# Patient Record
Sex: Female | Born: 1970
Health system: Southern US, Community
[De-identification: ages and names within clinical notes are randomized; demographics above are authoritative.]

## PROBLEM LIST (undated history)

## (undated) ENCOUNTER — Emergency Department (HOSPITAL_COMMUNITY): Admission: EM | Payer: Commercial Managed Care - PPO

## (undated) DIAGNOSIS — Z8639 Personal history of other endocrine, nutritional and metabolic disease: Secondary | ICD-10-CM

## (undated) DIAGNOSIS — F32A Depression, unspecified: Secondary | ICD-10-CM

## (undated) DIAGNOSIS — C786 Secondary malignant neoplasm of retroperitoneum and peritoneum: Secondary | ICD-10-CM

## (undated) DIAGNOSIS — E119 Type 2 diabetes mellitus without complications: Secondary | ICD-10-CM

## (undated) DIAGNOSIS — J189 Pneumonia, unspecified organism: Secondary | ICD-10-CM

## (undated) DIAGNOSIS — F419 Anxiety disorder, unspecified: Secondary | ICD-10-CM

## (undated) DIAGNOSIS — G4730 Sleep apnea, unspecified: Secondary | ICD-10-CM

## (undated) DIAGNOSIS — I10 Essential (primary) hypertension: Secondary | ICD-10-CM

## (undated) DIAGNOSIS — J069 Acute upper respiratory infection, unspecified: Secondary | ICD-10-CM

## (undated) DIAGNOSIS — Z8052 Family history of malignant neoplasm of bladder: Secondary | ICD-10-CM

## (undated) DIAGNOSIS — Z803 Family history of malignant neoplasm of breast: Secondary | ICD-10-CM

## (undated) DIAGNOSIS — I499 Cardiac arrhythmia, unspecified: Secondary | ICD-10-CM

## (undated) DIAGNOSIS — R0600 Dyspnea, unspecified: Secondary | ICD-10-CM

## (undated) DIAGNOSIS — C801 Malignant (primary) neoplasm, unspecified: Secondary | ICD-10-CM

## (undated) DIAGNOSIS — R000 Tachycardia, unspecified: Secondary | ICD-10-CM

## (undated) HISTORY — PX: KNEE SURGERY: SHX244

## (undated) HISTORY — DX: Personal history of other endocrine, nutritional and metabolic disease: Z86.39

## (undated) HISTORY — PX: TONSILLECTOMY AND ADENOIDECTOMY: SHX28

## (undated) HISTORY — DX: Essential (primary) hypertension: I10

## (undated) HISTORY — DX: Family history of malignant neoplasm of bladder: Z80.52

## (undated) HISTORY — DX: Type 2 diabetes mellitus without complications: E11.9

## (undated) HISTORY — DX: Family history of malignant neoplasm of breast: Z80.3

## (undated) HISTORY — DX: Secondary malignant neoplasm of retroperitoneum and peritoneum: C78.6

## (undated) HISTORY — DX: Malignant (primary) neoplasm, unspecified: C80.1

## (undated) MED FILL — Dexamethasone Sodium Phosphate Inj 100 MG/10ML: INTRAMUSCULAR | Qty: 1 | Status: AC

## (undated) MED FILL — Fosaprepitant Dimeglumine For IV Infusion 150 MG (Base Eq): INTRAVENOUS | Qty: 5 | Status: AC

---
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
## 2007-07-18 ENCOUNTER — Ambulatory Visit: Admission: RE | Admit: 2007-07-18 | Discharge: 2007-07-18 | Payer: Self-pay | Admitting: Family Medicine

## 2010-10-17 NOTE — Procedures (Signed)
NAMECARLIYAH, Nicole Santos                   ACCOUNT NO.:  [id number]   MEDICAL RECORD NO.:  [id number]          PATIENT TYPE:  OUT   LOCATION:  SLEE                          FACILITY:  [facility]   PHYSICIAN:  Kofi A. Gerilyn Pilgrim, M.D. DATE OF BIRTH:  01-08-1971   DATE OF PROCEDURE:  07/18/2007  DATE OF DISCHARGE:  07/18/2007                             SLEEP DISORDER REPORT   POLYSOMNOGRAPHY REPORT   REFERRING PHYSICIAN:  Jeoffrey Massed, M.D.   HISTORY:  This is a 40 year old lady who is being evaluated for  obstructive sleep apnea syndrome. Epworth sleepiness scale 9.   MEDICATIONS:  1. Aspirin.  2. Ranitidine.  3. Lisinopril.   SLEEP SUMMARY:  The total recording time is 428 minutes. Total sleep  time 368 minutes with a sleep efficiency of 86%. Stage N1 2.7%, stage N2  46.7%, stage N3 29%, and stage REM 21%.   RESPIRATORY SUMMARY:  The baseline saturation is 97% with the lowest  saturation being 78%. The patient had an overall AHI of 20.9 with more  events seen during REM sleep and later part of the night. The REM AHI is  65 while the non-REM AHI is 8.9.   LIMB MOVEMENT SUMMARY:  The PLM index is 4.6.   ELECTROCARDIOGRAM SUMMARY:  The mean heart rate is 89 with sinus  tachycardia noted.   IMPRESSION:  Moderate obstructive sleep apnea syndrome, worse during REM  sleep.   RECOMMENDATIONS:  Formal titration study to assess optimal CPAP  pressure.   Thanks for this referral.      Kofi A. Gerilyn Pilgrim, M.D.  Electronically Signed    KAD/MEDQ  D:  07/21/2007  T:  07/22/2007  Job:  [id number]

## 2010-10-17 NOTE — Procedures (Signed)
NAMETRACHELLE, LOW                   ACCOUNT NO.:  [id number]   MEDICAL RECORD NO.:  [id number]          PATIENT TYPE:  OUT   LOCATION:  SLEE                          FACILITY:  [facility]   PHYSICIAN:  Kofi A. Gerilyn Pilgrim, M.D. DATE OF BIRTH:  Feb 15, 1971   DATE OF PROCEDURE:  07/18/2007  DATE OF DISCHARGE:  07/18/2007                             SLEEP DISORDER REPORT   POLYSOMNOGRAPHY REPORT   REFERRING PHYSICIAN:  Jeoffrey Massed, M.D.   HISTORY:  This is a 40 year old lady who is being evaluated for  obstructive sleep apnea syndrome. Epworth sleepiness scale 9.   MEDICATIONS:  1. Aspirin.  2. Ranitidine.  3. Lisinopril.   SLEEP SUMMARY:  The total recording time is 428 minutes. Total sleep  time 368 minutes with a sleep efficiency of 86%. Stage N1 2.7%, stage N2  46.7%, stage N3 29%, and stage REM 21%.   RESPIRATORY SUMMARY:  The baseline saturation is 97% with the lowest  saturation being 78%. The patient had an overall AHI of 20.9 with more  events seen during REM sleep and later part of the night. The REM AHI is  65 while the non-REM AHI is 8.9.   LIMB MOVEMENT SUMMARY:  The PLM index is 4.6.   ELECTROCARDIOGRAM SUMMARY:  The mean heart rate is 89 with sinus  tachycardia noted.   IMPRESSION:  Moderate obstructive sleep apnea syndrome, worse during REM  sleep.   RECOMMENDATIONS:  Formal titration study to assess optimal CPAP  pressure.   Thanks for this referral.      Kofi A. Gerilyn Pilgrim, M.D.     KAD/MEDQ  D:  07/21/2007  T:  07/22/2007  Job:  [id number]

## 2012-08-19 ENCOUNTER — Telehealth: Payer: Self-pay | Admitting: Family Medicine

## 2012-08-19 NOTE — Telephone Encounter (Signed)
NEEDS X-RAYS TO PICK UP TO TAKE TO [facility]. BY 1:00 PM TODAY.

## 2013-08-12 ENCOUNTER — Encounter: Payer: Self-pay | Admitting: General Practice

## 2013-08-12 ENCOUNTER — Telehealth: Payer: Self-pay | Admitting: Nurse Practitioner

## 2013-08-12 ENCOUNTER — Encounter (INDEPENDENT_AMBULATORY_CARE_PROVIDER_SITE_OTHER): Payer: Self-pay

## 2013-08-12 ENCOUNTER — Ambulatory Visit (INDEPENDENT_AMBULATORY_CARE_PROVIDER_SITE_OTHER): Payer: 59 | Admitting: General Practice

## 2013-08-12 VITALS — BP 156/91 | HR 106 | Temp 99.7°F | Ht 62.0 in | Wt 265.0 lb

## 2013-08-12 DIAGNOSIS — R059 Cough, unspecified: Secondary | ICD-10-CM

## 2013-08-12 DIAGNOSIS — R05 Cough: Secondary | ICD-10-CM

## 2013-08-12 DIAGNOSIS — J209 Acute bronchitis, unspecified: Secondary | ICD-10-CM

## 2013-08-12 DIAGNOSIS — R062 Wheezing: Secondary | ICD-10-CM

## 2013-08-12 MED ORDER — AMOXICILLIN 500 MG PO CAPS: 500.0000 mg | ORAL_CAPSULE | Freq: Two times a day (BID) | ORAL | Status: DC

## 2013-08-12 MED ORDER — METHYLPREDNISOLONE ACETATE 80 MG/ML IJ SUSP
80.0000 mg | Freq: Once | INTRAMUSCULAR | Status: AC
  Administered 2013-08-12: 80 mg via INTRAMUSCULAR

## 2013-08-12 MED ORDER — PREDNISONE (PAK) 10 MG PO TABS: ORAL_TABLET | ORAL | Status: DC

## 2013-08-12 MED ORDER — BENZONATATE 100 MG PO CAPS: 100.0000 mg | ORAL_CAPSULE | Freq: Three times a day (TID) | ORAL | Status: DC | PRN

## 2013-08-12 MED ORDER — ALBUTEROL SULFATE (2.5 MG/3ML) 0.083% IN NEBU
2.5000 mg | INHALATION_SOLUTION | Freq: Once | RESPIRATORY_TRACT | Status: AC
  Administered 2013-08-12: 2.5 mg via RESPIRATORY_TRACT

## 2013-08-12 NOTE — Patient Instructions (Addendum)
Acute Bronchitis Bronchitis is inflammation of the airways that extend from the windpipe into the lungs (bronchi). The inflammation often causes mucus to develop. This leads to a cough, which is the most common symptom of bronchitis.  In acute bronchitis, the condition usually develops suddenly and goes away over time, usually in a couple weeks. Smoking, allergies, and asthma can make bronchitis worse. Repeated episodes of bronchitis may cause further lung problems.  CAUSES Acute bronchitis is most often caused by the same virus that causes a cold. The virus can spread from person to person (contagious).  SIGNS AND SYMPTOMS   Cough.   Fever.   Coughing up mucus.   Body aches.   Chest congestion.   Chills.   Shortness of breath.   Sore throat.  DIAGNOSIS  Acute bronchitis is usually diagnosed through a physical exam. Tests, such as chest X-rays, are sometimes done to rule out other conditions.  TREATMENT  Acute bronchitis usually goes away in a couple weeks. Often times, no medical treatment is necessary. Medicines are sometimes given for relief of fever or cough. Antibiotics are usually not needed but may be prescribed in certain situations. In some cases, an inhaler may be recommended to help reduce shortness of breath and control the cough. A cool mist vaporizer may also be used to help thin bronchial secretions and make it easier to clear the chest.  HOME CARE INSTRUCTIONS  Get plenty of rest.   Drink enough fluids to keep your urine clear or pale yellow (unless you have a medical condition that requires fluid restriction). Increasing fluids may help thin your secretions and will prevent dehydration.   Only take over-the-counter or prescription medicines as directed by your health care provider.   Avoid smoking and secondhand smoke. Exposure to cigarette smoke or irritating chemicals will make bronchitis worse. If you are a smoker, consider using nicotine gum or skin  patches to help control withdrawal symptoms. Quitting smoking will help your lungs heal faster.   Reduce the chances of another bout of acute bronchitis by washing your hands frequently, avoiding people with cold symptoms, and trying not to touch your hands to your mouth, nose, or eyes.   Follow up with your health care provider as directed.  SEEK MEDICAL CARE IF: Your symptoms do not improve after 1 week of treatment.  SEEK IMMEDIATE MEDICAL CARE IF:  You develop an increased fever or chills.   You have chest pain.   You have severe shortness of breath.  You have bloody sputum.   You develop dehydration.  You develop fainting.  You develop repeated vomiting.  You develop a severe headache. MAKE SURE YOU:   Understand these instructions.  Will watch your condition.  Will get help right away if you are not doing well or get worse. Document Released: 06/28/2004 Document Revised: 01/21/2013 Document Reviewed: 11/11/2012 [employer] Patient Information 2014 [employer].  

## 2013-08-12 NOTE — Telephone Encounter (Signed)
appt made

## 2013-08-14 NOTE — Progress Notes (Signed)
   Subjective:    Patient ID: Nicole Santos, female    DOB: 05-07-1971, 43 y.o.   MRN: [id number]  Cough This is a new problem. The current episode started in the past 7 days. The problem has been gradually worsening. The cough is non-productive. Associated symptoms include a fever and wheezing. Pertinent negatives include no chest pain, chills, nasal congestion, postnasal drip, sore throat or shortness of breath. The symptoms are aggravated by lying down. She has tried nothing for the symptoms. There is no history of asthma, bronchitis or pneumonia.      Review of Systems  Constitutional: Positive for fever. Negative for chills.  HENT: Negative for postnasal drip and sore throat.   Respiratory: Positive for cough and wheezing. Negative for shortness of breath.   Cardiovascular: Negative for chest pain and palpitations.       Objective:   Physical Exam  Constitutional: She appears well-developed and well-nourished.  Cardiovascular: Normal rate, regular rhythm and normal heart sounds.   Pulmonary/Chest: Effort normal. She has wheezes in the right upper field and the left upper field. She exhibits no tenderness.          Assessment & Plan:  1. Wheezing  - albuterol (PROVENTIL) (2.5 MG/3ML) 0.083% nebulizer solution 2.5 mg; Take 3 mLs (2.5 mg total) by nebulization once. -improvement in wheezing after neb treatment 2. Acute bronchitis  - methylPREDNISolone acetate (DEPO-MEDROL) injection 80 mg; Inject 1 mL (80 mg total) into the muscle once. - predniSONE (STERAPRED UNI-PAK) 10 MG tablet; Start on 08/13/13  Dispense: 21 tablet; Refill: 0  3. Cough  - benzonatate (TESSALON) 100 MG capsule; Take 1 capsule (100 mg total) by mouth 3 (three) times daily as needed.  Dispense: 30 capsule; Refill: 0 -avoid irritants -RTO if symptoms worsen or unresolved Patient verbalized understanding Erby Pian, FNP-C

## 2013-10-20 ENCOUNTER — Encounter: Payer: Self-pay | Admitting: Physician Assistant

## 2013-10-20 ENCOUNTER — Ambulatory Visit (INDEPENDENT_AMBULATORY_CARE_PROVIDER_SITE_OTHER): Payer: 59 | Admitting: Physician Assistant

## 2013-10-20 VITALS — BP 114/75 | HR 116 | Temp 99.1°F | Wt 269.8 lb

## 2013-10-20 DIAGNOSIS — I872 Venous insufficiency (chronic) (peripheral): Secondary | ICD-10-CM

## 2013-10-20 DIAGNOSIS — C539 Malignant neoplasm of cervix uteri, unspecified: Secondary | ICD-10-CM

## 2013-10-20 DIAGNOSIS — I8310 Varicose veins of unspecified lower extremity with inflammation: Secondary | ICD-10-CM

## 2013-10-20 MED ORDER — CEPHALEXIN 500 MG PO CAPS: 500.0000 mg | ORAL_CAPSULE | Freq: Two times a day (BID) | ORAL | Status: DC

## 2013-10-20 NOTE — Progress Notes (Signed)
Subjective:     Patient ID: Nicole Santos, female   DOB: 01/24/1971, 43 y.o.   MRN: [id number]  HPI Pt with redness to the L lower leg States this was preceded by vague complaints of general malaise Family concerned about cellulitis Rash is better today She has a job that requires her to stand all day Hx of DVT to the R leg following immobility for cervical Ca tx Denies any drainage from the site No new meds OTC or rx   Review of Systems  Respiratory: Negative for chest tightness, shortness of breath and wheezing.   Cardiovascular: Positive for leg swelling. Negative for chest pain.       Objective:   Physical Exam Heart- RRR w/o M Lungs- CTA Lower ext- trace edema bilat Good pulse noted on the R  Good sensory Sl erythema not to the distal 1/3 medial tibia are No TTP    Assessment:     Lower ext edema with possible stasis derm    Plan:     Discussed this is prob stasis derm Rash prob improved because she was able to get off her feet yest I would like her to try the compression hose that she already has at home Given hx will treat with Keflex 500mg  bid to cover cellulitis F/U prn

## 2014-01-07 ENCOUNTER — Ambulatory Visit (INDEPENDENT_AMBULATORY_CARE_PROVIDER_SITE_OTHER): Payer: 59 | Admitting: Nurse Practitioner

## 2014-01-07 ENCOUNTER — Encounter: Payer: Self-pay | Admitting: Nurse Practitioner

## 2014-01-07 VITALS — BP 110/67 | HR 104 | Temp 98.8°F | Ht 62.0 in | Wt 266.8 lb

## 2014-01-07 DIAGNOSIS — J029 Acute pharyngitis, unspecified: Secondary | ICD-10-CM

## 2014-01-07 LAB — POCT RAPID STREP A (OFFICE): Rapid Strep A Screen: NEGATIVE

## 2014-01-07 MED ORDER — AMOXICILLIN 875 MG PO TABS: 875.0000 mg | ORAL_TABLET | Freq: Two times a day (BID) | ORAL | Status: DC

## 2014-01-07 NOTE — Progress Notes (Signed)
   Subjective:    Patient ID: Nicole Santos, female    DOB: 19-Feb-1971, 43 y.o.   MRN: [id number]  HPI Patient in c/o sore throat- started last weekend- no better- advil OTC- helping some.    Review of Systems  Constitutional: Negative for fever and chills.  HENT: Positive for congestion, sore throat, trouble swallowing and voice change.   Respiratory: Negative for cough.   Cardiovascular: Negative.   Gastrointestinal: Negative.   Musculoskeletal: Negative.   Neurological: Positive for headaches.  Psychiatric/Behavioral: Negative.   All other systems reviewed and are negative.      Objective:   Physical Exam  Constitutional: She is oriented to person, place, and time. She appears well-developed and well-nourished.  HENT:  Right Ear: Hearing, tympanic membrane, external ear and ear canal normal.  Left Ear: Hearing, tympanic membrane, external ear and ear canal normal.  Nose: Mucosal edema and rhinorrhea present. Right sinus exhibits no maxillary sinus tenderness and no frontal sinus tenderness. Left sinus exhibits no maxillary sinus tenderness and no frontal sinus tenderness.  Mouth/Throat: Uvula is midline and mucous membranes are normal. Posterior oropharyngeal edema and posterior oropharyngeal erythema present. No oropharyngeal exudate or tonsillar abscesses.  Neck: Normal range of motion. Neck supple.  Cardiovascular: Normal rate, regular rhythm and normal heart sounds.   Pulmonary/Chest: Effort normal and breath sounds normal.  Abdominal: Soft. Bowel sounds are normal.  Lymphadenopathy:    She has no cervical adenopathy.  Neurological: She is alert and oriented to person, place, and time.  Skin: Skin is warm and dry.  Psychiatric: She has a normal mood and affect. Her behavior is normal. Judgment and thought content normal.   BP 110/67  Pulse 104  Temp(Src) 98.8 F (37.1 C) (Oral)  Ht 5\' 2"  (1.575 m)  Wt 266 lb 12.8 oz (121.02 kg)  BMI 48.79 kg/m2   Results for orders  placed in visit on 01/07/14  POCT RAPID STREP A (OFFICE)      Result Value Ref Range   Rapid Strep A Screen Negative  Negative        Assessment & Plan:   1. Sore throat    Meds ordered this encounter  Medications  . amoxicillin (AMOXIL) 875 MG tablet    Sig: Take 1 tablet (875 mg total) by mouth 2 (two) times daily.    Dispense:  20 tablet    Refill:  0    Order Specific Question:  Supervising Provider    Answer:  Joycelyn Man   Force fluids Motrin or tylenol OTC OTC decongestant Throat lozenges if help New toothbrush in 3 days  Mary-Margaret Hassell Done, FNP

## 2014-01-07 NOTE — Patient Instructions (Signed)
Force fluids °Motrin or tylenol OTC °OTC decongestant °Throat lozenges if help °New toothbrush in 3 days ° °

## 2014-03-15 ENCOUNTER — Encounter: Payer: Self-pay | Admitting: Family

## 2014-03-15 ENCOUNTER — Ambulatory Visit (INDEPENDENT_AMBULATORY_CARE_PROVIDER_SITE_OTHER): Payer: 59 | Admitting: Family

## 2014-03-15 VITALS — BP 151/91 | HR 109 | Temp 98.2°F | Ht 62.0 in | Wt 263.0 lb

## 2014-03-15 DIAGNOSIS — N390 Urinary tract infection, site not specified: Secondary | ICD-10-CM

## 2014-03-15 DIAGNOSIS — R350 Frequency of micturition: Secondary | ICD-10-CM

## 2014-03-15 DIAGNOSIS — IMO0001 Reserved for inherently not codable concepts without codable children: Secondary | ICD-10-CM

## 2014-03-15 DIAGNOSIS — R319 Hematuria, unspecified: Secondary | ICD-10-CM

## 2014-03-15 LAB — POCT UA - MICROSCOPIC ONLY
CASTS, UR, LPF, POC: NEGATIVE
CRYSTALS, UR, HPF, POC: NEGATIVE
Mucus, UA: NEGATIVE
Yeast, UA: NEGATIVE

## 2014-03-15 LAB — POCT URINALYSIS DIPSTICK

## 2014-03-15 MED ORDER — NITROFURANTOIN MONOHYD MACRO 100 MG PO CAPS: 100.0000 mg | ORAL_CAPSULE | Freq: Two times a day (BID) | ORAL | Status: DC

## 2014-03-15 NOTE — Patient Instructions (Signed)

## 2014-03-15 NOTE — Progress Notes (Signed)
Subjective:    Patient ID: Nicole Santos, female    DOB: Aug 09, 1970, 43 y.o.   MRN: [id number]  Urinary Tract Infection  This is a new problem. The current episode started in the past 7 days (Saturday). The problem occurs every urination. The problem has been gradually worsening. The quality of the pain is described as burning. The pain is at a severity of 6/10. The pain is mild. There has been no fever. Associated symptoms include frequency, hematuria and urgency. Pertinent negatives include no discharge, flank pain, hesitancy, nausea or vomiting. She has tried increased fluids (AZO) for the symptoms. The treatment provided mild relief.      Review of Systems  Constitutional: Negative.   HENT: Negative.   Eyes: Negative.   Respiratory: Negative.  Negative for shortness of breath.   Cardiovascular: Negative.  Negative for palpitations.  Gastrointestinal: Negative.  Negative for nausea and vomiting.  Endocrine: Negative.   Genitourinary: Positive for urgency, frequency and hematuria. Negative for hesitancy and flank pain.  Musculoskeletal: Negative.   Neurological: Negative.  Negative for headaches.  Hematological: Negative.   Psychiatric/Behavioral: Negative.   All other systems reviewed and are negative.      Objective:   Physical Exam  Vitals reviewed. Constitutional: She is oriented to person, place, and time. She appears well-developed and well-nourished. No distress.  Eyes: Pupils are equal, round, and reactive to light.  Neck: Normal range of motion. Neck supple. No thyromegaly present.  Cardiovascular: Normal rate, regular rhythm, normal heart sounds and intact distal pulses.   No murmur heard. Pulmonary/Chest: Effort normal and breath sounds normal. No respiratory distress. She has no wheezes.  Abdominal: Soft. Bowel sounds are normal. She exhibits no distension. There is no tenderness.  Musculoskeletal: Normal range of motion. She exhibits no edema and no tenderness.    Negative for CVA tenderness   Neurological: She is alert and oriented to person, place, and time. She has normal reflexes. No cranial nerve deficit.  Skin: Skin is warm and dry.  Psychiatric: She has a normal mood and affect. Her behavior is normal. Judgment and thought content normal.   Results for orders placed in visit on 03/15/14  POCT URINALYSIS DIPSTICK      Result Value Ref Range   Color, UA RED     Clarity, UA CLOUDY     Glucose, UA       Bilirubin, UA       Ketones, UA       Spec Grav, UA       Blood, UA       pH, UA       Protein, UA       Urobilinogen, UA       Nitrite, UA       Leukocytes, UA      POCT UA - MICROSCOPIC ONLY      Result Value Ref Range   WBC, Ur, HPF, POC 15-20     RBC, urine, microscopic 15-20     Bacteria, U Microscopic RARE     Mucus, UA NEG     Epithelial cells, urine per micros OCC     Crystals, Ur, HPF, POC NEG     Casts, Ur, LPF, POC NEG     Yeast, UA NEG       BP 151/91  Pulse 109  Temp(Src) 98.2 F (36.8 C) (Oral)  Ht 5\' 2"  (1.575 m)  Wt 263 lb (119.296 kg)  BMI 48.09 kg/m2  LMP 10/02/2000  Assessment & Plan:  1. Frequency - POCT urinalysis dipstick - POCT UA - Microscopic Only  2. Urinary tract infection with hematuria, site unspecified Force fluids AZO over the counter X2 days RTO prn  Evelina Dun, FNP

## 2014-03-15 NOTE — Addendum Note (Signed)
Addended by: Selmer Dominion on: 03/15/2014 09:59 AM   Modules accepted: Orders

## 2014-03-17 LAB — URINE CULTURE

## 2014-03-22 ENCOUNTER — Telehealth: Payer: Self-pay | Admitting: *Deleted

## 2014-03-22 NOTE — Telephone Encounter (Signed)
Aware. 

## 2016-07-04 ENCOUNTER — Encounter: Payer: Self-pay | Admitting: Physician Assistant

## 2016-07-04 ENCOUNTER — Ambulatory Visit (INDEPENDENT_AMBULATORY_CARE_PROVIDER_SITE_OTHER): Payer: Commercial Managed Care - PPO | Admitting: Physician Assistant

## 2016-07-04 VITALS — BP 173/103 | HR 122 | Temp 96.8°F | Ht 62.0 in | Wt 292.0 lb

## 2016-07-04 DIAGNOSIS — M5432 Sciatica, left side: Secondary | ICD-10-CM | POA: Diagnosis not present

## 2016-07-04 DIAGNOSIS — I10 Essential (primary) hypertension: Secondary | ICD-10-CM

## 2016-07-04 MED ORDER — LISINOPRIL 10 MG PO TABS: 10.0000 mg | ORAL_TABLET | Freq: Every day | ORAL | 3 refills | Status: DC

## 2016-07-04 MED ORDER — CYCLOBENZAPRINE HCL 10 MG PO TABS: 10.0000 mg | ORAL_TABLET | Freq: Three times a day (TID) | ORAL | 0 refills | Status: DC | PRN

## 2016-07-04 MED ORDER — PREDNISONE 10 MG (21) PO TBPK: ORAL_TABLET | ORAL | 0 refills | Status: DC

## 2016-07-04 NOTE — Patient Instructions (Signed)
Sciatica  Sciatica Sciatica is pain, numbness, weakness, or tingling along the path of the sciatic nerve. The sciatic nerve starts in the lower back and runs down the back of each leg. The nerve controls the muscles in the lower leg and in the back of the knee. It also provides feeling (sensation) to the back of the thigh, the lower leg, and the sole of the foot. Sciatica is a symptom of another medical condition that pinches or puts pressure on the sciatic nerve. Generally, sciatica only affects one side of the body. Sciatica usually goes away on its own or with treatment. In some cases, sciatica may keep coming back (recur). What are the causes? This condition is caused by pressure on the sciatic nerve, or pinching of the sciatic nerve. This may be the result of:  A disk in between the bones of the spine (vertebrae) bulging out too far (herniated disk).  Age-related changes in the spinal disks (degenerative disk disease).  A pain disorder that affects a muscle in the buttock (piriformis syndrome).  Extra bone growth (bone spur) near the sciatic nerve.  An injury or break (fracture) of the pelvis.  Pregnancy.  Tumor (rare). What increases the risk? The following factors may make you more likely to develop this condition:  Playing sports that place pressure or stress on the spine, such as football or weight lifting.  Having poor strength and flexibility.  A history of back injury.  A history of back surgery.  Sitting for long periods of time.  Doing activities that involve repetitive bending or lifting.  Obesity. What are the signs or symptoms? Symptoms can vary from mild to very severe, and they may include:  Any of these problems in the lower back, leg, hip, or buttock:  Mild tingling or dull aches.  Burning sensations.  Sharp pains.  Numbness in the back of the calf or the sole of the foot.  Leg weakness.  Severe back pain that makes movement difficult. These  symptoms may get worse when you cough, sneeze, or laugh, or when you sit or stand for long periods of time. Being overweight may also make symptoms worse. In some cases, symptoms may recur over time. How is this diagnosed? This condition may be diagnosed based on:  Your symptoms.  A physical exam. Your health care provider may ask you to do certain movements to check whether those movements trigger your symptoms.  You may have tests, including:  Blood tests.  X-rays.  MRI.  CT scan. How is this treated? In many cases, this condition improves on its own, without any treatment. However, treatment may include:  Reducing or modifying physical activity during periods of pain.  Exercising and stretching to strengthen your abdomen and improve the flexibility of your spine.  Icing and applying heat to the affected area.  Medicines that help:  To relieve pain and swelling.  To relax your muscles.  Injections of medicines that help to relieve pain, irritation, and inflammation around the sciatic nerve (steroids).  Surgery. Follow these instructions at home: Medicines  Take over-the-counter and prescription medicines only as told by your health care provider.  Do not drive or operate heavy machinery while taking prescription pain medicine. Managing pain  If directed, apply ice to the affected area.  Put ice in a plastic bag.  Place a towel between your skin and the bag.  Leave the ice on for 20 minutes, 2-3 times a day.  After icing, apply heat to the  affected area before you exercise or as often as told by your health care provider. Use the heat source that your health care provider recommends, such as a moist heat pack or a heating pad.  Place a towel between your skin and the heat source.  Leave the heat on for 20-30 minutes.  Remove the heat if your skin turns bright red. This is especially important if you are unable to feel pain, heat, or cold. You may have a  greater risk of getting burned. Activity  Return to your normal activities as told by your health care provider. Ask your health care provider what activities are safe for you.  Avoid activities that make your symptoms worse.  Take brief periods of rest throughout the day. Resting in a lying or standing position is usually better than sitting to rest.  When you rest for longer periods, mix in some mild activity or stretching between periods of rest. This will help to prevent stiffness and pain.  Avoid sitting for long periods of time without moving. Get up and move around at least one time each hour.  Exercise and stretch regularly, as told by your health care provider.  Do not lift anything that is heavier than 10 lb (4.5 kg) while you have symptoms of sciatica. When you do not have symptoms, you should still avoid heavy lifting, especially repetitive heavy lifting.  When you lift objects, always use proper lifting technique, which includes:  Bending your knees.  Keeping the load close to your body.  Avoiding twisting. General instructions  Use good posture.  Avoid leaning forward while sitting.  Avoid hunching over while standing.  Maintain a healthy weight. Excess weight puts extra stress on your back and makes it difficult to maintain good posture.  Wear supportive, comfortable shoes. Avoid wearing high heels.  Avoid sleeping on a mattress that is too soft or too hard. A mattress that is firm enough to support your back when you sleep may help to reduce your pain.  Keep all follow-up visits as told by your health care provider. This is important. Contact a health care provider if:  You have pain that wakes you up when you are sleeping.  You have pain that gets worse when you lie down.  Your pain is worse than you have experienced in the past.  Your pain lasts longer than 4 weeks.  You experience unexplained weight loss. Get help right away if:  You lose control  of your bowel or bladder (incontinence).  You have:  Weakness in your lower back, pelvis, buttocks, or legs that gets worse.  Redness or swelling of your back.  A burning sensation when you urinate. This information is not intended to replace advice given to you by your health care provider. Make sure you discuss any questions you have with your health care provider. Document Released: 05/15/2001 Document Revised: 10/25/2015 Document Reviewed: 01/28/2015 Elsevier Interactive Patient Education  2017 [employer].

## 2016-07-08 NOTE — Progress Notes (Signed)
BP (!) 173/103   Pulse (!) 122   Temp (!) 96.8 F (36 C) (Oral)   Ht 5\' 2"  (1.575 m)   Wt 292 lb (132.5 kg)   BMI 53.41 kg/m    Subjective:    Patient ID: Nicole Santos, female    DOB: 03/19/1971, 46 y.o.   MRN: [id number]  HPI: Nicole Santos is a 46 y.o. female presenting on 07/04/2016 for back pain down to ankle  This patient comes in for periodic recheck on medications and conditions. All medications are reviewed today. There are no reports of any problems with the medications. All of the medical conditions are reviewed and updated.  Lab work is reviewed and will be ordered as medically necessary. There are no new problems reported with today's visit.  Leg has been hurting more, from the low back to the foot. Some old history of this.   Relevant past medical, surgical, family and social history reviewed and updated as indicated. Allergies and medications reviewed and updated.  Past Medical History:  Diagnosis Date  . Cancer [facility])     Past Surgical History:  Procedure Laterality Date  . KNEE SURGERY Right    Arthroscopic    Review of Systems  Allergies as of 07/04/2016      Reactions   Doxycycline    Abdominal cramps.      Medication List       Accurate as of 07/04/16 11:59 PM. Always use your most recent med list.          aspirin EC 81 MG tablet Take 81 mg by mouth daily.   cyclobenzaprine 10 MG tablet Commonly known as:  FLEXERIL Take 1 tablet (10 mg total) by mouth 3 (three) times daily as needed for muscle spasms.   lisinopril 10 MG tablet Commonly known as:  PRINIVIL,ZESTRIL Take 1 tablet (10 mg total) by mouth daily.   predniSONE 10 MG (21) Tbpk tablet Commonly known as:  STERAPRED UNI-PAK 21 TAB As directed x 6 days   ranitidine 150 MG tablet Commonly known as:  ZANTAC Take 150 mg by mouth 2 (two) times daily. Takes PRN,  OTC          Objective:    BP (!) 173/103   Pulse (!) 122   Temp (!) 96.8 F (36 C) (Oral)   Ht 5\' 2"  (1.575 m)    Wt 292 lb (132.5 kg)   BMI 53.41 kg/m   Allergies  Allergen Reactions  . Doxycycline     Abdominal cramps.    Physical Exam  Results for orders placed or performed in visit on 03/15/14  Urine culture  Result Value Ref Range   Urine Culture, Routine Final report (A)    Urine Culture result 1 Escherichia coli (A)    ANTIMICROBIAL SUSCEPTIBILITY Comment   POCT urinalysis dipstick  Result Value Ref Range   Color, UA RED    Clarity, UA CLOUDY    Glucose, UA     Bilirubin, UA     Ketones, UA     Spec Grav, UA     Blood, UA     pH, UA     Protein, UA     Urobilinogen, UA     Nitrite, UA     Leukocytes, UA    POCT UA - Microscopic Only  Result Value Ref Range   WBC, Ur, HPF, POC 15-20    RBC, urine, microscopic 15-20    Bacteria, U Microscopic RARE  Mucus, UA NEG    Epithelial cells, urine per micros OCC    Crystals, Ur, HPF, POC NEG    Casts, Ur, LPF, POC NEG    Yeast, UA NEG       Assessment & Plan:   1. Sciatica of left side - cyclobenzaprine (FLEXERIL) 10 MG tablet; Take 1 tablet (10 mg total) by mouth 3 (three) times daily as needed for muscle spasms.  Dispense: 30 tablet; Refill: 0 - predniSONE (STERAPRED UNI-PAK 21 TAB) 10 MG (21) TBPK tablet; As directed x 6 days  Dispense: 21 tablet; Refill: 0  2. Essential hypertension - lisinopril (PRINIVIL,ZESTRIL) 10 MG tablet; Take 1 tablet (10 mg total) by mouth daily.  Dispense: 90 tablet; Refill: 3   Continue all other maintenance medications as listed above.  Follow up plan: Return in about 4 weeks (around 08/01/2016).  No orders of the defined types were placed in this encounter.   Educational handout given for sciatica  Terald Sleeper PA-C [facility] [address]  [place], [place] [address] [phone]   07/08/2016, 9:04 PM

## 2016-08-01 ENCOUNTER — Ambulatory Visit (INDEPENDENT_AMBULATORY_CARE_PROVIDER_SITE_OTHER): Payer: Commercial Managed Care - PPO | Admitting: Physician Assistant

## 2016-08-01 ENCOUNTER — Encounter: Payer: Self-pay | Admitting: Physician Assistant

## 2016-08-01 VITALS — BP 146/90 | HR 102 | Temp 97.6°F | Ht 62.0 in | Wt 294.2 lb

## 2016-08-01 DIAGNOSIS — Z1231 Encounter for screening mammogram for malignant neoplasm of breast: Secondary | ICD-10-CM

## 2016-08-01 DIAGNOSIS — I10 Essential (primary) hypertension: Secondary | ICD-10-CM | POA: Diagnosis not present

## 2016-08-01 DIAGNOSIS — R0609 Other forms of dyspnea: Secondary | ICD-10-CM

## 2016-08-01 DIAGNOSIS — N644 Mastodynia: Secondary | ICD-10-CM | POA: Diagnosis not present

## 2016-08-01 NOTE — Patient Instructions (Signed)
DASH Eating Plan DASH stands for "Dietary Approaches to Stop Hypertension." The DASH eating plan is a healthy eating plan that has been shown to reduce high blood pressure (hypertension). It may also reduce your risk for type 2 diabetes, heart disease, and stroke. The DASH eating plan may also help with weight loss. What are tips for following this plan? General guidelines  Avoid eating more than 2,300 mg (milligrams) of salt (sodium) a day. If you have hypertension, you may need to reduce your sodium intake to 1,500 mg a day.  Limit alcohol intake to no more than 1 drink a day for nonpregnant women and 2 drinks a day for men. One drink equals 12 oz of beer, 5 oz of wine, or 1 oz of hard liquor.  Work with your health care provider to maintain a healthy body weight or to lose weight. Ask what an ideal weight is for you.  Get at least 30 minutes of exercise that causes your heart to beat faster (aerobic exercise) most days of the week. Activities may include walking, swimming, or biking.  Work with your health care provider or diet and nutrition specialist (dietitian) to adjust your eating plan to your individual calorie needs. Reading food labels  Check food labels for the amount of sodium per serving. Choose foods with less than 5 percent of the Daily Value of sodium. Generally, foods with less than 300 mg of sodium per serving fit into this eating plan.  To find whole grains, look for the word "whole" as the first word in the ingredient list. Shopping  Buy products labeled as "low-sodium" or "no salt added."  Buy fresh foods. Avoid canned foods and premade or frozen meals. Cooking  Avoid adding salt when cooking. Use salt-free seasonings or herbs instead of table salt or sea salt. Check with your health care provider or pharmacist before using salt substitutes.  Do not fry foods. Cook foods using healthy methods such as baking, boiling, grilling, and broiling instead.  Cook with  heart-healthy oils, such as olive, canola, soybean, or sunflower oil. Meal planning   Eat a balanced diet that includes: ? 5 or more servings of fruits and vegetables each day. At each meal, try to fill half of your plate with fruits and vegetables. ? Up to 6-8 servings of whole grains each day. ? Less than 6 oz of lean meat, poultry, or fish each day. A 3-oz serving of meat is about the same size as a deck of cards. One egg equals 1 oz. ? 2 servings of low-fat dairy each day. ? A serving of nuts, seeds, or beans 5 times each week. ? Heart-healthy fats. Healthy fats called Omega-3 fatty acids are found in foods such as flaxseeds and coldwater fish, like sardines, salmon, and mackerel.  Limit how much you eat of the following: ? Canned or prepackaged foods. ? Food that is high in trans fat, such as fried foods. ? Food that is high in saturated fat, such as fatty meat. ? Sweets, desserts, sugary drinks, and other foods with added sugar. ? Full-fat dairy products.  Do not salt foods before eating.  Try to eat at least 2 vegetarian meals each week.  Eat more home-cooked food and less restaurant, buffet, and fast food.  When eating at a restaurant, ask that your food be prepared with less salt or no salt, if possible. What foods are recommended? The items listed may not be a complete list. Talk with your dietitian about what   dietary choices are best for you. Grains Whole-grain or whole-wheat bread. Whole-grain or whole-wheat pasta. Brown rice. Oatmeal. Quinoa. Bulgur. Whole-grain and low-sodium cereals. Pita bread. Low-fat, low-sodium crackers. Whole-wheat flour tortillas. Vegetables Fresh or frozen vegetables (raw, steamed, roasted, or grilled). Low-sodium or reduced-sodium tomato and vegetable juice. Low-sodium or reduced-sodium tomato sauce and tomato paste. Low-sodium or reduced-sodium canned vegetables. Fruits All fresh, dried, or frozen fruit. Canned fruit in natural juice (without  added sugar). Meat and other protein foods Skinless chicken or turkey. Ground chicken or turkey. Pork with fat trimmed off. Fish and seafood. Egg whites. Dried beans, peas, or lentils. Unsalted nuts, nut butters, and seeds. Unsalted canned beans. Lean cuts of beef with fat trimmed off. Low-sodium, lean deli meat. Dairy Low-fat (1%) or fat-free (skim) milk. Fat-free, low-fat, or reduced-fat cheeses. Nonfat, low-sodium ricotta or cottage cheese. Low-fat or nonfat yogurt. Low-fat, low-sodium cheese. Fats and oils Soft margarine without trans fats. Vegetable oil. Low-fat, reduced-fat, or light mayonnaise and salad dressings (reduced-sodium). Canola, safflower, olive, soybean, and sunflower oils. Avocado. Seasoning and other foods Herbs. Spices. Seasoning mixes without salt. Unsalted popcorn and pretzels. Fat-free sweets. What foods are not recommended? The items listed may not be a complete list. Talk with your dietitian about what dietary choices are best for you. Grains Baked goods made with fat, such as croissants, muffins, or some breads. Dry pasta or rice meal packs. Vegetables Creamed or fried vegetables. Vegetables in a cheese sauce. Regular canned vegetables (not low-sodium or reduced-sodium). Regular canned tomato sauce and paste (not low-sodium or reduced-sodium). Regular tomato and vegetable juice (not low-sodium or reduced-sodium). Pickles. Olives. Fruits Canned fruit in a light or heavy syrup. Fried fruit. Fruit in cream or butter sauce. Meat and other protein foods Fatty cuts of meat. Ribs. Fried meat. Bacon. Sausage. Bologna and other processed lunch meats. Salami. Fatback. Hotdogs. Bratwurst. Salted nuts and seeds. Canned beans with added salt. Canned or smoked fish. Whole eggs or egg yolks. Chicken or turkey with skin. Dairy Whole or 2% milk, cream, and half-and-half. Whole or full-fat cream cheese. Whole-fat or sweetened yogurt. Full-fat cheese. Nondairy creamers. Whipped toppings.  Processed cheese and cheese spreads. Fats and oils Butter. Stick margarine. Lard. Shortening. Ghee. Bacon fat. Tropical oils, such as coconut, palm kernel, or palm oil. Seasoning and other foods Salted popcorn and pretzels. Onion salt, garlic salt, seasoned salt, table salt, and sea salt. Worcestershire sauce. Tartar sauce. Barbecue sauce. Teriyaki sauce. Soy sauce, including reduced-sodium. Steak sauce. Canned and packaged gravies. Fish sauce. Oyster sauce. Cocktail sauce. Horseradish that you find on the shelf. Ketchup. Mustard. Meat flavorings and tenderizers. Bouillon cubes. Hot sauce and Tabasco sauce. Premade or packaged marinades. Premade or packaged taco seasonings. Relishes. Regular salad dressings. Where to find more information:  National Heart, Lung, and Blood Institute: www.nhlbi.nih.gov  American Heart Association: www.heart.org Summary  The DASH eating plan is a healthy eating plan that has been shown to reduce high blood pressure (hypertension). It may also reduce your risk for type 2 diabetes, heart disease, and stroke.  With the DASH eating plan, you should limit salt (sodium) intake to 2,300 mg a day. If you have hypertension, you may need to reduce your sodium intake to 1,500 mg a day.  When on the DASH eating plan, aim to eat more fresh fruits and vegetables, whole grains, lean proteins, low-fat dairy, and heart-healthy fats.  Work with your health care provider or diet and nutrition specialist (dietitian) to adjust your eating plan to your individual   calorie needs. This information is not intended to replace advice given to you by your health care provider. Make sure you discuss any questions you have with your health care provider. Document Released: 05/10/2011 Document Revised: 05/14/2016 Document Reviewed: 05/14/2016 Elsevier Interactive Patient Education  2017 Elsevier Inc.  

## 2016-08-06 NOTE — Progress Notes (Signed)
BP (!) 146/90   Pulse (!) 102   Temp 97.6 F (36.4 C) (Oral)   Ht 5\' 2"  (1.575 m)   Wt 294 lb 3.2 oz (133.4 kg)   BMI 53.81 kg/m    Subjective:    Patient ID: Nicole Santos, female    DOB: 07-19-1970, 46 y.o.   MRN: [id number]  HPI: Nicole Santos is a 46 y.o. female presenting on 08/01/2016 for Follow-up (4 week rck HTN)  This patient was seen 4 weeks ago and started with lisinopril. She had not started the medication and on recheck the blood pressure is still the same. She is willing to start the medication now. She states she has at home and will start it.  In reviewing her health maintenance list she is due a mammogram and we will get this scheduled. She also reports that at times she has some dyspnea on exertion. It has not stopped her from doing things but it has become more of a concern due to the fact a friend had cardiac issues at a very young age and had similar symptoms. She can move groceries with slight dyspnea. She is slightly winded when she gets the top of a flight of stairs.  Gets dyspneic in the shower.  Possible there is some reactive airway, but will look into cardiac first.  Relevant past medical, surgical, family and social history reviewed and updated as indicated. Allergies and medications reviewed and updated.  Past Medical History:  Diagnosis Date  . Cancer [facility])     Past Surgical History:  Procedure Laterality Date  . KNEE SURGERY Right    Arthroscopic    Review of Systems  Constitutional: Positive for fatigue. Negative for activity change and fever.  HENT: Negative.   Eyes: Negative.   Respiratory: Positive for shortness of breath. Negative for cough and wheezing.   Cardiovascular: Negative.  Negative for chest pain, palpitations and leg swelling.  Gastrointestinal: Negative.  Negative for abdominal pain.  Endocrine: Negative.   Genitourinary: Negative.  Negative for dysuria.  Musculoskeletal: Negative.   Skin: Negative.   Neurological: Negative.      Allergies as of 08/01/2016      Reactions   Doxycycline    Abdominal cramps.      Medication List       Accurate as of 08/01/16 11:59 PM. Always use your most recent med list.          aspirin EC 81 MG tablet Take 81 mg by mouth daily.   lisinopril 10 MG tablet Commonly known as:  PRINIVIL,ZESTRIL Take 1 tablet (10 mg total) by mouth daily.   ranitidine 150 MG tablet Commonly known as:  ZANTAC Take 150 mg by mouth 2 (two) times daily. Takes PRN,  OTC          Objective:    BP (!) 146/90   Pulse (!) 102   Temp 97.6 F (36.4 C) (Oral)   Ht 5\' 2"  (1.575 m)   Wt 294 lb 3.2 oz (133.4 kg)   BMI 53.81 kg/m   Allergies  Allergen Reactions  . Doxycycline     Abdominal cramps.    Physical Exam  Constitutional: She is oriented to person, place, and time. She appears well-developed and well-nourished.  HENT:  Head: Normocephalic and atraumatic.  Right Ear: Tympanic membrane, external ear and ear canal normal.  Left Ear: Tympanic membrane, external ear and ear canal normal.  Nose: Nose normal. No rhinorrhea.  Mouth/Throat: Oropharynx is clear  and moist and mucous membranes are normal. No oropharyngeal exudate or posterior oropharyngeal erythema.  Eyes: Conjunctivae and EOM are normal. Pupils are equal, round, and reactive to light.  Neck: Normal range of motion. Neck supple.  Cardiovascular: Normal rate, regular rhythm, normal heart sounds and intact distal pulses.   Pulmonary/Chest: Effort normal and breath sounds normal.  Abdominal: Soft. Bowel sounds are normal.  Neurological: She is alert and oriented to person, place, and time. She has normal reflexes.  Skin: Skin is warm and dry. No rash noted.  Psychiatric: She has a normal mood and affect. Her behavior is normal. Judgment and thought content normal.  Nursing note and vitals reviewed.       Assessment & Plan:   1. Essential hypertension - Ambulatory referral to Cardiology Start lisinopril 10mg  one  daily, (has med at home)  2. Dyspnea on exertion - Ambulatory referral to Cardiology  3. Pain of right breast - MM DIGITAL SCREENING BILATERAL; Future  4. Screening mammogram, encounter for - MM DIGITAL SCREENING BILATERAL; Future   Continue all other maintenance medications as listed above.  Follow up plan: Return in about 2 months (around 09/29/2016) for recheck.  Educational handout given for DASH diet information  Terald Sleeper PA-C [facility] [address]  [place], [place] [address] [phone]   08/06/2016, 11:27 AM

## 2016-09-10 ENCOUNTER — Encounter: Payer: Self-pay | Admitting: Cardiology

## 2016-09-10 ENCOUNTER — Other Ambulatory Visit (HOSPITAL_COMMUNITY)
Admission: RE | Admit: 2016-09-10 | Discharge: 2016-09-10 | Disposition: A | Discharge status: Home / Self Care | Payer: Commercial Managed Care - PPO | Source: Ambulatory Visit | Attending: Cardiology | Admitting: Cardiology

## 2016-09-10 ENCOUNTER — Ambulatory Visit (INDEPENDENT_AMBULATORY_CARE_PROVIDER_SITE_OTHER): Payer: Commercial Managed Care - PPO | Admitting: Cardiology

## 2016-09-10 VITALS — BP 120/84 | HR 117 | Ht 62.0 in | Wt 289.0 lb

## 2016-09-10 DIAGNOSIS — I10 Essential (primary) hypertension: Secondary | ICD-10-CM | POA: Diagnosis not present

## 2016-09-10 DIAGNOSIS — R7309 Other abnormal glucose: Secondary | ICD-10-CM | POA: Insufficient documentation

## 2016-09-10 DIAGNOSIS — R0609 Other forms of dyspnea: Secondary | ICD-10-CM

## 2016-09-10 DIAGNOSIS — R002 Palpitations: Secondary | ICD-10-CM | POA: Diagnosis not present

## 2016-09-10 LAB — COMPREHENSIVE METABOLIC PANEL
ALT: 26 U/L (ref 14–54)
ANION GAP: 11 (ref 5–15)
AST: 22 U/L (ref 15–41)
Albumin: 4 g/dL (ref 3.5–5.0)
Alkaline Phosphatase: 53 U/L (ref 38–126)
BUN: 13 mg/dL (ref 6–20)
CALCIUM: 9 mg/dL (ref 8.9–10.3)
CHLORIDE: 101 mmol/L (ref 101–111)
CO2: 27 mmol/L (ref 22–32)
Creatinine, Ser: 0.79 mg/dL (ref 0.44–1.00)
Glucose, Bld: 148 mg/dL — ABNORMAL HIGH (ref 65–99)
Potassium: 4 mmol/L (ref 3.5–5.1)
SODIUM: 139 mmol/L (ref 135–145)
Total Bilirubin: 0.9 mg/dL (ref 0.3–1.2)
Total Protein: 7.5 g/dL (ref 6.5–8.1)

## 2016-09-10 LAB — CBC WITH DIFFERENTIAL/PLATELET
Basophils Absolute: 0 10*3/uL (ref 0.0–0.1)
Basophils Relative: 1 %
EOS ABS: 0.1 10*3/uL (ref 0.0–0.7)
EOS PCT: 2 %
HCT: 43.5 % (ref 36.0–46.0)
Hemoglobin: 14.6 g/dL (ref 12.0–15.0)
LYMPHS PCT: 34 %
Lymphs Abs: 1.9 10*3/uL (ref 0.7–4.0)
MCH: 30.7 pg (ref 26.0–34.0)
MCHC: 33.6 g/dL (ref 30.0–36.0)
MCV: 91.4 fL (ref 78.0–100.0)
MONO ABS: 0.4 10*3/uL (ref 0.1–1.0)
MONOS PCT: 8 %
Neutro Abs: 3.1 10*3/uL (ref 1.7–7.7)
Neutrophils Relative %: 55 %
PLATELETS: 268 10*3/uL (ref 150–400)
RBC: 4.76 MIL/uL (ref 3.87–5.11)
RDW: 12.8 % (ref 11.5–15.5)
WBC: 5.6 10*3/uL (ref 4.0–10.5)

## 2016-09-10 LAB — LIPID PANEL
CHOLESTEROL: 213 mg/dL — AB (ref 0–200)
HDL: 51 mg/dL (ref >40)
LDL Cholesterol: 140 mg/dL — ABNORMAL HIGH (ref 0–99)
Total CHOL/HDL Ratio: 4.2 RATIO
Triglycerides: 109 mg/dL (ref <150)
VLDL: 22 mg/dL (ref 0–40)

## 2016-09-10 LAB — TSH: TSH: 0.878 u[IU]/mL (ref 0.350–4.500)

## 2016-09-10 LAB — MAGNESIUM: MAGNESIUM: 2 mg/dL (ref 1.7–2.4)

## 2016-09-10 NOTE — Patient Instructions (Signed)
Your physician recommends that you schedule a follow-up appointment in: [place] physician recommends that you continue on your current medications as directed. Please refer to the Current Medication list given to you today.  Your physician recommends that you return for lab work in: Fasting   Your physician has requested that you have an echocardiogram. Echocardiography is a painless test that uses sound waves to create images of your heart. It provides your doctor with information about the size and shape of your heart and how well your heart's chambers and valves are working. This procedure takes approximately one hour. There are no restrictions for this procedure.  If you need a refill on your cardiac medications before your next appointment, please call your pharmacy.  Thank you for choosing [facility]!

## 2016-09-10 NOTE — Progress Notes (Signed)
Clinical Summary Nicole Santos is a 46 y.o.female seen as new patient, she is referred by PA Ronnald Ramp for DOE.   1. DOE - started over 1 year ago. Variable in severity. Can occur with walking from bed to bathroom. - sedentary lifestyle.  - has had had some chest pain at times. Pressure like pain midchest, 2-3/10 in severity. Can occur at rest or with activity. +SOB. Lasts about 1 minutes. Not positional. Often occurs after eating.  - can have some LE edema. Uses 2 pillows due to reflux  - has not been seeing a pcp regularly.   CAD risk factors: HTN. Maternal uncle CABG in his 30s, aunt CABG late 60s.    2. Palpitations - occurs sporadically. About 4 episodes over the last year - feeling of heart pounding, 1-2 beats at a time.     3. HTN - started lisionpril a few months ago - compliant with meds  4. Hx of DVT - diagnosed in 2001 - was in [facility] due to being bed ridden. History of cervical cancer surgery at the time.   5. GERD - takes zantac regularly  Past Medical History:  Diagnosis Date  . Cancer (HCC)      Allergies  Allergen Reactions  . Doxycycline     Abdominal cramps.     Current Outpatient Prescriptions  Medication Sig Dispense Refill  . aspirin EC 81 MG tablet Take 81 mg by mouth daily.    Marland Kitchen lisinopril (PRINIVIL,ZESTRIL) 10 MG tablet Take 1 tablet (10 mg total) by mouth daily. (Patient not taking: Reported on 08/01/2016) 90 tablet 3  . ranitidine (ZANTAC) 150 MG tablet Take 150 mg by mouth 2 (two) times daily. Takes PRN,  OTC     No current facility-administered medications for this visit.      Past Surgical History:  Procedure Laterality Date  . KNEE SURGERY Right    Arthroscopic     Allergies  Allergen Reactions  . Doxycycline     Abdominal cramps.      Family History  Problem Relation Age of Onset  . Hypertension Mother      Social History Nicole Santos reports that she has never smoked. She has never used smokeless  tobacco. Nicole Santos reports that she does not drink alcohol.   Review of Systems CONSTITUTIONAL: No weight loss, fever, chills, weakness or fatigue.  HEENT: Eyes: No visual loss, blurred vision, double vision or yellow sclerae.No hearing loss, sneezing, congestion, runny nose or sore throat.  SKIN: No rash or itching.  CARDIOVASCULAR: per HPI RESPIRATORY: per HPI GASTROINTESTINAL: No anorexia, nausea, vomiting or diarrhea. No abdominal pain or blood.  GENITOURINARY: No burning on urination, no polyuria NEUROLOGICAL: No headache, dizziness, syncope, paralysis, ataxia, numbness or tingling in the extremities. No change in bowel or bladder control.  MUSCULOSKELETAL: No muscle, back pain, joint pain or stiffness.  LYMPHATICS: No enlarged nodes. No history of splenectomy.  PSYCHIATRIC: No history of depression or anxiety.  ENDOCRINOLOGIC: No reports of sweating, cold or heat intolerance. No polyuria or polydipsia.  Marland Kitchen   Physical Examination Vitals:   09/10/16 0834 09/10/16 0840  BP: (!) 122/92 120/84  Pulse: (!) 117    Vitals:   09/10/16 0834  Weight: 289 lb (131.1 kg)  Height: 5\' 2"  (1.575 m)    Gen: resting comfortably, no acute distress HEENT: no scleral icterus, pupils equal round and reactive, no palptable cervical adenopathy,  CV: RRR, no m/r/g, no jvd Resp: Clear to auscultation  bilaterally GI: abdomen is soft, non-tender, non-distended, normal bowel sounds, no hepatosplenomegaly MSK: extremities are warm, no edema.  Skin: warm, no rash Neuro:  no focal deficits Psych: appropriate affect     Assessment and Plan  1. DOE - we will obtain echocardiogram to evaluate for possible cardiac dysfunction as the etiology  2. Palpitations - fairly mild infrequent symptoms. We will check BMET/Mg/TSH. Limit caffeine intake. Continue to monitor at this time. If progresses consider heart monitor  3. HTN - at goal, contiue current meds/   F/u 4 weeks with PA. Check annual labs.        Arnoldo Lenis, M.D.

## 2016-09-11 LAB — HEMOGLOBIN A1C
Hgb A1c MFr Bld: 7.3 % — ABNORMAL HIGH (ref 4.8–5.6)
Mean Plasma Glucose: 163 mg/dL

## 2016-09-12 ENCOUNTER — Telehealth: Payer: Self-pay

## 2016-09-12 MED ORDER — ATORVASTATIN CALCIUM 40 MG PO TABS: 40.0000 mg | ORAL_TABLET | Freq: Every day | ORAL | 3 refills | Status: DC

## 2016-09-12 NOTE — Telephone Encounter (Signed)
Called pt. No answer, left message for pt to return call. Sent  A copy of test to PCP. Sent prescription to [facility].

## 2016-09-12 NOTE — Telephone Encounter (Signed)
-----   Message from [place] sent at 09/12/2016  2:29 PM EDT -----   ----- Message ----- From: Arnoldo Lenis, MD Sent: 09/12/2016   2:25 PM To: Staci T Ashworth, CMA  Labs show she is diabetic, from what I can tell this looks to be a new diagnosis for her. She needs to f/u with pcp to discuss further and discuss treatment, Her cholesterol is too high, particularly now that she classifies as being diabets. She needs to start atrovastatin 40mg  daily as well  Zandra Abts MD

## 2016-09-17 ENCOUNTER — Ambulatory Visit (HOSPITAL_COMMUNITY)
Admission: RE | Admit: 2016-09-17 | Discharge: 2016-09-17 | Disposition: A | Discharge status: Home / Self Care | Payer: Commercial Managed Care - PPO | Source: Ambulatory Visit | Attending: Cardiology | Admitting: Cardiology

## 2016-09-17 DIAGNOSIS — R0609 Other forms of dyspnea: Secondary | ICD-10-CM | POA: Diagnosis not present

## 2016-09-17 NOTE — Progress Notes (Signed)
*  PRELIMINARY RESULTS* Echocardiogram 2D Echocardiogram has been performed.  Leavy Cella 09/17/2016, 1:10 PM

## 2016-10-02 ENCOUNTER — Ambulatory Visit (INDEPENDENT_AMBULATORY_CARE_PROVIDER_SITE_OTHER): Payer: Commercial Managed Care - PPO | Admitting: Physician Assistant

## 2016-10-02 ENCOUNTER — Encounter: Payer: Self-pay | Admitting: Physician Assistant

## 2016-10-02 VITALS — BP 128/82 | HR 111 | Temp 98.2°F | Ht 62.0 in | Wt 290.2 lb

## 2016-10-02 DIAGNOSIS — E785 Hyperlipidemia, unspecified: Secondary | ICD-10-CM | POA: Diagnosis not present

## 2016-10-02 DIAGNOSIS — E119 Type 2 diabetes mellitus without complications: Secondary | ICD-10-CM | POA: Diagnosis not present

## 2016-10-02 DIAGNOSIS — E1169 Type 2 diabetes mellitus with other specified complication: Secondary | ICD-10-CM

## 2016-10-02 DIAGNOSIS — I10 Essential (primary) hypertension: Secondary | ICD-10-CM | POA: Diagnosis not present

## 2016-10-02 NOTE — Patient Instructions (Signed)
Carbohydrate Counting for Diabetes Mellitus, Adult Carbohydrate counting is a method for keeping track of how many carbohydrates you eat. Eating carbohydrates naturally increases the amount of sugar (glucose) in the blood. Counting how many carbohydrates you eat helps keep your blood glucose within normal limits, which helps you manage your diabetes (diabetes mellitus). It is important to know how many carbohydrates you can safely have in each meal. This is different for every person. A diet and nutrition specialist (registered dietitian) can help you make a meal plan and calculate how many carbohydrates you should have at each meal and snack. Carbohydrates are found in the following foods:  Grains, such as breads and cereals.  Dried beans and soy products.  Starchy vegetables, such as potatoes, peas, and corn.  Fruit and fruit juices.  Milk and yogurt.  Sweets and snack foods, such as cake, cookies, candy, chips, and soft drinks. How do I count carbohydrates? There are two ways to count carbohydrates in food. You can use either of the methods or a combination of both. Reading "Nutrition Facts" on packaged food  The "Nutrition Facts" list is included on the labels of almost all packaged foods and beverages in the [place] It includes:  The serving size.  Information about nutrients in each serving, including the grams (g) of carbohydrate per serving. To use the "Nutrition Facts":  Decide how many servings you will have.  Multiply the number of servings by the number of carbohydrates per serving.  The resulting number is the total amount of carbohydrates that you will be having. Learning standard serving sizes of other foods  When you eat foods containing carbohydrates that are not packaged or do not include "Nutrition Facts" on the label, you need to measure the servings in order to count the amount of carbohydrates:  Measure the foods that you will eat with a food scale or measuring  cup, if needed.  Decide how many standard-size servings you will eat.  Multiply the number of servings by 15. Most carbohydrate-rich foods have about 15 g of carbohydrates per serving.  For example, if you eat 8 oz (170 g) of strawberries, you will have eaten 2 servings and 30 g of carbohydrates (2 servings x 15 g = 30 g).  For foods that have more than one food mixed, such as soups and casseroles, you must count the carbohydrates in each food that is included. The following list contains standard serving sizes of common carbohydrate-rich foods. Each of these servings has about 15 g of carbohydrates:   hamburger bun or  English muffin.   oz (15 mL) syrup.   oz (14 g) jelly.  1 slice of bread.  1 six-inch tortilla.  3 oz (85 g) cooked rice or pasta.  4 oz (113 g) cooked dried beans.  4 oz (113 g) starchy vegetable, such as peas, corn, or potatoes.  4 oz (113 g) hot cereal.  4 oz (113 g) mashed potatoes or  of a large baked potato.  4 oz (113 g) canned or frozen fruit.  4 oz (120 mL) fruit juice.  4-6 crackers.  6 chicken nuggets.  6 oz (170 g) unsweetened dry cereal.  6 oz (170 g) plain fat-free yogurt or yogurt sweetened with artificial sweeteners.  8 oz (240 mL) milk.  8 oz (170 g) fresh fruit or one small piece of fruit.  24 oz (680 g) popped popcorn. Example of carbohydrate counting Sample meal  3 oz (85 g) chicken breast.  6 oz (  170 g) brown rice.  4 oz (113 g) corn.  8 oz (240 mL) milk.  8 oz (170 g) strawberries with sugar-free whipped topping. Carbohydrate calculation 1. Identify the foods that contain carbohydrates:  Rice.  Corn.  Milk.  Strawberries. 2. Calculate how many servings you have of each food:  2 servings rice.  1 serving corn.  1 serving milk.  1 serving strawberries. 3. Multiply each number of servings by 15 g:  2 servings rice x 15 g = 30 g.  1 serving corn x 15 g = 15 g.  1 serving milk x 15 g = 15  g.  1 serving strawberries x 15 g = 15 g. 4. Add together all of the amounts to find the total grams of carbohydrates eaten:  30 g + 15 g + 15 g + 15 g = 75 g of carbohydrates total. This information is not intended to replace advice given to you by your health care provider. Make sure you discuss any questions you have with your health care provider. Document Released: 05/21/2005 Document Revised: 12/09/2015 Document Reviewed: 11/02/2015 Elsevier Interactive Patient Education  2017 Elsevier Inc.  

## 2016-10-03 DIAGNOSIS — E119 Type 2 diabetes mellitus without complications: Secondary | ICD-10-CM | POA: Insufficient documentation

## 2016-10-03 DIAGNOSIS — E785 Hyperlipidemia, unspecified: Secondary | ICD-10-CM | POA: Insufficient documentation

## 2016-10-03 DIAGNOSIS — E782 Mixed hyperlipidemia: Secondary | ICD-10-CM | POA: Insufficient documentation

## 2016-10-03 DIAGNOSIS — E1169 Type 2 diabetes mellitus with other specified complication: Secondary | ICD-10-CM | POA: Insufficient documentation

## 2016-10-03 NOTE — Progress Notes (Signed)
BP 128/82   Pulse (!) 111   Temp 98.2 F (36.8 C) (Oral)   Ht 5\' 2"  (1.575 m)   Wt 290 lb 3.2 oz (131.6 kg)   BMI 53.08 kg/m    Subjective:    Patient ID: Nicole Santos, female    DOB: [birth year], 46 y.o.   MRN: [id number]  HPI: Nicole Santos is a 46 y.o. female presenting on 10/02/2016 for Follow-up (2 month rck BP)  This patient comes in for periodic recheck on medications and conditions including hypertension, high glucose and A1c.  During a hospital visit her labs were elevated and A1c was up. She is ready to start diet and exercise.  Information will be given for carbs and cholesterol.  All medications are reviewed today. There are no reports of any problems with the medications. All of the medical conditions are reviewed and updated.  Lab work is reviewed and will be ordered as medically necessary. There are no new problems reported with today's visit.  Relevant past medical, surgical, family and social history reviewed and updated as indicated. Allergies and medications reviewed and updated.  Past Medical History:  Diagnosis Date  . Cancer ([place])   . Hypertension     Past Surgical History:  Procedure Laterality Date  . KNEE SURGERY Right    Arthroscopic  . TONSILLECTOMY AND ADENOIDECTOMY      Review of Systems  Constitutional: Negative.  Negative for activity change, fatigue and fever.  HENT: Negative.   Eyes: Negative.   Respiratory: Negative.  Negative for cough.   Cardiovascular: Negative.  Negative for chest pain.  Gastrointestinal: Negative.  Negative for abdominal pain.  Endocrine: Negative.   Genitourinary: Negative.  Negative for dysuria.  Musculoskeletal: Negative.   Skin: Negative.   Neurological: Negative.     Allergies as of 10/02/2016      Reactions   Doxycycline    Abdominal cramps.      Medication List       Accurate as of 10/02/16 11:59 PM. Always use your most recent med list.          aspirin EC 81 MG tablet Take 81 mg by mouth daily.     atorvastatin 40 MG tablet Commonly known as:  LIPITOR Take 1 tablet (40 mg total) by mouth daily.   lisinopril 10 MG tablet Commonly known as:  PRINIVIL,ZESTRIL Take 1 tablet (10 mg total) by mouth daily.   ranitidine 150 MG tablet Commonly known as:  ZANTAC Take 150 mg by mouth 2 (two) times daily. Takes PRN,  OTC          Objective:    BP 128/82   Pulse (!) 111   Temp 98.2 F (36.8 C) (Oral)   Ht 5\' 2"  (1.575 m)   Wt 290 lb 3.2 oz (131.6 kg)   BMI 53.08 kg/m   Allergies  Allergen Reactions  . Doxycycline     Abdominal cramps.    Physical Exam  Constitutional: She is oriented to person, place, and time. She appears well-developed and well-nourished.  HENT:  Head: Normocephalic and atraumatic.  Eyes: Conjunctivae and EOM are normal. Pupils are equal, round, and reactive to light.  Cardiovascular: Normal rate, regular rhythm, normal heart sounds and intact distal pulses.   Pulmonary/Chest: Effort normal and breath sounds normal.  Abdominal: Soft. Bowel sounds are normal.  Neurological: She is alert and oriented to person, place, and time. She has normal reflexes.  Skin: Skin is warm and dry. No  rash noted.  Psychiatric: She has a normal mood and affect. Her behavior is normal. Judgment and thought content normal.    Results for orders placed or performed during the hospital encounter of 09/10/16  CBC with Differential/Platelet  Result Value Ref Range   WBC 5.6 4.0 - 10.5 K/uL   RBC 4.76 3.87 - 5.11 MIL/uL   Hemoglobin 14.6 12.0 - 15.0 g/dL   HCT 43.5 36.0 - 46.0 %   MCV 91.4 78.0 - 100.0 fL   MCH 30.7 26.0 - 34.0 pg   MCHC 33.6 30.0 - 36.0 g/dL   RDW 12.8 11.5 - 15.5 %   Platelets 268 150 - 400 K/uL   Neutrophils Relative % 55 %   Neutro Abs 3.1 1.7 - 7.7 K/uL   Lymphocytes Relative 34 %   Lymphs Abs 1.9 0.7 - 4.0 K/uL   Monocytes Relative 8 %   Monocytes Absolute 0.4 0.1 - 1.0 K/uL   Eosinophils Relative 2 %   Eosinophils Absolute 0.1 0.0 - 0.7 K/uL    Basophils Relative 1 %   Basophils Absolute 0.0 0.0 - 0.1 K/uL  Comprehensive metabolic panel  Result Value Ref Range   Sodium 139 135 - 145 mmol/L   Potassium 4.0 3.5 - 5.1 mmol/L   [place] 101 101 - 111 mmol/L   CO2 27 22 - 32 mmol/L   Glucose, Bld 148 (H) 65 - 99 mg/dL   BUN 13 6 - 20 mg/dL   Creatinine, Ser 0.79 0.44 - 1.00 mg/dL   Calcium 9.0 8.9 - 10.3 mg/dL   Total Protein 7.5 6.5 - 8.1 g/dL   Albumin 4.0 3.5 - 5.0 g/dL   AST 22 15 - 41 U/L   ALT 26 14 - 54 U/L   Alkaline Phosphatase 53 38 - 126 U/L   Total Bilirubin 0.9 0.3 - 1.2 mg/dL   GFR calc non Af Amer >60 >60 mL/min   GFR calc Af Amer >60 >60 mL/min   Anion gap 11 5 - 15  TSH  Result Value Ref Range   TSH 0.878 0.350 - 4.500 uIU/mL  Hemoglobin A1c  Result Value Ref Range   Hgb A1c MFr Bld 7.3 (H) 4.8 - 5.6 %   Mean Plasma Glucose 163 mg/dL  Magnesium  Result Value Ref Range   Magnesium 2.0 1.7 - 2.4 mg/dL  Lipid panel  Result Value Ref Range   Cholesterol 213 (H) 0 - 200 mg/dL   Triglycerides 109 <150 mg/dL   HDL 51 >40 mg/dL   Total CHOL/HDL Ratio 4.2 RATIO   VLDL 22 0 - 40 mg/dL   LDL Cholesterol 140 (H) 0 - 99 mg/dL      Assessment & Plan:   1. Diabetes mellitus without complication ([place])  2. Hyperlipidemia associated with type 2 diabetes mellitus ([place])  3. Essential hypertension   Current Outpatient Prescriptions:  .  aspirin EC 81 MG tablet, Take 81 mg by mouth daily., Disp: , Rfl:  .  lisinopril (PRINIVIL,ZESTRIL) 10 MG tablet, Take 1 tablet (10 mg total) by mouth daily., Disp: 90 tablet, Rfl: 3 .  ranitidine (ZANTAC) 150 MG tablet, Take 150 mg by mouth 2 (two) times daily. Takes PRN,  OTC, Disp: , Rfl:  .  atorvastatin (LIPITOR) 40 MG tablet, Take 1 tablet (40 mg total) by mouth daily. (Patient not taking: Reported on 10/02/2016), Disp: 90 tablet, Rfl: 3  Continue all other maintenance medications as listed above.  Follow up plan: Return in about 3  months (around 01/02/2017) for  recheck, A1c and lipid.  Educational handout given for carb counting  Terald Sleeper PA-C [facility] [address]  [place], [place] [address] [phone]   10/03/2016, 10:41 AM

## 2016-10-07 NOTE — Progress Notes (Signed)
Cardiology Office Note   Date:  10/08/2016   ID:  Nicole Santos, DOB [birth year], MRN [id number]  PCP:  Terald Sleeper PA-C  Cardiologist:  [facility]   Chief Complaint  Patient presents with  . Hypertension  . Shortness of Breath      History of Present Illness: Nicole Santos is a 46 y.o. female who presents for ongoing assessment and management of DOE, chronic LEE, palpitations, hypertension, and hx of DVT. Was last seen in the office by Dr. Harl Bowie on 09/10/2016. She was scheduled for echocardiogram, and had annual labs completed.   Echocardiogram 09/17/2016 Left ventricle: The cavity size was normal. Wall thickness was   normal. Systolic function was normal. The estimated ejection   fraction was in the range of 55% to 60%. Left ventricular   diastolic function parameters were normal. - Aortic valve: Valve area (VTI): 1.81 cm^2. Valve area (Vmax):   1.81 cm^2. - Technically difficult study.  Labs: Na 139, K+ 4.0, Cr 0.79,  TC: 213, HDL 51, TG 109, LDL 140.  Hgb 14.6 Hct 43.5, PLTs 268.   She comes today with continued complaints of dyspnea. No chest pain. She has not had PFT's in the past. She did have a sleep study in 2012 which did show moderate OSA with CPAP recommended. This was not followed.   Past Medical History:  Diagnosis Date  . Cancer ([place])   . Hypertension     Past Surgical History:  Procedure Laterality Date  . KNEE SURGERY Right    Arthroscopic  . TONSILLECTOMY AND ADENOIDECTOMY       Current Outpatient Prescriptions  Medication Sig Dispense Refill  . aspirin EC 81 MG tablet Take 81 mg by mouth daily.    Marland Kitchen atorvastatin (LIPITOR) 20 MG tablet Take 20 mg by mouth daily.    Marland Kitchen lisinopril (PRINIVIL,ZESTRIL) 10 MG tablet Take 1 tablet (10 mg total) by mouth daily. 90 tablet 3  . ranitidine (ZANTAC) 150 MG tablet Take 150 mg by mouth 2 (two) times daily. Takes PRN,  OTC     No current facility-administered medications for this visit.     Allergies:   Doxycycline     Social History:  The patient  reports that she has never smoked. She has never used smokeless tobacco. She reports that she does not drink alcohol or use drugs.   Family History:  The patient's family history includes Hypertension in her mother.    ROS: All other systems are reviewed and negative. Unless otherwise mentioned in H&P    PHYSICAL EXAM: VS:  BP 140/78   Pulse 98   Ht 5\' 2"  (1.575 m)   Wt 288 lb (130.6 kg)   SpO2 94%   BMI 52.68 kg/m  , BMI Body mass index is 52.68 kg/m. GEN: Well nourished, well developed, in no acute distress Obese.  HEENT: normal  Neck: no JVD, carotid bruits, or masses Cardiac: RRR; no murmurs, rubs, or gallops,no edema  Respiratory:  clear to auscultation bilaterally, normal work of breathing GI: soft, nontender, nondistended, + BS MS: no deformity or atrophy  Skin: warm and dry, no rash Neuro:  Strength and sensation are intact Psych: euthymic mood, full affect   Recent Labs: 09/10/2016: ALT 26; BUN 13; Creatinine, Ser 0.79; Hemoglobin 14.6; Magnesium 2.0; Platelets 268; Potassium 4.0; Sodium 139; TSH 0.878    Lipid Panel    Component Value Date/Time   CHOL 213 (H) 09/10/2016 0926   TRIG 109 09/10/2016 0926  HDL 51 09/10/2016 0926   CHOLHDL 4.2 09/10/2016 0926   VLDL 22 09/10/2016 0926   LDLCALC 140 (H) 09/10/2016 0926      Wt Readings from Last 3 Encounters:  10/08/16 288 lb (130.6 kg)  10/02/16 290 lb 3.2 oz (131.6 kg)  09/10/16 289 lb (131.1 kg)      Other studies Reviewed: Sleep Study 10/05/2010 IMPRESSION:  Moderate obstructive sleep apnea syndrome, worse during REM  sleep.   RECOMMENDATIONS:  Formal titration study to assess optimal CPAP  pressure.  ASSESSMENT AND PLAN:  1. Dyspnea: Echocardiogram is reassuring. This has been explained to her. No plans for ischemic testing at this time, unless symptoms worsen. Recommend PFT's be ordered by PCP.   2. OSA: Diagnosed in 2012. She has not been placed on CPAP as  directed. I have given her a copy of the study report to take to PCP. They may need to have CPAP placed, vs repeating the study.   3 Hypercholesterolemia: She is on statin therapy. Followed by PCP, she will have follow up labs completed in July.    Current medicines are reviewed at length with the patient today.    Labs/ tests ordered today include:  No orders of the defined types were placed in this encounter.    Disposition:   FU with PRN  Signed, Jory Sims, NP  10/08/2016 1:58 PM    [facility] [address], [place], [place] [address] Phone: [phone]; Fax: [phone]

## 2016-10-08 ENCOUNTER — Encounter: Payer: Self-pay | Admitting: Adult Health

## 2016-10-08 ENCOUNTER — Ambulatory Visit (INDEPENDENT_AMBULATORY_CARE_PROVIDER_SITE_OTHER): Payer: Commercial Managed Care - PPO | Admitting: Adult Health

## 2016-10-08 VITALS — BP 140/78 | HR 98 | Ht 62.0 in | Wt 288.0 lb

## 2016-10-08 DIAGNOSIS — I10 Essential (primary) hypertension: Secondary | ICD-10-CM

## 2016-10-08 DIAGNOSIS — R0609 Other forms of dyspnea: Secondary | ICD-10-CM | POA: Diagnosis not present

## 2016-10-08 DIAGNOSIS — E7800 Pure hypercholesterolemia, unspecified: Secondary | ICD-10-CM

## 2016-10-08 DIAGNOSIS — G4733 Obstructive sleep apnea (adult) (pediatric): Secondary | ICD-10-CM

## 2016-10-08 NOTE — Patient Instructions (Signed)
Your physician recommends that you schedule a follow-up appointment in: as needed   Thank you for choosing [facility] !         

## 2016-11-28 ENCOUNTER — Encounter: Payer: Commercial Managed Care - PPO | Admitting: *Deleted

## 2016-11-28 DIAGNOSIS — Z1231 Encounter for screening mammogram for malignant neoplasm of breast: Secondary | ICD-10-CM | POA: Diagnosis not present

## 2017-01-07 ENCOUNTER — Ambulatory Visit (INDEPENDENT_AMBULATORY_CARE_PROVIDER_SITE_OTHER): Payer: Commercial Managed Care - PPO | Admitting: Physician Assistant

## 2017-01-07 ENCOUNTER — Encounter: Payer: Self-pay | Admitting: Physician Assistant

## 2017-01-07 VITALS — BP 119/77 | HR 102 | Temp 97.4°F | Ht 62.0 in | Wt 282.0 lb

## 2017-01-07 DIAGNOSIS — E1169 Type 2 diabetes mellitus with other specified complication: Secondary | ICD-10-CM

## 2017-01-07 DIAGNOSIS — E785 Hyperlipidemia, unspecified: Secondary | ICD-10-CM

## 2017-01-07 DIAGNOSIS — I10 Essential (primary) hypertension: Secondary | ICD-10-CM

## 2017-01-07 DIAGNOSIS — E119 Type 2 diabetes mellitus without complications: Secondary | ICD-10-CM | POA: Diagnosis not present

## 2017-01-07 LAB — BAYER DCA HB A1C WAIVED: HB A1C: 6.8 % (ref <7.0)

## 2017-01-07 NOTE — Progress Notes (Signed)
BP 119/77   Pulse (!) 102   Temp (!) 97.4 F (36.3 C) (Oral)   Ht _0  (1.575 m)   Wt 282 lb (127.9 kg)   BMI 51.58 kg/m    Subjective:    Patient ID: Nicole Santos, female    DOB: 1970-08-13, 46 y.o.   MRN: [id number]  HPI: Nicole Santos is a 46 y.o. female presenting on 01/07/2017 for Follow-up (3 month); Diabetes; Hyperlipidemia; and Hypertension  This patient comes in for periodic recheck on medications and conditions including diabetes, essential hypertension, hyperlipidemia. She reports that she has not been checking her sugars. She does not have a glucometer. We were working on diet and exercise over the past 3 months. We will check an A1c today. In addition she is taking Lipitor 20 mg. At this time she is using a half tablet of her 40 mg. If the lipid panel is good we will plan to change it to a 20 mg tablets of that she can take the tablet easily. We've reviewed all of her cardiology notes today..   All medications are reviewed today. There are no reports of any problems with the medications. All of the medical conditions are reviewed and updated.  Lab work is reviewed and will be ordered as medically necessary. There are no new problems reported with today's visit.    Relevant past medical, surgical, family and social history reviewed and updated as indicated. Allergies and medications reviewed and updated.  Past Medical History:  Diagnosis Date  . Cancer ([place])   . Hypertension     Past Surgical History:  Procedure Laterality Date  . KNEE SURGERY Right    Arthroscopic  . TONSILLECTOMY AND ADENOIDECTOMY      Review of Systems  Constitutional: Negative.  Negative for activity change, fatigue and fever.  HENT: Negative.   Eyes: Negative.   Respiratory: Negative.  Negative for cough.   Cardiovascular: Negative.  Negative for chest pain.  Gastrointestinal: Negative.  Negative for abdominal pain.  Endocrine: Negative.   Genitourinary: Negative.  Negative for dysuria.    Musculoskeletal: Negative.   Skin: Negative.   Neurological: Negative.     Allergies as of 01/07/2017      Reactions   Doxycycline    Abdominal cramps.      Medication List       Accurate as of 01/07/17 12:45 PM. Always use your most recent med list.          aspirin EC 81 MG tablet Take 81 mg by mouth daily.   atorvastatin 40 MG tablet Commonly known as:  LIPITOR Take 20 mg by mouth daily.   lisinopril 10 MG tablet Commonly known as:  PRINIVIL,ZESTRIL Take 1 tablet (10 mg total) by mouth daily.   ranitidine 150 MG tablet Commonly known as:  ZANTAC Take 150 mg by mouth 2 (two) times daily. Takes PRN,  OTC          Objective:    BP 119/77   Pulse (!) 102   Temp (!) 97.4 F (36.3 C) (Oral)   Ht _1  (1.575 m)   Wt 282 lb (127.9 kg)   BMI 51.58 kg/m   Allergies  Allergen Reactions  . Doxycycline     Abdominal cramps.    Physical Exam  Constitutional: She is oriented to person, place, and time. She appears well-developed and well-nourished.  HENT:  Head: Normocephalic and atraumatic.  Right Ear: Tympanic membrane, external ear and ear canal normal.  Left Ear: Tympanic membrane, external ear and ear canal normal.  Nose: Nose normal. No rhinorrhea.  Mouth/Throat: Oropharynx is clear and moist and mucous membranes are normal. No oropharyngeal exudate or posterior oropharyngeal erythema.  Eyes: Pupils are equal, round, and reactive to light. Conjunctivae and EOM are normal.  Neck: Normal range of motion. Neck supple.  Cardiovascular: Normal rate, regular rhythm, normal heart sounds and intact distal pulses.   Pulmonary/Chest: Effort normal and breath sounds normal.  Abdominal: Soft. Bowel sounds are normal.  Neurological: She is alert and oriented to person, place, and time. She has normal reflexes.  Skin: Skin is warm and dry. No rash noted.  Psychiatric: She has a normal mood and affect. Her behavior is normal. Judgment and thought content normal.     Results for orders placed or performed in visit on 01/07/17  Bayer DCA Hb A1c Waived  Result Value Ref Range   Bayer DCA Hb A1c Waived 6.8 <7.0 %      Assessment & Plan:   1. Diabetes mellitus without complication ([place]) - MSX11+BZMC - Lipid panel - Bayer DCA Hb A1c Waived  2. Essential hypertension - CMP14+EGFR  3. Hyperlipidemia associated with type 2 diabetes mellitus ([facility]) - atorvastatin (LIPITOR) 40 MG tablet; Take 20 mg by mouth daily. ; Refill: 3 If lipid panel is good, new prescription for Lipitor 20 mg 1 daily will be sent. If not adequately reduced will have patient increase to 40 mg. - CMP14+EGFR - Lipid panel   Current Outpatient Prescriptions:  .  aspirin EC 81 MG tablet, Take 81 mg by mouth daily., Disp: , Rfl:  .  atorvastatin (LIPITOR) 40 MG tablet, Take 20 mg by mouth daily. , Disp: , Rfl: 3 .  lisinopril (PRINIVIL,ZESTRIL) 10 MG tablet, Take 1 tablet (10 mg total) by mouth daily., Disp: 90 tablet, Rfl: 3 .  ranitidine (ZANTAC) 150 MG tablet, Take 150 mg by mouth 2 (two) times daily. Takes PRN,  OTC, Disp: , Rfl:   Continue all other maintenance medications as listed above.  Follow up plan: Return in about 6 months (around 07/10/2017).  Educational handout given for [place] PA-C [facility] [address]  [place], [place] [address] [phone]   01/07/2017, 12:45 PM

## 2017-01-07 NOTE — Patient Instructions (Signed)
In a few days you may receive a survey in the mail or online from [employer] regarding your visit with us today. Please take a moment to fill this out. Your feedback is very important to our whole office. It can help us better understand your needs as well as improve your experience and satisfaction. Thank you for taking your time to complete it. We care about you.  Melora Menon, PA-C  

## 2017-01-08 ENCOUNTER — Other Ambulatory Visit: Payer: Self-pay | Admitting: Physician Assistant

## 2017-01-08 DIAGNOSIS — E1169 Type 2 diabetes mellitus with other specified complication: Secondary | ICD-10-CM

## 2017-01-08 DIAGNOSIS — E785 Hyperlipidemia, unspecified: Principal | ICD-10-CM

## 2017-01-08 LAB — CMP14+EGFR
A/G RATIO: 1.7 (ref 1.2–2.2)
ALK PHOS: 64 IU/L (ref 39–117)
ALT: 29 IU/L (ref 0–32)
AST: 19 IU/L (ref 0–40)
Albumin: 4 g/dL (ref 3.5–5.5)
BILIRUBIN TOTAL: 0.9 mg/dL (ref 0.0–1.2)
BUN / CREAT RATIO: 13 (ref 9–23)
BUN: 11 mg/dL (ref 6–24)
CALCIUM: 9.4 mg/dL (ref 8.7–10.2)
CHLORIDE: 103 mmol/L (ref 96–106)
CO2: 23 mmol/L (ref 20–29)
Creatinine, Ser: 0.83 mg/dL (ref 0.57–1.00)
GFR calc non Af Amer: 84 mL/min/{1.73_m2} (ref >59)
GFR, EST AFRICAN AMERICAN: 96 mL/min/{1.73_m2} (ref >59)
GLOBULIN, TOTAL: 2.4 g/dL (ref 1.5–4.5)
Glucose: 149 mg/dL — ABNORMAL HIGH (ref 65–99)
Potassium: 5.3 mmol/L — ABNORMAL HIGH (ref 3.5–5.2)
SODIUM: 142 mmol/L (ref 134–144)
TOTAL PROTEIN: 6.4 g/dL (ref 6.0–8.5)

## 2017-01-08 LAB — LIPID PANEL
CHOLESTEROL TOTAL: 137 mg/dL (ref 100–199)
Chol/HDL Ratio: 2.7 ratio (ref 0.0–4.4)
HDL: 50 mg/dL (ref >39)
LDL CALC: 71 mg/dL (ref 0–99)
Triglycerides: 81 mg/dL (ref 0–149)
VLDL Cholesterol Cal: 16 mg/dL (ref 5–40)

## 2017-01-08 MED ORDER — ATORVASTATIN CALCIUM 20 MG PO TABS: 20.0000 mg | ORAL_TABLET | Freq: Every day | ORAL | 3 refills | Status: DC

## 2017-01-08 MED ORDER — METFORMIN HCL 500 MG PO TABS: 500.0000 mg | ORAL_TABLET | Freq: Every day | ORAL | 1 refills | Status: DC

## 2017-01-08 NOTE — Addendum Note (Signed)
Addended by: Thana Ates on: 01/08/2017 09:57 AM   Modules accepted: Orders

## 2017-04-04 ENCOUNTER — Encounter: Payer: Self-pay | Admitting: Family Medicine

## 2017-04-04 ENCOUNTER — Ambulatory Visit (INDEPENDENT_AMBULATORY_CARE_PROVIDER_SITE_OTHER): Payer: Commercial Managed Care - PPO | Admitting: Family Medicine

## 2017-04-04 VITALS — BP 143/96 | HR 87 | Temp 97.4°F | Ht 62.0 in | Wt 277.8 lb

## 2017-04-04 DIAGNOSIS — J181 Lobar pneumonia, unspecified organism: Secondary | ICD-10-CM | POA: Diagnosis not present

## 2017-04-04 DIAGNOSIS — R6883 Chills (without fever): Secondary | ICD-10-CM | POA: Diagnosis not present

## 2017-04-04 DIAGNOSIS — J189 Pneumonia, unspecified organism: Secondary | ICD-10-CM

## 2017-04-04 LAB — VERITOR FLU A/B WAIVED
INFLUENZA A: NEGATIVE
INFLUENZA B: NEGATIVE

## 2017-04-04 MED ORDER — HYDROCODONE-HOMATROPINE 5-1.5 MG/5ML PO SYRP: 5.0000 mL | ORAL_SOLUTION | Freq: Four times a day (QID) | ORAL | 0 refills | Status: DC | PRN

## 2017-04-04 MED ORDER — LEVOFLOXACIN 750 MG PO TABS: 750.0000 mg | ORAL_TABLET | Freq: Every day | ORAL | 0 refills | Status: DC

## 2017-04-04 NOTE — Progress Notes (Signed)
   HPI  Patient presents today here with acute illness.  Patient explains she has had cough, wheezing, and off-and-on fever for about 6 days.  Her last measured fever was 102 degrees on Wednesday morning, about 36 hours ago. She is also having chills and headaches.  She feels the worse when the fever is on.  She is tolerating food and fluids like usual.  She does have shortness of breath however it is not severe  PMH: Smoking status noted ROS: Per HPI  Objective: BP (!) 143/96   Pulse 87   Temp (!) 97.4 F (36.3 C) (Oral)   Ht 5\' 2"  (1.575 m)   Wt 277 lb 12.8 oz (126 kg)   SpO2 96%   BMI 50.81 kg/m  Gen: NAD, alert, cooperative with exam HEENT: NCAT, oropharynx moist and clear, nares clear, TMs normal bilaterally CV: RRR, good S1/S2, no murmur Resp: Expiratory rhonchi in the left lower base, added sounds with coarse breath sounds in the right base as well Ext: No edema, warm Neuro: Alert and oriented, No gross deficits  Assessment and plan:  # CAP Clinical Dx with fever, course of illness and added sounds in L base Levaquin Hycodan cough syrup for night time cough.  RTC with any concerns    Orders Placed This Encounter  Procedures  . Veritor Flu A/B Waived    Order Specific Question:   Source    Answer:   nasal    Meds ordered this encounter  Medications  . levofloxacin (LEVAQUIN) 750 MG tablet    Sig: Take 1 tablet (750 mg total) by mouth daily.    Dispense:  7 tablet    Refill:  0  . HYDROcodone-homatropine (HYCODAN) 5-1.5 MG/5ML syrup    Sig: Take 5 mLs by mouth every 6 (six) hours as needed for cough.    Dispense:  120 mL    Refill:  0    Laroy Apple, MD [facility] 04/04/2017, 3:51 PM

## 2017-04-04 NOTE — Patient Instructions (Signed)
Great to meet you!   Community-Acquired Pneumonia, Adult Pneumonia is an infection of the lungs. There are different types of pneumonia. One type can develop while a person is in a hospital. A different type, called community-acquired pneumonia, develops in people who are not, or have not recently been, in the hospital or other health care facility. What are the causes? Pneumonia may be caused by bacteria, viruses, or funguses. Community-acquired pneumonia is often caused by Streptococcus pneumonia bacteria. These bacteria are often passed from one person to another by breathing in droplets from the cough or sneeze of an infected person. What increases the risk? The condition is more likely to develop in:  People who havechronic diseases, such as chronic obstructive pulmonary disease (COPD), asthma, congestive heart failure, cystic fibrosis, diabetes, or kidney disease.  People who haveearly-stage or late-stage HIV.  People who havesickle cell disease.  People who havehad their spleen removed (splenectomy).  People who havepoor Human resources officer.  People who havemedical conditions that increase the risk of breathing in (aspirating) secretions their own mouth and nose.  People who havea weakened immune system (immunocompromised).  People who smoke.  People whotravel to areas where pneumonia-causing germs commonly exist.  People whoare around animal habitats or animals that have pneumonia-causing germs, including birds, bats, rabbits, cats, and farm animals.  What are the signs or symptoms? Symptoms of this condition include:  Adry cough.  A wet (productive) cough.  Fever.  Sweating.  Chest pain, especially when breathing deeply or coughing.  Rapid breathing or difficulty breathing.  Shortness of breath.  Shaking chills.  Fatigue.  Muscle aches.  How is this diagnosed? Your health care provider will take a medical history and perform a physical exam. You may  also have other tests, including:  Imaging studies of your chest, including X-rays.  Tests to check your blood oxygen level and other blood gases.  Other tests on blood, mucus (sputum), fluid around your lungs (pleural fluid), and urine.  If your pneumonia is severe, other tests may be done to identify the specific cause of your illness. How is this treated? The type of treatment that you receive depends on many factors, such as the cause of your pneumonia, the medicines you take, and other medical conditions that you have. For most adults, treatment and recovery from pneumonia may occur at home. In some cases, treatment must happen in a hospital. Treatment may include:  Antibiotic medicines, if the pneumonia was caused by bacteria.  Antiviral medicines, if the pneumonia was caused by a virus.  Medicines that are given by mouth or through an IV tube.  Oxygen.  Respiratory therapy.  Although rare, treating severe pneumonia may include:  Mechanical ventilation. This is done if you are not breathing well on your own and you cannot maintain a safe blood oxygen level.  Thoracentesis. This procedureremoves fluid around one lung or both lungs to help you breathe better.  Follow these instructions at home:  Take over-the-counter and prescription medicines only as told by your health care provider. ? Only takecough medicine if you are losing sleep. Understand that cough medicine can prevent your body's natural ability to remove mucus from your lungs. ? If you were prescribed an antibiotic medicine, take it as told by your health care provider. Do not stop taking the antibiotic even if you start to feel better.  Sleep in a semi-upright position at night. Try sleeping in a reclining chair, or place a few pillows under your head.  Do not  use tobacco products, including cigarettes, chewing tobacco, and e-cigarettes. If you need help quitting, ask your health care provider.  Drink enough  water to keep your urine clear or pale yellow. This will help to thin out mucus secretions in your lungs. How is this prevented? There are ways that you can decrease your risk of developing community-acquired pneumonia. Consider getting a pneumococcal vaccine if:  You are older than 48 years of age.  You are older than 48 years of age and are undergoing cancer treatment, have chronic lung disease, or have other medical conditions that affect your immune system. Ask your health care provider if this applies to you.  There are different types and schedules of pneumococcal vaccines. Ask your health care provider which vaccination option is best for you. You may also prevent community-acquired pneumonia if you take these actions:  Get an influenza vaccine every year. Ask your health care provider which type of influenza vaccine is best for you.  Go to the dentist on a regular basis.  Wash your hands often. Use hand sanitizer if soap and water are not available.  Contact a health care provider if:  You have a fever.  You are losing sleep because you cannot control your cough with cough medicine. Get help right away if:  You have worsening shortness of breath.  You have increased chest pain.  Your sickness becomes worse, especially if you are an older adult or have a weakened immune system.  You cough up blood. This information is not intended to replace advice given to you by your health care provider. Make sure you discuss any questions you have with your health care provider. Document Released: 05/21/2005 Document Revised: 09/29/2015 Document Reviewed: 09/15/2014 Elsevier Interactive Patient Education  2017 [employer].

## 2017-05-15 ENCOUNTER — Telehealth: Payer: Self-pay | Admitting: Physician Assistant

## 2017-05-15 DIAGNOSIS — I10 Essential (primary) hypertension: Secondary | ICD-10-CM

## 2017-05-15 NOTE — Telephone Encounter (Signed)
What is the name of the medication? Metformin and lisinopril  Have you contacted your pharmacy to request a refill? yes  Which pharmacy would you like this sent to? [facility], she got last refill today    Patient notified that their request is being sent to the clinical staff for review and that they should receive a call once it is complete. If they do not receive a call within 24 hours they can check with their pharmacy or our office.

## 2017-05-16 MED ORDER — LISINOPRIL 10 MG PO TABS: 10.0000 mg | ORAL_TABLET | Freq: Every day | ORAL | 0 refills | Status: DC

## 2017-05-16 MED ORDER — METFORMIN HCL 500 MG PO TABS: 500.0000 mg | ORAL_TABLET | Freq: Every day | ORAL | 0 refills | Status: DC

## 2017-05-16 NOTE — Telephone Encounter (Signed)
FU OV 07/10/17

## 2017-05-22 NOTE — Telephone Encounter (Signed)
Pt aware appt for Feb

## 2017-07-10 ENCOUNTER — Ambulatory Visit: Payer: Commercial Managed Care - PPO | Admitting: Physician Assistant

## 2017-07-10 ENCOUNTER — Encounter: Payer: Self-pay | Admitting: Physician Assistant

## 2017-07-10 VITALS — BP 127/83 | HR 103 | Temp 98.8°F | Ht 62.0 in | Wt 268.0 lb

## 2017-07-10 DIAGNOSIS — I10 Essential (primary) hypertension: Secondary | ICD-10-CM

## 2017-07-10 DIAGNOSIS — E785 Hyperlipidemia, unspecified: Secondary | ICD-10-CM

## 2017-07-10 DIAGNOSIS — E1169 Type 2 diabetes mellitus with other specified complication: Secondary | ICD-10-CM

## 2017-07-10 DIAGNOSIS — E119 Type 2 diabetes mellitus without complications: Secondary | ICD-10-CM

## 2017-07-10 LAB — BAYER DCA HB A1C WAIVED: HB A1C (BAYER DCA - WAIVED): 6 % (ref <7.0)

## 2017-07-10 MED ORDER — LISINOPRIL 10 MG PO TABS: 10.0000 mg | ORAL_TABLET | Freq: Every day | ORAL | 1 refills | Status: DC

## 2017-07-10 MED ORDER — METFORMIN HCL 500 MG PO TABS: 500.0000 mg | ORAL_TABLET | Freq: Every day | ORAL | 1 refills | Status: DC

## 2017-07-10 NOTE — Progress Notes (Signed)
BP 127/83   Pulse (!) 103   Temp 98.8 F (37.1 C) (Oral)   Ht 5' 2"  (1.575 m)   Wt 268 lb (121.6 kg)   BMI 49.02 kg/m    Subjective:    Patient ID: Nicole Santos, female    DOB: 1971/01/09, 47 y.o.   MRN: [id number]  HPI: Nicole Santos is a 47 y.o. female presenting on 07/10/2017 for Follow-up (6 month )  This patient comes in for periodic recheck on medications and conditions including she states overall she is doing well not having any complaints.  Denies any treatments for obstructive.  Patient and she understands.  She is occasionally having some cough and allergic.  Patient does use Benadryl.  She did have problems with alcohol in the past.   All medications are reviewed today. There are no reports of any problems with the medications. All of the medical conditions are reviewed and updated.  Lab work is reviewed and will be ordered as medically necessary. There are no new problems reported with today's visit.   Past Medical History:  Diagnosis Date  . Cancer ([place])   . Hypertension    Relevant past medical, surgical, family and social history reviewed and updated as indicated. Interim medical history since our last visit reviewed. Allergies and medications reviewed and updated. DATA REVIEWED: CHART IN EPIC  Family History reviewed for pertinent findings.  Review of Systems  Constitutional: Negative.  Negative for activity change, fatigue and fever.  HENT: Negative.   Eyes: Negative.   Respiratory: Negative.  Negative for cough.   Cardiovascular: Negative.  Negative for chest pain.  Gastrointestinal: Negative.  Negative for abdominal pain.  Endocrine: Negative.   Genitourinary: Negative.  Negative for dysuria.  Musculoskeletal: Negative.   Skin: Negative.   Neurological: Negative.     Allergies as of 07/10/2017      Reactions   Doxycycline    Abdominal cramps.      Medication List        Accurate as of 07/10/17  1:47 PM. Always use your most recent med list.          aspirin EC 81 MG tablet Take 81 mg by mouth daily.   atorvastatin 20 MG tablet Commonly known as:  LIPITOR Take 1 tablet (20 mg total) by mouth daily.   lisinopril 10 MG tablet Commonly known as:  PRINIVIL,ZESTRIL Take 1 tablet (10 mg total) by mouth daily.   metFORMIN 500 MG tablet Commonly known as:  GLUCOPHAGE Take 1 tablet (500 mg total) by mouth daily.   ranitidine 150 MG tablet Commonly known as:  ZANTAC Take 150 mg by mouth 2 (two) times daily. Takes PRN,  OTC          Objective:    BP 127/83   Pulse (!) 103   Temp 98.8 F (37.1 C) (Oral)   Ht 5' 2"  (1.575 m)   Wt 268 lb (121.6 kg)   BMI 49.02 kg/m   Allergies  Allergen Reactions  . Doxycycline     Abdominal cramps.    Wt Readings from Last 3 Encounters:  07/10/17 268 lb (121.6 kg)  04/04/17 277 lb 12.8 oz (126 kg)  01/07/17 282 lb (127.9 kg)    Physical Exam  Constitutional: She is oriented to person, place, and time. She appears well-developed and well-nourished.  HENT:  Head: Normocephalic and atraumatic.  Eyes: Conjunctivae and EOM are normal. Pupils are equal, round, and reactive to light.  Cardiovascular: Normal  rate, regular rhythm, normal heart sounds and intact distal pulses.  Pulmonary/Chest: Effort normal and breath sounds normal.  Abdominal: Soft. Bowel sounds are normal.  Neurological: She is alert and oriented to person, place, and time. She has normal reflexes.  Skin: Skin is warm and dry. No rash noted.  Psychiatric: She has a normal mood and affect. Her behavior is normal. Judgment and thought content normal.  Nursing note and vitals reviewed.   Results for orders placed or performed in visit on 07/10/17  Bayer DCA Hb A1c Waived  Result Value Ref Range   Bayer DCA Hb A1c Waived 6.0 <7.0 %      Assessment & Plan:   1. Essential hypertension - CMP14+EGFR - lisinopril (PRINIVIL,ZESTRIL) 10 MG tablet; Take 1 tablet (10 mg total) by mouth daily.  Dispense: 90 tablet; Refill:  1  2. Hyperlipidemia associated with type 2 diabetes mellitus (HCC) - Lipid panel - CMP14+EGFR  3. Diabetes mellitus without complication ([place]) - XBO12+RKYB - Bayer DCA Hb A1c Waived   Continue all other maintenance medications as listed above.  Follow up plan: Return in about 6 months (around 01/07/2018) for recheck.  Educational handout given for [place] PA-C [facility] [address]  [place], [place] [address] [phone]   07/10/2017, 1:47 PM

## 2017-07-10 NOTE — Patient Instructions (Signed)
In a few days you may receive a survey in the mail or online from [employer] regarding your visit with us today. Please take a moment to fill this out. Your feedback is very important to our whole office. It can help us better understand your needs as well as improve your experience and satisfaction. Thank you for taking your time to complete it. We care about you.  Myer Bohlman, PA-C  

## 2017-07-11 LAB — CMP14+EGFR
ALBUMIN: 4 g/dL (ref 3.5–5.5)
ALT: 22 IU/L (ref 0–32)
AST: 15 IU/L (ref 0–40)
Albumin/Globulin Ratio: 1.6 (ref 1.2–2.2)
Alkaline Phosphatase: 73 IU/L (ref 39–117)
BUN / CREAT RATIO: 15 (ref 9–23)
BUN: 11 mg/dL (ref 6–24)
Bilirubin Total: 0.6 mg/dL (ref 0.0–1.2)
CALCIUM: 9 mg/dL (ref 8.7–10.2)
CO2: 22 mmol/L (ref 20–29)
CREATININE: 0.71 mg/dL (ref 0.57–1.00)
Chloride: 104 mmol/L (ref 96–106)
GFR calc Af Amer: 116 mL/min/{1.73_m2} (ref >59)
GFR, EST NON AFRICAN AMERICAN: 101 mL/min/{1.73_m2} (ref >59)
GLOBULIN, TOTAL: 2.5 g/dL (ref 1.5–4.5)
GLUCOSE: 130 mg/dL — AB (ref 65–99)
Potassium: 4.2 mmol/L (ref 3.5–5.2)
SODIUM: 140 mmol/L (ref 134–144)
Total Protein: 6.5 g/dL (ref 6.0–8.5)

## 2017-07-11 LAB — LIPID PANEL
CHOLESTEROL TOTAL: 130 mg/dL (ref 100–199)
Chol/HDL Ratio: 2.5 ratio (ref 0.0–4.4)
HDL: 53 mg/dL (ref >39)
LDL Calculated: 63 mg/dL (ref 0–99)
Triglycerides: 68 mg/dL (ref 0–149)
VLDL Cholesterol Cal: 14 mg/dL (ref 5–40)

## 2017-08-30 ENCOUNTER — Encounter: Payer: Self-pay | Admitting: Family Medicine

## 2017-08-30 ENCOUNTER — Ambulatory Visit: Payer: Commercial Managed Care - PPO | Admitting: Family Medicine

## 2017-08-30 VITALS — BP 130/91 | HR 123 | Temp 98.6°F | Ht 62.0 in | Wt 266.8 lb

## 2017-08-30 DIAGNOSIS — R509 Fever, unspecified: Secondary | ICD-10-CM | POA: Diagnosis not present

## 2017-08-30 DIAGNOSIS — J020 Streptococcal pharyngitis: Secondary | ICD-10-CM | POA: Diagnosis not present

## 2017-08-30 LAB — VERITOR FLU A/B WAIVED
Influenza A: NEGATIVE
Influenza B: NEGATIVE

## 2017-08-30 LAB — RAPID STREP SCREEN (MED CTR MEBANE ONLY): STREP GP A AG, IA W/REFLEX: POSITIVE — AB

## 2017-08-30 MED ORDER — AMOXICILLIN-POT CLAVULANATE 875-125 MG PO TABS: 1.0000 | ORAL_TABLET | Freq: Two times a day (BID) | ORAL | 0 refills | Status: DC

## 2017-08-30 NOTE — Progress Notes (Signed)
   HPI  Patient presents today acute illness.  Patient explains she has been ill for about 2 days.  She began with sore throat and then headache developed.  She describes bitemporal and frontal headache that is throbbing and persistent.  She is also had nasal congestion and fever measured at 102 yesterday.  She is tolerating food and fluids like usual. Denies any shortness of breath.  Patient was seen for pneumonia in November and states that she has had off-and-on head cold since that time.  PMH: Smoking status noted ROS: Per HPI  Objective: BP (!) 130/91   Pulse (!) 123   Temp 98.6 F (37 C) (Oral)   Ht 5\' 2"  (1.575 m)   Wt 266 lb 12.8 oz (121 kg)   SpO2 99%   BMI 48.80 kg/m  Gen: NAD, alert, cooperative with exam HEENT: NCAT, pharynx with erythema and scattered vesicles over the soft palate, no enlarged tonsils,  tenderness to palpation of bilateral frontal sinuses CV: RRR, good S1/S2, no murmur Resp: CTABL, no wheezes, non-labored Ext: No edema, warm Neuro: Alert and oriented, No gross deficits  Assessment and plan:  #Strep pharyngitis With likely developing sinus infection Treat with Augmentin Discussed usual course of illness, return to clinic with any concerns    Orders Placed This Encounter  Procedures  . Culture, Group A Strep    Order Specific Question:   Source    Answer:   throat  . Rapid Strep Screen (Not at [facility])  . Veritor Flu A/B Waived    Order Specific Question:   Source    Answer:   nasal    Meds ordered this encounter  Medications  . amoxicillin-clavulanate (AUGMENTIN) 875-125 MG tablet    Sig: Take 1 tablet by mouth 2 (two) times daily.    Dispense:  20 tablet    Refill:  0    Laroy Apple, MD [facility] 08/30/2017, 9:23 AM

## 2017-08-30 NOTE — Patient Instructions (Signed)
Great to see you!   Strep Throat Strep throat is an infection of the throat. It is caused by germs. Strep throat spreads from person to person because of coughing, sneezing, or close contact. Follow these instructions at home: Medicines  Take over-the-counter and prescription medicines only as told by your doctor.  Take your antibiotic medicine as told by your doctor. Do not stop taking the medicine even if you feel better.  Have family members who also have a sore throat or fever go to a doctor. Eating and drinking  Do not share food, drinking cups, or personal items.  Try eating soft foods until your sore throat feels better.  Drink enough fluid to keep your pee (urine) clear or pale yellow. General instructions  Rinse your mouth (gargle) with a salt-water mixture 3-4 times per day or as needed. To make a salt-water mixture, stir -1 tsp of salt into 1 cup of warm water.  Make sure that all people in your house wash their hands well.  Rest.  Stay home from school or work until you have been taking antibiotics for 24 hours.  Keep all follow-up visits as told by your doctor. This is important. Contact a doctor if:  Your neck keeps getting bigger.  You get a rash, cough, or earache.  You cough up thick liquid that is green, yellow-brown, or bloody.  You have pain that does not get better with medicine.  Your problems get worse instead of getting better.  You have a fever. Get help right away if:  You throw up (vomit).  You get a very bad headache.  You neck hurts or it feels stiff.  You have chest pain or you are short of breath.  You have drooling, very bad throat pain, or changes in your voice.  Your neck is swollen or the skin gets red and tender.  Your mouth is dry or you are peeing less than normal.  You keep feeling more tired or it is hard to wake up.  Your joints are red or they hurt. This information is not intended to replace advice given to you  by your health care provider. Make sure you discuss any questions you have with your health care provider. Document Released: 11/07/2007 Document Revised: 01/18/2016 Document Reviewed: 09/13/2014 Elsevier Interactive Patient Education  2018 [employer] Inc[employer]  

## 2017-09-12 ENCOUNTER — Encounter: Payer: Self-pay | Admitting: Pediatrics

## 2017-09-12 ENCOUNTER — Ambulatory Visit: Payer: Commercial Managed Care - PPO | Admitting: Pediatrics

## 2017-09-12 VITALS — BP 126/71 | HR 88 | Temp 98.2°F | Ht 62.0 in | Wt 266.4 lb

## 2017-09-12 DIAGNOSIS — J069 Acute upper respiratory infection, unspecified: Secondary | ICD-10-CM

## 2017-09-12 NOTE — Patient Instructions (Signed)
Fever reducer and headache: tylenol and ibuprofen, can take together or alternating   Sinus pressure or ear pain:  Nasal steroid such as flonase/fluticaone or nasocort daily Can also take daily antihistamine such as loratadine/claritin or cetirizine/zyrtec  Sinus rinses/irritation: Netipot or similar with distilled water 2-3 times a day to clear out sinuses or Normal saline nasal spray  Sore throat:  Throat lozenges chloroseptic spray  Stick with bland foods Drink lots of fluids

## 2017-09-12 NOTE — Progress Notes (Signed)
  Subjective:   Patient ID: Nicole Santos, female    DOB: 1970-06-30, 47 y.o.   MRN: [id number] CC: Ear Pain; Sore Throat; and Cough  HPI: Nicole Santos is a 47 y.o. female presenting for Ear Pain; Sore Throat; and Cough  Treated with for strep pharyngitis and sinusitis 2 weeks ago.  Finished 8 days of antibiotics, had a lot of stomach burning with the antibiotics per patient so she can finish the last 2 days.  Her sore throat stopped for a week.  Started having some return of the sore throat with some coughing starting 3 days ago.  in clinic 2 weeks ago, she was found to have vesicles in the back of her throat.  Primarily on the right side.  Her throat hurts on both sides now, but more on the right side.  Her ears feel slightly sore.  No sinus congestion now.  She has had a dry cough off and on for the last few days.  Some low-grade temperatures starting a few days ago.  No fevers today.  Appetite is been okay.  Relevant past medical, surgical, family and social history reviewed. Allergies and medications reviewed and updated. Social History   Tobacco Use  Smoking Status Never Smoker  Smokeless Tobacco Never Used   ROS: Per HPI   Objective:    BP 126/71   Pulse 88   Temp 98.2 F (36.8 C) (Oral)   Ht 5\' 2"  (1.575 m)   Wt 266 lb 6.4 oz (120.8 kg)   BMI 48.73 kg/m   Wt Readings from Last 3 Encounters:  09/12/17 266 lb 6.4 oz (120.8 kg)  08/30/17 266 lb 12.8 oz (121 kg)  07/10/17 268 lb (121.6 kg)    Gen: NAD, alert, cooperative with exam, NCAT EYES: EOMI, no conjunctival injection, or no icterus ENT:  TMs pearly gray b/l, OP with 2 shallow ulcerations right tonsillar pillar LYMPH: no cervical LAD CV: NRRR, normal S1/S2, no murmur, distal pulses 2+ b/l Resp: CTABL, no wheezes, normal WOB Abd: +BS, soft, NTND. no guarding or organomegaly Ext: No edema, warm Neuro: Alert and oriented  Assessment & Plan:  Nicole Santos was seen today for ear pain, sore throat and cough.  Diagnoses and all  orders for this visit:  Acute URI Normal lung exam, normal ear exam.  Offered to retest for strep.  Patient declined.  Will treat as URI, discussed symptomatic care and return precautions.  Follow up plan: Return if symptoms worsen or fail to improve. Assunta Found, MD [place]

## 2017-12-21 ENCOUNTER — Other Ambulatory Visit: Payer: Self-pay | Admitting: Physician Assistant

## 2017-12-21 DIAGNOSIS — E1169 Type 2 diabetes mellitus with other specified complication: Secondary | ICD-10-CM

## 2017-12-21 DIAGNOSIS — E785 Hyperlipidemia, unspecified: Principal | ICD-10-CM

## 2018-01-02 ENCOUNTER — Other Ambulatory Visit: Payer: Self-pay | Admitting: Physician Assistant

## 2018-01-02 DIAGNOSIS — E785 Hyperlipidemia, unspecified: Principal | ICD-10-CM

## 2018-01-02 DIAGNOSIS — E1169 Type 2 diabetes mellitus with other specified complication: Secondary | ICD-10-CM

## 2018-02-21 ENCOUNTER — Other Ambulatory Visit: Payer: Self-pay | Admitting: Physician Assistant

## 2018-02-21 DIAGNOSIS — I10 Essential (primary) hypertension: Secondary | ICD-10-CM

## 2018-03-12 ENCOUNTER — Ambulatory Visit: Payer: Commercial Managed Care - PPO | Admitting: Physician Assistant

## 2018-03-12 ENCOUNTER — Encounter: Payer: Self-pay | Admitting: Physician Assistant

## 2018-03-12 VITALS — BP 134/89 | HR 72 | Temp 99.6°F | Ht 62.0 in | Wt 275.2 lb

## 2018-03-12 DIAGNOSIS — R300 Dysuria: Secondary | ICD-10-CM | POA: Diagnosis not present

## 2018-03-12 DIAGNOSIS — N3001 Acute cystitis with hematuria: Secondary | ICD-10-CM | POA: Diagnosis not present

## 2018-03-12 LAB — MICROSCOPIC EXAMINATION

## 2018-03-12 LAB — URINALYSIS, ROUTINE W REFLEX MICROSCOPIC
Bilirubin, UA: NEGATIVE
Glucose, UA: NEGATIVE
Ketones, UA: NEGATIVE
NITRITE UA: POSITIVE — AB
PH UA: 6.5 (ref 5.0–7.5)
Specific Gravity, UA: 1.02 (ref 1.005–1.030)
UUROB: 0.2 mg/dL (ref 0.2–1.0)

## 2018-03-12 MED ORDER — SULFAMETHOXAZOLE-TRIMETHOPRIM 800-160 MG PO TABS: 1.0000 | ORAL_TABLET | Freq: Two times a day (BID) | ORAL | 0 refills | Status: DC

## 2018-03-12 NOTE — Patient Instructions (Signed)
Tagamet or pepcid

## 2018-03-12 NOTE — Progress Notes (Signed)
BP 134/89   Pulse 72   Temp 99.6 F (37.6 C) (Oral)   Ht 5\' 2"  (1.575 m)   Wt 275 lb 3.2 oz (124.8 kg)   BMI 50.33 kg/m    Subjective:    Patient ID: Nicole Santos, female    DOB: 14-Jul-1970, 47 y.o.   MRN: [id number]  HPI: Nicole Santos is a 47 y.o. female presenting on 03/12/2018 for Urinary Tract Infection (RLQ pain started last Tuesday, urinary urgency and frequency)  This patient has had several days of dysuria, frequency and nocturia. There is also pain over the bladder in the suprapubic region, no back pain. Denies leakage or hematuria.  Denies fever or chills. No pain in flank area.   Past Medical History:  Diagnosis Date  . Cancer ([place])   . Hypertension    Relevant past medical, surgical, family and social history reviewed and updated as indicated. Interim medical history since our last visit reviewed. Allergies and medications reviewed and updated. DATA REVIEWED: CHART IN EPIC  Family History reviewed for pertinent findings.  Review of Systems  Constitutional: Negative.   HENT: Negative.   Eyes: Negative.   Respiratory: Negative.   Gastrointestinal: Negative.   Genitourinary: Positive for difficulty urinating, dysuria and urgency. Negative for flank pain.    Allergies as of 03/12/2018      Reactions   Augmentin [amoxicillin-pot Clavulanate] Diarrhea, Nausea And Vomiting   Doxycycline    Abdominal cramps.      Medication List        Accurate as of 03/12/18 10:10 PM. Always use your most recent med list.          aspirin EC 81 MG tablet Take 81 mg by mouth daily.   atorvastatin 20 MG tablet Commonly known as:  LIPITOR TAKE ONE TABLET BY MOUTH EVERY DAY   lisinopril 10 MG tablet Commonly known as:  PRINIVIL,ZESTRIL Take 1 tablet (10 mg total) by mouth daily. (Needs to be seen)   metFORMIN 500 MG tablet Commonly known as:  GLUCOPHAGE Take 1 tablet (500 mg total) by mouth daily.   ranitidine 150 MG tablet Commonly known as:  ZANTAC Take 150 mg by  mouth 2 (two) times daily. Takes PRN,  OTC   sulfamethoxazole-trimethoprim 800-160 MG tablet Commonly known as:  BACTRIM DS,SEPTRA DS Take 1 tablet by mouth 2 (two) times daily.          Objective:    BP 134/89   Pulse 72   Temp 99.6 F (37.6 C) (Oral)   Ht 5\' 2"  (1.575 m)   Wt 275 lb 3.2 oz (124.8 kg)   BMI 50.33 kg/m   Allergies  Allergen Reactions  . Augmentin [Amoxicillin-Pot Clavulanate] Diarrhea and Nausea And Vomiting  . Doxycycline     Abdominal cramps.    Wt Readings from Last 3 Encounters:  03/12/18 275 lb 3.2 oz (124.8 kg)  09/12/17 266 lb 6.4 oz (120.8 kg)  08/30/17 266 lb 12.8 oz (121 kg)    Physical Exam  Constitutional: She is oriented to person, place, and time. She appears well-developed and well-nourished.  HENT:  Head: Normocephalic and atraumatic.  Eyes: Pupils are equal, round, and reactive to light. Conjunctivae are normal.  Cardiovascular: Normal rate, regular rhythm, normal heart sounds and intact distal pulses.  Pulmonary/Chest: Effort normal and breath sounds normal.  Abdominal: Soft. Bowel sounds are normal. She exhibits no distension and no mass. There is tenderness in the suprapubic area. There is no rebound, no  guarding and no CVA tenderness.  Neurological: She is alert and oriented to person, place, and time. She has normal reflexes.  Skin: Skin is warm and dry. No rash noted.  Psychiatric: She has a normal mood and affect. Her behavior is normal. Judgment and thought content normal.    Results for orders placed or performed in visit on 03/12/18  Microscopic Examination  Result Value Ref Range   WBC, UA 11-30 (A) 0 - 5 /hpf   RBC, UA 3-10 (A) 0 - 2 /hpf   Epithelial Cells (non renal) 0-10 0 - 10 /hpf   Renal Epithel, UA 0-10 (A) None seen /hpf   Bacteria, UA Many (A) None seen/Few  Urinalysis, Routine w reflex microscopic  Result Value Ref Range   Specific Gravity, UA 1.020 1.005 - 1.030   pH, UA 6.5 5.0 - 7.5   Color, UA Yellow  Yellow   Appearance Ur Clear Clear   Leukocytes, UA 1+ (A) Negative   Protein, UA Trace (A) Negative/Trace   Glucose, UA Negative Negative   Ketones, UA Negative Negative   RBC, UA Trace (A) Negative   Bilirubin, UA Negative Negative   Urobilinogen, Ur 0.2 0.2 - 1.0 mg/dL   Nitrite, UA Positive (A) Negative   Microscopic Examination See below:       Assessment & Plan:   1. Dysuria - Urinalysis, Routine w reflex microscopic - Microscopic Examination - Urine Culture  2. Acute cystitis with hematuria - sulfamethoxazole-trimethoprim (BACTRIM DS) 800-160 MG tablet; Take 1 tablet by mouth 2 (two) times daily.  Dispense: 20 tablet; Refill: 0 - Microscopic Examination   Continue all other maintenance medications as listed above.  Follow up plan: No follow-ups on file.  Educational handout given for [place] PA-C [facility] [address]  [place], [place] [address] [phone]   03/12/2018, 10:10 PM

## 2018-03-15 LAB — URINE CULTURE

## 2018-03-23 ENCOUNTER — Other Ambulatory Visit: Payer: Self-pay | Admitting: Physician Assistant

## 2018-03-23 DIAGNOSIS — I10 Essential (primary) hypertension: Secondary | ICD-10-CM

## 2018-03-23 DIAGNOSIS — E1169 Type 2 diabetes mellitus with other specified complication: Secondary | ICD-10-CM

## 2018-03-23 DIAGNOSIS — E785 Hyperlipidemia, unspecified: Principal | ICD-10-CM

## 2018-04-22 ENCOUNTER — Other Ambulatory Visit: Payer: Self-pay | Admitting: Physician Assistant

## 2018-04-22 DIAGNOSIS — E1169 Type 2 diabetes mellitus with other specified complication: Secondary | ICD-10-CM

## 2018-04-22 DIAGNOSIS — E785 Hyperlipidemia, unspecified: Secondary | ICD-10-CM

## 2018-04-22 DIAGNOSIS — I10 Essential (primary) hypertension: Secondary | ICD-10-CM

## 2018-07-11 ENCOUNTER — Other Ambulatory Visit: Payer: Self-pay | Admitting: Physician Assistant

## 2018-07-11 DIAGNOSIS — E1169 Type 2 diabetes mellitus with other specified complication: Secondary | ICD-10-CM

## 2018-07-11 DIAGNOSIS — E785 Hyperlipidemia, unspecified: Principal | ICD-10-CM

## 2018-08-10 ENCOUNTER — Other Ambulatory Visit: Payer: Self-pay | Admitting: Physician Assistant

## 2018-08-10 DIAGNOSIS — E1169 Type 2 diabetes mellitus with other specified complication: Secondary | ICD-10-CM

## 2018-08-10 DIAGNOSIS — E785 Hyperlipidemia, unspecified: Principal | ICD-10-CM

## 2018-08-11 NOTE — Telephone Encounter (Signed)
Jones. NTBS 30 days given 07/11/18

## 2018-08-11 NOTE — Telephone Encounter (Signed)
Patient is schedule with provider later this month.

## 2018-08-12 ENCOUNTER — Other Ambulatory Visit: Payer: Self-pay | Admitting: Physician Assistant

## 2018-08-12 DIAGNOSIS — E1169 Type 2 diabetes mellitus with other specified complication: Secondary | ICD-10-CM

## 2018-08-12 DIAGNOSIS — E785 Hyperlipidemia, unspecified: Principal | ICD-10-CM

## 2018-08-22 ENCOUNTER — Ambulatory Visit: Payer: Commercial Managed Care - PPO | Admitting: Physician Assistant

## 2018-08-22 ENCOUNTER — Telehealth: Payer: Self-pay | Admitting: Physician Assistant

## 2018-08-22 ENCOUNTER — Encounter: Payer: Self-pay | Admitting: Physician Assistant

## 2018-08-22 ENCOUNTER — Other Ambulatory Visit: Payer: Self-pay

## 2018-08-22 VITALS — BP 114/79 | HR 97 | Temp 98.1°F | Ht 62.0 in | Wt 275.6 lb

## 2018-08-22 DIAGNOSIS — R635 Abnormal weight gain: Secondary | ICD-10-CM

## 2018-08-22 DIAGNOSIS — I10 Essential (primary) hypertension: Secondary | ICD-10-CM | POA: Diagnosis not present

## 2018-08-22 DIAGNOSIS — E785 Hyperlipidemia, unspecified: Secondary | ICD-10-CM | POA: Diagnosis not present

## 2018-08-22 DIAGNOSIS — E1169 Type 2 diabetes mellitus with other specified complication: Secondary | ICD-10-CM | POA: Diagnosis not present

## 2018-08-22 MED ORDER — ATORVASTATIN CALCIUM 20 MG PO TABS: 20.0000 mg | ORAL_TABLET | Freq: Every day | ORAL | 3 refills | Status: DC

## 2018-08-22 MED ORDER — METFORMIN HCL 500 MG PO TABS: 500.0000 mg | ORAL_TABLET | Freq: Every day | ORAL | 3 refills | Status: DC

## 2018-08-22 MED ORDER — PHENTERMINE HCL 37.5 MG PO CAPS: 37.5000 mg | ORAL_CAPSULE | ORAL | 1 refills | Status: DC

## 2018-08-22 MED ORDER — LISINOPRIL 10 MG PO TABS: ORAL_TABLET | ORAL | 3 refills | Status: DC

## 2018-08-22 NOTE — Telephone Encounter (Signed)
Rxs changed to 90

## 2018-08-23 LAB — LIPID PANEL
Chol/HDL Ratio: 2.9 ratio (ref 0.0–4.4)
Cholesterol, Total: 135 mg/dL (ref 100–199)
HDL: 47 mg/dL (ref >39)
LDL Calculated: 74 mg/dL (ref 0–99)
TRIGLYCERIDES: 70 mg/dL (ref 0–149)
VLDL Cholesterol Cal: 14 mg/dL (ref 5–40)

## 2018-08-23 LAB — CMP14+EGFR
ALBUMIN: 4.1 g/dL (ref 3.8–4.8)
ALT: 20 IU/L (ref 0–32)
AST: 12 IU/L (ref 0–40)
Albumin/Globulin Ratio: 1.7 (ref 1.2–2.2)
Alkaline Phosphatase: 64 IU/L (ref 39–117)
BUN/Creatinine Ratio: 16 (ref 9–23)
BUN: 15 mg/dL (ref 6–24)
Bilirubin Total: 0.8 mg/dL (ref 0.0–1.2)
CO2: 22 mmol/L (ref 20–29)
Calcium: 8.7 mg/dL (ref 8.7–10.2)
Chloride: 102 mmol/L (ref 96–106)
Creatinine, Ser: 0.94 mg/dL (ref 0.57–1.00)
GFR calc Af Amer: 82 mL/min/{1.73_m2} (ref >59)
GFR calc non Af Amer: 71 mL/min/{1.73_m2} (ref >59)
GLUCOSE: 117 mg/dL — AB (ref 65–99)
Globulin, Total: 2.4 g/dL (ref 1.5–4.5)
Potassium: 4.8 mmol/L (ref 3.5–5.2)
Sodium: 141 mmol/L (ref 134–144)
Total Protein: 6.5 g/dL (ref 6.0–8.5)

## 2018-08-23 LAB — CBC WITH DIFFERENTIAL/PLATELET
Basophils Absolute: 0 10*3/uL (ref 0.0–0.2)
Basos: 0 %
EOS (ABSOLUTE): 0.5 10*3/uL — ABNORMAL HIGH (ref 0.0–0.4)
Eos: 10 %
HEMOGLOBIN: 14.4 g/dL (ref 11.1–15.9)
Hematocrit: 44.8 % (ref 34.0–46.6)
Immature Grans (Abs): 0 10*3/uL (ref 0.0–0.1)
Immature Granulocytes: 1 %
Lymphocytes Absolute: 1.7 10*3/uL (ref 0.7–3.1)
Lymphs: 32 %
MCH: 29.9 pg (ref 26.6–33.0)
MCHC: 32.1 g/dL (ref 31.5–35.7)
MCV: 93 fL (ref 79–97)
MONOCYTES: 8 %
Monocytes Absolute: 0.4 10*3/uL (ref 0.1–0.9)
NEUTROS ABS: 2.6 10*3/uL (ref 1.4–7.0)
Neutrophils: 49 %
Platelets: 323 10*3/uL (ref 150–450)
RBC: 4.81 x10E6/uL (ref 3.77–5.28)
RDW: 12.7 % (ref 11.7–15.4)
WBC: 5.3 10*3/uL (ref 3.4–10.8)

## 2018-08-23 LAB — TSH: TSH: 0.857 u[IU]/mL (ref 0.450–4.500)

## 2018-08-24 DIAGNOSIS — R635 Abnormal weight gain: Secondary | ICD-10-CM | POA: Insufficient documentation

## 2018-08-24 NOTE — Progress Notes (Signed)
BP 114/79   Pulse 97   Temp 98.1 F (36.7 C) (Oral)   Ht 5' 2"  (1.575 m)   Wt 275 lb 9.6 oz (125 kg)   BMI 50.41 kg/m    Subjective:    Patient ID: Nicole Santos, female    DOB: 1970-08-09, 48 y.o.   MRN: [id number]  HPI: Nicole Santos is a 48 y.o. female presenting on 08/22/2018 for Diabetes and Hypertension  This patient comes in for periodic recheck on medications and conditions including diabetes, hyperlipidemia, hypertension.  She is concerned also about her weight gain.  We have discussion about calorie and exercise programs. She is motivated to get her weight improved..   All medications are reviewed today. There are no reports of any problems with the medications. All of the medical conditions are reviewed and updated.  Lab work is reviewed and will be ordered as medically necessary. There are no new problems reported with today's visit.   Past Medical History:  Diagnosis Date  . Cancer ([place])   . Hypertension    Relevant past medical, surgical, family and social history reviewed and updated as indicated. Interim medical history since our last visit reviewed. Allergies and medications reviewed and updated. DATA REVIEWED: CHART IN EPIC  Family History reviewed for pertinent findings.  Review of Systems  Constitutional: Negative.   HENT: Negative.   Eyes: Negative.   Respiratory: Negative.   Gastrointestinal: Negative.   Genitourinary: Negative.     Allergies as of 08/22/2018      Reactions   Augmentin [amoxicillin-pot Clavulanate] Diarrhea, Nausea And Vomiting   Doxycycline    Abdominal cramps.      Medication List       Accurate as of August 22, 2018 11:59 PM. Always use your most recent med list.        aspirin EC 81 MG tablet Take 81 mg by mouth daily.   atorvastatin 20 MG tablet Commonly known as:  LIPITOR Take 1 tablet (20 mg total) by mouth daily.   cimetidine 200 MG tablet Commonly known as:  TAGAMET Take 200 mg by mouth 2 (two) times daily.    lisinopril 10 MG tablet Commonly known as:  PRINIVIL,ZESTRIL TAKE 1 TABLET BY MOUTH DAILY (NEEDS TO BE SEEN)   metFORMIN 500 MG tablet Commonly known as:  GLUCOPHAGE Take 1 tablet (500 mg total) by mouth daily.   phentermine 37.5 MG capsule Take 1 capsule (37.5 mg total) by mouth every morning.          Objective:    BP 114/79   Pulse 97   Temp 98.1 F (36.7 C) (Oral)   Ht 5' 2"  (1.575 m)   Wt 275 lb 9.6 oz (125 kg)   BMI 50.41 kg/m   Allergies  Allergen Reactions  . Augmentin [Amoxicillin-Pot Clavulanate] Diarrhea and Nausea And Vomiting  . Doxycycline     Abdominal cramps.    Wt Readings from Last 3 Encounters:  08/22/18 275 lb 9.6 oz (125 kg)  03/12/18 275 lb 3.2 oz (124.8 kg)  09/12/17 266 lb 6.4 oz (120.8 kg)    Physical Exam Constitutional:      Appearance: She is well-developed.  HENT:     Head: Normocephalic and atraumatic.  Eyes:     Conjunctiva/sclera: Conjunctivae normal.     Pupils: Pupils are equal, round, and reactive to light.  Cardiovascular:     Rate and Rhythm: Normal rate and regular rhythm.     Heart sounds: Normal  heart sounds.  Pulmonary:     Effort: Pulmonary effort is normal.     Breath sounds: Normal breath sounds.  Abdominal:     General: Bowel sounds are normal.     Palpations: Abdomen is soft.  Skin:    General: Skin is warm and dry.     Findings: No rash.  Neurological:     Mental Status: She is alert and oriented to person, place, and time.     Deep Tendon Reflexes: Reflexes are normal and symmetric.  Psychiatric:        Behavior: Behavior normal.        Thought Content: Thought content normal.        Judgment: Judgment normal.     Results for orders placed or performed in visit on 08/22/18  CMP14+EGFR  Result Value Ref Range   Glucose 117 (H) 65 - 99 mg/dL   BUN 15 6 - 24 mg/dL   Creatinine, Ser 0.94 0.57 - 1.00 mg/dL   GFR calc non Af Amer 71 >59 mL/min/1.73   GFR calc Af Amer 82 >59 mL/min/1.73    BUN/Creatinine Ratio 16 9 - 23   Sodium 141 134 - 144 mmol/L   Potassium 4.8 3.5 - 5.2 mmol/L   Chloride 102 96 - 106 mmol/L   CO2 22 20 - 29 mmol/L   Calcium 8.7 8.7 - 10.2 mg/dL   Total Protein 6.5 6.0 - 8.5 g/dL   Albumin 4.1 3.8 - 4.8 g/dL   Globulin, Total 2.4 1.5 - 4.5 g/dL   Albumin/Globulin Ratio 1.7 1.2 - 2.2   Bilirubin Total 0.8 0.0 - 1.2 mg/dL   Alkaline Phosphatase 64 39 - 117 IU/L   AST 12 0 - 40 IU/L   ALT 20 0 - 32 IU/L  CBC with Differential/Platelet  Result Value Ref Range   WBC 5.3 3.4 - 10.8 x10E3/uL   RBC 4.81 3.77 - 5.28 x10E6/uL   Hemoglobin 14.4 11.1 - 15.9 g/dL   Hematocrit 44.8 34.0 - 46.6 %   MCV 93 79 - 97 fL   MCH 29.9 26.6 - 33.0 pg   MCHC 32.1 31.5 - 35.7 g/dL   RDW 12.7 11.7 - 15.4 %   Platelets 323 150 - 450 x10E3/uL   Neutrophils 49 Not Estab. %   Lymphs 32 Not Estab. %   Monocytes 8 Not Estab. %   Eos 10 Not Estab. %   Basos 0 Not Estab. %   Neutrophils Absolute 2.6 1.4 - 7.0 x10E3/uL   Lymphocytes Absolute 1.7 0.7 - 3.1 x10E3/uL   Monocytes Absolute 0.4 0.1 - 0.9 x10E3/uL   EOS (ABSOLUTE) 0.5 (H) 0.0 - 0.4 x10E3/uL   Basophils Absolute 0.0 0.0 - 0.2 x10E3/uL   Immature Granulocytes 1 Not Estab. %   Immature Grans (Abs) 0.0 0.0 - 0.1 x10E3/uL  Lipid panel  Result Value Ref Range   Cholesterol, Total 135 100 - 199 mg/dL   Triglycerides 70 0 - 149 mg/dL   HDL 47 >39 mg/dL   VLDL Cholesterol Cal 14 5 - 40 mg/dL   LDL Calculated 74 0 - 99 mg/dL   Chol/HDL Ratio 2.9 0.0 - 4.4 ratio  TSH  Result Value Ref Range   TSH 0.857 0.450 - 4.500 uIU/mL      Assessment & Plan:   1. Essential hypertension - lisinopril (PRINIVIL,ZESTRIL) 10 MG tablet; TAKE 1 TABLET BY MOUTH DAILY (NEEDS TO BE SEEN)  Dispense: 90 tablet; Refill: 3 - CMP14+EGFR - CBC with  Differential/Platelet - Lipid panel - TSH  2. Hyperlipidemia associated with type 2 diabetes mellitus (HCC) - atorvastatin (LIPITOR) 20 MG tablet; Take 1 tablet (20 mg total) by mouth daily.   Dispense: 90 tablet; Refill: 3 - CMP14+EGFR - CBC with Differential/Platelet - Lipid panel - TSH  3. Weight gain - phentermine 37.5 MG capsule; Take 1 capsule (37.5 mg total) by mouth every morning.  Dispense: 30 capsule; Refill: 1   Continue all other maintenance medications as listed above.  Follow up plan: Return in about 2 months (around 10/22/2018).  Educational handout given for diet  Terald Sleeper PA-C [facility] [address]  [place], [place] [address] [phone]   08/24/2018, 7:32 PM

## 2018-08-24 NOTE — Patient Instructions (Signed)
Breakfast: eggs 2-3 Or greek yogurt low fat DANNON 1 slice delightful [employer] bread  Lunch: 2 slice [employer] delightfully bread       Or Nature's Own Light 4 ounces chicken, turkey, roast beef 1 slice Thin sliced cheese S[employer]ento Mustard ok 1 piece of fruit  Supper: 6 ounces lean meat 2 cups raw/cooked veg or 1 cup pintos, corn lima  3 snacks 100 calories or less  

## 2018-09-19 ENCOUNTER — Telehealth: Payer: Self-pay | Admitting: Physician Assistant

## 2018-09-19 NOTE — Telephone Encounter (Signed)
Pt aware insurance will do 30 days at [facility], the 90 days is probably if she goes through mail order with [facility]. Pt was not told this from insurance company that she had to go through mail order, just that 90 days was cheaper. She will call [facility] back and just keep refills there

## 2019-02-02 ENCOUNTER — Inpatient Hospital Stay (HOSPITAL_COMMUNITY)
Admission: EM | Admit: 2019-02-02 | Discharge: 2019-02-09 | DRG: 177 | Disposition: A | Discharge status: Home / Self Care | Payer: Commercial Managed Care - PPO | Attending: Internal Medicine | Admitting: Internal Medicine

## 2019-02-02 ENCOUNTER — Ambulatory Visit (INDEPENDENT_AMBULATORY_CARE_PROVIDER_SITE_OTHER): Payer: Commercial Managed Care - PPO | Admitting: Family Medicine

## 2019-02-02 ENCOUNTER — Emergency Department (HOSPITAL_COMMUNITY)
Admission: EM | Admit: 2019-02-02 | Discharge: 2019-02-02 | Disposition: A | Discharge status: Home / Self Care | Payer: Self-pay

## 2019-02-02 ENCOUNTER — Other Ambulatory Visit: Payer: Self-pay

## 2019-02-02 ENCOUNTER — Emergency Department (HOSPITAL_COMMUNITY): Payer: Commercial Managed Care - PPO

## 2019-02-02 ENCOUNTER — Encounter (HOSPITAL_COMMUNITY): Payer: Self-pay | Admitting: Emergency Medicine

## 2019-02-02 DIAGNOSIS — R6889 Other general symptoms and signs: Secondary | ICD-10-CM | POA: Diagnosis not present

## 2019-02-02 DIAGNOSIS — J9601 Acute respiratory failure with hypoxia: Secondary | ICD-10-CM

## 2019-02-02 DIAGNOSIS — Z8541 Personal history of malignant neoplasm of cervix uteri: Secondary | ICD-10-CM | POA: Diagnosis not present

## 2019-02-02 DIAGNOSIS — Z7984 Long term (current) use of oral hypoglycemic drugs: Secondary | ICD-10-CM

## 2019-02-02 DIAGNOSIS — Y92230 Patient room in hospital as the place of occurrence of the external cause: Secondary | ICD-10-CM | POA: Diagnosis not present

## 2019-02-02 DIAGNOSIS — Z880 Allergy status to penicillin: Secondary | ICD-10-CM | POA: Diagnosis not present

## 2019-02-02 DIAGNOSIS — T380X5A Adverse effect of glucocorticoids and synthetic analogues, initial encounter: Secondary | ICD-10-CM | POA: Diagnosis not present

## 2019-02-02 DIAGNOSIS — E1165 Type 2 diabetes mellitus with hyperglycemia: Secondary | ICD-10-CM | POA: Diagnosis not present

## 2019-02-02 DIAGNOSIS — U071 COVID-19: Principal | ICD-10-CM | POA: Diagnosis present

## 2019-02-02 DIAGNOSIS — J069 Acute upper respiratory infection, unspecified: Secondary | ICD-10-CM | POA: Diagnosis present

## 2019-02-02 DIAGNOSIS — E1169 Type 2 diabetes mellitus with other specified complication: Secondary | ICD-10-CM | POA: Diagnosis present

## 2019-02-02 DIAGNOSIS — J1289 Other viral pneumonia: Secondary | ICD-10-CM | POA: Diagnosis present

## 2019-02-02 DIAGNOSIS — R000 Tachycardia, unspecified: Secondary | ICD-10-CM | POA: Diagnosis present

## 2019-02-02 DIAGNOSIS — R8271 Bacteriuria: Secondary | ICD-10-CM | POA: Diagnosis not present

## 2019-02-02 DIAGNOSIS — Z7982 Long term (current) use of aspirin: Secondary | ICD-10-CM

## 2019-02-02 DIAGNOSIS — E119 Type 2 diabetes mellitus without complications: Secondary | ICD-10-CM | POA: Diagnosis not present

## 2019-02-02 DIAGNOSIS — Z8249 Family history of ischemic heart disease and other diseases of the circulatory system: Secondary | ICD-10-CM | POA: Diagnosis not present

## 2019-02-02 DIAGNOSIS — Z9089 Acquired absence of other organs: Secondary | ICD-10-CM | POA: Diagnosis not present

## 2019-02-02 DIAGNOSIS — I10 Essential (primary) hypertension: Secondary | ICD-10-CM | POA: Diagnosis present

## 2019-02-02 DIAGNOSIS — R0902 Hypoxemia: Secondary | ICD-10-CM | POA: Diagnosis present

## 2019-02-02 DIAGNOSIS — Z881 Allergy status to other antibiotic agents status: Secondary | ICD-10-CM

## 2019-02-02 DIAGNOSIS — E11649 Type 2 diabetes mellitus with hypoglycemia without coma: Secondary | ICD-10-CM | POA: Diagnosis not present

## 2019-02-02 DIAGNOSIS — E785 Hyperlipidemia, unspecified: Secondary | ICD-10-CM | POA: Diagnosis present

## 2019-02-02 DIAGNOSIS — Z6841 Body Mass Index (BMI) 40.0 and over, adult: Secondary | ICD-10-CM | POA: Diagnosis not present

## 2019-02-02 DIAGNOSIS — Z9981 Dependence on supplemental oxygen: Secondary | ICD-10-CM

## 2019-02-02 DIAGNOSIS — Z20828 Contact with and (suspected) exposure to other viral communicable diseases: Secondary | ICD-10-CM

## 2019-02-02 DIAGNOSIS — Z79899 Other long term (current) drug therapy: Secondary | ICD-10-CM | POA: Diagnosis not present

## 2019-02-02 DIAGNOSIS — Z20822 Contact with and (suspected) exposure to covid-19: Secondary | ICD-10-CM

## 2019-02-02 HISTORY — DX: Acute upper respiratory infection, unspecified: J06.9

## 2019-02-02 HISTORY — DX: COVID-19: U07.1

## 2019-02-02 LAB — COMPREHENSIVE METABOLIC PANEL
ALT: 58 U/L — ABNORMAL HIGH (ref 0–44)
AST: 49 U/L — ABNORMAL HIGH (ref 15–41)
Albumin: 3.4 g/dL — ABNORMAL LOW (ref 3.5–5.0)
Alkaline Phosphatase: 42 U/L (ref 38–126)
Anion gap: 11 (ref 5–15)
BUN: 8 mg/dL (ref 6–20)
CO2: 23 mmol/L (ref 22–32)
Calcium: 8.2 mg/dL — ABNORMAL LOW (ref 8.9–10.3)
Chloride: 103 mmol/L (ref 98–111)
Creatinine, Ser: 0.67 mg/dL (ref 0.44–1.00)
GFR calc Af Amer: >60 mL/min (ref >60)
GFR calc non Af Amer: >60 mL/min (ref >60)
Glucose, Bld: 125 mg/dL — ABNORMAL HIGH (ref 70–99)
Potassium: 3.5 mmol/L (ref 3.5–5.1)
Sodium: 137 mmol/L (ref 135–145)
Total Bilirubin: 0.6 mg/dL (ref 0.3–1.2)
Total Protein: 7.1 g/dL (ref 6.5–8.1)

## 2019-02-02 LAB — CBC WITH DIFFERENTIAL/PLATELET
Abs Immature Granulocytes: 0.05 10*3/uL (ref 0.00–0.07)
Basophils Absolute: 0 10*3/uL (ref 0.0–0.1)
Basophils Relative: 0 %
Eosinophils Absolute: 0 10*3/uL (ref 0.0–0.5)
Eosinophils Relative: 0 %
HCT: 42.9 % (ref 36.0–46.0)
Hemoglobin: 14 g/dL (ref 12.0–15.0)
Immature Granulocytes: 1 %
Lymphocytes Relative: 15 %
Lymphs Abs: 0.8 10*3/uL (ref 0.7–4.0)
MCH: 30 pg (ref 26.0–34.0)
MCHC: 32.6 g/dL (ref 30.0–36.0)
MCV: 91.9 fL (ref 80.0–100.0)
Monocytes Absolute: 0.3 10*3/uL (ref 0.1–1.0)
Monocytes Relative: 6 %
Neutro Abs: 4.1 10*3/uL (ref 1.7–7.7)
Neutrophils Relative %: 78 %
Platelets: 206 10*3/uL (ref 150–400)
RBC: 4.67 MIL/uL (ref 3.87–5.11)
RDW: 12.4 % (ref 11.5–15.5)
WBC: 5.3 10*3/uL (ref 4.0–10.5)
nRBC: 0 % (ref 0.0–0.2)

## 2019-02-02 LAB — HEMOGLOBIN A1C
Hgb A1c MFr Bld: 6.4 % — ABNORMAL HIGH (ref 4.8–5.6)
Mean Plasma Glucose: 136.98 mg/dL

## 2019-02-02 LAB — URINALYSIS, ROUTINE W REFLEX MICROSCOPIC
Bilirubin Urine: NEGATIVE
Glucose, UA: NEGATIVE mg/dL
Hgb urine dipstick: NEGATIVE
Ketones, ur: 20 mg/dL — AB
Leukocytes,Ua: NEGATIVE
Nitrite: POSITIVE — AB
Protein, ur: 30 mg/dL — AB
Specific Gravity, Urine: 1.023 (ref 1.005–1.030)
pH: 5 (ref 5.0–8.0)

## 2019-02-02 LAB — FERRITIN: Ferritin: 392 ng/mL — ABNORMAL HIGH (ref 11–307)

## 2019-02-02 LAB — PROTIME-INR
INR: 1 (ref 0.8–1.2)
Prothrombin Time: 13.1 seconds (ref 11.4–15.2)

## 2019-02-02 LAB — SARS CORONAVIRUS 2 BY RT PCR (HOSPITAL ORDER, PERFORMED IN ~~LOC~~ HOSPITAL LAB): SARS Coronavirus 2: POSITIVE — AB

## 2019-02-02 LAB — LACTIC ACID, PLASMA
Lactic Acid, Venous: 1 mmol/L (ref 0.5–1.9)
Lactic Acid, Venous: 1.5 mmol/L (ref 0.5–1.9)

## 2019-02-02 LAB — GLUCOSE, CAPILLARY: Glucose-Capillary: 254 mg/dL — ABNORMAL HIGH (ref 70–99)

## 2019-02-02 LAB — PROCALCITONIN: Procalcitonin: <0.1 ng/mL

## 2019-02-02 LAB — C-REACTIVE PROTEIN: CRP: 10.9 mg/dL — ABNORMAL HIGH (ref <1.0)

## 2019-02-02 LAB — D-DIMER, QUANTITATIVE: D-Dimer, Quant: 1.06 ug/mL-FEU — ABNORMAL HIGH (ref 0.00–0.50)

## 2019-02-02 LAB — CBG MONITORING, ED: Glucose-Capillary: 115 mg/dL — ABNORMAL HIGH (ref 70–99)

## 2019-02-02 LAB — FIBRINOGEN: Fibrinogen: 639 mg/dL — ABNORMAL HIGH (ref 210–475)

## 2019-02-02 LAB — APTT: aPTT: 30 seconds (ref 24–36)

## 2019-02-02 LAB — SEDIMENTATION RATE: Sed Rate: 34 mm/hr — ABNORMAL HIGH (ref 0–22)

## 2019-02-02 LAB — ABO/RH: ABO/RH(D): B NEG

## 2019-02-02 LAB — LACTATE DEHYDROGENASE: LDH: 259 U/L — ABNORMAL HIGH (ref 98–192)

## 2019-02-02 MED ORDER — ATORVASTATIN CALCIUM 10 MG PO TABS
20.0000 mg | ORAL_TABLET | Freq: Every day | ORAL | Status: DC
  Administered 2019-02-02 – 2019-02-09 (×8): 20 mg via ORAL
  Filled 2019-02-02 (×8): qty 2

## 2019-02-02 MED ORDER — ASPIRIN EC 81 MG PO TBEC
81.0000 mg | DELAYED_RELEASE_TABLET | Freq: Every day | ORAL | Status: DC
  Administered 2019-02-02 – 2019-02-09 (×8): 81 mg via ORAL
  Filled 2019-02-02 (×8): qty 1

## 2019-02-02 MED ORDER — SODIUM CHLORIDE 0.9 % IV SOLN
200.0000 mg | Freq: Once | INTRAVENOUS | Status: AC
  Administered 2019-02-02: 200 mg via INTRAVENOUS
  Filled 2019-02-02: qty 40

## 2019-02-02 MED ORDER — ONDANSETRON HCL 4 MG PO TABS: 4.0000 mg | ORAL_TABLET | Freq: Four times a day (QID) | ORAL | Status: DC | PRN

## 2019-02-02 MED ORDER — GUAIFENESIN-DM 100-10 MG/5ML PO SYRP
5.0000 mL | ORAL_SOLUTION | ORAL | Status: DC | PRN
  Administered 2019-02-02 – 2019-02-08 (×3): 5 mL via ORAL
  Filled 2019-02-02 (×3): qty 10

## 2019-02-02 MED ORDER — ACETAMINOPHEN 325 MG PO TABS
650.0000 mg | ORAL_TABLET | Freq: Four times a day (QID) | ORAL | Status: DC | PRN
  Filled 2019-02-02: qty 2

## 2019-02-02 MED ORDER — DEXAMETHASONE SODIUM PHOSPHATE 10 MG/ML IJ SOLN
6.0000 mg | Freq: Once | INTRAMUSCULAR | Status: AC
  Administered 2019-02-02: 14:00:00 6 mg via INTRAVENOUS
  Filled 2019-02-02: qty 1

## 2019-02-02 MED ORDER — FAMOTIDINE 20 MG PO TABS
20.0000 mg | ORAL_TABLET | Freq: Every day | ORAL | Status: DC
  Administered 2019-02-02 – 2019-02-09 (×8): 20 mg via ORAL
  Filled 2019-02-02 (×8): qty 1

## 2019-02-02 MED ORDER — DEXAMETHASONE SODIUM PHOSPHATE 10 MG/ML IJ SOLN: 6.0000 mg | INTRAMUSCULAR | Status: DC

## 2019-02-02 MED ORDER — SODIUM CHLORIDE 0.9 % IV SOLN
1000.0000 mL | INTRAVENOUS | Status: DC
  Administered 2019-02-02: 12:00:00 1000 mL via INTRAVENOUS

## 2019-02-02 MED ORDER — SODIUM CHLORIDE 0.9 % IV BOLUS
500.0000 mL | Freq: Once | INTRAVENOUS | Status: AC
  Administered 2019-02-02: 14:00:00 500 mL via INTRAVENOUS

## 2019-02-02 MED ORDER — INSULIN ASPART 100 UNIT/ML ~~LOC~~ SOLN
0.0000 [IU] | Freq: Three times a day (TID) | SUBCUTANEOUS | Status: DC
  Administered 2019-02-03: 2 [IU] via SUBCUTANEOUS
  Administered 2019-02-03: 3 [IU] via SUBCUTANEOUS
  Administered 2019-02-03 – 2019-02-05 (×5): 2 [IU] via SUBCUTANEOUS
  Administered 2019-02-05 (×2): 3 [IU] via SUBCUTANEOUS
  Administered 2019-02-06 (×3): 2 [IU] via SUBCUTANEOUS
  Administered 2019-02-07 – 2019-02-09 (×7): 3 [IU] via SUBCUTANEOUS

## 2019-02-02 MED ORDER — ONDANSETRON HCL 4 MG/2ML IJ SOLN: 4.0000 mg | Freq: Four times a day (QID) | INTRAMUSCULAR | Status: DC | PRN

## 2019-02-02 MED ORDER — SALINE SPRAY 0.65 % NA SOLN
1.0000 | NASAL | Status: DC | PRN
  Administered 2019-02-06: 1 via NASAL
  Filled 2019-02-02: qty 44

## 2019-02-02 MED ORDER — INSULIN ASPART 100 UNIT/ML ~~LOC~~ SOLN
0.0000 [IU] | Freq: Every day | SUBCUTANEOUS | Status: DC
  Administered 2019-02-02: 22:00:00 3 [IU] via SUBCUTANEOUS
  Administered 2019-02-08: 21:00:00 2 [IU] via SUBCUTANEOUS

## 2019-02-02 MED ORDER — ENOXAPARIN SODIUM 40 MG/0.4ML ~~LOC~~ SOLN
40.0000 mg | SUBCUTANEOUS | Status: DC
  Administered 2019-02-02 – 2019-02-03 (×2): 40 mg via SUBCUTANEOUS
  Filled 2019-02-02 (×2): qty 0.4

## 2019-02-02 MED ORDER — SODIUM CHLORIDE 0.9 % IV SOLN
100.0000 mg | INTRAVENOUS | Status: AC
  Administered 2019-02-03 – 2019-02-06 (×4): 100 mg via INTRAVENOUS
  Filled 2019-02-02 (×4): qty 20

## 2019-02-02 NOTE — ED Provider Notes (Signed)
[facility] EMERGENCY DEPARTMENT Provider Note   CSN: [id number] Arrival date & time: 02/02/19  1114     History   Chief Complaint Chief Complaint  Patient presents with   Fever    HPI Nicole Santos is a 48 y.o. female.      Fever   Pt was seen at 1245. Per pt, c/o gradual onset and worsening of persistent multiple symptoms for the past 11 days. Symptoms include: global headache, cough, generalized body aches/fatigue, fevers to "102," and decreased PO intake. Pt states she also feels she "can't get to the bathroom fast enough" to urinate and has urinated on herself.  Pt states her daughter came to her home the week before her symptoms began. Pt states her daughter did not have symptoms at that time. Pt states she and her daughter developed symptoms the week after visiting each other, pt's daughter went to work ([employer]) and tested +COVID. Pt states she "didn't bother" to get tested, assuming she too had COVID since she and her daughter had the same symptoms. Pt states her symptoms have been gradually worsening. She continues to have daily fevers to "102," LD NSAID this morning. Denies CP/palpitations, no abd pain, no N/V/D, no neck or back pain, no rash.    Past Medical History:  Diagnosis Date   Cancer [facility])    Hypertension     Patient Active Problem List   Diagnosis Date Noted   Weight gain 08/24/2018   Diabetes mellitus without complication ([place]) [id number]   Hyperlipidemia associated with type 2 diabetes mellitus ([place]) 10/03/2016   Dyspnea on exertion 08/01/2016   Pain of right breast 08/01/2016   Sciatica of left side 07/04/2016   Essential hypertension 07/04/2016   Cervical cancer ([place]) 10/20/2013    Past Surgical History:  Procedure Laterality Date   KNEE SURGERY Right    Arthroscopic   TONSILLECTOMY AND ADENOIDECTOMY       OB History   No obstetric history on file.      Home Medications    Prior to Admission medications   Medication Sig Start  Date End Date Taking? Authorizing Provider  aspirin EC 81 MG tablet Take 81 mg by mouth daily.    [provider]  atorvastatin (LIPITOR) 20 MG tablet Take 1 tablet (20 mg total) by mouth daily. 08/22/18   Terald Sleeper, PA-C  cimetidine (TAGAMET) 200 MG tablet Take 200 mg by mouth 2 (two) times daily.    [provider]  lisinopril (PRINIVIL,ZESTRIL) 10 MG tablet TAKE 1 TABLET BY MOUTH DAILY (NEEDS TO BE SEEN) 08/22/18   Terald Sleeper, PA-C  metFORMIN (GLUCOPHAGE) 500 MG tablet Take 1 tablet (500 mg total) by mouth daily. 08/22/18   Terald Sleeper, PA-C    Family History Family History  Problem Relation Age of Onset   Hypertension Mother     Social History Social History   Tobacco Use   Smoking status: Never Smoker   Smokeless tobacco: Never Used  Substance Use Topics   Alcohol use: No   Drug use: No     Allergies   Augmentin [amoxicillin-pot clavulanate] and Doxycycline   Review of Systems Review of Systems  Constitutional: Positive for fever.   ROS: Statement: All systems negative except as marked or noted in the HPI; Constitutional: +fever and chills, generalized fatigue/body aches. ; ; Eyes: Negative for eye pain, redness and discharge. ; ; ENMT: Negative for ear pain, hoarseness, nasal congestion, sinus pressure and sore throat. ; ;  Cardiovascular: Negative for chest pain, palpitations, diaphoresis, dyspnea and peripheral edema. ; ; Respiratory: +cough. Negative for wheezing and stridor. ; ; Gastrointestinal: Negative for nausea, vomiting, diarrhea, abdominal pain, blood in stool, hematemesis, jaundice and rectal bleeding. . ; ; Genitourinary: Negative for dysuria, flank pain and hematuria. ; ; Musculoskeletal: Negative for back pain and neck pain. Negative for swelling and trauma.; ; Skin: Negative for pruritus, rash, abrasions, blisters, bruising and skin lesion.; ; Neuro: Negative for headache, lightheadedness and neck stiffness. Negative for altered  level of consciousness, altered mental status, extremity weakness, paresthesias, involuntary movement, seizure and syncope.       Physical Exam Updated Vital Signs BP 126/76    Pulse (!) 110    Temp 99.9 F (37.7 C) (Oral)    Resp (!) 21    Ht 5\' 2"  (1.575 m)    Wt 120.2 kg    SpO2 94%    BMI 48.47 kg/m    Patient Vitals for the past 24 hrs:  BP Temp Temp src Pulse Resp SpO2 Height Weight  02/02/19 1300 126/76 -- -- (!) 110 (!) 21 94 % -- --  02/02/19 1230 113/73 -- -- (!) 110 (!) 23 94 % -- --  02/02/19 1200 (!) 132/96 -- -- (!) 115 (!) 25 92 % -- --  02/02/19 1145 (!) 134/92 99.9 F (37.7 C) Oral (!) 121 (!) 28 (!) 75 % -- --  02/02/19 1142 -- -- -- -- -- -- 5\' 2"  (1.575 m) 120.2 kg     Physical Exam 1250: Physical examination:  Nursing notes reviewed; Vital signs and O2 SAT reviewed;  Constitutional: Well developed, Well nourished, In no acute distress. Appears fatigued.; Head:  Normocephalic, atraumatic; Eyes: EOMI, PERRL, No scleral icterus; ENMT: Mouth and pharynx normal, Mucous membranes dry; Neck: Supple, no meningeal signs. Full range of motion, No lymphadenopathy; Cardiovascular: Tachycardic rate and rhythm, No gallop; Respiratory: Breath sounds coarse & equal bilaterally, No wheezes.  Speaking full sentences with ease, Normal respiratory effort/excursion; Chest: Nontender, Movement normal; Abdomen: Soft, Nontender, Nondistended, Normal bowel sounds; Genitourinary: No CVA tenderness; Extremities: Peripheral pulses normal, No tenderness, No edema, No calf edema or asymmetry.; Neuro: AA&Ox3, Major CN grossly intact. No facial droop. Speech clear. No gross focal motor or sensory deficits in extremities.; Skin: Color normal, Warm, Dry.     ED Treatments / Results  Labs (all labs ordered are listed, but only abnormal results are displayed)   EKG EKG Interpretation  Date/Time:  Monday February 02 2019 11:49:30 EDT Ventricular Rate:  116 PR Interval:    QRS Duration: 94 QT  Interval:  318 QTC Calculation: 442 R Axis:   106 Text Interpretation:  Sinus tachycardia Right axis deviation Low voltage, precordial leads Borderline T abnormalities, anterior leads Baseline wander No old tracing to compare Confirmed by Francine Graven [phone]) on 02/02/2019 12:34:45 PM   Radiology   Procedures Procedures (including critical care time)  Medications Ordered in ED Medications  0.9 %  sodium chloride infusion (1,000 mLs Intravenous New Bag/Given 02/02/19 1208)  sodium chloride 0.9 % bolus 500 mL (has no administration in time range)     Initial Impression / Assessment and Plan / ED Course  I have reviewed the triage vital signs and the nursing notes.  Pertinent labs & imaging results that were available during my care of the patient were reviewed by me and considered in my medical decision making (see chart for details).     MDM Reviewed: previous chart, nursing note  and vitals Reviewed previous: labs and ECG Interpretation: labs, ECG and x-ray     Results for orders placed or performed during the hospital encounter of 02/02/19  Blood Culture (routine x 2)   Specimen: BLOOD  Result Value Ref Range   Specimen Description BLOOD LEFT ANTECUBITAL    Special Requests      BOTTLES DRAWN AEROBIC AND ANAEROBIC Blood Culture adequate volume Performed at [facility], [address], [place], [place] [address]    Culture PENDING    Report Status PENDING   Blood Culture (routine x 2)   Specimen: BLOOD  Result Value Ref Range   Specimen Description BLOOD RIGHT ANTECUBITAL    Special Requests      BOTTLES DRAWN AEROBIC AND ANAEROBIC Blood Culture adequate volume Performed at [facility], [address], [place], [place] [address]    Culture PENDING    Report Status PENDING   SARS Coronavirus 2 [facility] order, Performed in [place] hospital lab) Nasopharyngeal Nasopharyngeal Swab   Specimen: Nasopharyngeal Swab  Result Value Ref Range   SARS Coronavirus 2  POSITIVE (A) NEGATIVE  Lactic acid, plasma  Result Value Ref Range   Lactic Acid, Venous 1.0 0.5 - 1.9 mmol/L  Comprehensive metabolic panel  Result Value Ref Range   Sodium 137 135 - 145 mmol/L   Potassium 3.5 3.5 - 5.1 mmol/L   Chloride 103 98 - 111 mmol/L   CO2 23 22 - 32 mmol/L   Glucose, Bld 125 (H) 70 - 99 mg/dL   BUN 8 6 - 20 mg/dL   Creatinine, Ser 0.67 0.44 - 1.00 mg/dL   Calcium 8.2 (L) 8.9 - 10.3 mg/dL   Total Protein 7.1 6.5 - 8.1 g/dL   Albumin 3.4 (L) 3.5 - 5.0 g/dL   AST 49 (H) 15 - 41 U/L   ALT 58 (H) 0 - 44 U/L   Alkaline Phosphatase 42 38 - 126 U/L   Total Bilirubin 0.6 0.3 - 1.2 mg/dL   GFR calc non Af Amer >60 >60 mL/min   GFR calc Af Amer >60 >60 mL/min   Anion gap 11 5 - 15  CBC WITH DIFFERENTIAL  Result Value Ref Range   WBC 5.3 4.0 - 10.5 K/uL   RBC 4.67 3.87 - 5.11 MIL/uL   Hemoglobin 14.0 12.0 - 15.0 g/dL   HCT 42.9 36.0 - 46.0 %   MCV 91.9 80.0 - 100.0 fL   MCH 30.0 26.0 - 34.0 pg   MCHC 32.6 30.0 - 36.0 g/dL   RDW 12.4 11.5 - 15.5 %   Platelets 206 150 - 400 K/uL   nRBC 0.0 0.0 - 0.2 %   Neutrophils Relative % 78 %   Neutro Abs 4.1 1.7 - 7.7 K/uL   Lymphocytes Relative 15 %   Lymphs Abs 0.8 0.7 - 4.0 K/uL   Monocytes Relative 6 %   Monocytes Absolute 0.3 0.1 - 1.0 K/uL   Eosinophils Relative 0 %   Eosinophils Absolute 0.0 0.0 - 0.5 K/uL   Basophils Relative 0 %   Basophils Absolute 0.0 0.0 - 0.1 K/uL   Immature Granulocytes 1 %   Abs Immature Granulocytes 0.05 0.00 - 0.07 K/uL  APTT  Result Value Ref Range   aPTT 30 24 - 36 seconds  Protime-INR  Result Value Ref Range   Prothrombin Time 13.1 11.4 - 15.2 seconds   INR 1.0 0.8 - 1.2   Dg Chest Port 1 View Result Date: 02/02/2019 CLINICAL DATA:  Shortness  of breath, fever EXAM: PORTABLE CHEST 1 VIEW COMPARISON:  None. FINDINGS: Cardiomegaly. Lung volumes are low. Multifocal bilateral airspace consolidations including dense airspace opacity within the right upper lobe. No large pleural  fluid collection. No pneumothorax. IMPRESSION: Multifocal bilateral airspace consolidations suspicious for atypical pneumonia. Electronically Signed   By: Davina Poke M.D.   On: 02/02/2019 12:27   Results for Nicole Santos, Nicole Santos (MRN [id number]) as of 02/02/2019 13:37  Ref. Range 09/10/2016 09:25 01/07/2017 09:19 07/10/2017 08:40 08/22/2018 12:01 02/02/2019 12:00  AST Latest Ref Range: 15 - 41 U/L 22 19 15 12  49 (H)  ALT Latest Ref Range: 0 - 44 U/L 26 29 22 20  58 (H)     Nicole Santos was evaluated in Emergency Department on 02/02/2019 for the symptoms described in the history of present illness. She was evaluated in the context of the global COVID-19 pandemic, which necessitated consideration that the patient might be at risk for infection with the SARS-CoV-2 virus that causes COVID-19. Institutional protocols and algorithms that pertain to the evaluation of patients at risk for COVID-19 are in a state of rapid change based on information released by regulatory bodies including the CDC and federal and state organizations. These policies and algorithms were followed during the patient's care in the ED.    1335:   No fever while in the ED; LD NSAID this morning. Pt's O2 Sats 75% R/A on arrival, increased to 94% on O2 4L N/C. Dx and testing d/w pt.  Questions answered.  Verb understanding, agreeable to admit. T/C returned from [facility] Carles Collet, case discussed, including:  HPI, pertinent PM/SHx, VS/PE, dx testing, ED course and treatment:  Agreeable to admit/transfer to [facility].      Final Clinical Impressions(s) / ED Diagnoses   Final diagnoses:  None    ED Discharge Orders    None       Francine Graven, DO 02/05/19 1538

## 2019-02-02 NOTE — ED Triage Notes (Addendum)
Fever, body aches and weakness x 11 days.  Daughter works at nursing home and is positive for covid

## 2019-02-02 NOTE — H&P (Addendum)
History and Physical  Nicole Santos [id number] DOB: 12/31/1970 DOA: 02/02/2019   PCP: Terald Sleeper, PA-C   Patient coming from: Home  Chief Complaint: sob, myalgia, arthralgia  HPI:  Nicole Santos is a 48 y.o. female with medical history of diabetes mellitus type 2, hyperlipidemia, hypertension presenting with 11-day history of fevers, chills, myalgias, headache, and nonproductive cough.  The patient's daughter currently works at a nursing facility in [facility], and she was recently diagnosed with [place] on 01/19/2019.  Patient states that she went out to have lunch with her son on 01/17/2019, and she also visited her daughter around that time frame. She stated that her daughter was feeling fine at that time.  However, a couple days later, her daughter exhibited symptoms consistent with COVID.  Pt believes her daughter was tested for COVID on 01/19/19.  Initially, the patient felt fine and isolated herself.  However for the last 11 days the patient has been complaining of continued myalgias, arthralgias, and headache.  In the past 1 to 2 days she has had worsening cough, shortness of breath, and headache.  As result, she presented for further evaluation.  She denies any nausea, vomiting, diarrhea, abdominal pain, dysuria, hematuria, hematochezia, melena. In the emergency department, the patient had a temperature of 99.9 F with tachycardia up to 121.  Oxygen saturation initially was 75% on room air.  She was placed on 5 L nasal cannula with oxygen saturation up to 92-93%.  BMP was unremarkable.  AST 49, ALT 58, alkaline phosphatase 42, total bilirubin 0.6.  WBC was 5.3 with hemoglobin 14.0 and platelets 206,000.  Chest x-ray showed bilateral multifocal infiltrates.  Lactic acid was 1.0.  SARS-CoV2 was positive.  The patient was started on dexamethasone and remdesivir.  The patient will be transferred to [facility] for further care.  Assessment/Plan: Acute respiratory failure with hypoxia  secondary to COVID-19 disease -Start dexamethasone -Start remdesivir -Currently stable on 5 L nasal cannula -CRP -Ferritin -Fibrinogen -LDH -D-dimer -Procalcitonin -Blood cultures x2 sets  Diabetes mellitus type 2 -Holding metformin -07/10/2017 hemoglobin A1c 6.0 -Repeat hemoglobin A1c -NovoLog sliding scale -Anticipate elevated CBGs secondary to steroids  Essential hypertension -Holding lisinopril in the setting of soft blood pressure  Hyperlipidemia -Continue statin  Sinus tachycardia -Personally reviewed EKG--sinus tachycardia, nonspecific T wave changes       Past Medical History:  Diagnosis Date  . Cancer ([place])   . Hypertension    Past Surgical History:  Procedure Laterality Date  . KNEE SURGERY Right    Arthroscopic  . TONSILLECTOMY AND ADENOIDECTOMY     Social History:  reports that she has never smoked. She has never used smokeless tobacco. She reports that she does not drink alcohol or use drugs.   Family History  Problem Relation Age of Onset  . Hypertension Mother      Allergies  Allergen Reactions  . Augmentin [Amoxicillin-Pot Clavulanate] Diarrhea and Nausea And Vomiting  . Doxycycline     Abdominal cramps.     Prior to Admission medications   Medication Sig Start Date End Date Taking? Authorizing Provider  aspirin EC 81 MG tablet Take 81 mg by mouth daily.    [provider]  atorvastatin (LIPITOR) 20 MG tablet Take 1 tablet (20 mg total) by mouth daily. 08/22/18   Terald Sleeper, PA-C  cimetidine (TAGAMET) 200 MG tablet Take 200 mg by mouth 2 (two) times daily.    [provider]  lisinopril (  PRINIVIL,ZESTRIL) 10 MG tablet TAKE 1 TABLET BY MOUTH DAILY (NEEDS TO BE SEEN) 08/22/18   Terald Sleeper, PA-C  metFORMIN (GLUCOPHAGE) 500 MG tablet Take 1 tablet (500 mg total) by mouth daily. 08/22/18   Terald Sleeper, PA-C    Review of Systems:  Constitutional:  No weight loss, night sweats, Head&Eyes: No headache.  No  vision loss.  No eye pain or scotoma ENT:  No Difficulty swallowing,Tooth/dental problems,Sore throat,  No ear ache, post nasal drip,  Cardio-vascular:  No chest pain, Orthopnea, PND, swelling in lower extremities,  dizziness, palpitations  GI:  No  abdominal pain, nausea, vomiting, diarrhea,  hematochezia, melena, heartburn, indigestion, Resp:  No coughing up of blood .No wheezing.No chest wall deformity  Skin:  no rash or lesions.  GU:  no dysuria, change in color of urine, no urgency or frequency. No flank pain.  Musculoskeletal:   No decreased range of motion. No back pain.  Psych:  No change in mood or affect. No depression or anxiety. Neurologic: No headache, no dysesthesia, no focal weakness, no vision loss. No syncope  Physical Exam: Vitals:   02/02/19 1300 02/02/19 1330 02/02/19 1413 02/02/19 1415  BP: 126/76 119/71 (!) 151/89   Pulse: (!) 110 (!) 111 (!) 110   Resp: (!) 21 (!) 27 (!) 30   Temp:    100 F (37.8 C)  TempSrc:    Oral  SpO2: 94% 94% 94%   Weight:      Height:       General:  A&O x 3, NAD, nontoxic, pleasant/cooperative Head/Eye: No conjunctival hemorrhage, no icterus, [place]/AT, No nystagmus ENT:  No icterus,  No thrush, good dentition, no pharyngeal exudate Neck:  No masses, no lymphadenpathy, no bruits CV:  RRR, no rub, no gallop, no S3 Lung:  Bibasilar rales, no wheeze Abdomen: soft/NT, +BS, nondistended, no peritoneal signs Ext: No cyanosis, No rashes, No petechiae, No lymphangitis, No edema Neuro: CNII-XII intact, strength 4/5 in bilateral upper and lower extremities, no dysmetria  Labs on Admission:  Basic Metabolic Panel: Recent Labs  Lab 02/02/19 1200  NA 137  K 3.5  CL 103  CO2 23  GLUCOSE 125*  BUN 8  CREATININE 0.67  CALCIUM 8.2*   Liver Function Tests: Recent Labs  Lab 02/02/19 1200  AST 49*  ALT 58*  ALKPHOS 42  BILITOT 0.6  PROT 7.1  ALBUMIN 3.4*   No results for input(s): LIPASE, AMYLASE in the last 168 hours. No  results for input(s): AMMONIA in the last 168 hours. CBC: Recent Labs  Lab 02/02/19 1200  WBC 5.3  NEUTROABS 4.1  HGB 14.0  HCT 42.9  MCV 91.9  PLT 206   Coagulation Profile: Recent Labs  Lab 02/02/19 1200  INR 1.0   Cardiac Enzymes: No results for input(s): CKTOTAL, CKMB, CKMBINDEX, TROPONINI in the last 168 hours. BNP: Invalid input(s): POCBNP CBG: No results for input(s): GLUCAP in the last 168 hours. Urine analysis:    Component Value Date/Time   APPEARANCEUR Clear 03/12/2018 1802   GLUCOSEU Negative 03/12/2018 1802   BILIRUBINUR Negative 03/12/2018 1802   PROTEINUR Trace (A) 03/12/2018 1802   NITRITE Positive (A) 03/12/2018 1802   LEUKOCYTESUR 1+ (A) 03/12/2018 1802   Sepsis Labs: @LABRCNTIP (procalcitonin:4,lacticidven:4) ) Recent Results (from the past 240 hour(s))  SARS Coronavirus 2 [facility] order, Performed in [facility] hospital lab) Nasopharyngeal Nasopharyngeal Swab     Status: Abnormal   Collection Time: 02/02/19 12:01 PM   Specimen: Nasopharyngeal Swab  Result Value Ref Range Status   SARS Coronavirus 2 POSITIVE (A) NEGATIVE Final    Comment: CALLED TO HEATHER CRAWFORD AT 1322 BY HFLYNT 02/02/19 (NOTE) If result is NEGATIVE SARS-CoV-2 target nucleic acids are NOT DETECTED. The SARS-CoV-2 RNA is generally detectable in upper and lower  respiratory specimens during the acute phase of infection. The lowest  concentration of SARS-CoV-2 viral copies this assay can detect is 250  copies / mL. A negative result does not preclude SARS-CoV-2 infection  and should not be used as the sole basis for treatment or other  patient management decisions.  A negative result may occur with  improper specimen collection / handling, submission of specimen other  than nasopharyngeal swab, presence of viral mutation(s) within the  areas targeted by this assay, and inadequate number of viral copies  (<250 copies / mL). A negative result must be combined with clinical   observations, patient history, and epidemiological information. If result is POSITIVE SARS-CoV-2 target nucleic acids are DETECTED. The SARS-CoV-2 RNA is generally de tectable in upper and lower  respiratory specimens during the acute phase of infection.  Positive  results are indicative of active infection with SARS-CoV-2.  Clinical  correlation with patient history and other diagnostic information is  necessary to determine patient infection status.  Positive results do  not rule out bacterial infection or co-infection with other viruses. If result is PRESUMPTIVE POSTIVE SARS-CoV-2 nucleic acids MAY BE PRESENT.   A presumptive positive result was obtained on the submitted specimen  and confirmed on repeat testing.  While 2019 novel coronavirus  (SARS-CoV-2) nucleic acids may be present in the submitted sample  additional confirmatory testing may be necessary for epidemiological  and / or clinical management purposes  to differentiate between  SARS-CoV-2 and other Sarbecovirus currently known to infect humans.  If clinically indicated additional testing with an alternate test  methodology [phone]) is advised. The SARS-CoV-2 RNA is g enerally  detectable in upper and lower respiratory specimens during the acute  phase of infection. The expected result is Negative. Fact Sheet for Patients:  StrictlyIdeas.no Fact Sheet for Healthcare Providers: BankingDealers.co.za This test is not yet approved or cleared by the [place] FDA and has been authorized for detection and/or diagnosis of SARS-CoV-2 by FDA under an Emergency Use Authorization (EUA).  This EUA will remain in effect (meaning this test can be used) for the duration of the COVID-19 declaration under Section 564(b)(1) of the Act, 21 U.S.C. section 360bbb-3(b)(1), unless the authorization is terminated or revoked sooner. Performed at [facility], [address]  [place], [place] [address]   Blood Culture (routine x 2)     Status: None (Preliminary result)   Collection Time: 02/02/19 12:05 PM   Specimen: BLOOD  Result Value Ref Range Status   Specimen Description BLOOD RIGHT ANTECUBITAL  Final   Special Requests   Final    BOTTLES DRAWN AEROBIC AND ANAEROBIC Blood Culture adequate volume Performed at [facility], [address] [place], [place] [address]    Culture PENDING  Incomplete   Report Status PENDING  Incomplete  Blood Culture (routine x 2)     Status: None (Preliminary result)   Collection Time: 02/02/19 12:55 PM   Specimen: BLOOD  Result Value Ref Range Status   Specimen Description BLOOD LEFT ANTECUBITAL  Final   Special Requests   Final    BOTTLES DRAWN AEROBIC AND ANAEROBIC Blood Culture adequate volume Performed at [facility], [address] [place], [place]  [address]    Culture PENDING  Incomplete   Report Status PENDING  Incomplete     Radiological Exams on Admission: Dg Chest Port 1 View  Result Date: 02/02/2019 CLINICAL DATA:  Shortness of breath, fever EXAM: PORTABLE CHEST 1 VIEW COMPARISON:  None. FINDINGS: Cardiomegaly. Lung volumes are low. Multifocal bilateral airspace consolidations including dense airspace opacity within the right upper lobe. No large pleural fluid collection. No pneumothorax. IMPRESSION: Multifocal bilateral airspace consolidations suspicious for atypical pneumonia. Electronically Signed   By: Davina Poke M.D.   On: 02/02/2019 12:27    EKG: Independently reviewed. Sinus, nonspecific T wave changes    Time spent:60 minutes Code Status:   FULL Family Communication:  Son updated on phone 8/31 Disposition Plan: Transfer to [place] called: none  DVT Prophylaxis: [place] Lovenox  Orson Eva, DO  [facility] Pager [phone]  If 7PM-7AM, please contact night-coverage [email or web address] Password [email or web address] 02/02/2019, 2:15 PM

## 2019-02-02 NOTE — Progress Notes (Addendum)
Patient on 12L NRB when this RN took over at 1900. Patient satting mid 90s and resting comfortably in bed. Does become tachypneic to low 30s and SOB with minimal activity to [facility].   Chart reviewed and pharmacy called regarding remdesivir not being ordered. Pharmacist placed ordered and first dose administered.   Transitioned patient to 10L HFNC. Satting 92%. Gave flutter valve and IS, achieved 500 cc X5. Educated on importance of prone positioning, pt agreeable to sleep on stomach.

## 2019-02-02 NOTE — ED Notes (Signed)
Date and time results received: 02/02/19 1321 (use smartphrase ".now" to insert current time)  Test: Covid Critical Value: Positive Name of Provider Notified:Dr Thurnell Garbe  Orders Received? Or Actions Taken?: NA

## 2019-02-02 NOTE — ED Notes (Signed)
Report given to [facility]. 

## 2019-02-02 NOTE — ED Notes (Signed)
Pt's O2 sats decreased to 79% when using the bedside commode, while on 4L O2. Have put pt on Non rebreather. O2 sats 93%

## 2019-02-02 NOTE — Progress Notes (Signed)
Telephone visit  Subjective: CC: URI PCP: Terald Sleeper, PA-C HPI:Shyteria CHANTERIA LOERA is a 48 y.o. female calls for telephone consult today. Patient provides verbal consent for consult held via phone.  Location of patient: home Location of provider: Working remotely from home Others present for call: none  1. ?COVID Patient reports that her daughter works at the nursing home in [place], she was positive for [place].  She reports onset of symptoms 11 days.  She reports fever (Tmax 102F last night), myalgia, headache, cough (worsening), difficulty taking a deep breath in.  She is losing control of her bladder.  She report fatigue.     ROS: Per HPI  Allergies  Allergen Reactions  . Augmentin [Amoxicillin-Pot Clavulanate] Diarrhea and Nausea And Vomiting  . Doxycycline     Abdominal cramps.   Past Medical History:  Diagnosis Date  . Cancer ([place])   . Hypertension     Current Outpatient Medications:  .  aspirin EC 81 MG tablet, Take 81 mg by mouth daily., Disp: , Rfl:  .  atorvastatin (LIPITOR) 20 MG tablet, Take 1 tablet (20 mg total) by mouth daily., Disp: 90 tablet, Rfl: 3 .  cimetidine (TAGAMET) 200 MG tablet, Take 200 mg by mouth 2 (two) times daily., Disp: , Rfl:  .  lisinopril (PRINIVIL,ZESTRIL) 10 MG tablet, TAKE 1 TABLET BY MOUTH DAILY (NEEDS TO BE SEEN), Disp: 90 tablet, Rfl: 3 .  metFORMIN (GLUCOPHAGE) 500 MG tablet, Take 1 tablet (500 mg total) by mouth daily., Disp: 90 tablet, Rfl: 3  Assessment/ Plan: 48 y.o. female   1. Suspected Covid-19 Virus Infection I strongly suspect COVID-19 infection in this patient.  I worry about pneumonia given ongoing cough and difficulty with deep breathing.  I also worry about acute kidney injury given difficulty with fluids and use of ACE inhibitor.  I have sent her to the emergency department any pain hospital and we will contact the charge nurse to let them know of her arrival.  She is being transported by her son.  2. Close Exposure to  Covid-19 Virus Known close exposure to COVID-19.  Her daughter works at a [employer] home and was recently diagnosed with COVID-19.   Start time: 10:00am End time: 10:06am  Total time spent on patient care (including telephone call/ virtual visit): 10 minutes  [place], [place] [phone]

## 2019-02-03 DIAGNOSIS — J069 Acute upper respiratory infection, unspecified: Secondary | ICD-10-CM

## 2019-02-03 DIAGNOSIS — J9601 Acute respiratory failure with hypoxia: Secondary | ICD-10-CM

## 2019-02-03 DIAGNOSIS — U071 COVID-19: Principal | ICD-10-CM

## 2019-02-03 DIAGNOSIS — E119 Type 2 diabetes mellitus without complications: Secondary | ICD-10-CM

## 2019-02-03 DIAGNOSIS — I10 Essential (primary) hypertension: Secondary | ICD-10-CM

## 2019-02-03 LAB — MAGNESIUM: Magnesium: 2.2 mg/dL (ref 1.7–2.4)

## 2019-02-03 LAB — CBC WITH DIFFERENTIAL/PLATELET
Abs Immature Granulocytes: 0.04 10*3/uL (ref 0.00–0.07)
Basophils Absolute: 0 10*3/uL (ref 0.0–0.1)
Basophils Relative: 0 %
Eosinophils Absolute: 0 10*3/uL (ref 0.0–0.5)
Eosinophils Relative: 0 %
HCT: 39.5 % (ref 36.0–46.0)
Hemoglobin: 12.9 g/dL (ref 12.0–15.0)
Immature Granulocytes: 1 %
Lymphocytes Relative: 13 %
Lymphs Abs: 0.6 10*3/uL — ABNORMAL LOW (ref 0.7–4.0)
MCH: 29.7 pg (ref 26.0–34.0)
MCHC: 32.7 g/dL (ref 30.0–36.0)
MCV: 91 fL (ref 80.0–100.0)
Monocytes Absolute: 0.2 10*3/uL (ref 0.1–1.0)
Monocytes Relative: 5 %
Neutro Abs: 3.9 10*3/uL (ref 1.7–7.7)
Neutrophils Relative %: 81 %
Platelets: 217 10*3/uL (ref 150–400)
RBC: 4.34 MIL/uL (ref 3.87–5.11)
RDW: 12.3 % (ref 11.5–15.5)
WBC: 4.7 10*3/uL (ref 4.0–10.5)
nRBC: 0 % (ref 0.0–0.2)

## 2019-02-03 LAB — GLUCOSE, CAPILLARY
Glucose-Capillary: 156 mg/dL — ABNORMAL HIGH (ref 70–99)
Glucose-Capillary: 133 mg/dL — ABNORMAL HIGH (ref 70–99)
Glucose-Capillary: 123 mg/dL — ABNORMAL HIGH (ref 70–99)

## 2019-02-03 LAB — HIV ANTIBODY (ROUTINE TESTING W REFLEX): HIV Screen 4th Generation wRfx: NONREACTIVE

## 2019-02-03 LAB — FERRITIN: Ferritin: 410 ng/mL — ABNORMAL HIGH (ref 11–307)

## 2019-02-03 LAB — C-REACTIVE PROTEIN: CRP: 13.4 mg/dL — ABNORMAL HIGH (ref <1.0)

## 2019-02-03 LAB — D-DIMER, QUANTITATIVE: D-Dimer, Quant: 0.81 ug/mL-FEU — ABNORMAL HIGH (ref 0.00–0.50)

## 2019-02-03 MED ORDER — INSULIN DETEMIR 100 UNIT/ML ~~LOC~~ SOLN
25.0000 [IU] | Freq: Two times a day (BID) | SUBCUTANEOUS | Status: DC
  Administered 2019-02-03 – 2019-02-08 (×11): 25 [IU] via SUBCUTANEOUS
  Filled 2019-02-03 (×13): qty 0.25

## 2019-02-03 MED ORDER — METHYLPREDNISOLONE SODIUM SUCC 125 MG IJ SOLR
60.0000 mg | Freq: Two times a day (BID) | INTRAMUSCULAR | Status: AC
  Administered 2019-02-03 – 2019-02-07 (×10): 60 mg via INTRAVENOUS
  Filled 2019-02-03 (×10): qty 2

## 2019-02-03 NOTE — Progress Notes (Signed)
Patient remained in prone position from [place], O2 sats >90% on 10L HFNC. Pt does desat to low 80s  while up to [facility].

## 2019-02-03 NOTE — Progress Notes (Signed)
[facility] PROGRESS NOTE    Progress Note  Nicole Santos  [id number] DOB: [birth year] DOA: 02/02/2019 PCP: Terald Sleeper, PA-C     Brief Narrative:   Nicole Santos is an 48 y.o. female past medical history of diabetes mellitus type 2, hyperlipidemia hypertension comes in with with fever chills and myalgias with a nonproductive cough that started 11 days prior to admission.  Her daughter works at a skilled nursing facility in [place] was recently diagnosed with COVID-19 on 01/19/2019.  In the ED she was found tachycardic satting 75% on room air placed on 5 L and improved to 93 mild elevation in ALT AST hemoglobin stable, chest x-ray showed multifocal infiltrates, her SARS-CoV-2 test was positive on 02/02/2019.  Assessment/Plan:   Acute respiratory failure with hypoxia secondary to acute respiratory disease due to COVID-19 virus: Currently requiring 2 L of high flow nasal cannula to keep saturation above 92%. She was started empirically on IV Remdesivir on admission along with IV steroids. Procalcitonin was less than 0.1, we will not start empiric antibiotics at this time. Her inflammatory markers are elevated. She relates she would like to think about receiving Actemra.  Uncontrolled diabetes mellitus type 2 without complications: Hold metformin. Last A1c on 07/10/2017 was 6.  Also 6.4. We will start on long-acting insulin plus sliding scale as she will be on IV steroids.  Essential hypertension  hypertension seems to be well controlled, her lisinopril was held on admission due to soft blood pressure. We will continue to hold essential hypertension  Hyperlipidemia, Continue statins.    DVT prophylaxis: lovenxo Family Communication:none Disposition Plan/Barrier to D/C: once off oxygen Code Status:     Code Status Orders  (From admission, onward)         Start     Ordered   02/02/19 1507  Full code  Continuous     02/02/19 1506        Code Status  History    This patient has a current code status but no historical code status.   Advance Care Planning Activity        IV Access:    Peripheral IV   Procedures and diagnostic studies:   Dg Chest Port 1 View  Result Date: 02/02/2019 CLINICAL DATA:  Shortness of breath, fever EXAM: PORTABLE CHEST 1 VIEW COMPARISON:  None. FINDINGS: Cardiomegaly. Lung volumes are low. Multifocal bilateral airspace consolidations including dense airspace opacity within the right upper lobe. No large pleural fluid collection. No pneumothorax. IMPRESSION: Multifocal bilateral airspace consolidations suspicious for atypical pneumonia. Electronically Signed   By: Davina Poke M.D.   On: 02/02/2019 12:27     Medical Consultants:    None.  Anti-Infectives:   Remdesivir  Subjective:    Gredmarie F Salow relates she still feels short of breath somewhat nauseated but has not vomited.  Objective:    Vitals:   02/02/19 2100 02/02/19 2324 02/03/19 0415 02/03/19 0802  BP:  127/73 126/80 111/74  Pulse: (!) 102 83 75 92  Resp: 17 (!) 23 19 (!) 21  Temp:  98.1 F (36.7 C) 97.9 F (36.6 C) 98.3 F (36.8 C)  TempSrc:  Oral Oral Oral  SpO2: 92% 92% 90% 91%  Weight:      Height:       SpO2: 91 % O2 Flow Rate (L/min): 10 L/min   Intake/Output Summary (Last 24 hours) at 02/03/2019 0806 Last data filed at 02/03/2019 0500 Gross per 24 hour  Intake 490  ml  Output 575 ml  Net -85 ml   Filed Weights   02/02/19 1142  Weight: 120.2 kg    Exam: General exam: In no acute distress. Respiratory system: Good air movement and diffuse crackles bilaterally. Cardiovascular system: S1 & S2 heard, RRR. No JVD. Gastrointestinal system: Abdomen is nondistended, soft and nontender.  Central nervous system: Alert and oriented. No focal neurological deficits. Extremities: No pedal edema. Skin: No rashes, lesions or ulcers Psychiatry: Judgement and insight appear normal. Mood & affect appropriate.   Data  Reviewed:    Labs: Basic Metabolic Panel: Recent Labs  Lab 02/02/19 1200  NA 137  K 3.5  CL 103  CO2 23  GLUCOSE 125*  BUN 8  CREATININE 0.67  CALCIUM 8.2*   GFR Estimated Creatinine Clearance: 103.7 mL/min (by C-G formula based on SCr of 0.67 mg/dL). Liver Function Tests: Recent Labs  Lab 02/02/19 1200  AST 49*  ALT 58*  ALKPHOS 42  BILITOT 0.6  PROT 7.1  ALBUMIN 3.4*   No results for input(s): LIPASE, AMYLASE in the last 168 hours. No results for input(s): AMMONIA in the last 168 hours. Coagulation profile Recent Labs  Lab 02/02/19 1200  INR 1.0   COVID-19 Labs  Recent Labs    02/02/19 1417 02/02/19 1421 02/03/19 0500  DDIMER 1.06*  --  0.81*  FERRITIN  --  392*  --   LDH 259*  --   --   CRP  --  10.9*  --     Lab Results  Component Value Date   SARSCOV2NAA POSITIVE (A) 02/02/2019    CBC: Recent Labs  Lab 02/02/19 1200 02/03/19 0500  WBC 5.3 4.7  NEUTROABS 4.1 3.9  HGB 14.0 12.9  HCT 42.9 39.5  MCV 91.9 91.0  PLT 206 217   Cardiac Enzymes: No results for input(s): CKTOTAL, CKMB, CKMBINDEX, TROPONINI in the last 168 hours. BNP (last 3 results) No results for input(s): PROBNP in the last 8760 hours. CBG: Recent Labs  Lab 02/02/19 1624 02/02/19 2150  GLUCAP 115* 254*   D-Dimer: Recent Labs    02/02/19 1417 02/03/19 0500  DDIMER 1.06* 0.81*   Hgb A1c: Recent Labs    02/02/19 1417  HGBA1C 6.4*   Lipid Profile: No results for input(s): CHOL, HDL, LDLCALC, TRIG, CHOLHDL, LDLDIRECT in the last 72 hours. Thyroid function studies: No results for input(s): TSH, T4TOTAL, T3FREE, THYROIDAB in the last 72 hours.  Invalid input(s): FREET3 Anemia work up: Recent Labs    02/02/19 1421  FERRITIN 392*   Sepsis Labs: Recent Labs  Lab 02/02/19 1200 02/02/19 1417 02/03/19 0500  PROCALCITON  --  <0.10  --   WBC 5.3  --  4.7  LATICACIDVEN 1.0 1.5  --    Microbiology Recent Results (from the past 240 hour(s))  SARS  Coronavirus 2 [facility] order, Performed in [facility] hospital lab) Nasopharyngeal Nasopharyngeal Swab     Status: Abnormal   Collection Time: 02/02/19 12:01 PM   Specimen: Nasopharyngeal Swab  Result Value Ref Range Status   SARS Coronavirus 2 POSITIVE (A) NEGATIVE Final    Comment: CALLED TO HEATHER CRAWFORD AT 1322 BY HFLYNT 02/02/19 (NOTE) If result is NEGATIVE SARS-CoV-2 target nucleic acids are NOT DETECTED. The SARS-CoV-2 RNA is generally detectable in upper and lower  respiratory specimens during the acute phase of infection. The lowest  concentration of SARS-CoV-2 viral copies this assay can detect is 250  copies / mL. A negative result does not preclude SARS-CoV-2  infection  and should not be used as the sole basis for treatment or other  patient management decisions.  A negative result may occur with  improper specimen collection / handling, submission of specimen other  than nasopharyngeal swab, presence of viral mutation(s) within the  areas targeted by this assay, and inadequate number of viral copies  (<250 copies / mL). A negative result must be combined with clinical  observations, patient history, and epidemiological information. If result is POSITIVE SARS-CoV-2 target nucleic acids are DETECTED. The SARS-CoV-2 RNA is generally de tectable in upper and lower  respiratory specimens during the acute phase of infection.  Positive  results are indicative of active infection with SARS-CoV-2.  Clinical  correlation with patient history and other diagnostic information is  necessary to determine patient infection status.  Positive results do  not rule out bacterial infection or co-infection with other viruses. If result is PRESUMPTIVE POSTIVE SARS-CoV-2 nucleic acids MAY BE PRESENT.   A presumptive positive result was obtained on the submitted specimen  and confirmed on repeat testing.  While 2019 novel coronavirus  (SARS-CoV-2) nucleic acids may be present in the  submitted sample  additional confirmatory testing may be necessary for epidemiological  and / or clinical management purposes  to differentiate between  SARS-CoV-2 and other Sarbecovirus currently known to infect humans.  If clinically indicated additional testing with an alternate test  methodology [phone]) is advised. The SARS-CoV-2 RNA is g enerally  detectable in upper and lower respiratory specimens during the acute  phase of infection. The expected result is Negative. Fact Sheet for Patients:  StrictlyIdeas.no Fact Sheet for Healthcare Providers: BankingDealers.co.za This test is not yet approved or cleared by the [place] FDA and has been authorized for detection and/or diagnosis of SARS-CoV-2 by FDA under an Emergency Use Authorization (EUA).  This EUA will remain in effect (meaning this test can be used) for the duration of the COVID-19 declaration under Section 564(b)(1) of the Act, 21 U.S.C. section 360bbb-3(b)(1), unless the authorization is terminated or revoked sooner. Performed at [facility], [address], [place], [place] [address]   Blood Culture (routine x 2)     Status: None (Preliminary result)   Collection Time: 02/02/19 12:05 PM   Specimen: BLOOD  Result Value Ref Range Status   Specimen Description BLOOD RIGHT ANTECUBITAL  Final   Special Requests   Final    BOTTLES DRAWN AEROBIC AND ANAEROBIC Blood Culture adequate volume   Culture   Final    NO GROWTH < 24 HOURS Performed at [facility], [address], [place], [place] [address]    Report Status PENDING  Incomplete  Blood Culture (routine x 2)     Status: None (Preliminary result)   Collection Time: 02/02/19 12:55 PM   Specimen: BLOOD  Result Value Ref Range Status   Specimen Description BLOOD LEFT ANTECUBITAL  Final   Special Requests   Final    BOTTLES DRAWN AEROBIC AND ANAEROBIC Blood Culture adequate volume   Culture   Final    NO GROWTH <  24 HOURS Performed at [facility], [address], [place], [place] [address]    Report Status PENDING  Incomplete     Medications:   . aspirin EC  81 mg Oral Daily  . atorvastatin  20 mg Oral Daily  . dexamethasone (DECADRON) injection  6 mg Intravenous Q24H  . enoxaparin (LOVENOX) injection  40 mg Subcutaneous Q24H  . famotidine  20 mg Oral Daily  .  insulin aspart  0-15 Units Subcutaneous TID WC  . insulin aspart  0-5 Units Subcutaneous QHS   Continuous Infusions: . sodium chloride Stopped (02/02/19 1417)  . remdesivir 100 mg in NS 250 mL       LOS: 1 day   Charlynne Cousins  [facility]  02/03/2019, 8:06 AM

## 2019-02-03 NOTE — Progress Notes (Signed)
Daughter updated over the phone

## 2019-02-04 LAB — BLOOD CULTURE ID PANEL (REFLEXED)

## 2019-02-04 LAB — COMPREHENSIVE METABOLIC PANEL
ALT: 37 U/L (ref 0–44)
AST: 24 U/L (ref 15–41)
Albumin: 3 g/dL — ABNORMAL LOW (ref 3.5–5.0)
Alkaline Phosphatase: 40 U/L (ref 38–126)
Anion gap: 13 (ref 5–15)
BUN: 16 mg/dL (ref 6–20)
CO2: 25 mmol/L (ref 22–32)
Calcium: 8.3 mg/dL — ABNORMAL LOW (ref 8.9–10.3)
Chloride: 105 mmol/L (ref 98–111)
Creatinine, Ser: 0.64 mg/dL (ref 0.44–1.00)
GFR calc Af Amer: >60 mL/min (ref >60)
GFR calc non Af Amer: >60 mL/min (ref >60)
Glucose, Bld: 140 mg/dL — ABNORMAL HIGH (ref 70–99)
Potassium: 3.7 mmol/L (ref 3.5–5.1)
Sodium: 143 mmol/L (ref 135–145)
Total Bilirubin: 0.4 mg/dL (ref 0.3–1.2)
Total Protein: 6.6 g/dL (ref 6.5–8.1)

## 2019-02-04 LAB — CBC WITH DIFFERENTIAL/PLATELET
Abs Immature Granulocytes: 0.05 10*3/uL (ref 0.00–0.07)
Basophils Absolute: 0 10*3/uL (ref 0.0–0.1)
Basophils Relative: 0 %
Eosinophils Absolute: 0 10*3/uL (ref 0.0–0.5)
Eosinophils Relative: 0 %
HCT: 40.6 % (ref 36.0–46.0)
Hemoglobin: 13.3 g/dL (ref 12.0–15.0)
Immature Granulocytes: 1 %
Lymphocytes Relative: 14 %
Lymphs Abs: 0.8 10*3/uL (ref 0.7–4.0)
MCH: 29.8 pg (ref 26.0–34.0)
MCHC: 32.8 g/dL (ref 30.0–36.0)
MCV: 90.8 fL (ref 80.0–100.0)
Monocytes Absolute: 0.4 10*3/uL (ref 0.1–1.0)
Monocytes Relative: 6 %
Neutro Abs: 4.7 10*3/uL (ref 1.7–7.7)
Neutrophils Relative %: 79 %
Platelets: 295 10*3/uL (ref 150–400)
RBC: 4.47 MIL/uL (ref 3.87–5.11)
RDW: 12.6 % (ref 11.5–15.5)
WBC: 5.9 10*3/uL (ref 4.0–10.5)
nRBC: 0 % (ref 0.0–0.2)

## 2019-02-04 LAB — URINE CULTURE: Culture: >=100000 — AB

## 2019-02-04 LAB — C-REACTIVE PROTEIN: CRP: 6.5 mg/dL — ABNORMAL HIGH (ref <1.0)

## 2019-02-04 LAB — GLUCOSE, CAPILLARY
Glucose-Capillary: 191 mg/dL — ABNORMAL HIGH (ref 70–99)
Glucose-Capillary: 136 mg/dL — ABNORMAL HIGH (ref 70–99)
Glucose-Capillary: 129 mg/dL — ABNORMAL HIGH (ref 70–99)
Glucose-Capillary: 140 mg/dL — ABNORMAL HIGH (ref 70–99)
Glucose-Capillary: 126 mg/dL — ABNORMAL HIGH (ref 70–99)

## 2019-02-04 LAB — MAGNESIUM: Magnesium: 2.5 mg/dL — ABNORMAL HIGH (ref 1.7–2.4)

## 2019-02-04 LAB — FERRITIN: Ferritin: 459 ng/mL — ABNORMAL HIGH (ref 11–307)

## 2019-02-04 LAB — D-DIMER, QUANTITATIVE: D-Dimer, Quant: 1.04 ug/mL-FEU — ABNORMAL HIGH (ref 0.00–0.50)

## 2019-02-04 MED ORDER — VITAMIN C 500 MG PO TABS
500.0000 mg | ORAL_TABLET | Freq: Every day | ORAL | Status: DC
  Administered 2019-02-04 – 2019-02-09 (×6): 500 mg via ORAL
  Filled 2019-02-04 (×6): qty 1

## 2019-02-04 MED ORDER — ZINC SULFATE 220 (50 ZN) MG PO CAPS
220.0000 mg | ORAL_CAPSULE | Freq: Every day | ORAL | Status: DC
  Administered 2019-02-04 – 2019-02-09 (×6): 220 mg via ORAL
  Filled 2019-02-04 (×6): qty 1

## 2019-02-04 MED ORDER — ENOXAPARIN SODIUM 40 MG/0.4ML ~~LOC~~ SOLN
40.0000 mg | Freq: Two times a day (BID) | SUBCUTANEOUS | Status: DC
  Administered 2019-02-04 – 2019-02-07 (×7): 40 mg via SUBCUTANEOUS
  Filled 2019-02-04 (×8): qty 0.4

## 2019-02-04 NOTE — Care Management (Signed)
Case manager will continue to follow patient for any discharge needs as she medically improves. May God BLess her to do so. Patient is from home.    Ricki Miller, RN BSN Case Manager [phone]

## 2019-02-04 NOTE — Progress Notes (Signed)
Patient Nicole Santos 89. Called on call provider Dr. Bridgett Larsson regarding nightly dose of 25U of Levemir. Verbal order given to hold dose.

## 2019-02-04 NOTE — Progress Notes (Signed)
PHARMACY - PHYSICIAN COMMUNICATION CRITICAL VALUE ALERT - BLOOD CULTURE IDENTIFICATION (BCID)  Nicole Santos is an 48 y.o. female who presented to [facility] on 02/02/2019 with a chief complaint of acute respiratory failure due to COVID-19  Assessment:  1/4 bottles, aerobic, GPC, nothing detected per BCID (include suspected source if known)  Name of physician (or Provider) Contacted: Bonnielee Haff  Current antibiotics: none  Changes to prescribed antibiotics recommended:  none  Results for orders placed or performed during the hospital encounter of 02/02/19  Blood Culture ID Panel (Reflexed) (Collected: 02/02/2019 12:05 PM)  Result Value Ref Range   Enterococcus species NOT DETECTED NOT DETECTED   Listeria monocytogenes NOT DETECTED NOT DETECTED   Staphylococcus species NOT DETECTED NOT DETECTED   Staphylococcus aureus (BCID) NOT DETECTED NOT DETECTED   Streptococcus species NOT DETECTED NOT DETECTED   Streptococcus agalactiae NOT DETECTED NOT DETECTED   Streptococcus pneumoniae NOT DETECTED NOT DETECTED   Streptococcus pyogenes NOT DETECTED NOT DETECTED   Acinetobacter baumannii NOT DETECTED NOT DETECTED   Enterobacteriaceae species NOT DETECTED NOT DETECTED   Enterobacter cloacae complex NOT DETECTED NOT DETECTED   Escherichia coli NOT DETECTED NOT DETECTED   Klebsiella oxytoca NOT DETECTED NOT DETECTED   Klebsiella pneumoniae NOT DETECTED NOT DETECTED   Proteus species NOT DETECTED NOT DETECTED   Serratia marcescens NOT DETECTED NOT DETECTED   Haemophilus influenzae NOT DETECTED NOT DETECTED   Neisseria meningitidis NOT DETECTED NOT DETECTED   Pseudomonas aeruginosa NOT DETECTED NOT DETECTED   Candida albicans NOT DETECTED NOT DETECTED   Candida glabrata NOT DETECTED NOT DETECTED   Candida krusei NOT DETECTED NOT DETECTED   Candida parapsilosis NOT DETECTED NOT DETECTED   Candida tropicalis NOT DETECTED NOT DETECTED    Napoleon Form 02/04/2019  2:14 PM

## 2019-02-04 NOTE — Progress Notes (Signed)
PROGRESS NOTE  Nicole Santos [id number] DOB: [birth year] DOA: 02/02/2019  PCP: Terald Sleeper, PA-C  Brief History/Interval Summary: 48 y.o. female past medical history of diabetes mellitus type 2, hyperlipidemia hypertension presented with fever chills and myalgias with a nonproductive cough that started 11 days prior to admission.  Her daughter works at a skilled nursing facility in [place] was recently diagnosed with COVID-19 on 01/19/2019.  In the ED she was found to be tachycardic satting 75% on room air placed on 5 L and improved to 93 mild elevation in ALT AST hemoglobin stable, chest x-ray showed multifocal infiltrates, her SARS-CoV-2 test was positive on 02/02/2019.  She was hospitalized for further management.  Reason for Visit: Acute respiratory disease with hypoxia due to COVID-19  Consultants: None  Procedures: None yet  Antibiotics: Anti-infectives (From admission, onward)   Start     Dose/Rate Route Frequency Ordered Stop   02/03/19 2130  remdesivir 100 mg in sodium chloride 0.9 % 250 mL IVPB     100 mg 500 mL/hr over 30 Minutes Intravenous Every 24 hours 02/02/19 2029 02/07/19 2129   02/02/19 2130  remdesivir 200 mg in sodium chloride 0.9 % 250 mL IVPB     200 mg 500 mL/hr over 30 Minutes Intravenous Once 02/02/19 2029 02/02/19 2150       Subjective/Interval History: Patient states that she is feeling about the same this morning as yesterday.  Still continues to have shortness of breath more so with exertion.  While at rest she is comfortable.  Occasional cough without expectoration.  No nausea or vomiting.  Denies any abdominal pain.    Assessment/Plan:  Acute Hypoxic Resp. Failure due to Acute Covid 19 Viral Illness  COVID-19 Labs  Recent Labs    02/02/19 1417 02/02/19 1421 02/03/19 0500 02/04/19 0410  DDIMER 1.06*  --  0.81* 1.04*  FERRITIN  --  392* 410* 459*  LDH 259*  --   --   --   CRP  --  10.9* 13.4* 6.5*    Lab Results   Component Value Date   SARSCOV2NAA POSITIVE (A) 02/02/2019     Fever: No fever since admission Oxygen requirements: Currently on high flow nasal cannula at 15 L/min saturating in the late 80s Antibacterials: None Remdesivir: Day 3 today Steroids: On Solu-Medrol 60 mg every 12 hours Diuretics: None Actemra: Patient has declined Vitamin C and Zinc: Will initiate DVT Prophylaxis: On Lovenox  From a respiratory standpoint patient remains tenuous.  She is on high flow nasal cannula at 15 L/min.  She has been trying to sleep in the prone position as much as possible.  Incentive spirometry.  Mobilization.  Continue steroids and Remdesivir.  Patient does not want to do Actemra at this time.  Convalescent plasma was discussed with her and she will review the information provided.  Inflammatory markers have improved some.  CRP down to 6.5.  Ferritin 459.  D-dimer 1.04.  Procalcitonin was less than 0.1.  Diabetes mellitus type 2, uncontrolled with hyperglycemia Metformin is on hold.  HbA1c 6.4.  Monitor CBGs.  Patient is on Levemir which will be continued.  Sliding scale coverage.  Essential hypertension Blood pressure seems to be reasonably well controlled.  Continue to monitor closely.  Lisinopril is on hold.  Have not been checked in a couple of days.  We will order.  Hyperlipidemia Statin being continued.  Positive urine cultures Patient without any urinary symptoms.  Normal WBC.  Normal procalcitonin.  Hold off on treatment.  Bacteremia 1 out of 4 blood culture sets positive for gram-positive bacteria.  Most likely a contaminant.  Await final identification.  No indication to add antibiotics currently.   Morbid obesity Estimated body mass index is 48.47 kg/m as calculated from the following:   Height as of this encounter: 5\' 2"  (1.575 m).   Weight as of this encounter: 120.2 kg.  DVT Prophylaxis: Lovenox PUD Prophylaxis: Pepcid Code Status: Full code Family Communication:  Discussed with the patient Disposition Plan: Management as outlined above.   Medications:  Scheduled: . aspirin EC  81 mg Oral Daily  . atorvastatin  20 mg Oral Daily  . enoxaparin (LOVENOX) injection  40 mg Subcutaneous Q24H  . famotidine  20 mg Oral Daily  . insulin aspart  0-15 Units Subcutaneous TID WC  . insulin aspart  0-5 Units Subcutaneous QHS  . insulin detemir  25 Units Subcutaneous BID  . methylPREDNISolone (SOLU-MEDROL) injection  60 mg Intravenous Q12H   Continuous: . sodium chloride Stopped (02/02/19 1417)  . remdesivir 100 mg in NS 250 mL Stopped (02/03/19 2115)   [id number], guaiFENesin-dextromethorphan, ondansetron **OR** ondansetron (ZOFRAN) IV, sodium chloride   Objective:  Vital Signs  Vitals:   02/04/19 0600 02/04/19 0657 02/04/19 0703 02/04/19 0811  BP:    124/76  Pulse: 78 (!) 101 (!) 108 94  Resp: (!) 26  (!) 22 17  Temp:    98 F (36.7 C)  TempSrc:    Oral  SpO2: 91% (!) 75% (!) 84% (!) 88%  Weight:      Height:        Intake/Output Summary (Last 24 hours) at 02/04/2019 1052 Last data filed at 02/04/2019 0928 Gross per 24 hour  Intake 1110 ml  Output 300 ml  Net 810 ml   Filed Weights   02/02/19 1142  Weight: 120.2 kg    General appearance: Awake alert.  In no distress.  Morbidly obese Resp: Mildly tachypneic at rest.  Coarse breath sounds bilaterally with crackles bilateral bases.  No wheezing or rhonchi.   Cardio: S1-S2 is normal regular.  No S3-S4.  No rubs murmurs or bruit GI: Abdomen is soft.  Nontender nondistended.  Bowel sounds are present normal.  No masses organomegaly Extremities: No edema.  Full range of motion of lower extremities. Neurologic: Alert and oriented x3.  No focal neurological deficits.    Lab Results:  Data Reviewed: I have personally reviewed following labs and imaging studies  CBC: Recent Labs  Lab 02/02/19 1200 02/03/19 0500 02/04/19 0410  WBC 5.3 4.7 5.9  NEUTROABS 4.1 3.9 4.7  HGB 14.0  12.9 13.3  HCT 42.9 39.5 40.6  MCV 91.9 91.0 90.8  PLT 206 217 [id number]    Basic Metabolic Panel: Recent Labs  Lab 02/02/19 1200 02/03/19 0500 02/04/19 0410  NA 137  --   --   K 3.5  --   --   CL 103  --   --   CO2 23  --   --   GLUCOSE 125*  --   --   BUN 8  --   --   CREATININE 0.67  --   --   CALCIUM 8.2*  --   --   MG  --  2.2 2.5*    GFR: Estimated Creatinine Clearance: 103.7 mL/min (by C-G formula based on SCr of 0.67 mg/dL).  Liver Function Tests: Recent Labs  Lab 02/02/19 1200  AST 49*  ALT 58*  ALKPHOS 42  BILITOT 0.6  PROT 7.1  ALBUMIN 3.4*     Coagulation Profile: Recent Labs  Lab 02/02/19 1200  INR 1.0    HbA1C: Recent Labs    02/02/19 1417  HGBA1C 6.4*    CBG: Recent Labs  Lab 02/03/19 0801 02/03/19 1206 02/03/19 1645 02/03/19 2054 02/04/19 0809  GLUCAP 156* 133* 123* 191* 136*    Anemia Panel: Recent Labs    02/03/19 0500 02/04/19 0410  FERRITIN 410* 459*    Recent Results (from the past 240 hour(s))  Urine culture     Status: Abnormal   Collection Time: 02/02/19 12:00 PM   Specimen: In/Out Cath Urine  Result Value Ref Range Status   Specimen Description   Final    IN/OUT CATH URINE Performed at [facility], [address], [place], [place] [address]    Special Requests   Final    NONE Performed at [facility], [address] [place], [place] [address]    Culture >=100,000 COLONIES/mL KLEBSIELLA PNEUMONIAE (A)  Final   Report Status 02/04/2019 FINAL  Final   Organism ID, Bacteria KLEBSIELLA PNEUMONIAE (A)  Final      Susceptibility   Klebsiella pneumoniae - MIC*    AMPICILLIN RESISTANT Resistant     CEFAZOLIN <=4 SENSITIVE Sensitive     CEFTRIAXONE <=1 SENSITIVE Sensitive     CIPROFLOXACIN <=0.25 SENSITIVE Sensitive     GENTAMICIN <=1 SENSITIVE Sensitive     IMIPENEM <=0.25 SENSITIVE Sensitive     NITROFURANTOIN 64 INTERMEDIATE Intermediate     TRIMETH/SULFA <=20 SENSITIVE Sensitive     AMPICILLIN/SULBACTAM  <=2 SENSITIVE Sensitive     PIP/TAZO <=4 SENSITIVE Sensitive     Extended ESBL NEGATIVE Sensitive     * >=100,000 COLONIES/mL KLEBSIELLA PNEUMONIAE  SARS Coronavirus 2 [facility] order, Performed in [facility] hospital lab) Nasopharyngeal Nasopharyngeal Swab     Status: Abnormal   Collection Time: 02/02/19 12:01 PM   Specimen: Nasopharyngeal Swab  Result Value Ref Range Status   SARS Coronavirus 2 POSITIVE (A) NEGATIVE Final    Comment: CALLED TO HEATHER CRAWFORD AT 1322 BY HFLYNT 02/02/19 (NOTE) If result is NEGATIVE SARS-CoV-2 target nucleic acids are NOT DETECTED. The SARS-CoV-2 RNA is generally detectable in upper and lower  respiratory specimens during the acute phase of infection. The lowest  concentration of SARS-CoV-2 viral copies this assay can detect is 250  copies / mL. A negative result does not preclude SARS-CoV-2 infection  and should not be used as the sole basis for treatment or other  patient management decisions.  A negative result may occur with  improper specimen collection / handling, submission of specimen other  than nasopharyngeal swab, presence of viral mutation(s) within the  areas targeted by this assay, and inadequate number of viral copies  (<250 copies / mL). A negative result must be combined with clinical  observations, patient history, and epidemiological information. If result is POSITIVE SARS-CoV-2 target nucleic acids are DETECTED. The SARS-CoV-2 RNA is generally de tectable in upper and lower  respiratory specimens during the acute phase of infection.  Positive  results are indicative of active infection with SARS-CoV-2.  Clinical  correlation with patient history and other diagnostic information is  necessary to determine patient infection status.  Positive results do  not rule out bacterial infection or co-infection with other viruses. If result is PRESUMPTIVE POSTIVE SARS-CoV-2 nucleic acids MAY BE PRESENT.   A presumptive positive result was  obtained on the submitted specimen  and confirmed on repeat testing.  While 2019 novel coronavirus  (SARS-CoV-2) nucleic acids may be present in the submitted sample  additional confirmatory testing may be necessary for epidemiological  and / or clinical management purposes  to differentiate between  SARS-CoV-2 and other Sarbecovirus currently known to infect humans.  If clinically indicated additional testing with an alternate test  methodology [phone]) is advised. The SARS-CoV-2 RNA is g enerally  detectable in upper and lower respiratory specimens during the acute  phase of infection. The expected result is Negative. Fact Sheet for Patients:  StrictlyIdeas.no Fact Sheet for Healthcare Providers: BankingDealers.co.za This test is not yet approved or cleared by the [place] FDA and has been authorized for detection and/or diagnosis of SARS-CoV-2 by FDA under an Emergency Use Authorization (EUA).  This EUA will remain in effect (meaning this test can be used) for the duration of the COVID-19 declaration under Section 564(b)(1) of the Act, 21 U.S.C. section 360bbb-3(b)(1), unless the authorization is terminated or revoked sooner. Performed at [facility], [address], [place], [place] [address]   Blood Culture (routine x 2)     Status: None (Preliminary result)   Collection Time: 02/02/19 12:05 PM   Specimen: BLOOD  Result Value Ref Range Status   Specimen Description BLOOD RIGHT ANTECUBITAL  Final   Special Requests   Final    BOTTLES DRAWN AEROBIC AND ANAEROBIC Blood Culture adequate volume   Culture  Setup Time   Final    AEROBIC BOTTLE ONLY GRAM POSITIVE COCCI Gram Stain Report Called to,Read Back By and Verified With: CLAIRE,RN @0535  02/04/19 MKELLY    Culture   Final    NO GROWTH < 24 HOURS Performed at [facility], [address], [place], [place] [address]    Report Status PENDING  Incomplete  Blood Culture  (routine x 2)     Status: None (Preliminary result)   Collection Time: 02/02/19 12:55 PM   Specimen: BLOOD  Result Value Ref Range Status   Specimen Description BLOOD LEFT ANTECUBITAL  Final   Special Requests   Final    BOTTLES DRAWN AEROBIC AND ANAEROBIC Blood Culture adequate volume   Culture   Final    NO GROWTH < 24 HOURS Performed at [facility], [address], [place], [place] [address]    Report Status PENDING  Incomplete      Radiology Studies: Dg Chest Port 1 View  Result Date: 02/02/2019 CLINICAL DATA:  Shortness of breath, fever EXAM: PORTABLE CHEST 1 VIEW COMPARISON:  None. FINDINGS: Cardiomegaly. Lung volumes are low. Multifocal bilateral airspace consolidations including dense airspace opacity within the right upper lobe. No large pleural fluid collection. No pneumothorax. IMPRESSION: Multifocal bilateral airspace consolidations suspicious for atypical pneumonia. Electronically Signed   By: Davina Poke M.D.   On: 02/02/2019 12:27       LOS: 2 days   [facility] Pager on [email or web address]  02/04/2019, 10:52 AM

## 2019-02-04 NOTE — Progress Notes (Signed)
0655: Assisted patient to [facility], desat to 75% on 12L. Increased O2 to 15L and assisted back to bed, sats increased slowly to 83-86%. RR mid 20s and mildly dyspneic.   Handoff given at bedside with day shift RN Cristie Hem.

## 2019-02-04 NOTE — Progress Notes (Addendum)
Patient in prone position for majority of overnight shift. SpO2 low 90s on 12L HFNC. Patient with poor PO intake, encouraged fluid and nutrition. Independently used IS and flutter valve.   Notified by [facility] lab pt blood cultures positive for gram positive cocci in aerobic bottle only.

## 2019-02-05 LAB — CBC WITH DIFFERENTIAL/PLATELET
Abs Immature Granulocytes: 0.1 10*3/uL — ABNORMAL HIGH (ref 0.00–0.07)
Basophils Absolute: 0 10*3/uL (ref 0.0–0.1)
Basophils Relative: 0 %
Eosinophils Absolute: 0 10*3/uL (ref 0.0–0.5)
Eosinophils Relative: 0 %
HCT: 39.7 % (ref 36.0–46.0)
Hemoglobin: 12.7 g/dL (ref 12.0–15.0)
Immature Granulocytes: 2 %
Lymphocytes Relative: 13 %
Lymphs Abs: 0.9 10*3/uL (ref 0.7–4.0)
MCH: 29.1 pg (ref 26.0–34.0)
MCHC: 32 g/dL (ref 30.0–36.0)
MCV: 91.1 fL (ref 80.0–100.0)
Monocytes Absolute: 0.5 10*3/uL (ref 0.1–1.0)
Monocytes Relative: 7 %
Neutro Abs: 5.3 10*3/uL (ref 1.7–7.7)
Neutrophils Relative %: 78 %
Platelets: 324 10*3/uL (ref 150–400)
RBC: 4.36 MIL/uL (ref 3.87–5.11)
RDW: 12.7 % (ref 11.5–15.5)
WBC: 6.7 10*3/uL (ref 4.0–10.5)
nRBC: 0 % (ref 0.0–0.2)

## 2019-02-05 LAB — COMPREHENSIVE METABOLIC PANEL
ALT: 28 U/L (ref 0–44)
AST: 17 U/L (ref 15–41)
Albumin: 3 g/dL — ABNORMAL LOW (ref 3.5–5.0)
Alkaline Phosphatase: 37 U/L — ABNORMAL LOW (ref 38–126)
Anion gap: 10 (ref 5–15)
BUN: 19 mg/dL (ref 6–20)
CO2: 27 mmol/L (ref 22–32)
Calcium: 7.9 mg/dL — ABNORMAL LOW (ref 8.9–10.3)
Chloride: 107 mmol/L (ref 98–111)
Creatinine, Ser: 0.62 mg/dL (ref 0.44–1.00)
GFR calc Af Amer: >60 mL/min (ref >60)
GFR calc non Af Amer: >60 mL/min (ref >60)
Glucose, Bld: 144 mg/dL — ABNORMAL HIGH (ref 70–99)
Potassium: 3.5 mmol/L (ref 3.5–5.1)
Sodium: 144 mmol/L (ref 135–145)
Total Bilirubin: 0.7 mg/dL (ref 0.3–1.2)
Total Protein: 6.1 g/dL — ABNORMAL LOW (ref 6.5–8.1)

## 2019-02-05 LAB — MAGNESIUM: Magnesium: 2.6 mg/dL — ABNORMAL HIGH (ref 1.7–2.4)

## 2019-02-05 LAB — FERRITIN: Ferritin: 343 ng/mL — ABNORMAL HIGH (ref 11–307)

## 2019-02-05 LAB — D-DIMER, QUANTITATIVE: D-Dimer, Quant: 0.76 ug/mL-FEU — ABNORMAL HIGH (ref 0.00–0.50)

## 2019-02-05 LAB — C-REACTIVE PROTEIN: CRP: 2.2 mg/dL — ABNORMAL HIGH (ref <1.0)

## 2019-02-05 LAB — GLUCOSE, CAPILLARY
Glucose-Capillary: 166 mg/dL — ABNORMAL HIGH (ref 70–99)
Glucose-Capillary: 133 mg/dL — ABNORMAL HIGH (ref 70–99)
Glucose-Capillary: 200 mg/dL — ABNORMAL HIGH (ref 70–99)
Glucose-Capillary: 142 mg/dL — ABNORMAL HIGH (ref 70–99)

## 2019-02-05 MED ORDER — FUROSEMIDE 10 MG/ML IJ SOLN
40.0000 mg | Freq: Once | INTRAMUSCULAR | Status: AC
  Administered 2019-02-05: 40 mg via INTRAVENOUS
  Filled 2019-02-05: qty 4

## 2019-02-05 MED ORDER — POTASSIUM CHLORIDE CRYS ER 20 MEQ PO TBCR
40.0000 meq | EXTENDED_RELEASE_TABLET | Freq: Two times a day (BID) | ORAL | Status: AC
  Administered 2019-02-05 (×2): 40 meq via ORAL
  Filled 2019-02-05 (×2): qty 2

## 2019-02-05 NOTE — Progress Notes (Signed)
PROGRESS NOTE  Nicole Santos [id number] DOB: [birth year] DOA: 02/02/2019  PCP: Terald Sleeper, PA-C  Brief History/Interval Summary: 48 y.o. female past medical history of diabetes mellitus type 2, hyperlipidemia hypertension presented with fever chills and myalgias with a nonproductive cough that started 11 days prior to admission.  Her daughter works at a skilled nursing facility in [place] was recently diagnosed with COVID-19 on 01/19/2019.  In the ED she was found to be tachycardic satting 75% on room air placed on 5 L and improved to 93 mild elevation in ALT AST hemoglobin stable, chest x-ray showed multifocal infiltrates, her SARS-CoV-2 test was positive on 02/02/2019.  She was hospitalized for further management.  Reason for Visit: Acute respiratory disease with hypoxia due to COVID-19  Consultants: None  Procedures: None yet  Antibiotics: Anti-infectives (From admission, onward)   Start     Dose/Rate Route Frequency Ordered Stop   02/03/19 2130  remdesivir 100 mg in sodium chloride 0.9 % 250 mL IVPB     100 mg 500 mL/hr over 30 Minutes Intravenous Every 24 hours 02/02/19 2029 02/07/19 2129   02/02/19 2130  remdesivir 200 mg in sodium chloride 0.9 % 250 mL IVPB     200 mg 500 mL/hr over 30 Minutes Intravenous Once 02/02/19 2029 02/02/19 2150       Subjective/Interval History: Patient states that she is feeling about the same as yesterday.  No worsening in her symptoms.  No improvement either.  Continues to have a dry cough at times.  Trying to do her incentive spirometry.  Denies nausea vomiting or diarrhea.      Assessment/Plan:  Acute Hypoxic Resp. Failure due to Acute Covid 19 Viral Illness  COVID-19 Labs  Recent Labs    02/02/19 1417  02/03/19 0500 02/04/19 0410 02/05/19 0407  DDIMER 1.06*  --  0.81* 1.04* 0.76*  FERRITIN  --    < > 410* 459* 343*  LDH 259*  --   --   --   --   CRP  --    < > 13.4* 6.5* 2.2*   < > = values in this interval not  displayed.    Lab Results  Component Value Date   SARSCOV2NAA POSITIVE (A) 02/02/2019     Fever: Has been afebrile. Oxygen requirements: Remains on HF Iva.  Dropped down to 14 L/min this morning.  Saturating in the early 90s.   Antibacterials: None Remdesivir: Day 4 today Steroids: On Solu-Medrol 60 mg every 12 hours Diuretics: Will be ordered 1 dose of Lasix today Actemra: Patient has declined Vitamin C and Zinc: Continue DVT Prophylaxis: Lovenox 40 mg twice a day  From a respiratory standpoint patient remains tenuous although I feel she has improved slightly this morning.  Her inflammatory markers have improved.  D-dimer remains low at 0.76.  CRP is down to 2.2.  Ferritin 343.  Continue steroids and Remdesivir.  Continue prone positioning, incentive spirometry and mobilization as much as possible.  Both Actemra and convalescent plasma was discussed with patient and she has declined these treatments for now.  Give her a dose of Lasix that it might help her oxygenation.  Diabetes mellitus type 2, uncontrolled with hyperglycemia Metformin is on hold.  HbA1c 6.4.  Monitor CBGs.  Continue Levemir and SSI.  CBGs are reasonably well controlled.  Essential hypertension Blood pressure seems to be reasonably well controlled.  Continue to monitor closely.  Lisinopril is on hold.  Replace potassium.  Hyperlipidemia Statin  being continued.  Positive urine cultures Patient denies any urinary symptoms.  WBC is normal.  Procalcitonin was normal.  Hold off on antibiotics.    Bacteremia 1 out of 4 blood culture sets positive for gram-positive bacteria.  Most likely a contaminant.  Await final identification.  No indication to add antibiotics currently.   Morbid obesity Estimated body mass index is 48.47 kg/m as calculated from the following:   Height as of this encounter: 5\' 2"  (1.575 m).   Weight as of this encounter: 120.2 kg.  DVT Prophylaxis: Lovenox PUD Prophylaxis: Pepcid Code  Status: Full code Family Communication: Discussed with the patient.  She declines updates to family. Disposition Plan: Management as outlined above.   Medications:  Scheduled: . aspirin EC  81 mg Oral Daily  . atorvastatin  20 mg Oral Daily  . enoxaparin (LOVENOX) injection  40 mg Subcutaneous Q12H  . famotidine  20 mg Oral Daily  . insulin aspart  0-15 Units Subcutaneous TID WC  . insulin aspart  0-5 Units Subcutaneous QHS  . insulin detemir  25 Units Subcutaneous BID  . methylPREDNISolone (SOLU-MEDROL) injection  60 mg Intravenous Q12H  . vitamin C  500 mg Oral Daily  . zinc sulfate  220 mg Oral Daily   Continuous: . sodium chloride Stopped (02/02/19 1417)  . remdesivir 100 mg in NS 250 mL Stopped (02/04/19 2200)   [id number], guaiFENesin-dextromethorphan, ondansetron **OR** ondansetron (ZOFRAN) IV, sodium chloride   Objective:  Vital Signs  Vitals:   02/05/19 0200 02/05/19 0318 02/05/19 0500 02/05/19 0721  BP:  132/76  115/85  Pulse: 67 90 78 (!) 104  Resp: (!) 25 (!) 23 (!) 21 18  Temp:  97.7 F (36.5 C)  98.5 F (36.9 C)  TempSrc:  Oral  Oral  SpO2: 90% (!) 87% 92% (!) 87%  Weight:      Height:        Intake/Output Summary (Last 24 hours) at 02/05/2019 1034 Last data filed at 02/05/2019 0800 Gross per 24 hour  Intake 1210 ml  Output 300 ml  Net 910 ml   Filed Weights   02/02/19 1142  Weight: 120.2 kg    General appearance: Awake alert.  In no distress.  Morbidly obese Resp: Mildly tachypneic at rest.  Coarse breath sounds bilaterally with few crackles at the bases.  Improved aeration compared to yesterday.  No wheezing or rhonchi.   Cardio: S1-S2 is normal regular.  No S3-S4.  No rubs murmurs or bruit GI: Abdomen is soft.  Nontender nondistended.  Bowel sounds are present normal.  No masses organomegaly Extremities: No edema.  Full range of motion of lower extremities. Neurologic: Alert and oriented x3.  No focal neurological deficits.    Lab  Results:  Data Reviewed: I have personally reviewed following labs and imaging studies  CBC: Recent Labs  Lab 02/02/19 1200 02/03/19 0500 02/04/19 0410 02/05/19 0407  WBC 5.3 4.7 5.9 6.7  NEUTROABS 4.1 3.9 4.7 5.3  HGB 14.0 12.9 13.3 12.7  HCT 42.9 39.5 40.6 39.7  MCV 91.9 91.0 90.8 91.1  PLT 206 217 295 0000000    Basic Metabolic Panel: Recent Labs  Lab 02/02/19 1200 02/03/19 0500 02/04/19 0410 02/05/19 0407  NA 137  --  143 144  K 3.5  --  3.7 3.5  CL 103  --  105 107  CO2 23  --  25 27  GLUCOSE 125*  --  140* 144*  BUN 8  --  16 19  CREATININE 0.67  --  0.64 0.62  CALCIUM 8.2*  --  8.3* 7.9*  MG  --  2.2 2.5* 2.6*    GFR: Estimated Creatinine Clearance: 103.7 mL/min (by C-G formula based on SCr of 0.62 mg/dL).  Liver Function Tests: Recent Labs  Lab 02/02/19 1200 02/04/19 0410 02/05/19 0407  AST 49* 24 17  ALT 58* 37 28  ALKPHOS 42 40 37*  BILITOT 0.6 0.4 0.7  PROT 7.1 6.6 6.1*  ALBUMIN 3.4* 3.0* 3.0*     Coagulation Profile: Recent Labs  Lab 02/02/19 1200  INR 1.0    HbA1C: Recent Labs    02/02/19 1417  HGBA1C 6.4*    CBG: Recent Labs  Lab 02/04/19 0809 02/04/19 1140 02/04/19 1717 02/04/19 2154 02/05/19 0753  GLUCAP 136* 129* 140* 126* 166*    Anemia Panel: Recent Labs    02/04/19 0410 02/05/19 0407  FERRITIN 459* 343*    Recent Results (from the past 240 hour(s))  Urine culture     Status: Abnormal   Collection Time: 02/02/19 12:00 PM   Specimen: In/Out Cath Urine  Result Value Ref Range Status   Specimen Description   Final    IN/OUT CATH URINE Performed at [facility], [address], [place], [place] [address]    Special Requests   Final    NONE Performed at [facility], [address], [place], [place] [address]    Culture >=100,000 COLONIES/mL KLEBSIELLA PNEUMONIAE (A)  Final   Report Status 02/04/2019 FINAL  Final   Organism ID, Bacteria KLEBSIELLA PNEUMONIAE (A)  Final      Susceptibility   Klebsiella  pneumoniae - MIC*    AMPICILLIN RESISTANT Resistant     CEFAZOLIN <=4 SENSITIVE Sensitive     CEFTRIAXONE <=1 SENSITIVE Sensitive     CIPROFLOXACIN <=0.25 SENSITIVE Sensitive     GENTAMICIN <=1 SENSITIVE Sensitive     IMIPENEM <=0.25 SENSITIVE Sensitive     NITROFURANTOIN 64 INTERMEDIATE Intermediate     TRIMETH/SULFA <=20 SENSITIVE Sensitive     AMPICILLIN/SULBACTAM <=2 SENSITIVE Sensitive     PIP/TAZO <=4 SENSITIVE Sensitive     Extended ESBL NEGATIVE Sensitive     * >=100,000 COLONIES/mL KLEBSIELLA PNEUMONIAE  SARS Coronavirus 2 [facility] order, Performed in [facility] hospital lab) Nasopharyngeal Nasopharyngeal Swab     Status: Abnormal   Collection Time: 02/02/19 12:01 PM   Specimen: Nasopharyngeal Swab  Result Value Ref Range Status   SARS Coronavirus 2 POSITIVE (A) NEGATIVE Final    Comment: CALLED TO HEATHER CRAWFORD AT 1322 BY HFLYNT 02/02/19 (NOTE) If result is NEGATIVE SARS-CoV-2 target nucleic acids are NOT DETECTED. The SARS-CoV-2 RNA is generally detectable in upper and lower  respiratory specimens during the acute phase of infection. The lowest  concentration of SARS-CoV-2 viral copies this assay can detect is 250  copies / mL. A negative result does not preclude SARS-CoV-2 infection  and should not be used as the sole basis for treatment or other  patient management decisions.  A negative result may occur with  improper specimen collection / handling, submission of specimen other  than nasopharyngeal swab, presence of viral mutation(s) within the  areas targeted by this assay, and inadequate number of viral copies  (<250 copies / mL). A negative result must be combined with clinical  observations, patient history, and epidemiological information. If result is POSITIVE SARS-CoV-2 target nucleic acids are DETECTED. The SARS-CoV-2 RNA is generally de tectable in upper and lower  respiratory specimens during the  acute phase of infection.  Positive  results are  indicative of active infection with SARS-CoV-2.  Clinical  correlation with patient history and other diagnostic information is  necessary to determine patient infection status.  Positive results do  not rule out bacterial infection or co-infection with other viruses. If result is PRESUMPTIVE POSTIVE SARS-CoV-2 nucleic acids MAY BE PRESENT.   A presumptive positive result was obtained on the submitted specimen  and confirmed on repeat testing.  While 2019 novel coronavirus  (SARS-CoV-2) nucleic acids may be present in the submitted sample  additional confirmatory testing may be necessary for epidemiological  and / or clinical management purposes  to differentiate between  SARS-CoV-2 and other Sarbecovirus currently known to infect humans.  If clinically indicated additional testing with an alternate test  methodology [phone]) is advised. The SARS-CoV-2 RNA is g enerally  detectable in upper and lower respiratory specimens during the acute  phase of infection. The expected result is Negative. Fact Sheet for Patients:  StrictlyIdeas.no Fact Sheet for Healthcare Providers: BankingDealers.co.za This test is not yet approved or cleared by the [place] FDA and has been authorized for detection and/or diagnosis of SARS-CoV-2 by FDA under an Emergency Use Authorization (EUA).  This EUA will remain in effect (meaning this test can be used) for the duration of the COVID-19 declaration under Section 564(b)(1) of the Act, 21 U.S.C. section 360bbb-3(b)(1), unless the authorization is terminated or revoked sooner. Performed at [facility], [address] [place], [place] [address]   Blood Culture (routine x 2)     Status: None (Preliminary result)   Collection Time: 02/02/19 12:05 PM   Specimen: BLOOD  Result Value Ref Range Status   Specimen Description   Final    BLOOD RIGHT ANTECUBITAL Performed at [facility], [address]  [place], [place] [address]    Special Requests   Final    BOTTLES DRAWN AEROBIC AND ANAEROBIC Blood Culture adequate volume Performed at [facility], [address] [place], [place] [address]    Culture  Setup Time   Final    AEROBIC BOTTLE ONLY GRAM POSITIVE COCCI Gram Stain Report Called to,Read Back By and Verified With: CLAIRE,RN @0535  02/04/19 MKELLY    Culture GRAM POSITIVE COCCI  Final   Report Status PENDING  Incomplete  Blood Culture ID Panel (Reflexed)     Status: None   Collection Time: 02/02/19 12:05 PM  Result Value Ref Range Status   Enterococcus species NOT DETECTED NOT DETECTED Final   Listeria monocytogenes NOT DETECTED NOT DETECTED Final   Staphylococcus species NOT DETECTED NOT DETECTED Final   Staphylococcus aureus (BCID) NOT DETECTED NOT DETECTED Final   Streptococcus species NOT DETECTED NOT DETECTED Final   Streptococcus agalactiae NOT DETECTED NOT DETECTED Final   Streptococcus pneumoniae NOT DETECTED NOT DETECTED Final   Streptococcus pyogenes NOT DETECTED NOT DETECTED Final   Acinetobacter baumannii NOT DETECTED NOT DETECTED Final   Enterobacteriaceae species NOT DETECTED NOT DETECTED Final   Enterobacter cloacae complex NOT DETECTED NOT DETECTED Final   Escherichia coli NOT DETECTED NOT DETECTED Final   Klebsiella oxytoca NOT DETECTED NOT DETECTED Final   Klebsiella pneumoniae NOT DETECTED NOT DETECTED Final   Proteus species NOT DETECTED NOT DETECTED Final   Serratia marcescens NOT DETECTED NOT DETECTED Final   Haemophilus influenzae NOT DETECTED NOT DETECTED Final   Neisseria meningitidis NOT DETECTED NOT DETECTED Final   Pseudomonas aeruginosa NOT DETECTED NOT DETECTED Final   Candida albicans NOT DETECTED NOT  DETECTED Final   Candida glabrata NOT DETECTED NOT DETECTED Final   Candida krusei NOT DETECTED NOT DETECTED Final   Candida parapsilosis NOT DETECTED NOT DETECTED Final   Candida tropicalis NOT DETECTED NOT DETECTED Final    Comment: Performed  at [facility], [place] [address], [place], [place] [address]  Blood Culture (routine x 2)     Status: None (Preliminary result)   Collection Time: 02/02/19 12:55 PM   Specimen: BLOOD  Result Value Ref Range Status   Specimen Description BLOOD LEFT ANTECUBITAL  Final   Special Requests   Final    BOTTLES DRAWN AEROBIC AND ANAEROBIC Blood Culture adequate volume   Culture   Final    NO GROWTH 3 DAYS Performed at [facility] [address], [place], [place] [address]    Report Status PENDING  Incomplete      Radiology Studies: No results found.     LOS: 3 days   Marguetta Windish [employer] on [email or web address]  02/05/2019, 10:34 AM

## 2019-02-05 NOTE — Progress Notes (Signed)
Telephone call to patient's daughter, Demecia Strosnider, updated on plan of care and answered all questions.

## 2019-02-06 LAB — CBC WITH DIFFERENTIAL/PLATELET
Abs Immature Granulocytes: 0.18 10*3/uL — ABNORMAL HIGH (ref 0.00–0.07)
Basophils Absolute: 0 10*3/uL (ref 0.0–0.1)
Basophils Relative: 0 %
Eosinophils Absolute: 0 10*3/uL (ref 0.0–0.5)
Eosinophils Relative: 0 %
HCT: 40.5 % (ref 36.0–46.0)
Hemoglobin: 13.2 g/dL (ref 12.0–15.0)
Immature Granulocytes: 3 %
Lymphocytes Relative: 15 %
Lymphs Abs: 0.9 10*3/uL (ref 0.7–4.0)
MCH: 29.6 pg (ref 26.0–34.0)
MCHC: 32.6 g/dL (ref 30.0–36.0)
MCV: 90.8 fL (ref 80.0–100.0)
Monocytes Absolute: 0.5 10*3/uL (ref 0.1–1.0)
Monocytes Relative: 8 %
Neutro Abs: 4.4 10*3/uL (ref 1.7–7.7)
Neutrophils Relative %: 74 %
Platelets: 319 10*3/uL (ref 150–400)
RBC: 4.46 MIL/uL (ref 3.87–5.11)
RDW: 12.6 % (ref 11.5–15.5)
WBC: 6 10*3/uL (ref 4.0–10.5)
nRBC: 0 % (ref 0.0–0.2)

## 2019-02-06 LAB — COMPREHENSIVE METABOLIC PANEL
ALT: 26 U/L (ref 0–44)
AST: 18 U/L (ref 15–41)
Albumin: 3 g/dL — ABNORMAL LOW (ref 3.5–5.0)
Alkaline Phosphatase: 35 U/L — ABNORMAL LOW (ref 38–126)
Anion gap: 9 (ref 5–15)
BUN: 21 mg/dL — ABNORMAL HIGH (ref 6–20)
CO2: 27 mmol/L (ref 22–32)
Calcium: 7.9 mg/dL — ABNORMAL LOW (ref 8.9–10.3)
Chloride: 109 mmol/L (ref 98–111)
Creatinine, Ser: 0.65 mg/dL (ref 0.44–1.00)
GFR calc Af Amer: >60 mL/min (ref >60)
GFR calc non Af Amer: >60 mL/min (ref >60)
Glucose, Bld: 153 mg/dL — ABNORMAL HIGH (ref 70–99)
Potassium: 3.9 mmol/L (ref 3.5–5.1)
Sodium: 145 mmol/L (ref 135–145)
Total Bilirubin: 0.8 mg/dL (ref 0.3–1.2)
Total Protein: 6.3 g/dL — ABNORMAL LOW (ref 6.5–8.1)

## 2019-02-06 LAB — CULTURE, BLOOD (ROUTINE X 2): Special Requests: ADEQUATE

## 2019-02-06 LAB — MAGNESIUM: Magnesium: 2.6 mg/dL — ABNORMAL HIGH (ref 1.7–2.4)

## 2019-02-06 LAB — GLUCOSE, CAPILLARY
Glucose-Capillary: 144 mg/dL — ABNORMAL HIGH (ref 70–99)
Glucose-Capillary: 136 mg/dL — ABNORMAL HIGH (ref 70–99)
Glucose-Capillary: 140 mg/dL — ABNORMAL HIGH (ref 70–99)

## 2019-02-06 LAB — C-REACTIVE PROTEIN: CRP: 1.1 mg/dL — ABNORMAL HIGH (ref <1.0)

## 2019-02-06 LAB — D-DIMER, QUANTITATIVE: D-Dimer, Quant: 0.78 ug/mL-FEU — ABNORMAL HIGH (ref 0.00–0.50)

## 2019-02-06 LAB — FERRITIN: Ferritin: 311 ng/mL — ABNORMAL HIGH (ref 11–307)

## 2019-02-06 MED ORDER — SALINE SPRAY 0.65 % NA SOLN
2.0000 | Freq: Three times a day (TID) | NASAL | Status: DC | PRN
  Administered 2019-02-07: 09:00:00 2 via NASAL
  Filled 2019-02-06: qty 44

## 2019-02-06 MED ORDER — LOPERAMIDE HCL 2 MG PO CAPS
4.0000 mg | ORAL_CAPSULE | Freq: Once | ORAL | Status: AC
  Administered 2019-02-06: 4 mg via ORAL
  Filled 2019-02-06: qty 2

## 2019-02-06 MED ORDER — FUROSEMIDE 10 MG/ML IJ SOLN
40.0000 mg | Freq: Once | INTRAMUSCULAR | Status: AC
  Administered 2019-02-06: 22:00:00 40 mg via INTRAVENOUS
  Filled 2019-02-06 (×2): qty 4

## 2019-02-06 MED ORDER — LOPERAMIDE HCL 2 MG PO CAPS
2.0000 mg | ORAL_CAPSULE | Freq: Three times a day (TID) | ORAL | Status: DC | PRN
  Administered 2019-02-06 – 2019-02-08 (×2): 2 mg via ORAL
  Filled 2019-02-06 (×2): qty 1

## 2019-02-06 NOTE — TOC Initial Note (Signed)
Transition of Care [facility]) - Initial/Assessment Note    Patient Details  Name: ALAYJAH YONG MRN: [id number] Date of Birth: 1970-10-31  [facility] CM/SW Contact:    Ninfa Meeker, RN Phone Number: [phone] (working remotely) 02/06/2019, 12:18 PM  Clinical Narrative:   48 yr old female admitted with COVID 19. Lives home, was exposed to daughter who has [place] and works at a SNF. Case manager will continue to follow for any discharge needs as patient medically improves, may she be blessed to do so.                      Patient Goals and CMS Choice        Expected Discharge Plan and Services: to be determined                                                Prior Living Arrangements/Services                       Activities of Daily Living Home Assistive Devices/Equipment: Eyeglasses ADL Screening (condition at time of admission) Patient's cognitive ability adequate to safely complete daily activities?: Yes Is the patient deaf or have difficulty hearing?: Yes Does the patient have difficulty seeing, even when wearing glasses/contacts?: Yes Does the patient have difficulty concentrating, remembering, or making decisions?: No Patient able to express need for assistance with ADLs?: Yes Does the patient have difficulty dressing or bathing?: Yes Independently performs ADLs?: Yes (appropriate for developmental age) Does the patient have difficulty walking or climbing stairs?: Yes Weakness of Legs: Both Weakness of Arms/Hands: Both  Permission Sought/Granted                  Emotional Assessment              Admission diagnosis:  Hypoxia [R09.02] COVID-19 virus infection [U07.1] Patient Active Problem List   Diagnosis Date Noted  . Acute respiratory disease due to COVID-19 virus 02/02/2019  . Acute respiratory failure with hypoxia ([place]) 02/02/2019  . Weight gain 08/24/2018  . Diabetes mellitus without complication ([place])  [id number]  . Hyperlipidemia associated with type 2 diabetes mellitus ([place]) 10/03/2016  . Dyspnea on exertion 08/01/2016  . Pain of right breast 08/01/2016  . Sciatica of left side 07/04/2016  . Essential hypertension 07/04/2016  . Cervical cancer ([place]) 10/20/2013   PCP:  Terald Sleeper, PA-C Pharmacy:   [id number] [place], [place] [address] [place] [address] Phone: [phone] Fax: [phone]     Social Determinants of Health (SDOH) Interventions    Readmission Risk Interventions No flowsheet data found.

## 2019-02-06 NOTE — Progress Notes (Signed)
PROGRESS NOTE  Nicole Santos [id number] DOB: 12/30/1970 DOA: 02/02/2019  PCP: Terald Sleeper, PA-C  Brief History/Interval Summary: 48 y.o. female past medical history of diabetes mellitus type 2, hyperlipidemia hypertension presented with fever chills and myalgias with a nonproductive cough that started 11 days prior to admission.  Her daughter works at a skilled nursing facility in [place] was recently diagnosed with COVID-19 on 01/19/2019.  In the ED she was found to be tachycardic satting 75% on room air placed on 5 L and improved to 93 mild elevation in ALT AST hemoglobin stable, chest x-ray showed multifocal infiltrates, her SARS-CoV-2 test was positive on 02/02/2019.  She was hospitalized for further management.  Reason for Visit: Acute respiratory disease with hypoxia due to COVID-19  Consultants: None  Procedures: None yet  Antibiotics: Anti-infectives (From admission, onward)   Start     Dose/Rate Route Frequency Ordered Stop   02/03/19 2130  remdesivir 100 mg in sodium chloride 0.9 % 250 mL IVPB     100 mg 500 mL/hr over 30 Minutes Intravenous Every 24 hours 02/02/19 2029 02/07/19 2129   02/02/19 2130  remdesivir 200 mg in sodium chloride 0.9 % 250 mL IVPB     200 mg 500 mL/hr over 30 Minutes Intravenous Once 02/02/19 2029 02/02/19 2150       Subjective/Interval History: Patient seems to be upset this morning as she had a large episode of diarrhea with fecal incontinence.  From a respiratory standpoint she states that she is about the same.  Denies any chest pain.  Occasional cough which is dry.       Assessment/Plan:  Acute Hypoxic Resp. Failure due to Acute Covid 19 Viral Illness  COVID-19 Labs  Recent Labs    02/04/19 0410 02/05/19 0407 02/06/19 0312  DDIMER 1.04* 0.76* 0.78*  FERRITIN 459* 343* 311*  CRP 6.5* 2.2* 1.1*    Lab Results  Component Value Date   SARSCOV2NAA POSITIVE (A) 02/02/2019     Fever: Remains afebrile Oxygen  requirements: On HF [place].  Down to 10L this morning.  Saturating in the early 90s.   Antibacterials: None Remdesivir: Day 5 today Steroids: On Solu-Medrol 60 mg every 12 hours Diuretics: Lasix x1 given on 9/3.  To be repeated this afternoon.   Actemra: Patient has declined Vitamin C and Zinc: Continue DVT Prophylaxis: Lovenox 40 mg twice a day  From a respiratory standpoint patient seems to be improving.  Her oxygen requirements have reduced.  She was on 18 L yesterday she is down to 10 L today.  Lasix appears to have helped.  Inflammatory markers have improved with CRP down to 1.1.  Continue steroids.  She will complete course of Remdesivir today.  We will give her additional dose of Lasix today.  Incentive spirometry, prone positioning and mobilization as much as possible.  Both Actemra and convalescent plasma was discussed with the patient and he she has declined these treatments for now.  Diabetes mellitus type 2, uncontrolled with hyperglycemia Metformin is on hold.  HbA1c 6.4.  Continue to monitor CBGs.  She is on Levemir and SSI.    Essential hypertension Blood pressure is adequately controlled.  Lisinopril is on hold.  Potassium is better this morning.    Hyperlipidemia Statin being continued.  Positive urine cultures Patient denies any urinary symptoms.  WBC is normal.  Procalcitonin was normal.  Hold off on antibiotics.    Bacteremia 1 out of 4 blood culture sets positive for  gram-positive bacteria.  Micrococcus gluteus identified.  This is found on skin.  This is most likely a contaminant.     Morbid obesity Estimated body mass index is 48.47 kg/m as calculated from the following:   Height as of this encounter: 5\' 2"  (1.575 m).   Weight as of this encounter: 120.2 kg.  DVT Prophylaxis: Lovenox PUD Prophylaxis: Pepcid Code Status: Full code Family Communication: Discussed with patient.  She declines updates to family. Disposition Plan: Not ready for discharge as yet due  to too high O2 requirements.  Mobilize as much as possible.   Medications:  Scheduled: . aspirin EC  81 mg Oral Daily  . atorvastatin  20 mg Oral Daily  . enoxaparin (LOVENOX) injection  40 mg Subcutaneous Q12H  . famotidine  20 mg Oral Daily  . insulin aspart  0-15 Units Subcutaneous TID WC  . insulin aspart  0-5 Units Subcutaneous QHS  . insulin detemir  25 Units Subcutaneous BID  . methylPREDNISolone (SOLU-MEDROL) injection  60 mg Intravenous Q12H  . vitamin C  500 mg Oral Daily  . zinc sulfate  220 mg Oral Daily   Continuous: . remdesivir 100 mg in NS 250 mL Stopped (02/05/19 2115)   [id number], guaiFENesin-dextromethorphan, loperamide, ondansetron **OR** ondansetron (ZOFRAN) IV, sodium chloride   Objective:  Vital Signs  Vitals:   02/05/19 1828 02/06/19 0013 02/06/19 0343 02/06/19 0756  BP:  133/71 129/75 (!) 147/79  Pulse: 89 80 72 66  Resp: (!) 22 (!) 23 20 (!) 25  Temp:  98.2 F (36.8 C) 98.3 F (36.8 C) 97.7 F (36.5 C)  TempSrc:    Oral  SpO2: 94% 93% 94% 93%  Weight:      Height:        Intake/Output Summary (Last 24 hours) at 02/06/2019 1022 Last data filed at 02/05/2019 2200 Gross per 24 hour  Intake 490 ml  Output 1100 ml  Net -610 ml   Filed Weights   02/02/19 1142  Weight: 120.2 kg    General appearance: Awake alert.  In no distress.  Morbidly obese Resp: Mildly tachypneic at rest.  Coarse breath sounds bilaterally with few crackles at the bases.  No wheezing or rhonchi.  Perhaps slight improvement in air entry.   Cardio: S1-S2 is normal regular.  No S3-S4.  No rubs murmurs or bruit GI: Abdomen is soft.  Nontender nondistended.  Bowel sounds are present normal.  No masses organomegaly Extremities: No edema.  Full range of motion of lower extremities. Neurologic: Alert and oriented x3.  No focal neurological deficits.     Lab Results:  Data Reviewed: I have personally reviewed following labs and imaging studies  CBC: Recent Labs   Lab 02/02/19 1200 02/03/19 0500 02/04/19 0410 02/05/19 0407 02/06/19 0312  WBC 5.3 4.7 5.9 6.7 6.0  NEUTROABS 4.1 3.9 4.7 5.3 4.4  HGB 14.0 12.9 13.3 12.7 13.2  HCT 42.9 39.5 40.6 39.7 40.5  MCV 91.9 91.0 90.8 91.1 90.8  PLT 206 217 295 324 [id number]    Basic Metabolic Panel: Recent Labs  Lab 02/02/19 1200 02/03/19 0500 02/04/19 0410 02/05/19 0407 02/06/19 0312  NA 137  --  143 144 145  K 3.5  --  3.7 3.5 3.9  CL 103  --  105 107 109  CO2 23  --  25 27 27   GLUCOSE 125*  --  140* 144* 153*  BUN 8  --  16 19 21*  CREATININE 0.67  --  0.64 0.62  0.65  CALCIUM 8.2*  --  8.3* 7.9* 7.9*  MG  --  2.2 2.5* 2.6* 2.6*    GFR: Estimated Creatinine Clearance: 103.7 mL/min (by C-G formula based on SCr of 0.65 mg/dL).  Liver Function Tests: Recent Labs  Lab 02/02/19 1200 02/04/19 0410 02/05/19 0407 02/06/19 0312  AST 49* 24 17 18   ALT 58* 37 28 26  ALKPHOS 42 40 37* 35*  BILITOT 0.6 0.4 0.7 0.8  PROT 7.1 6.6 6.1* 6.3*  ALBUMIN 3.4* 3.0* 3.0* 3.0*     Coagulation Profile: Recent Labs  Lab 02/02/19 1200  INR 1.0    HbA1C: No results for input(s): HGBA1C in the last 72 hours.  CBG: Recent Labs  Lab 02/05/19 0753 02/05/19 1143 02/05/19 1611 02/05/19 2035 02/06/19 0754  GLUCAP 166* 133* 200* 142* 144*    Anemia Panel: Recent Labs    02/05/19 0407 02/06/19 0312  FERRITIN 343* 311*    Recent Results (from the past 240 hour(s))  Urine culture     Status: Abnormal   Collection Time: 02/02/19 12:00 PM   Specimen: In/Out Cath Urine  Result Value Ref Range Status   Specimen Description   Final    IN/OUT CATH URINE Performed at [facility], [address], [place], [place] [address]    Special Requests   Final    NONE Performed at [facility], [address] [place], [place] [address]    Culture >=100,000 COLONIES/mL KLEBSIELLA PNEUMONIAE (A)  Final   Report Status 02/04/2019 FINAL  Final   Organism ID, Bacteria KLEBSIELLA PNEUMONIAE (A)  Final       Susceptibility   Klebsiella pneumoniae - MIC*    AMPICILLIN RESISTANT Resistant     CEFAZOLIN <=4 SENSITIVE Sensitive     CEFTRIAXONE <=1 SENSITIVE Sensitive     CIPROFLOXACIN <=0.25 SENSITIVE Sensitive     GENTAMICIN <=1 SENSITIVE Sensitive     IMIPENEM <=0.25 SENSITIVE Sensitive     NITROFURANTOIN 64 INTERMEDIATE Intermediate     TRIMETH/SULFA <=20 SENSITIVE Sensitive     AMPICILLIN/SULBACTAM <=2 SENSITIVE Sensitive     PIP/TAZO <=4 SENSITIVE Sensitive     Extended ESBL NEGATIVE Sensitive     * >=100,000 COLONIES/mL KLEBSIELLA PNEUMONIAE  SARS Coronavirus 2 [facility] order, Performed in [facility] hospital lab) Nasopharyngeal Nasopharyngeal Swab     Status: Abnormal   Collection Time: 02/02/19 12:01 PM   Specimen: Nasopharyngeal Swab  Result Value Ref Range Status   SARS Coronavirus 2 POSITIVE (A) NEGATIVE Final    Comment: CALLED TO HEATHER CRAWFORD AT 1322 BY HFLYNT 02/02/19 (NOTE) If result is NEGATIVE SARS-CoV-2 target nucleic acids are NOT DETECTED. The SARS-CoV-2 RNA is generally detectable in upper and lower  respiratory specimens during the acute phase of infection. The lowest  concentration of SARS-CoV-2 viral copies this assay can detect is 250  copies / mL. A negative result does not preclude SARS-CoV-2 infection  and should not be used as the sole basis for treatment or other  patient management decisions.  A negative result may occur with  improper specimen collection / handling, submission of specimen other  than nasopharyngeal swab, presence of viral mutation(s) within the  areas targeted by this assay, and inadequate number of viral copies  (<250 copies / mL). A negative result must be combined with clinical  observations, patient history, and epidemiological information. If result is POSITIVE SARS-CoV-2 target nucleic acids are DETECTED. The SARS-CoV-2 RNA is generally de tectable in upper and lower  respiratory specimens  during the acute phase of infection.   Positive  results are indicative of active infection with SARS-CoV-2.  Clinical  correlation with patient history and other diagnostic information is  necessary to determine patient infection status.  Positive results do  not rule out bacterial infection or co-infection with other viruses. If result is PRESUMPTIVE POSTIVE SARS-CoV-2 nucleic acids MAY BE PRESENT.   A presumptive positive result was obtained on the submitted specimen  and confirmed on repeat testing.  While 2019 novel coronavirus  (SARS-CoV-2) nucleic acids may be present in the submitted sample  additional confirmatory testing may be necessary for epidemiological  and / or clinical management purposes  to differentiate between  SARS-CoV-2 and other Sarbecovirus currently known to infect humans.  If clinically indicated additional testing with an alternate test  methodology [phone]) is advised. The SARS-CoV-2 RNA is g enerally  detectable in upper and lower respiratory specimens during the acute  phase of infection. The expected result is Negative. Fact Sheet for Patients:  StrictlyIdeas.no Fact Sheet for Healthcare Providers: BankingDealers.co.za This test is not yet approved or cleared by the [place] FDA and has been authorized for detection and/or diagnosis of SARS-CoV-2 by FDA under an Emergency Use Authorization (EUA).  This EUA will remain in effect (meaning this test can be used) for the duration of the COVID-19 declaration under Section 564(b)(1) of the Act, 21 U.S.C. section 360bbb-3(b)(1), unless the authorization is terminated or revoked sooner. Performed at [facility], [address], [place], [place] [address]   Blood Culture (routine x 2)     Status: Abnormal   Collection Time: 02/02/19 12:05 PM   Specimen: BLOOD  Result Value Ref Range Status   Specimen Description   Final    BLOOD RIGHT ANTECUBITAL Performed at [facility], [address]  [place], [place] [address]    Special Requests   Final    BOTTLES DRAWN AEROBIC AND ANAEROBIC Blood Culture adequate volume Performed at [facility], [address], [place], [place] [address]    Culture  Setup Time   Final    AEROBIC BOTTLE ONLY GRAM POSITIVE COCCI Gram Stain Report Called to,Read Back By and Verified With: CLAIRE,RN @0535  02/04/19 MKELLY    Culture (A)  Final    MICROCOCCUS LUTEUS/LYLAE Standardized susceptibility testing for this organism is not available. Performed at [facility], [place] [address] [place],  [address]    Report Status 02/06/2019 FINAL  Final  Blood Culture ID Panel (Reflexed)     Status: None   Collection Time: 02/02/19 12:05 PM  Result Value Ref Range Status   Enterococcus species NOT DETECTED NOT DETECTED Final   Listeria monocytogenes NOT DETECTED NOT DETECTED Final   Staphylococcus species NOT DETECTED NOT DETECTED Final   Staphylococcus aureus (BCID) NOT DETECTED NOT DETECTED Final   Streptococcus species NOT DETECTED NOT DETECTED Final   Streptococcus agalactiae NOT DETECTED NOT DETECTED Final   Streptococcus pneumoniae NOT DETECTED NOT DETECTED Final   Streptococcus pyogenes NOT DETECTED NOT DETECTED Final   Acinetobacter baumannii NOT DETECTED NOT DETECTED Final   Enterobacteriaceae species NOT DETECTED NOT DETECTED Final   Enterobacter cloacae complex NOT DETECTED NOT DETECTED Final   Escherichia coli NOT DETECTED NOT DETECTED Final   Klebsiella oxytoca NOT DETECTED NOT DETECTED Final   Klebsiella pneumoniae NOT DETECTED NOT DETECTED Final   Proteus species NOT DETECTED NOT DETECTED Final   Serratia marcescens NOT DETECTED NOT DETECTED Final   Haemophilus influenzae NOT DETECTED NOT  DETECTED Final   Neisseria meningitidis NOT DETECTED NOT DETECTED Final   Pseudomonas aeruginosa NOT DETECTED NOT DETECTED Final   Candida albicans NOT DETECTED NOT DETECTED Final   Candida glabrata NOT DETECTED NOT DETECTED Final   Candida  krusei NOT DETECTED NOT DETECTED Final   Candida parapsilosis NOT DETECTED NOT DETECTED Final   Candida tropicalis NOT DETECTED NOT DETECTED Final    Comment: Performed at [facility], [place] [address] [place], [place] [address]  Blood Culture (routine x 2)     Status: None (Preliminary result)   Collection Time: 02/02/19 12:55 PM   Specimen: BLOOD  Result Value Ref Range Status   Specimen Description BLOOD LEFT ANTECUBITAL  Final   Special Requests   Final    BOTTLES DRAWN AEROBIC AND ANAEROBIC Blood Culture adequate volume   Culture   Final    NO GROWTH 4 DAYS Performed at [facility], [address], [place], [place] [address]    Report Status PENDING  Incomplete      Radiology Studies: No results found.     LOS: 4 days   Kaylin Marcon [employer] on [email or web address]  02/06/2019, 10:22 AM

## 2019-02-07 LAB — D-DIMER, QUANTITATIVE: D-Dimer, Quant: 0.97 ug/mL-FEU — ABNORMAL HIGH (ref 0.00–0.50)

## 2019-02-07 LAB — CBC WITH DIFFERENTIAL/PLATELET
Abs Immature Granulocytes: 0.2 10*3/uL — ABNORMAL HIGH (ref 0.00–0.07)
Basophils Absolute: 0 10*3/uL (ref 0.0–0.1)
Basophils Relative: 0 %
Eosinophils Absolute: 0 10*3/uL (ref 0.0–0.5)
Eosinophils Relative: 0 %
HCT: 44 % (ref 36.0–46.0)
Hemoglobin: 14.3 g/dL (ref 12.0–15.0)
Immature Granulocytes: 3 %
Lymphocytes Relative: 10 %
Lymphs Abs: 0.8 10*3/uL (ref 0.7–4.0)
MCH: 29.2 pg (ref 26.0–34.0)
MCHC: 32.5 g/dL (ref 30.0–36.0)
MCV: 90 fL (ref 80.0–100.0)
Monocytes Absolute: 0.5 10*3/uL (ref 0.1–1.0)
Monocytes Relative: 6 %
Neutro Abs: 6.3 10*3/uL (ref 1.7–7.7)
Neutrophils Relative %: 81 %
Platelets: 361 10*3/uL (ref 150–400)
RBC: 4.89 MIL/uL (ref 3.87–5.11)
RDW: 12.5 % (ref 11.5–15.5)
WBC: 7.8 10*3/uL (ref 4.0–10.5)
nRBC: 0 % (ref 0.0–0.2)

## 2019-02-07 LAB — COMPREHENSIVE METABOLIC PANEL
ALT: 25 U/L (ref 0–44)
AST: 15 U/L (ref 15–41)
Albumin: 3.3 g/dL — ABNORMAL LOW (ref 3.5–5.0)
Alkaline Phosphatase: 39 U/L (ref 38–126)
Anion gap: 10 (ref 5–15)
BUN: 23 mg/dL — ABNORMAL HIGH (ref 6–20)
CO2: 28 mmol/L (ref 22–32)
Calcium: 8.2 mg/dL — ABNORMAL LOW (ref 8.9–10.3)
Chloride: 103 mmol/L (ref 98–111)
Creatinine, Ser: 0.78 mg/dL (ref 0.44–1.00)
GFR calc Af Amer: >60 mL/min (ref >60)
GFR calc non Af Amer: >60 mL/min (ref >60)
Glucose, Bld: 206 mg/dL — ABNORMAL HIGH (ref 70–99)
Potassium: 4 mmol/L (ref 3.5–5.1)
Sodium: 141 mmol/L (ref 135–145)
Total Bilirubin: 0.9 mg/dL (ref 0.3–1.2)
Total Protein: 6.9 g/dL (ref 6.5–8.1)

## 2019-02-07 LAB — GLUCOSE, CAPILLARY
Glucose-Capillary: 174 mg/dL — ABNORMAL HIGH (ref 70–99)
Glucose-Capillary: 149 mg/dL — ABNORMAL HIGH (ref 70–99)
Glucose-Capillary: 190 mg/dL — ABNORMAL HIGH (ref 70–99)
Glucose-Capillary: 130 mg/dL — ABNORMAL HIGH (ref 70–99)

## 2019-02-07 LAB — CULTURE, BLOOD (ROUTINE X 2)
Culture: NO GROWTH
Special Requests: ADEQUATE

## 2019-02-07 LAB — FERRITIN: Ferritin: 356 ng/mL — ABNORMAL HIGH (ref 11–307)

## 2019-02-07 LAB — MAGNESIUM: Magnesium: 2.6 mg/dL — ABNORMAL HIGH (ref 1.7–2.4)

## 2019-02-07 MED ORDER — PREDNISONE 20 MG PO TABS
60.0000 mg | ORAL_TABLET | Freq: Every day | ORAL | Status: DC
  Administered 2019-02-08 – 2019-02-09 (×2): 60 mg via ORAL
  Filled 2019-02-07 (×2): qty 3

## 2019-02-07 NOTE — Progress Notes (Signed)
Encouraged proning, patient refusing at this time.

## 2019-02-07 NOTE — Progress Notes (Signed)
PROGRESS NOTE  Nicole Santos [id number] DOB: [birth year] DOA: 02/02/2019  PCP: Terald Sleeper, PA-C  Brief History/Interval Summary: 48 y.o. female past medical history of diabetes mellitus type 2, hyperlipidemia hypertension presented with fever chills and myalgias with a nonproductive cough that started 11 days prior to admission.  Her daughter works at a skilled nursing facility in [place] was recently diagnosed with COVID-19 on 01/19/2019.  In the ED she was found to be tachycardic satting 75% on room air placed on 5 L and improved to 93 mild elevation in ALT AST hemoglobin stable, chest x-ray showed multifocal infiltrates, her SARS-CoV-2 test was positive on 02/02/2019.  She was hospitalized for further management.  Reason for Visit: Acute respiratory disease with hypoxia due to COVID-19  Consultants: None  Procedures: None yet  Antibiotics: Anti-infectives (From admission, onward)   Start     Dose/Rate Route Frequency Ordered Stop   02/03/19 2130  remdesivir 100 mg in sodium chloride 0.9 % 250 mL IVPB     100 mg 500 mL/hr over 30 Minutes Intravenous Every 24 hours 02/02/19 2029 02/06/19 2253   02/02/19 2130  remdesivir 200 mg in sodium chloride 0.9 % 250 mL IVPB     200 mg 500 mL/hr over 30 Minutes Intravenous Once 02/02/19 2029 02/02/19 2150       Subjective/Interval History: Patient states that she cannot get much sleep last night.  Continues to have cough episodes.  Denies any further episodes of diarrhea since yesterday afternoon.  No chest pain or shortness of breath.  No nausea vomiting.       Assessment/Plan:  Acute Hypoxic Resp. Failure due to Acute Covid 19 Viral Illness  COVID-19 Labs  Recent Labs    02/05/19 0407 02/06/19 0312 02/07/19 0354  DDIMER 0.76* 0.78* 0.97*  FERRITIN 343* 311* 356*  CRP 2.2* 1.1*  --     Lab Results  Component Value Date   SARSCOV2NAA POSITIVE (A) 02/02/2019     Fever: Afebrile Oxygen requirements: Remains  on HF  but oxygen down to 4 L.  Saturating in the early 90s.   Antibacterials: None Remdesivir: Has completed course Steroids: On Solu-Medrol 60 mg every 12 hours.  Will change to oral steroids from tomorrow Diuretics: Lasix given on 9/3 and 9/4.  Hold today.   Actemra: Patient has declined Vitamin C and Zinc: Continue DVT Prophylaxis: Lovenox 40 mg twice a day  From a respiratory standpoint patient seems to be improving.  She was reassured.  Her oxygen requirements have gone down.  She was on 15 L of oxygen 2 days ago and now she is down to 4 L.  Lasix appears to have helped.  We will hold further doses for now.  Inflammatory markers have improved.  She has completed course of Remdesivir.  Change to oral steroids from tomorrow.  Mobilize.  She needs to ambulate in the hallway.  Continue with incentive spirometry.  Patient declined both Actemra and convalescent plasma.  Diabetes mellitus type 2, uncontrolled with hyperglycemia Metformin is on hold.  HbA1c 6.4.  CBGs are reasonably well controlled.  Continue Levemir and SSI.    Essential hypertension Monitor blood pressures closely.  Lisinopril is on hold.  Potassium is normal today.    Hyperlipidemia Statin being continued.  Positive urine cultures Patient denies any urinary symptoms.  WBC is normal.  Procalcitonin was normal.  Hold off on antibiotics.    Bacteremia 1 out of 4 blood culture sets positive for gram-positive  bacteria.  Micrococcus gluteus identified.  This is usually found on skin.  This is most likely a contaminant.     Morbid obesity Estimated body mass index is 48.47 kg/m as calculated from the following:   Height as of this encounter: 5\' 2"  (1.575 m).   Weight as of this encounter: 120.2 kg.  DVT Prophylaxis: Lovenox PUD Prophylaxis: Pepcid Code Status: Full code Family Communication: Discussed with patient.  She declines updates to family. Disposition Plan: Mobilize.  Ambulate in the hallway.  Continue to  wean down oxygen.  Anticipate discharge in 2 to 3 days.     Medications:  Scheduled: . aspirin EC  81 mg Oral Daily  . atorvastatin  20 mg Oral Daily  . enoxaparin (LOVENOX) injection  40 mg Subcutaneous Q12H  . famotidine  20 mg Oral Daily  . insulin aspart  0-15 Units Subcutaneous TID WC  . insulin aspart  0-5 Units Subcutaneous QHS  . insulin detemir  25 Units Subcutaneous BID  . methylPREDNISolone (SOLU-MEDROL) injection  60 mg Intravenous Q12H  . vitamin C  500 mg Oral Daily  . zinc sulfate  220 mg Oral Daily   Continuous:  [id number], guaiFENesin-dextromethorphan, loperamide, ondansetron **OR** ondansetron (ZOFRAN) IV, sodium chloride   Objective:  Vital Signs  Vitals:   02/06/19 1930 02/07/19 0000 02/07/19 0355 02/07/19 0755  BP: (!) 143/75 116/70 (!) 98/59 132/76  Pulse:    (!) 107  Resp:  (!) 21  (!) 29  Temp: 98.3 F (36.8 C) 98.6 F (37 C) (!) 97.4 F (36.3 C) 98 F (36.7 C)  TempSrc: Oral  Oral Oral  SpO2: 95%   (!) 88%  Weight:      Height:       No intake or output data in the 24 hours ending 02/07/19 0945 Filed Weights   02/02/19 1142  Weight: 120.2 kg    General appearance: Awake alert.  In no distress.  Morbidly obese Resp: Improved effort.  Normal effort at rest.  Coarse breath sounds bilaterally with crackles at the bases.  Improved air entry.  No wheezing or rhonchi.   Cardio: S1-S2 is normal regular.  No S3-S4.  No rubs murmurs or bruit GI: Abdomen is soft.  Nontender nondistended.  Bowel sounds are present normal.  No masses organomegaly Extremities: No edema.  Full range of motion of lower extremities. Neurologic: Alert and oriented x3.  No focal neurological deficits.     Lab Results:  Data Reviewed: I have personally reviewed following labs and imaging studies  CBC: Recent Labs  Lab 02/03/19 0500 02/04/19 0410 02/05/19 0407 02/06/19 0312 02/07/19 0354  WBC 4.7 5.9 6.7 6.0 7.8  NEUTROABS 3.9 4.7 5.3 4.4 6.3  HGB 12.9  13.3 12.7 13.2 14.3  HCT 39.5 40.6 39.7 40.5 44.0  MCV 91.0 90.8 91.1 90.8 90.0  PLT 217 295 324 319 [id number]    Basic Metabolic Panel: Recent Labs  Lab 02/02/19 1200 02/03/19 0500 02/04/19 0410 02/05/19 0407 02/06/19 0312 02/07/19 0354  NA 137  --  143 144 145 141  K 3.5  --  3.7 3.5 3.9 4.0  CL 103  --  105 107 109 103  CO2 23  --  25 27 27 28   GLUCOSE 125*  --  140* 144* 153* 206*  BUN 8  --  16 19 21* 23*  CREATININE 0.67  --  0.64 0.62 0.65 0.78  CALCIUM 8.2*  --  8.3* 7.9* 7.9* 8.2*  MG  --  2.2 2.5* 2.6* 2.6* 2.6*    GFR: Estimated Creatinine Clearance: 103.7 mL/min (by C-G formula based on SCr of 0.78 mg/dL).  Liver Function Tests: Recent Labs  Lab 02/02/19 1200 02/04/19 0410 02/05/19 0407 02/06/19 0312 02/07/19 0354  AST 49* 24 17 18 15   ALT 58* 37 28 26 25   ALKPHOS 42 40 37* 35* 39  BILITOT 0.6 0.4 0.7 0.8 0.9  PROT 7.1 6.6 6.1* 6.3* 6.9  ALBUMIN 3.4* 3.0* 3.0* 3.0* 3.3*     Coagulation Profile: Recent Labs  Lab 02/02/19 1200  INR 1.0    HbA1C: No results for input(s): HGBA1C in the last 72 hours.  CBG: Recent Labs  Lab 02/05/19 2035 02/06/19 0754 02/06/19 1158 02/06/19 2113 02/07/19 0752  GLUCAP 142* 144* 136* 140* 174*    Anemia Panel: Recent Labs    02/06/19 0312 02/07/19 0354  FERRITIN 311* 356*    Recent Results (from the past 240 hour(s))  Urine culture     Status: Abnormal   Collection Time: 02/02/19 12:00 PM   Specimen: In/Out Cath Urine  Result Value Ref Range Status   Specimen Description   Final    IN/OUT CATH URINE Performed at [facility], [address] [place], [place] [address]    Special Requests   Final    NONE Performed at [facility], [address] [place], [place] [address]    Culture >=100,000 COLONIES/mL KLEBSIELLA PNEUMONIAE (A)  Final   Report Status 02/04/2019 FINAL  Final   Organism ID, Bacteria KLEBSIELLA PNEUMONIAE (A)  Final      Susceptibility   Klebsiella pneumoniae - MIC*     AMPICILLIN RESISTANT Resistant     CEFAZOLIN <=4 SENSITIVE Sensitive     CEFTRIAXONE <=1 SENSITIVE Sensitive     CIPROFLOXACIN <=0.25 SENSITIVE Sensitive     GENTAMICIN <=1 SENSITIVE Sensitive     IMIPENEM <=0.25 SENSITIVE Sensitive     NITROFURANTOIN 64 INTERMEDIATE Intermediate     TRIMETH/SULFA <=20 SENSITIVE Sensitive     AMPICILLIN/SULBACTAM <=2 SENSITIVE Sensitive     PIP/TAZO <=4 SENSITIVE Sensitive     Extended ESBL NEGATIVE Sensitive     * >=100,000 COLONIES/mL KLEBSIELLA PNEUMONIAE  SARS Coronavirus 2 [facility] order, Performed in [facility] hospital lab) Nasopharyngeal Nasopharyngeal Swab     Status: Abnormal   Collection Time: 02/02/19 12:01 PM   Specimen: Nasopharyngeal Swab  Result Value Ref Range Status   SARS Coronavirus 2 POSITIVE (A) NEGATIVE Final    Comment: CALLED TO HEATHER CRAWFORD AT 1322 BY HFLYNT 02/02/19 (NOTE) If result is NEGATIVE SARS-CoV-2 target nucleic acids are NOT DETECTED. The SARS-CoV-2 RNA is generally detectable in upper and lower  respiratory specimens during the acute phase of infection. The lowest  concentration of SARS-CoV-2 viral copies this assay can detect is 250  copies / mL. A negative result does not preclude SARS-CoV-2 infection  and should not be used as the sole basis for treatment or other  patient management decisions.  A negative result may occur with  improper specimen collection / handling, submission of specimen other  than nasopharyngeal swab, presence of viral mutation(s) within the  areas targeted by this assay, and inadequate number of viral copies  (<250 copies / mL). A negative result must be combined with clinical  observations, patient history, and epidemiological information. If result is POSITIVE SARS-CoV-2 target nucleic acids are DETECTED. The SARS-CoV-2 RNA is generally de tectable in upper and lower  respiratory specimens during the acute phase of infection.  Positive  results are indicative of active  infection with SARS-CoV-2.  Clinical  correlation with patient history and other diagnostic information is  necessary to determine patient infection status.  Positive results do  not rule out bacterial infection or co-infection with other viruses. If result is PRESUMPTIVE POSTIVE SARS-CoV-2 nucleic acids MAY BE PRESENT.   A presumptive positive result was obtained on the submitted specimen  and confirmed on repeat testing.  While 2019 novel coronavirus  (SARS-CoV-2) nucleic acids may be present in the submitted sample  additional confirmatory testing may be necessary for epidemiological  and / or clinical management purposes  to differentiate between  SARS-CoV-2 and other Sarbecovirus currently known to infect humans.  If clinically indicated additional testing with an alternate test  methodology [phone]) is advised. The SARS-CoV-2 RNA is g enerally  detectable in upper and lower respiratory specimens during the acute  phase of infection. The expected result is Negative. Fact Sheet for Patients:  StrictlyIdeas.no Fact Sheet for Healthcare Providers: BankingDealers.co.za This test is not yet approved or cleared by the [place] FDA and has been authorized for detection and/or diagnosis of SARS-CoV-2 by FDA under an Emergency Use Authorization (EUA).  This EUA will remain in effect (meaning this test can be used) for the duration of the COVID-19 declaration under Section 564(b)(1) of the Act, 21 U.S.C. section 360bbb-3(b)(1), unless the authorization is terminated or revoked sooner. Performed at [facility], [address], [place], [place] [address]   Blood Culture (routine x 2)     Status: Abnormal   Collection Time: 02/02/19 12:05 PM   Specimen: BLOOD  Result Value Ref Range Status   Specimen Description   Final    BLOOD RIGHT ANTECUBITAL Performed at [facility], [address], [place], [place] [address]    Special Requests    Final    BOTTLES DRAWN AEROBIC AND ANAEROBIC Blood Culture adequate volume Performed at [facility], [address] [place], [place] [address]    Culture  Setup Time   Final    AEROBIC BOTTLE ONLY GRAM POSITIVE COCCI Gram Stain Report Called to,Read Back By and Verified With: CLAIRE,RN @0535  02/04/19 MKELLY    Culture (A)  Final    MICROCOCCUS LUTEUS/LYLAE Standardized susceptibility testing for this organism is not available. Performed at [facility], [place] [address] [place], [place] [address]    Report Status 02/06/2019 FINAL  Final  Blood Culture ID Panel (Reflexed)     Status: None   Collection Time: 02/02/19 12:05 PM  Result Value Ref Range Status   Enterococcus species NOT DETECTED NOT DETECTED Final   Listeria monocytogenes NOT DETECTED NOT DETECTED Final   Staphylococcus species NOT DETECTED NOT DETECTED Final   Staphylococcus aureus (BCID) NOT DETECTED NOT DETECTED Final   Streptococcus species NOT DETECTED NOT DETECTED Final   Streptococcus agalactiae NOT DETECTED NOT DETECTED Final   Streptococcus pneumoniae NOT DETECTED NOT DETECTED Final   Streptococcus pyogenes NOT DETECTED NOT DETECTED Final   Acinetobacter baumannii NOT DETECTED NOT DETECTED Final   Enterobacteriaceae species NOT DETECTED NOT DETECTED Final   Enterobacter cloacae complex NOT DETECTED NOT DETECTED Final   Escherichia coli NOT DETECTED NOT DETECTED Final   Klebsiella oxytoca NOT DETECTED NOT DETECTED Final   Klebsiella pneumoniae NOT DETECTED NOT DETECTED Final   Proteus species NOT DETECTED NOT DETECTED Final   Serratia marcescens NOT DETECTED NOT DETECTED Final   Haemophilus influenzae NOT DETECTED NOT DETECTED Final   Neisseria meningitidis NOT  DETECTED NOT DETECTED Final   Pseudomonas aeruginosa NOT DETECTED NOT DETECTED Final   Candida albicans NOT DETECTED NOT DETECTED Final   Candida glabrata NOT DETECTED NOT DETECTED Final   Candida krusei NOT DETECTED NOT DETECTED Final   Candida  parapsilosis NOT DETECTED NOT DETECTED Final   Candida tropicalis NOT DETECTED NOT DETECTED Final    Comment: Performed at [facility], [place] [address], [place], [place] [address]  Blood Culture (routine x 2)     Status: None   Collection Time: 02/02/19 12:55 PM   Specimen: BLOOD  Result Value Ref Range Status   Specimen Description BLOOD LEFT ANTECUBITAL  Final   Special Requests   Final    BOTTLES DRAWN AEROBIC AND ANAEROBIC Blood Culture adequate volume   Culture   Final    NO GROWTH 5 DAYS Performed at [facility], [address], [place], [place] [address]    Report Status 02/07/2019 FINAL  Final      Radiology Studies: No results found.     LOS: 5 days   Rikki Smestad [employer] on [email or web address]  02/07/2019, 9:45 AM

## 2019-02-07 NOTE — Progress Notes (Signed)
Notified daughter of progress.  All questions were answered and this nurse's contact number shared for further communication.   

## 2019-02-08 LAB — BASIC METABOLIC PANEL
Anion gap: 12 (ref 5–15)
BUN: 23 mg/dL — ABNORMAL HIGH (ref 6–20)
CO2: 28 mmol/L (ref 22–32)
Calcium: 8.3 mg/dL — ABNORMAL LOW (ref 8.9–10.3)
Chloride: 102 mmol/L (ref 98–111)
Creatinine, Ser: 0.62 mg/dL (ref 0.44–1.00)
GFR calc Af Amer: >60 mL/min (ref >60)
GFR calc non Af Amer: >60 mL/min (ref >60)
Glucose, Bld: 188 mg/dL — ABNORMAL HIGH (ref 70–99)
Potassium: 4.1 mmol/L (ref 3.5–5.1)
Sodium: 142 mmol/L (ref 135–145)

## 2019-02-08 LAB — GLUCOSE, CAPILLARY
Glucose-Capillary: 144 mg/dL — ABNORMAL HIGH (ref 70–99)
Glucose-Capillary: 162 mg/dL — ABNORMAL HIGH (ref 70–99)

## 2019-02-08 MED ORDER — ENOXAPARIN SODIUM 40 MG/0.4ML ~~LOC~~ SOLN
40.0000 mg | Freq: Two times a day (BID) | SUBCUTANEOUS | Status: DC
  Administered 2019-02-08 – 2019-02-09 (×3): 40 mg via SUBCUTANEOUS
  Filled 2019-02-08 (×2): qty 0.4

## 2019-02-08 NOTE — Progress Notes (Signed)
PROGRESS NOTE  Nicole Santos [id number] DOB: Dec 31, 1970 DOA: 02/02/2019  PCP: Terald Sleeper, PA-C  Brief History/Interval Summary: 48 y.o. female past medical history of diabetes mellitus type 2, hyperlipidemia hypertension presented with fever chills and myalgias with a nonproductive cough that started 11 days prior to admission.  Her daughter works at a skilled nursing facility in [place] was recently diagnosed with COVID-19 on 01/19/2019.  In the ED she was found to be tachycardic satting 75% on room air placed on 5 L and improved to 93 mild elevation in ALT AST hemoglobin stable, chest x-ray showed multifocal infiltrates, her SARS-CoV-2 test was positive on 02/02/2019.  She was hospitalized for further management.  Reason for Visit: Acute respiratory disease with hypoxia due to COVID-19  Consultants: None  Procedures: None yet  Antibiotics: Anti-infectives (From admission, onward)   Start     Dose/Rate Route Frequency Ordered Stop   02/03/19 2130  remdesivir 100 mg in sodium chloride 0.9 % 250 mL IVPB     100 mg 500 mL/hr over 30 Minutes Intravenous Every 24 hours 02/02/19 2029 02/06/19 2253   02/02/19 2130  remdesivir 200 mg in sodium chloride 0.9 % 250 mL IVPB     200 mg 500 mL/hr over 30 Minutes Intravenous Once 02/02/19 2029 02/02/19 2150       Subjective/Interval History: Patient seems to be in better spirits this morning.  She states that she continues to feel better.  Not as short of breath as before.  She still has not ambulated in the hallway.  No nausea vomiting.  Occasional dry cough.      Assessment/Plan:  Acute Hypoxic Resp. Failure due to Acute Covid 19 Viral Illness  COVID-19 Labs  Recent Labs    02/06/19 0312 02/07/19 0354  DDIMER 0.78* 0.97*  FERRITIN 311* 356*  CRP 1.1*  --     Lab Results  Component Value Date   SARSCOV2NAA POSITIVE (A) 02/02/2019     Fever: Afebrile Oxygen requirements: Nasal cannula.  2-1/2 L/min.   Saturating in the early 90s.   Antibacterials: None Remdesivir: Has completed course Steroids: Changed from Solu-Medrol to prednisone this morning. Diuretics: Lasix given on 9/3 and 9/4.    Actemra: Patient declined Vitamin C and Zinc: Continue DVT Prophylaxis: Lovenox 40 mg twice a day  From a respiratory standpoint patient has improved.  Her oxygen requirements have decreased significantly.  She was initially on 15 L of oxygen 3 days ago.  Now she is down to 2-1/2 L.  She has completed course of Remdesivir.  She has been changed over to oral steroids.  She needs to be ambulated.  Continue with incentive spirometry.  May need home oxygen temporarily.  Anticipate discharge tomorrow.  Diabetes mellitus type 2, uncontrolled with hyperglycemia Metformin is on hold.  HbA1c 6.4.  CBGs are reasonably well controlled.  Continue Levemir and SSI.    Essential hypertension Monitor blood pressures closely.  Lisinopril is on hold.  Potassium is normal today.    Hyperlipidemia Statin being continued.  Positive urine cultures Patient denies any urinary symptoms.  WBC is normal.  Procalcitonin was normal.  Hold off on antibiotics.    Bacteremia 1 out of 4 blood culture sets positive for gram-positive bacteria.  Micrococcus gluteus identified.  This is usually found on skin.  This is most likely a contaminant.     Morbid obesity Estimated body mass index is 48.47 kg/m as calculated from the following:   Height as  of this encounter: 5\' 2"  (1.575 m).   Weight as of this encounter: 120.2 kg.  DVT Prophylaxis: Lovenox PUD Prophylaxis: Pepcid Code Status: Full code Family Communication: Discussed with the patient.  She declines updates to family. Disposition Plan: She needs to be ambulated in the hallway.  Check saturations with ambulation.  May need home oxygen.  Anticipate discharge tomorrow.      Medications:  Scheduled: . aspirin EC  81 mg Oral Daily  . atorvastatin  20 mg Oral Daily  .  enoxaparin (LOVENOX) injection  40 mg Subcutaneous Q12H  . famotidine  20 mg Oral Daily  . insulin aspart  0-15 Units Subcutaneous TID WC  . insulin aspart  0-5 Units Subcutaneous QHS  . insulin detemir  25 Units Subcutaneous BID  . predniSONE  60 mg Oral Q breakfast  . vitamin C  500 mg Oral Daily  . zinc sulfate  220 mg Oral Daily   Continuous:  [id number], guaiFENesin-dextromethorphan, loperamide, ondansetron **OR** ondansetron (ZOFRAN) IV, sodium chloride   Objective:  Vital Signs  Vitals:   02/08/19 0018 02/08/19 0437 02/08/19 0600 02/08/19 0808  BP:    119/67  Pulse:    68  Resp:    17  Temp:  98 F (36.7 C)  97.7 F (36.5 C)  TempSrc:  Oral  Oral  SpO2: 90%  93% 92%  Weight:      Height:       No intake or output data in the 24 hours ending 02/08/19 0946 Filed Weights   02/02/19 1142  Weight: 120.2 kg    General appearance: Awake alert.  In no distress.  Morbidly obese Resp: Normal effort at rest.  Improved aeration bilaterally.  Few crackles at the bases.  No wheezing or rhonchi.   Cardio: S1-S2 is normal regular.  No S3-S4.  No rubs murmurs or bruit GI: Abdomen is soft.  Nontender nondistended.  Bowel sounds are present normal.  No masses organomegaly Extremities: No edema.  Full range of motion of lower extremities. Neurologic: Alert and oriented x3.  No focal neurological deficits.    Lab Results:  Data Reviewed: I have personally reviewed following labs and imaging studies  CBC: Recent Labs  Lab 02/03/19 0500 02/04/19 0410 02/05/19 0407 02/06/19 0312 02/07/19 0354  WBC 4.7 5.9 6.7 6.0 7.8  NEUTROABS 3.9 4.7 5.3 4.4 6.3  HGB 12.9 13.3 12.7 13.2 14.3  HCT 39.5 40.6 39.7 40.5 44.0  MCV 91.0 90.8 91.1 90.8 90.0  PLT 217 295 324 319 [id number]    Basic Metabolic Panel: Recent Labs  Lab 02/03/19 0500 02/04/19 0410 02/05/19 0407 02/06/19 0312 02/07/19 0354 02/08/19 0251  NA  --  143 144 145 141 142  K  --  3.7 3.5 3.9 4.0 4.1  CL  --   105 107 109 103 102  CO2  --  25 27 27 28 28   GLUCOSE  --  140* 144* 153* 206* 188*  BUN  --  16 19 21* 23* 23*  CREATININE  --  0.64 0.62 0.65 0.78 0.62  CALCIUM  --  8.3* 7.9* 7.9* 8.2* 8.3*  MG 2.2 2.5* 2.6* 2.6* 2.6*  --     GFR: Estimated Creatinine Clearance: 103.7 mL/min (by C-G formula based on SCr of 0.62 mg/dL).  Liver Function Tests: Recent Labs  Lab 02/02/19 1200 02/04/19 0410 02/05/19 0407 02/06/19 0312 02/07/19 0354  AST 49* 24 17 18 15   ALT 58* 37 28 26 25   ALKPHOS 42  40 37* 35* 39  BILITOT 0.6 0.4 0.7 0.8 0.9  PROT 7.1 6.6 6.1* 6.3* 6.9  ALBUMIN 3.4* 3.0* 3.0* 3.0* 3.3*     Coagulation Profile: Recent Labs  Lab 02/02/19 1200  INR 1.0    HbA1C: No results for input(s): HGBA1C in the last 72 hours.  CBG: Recent Labs  Lab 02/06/19 2113 02/07/19 0752 02/07/19 1243 02/07/19 1602 02/07/19 2108  GLUCAP 140* 174* 149* 190* 130*    Anemia Panel: Recent Labs    02/06/19 0312 02/07/19 0354  FERRITIN 311* 356*    Recent Results (from the past 240 hour(s))  Urine culture     Status: Abnormal   Collection Time: 02/02/19 12:00 PM   Specimen: In/Out Cath Urine  Result Value Ref Range Status   Specimen Description   Final    IN/OUT CATH URINE Performed at [facility], [address], [place], [place] [address]    Special Requests   Final    NONE Performed at [facility], [address] [place],  [address]    Culture >=100,000 COLONIES/mL KLEBSIELLA PNEUMONIAE (A)  Final   Report Status 02/04/2019 FINAL  Final   Organism ID, Bacteria KLEBSIELLA PNEUMONIAE (A)  Final      Susceptibility   Klebsiella pneumoniae - MIC*    AMPICILLIN RESISTANT Resistant     CEFAZOLIN <=4 SENSITIVE Sensitive     CEFTRIAXONE <=1 SENSITIVE Sensitive     CIPROFLOXACIN <=0.25 SENSITIVE Sensitive     GENTAMICIN <=1 SENSITIVE Sensitive     IMIPENEM <=0.25 SENSITIVE Sensitive     NITROFURANTOIN 64 INTERMEDIATE Intermediate     TRIMETH/SULFA <=20 SENSITIVE  Sensitive     AMPICILLIN/SULBACTAM <=2 SENSITIVE Sensitive     PIP/TAZO <=4 SENSITIVE Sensitive     Extended ESBL NEGATIVE Sensitive     * >=100,000 COLONIES/mL KLEBSIELLA PNEUMONIAE  SARS Coronavirus 2 [facility] order, Performed in [facility] hospital lab) Nasopharyngeal Nasopharyngeal Swab     Status: Abnormal   Collection Time: 02/02/19 12:01 PM   Specimen: Nasopharyngeal Swab  Result Value Ref Range Status   SARS Coronavirus 2 POSITIVE (A) NEGATIVE Final    Comment: CALLED TO HEATHER CRAWFORD AT 1322 BY HFLYNT 02/02/19 (NOTE) If result is NEGATIVE SARS-CoV-2 target nucleic acids are NOT DETECTED. The SARS-CoV-2 RNA is generally detectable in upper and lower  respiratory specimens during the acute phase of infection. The lowest  concentration of SARS-CoV-2 viral copies this assay can detect is 250  copies / mL. A negative result does not preclude SARS-CoV-2 infection  and should not be used as the sole basis for treatment or other  patient management decisions.  A negative result may occur with  improper specimen collection / handling, submission of specimen other  than nasopharyngeal swab, presence of viral mutation(s) within the  areas targeted by this assay, and inadequate number of viral copies  (<250 copies / mL). A negative result must be combined with clinical  observations, patient history, and epidemiological information. If result is POSITIVE SARS-CoV-2 target nucleic acids are DETECTED. The SARS-CoV-2 RNA is generally de tectable in upper and lower  respiratory specimens during the acute phase of infection.  Positive  results are indicative of active infection with SARS-CoV-2.  Clinical  correlation with patient history and other diagnostic information is  necessary to determine patient infection status.  Positive results do  not rule out bacterial infection or co-infection with other viruses. If result is PRESUMPTIVE POSTIVE SARS-CoV-2 nucleic acids MAY BE PRESENT.  A presumptive positive result was obtained on the submitted specimen  and confirmed on repeat testing.  While 2019 novel coronavirus  (SARS-CoV-2) nucleic acids may be present in the submitted sample  additional confirmatory testing may be necessary for epidemiological  and / or clinical management purposes  to differentiate between  SARS-CoV-2 and other Sarbecovirus currently known to infect humans.  If clinically indicated additional testing with an alternate test  methodology [id number]) is advised. The SARS-CoV-2 RNA is g enerally  detectable in upper and lower respiratory specimens during the acute  phase of infection. The expected result is Negative. Fact Sheet for Patients:  StrictlyIdeas.no Fact Sheet for Healthcare Providers: BankingDealers.co.za This test is not yet approved or cleared by the [place] FDA and has been authorized for detection and/or diagnosis of SARS-CoV-2 by FDA under an Emergency Use Authorization (EUA).  This EUA will remain in effect (meaning this test can be used) for the duration of the COVID-19 declaration under Section 564(b)(1) of the Act, 21 U.S.C. section 360bbb-3(b)(1), unless the authorization is terminated or revoked sooner. Performed at [facility], [address], [place], [place] [address]   Blood Culture (routine x 2)     Status: Abnormal   Collection Time: 02/02/19 12:05 PM   Specimen: BLOOD  Result Value Ref Range Status   Specimen Description   Final    BLOOD RIGHT ANTECUBITAL Performed at [facility], [address], [place], [place] [address]    Special Requests   Final    BOTTLES DRAWN AEROBIC AND ANAEROBIC Blood Culture adequate volume Performed at [facility], [address] [place], [place] [address]    Culture  Setup Time   Final    AEROBIC BOTTLE ONLY GRAM POSITIVE COCCI Gram Stain Report Called to,Read Back By and Verified With: CLAIRE,RN @0535  02/04/19 MKELLY     Culture (A)  Final    MICROCOCCUS LUTEUS/LYLAE Standardized susceptibility testing for this organism is not available. Performed at [facility], [place] [address], [place], [place] [address]    Report Status 02/06/2019 FINAL  Final  Blood Culture ID Panel (Reflexed)     Status: None   Collection Time: 02/02/19 12:05 PM  Result Value Ref Range Status   Enterococcus species NOT DETECTED NOT DETECTED Final   Listeria monocytogenes NOT DETECTED NOT DETECTED Final   Staphylococcus species NOT DETECTED NOT DETECTED Final   Staphylococcus aureus (BCID) NOT DETECTED NOT DETECTED Final   Streptococcus species NOT DETECTED NOT DETECTED Final   Streptococcus agalactiae NOT DETECTED NOT DETECTED Final   Streptococcus pneumoniae NOT DETECTED NOT DETECTED Final   Streptococcus pyogenes NOT DETECTED NOT DETECTED Final   Acinetobacter baumannii NOT DETECTED NOT DETECTED Final   Enterobacteriaceae species NOT DETECTED NOT DETECTED Final   Enterobacter cloacae complex NOT DETECTED NOT DETECTED Final   Escherichia coli NOT DETECTED NOT DETECTED Final   Klebsiella oxytoca NOT DETECTED NOT DETECTED Final   Klebsiella pneumoniae NOT DETECTED NOT DETECTED Final   Proteus species NOT DETECTED NOT DETECTED Final   Serratia marcescens NOT DETECTED NOT DETECTED Final   Haemophilus influenzae NOT DETECTED NOT DETECTED Final   Neisseria meningitidis NOT DETECTED NOT DETECTED Final   Pseudomonas aeruginosa NOT DETECTED NOT DETECTED Final   Candida albicans NOT DETECTED NOT DETECTED Final   Candida glabrata NOT DETECTED NOT DETECTED Final   Candida krusei NOT DETECTED NOT DETECTED Final   Candida parapsilosis NOT DETECTED NOT DETECTED Final   Candida tropicalis NOT DETECTED NOT DETECTED Final  Comment: Performed at [facility], [place] [address] [place], [place] [address]  Blood Culture (routine x 2)     Status: None   Collection Time: 02/02/19 12:55 PM   Specimen: BLOOD  Result Value Ref Range  Status   Specimen Description BLOOD LEFT ANTECUBITAL  Final   Special Requests   Final    BOTTLES DRAWN AEROBIC AND ANAEROBIC Blood Culture adequate volume   Culture   Final    NO GROWTH 5 DAYS Performed at [facility], [address], [place],  [address]    Report Status 02/07/2019 FINAL  Final      Radiology Studies: No results found.     LOS: 6 days   Kapil Petropoulos [employer] on [email or web address]  02/08/2019, 9:46 AM

## 2019-02-08 NOTE — Progress Notes (Signed)
Ambulated in hallway around corridor twice (approximately 362ft) on 4L/[place] with SPO2 sustained @90 % and HR 130. Back to bed on 1L/[place]. Tolerating without complication.

## 2019-02-08 NOTE — Progress Notes (Signed)
Patient ambulated aprox 20 ft in hallway requiring O2@8L /[place] to maintain SPO2 85%. Recovered sitting on bedside after 2 minutes with 1L/[place].

## 2019-02-09 LAB — GLUCOSE, CAPILLARY
Glucose-Capillary: 59 mg/dL — ABNORMAL LOW (ref 70–99)
Glucose-Capillary: 162 mg/dL — ABNORMAL HIGH (ref 70–99)

## 2019-02-09 MED ORDER — PREDNISONE 20 MG PO TABS: ORAL_TABLET | ORAL | 0 refills | Status: DC

## 2019-02-09 MED ORDER — BLOOD GLUCOSE MONITOR KIT: PACK | 0 refills | Status: DC

## 2019-02-09 NOTE — Plan of Care (Signed)
Problem: Education: Goal: Knowledge of General Education information will improve Description: Including pain rating scale, medication(s)/side effects and non-pharmacologic comfort measures 02/09/2019 1215 by Rosemary Holms, Veatrice Bourbon, RN Outcome: Completed/Met 02/09/2019 1215 by Rosemary Holms, Veatrice Bourbon, RN Outcome: Adequate for Discharge   Problem: Health Behavior/Discharge Planning: Goal: Ability to manage health-related needs will improve 02/09/2019 1215 by Odessa Fleming, RN Outcome: Completed/Met 02/09/2019 1215 by Odessa Fleming, RN Outcome: Adequate for Discharge   Problem: Clinical Measurements: Goal: Ability to maintain clinical measurements within normal limits will improve 02/09/2019 1215 by Odessa Fleming, RN Outcome: Completed/Met 02/09/2019 1215 by Odessa Fleming, RN Outcome: Adequate for Discharge Goal: Will remain free from infection 02/09/2019 1215 by Odessa Fleming, RN Outcome: Completed/Met 02/09/2019 1215 by Odessa Fleming, RN Outcome: Adequate for Discharge Goal: Diagnostic test results will improve 02/09/2019 1215 by Odessa Fleming, RN Outcome: Completed/Met 02/09/2019 1215 by Odessa Fleming, RN Outcome: Adequate for Discharge Goal: Respiratory complications will improve 02/09/2019 1215 by Odessa Fleming, RN Outcome: Completed/Met 02/09/2019 1215 by Odessa Fleming, RN Outcome: Adequate for Discharge Goal: Cardiovascular complication will be avoided 02/09/2019 1215 by Odessa Fleming, RN Outcome: Completed/Met 02/09/2019 1215 by Odessa Fleming, RN Outcome: Adequate for Discharge   Problem: Activity: Goal: Risk for activity intolerance will decrease 02/09/2019 1215 by Odessa Fleming, RN Outcome: Completed/Met 02/09/2019 1215 by Rosemary Holms, Veatrice Bourbon, RN Outcome: Adequate for Discharge   Problem: Nutrition: Goal: Adequate nutrition will be maintained 02/09/2019  1215 by Odessa Fleming, RN Outcome: Completed/Met 02/09/2019 1215 by Odessa Fleming, RN Outcome: Adequate for Discharge   Problem: Coping: Goal: Level of anxiety will decrease 02/09/2019 1215 by Odessa Fleming, RN Outcome: Completed/Met 02/09/2019 1215 by Odessa Fleming, RN Outcome: Adequate for Discharge   Problem: Elimination: Goal: Will not experience complications related to bowel motility 02/09/2019 1215 by Odessa Fleming, RN Outcome: Completed/Met 02/09/2019 1215 by Odessa Fleming, RN Outcome: Adequate for Discharge Goal: Will not experience complications related to urinary retention 02/09/2019 1215 by Odessa Fleming, RN Outcome: Completed/Met 02/09/2019 1215 by Odessa Fleming, RN Outcome: Adequate for Discharge   Problem: Pain Managment: Goal: General experience of comfort will improve 02/09/2019 1215 by Odessa Fleming, RN Outcome: Completed/Met 02/09/2019 1215 by Odessa Fleming, RN Outcome: Adequate for Discharge   Problem: Safety: Goal: Ability to remain free from injury will improve 02/09/2019 1215 by Odessa Fleming, RN Outcome: Completed/Met 02/09/2019 1215 by Odessa Fleming, RN Outcome: Adequate for Discharge   Problem: Skin Integrity: Goal: Risk for impaired skin integrity will decrease 02/09/2019 1215 by Odessa Fleming, RN Outcome: Completed/Met 02/09/2019 1215 by Odessa Fleming, RN Outcome: Adequate for Discharge   Problem: Education: Goal: Knowledge of risk factors and measures for prevention of condition will improve 02/09/2019 1215 by Odessa Fleming, RN Outcome: Completed/Met 02/09/2019 1215 by Odessa Fleming, RN Outcome: Adequate for Discharge   Problem: Coping: Goal: Psychosocial and spiritual needs will be supported 02/09/2019 1215 by Odessa Fleming, RN Outcome: Completed/Met 02/09/2019 1215 by Odessa Fleming,  RN Outcome: Adequate for Discharge   Problem: Respiratory: Goal: Will maintain a patent airway 02/09/2019 1215 by Odessa Fleming, RN Outcome: Completed/Met 02/09/2019 1215 by Odessa Fleming, RN Outcome: Adequate  for Discharge Goal: Complications related to the disease process, condition or treatment will be avoided or minimized 02/09/2019 1215 by Odessa Fleming, RN Outcome: Completed/Met 02/09/2019 1215 by Rosemary Holms, Veatrice Bourbon, RN Outcome: Adequate for Discharge

## 2019-02-09 NOTE — Plan of Care (Signed)

## 2019-02-09 NOTE — TOC Transition Note (Signed)
Transition of Care [facility]) - CM/SW Discharge Note   Patient Details  Name: GIAVONA BATHGATE MRN: [id number] Date of Birth: 02/18/1971  Transition of Care [facility]) CM/SW Contact:  Ninfa Meeker, RN Phone Number: [phone] (working remotely) 02/09/2019, 12:39 PM   Clinical Narrative:   49 yr old female admitted and treated for COVID 19. Thankfully patient is improving and will discharge home today. Case manager spoke with patient via telephone to discuss discharge needs. She will go home with oxygen. Choice offered, referral called to Learta Codding, [employer] Liaison. Patient's husband, Merry Proud, will be home to receive the tanks. Case manager confirmed his number: [phone]. Case manager called [facility] and requested a tank be delivered to patient's room. Patient will discharge home once tanks have been delivered to her home.     Final next level of care: Home/Self Care Barriers to Discharge: No Barriers Identified   Patient Goals and CMS Choice     Choice offered to / list presented to : Patient  Discharge Placement                       Discharge Plan and Services In-house Referral: NA Discharge Planning Services: CM Consult            DME Arranged: Oxygen   Date DME Agency Contacted: 02/09/19 Time DME Agency Contacted: 1238 Representative spoke with at DME Agency: Learta Codding [facility] Arranged: NA          Social Determinants of Health ([place]) Interventions     Readmission Risk Interventions No flowsheet data found.

## 2019-02-09 NOTE — Discharge Instructions (Signed)

## 2019-02-09 NOTE — Progress Notes (Signed)
Patient discharged home via wheelchair to POV A/Ox4, states understanding of discharge instructions that were given along with prescriptions and personal belongings.

## 2019-02-09 NOTE — Progress Notes (Signed)
Patient ambulated 20 feet in hallway on RA until SPO2 dropped to 85% with dyspnea. Continued return walk to room on 2L/[place], SPO2 recovered to 95%.

## 2019-02-09 NOTE — Discharge Summary (Signed)
[facility]  Physician Discharge Summary   Patient ID: Nicole Santos MRN: [id number] DOB/AGE: [birth year] 48 y.o.  Admit date: 02/02/2019 Discharge date: 02/09/2019  PCP: Terald Sleeper, PA-C  DISCHARGE DIAGNOSES:  Pneumonia secondary to COVID-19 Acute respiratory failure with hypoxia, resolved Diabetes mellitus type 2, uncontrolled with hyperglycemia most likely due to steroids Essential hypertension    RECOMMENDATIONS FOR OUTPATIENT FOLLOW UP: 1. Outpatient follow-up with PCP via E visit in 1 to 2 weeks.    Home Health: None Equipment/Devices: Home oxygen  CODE STATUS: Full code  DISCHARGE CONDITION: fair  Diet recommendation: Modified carbohydrate  INITIAL HISTORY: 47 y.o.femalepast medical history of diabetes mellitus type 2, hyperlipidemia hypertension presented with fever chills and myalgias with a nonproductive cough that started 11 days prior to admission. Her daughter works at a skilled nursing facility in [place] was recently diagnosed with COVID-19 on 01/19/2019. In the ED she was found to be tachycardic satting 75% on room air placed on 5 L and improved to 93 mild elevation in ALT AST hemoglobin stable, chest x-ray showed multifocal infiltrates, her SARS-CoV-2 test was positive on 02/02/2019.  She was hospitalized for further management.   HOSPITAL COURSE:   Acute Hypoxic Resp. Failure due to Acute Covid 19 Viral Illness Patient was hospitalized with hypoxia.  She was on high flow nasal cannula.  Patient was started on steroids and Remdesivir.  She was offered Actemra which she declined.  Patient was given Lasix.  She started improving slowly.  She is now on 1 to 2 L of oxygen.  She will most likely need home oxygen.  This will be ordered.  Assessment to be done this morning with the ambulation.  She will be discharged with steroid taper.  Inflammatory markers have improved.  Diabetes mellitus type 2, uncontrolled with hyperglycemia Elevated  CBGs most likely due to steroids.  This morning she was noted to be hypoglycemic.  Not symptomatic.  CBG to be rechecked before discharge.  Due to hyperglycemia from steroid she was placed on insulin here in the hospital.  She takes only metformin at home.  As steroid is tapered down her blood glucose levels should improve.  She will be given a prescription for glucometer to check her glucose levels at home.  HbA1c was 6.4.    Essential hypertension Trolled.  Continue with home medications.    Hyperlipidemia Continue statin  Positive urine cultures Patient denies any urinary symptoms.  WBC is normal.  Procalcitonin was normal.    She was not treated with antibiotics.  Bacteremia 1 out of 4 blood culture sets positive for gram-positive bacteria.  Micrococcus luteus identified.  This is usually found on skin.  This is likely a contaminant.    Other sets of cultures were negative.  Morbid obesity Estimated body mass index is 48.47 kg/m as calculated from the following:   Height as of this encounter: 5' 2"  (1.575 m).   Weight as of this encounter: 120.2 kg.   Overall stable.  Okay for discharge home today.  PERTINENT LABS:  The results of significant diagnostics from this hospitalization (including imaging, microbiology, ancillary and laboratory) are listed below for reference.    Microbiology: Recent Results (from the past 240 hour(s))  Urine culture     Status: Abnormal   Collection Time: 02/02/19 12:00 PM   Specimen: In/Out Cath Urine  Result Value Ref Range Status   Specimen Description   Final    IN/OUT CATH URINE Performed at [facility], [address] [place], [place] [address]    Special Requests   Final    NONE Performed at [facility], [address], [place],  [address]    Culture >=100,000 COLONIES/mL KLEBSIELLA PNEUMONIAE (A)  Final   Report Status 02/04/2019 FINAL  Final   Organism ID, Bacteria KLEBSIELLA PNEUMONIAE (A)  Final      Susceptibility    Klebsiella pneumoniae - MIC*    AMPICILLIN RESISTANT Resistant     CEFAZOLIN <=4 SENSITIVE Sensitive     CEFTRIAXONE <=1 SENSITIVE Sensitive     CIPROFLOXACIN <=0.25 SENSITIVE Sensitive     GENTAMICIN <=1 SENSITIVE Sensitive     IMIPENEM <=0.25 SENSITIVE Sensitive     NITROFURANTOIN 64 INTERMEDIATE Intermediate     TRIMETH/SULFA <=20 SENSITIVE Sensitive     AMPICILLIN/SULBACTAM <=2 SENSITIVE Sensitive     PIP/TAZO <=4 SENSITIVE Sensitive     Extended ESBL NEGATIVE Sensitive     * >=100,000 COLONIES/mL KLEBSIELLA PNEUMONIAE  SARS Coronavirus 2 [facility] order, Performed in [facility] hospital lab) Nasopharyngeal Nasopharyngeal Swab     Status: Abnormal   Collection Time: 02/02/19 12:01 PM   Specimen: Nasopharyngeal Swab  Result Value Ref Range Status   SARS Coronavirus 2 POSITIVE (A) NEGATIVE Final    Comment: CALLED TO HEATHER CRAWFORD AT 1322 BY HFLYNT 02/02/19 (NOTE) If result is NEGATIVE SARS-CoV-2 target nucleic acids are NOT DETECTED. The SARS-CoV-2 RNA is generally detectable in upper and lower  respiratory specimens during the acute phase of infection. The lowest  concentration of SARS-CoV-2 viral copies this assay can detect is 250  copies / mL. A negative result does not preclude SARS-CoV-2 infection  and should not be used as the sole basis for treatment or other  patient management decisions.  A negative result may occur with  improper specimen collection / handling, submission of specimen other  than nasopharyngeal swab, presence of viral mutation(s) within the  areas targeted by this assay, and inadequate number of viral copies  (<250 copies / mL). A negative result must be combined with clinical  observations, patient history, and epidemiological information. If result is POSITIVE SARS-CoV-2 target nucleic acids are DETECTED. The SARS-CoV-2 RNA is generally de tectable in upper and lower  respiratory specimens during the acute phase of infection.  Positive   results are indicative of active infection with SARS-CoV-2.  Clinical  correlation with patient history and other diagnostic information is  necessary to determine patient infection status.  Positive results do  not rule out bacterial infection or co-infection with other viruses. If result is PRESUMPTIVE POSTIVE SARS-CoV-2 nucleic acids MAY BE PRESENT.   A presumptive positive result was obtained on the submitted specimen  and confirmed on repeat testing.  While 2019 novel coronavirus  (SARS-CoV-2) nucleic acids may be present in the submitted sample  additional confirmatory testing may be necessary for epidemiological  and / or clinical management purposes  to differentiate between  SARS-CoV-2 and other Sarbecovirus currently known to infect humans.  If clinically indicated additional testing with an alternate test  methodology [phone]) is advised. The SARS-CoV-2 RNA is g enerally  detectable in upper and lower respiratory specimens during the acute  phase of infection. The expected result is Negative. Fact Sheet for Patients:  StrictlyIdeas.no Fact Sheet for Healthcare Providers: BankingDealers.co.za This test is not yet approved or cleared by the [place] FDA and has been authorized for detection and/or diagnosis of SARS-CoV-2 by FDA under an Emergency Use Authorization (EUA).  This EUA  will remain in effect (meaning this test can be used) for the duration of the COVID-19 declaration under Section 564(b)(1) of the Act, 21 U.S.C. section 360bbb-3(b)(1), unless the authorization is terminated or revoked sooner. Performed at [facility], [address], [place], [place] [address]   Blood Culture (routine x 2)     Status: Abnormal   Collection Time: 02/02/19 12:05 PM   Specimen: BLOOD  Result Value Ref Range Status   Specimen Description   Final    BLOOD RIGHT ANTECUBITAL Performed at [facility], [address]  [place], [place] [address]    Special Requests   Final    BOTTLES DRAWN AEROBIC AND ANAEROBIC Blood Culture adequate volume Performed at [facility], [address] [place], [place] [address]    Culture  Setup Time   Final    AEROBIC BOTTLE ONLY GRAM POSITIVE COCCI Gram Stain Report Called to,Read Back By and Verified With: CLAIRE,RN @0535  02/04/19 MKELLY    Culture (A)  Final    MICROCOCCUS LUTEUS/LYLAE Standardized susceptibility testing for this organism is not available. Performed at [facility], [place] [address], [place], [place] [address]    Report Status 02/06/2019 FINAL  Final  Blood Culture ID Panel (Reflexed)     Status: None   Collection Time: 02/02/19 12:05 PM  Result Value Ref Range Status   Enterococcus species NOT DETECTED NOT DETECTED Final   Listeria monocytogenes NOT DETECTED NOT DETECTED Final   Staphylococcus species NOT DETECTED NOT DETECTED Final   Staphylococcus aureus (BCID) NOT DETECTED NOT DETECTED Final   Streptococcus species NOT DETECTED NOT DETECTED Final   Streptococcus agalactiae NOT DETECTED NOT DETECTED Final   Streptococcus pneumoniae NOT DETECTED NOT DETECTED Final   Streptococcus pyogenes NOT DETECTED NOT DETECTED Final   Acinetobacter baumannii NOT DETECTED NOT DETECTED Final   Enterobacteriaceae species NOT DETECTED NOT DETECTED Final   Enterobacter cloacae complex NOT DETECTED NOT DETECTED Final   Escherichia coli NOT DETECTED NOT DETECTED Final   Klebsiella oxytoca NOT DETECTED NOT DETECTED Final   Klebsiella pneumoniae NOT DETECTED NOT DETECTED Final   Proteus species NOT DETECTED NOT DETECTED Final   Serratia marcescens NOT DETECTED NOT DETECTED Final   Haemophilus influenzae NOT DETECTED NOT DETECTED Final   Neisseria meningitidis NOT DETECTED NOT DETECTED Final   Pseudomonas aeruginosa NOT DETECTED NOT DETECTED Final   Candida albicans NOT DETECTED NOT DETECTED Final   Candida glabrata NOT DETECTED NOT DETECTED Final   Candida  krusei NOT DETECTED NOT DETECTED Final   Candida parapsilosis NOT DETECTED NOT DETECTED Final   Candida tropicalis NOT DETECTED NOT DETECTED Final    Comment: Performed at [facility], [place] [address], [place], [place] [address]  Blood Culture (routine x 2)     Status: None   Collection Time: 02/02/19 12:55 PM   Specimen: BLOOD  Result Value Ref Range Status   Specimen Description BLOOD LEFT ANTECUBITAL  Final   Special Requests   Final    BOTTLES DRAWN AEROBIC AND ANAEROBIC Blood Culture adequate volume   Culture   Final    NO GROWTH 5 DAYS Performed at [facility], [address], [place], [place] [address]    Report Status 02/07/2019 FINAL  Final     Labs: Basic Metabolic Panel: Recent Labs  Lab 02/03/19 0500 02/04/19 0410 02/05/19 0407 02/06/19 0312 02/07/19 0354 02/08/19 0251  NA  --  143 144 145 141 142  K  --  3.7 3.5  3.9 4.0 4.1  CL  --  105 107 109 103 102  CO2  --  25 27 27 28 28   GLUCOSE  --  140* 144* 153* 206* 188*  BUN  --  16 19 21* 23* 23*  CREATININE  --  0.64 0.62 0.65 0.78 0.62  CALCIUM  --  8.3* 7.9* 7.9* 8.2* 8.3*  MG 2.2 2.5* 2.6* 2.6* 2.6*  --    Liver Function Tests: Recent Labs  Lab 02/02/19 1200 02/04/19 0410 02/05/19 0407 02/06/19 0312 02/07/19 0354  AST 49* 24 17 18 15   ALT 58* 37 28 26 25   ALKPHOS 42 40 37* 35* 39  BILITOT 0.6 0.4 0.7 0.8 0.9  PROT 7.1 6.6 6.1* 6.3* 6.9  ALBUMIN 3.4* 3.0* 3.0* 3.0* 3.3*   CBC: Recent Labs  Lab 02/03/19 0500 02/04/19 0410 02/05/19 0407 02/06/19 0312 02/07/19 0354  WBC 4.7 5.9 6.7 6.0 7.8  NEUTROABS 3.9 4.7 5.3 4.4 6.3  HGB 12.9 13.3 12.7 13.2 14.3  HCT 39.5 40.6 39.7 40.5 44.0  MCV 91.0 90.8 91.1 90.8 90.0  PLT 217 295 324 319 361    CBG: Recent Labs  Lab 02/07/19 1602 02/07/19 2108 02/08/19 1127 02/08/19 1632 02/09/19 0801  GLUCAP 190* 130* 144* 162* 59*     IMAGING STUDIES Dg Chest Port 1 View  Result Date: 02/02/2019 CLINICAL DATA:  Shortness of breath, fever  EXAM: PORTABLE CHEST 1 VIEW COMPARISON:  None. FINDINGS: Cardiomegaly. Lung volumes are low. Multifocal bilateral airspace consolidations including dense airspace opacity within the right upper lobe. No large pleural fluid collection. No pneumothorax. IMPRESSION: Multifocal bilateral airspace consolidations suspicious for atypical pneumonia. Electronically Signed   By: Davina Poke M.D.   On: 02/02/2019 12:27    DISCHARGE EXAMINATION: Vitals:   02/09/19 0100 02/09/19 0200 02/09/19 0400 02/09/19 0803  BP:   115/64 (!) 112/58  Pulse: 65 60 66 75  Resp:   20 17  Temp:   98 F (36.7 C) 97.8 F (36.6 C)  TempSrc:   Oral Oral  SpO2: 91% 92% 96% 93%  Weight:      Height:       General appearance: Awake alert.  In no distress.  Morbidly obese Resp: Normal effort at rest.  Improved aeration.  No crackles heard today.  No wheezing or rhonchi.   Cardio: S1-S2 is normal regular.  No S3-S4.  No rubs murmurs or bruit GI: Abdomen is soft.  Nontender nondistended.  Bowel sounds are present normal.  No masses organomegaly Extremities: No edema.  Full range of motion of lower extremities. Neurologic: Alert and oriented x3.  No focal neurological deficits.    DISPOSITION: Home  Discharge Instructions    Call MD for:  difficulty breathing, headache or visual disturbances   Complete by: As directed    Call MD for:  extreme fatigue   Complete by: As directed    Call MD for:  persistant dizziness or light-headedness   Complete by: As directed    Call MD for:  persistant nausea and vomiting   Complete by: As directed    Call MD for:  severe uncontrolled pain   Complete by: As directed    Call MD for:  temperature >100.4   Complete by: As directed    Diet Carb Modified   Complete by: As directed    Discharge instructions   Complete by: As directed    COVID 19 INSTRUCTIONS  - You are felt to be stable enough to  no longer require inpatient monitoring, testing, and treatment, though you will  need to follow the recommendations below: - Based on the CDC's non-test criteria for ending self-isolation: You may not return to work/leave the home until at least 21 days since symptom onset AND 3 days without a fever (without taking tylenol, ibuprofen, etc.) AND have improvement in respiratory symptoms. - Do not take NSAID medications (including, but not limited to, ibuprofen, advil, motrin, naproxen, aleve, goody's powder, etc.) - Follow up with your doctor in the next week via telehealth or seek medical attention right away if your symptoms get WORSE.  - Consider donating plasma after you have recovered (either 14 days after a negative test or 28 days after symptoms have completely resolved) because your antibodies to this virus may be helpful to give to others with life-threatening infections. Please go to the website www.[email or web address] if you would like to consider volunteering for plasma donation.    Directions for you at home:  Wear a facemask You should wear a facemask that covers your nose and mouth when you are in the same room with other people and when you visit a healthcare provider. People who live with or visit you should also wear a facemask while they are in the same room with you.  Separate yourself from other people in your home As much as possible, you should stay in a different room from other people in your home. Also, you should use a separate bathroom, if available.  Avoid sharing household items You should not share dishes, drinking glasses, cups, eating utensils, towels, bedding, or other items with other people in your home. After using these items, you should wash them thoroughly with soap and water.  Cover your coughs and sneezes Cover your mouth and nose with a tissue when you cough or sneeze, or you can cough or sneeze into your sleeve. Throw used tissues in a lined trash can, and immediately wash your hands with soap and water for at least 20 seconds or use an  alcohol-based hand rub.  Wash your [facility] your hands often and thoroughly with soap and water for at least 20 seconds. You can use an alcohol-based hand sanitizer if soap and water are not available and if your hands are not visibly dirty. Avoid touching your eyes, nose, and mouth with unwashed hands.  Directions for those who live with, or provide care at home for you:  Limit the number of people who have contact with the patient If possible, have only one caregiver for the patient. Other household members should stay in another home or place of residence. If this is not possible, they should stay in another room, or be separated from the patient as much as possible. Use a separate bathroom, if available. Restrict visitors who do not have an essential need to be in the home.  Ensure good ventilation Make sure that shared spaces in the home have good air flow, such as from an air conditioner or an opened window, weather permitting.  Wash your hands often Wash your hands often and thoroughly with soap and water for at least 20 seconds. You can use an alcohol based hand sanitizer if soap and water are not available and if your hands are not visibly dirty. Avoid touching your eyes, nose, and mouth with unwashed hands. Use disposable paper towels to dry your hands. If not available, use dedicated cloth towels and replace them when they become wet.  Wear a facemask and gloves Wear  a disposable facemask at all times in the room and gloves when you touch or have contact with the patient's blood, body fluids, and/or secretions or excretions, such as sweat, saliva, sputum, nasal mucus, vomit, urine, or feces.  Ensure the mask fits over your nose and mouth tightly, and do not touch it during use. Throw out disposable facemasks and gloves after using them. Do not reuse. Wash your hands immediately after removing your facemask and gloves. If your personal clothing becomes contaminated, carefully  remove clothing and launder. Wash your hands after handling contaminated clothing. Place all used disposable facemasks, gloves, and other waste in a lined container before disposing them with other household waste. Remove gloves and wash your hands immediately after handling these items.  Do not share dishes, glasses, or other household items with the patient Avoid sharing household items. You should not share dishes, drinking glasses, cups, eating utensils, towels, bedding, or other items with a patient who is confirmed to have, or being evaluated for, COVID-19 infection. After the person uses these items, you should wash them thoroughly with soap and water.  Wash laundry thoroughly Immediately remove and wash clothes or bedding that have blood, body fluids, and/or secretions or excretions, such as sweat, saliva, sputum, nasal mucus, vomit, urine, or feces, on them. Wear gloves when handling laundry from the patient. Read and follow directions on labels of laundry or clothing items and detergent. In general, wash and dry with the warmest temperatures recommended on the label.  Clean all areas the individual has used often Clean all touchable surfaces, such as counters, tabletops, doorknobs, bathroom fixtures, toilets, phones, keyboards, tablets, and bedside tables, every day. Also, clean any surfaces that may have blood, body fluids, and/or secretions or excretions on them. Wear gloves when cleaning surfaces the patient has come in contact with. Use a diluted bleach solution (e.g., dilute bleach with 1 part bleach and 10 parts water) or a household disinfectant with a label that says EPA-registered for coronaviruses. To make a bleach solution at home, add 1 tablespoon of bleach to 1 quart (4 cups) of water. For a larger supply, add  cup of bleach to 1 gallon (16 cups) of water. Read labels of cleaning products and follow recommendations provided on product labels. Labels contain instructions for  safe and effective use of the cleaning product including precautions you should take when applying the product, such as wearing gloves or eye protection and making sure you have good ventilation during use of the product. Remove gloves and wash hands immediately after cleaning.  Monitor yourself for signs and symptoms of illness Caregivers and household members are considered close contacts, should monitor their health, and will be asked to limit movement outside of the home to the extent possible. Follow the monitoring steps for close contacts listed on the symptom monitoring form.   If you have additional questions, contact your local health department or call the epidemiologist on call at [phone] (available 24/7). This guidance is subject to change. For the most up-to-date guidance from [facility], please refer to their website: YouBlogs.pl   You were cared for by a hospitalist during your hospital stay. If you have any questions about your discharge medications or the care you received while you were in the hospital after you are discharged, you can call the unit and asked to speak with the hospitalist on call if the hospitalist that took care of you is not available. Once you are discharged, your primary care physician will handle any  further medical issues. Please note that NO REFILLS for any discharge medications will be authorized once you are discharged, as it is imperative that you return to your primary care physician (or establish a relationship with a primary care physician if you do not have one) for your aftercare needs so that they can reassess your need for medications and monitor your lab values. If you do not have a primary care physician, you can call [phone] for a physician referral.   Increase activity slowly   Complete by: As directed    MyChart COVID-19 home monitoring program   Complete by: Feb 09, 2019    Is  the patient willing to use the [employer] for home monitoring?: Yes   Temperature monitoring   Complete by: Feb 09, 2019    After how many days would you like to receive a notification of this patient's flowsheet entries?: 1        Allergies as of 02/09/2019      Reactions   Augmentin [amoxicillin-pot Clavulanate] Diarrhea, Nausea And Vomiting   Doxycycline    Abdominal cramps.      Medication List    TAKE these medications   aspirin EC 81 MG tablet Take 81 mg by mouth daily.   atorvastatin 20 MG tablet Commonly known as: LIPITOR Take 1 tablet (20 mg total) by mouth daily.   blood glucose meter kit and supplies Kit Dispense based on patient and insurance preference. Use up to four times daily as directed. (FOR ICD-9 250.00, 250.01).   cimetidine 200 MG tablet Commonly known as: TAGAMET Take 200 mg by mouth 2 (two) times daily.   ibuprofen 200 MG tablet Commonly known as: ADVIL Take 200 mg by mouth every 6 (six) hours as needed for mild pain or moderate pain.   lisinopril 10 MG tablet Commonly known as: ZESTRIL TAKE 1 TABLET BY MOUTH DAILY (NEEDS TO BE SEEN) What changed:   how much to take  how to take this  when to take this  additional instructions   metFORMIN 500 MG tablet Commonly known as: GLUCOPHAGE Take 1 tablet (500 mg total) by mouth daily.   predniSONE 20 MG tablet Commonly known as: DELTASONE Please take 2 tablets once daily for 3 days followed by 1 tablet once daily for 3 days and then stop            Durable Medical Equipment  (From admission, onward)         Start     Ordered   02/09/19 1007  For home use only DME oxygen  Once    Question Answer Comment  Length of Need 6 Months   Mode or (Route) Nasal cannula   Liters per Minute 2   Frequency Continuous (stationary and portable oxygen unit needed)   Oxygen conserving device Yes   Oxygen delivery system Gas      02/09/19 1007           Follow-up Information     Terald Sleeper, PA-C. Schedule an appointment as soon as possible for a visit in 1 week(s).   Specialties: Physician Assistant, Family Medicine Contact information: [place] [address] [phone]           TOTAL DISCHARGE TIME: 43 minutes  [place]  [facility] Pager on [email or web address]  02/09/2019, 10:38 AM

## 2019-02-10 LAB — GLUCOSE, CAPILLARY
Glucose-Capillary: 149 mg/dL — ABNORMAL HIGH (ref 70–99)
Glucose-Capillary: 157 mg/dL — ABNORMAL HIGH (ref 70–99)
Glucose-Capillary: 202 mg/dL — ABNORMAL HIGH (ref 70–99)

## 2019-02-18 ENCOUNTER — Encounter: Payer: Self-pay | Admitting: Physician Assistant

## 2019-02-18 ENCOUNTER — Ambulatory Visit (INDEPENDENT_AMBULATORY_CARE_PROVIDER_SITE_OTHER): Payer: Commercial Managed Care - PPO | Admitting: Physician Assistant

## 2019-02-18 DIAGNOSIS — J189 Pneumonia, unspecified organism: Secondary | ICD-10-CM

## 2019-02-18 DIAGNOSIS — J9601 Acute respiratory failure with hypoxia: Secondary | ICD-10-CM

## 2019-02-18 DIAGNOSIS — U071 COVID-19: Secondary | ICD-10-CM | POA: Diagnosis not present

## 2019-02-18 DIAGNOSIS — J181 Lobar pneumonia, unspecified organism: Secondary | ICD-10-CM | POA: Diagnosis not present

## 2019-02-18 DIAGNOSIS — I10 Essential (primary) hypertension: Secondary | ICD-10-CM

## 2019-02-18 DIAGNOSIS — J069 Acute upper respiratory infection, unspecified: Secondary | ICD-10-CM

## 2019-02-18 MED ORDER — LISINOPRIL 2.5 MG PO TABS: 2.5000 mg | ORAL_TABLET | Freq: Every day | ORAL | 1 refills | Status: DC

## 2019-02-18 NOTE — Progress Notes (Signed)
Telephone visit  Subjective: CC: Recheck hospitalization after COVID-19 and pneumonia admission PCP: Terald Sleeper, PA-C HPI:Nicole Santos is a 48 y.o. female calls for telephone consult today. Patient provides verbal consent for consult held via phone.  Patient is identified with 2 separate identifiers.  At this time the entire area is on COVID-19 social distancing and stay home orders are in place.  Patient is of higher risk and therefore we are performing this by a virtual method.  Location of patient: Home Location of provider: HOME Others present for call: No  Patient reports that around August 20 she started having symptoms and she had this for about 11 days before she went to the hospital and was admitted at [facility] for COVID-19 positive test and pneumonia.  She was there for about 1 week.  She was discharged on 02/09/2019.  She states that she was only sent home with some prednisone.  I reviewed her admission notes and discharge summary.  She states the thing that is most bothersome now is that she still has just generalized feeling numbness, weakness, she will have more numbness and and tingling in the finger and hand on the side where she had an IV blow out.  She states it is starting to heal better.  She states that she is just extremely tired and has a hard time getting up and doing anything.  She is using her spirometer.  She is having some hypotension she is currently on lisinopril 10 mg.  She had held it over the weekend when her blood pressure got very low.  She has lost about 30 pounds this past month due to her illness.  At this time I would like for her to lower her lisinopril to 2.5 mg/day and she can go up to 5 mg if the blood pressure goes over 120/80 and states that high.  A new prescription has been sent to the pharmacy.  I encouraged her to continue to eat well and drink and do her spirometer and her endurance hopefully in another month or so should be improved.    ROS: Per HPI  Allergies  Allergen Reactions  . Augmentin [Amoxicillin-Pot Clavulanate] Diarrhea and Nausea And Vomiting  . Doxycycline     Abdominal cramps.   Past Medical History:  Diagnosis Date  . Cancer ([place])   . Hypertension     Current Outpatient Medications:  .  aspirin EC 81 MG tablet, Take 81 mg by mouth daily., Disp: , Rfl:  .  atorvastatin (LIPITOR) 20 MG tablet, Take 1 tablet (20 mg total) by mouth daily., Disp: 90 tablet, Rfl: 3 .  blood glucose meter kit and supplies KIT, Dispense based on patient and insurance preference. Use up to four times daily as directed. (FOR ICD-9 250.00, 250.01)., Disp: 1 each, Rfl: 0 .  cimetidine (TAGAMET) 200 MG tablet, Take 200 mg by mouth 2 (two) times daily., Disp: , Rfl:  .  ibuprofen (ADVIL) 200 MG tablet, Take 200 mg by mouth every 6 (six) hours as needed for mild pain or moderate pain., Disp: , Rfl:  .  lisinopril (ZESTRIL) 2.5 MG tablet, Take 1-2 tablets (2.5-5 mg total) by mouth daily., Disp: 180 tablet, Rfl: 1 .  metFORMIN (GLUCOPHAGE) 500 MG tablet, Take 1 tablet (500 mg total) by mouth daily., Disp: 90 tablet, Rfl: 3 .  predniSONE (DELTASONE) 20 MG tablet, Please take 2 tablets once daily for 3 days followed by 1 tablet once daily for 3  days and then stop, Disp: 9 tablet, Rfl: 0  Assessment/ Plan: 48 y.o. female   1. COVID-19 virus infection Continue supportive care Use spirometer  2. Acute respiratory disease due to COVID-19 virus RESOLVED Continue continue supportive care The spirometer  3. Acute respiratory failure with hypoxia (HCC) Continue supportive care Use spirometer  4. Community acquired pneumonia of left lower lobe of lung ([place]) Continue supportive care Use spirometer  5. Essential hypertension - lisinopril (ZESTRIL) 2.5 MG tablet; Take 1-2 tablets (2.5-5 mg total) by mouth daily.  Dispense: 180 tablet; Refill: 1   Return in about 6 weeks (around 04/01/2019).  Continue all other maintenance  medications as listed above.  Start time: 10:55 AM End time: 11:10 AM  Meds ordered this encounter  Medications  . lisinopril (ZESTRIL) 2.5 MG tablet    Sig: Take 1-2 tablets (2.5-5 mg total) by mouth daily.    Dispense:  180 tablet    Refill:  1    Order Specific Question:   Supervising Provider    Answer:   Janora Norlander [[id number]]    Particia Nearing PA-C [place] [phone]

## 2019-02-24 ENCOUNTER — Ambulatory Visit (INDEPENDENT_AMBULATORY_CARE_PROVIDER_SITE_OTHER): Payer: Commercial Managed Care - PPO | Admitting: Physician Assistant

## 2019-02-24 ENCOUNTER — Encounter: Payer: Self-pay | Admitting: Physician Assistant

## 2019-02-24 DIAGNOSIS — U071 COVID-19: Secondary | ICD-10-CM

## 2019-02-24 DIAGNOSIS — N3001 Acute cystitis with hematuria: Secondary | ICD-10-CM

## 2019-02-24 MED ORDER — SULFAMETHOXAZOLE-TRIMETHOPRIM 800-160 MG PO TABS: 1.0000 | ORAL_TABLET | Freq: Two times a day (BID) | ORAL | 0 refills | Status: DC

## 2019-02-24 NOTE — Progress Notes (Signed)
     Telephone visit  Subjective: CC:uti PCP: Terald Sleeper, PA-C HPI:Nicole Santos is a 48 y.o. female calls for telephone consult today. Patient provides verbal consent for consult held via phone.  Patient is identified with 2 separate identifiers.  At this time the entire area is on COVID-19 social distancing and stay home orders are in place.  Patient is of higher risk and therefore we are performing this by a virtual method.  Location of patient: home Location of provider: [facility] Others present for call: no  This patient has had several days of dysuria, frequency and nocturia. There is also pain over the bladder in the suprapubic region, no back pain. Denies leakage or hematuria.  Denies fever or chills. No pain in flank area.   ROS: Per HPI  Allergies  Allergen Reactions  . Augmentin [Amoxicillin-Pot Clavulanate] Diarrhea and Nausea And Vomiting  . Doxycycline     Abdominal cramps.   Past Medical History:  Diagnosis Date  . Acute respiratory disease due to COVID-19 virus 02/02/2019  . Cancer ([place])   . Hypertension     Current Outpatient Medications:  .  aspirin EC 81 MG tablet, Take 81 mg by mouth daily., Disp: , Rfl:  .  atorvastatin (LIPITOR) 20 MG tablet, Take 1 tablet (20 mg total) by mouth daily., Disp: 90 tablet, Rfl: 3 .  blood glucose meter kit and supplies KIT, Dispense based on patient and insurance preference. Use up to four times daily as directed. (FOR ICD-9 250.00, 250.01)., Disp: 1 each, Rfl: 0 .  cimetidine (TAGAMET) 200 MG tablet, Take 200 mg by mouth 2 (two) times daily., Disp: , Rfl:  .  ibuprofen (ADVIL) 200 MG tablet, Take 200 mg by mouth every 6 (six) hours as needed for mild pain or moderate pain., Disp: , Rfl:  .  lisinopril (ZESTRIL) 2.5 MG tablet, Take 1-2 tablets (2.5-5 mg total) by mouth daily., Disp: 180 tablet, Rfl: 1 .  metFORMIN (GLUCOPHAGE) 500 MG tablet, Take 1 tablet (500 mg total) by mouth daily., Disp: 90 tablet, Rfl: 3 .  predniSONE  (DELTASONE) 20 MG tablet, Please take 2 tablets once daily for 3 days followed by 1 tablet once daily for 3 days and then stop, Disp: 9 tablet, Rfl: 0 .  sulfamethoxazole-trimethoprim (BACTRIM DS) 800-160 MG tablet, Take 1 tablet by mouth 2 (two) times daily., Disp: 20 tablet, Rfl: 0  Assessment/ Plan: 48 y.o. female   1. Acute cystitis with hematuria - sulfamethoxazole-trimethoprim (BACTRIM DS) 800-160 MG tablet; Take 1 tablet by mouth 2 (two) times daily.  Dispense: 20 tablet; Refill: 0  2. COVID-19 virus infection Recovering Spirometer O2 at night   No follow-ups on file.  Continue all other maintenance medications as listed above.  Start time: 2:01 PM End time: 2:12 PM  Meds ordered this encounter  Medications  . sulfamethoxazole-trimethoprim (BACTRIM DS) 800-160 MG tablet    Sig: Take 1 tablet by mouth 2 (two) times daily.    Dispense:  20 tablet    Refill:  0    Order Specific Question:   Supervising Provider    Answer:   Janora Norlander [[id number]]    Particia Nearing PA-C [place] [phone]

## 2019-03-17 ENCOUNTER — Other Ambulatory Visit: Payer: Self-pay

## 2019-04-02 ENCOUNTER — Other Ambulatory Visit: Payer: Self-pay

## 2019-04-03 ENCOUNTER — Encounter: Payer: Self-pay | Admitting: Physician Assistant

## 2019-04-03 ENCOUNTER — Ambulatory Visit: Payer: Commercial Managed Care - PPO | Admitting: Physician Assistant

## 2019-04-03 VITALS — BP 107/73 | HR 97 | Temp 96.8°F | Ht 62.0 in | Wt 261.6 lb

## 2019-04-03 DIAGNOSIS — I10 Essential (primary) hypertension: Secondary | ICD-10-CM | POA: Diagnosis not present

## 2019-04-03 DIAGNOSIS — E785 Hyperlipidemia, unspecified: Secondary | ICD-10-CM

## 2019-04-03 DIAGNOSIS — E119 Type 2 diabetes mellitus without complications: Secondary | ICD-10-CM | POA: Diagnosis not present

## 2019-04-03 DIAGNOSIS — E1169 Type 2 diabetes mellitus with other specified complication: Secondary | ICD-10-CM | POA: Diagnosis not present

## 2019-04-03 MED ORDER — ATORVASTATIN CALCIUM 20 MG PO TABS: 20.0000 mg | ORAL_TABLET | Freq: Every day | ORAL | 3 refills | Status: DC

## 2019-04-03 MED ORDER — METFORMIN HCL 500 MG PO TABS: 500.0000 mg | ORAL_TABLET | Freq: Every day | ORAL | 3 refills | Status: DC

## 2019-04-03 MED ORDER — LISINOPRIL 2.5 MG PO TABS: 2.5000 mg | ORAL_TABLET | Freq: Every day | ORAL | 1 refills | Status: DC

## 2019-04-06 NOTE — Progress Notes (Signed)
BP 107/73   Pulse 97   Temp (!) 96.8 F (36 C) (Temporal)   Ht _0  (1.575 m)   Wt 261 lb 9.6 oz (118.7 kg)   SpO2 97%   BMI 47.85 kg/m    Subjective:    Patient ID: Nicole Santos, female    DOB: [birth year], 48 y.o.   MRN: [id number]  HPI: Nicole Santos is a 48 y.o. female presenting on 04/03/2019 for No chief complaint on file.  This patient is here for recheck on her chronic medical conditions that do include hypertension, diabetes, hyperlipidemia.  The patient also had Covid infection and was hospitalized.  She reports that she is starting to feel a lot better and having much more energy.  She is able to get most of her things and now without having to sit and rest.  Her hemoglobin A1c in August was 6.4 that is very good for her.  This was a hospital result.  She will refill her medications.  She states that she does need to have some labs performed and is a refill sent in.  Past Medical History:  Diagnosis Date  . Acute respiratory disease due to COVID-19 virus 02/02/2019  . Cancer ([place])   . Diabetes mellitus without complication ([place])   . Hypertension    Relevant past medical, surgical, family and social history reviewed and updated as indicated. Interim medical history since our last visit reviewed. Allergies and medications reviewed and updated. DATA REVIEWED: CHART IN EPIC  Family History reviewed for pertinent findings.  Review of Systems  Constitutional: Negative.  Negative for activity change, fatigue and fever.  HENT: Negative.   Eyes: Negative.   Respiratory: Negative.  Negative for cough.   Cardiovascular: Negative.  Negative for chest pain.  Gastrointestinal: Negative.  Negative for abdominal pain.  Endocrine: Negative.   Genitourinary: Negative.  Negative for dysuria.  Musculoskeletal: Negative.   Skin: Negative.   Neurological: Negative.     Allergies as of 04/03/2019      Reactions   Augmentin [amoxicillin-pot Clavulanate] Diarrhea, Nausea And  Vomiting   Doxycycline    Abdominal cramps.      Medication List       Accurate as of April 03, 2019 11:59 PM. If you have any questions, ask your nurse or doctor.        STOP taking these medications   predniSONE 20 MG tablet Commonly known as: DELTASONE Stopped by: Terald Sleeper, PA-C   sulfamethoxazole-trimethoprim 800-160 MG tablet Commonly known as: Bactrim DS Stopped by: Terald Sleeper, PA-C     TAKE these medications   aspirin EC 81 MG tablet Take 81 mg by mouth daily.   atorvastatin 20 MG tablet Commonly known as: LIPITOR Take 1 tablet (20 mg total) by mouth daily.   blood glucose meter kit and supplies Kit Dispense based on patient and insurance preference. Use up to four times daily as directed. (FOR ICD-9 250.00, 250.01).   cimetidine 200 MG tablet Commonly known as: TAGAMET Take 200 mg by mouth 2 (two) times daily.   ibuprofen 200 MG tablet Commonly known as: ADVIL Take 200 mg by mouth every 6 (six) hours as needed for mild pain or moderate pain.   lisinopril 2.5 MG tablet Commonly known as: ZESTRIL Take 1-2 tablets (2.5-5 mg total) by mouth daily.   metFORMIN 500 MG tablet Commonly known as: GLUCOPHAGE Take 1 tablet (500 mg total) by mouth daily.  Objective:    BP 107/73   Pulse 97   Temp (!) 96.8 F (36 C) (Temporal)   Ht _0  (1.575 m)   Wt 261 lb 9.6 oz (118.7 kg)   SpO2 97%   BMI 47.85 kg/m   Allergies  Allergen Reactions  . Augmentin [Amoxicillin-Pot Clavulanate] Diarrhea and Nausea And Vomiting  . Doxycycline     Abdominal cramps.    Wt Readings from Last 3 Encounters:  04/03/19 261 lb 9.6 oz (118.7 kg)  02/02/19 265 lb (120.2 kg)  08/22/18 275 lb 9.6 oz (125 kg)    Physical Exam Constitutional:      General: She is not in acute distress.    Appearance: Normal appearance. She is well-developed.  HENT:     Head: Normocephalic and atraumatic.  Cardiovascular:     Rate and Rhythm: Normal rate.  Pulmonary:      Effort: Pulmonary effort is normal.  Skin:    General: Skin is warm and dry.     Findings: No rash.  Neurological:     Mental Status: She is alert and oriented to person, place, and time.     Deep Tendon Reflexes: Reflexes are normal and symmetric.    Diabetic Foot Exam - Simple   Simple Foot Form Diabetic Foot exam was performed with the following findings: Yes 04/03/2019  1:25 PM  Visual Inspection No deformities, no ulcerations, no other skin breakdown bilaterally: Yes Sensation Testing Intact to touch and monofilament testing bilaterally: Yes Pulse Check Posterior Tibialis and Dorsalis pulse intact bilaterally: Yes Comments          Assessment & Plan:   1. Hyperlipidemia associated with type 2 diabetes mellitus (HCC) - atorvastatin (LIPITOR) 20 MG tablet; Take 1 tablet (20 mg total) by mouth daily.  Dispense: 90 tablet; Refill: 3  2. Essential hypertension - lisinopril (ZESTRIL) 2.5 MG tablet; Take 1-2 tablets (2.5-5 mg total) by mouth daily.  Dispense: 180 tablet; Refill: 1  3. Diabetes mellitus without complication (HCC) - metFORMIN (GLUCOPHAGE) 500 MG tablet; Take 1 tablet (500 mg total) by mouth daily.  Dispense: 90 tablet; Refill: 3   Continue all other maintenance medications as listed above.  Follow up plan: Return in about 6 months (around 10/02/2019) for recheck medications and labs.  Educational handout given for carb counting  Terald Sleeper PA-C [place] [address]  [place], [place] [address] [phone]   04/06/2019, 1:23 PM

## 2019-07-06 ENCOUNTER — Other Ambulatory Visit: Payer: Self-pay

## 2019-07-06 ENCOUNTER — Ambulatory Visit: Payer: Commercial Managed Care - PPO | Admitting: Physician Assistant

## 2019-07-06 ENCOUNTER — Encounter: Payer: Self-pay | Admitting: Physician Assistant

## 2019-07-06 VITALS — BP 139/89 | HR 123 | Temp 98.4°F | Ht 62.0 in | Wt 267.1 lb

## 2019-07-06 DIAGNOSIS — R635 Abnormal weight gain: Secondary | ICD-10-CM | POA: Diagnosis not present

## 2019-07-06 DIAGNOSIS — N3000 Acute cystitis without hematuria: Secondary | ICD-10-CM

## 2019-07-06 DIAGNOSIS — R300 Dysuria: Secondary | ICD-10-CM | POA: Diagnosis not present

## 2019-07-06 LAB — MICROSCOPIC EXAMINATION: Renal Epithel, UA: NONE SEEN /hpf

## 2019-07-06 LAB — URINALYSIS, COMPLETE
Bilirubin, UA: NEGATIVE
Glucose, UA: NEGATIVE
Ketones, UA: NEGATIVE
Leukocytes,UA: NEGATIVE
Nitrite, UA: POSITIVE — AB
Protein,UA: NEGATIVE
Specific Gravity, UA: >1.03 — ABNORMAL HIGH (ref 1.005–1.030)
Urobilinogen, Ur: 0.2 mg/dL (ref 0.2–1.0)
pH, UA: 5.5 (ref 5.0–7.5)

## 2019-07-06 MED ORDER — SULFAMETHOXAZOLE-TRIMETHOPRIM 800-160 MG PO TABS: 1.0000 | ORAL_TABLET | Freq: Two times a day (BID) | ORAL | 0 refills | Status: DC

## 2019-07-06 MED ORDER — TOPIRAMATE 25 MG PO TABS: 25.0000 mg | ORAL_TABLET | Freq: Two times a day (BID) | ORAL | 1 refills | Status: DC

## 2019-07-06 NOTE — Progress Notes (Signed)
.   Acute Office Visit  Subjective:    Patient ID: Nicole Santos, female    DOB: 1970-06-15, 49 y.o.   MRN: [id number]  Chief Complaint  Patient presents with  . Dysuria    Dysuria  This is a new problem. The current episode started in the past 7 days. The problem occurs every urination. The problem has been gradually worsening. Associated symptoms include frequency and urgency. Pertinent negatives include no chills, discharge, flank pain or nausea. She has tried increased fluids for the symptoms. The treatment provided no relief.    Patient also complains of weight gain.  She states that last year we tried phentermine for her appetite and it did not change much.  Would like to go on to a different medication.  We have discussed Topamax as an appetite suppressant.  I explained to her that it is first a seizure medication but that it decreases appetite and can make some foods and drinks taste bad.  She is willing to start medication.  We will plan to recheck her in 6 weeks for her weight and labs.  Past Medical History:  Diagnosis Date  . Acute respiratory disease due to COVID-19 virus 02/02/2019  . Cancer ([place])   . Diabetes mellitus without complication ([place])   . Hypertension     Past Surgical History:  Procedure Laterality Date  . KNEE SURGERY Right    Arthroscopic  . TONSILLECTOMY AND ADENOIDECTOMY      Family History  Problem Relation Age of Onset  . Hypertension Mother     Social History   Socioeconomic History  . Marital status: Single    Spouse name: Not on file  . Number of children: Not on file  . Years of education: Not on file  . Highest education level: Not on file  Occupational History  . Not on file  Tobacco Use  . Smoking status: Never Smoker  . Smokeless tobacco: Never Used  Substance and Sexual Activity  . Alcohol use: No  . Drug use: No  . Sexual activity: Not on file  Other Topics Concern  . Not on file  Social History Narrative  . Not on file    Social Determinants of Health   Financial Resource Strain:   . Difficulty of Paying Living Expenses: Not on file  Food Insecurity:   . Worried About Charity fundraiser in the Last Year: Not on file  . Ran Out of Food in the Last Year: Not on file  Transportation Needs:   . Lack of Transportation (Medical): Not on file  . Lack of Transportation (Non-Medical): Not on file  Physical Activity:   . Days of Exercise per Week: Not on file  . Minutes of Exercise per Session: Not on file  Stress:   . Feeling of Stress : Not on file  Social Connections:   . Frequency of Communication with Friends and Family: Not on file  . Frequency of Social Gatherings with Friends and Family: Not on file  . Attends Religious Services: Not on file  . Active Member of Clubs or Organizations: Not on file  . Attends Archivist Meetings: Not on file  . Marital Status: Not on file  Intimate Partner Violence:   . Fear of Current or Ex-Partner: Not on file  . Emotionally Abused: Not on file  . Physically Abused: Not on file  . Sexually Abused: Not on file    Outpatient Medications Prior to Visit  Medication  Sig Dispense Refill  . aspirin EC 81 MG tablet Take 81 mg by mouth daily.    Marland Kitchen atorvastatin (LIPITOR) 20 MG tablet Take 1 tablet (20 mg total) by mouth daily. 90 tablet 3  . blood glucose meter kit and supplies KIT Dispense based on patient and insurance preference. Use up to four times daily as directed. (FOR ICD-9 250.00, 250.01). 1 each 0  . cimetidine (TAGAMET) 200 MG tablet Take 200 mg by mouth as needed.     Marland Kitchen ibuprofen (ADVIL) 200 MG tablet Take 200 mg by mouth every 6 (six) hours as needed for mild pain or moderate pain.    Marland Kitchen lisinopril (ZESTRIL) 2.5 MG tablet Take 1-2 tablets (2.5-5 mg total) by mouth daily. 180 tablet 1  . metFORMIN (GLUCOPHAGE) 500 MG tablet Take 1 tablet (500 mg total) by mouth daily. 90 tablet 3   No facility-administered medications prior to visit.     Allergies  Allergen Reactions  . Augmentin [Amoxicillin-Pot Clavulanate] Diarrhea and Nausea And Vomiting  . Doxycycline     Abdominal cramps.    Review of Systems  Constitutional: Negative.  Negative for chills.  HENT: Negative.   Eyes: Negative.   Respiratory: Negative.   Gastrointestinal: Negative.  Negative for nausea.  Genitourinary: Positive for difficulty urinating, dysuria, frequency and urgency. Negative for flank pain.       Objective:    Physical Exam Constitutional:      Appearance: She is well-developed.  HENT:     Head: Normocephalic and atraumatic.  Eyes:     Conjunctiva/sclera: Conjunctivae normal.     Pupils: Pupils are equal, round, and reactive to light.  Cardiovascular:     Rate and Rhythm: Normal rate and regular rhythm.     Pulses: Normal pulses.  Pulmonary:     Effort: Pulmonary effort is normal.  Abdominal:     General: Bowel sounds are normal. There is no distension.     Palpations: Abdomen is soft. There is no mass.     Tenderness: There is abdominal tenderness in the suprapubic area. There is no guarding or rebound.  Skin:    General: Skin is warm and dry.     Findings: No rash.  Neurological:     Mental Status: She is alert and oriented to person, place, and time.     Deep Tendon Reflexes: Reflexes are normal and symmetric.  Psychiatric:        Behavior: Behavior normal.        Thought Content: Thought content normal.        Judgment: Judgment normal.     BP 139/89   Pulse (!) 123   Temp 98.4 F (36.9 C)   Ht 5' 2"  (1.575 m)   Wt 267 lb 2 oz (121.2 kg)   SpO2 98%   BMI 48.86 kg/m  Wt Readings from Last 3 Encounters:  07/06/19 267 lb 2 oz (121.2 kg)  04/03/19 261 lb 9.6 oz (118.7 kg)  02/02/19 265 lb (120.2 kg)    Health Maintenance Due  Topic Date Due  . OPHTHALMOLOGY EXAM  09/10/1978  . PAP SMEAR-Modifier  09/09/1989  . MAMMOGRAM  11/29/2018    There are no preventive care reminders to display for this  patient.   Lab Results  Component Value Date   TSH 0.857 08/22/2018   Lab Results  Component Value Date   WBC 7.8 02/07/2019   HGB 14.3 02/07/2019   HCT 44.0 02/07/2019   MCV 90.0 02/07/2019  PLT 361 02/07/2019   Lab Results  Component Value Date   NA 142 02/08/2019   K 4.1 02/08/2019   CO2 28 02/08/2019   GLUCOSE 188 (H) 02/08/2019   BUN 23 (H) 02/08/2019   CREATININE 0.62 02/08/2019   BILITOT 0.9 02/07/2019   ALKPHOS 39 02/07/2019   AST 15 02/07/2019   ALT 25 02/07/2019   PROT 6.9 02/07/2019   ALBUMIN 3.3 (L) 02/07/2019   CALCIUM 8.3 (L) 02/08/2019   ANIONGAP 12 02/08/2019   Lab Results  Component Value Date   CHOL 135 08/22/2018   Lab Results  Component Value Date   HDL 47 08/22/2018   Lab Results  Component Value Date   LDLCALC 74 08/22/2018   Lab Results  Component Value Date   TRIG 70 08/22/2018   Lab Results  Component Value Date   CHOLHDL 2.9 08/22/2018   Lab Results  Component Value Date   HGBA1C 6.4 (H) 02/02/2019       Assessment & Plan:   Problem List Items Addressed This Visit      Other   Weight gain   Relevant Medications   topiramate (TOPAMAX) 25 MG tablet    Other Visit Diagnoses    Dysuria    -  Primary   Relevant Orders   Urine Culture   Urinalysis, Complete   Acute cystitis without hematuria       Relevant Medications   sulfamethoxazole-trimethoprim (BACTRIM DS) 800-160 MG tablet       Meds ordered this encounter  Medications  . sulfamethoxazole-trimethoprim (BACTRIM DS) 800-160 MG tablet    Sig: Take 1 tablet by mouth 2 (two) times daily.    Dispense:  14 tablet    Refill:  0    Order Specific Question:   Supervising Provider    Answer:   Janora Norlander [[id number]]  . topiramate (TOPAMAX) 25 MG tablet    Sig: Take 1 tablet (25 mg total) by mouth 2 (two) times daily.    Dispense:  60 tablet    Refill:  1    Order Specific Question:   Supervising Provider    Answer:   Janora Norlander [[id number]]      Terald Sleeper, PA-C  Terald Sleeper PA-C [place] [address]  [place], [place] [address] [phone]

## 2019-07-08 LAB — URINE CULTURE

## 2019-08-19 ENCOUNTER — Ambulatory Visit: Payer: Commercial Managed Care - PPO | Admitting: Physician Assistant

## 2019-08-19 ENCOUNTER — Encounter: Payer: Self-pay | Admitting: Physician Assistant

## 2019-08-19 ENCOUNTER — Other Ambulatory Visit: Payer: Self-pay

## 2019-08-19 VITALS — BP 121/73 | HR 90 | Temp 96.8°F | Ht 62.0 in | Wt 267.4 lb

## 2019-08-19 DIAGNOSIS — I10 Essential (primary) hypertension: Secondary | ICD-10-CM

## 2019-08-19 DIAGNOSIS — R635 Abnormal weight gain: Secondary | ICD-10-CM | POA: Diagnosis not present

## 2019-08-19 DIAGNOSIS — E119 Type 2 diabetes mellitus without complications: Secondary | ICD-10-CM

## 2019-08-19 DIAGNOSIS — Z0000 Encounter for general adult medical examination without abnormal findings: Secondary | ICD-10-CM

## 2019-08-19 LAB — BAYER DCA HB A1C WAIVED: HB A1C (BAYER DCA - WAIVED): 6.7 % (ref <7.0)

## 2019-08-19 MED ORDER — LISINOPRIL 2.5 MG PO TABS: 2.5000 mg | ORAL_TABLET | Freq: Every day | ORAL | 1 refills | Status: DC

## 2019-08-19 MED ORDER — TOPIRAMATE 50 MG PO TABS: 50.0000 mg | ORAL_TABLET | Freq: Two times a day (BID) | ORAL | 5 refills | Status: DC

## 2019-08-20 LAB — CBC WITH DIFFERENTIAL/PLATELET
Basophils Absolute: 0 10*3/uL (ref 0.0–0.2)
Basos: 1 %
EOS (ABSOLUTE): 0.2 10*3/uL (ref 0.0–0.4)
Eos: 4 %
Hematocrit: 41.6 % (ref 34.0–46.6)
Hemoglobin: 14.1 g/dL (ref 11.1–15.9)
Immature Grans (Abs): 0 10*3/uL (ref 0.0–0.1)
Immature Granulocytes: 0 %
Lymphocytes Absolute: 1.7 10*3/uL (ref 0.7–3.1)
Lymphs: 33 %
MCH: 29.8 pg (ref 26.6–33.0)
MCHC: 33.9 g/dL (ref 31.5–35.7)
MCV: 88 fL (ref 79–97)
Monocytes Absolute: 0.5 10*3/uL (ref 0.1–0.9)
Monocytes: 9 %
Neutrophils Absolute: 2.7 10*3/uL (ref 1.4–7.0)
Neutrophils: 53 %
Platelets: 319 10*3/uL (ref 150–450)
RBC: 4.73 x10E6/uL (ref 3.77–5.28)
RDW: 12.5 % (ref 11.7–15.4)
WBC: 5.1 10*3/uL (ref 3.4–10.8)

## 2019-08-20 LAB — CMP14+EGFR
ALT: 17 IU/L (ref 0–32)
AST: 12 IU/L (ref 0–40)
Albumin/Globulin Ratio: 2 (ref 1.2–2.2)
Albumin: 4 g/dL (ref 3.8–4.8)
Alkaline Phosphatase: 65 IU/L (ref 39–117)
BUN/Creatinine Ratio: 20 (ref 9–23)
BUN: 14 mg/dL (ref 6–24)
Bilirubin Total: 0.6 mg/dL (ref 0.0–1.2)
CO2: 24 mmol/L (ref 20–29)
Calcium: 8.9 mg/dL (ref 8.7–10.2)
Chloride: 101 mmol/L (ref 96–106)
Creatinine, Ser: 0.71 mg/dL (ref 0.57–1.00)
GFR calc Af Amer: 115 mL/min/{1.73_m2} (ref >59)
GFR calc non Af Amer: 100 mL/min/{1.73_m2} (ref >59)
Globulin, Total: 2 g/dL (ref 1.5–4.5)
Glucose: 123 mg/dL — ABNORMAL HIGH (ref 65–99)
Potassium: 4.4 mmol/L (ref 3.5–5.2)
Sodium: 138 mmol/L (ref 134–144)
Total Protein: 6 g/dL (ref 6.0–8.5)

## 2019-08-20 LAB — THYROID PANEL WITH TSH
Free Thyroxine Index: 2 (ref 1.2–4.9)
T3 Uptake Ratio: 23 % — ABNORMAL LOW (ref 24–39)
T4, Total: 8.8 ug/dL (ref 4.5–12.0)
TSH: 1.29 u[IU]/mL (ref 0.450–4.500)

## 2019-08-20 LAB — LIPID PANEL
Chol/HDL Ratio: 2.8 ratio (ref 0.0–4.4)
Cholesterol, Total: 153 mg/dL (ref 100–199)
HDL: 54 mg/dL (ref >39)
LDL Chol Calc (NIH): 84 mg/dL (ref 0–99)
Triglycerides: 75 mg/dL (ref 0–149)
VLDL Cholesterol Cal: 15 mg/dL (ref 5–40)

## 2019-08-27 NOTE — Progress Notes (Signed)
BP 121/73   Pulse 90   Temp (!) 96.8 F (36 C)   Ht _0  (1.575 m)   Wt 267 lb 6.4 oz (121.3 kg)   SpO2 97%   BMI 48.91 kg/m    Subjective:    Patient ID: Nicole Santos, female    DOB: May 10, 1971, 49 y.o.   MRN: [id number]  HPI: Nicole Santos is a 49 y.o. female presenting on 08/19/2019 for Medical Management of Chronic Issues, Diabetes, Hypertension, and Hyperlipidemia  Diabetes She presents for her follow-up diabetic visit. She has type 2 diabetes mellitus. Her disease course has been stable. Pertinent negatives for diabetes include no chest pain and no fatigue. Current diabetic treatment includes oral agent (monotherapy). She is following a diabetic diet. An ACE inhibitor/angiotensin II receptor blocker is being taken.  Hypertension This is a chronic problem. The current episode started more than 1 year ago. The problem is controlled. Pertinent negatives include no chest pain. Past treatments include ACE inhibitors.  Hyperlipidemia This is a chronic problem. The problem is controlled. Exacerbating diseases include diabetes. Pertinent negatives include no chest pain. Current antihyperlipidemic treatment includes statins.     Past Medical History:  Diagnosis Date  . Acute respiratory disease due to COVID-19 virus 02/02/2019  . Cancer ([place])   . Diabetes mellitus without complication ([place])   . Hypertension    Relevant past medical, surgical, family and social history reviewed and updated as indicated. Interim medical history since our last visit reviewed. Allergies and medications reviewed and updated. DATA REVIEWED: CHART IN EPIC  Family History reviewed for pertinent findings.  Review of Systems  Constitutional: Negative.  Negative for activity change, fatigue and fever.  HENT: Negative.   Eyes: Negative.   Respiratory: Negative.  Negative for cough.   Cardiovascular: Negative.  Negative for chest pain.  Gastrointestinal: Negative.  Negative for abdominal pain.  Endocrine:  Negative.   Genitourinary: Negative.  Negative for dysuria.  Musculoskeletal: Negative.   Skin: Negative.   Neurological: Negative.     Allergies as of 08/19/2019      Reactions   Augmentin [amoxicillin-pot Clavulanate] Diarrhea, Nausea And Vomiting   Doxycycline    Abdominal cramps.      Medication List       Accurate as of August 19, 2019 11:59 PM. If you have any questions, ask your nurse or doctor.        STOP taking these medications   sulfamethoxazole-trimethoprim 800-160 MG tablet Commonly known as: Bactrim DS Stopped by: Terald Sleeper, PA-C     TAKE these medications   aspirin EC 81 MG tablet Take 81 mg by mouth daily.   atorvastatin 20 MG tablet Commonly known as: LIPITOR Take 1 tablet (20 mg total) by mouth daily.   blood glucose meter kit and supplies Kit Dispense based on patient and insurance preference. Use up to four times daily as directed. (FOR ICD-9 250.00, 250.01).   cimetidine 200 MG tablet Commonly known as: TAGAMET Take 200 mg by mouth as needed.   ibuprofen 200 MG tablet Commonly known as: ADVIL Take 200 mg by mouth every 6 (six) hours as needed for mild pain or moderate pain.   lisinopril 2.5 MG tablet Commonly known as: ZESTRIL Take 1-2 tablets (2.5-5 mg total) by mouth daily.   metFORMIN 500 MG tablet Commonly known as: GLUCOPHAGE Take 1 tablet (500 mg total) by mouth daily.   topiramate 50 MG tablet Commonly known as: Topamax Take 1 tablet (50 mg  total) by mouth 2 (two) times daily. What changed:   medication strength  how much to take Changed by: Terald Sleeper, PA-C          Objective:    BP 121/73   Pulse 90   Temp (!) 96.8 F (36 C)   Ht '5\' 2"'$  (1.575 m)   Wt 267 lb 6.4 oz (121.3 kg)   SpO2 97%   BMI 48.91 kg/m   Allergies  Allergen Reactions  . Augmentin [Amoxicillin-Pot Clavulanate] Diarrhea and Nausea And Vomiting  . Doxycycline     Abdominal cramps.    Wt Readings from Last 3 Encounters:  08/19/19  267 lb 6.4 oz (121.3 kg)  07/06/19 267 lb 2 oz (121.2 kg)  04/03/19 261 lb 9.6 oz (118.7 kg)    Physical Exam Constitutional:      General: She is not in acute distress.    Appearance: Normal appearance. She is well-developed.  HENT:     Head: Normocephalic and atraumatic.  Cardiovascular:     Rate and Rhythm: Normal rate.  Pulmonary:     Effort: Pulmonary effort is normal.  Skin:    General: Skin is warm and dry.     Findings: No rash.  Neurological:     Mental Status: She is alert and oriented to person, place, and time.     Deep Tendon Reflexes: Reflexes are normal and symmetric.     Results for orders placed or performed in visit on 08/19/19  Bayer DCA Hb A1c Waived  Result Value Ref Range   HB A1C (BAYER DCA - WAIVED) 6.7 <7.0 %  Lipid panel  Result Value Ref Range   Cholesterol, Total 153 100 - 199 mg/dL   Triglycerides 75 0 - 149 mg/dL   HDL 54 >39 mg/dL   VLDL Cholesterol Cal 15 5 - 40 mg/dL   LDL Chol Calc (NIH) 84 0 - 99 mg/dL   Chol/HDL Ratio 2.8 0.0 - 4.4 ratio  CMP14+EGFR  Result Value Ref Range   Glucose 123 (H) 65 - 99 mg/dL   BUN 14 6 - 24 mg/dL   Creatinine, Ser 0.71 0.57 - 1.00 mg/dL   GFR calc non Af Amer 100 >59 mL/min/1.73   GFR calc Af Amer 115 >59 mL/min/1.73   BUN/Creatinine Ratio 20 9 - 23   Sodium 138 134 - 144 mmol/L   Potassium 4.4 3.5 - 5.2 mmol/L   Chloride 101 96 - 106 mmol/L   CO2 24 20 - 29 mmol/L   Calcium 8.9 8.7 - 10.2 mg/dL   Total Protein 6.0 6.0 - 8.5 g/dL   Albumin 4.0 3.8 - 4.8 g/dL   Globulin, Total 2.0 1.5 - 4.5 g/dL   Albumin/Globulin Ratio 2.0 1.2 - 2.2   Bilirubin Total 0.6 0.0 - 1.2 mg/dL   Alkaline Phosphatase 65 39 - 117 IU/L   AST 12 0 - 40 IU/L   ALT 17 0 - 32 IU/L  CBC with Differential/Platelet  Result Value Ref Range   WBC 5.1 3.4 - 10.8 x10E3/uL   RBC 4.73 3.77 - 5.28 x10E6/uL   Hemoglobin 14.1 11.1 - 15.9 g/dL   Hematocrit 41.6 34.0 - 46.6 %   MCV 88 79 - 97 fL   MCH 29.8 26.6 - 33.0 pg   MCHC 33.9  31.5 - 35.7 g/dL   RDW 12.5 11.7 - 15.4 %   Platelets 319 150 - 450 x10E3/uL   Neutrophils 53 Not Estab. %   Lymphs 33  Not Estab. %   Monocytes 9 Not Estab. %   Eos 4 Not Estab. %   Basos 1 Not Estab. %   Neutrophils Absolute 2.7 1.4 - 7.0 x10E3/uL   Lymphocytes Absolute 1.7 0.7 - 3.1 x10E3/uL   Monocytes Absolute 0.5 0.1 - 0.9 x10E3/uL   EOS (ABSOLUTE) 0.2 0.0 - 0.4 x10E3/uL   Basophils Absolute 0.0 0.0 - 0.2 x10E3/uL   Immature Granulocytes 0 Not Estab. %   Immature Grans (Abs) 0.0 0.0 - 0.1 x10E3/uL  Thyroid Panel With TSH  Result Value Ref Range   TSH 1.290 0.450 - 4.500 uIU/mL   T4, Total 8.8 4.5 - 12.0 ug/dL   T3 Uptake Ratio 23 (L) 24 - 39 %   Free Thyroxine Index 2.0 1.2 - 4.9      Assessment & Plan:   1. Diabetes mellitus without complication ([place]) - Bayer DCA Hb A1c Waived - CMP14+EGFR  2. Essential hypertension - lisinopril (ZESTRIL) 2.5 MG tablet; Take 1-2 tablets (2.5-5 mg total) by mouth daily.  Dispense: 180 tablet; Refill: 1 - CMP14+EGFR  3. Weight gain - topiramate (TOPAMAX) 50 MG tablet; Take 1 tablet (50 mg total) by mouth 2 (two) times daily.  Dispense: 60 tablet; Refill: 5  4. Well adult exam - Lipid panel - CMP14+EGFR - CBC with Differential/Platelet - Thyroid Panel With TSH   Continue all other maintenance medications as listed above.  Follow up plan: Return in about 6 months (around 02/19/2020).  Educational handout given for carb counting  Terald Sleeper PA-C [facility] [address]  [place], [place] [address] [phone]   08/27/2019, 1:56 PM

## 2019-10-02 ENCOUNTER — Ambulatory Visit: Payer: Commercial Managed Care - PPO | Admitting: Physician Assistant

## 2019-10-05 ENCOUNTER — Encounter: Payer: Self-pay | Admitting: *Deleted

## 2020-02-19 ENCOUNTER — Ambulatory Visit: Payer: Commercial Managed Care - PPO | Admitting: Physician Assistant

## 2020-02-19 ENCOUNTER — Ambulatory Visit: Payer: Commercial Managed Care - PPO | Admitting: Family Medicine

## 2020-02-19 ENCOUNTER — Encounter: Payer: Self-pay | Admitting: Family Medicine

## 2020-02-19 ENCOUNTER — Other Ambulatory Visit: Payer: Self-pay

## 2020-02-19 VITALS — BP 131/85 | HR 109 | Temp 97.5°F | Ht 62.0 in | Wt 279.0 lb

## 2020-02-19 DIAGNOSIS — E1169 Type 2 diabetes mellitus with other specified complication: Secondary | ICD-10-CM

## 2020-02-19 DIAGNOSIS — I10 Essential (primary) hypertension: Secondary | ICD-10-CM

## 2020-02-19 DIAGNOSIS — E119 Type 2 diabetes mellitus without complications: Secondary | ICD-10-CM | POA: Diagnosis not present

## 2020-02-19 DIAGNOSIS — Z1159 Encounter for screening for other viral diseases: Secondary | ICD-10-CM

## 2020-02-19 DIAGNOSIS — R413 Other amnesia: Secondary | ICD-10-CM

## 2020-02-19 DIAGNOSIS — E785 Hyperlipidemia, unspecified: Secondary | ICD-10-CM

## 2020-02-19 DIAGNOSIS — R635 Abnormal weight gain: Secondary | ICD-10-CM

## 2020-02-19 LAB — BAYER DCA HB A1C WAIVED: HB A1C (BAYER DCA - WAIVED): 6.9 % (ref <7.0)

## 2020-02-19 MED ORDER — METFORMIN HCL 500 MG PO TABS: 500.0000 mg | ORAL_TABLET | Freq: Every day | ORAL | 1 refills | Status: DC

## 2020-02-19 MED ORDER — ATORVASTATIN CALCIUM 20 MG PO TABS: 20.0000 mg | ORAL_TABLET | Freq: Every day | ORAL | 1 refills | Status: DC

## 2020-02-19 MED ORDER — LISINOPRIL 2.5 MG PO TABS: 2.5000 mg | ORAL_TABLET | Freq: Every day | ORAL | 1 refills | Status: DC

## 2020-02-19 NOTE — Progress Notes (Signed)
Assessment & Plan:  1. Diabetes mellitus without complication ([place]) Lab Results  Component Value Date   HGBA1C 6.9 02/19/2020   HGBA1C 6.7 08/19/2019   HGBA1C 6.4 (H) 02/02/2019  - Diabetes is at goal of A1c < 7. - Medications: continue current medications, discussed switching to Ozempic to also help with weight loss, but patient is not sure she wants to do this - Patient is currently taking a statin. Patient is taking an ACE-inhibitor/ARB.  - Last foot exam: 04/03/2019 - Urine Microalbumin/Creat Ratio: 02/19/2020 - Instruction/counseling given: discussed diet and provided printed educational material - Bayer DCA Hb A1c Waived - metFORMIN (GLUCOPHAGE) 500 MG tablet; Take 1 tablet (500 mg total) by mouth daily.  Dispense: 90 tablet; Refill: 1 - atorvastatin (LIPITOR) 20 MG tablet; Take 1 tablet (20 mg total) by mouth daily.  Dispense: 90 tablet; Refill: 1 - lisinopril (ZESTRIL) 2.5 MG tablet; Take 1-2 tablets (2.5-5 mg total) by mouth daily.  Dispense: 180 tablet; Refill: 1 - Microalbumin / creatinine urine ratio - CMP14+EGFR  2. Hyperlipidemia associated with type 2 diabetes mellitus ([place]) - Well controlled on current regimen.  - atorvastatin (LIPITOR) 20 MG tablet; Take 1 tablet (20 mg total) by mouth daily.  Dispense: 90 tablet; Refill: 1 - CMP14+EGFR  3. Essential hypertension - Well controlled on current regimen.  - lisinopril (ZESTRIL) 2.5 MG tablet; Take 1-2 tablets (2.5-5 mg total) by mouth daily.  Dispense: 180 tablet; Refill: 1 - CMP14+EGFR  4-5. Morbid obesity (HCC)/Weight gain - Discussed starting Ozempic for diabetes to help with weight loss but patient is not sure she wants to do this. Encouraged diet and exercise. Offered referral to Dr. Leafy Santos in [place] for help with weight loss, but patient declined as well.   6. Memory changes - Recommended Prevagen for memory.  - CMP14+EGFR - RPR - Thyroid Panel With TSH - Vitamin B12  7. Encounter for hepatitis C  screening test for low risk patient - Hepatitis C antibody   Return in about 3 months (around 05/20/2020) for DM.  Nicole Limes, MSN, APRN, FNP-C [facility]  Subjective:    Patient ID: Nicole Santos, female    DOB: Dec 07, 1970, 49 y.o.   MRN: [id number]  Patient Care Team: Nicole Brooklyn, FNP as PCP - General (Family Medicine)   Chief Complaint:  Chief Complaint  Patient presents with  . Establish Care    Nicole Santos pt   . Diabetes    check up of chronic medical conditions  . Weight Loss    Talk about options  . Memory Loss    Patient states that her short term memory is not like it used to be.    HPI: Nicole Santos is a 49 y.o. female presenting on 02/19/2020 for Establish Care Nicole Santos pt ), Diabetes (check up of chronic medical conditions), Weight Loss (Talk about options), and Memory Loss (Patient states that her short term memory is not like it used to be.)  Diabetes: Patient presents for follow up of diabetes. Current symptoms include: none. Known diabetic complications: none. Medication compliance: yes. Current diet: in general, an "unhealthy" diet. Current exercise: none. Is she  on ACE inhibitor or angiotensin II receptor blocker? Yes. Is she on a statin? Yes.   Lab Results  Component Value Date   HGBA1C 6.9 02/19/2020   HGBA1C 6.7 08/19/2019   HGBA1C 6.4 (H) 02/02/2019   Lab Results  Component Value Date   LDLCALC 84 08/19/2019   CREATININE 0.79  02/19/2020    Patient is concerned about her weight. She was previously prescribed Topamax to help with weight loss, which was not effective.   New complaints: Patient is worried about her short term memory. She gives an example of going into a room and forgetting why. She reports a family history of dementia and is afraid it is happening to her.   Social history:  Relevant past medical, surgical, family and social history reviewed and updated as indicated. Interim medical history since our last visit  reviewed.  Allergies and medications reviewed and updated.  DATA REVIEWED: CHART IN EPIC  ROS: Negative unless specifically indicated above in HPI.    Current Outpatient Medications:  .  aspirin EC 81 MG tablet, Take 81 mg by mouth daily., Disp: , Rfl:  .  atorvastatin (LIPITOR) 20 MG tablet, Take 1 tablet (20 mg total) by mouth daily., Disp: 90 tablet, Rfl: 3 .  blood glucose meter kit and supplies KIT, Dispense based on patient and insurance preference. Use up to four times daily as directed. (FOR ICD-9 250.00, 250.01)., Disp: 1 each, Rfl: 0 .  cimetidine (TAGAMET) 200 MG tablet, Take 200 mg by mouth as needed. , Disp: , Rfl:  .  ibuprofen (ADVIL) 200 MG tablet, Take 200 mg by mouth every 6 (six) hours as needed for mild pain or moderate pain., Disp: , Rfl:  .  lisinopril (ZESTRIL) 2.5 MG tablet, Take 1-2 tablets (2.5-5 mg total) by mouth daily., Disp: 180 tablet, Rfl: 1 .  metFORMIN (GLUCOPHAGE) 500 MG tablet, Take 1 tablet (500 mg total) by mouth daily., Disp: 90 tablet, Rfl: 3   Allergies  Allergen Reactions  . Augmentin [Amoxicillin-Pot Clavulanate] Diarrhea and Nausea And Vomiting  . Doxycycline     Abdominal cramps.  . Topamax [Topiramate] Nausea And Vomiting   Past Medical History:  Diagnosis Date  . Acute respiratory disease due to COVID-19 virus 02/02/2019  . Cancer ([place])   . Diabetes mellitus without complication ([place])   . Hypertension     Past Surgical History:  Procedure Laterality Date  . KNEE SURGERY Right    Arthroscopic  . TONSILLECTOMY AND ADENOIDECTOMY      Social History   Socioeconomic History  . Marital status: Single    Spouse name: Not on file  . Number of children: Not on file  . Years of education: Not on file  . Highest education level: Not on file  Occupational History  . Not on file  Tobacco Use  . Smoking status: Never Smoker  . Smokeless tobacco: Never Used  Vaping Use  . Vaping Use: Never used  Substance and Sexual Activity  .  Alcohol use: No  . Drug use: No  . Sexual activity: Not on file  Other Topics Concern  . Not on file  Social History Narrative  . Not on file   Social Determinants of Health   Financial Resource Strain:   . Difficulty of Paying Living Expenses: Not on file  Food Insecurity:   . Worried About Charity fundraiser in the Last Year: Not on file  . Ran Out of Food in the Last Year: Not on file  Transportation Needs:   . Lack of Transportation (Medical): Not on file  . Lack of Transportation (Non-Medical): Not on file  Physical Activity:   . Days of Exercise per Week: Not on file  . Minutes of Exercise per Session: Not on file  Stress:   . Feeling of Stress :  Not on file  Social Connections:   . Frequency of Communication with Friends and Family: Not on file  . Frequency of Social Gatherings with Friends and Family: Not on file  . Attends Religious Services: Not on file  . Active Member of Clubs or Organizations: Not on file  . Attends Archivist Meetings: Not on file  . Marital Status: Not on file  Intimate Partner Violence:   . Fear of Current or Ex-Partner: Not on file  . Emotionally Abused: Not on file  . Physically Abused: Not on file  . Sexually Abused: Not on file        Objective:    BP 131/85   Pulse (!) 109   Temp (!) 97.5 F (36.4 C) (Temporal)   Ht 5' 2"  (1.575 m)   Wt 279 lb (126.6 kg)   SpO2 96%   BMI 51.03 kg/m   Wt Readings from Last 3 Encounters:  02/19/20 279 lb (126.6 kg)  08/19/19 267 lb 6.4 oz (121.3 kg)  07/06/19 267 lb 2 oz (121.2 kg)    Physical Exam Vitals reviewed.  Constitutional:      General: She is not in acute distress.    Appearance: Normal appearance. She is morbidly obese. She is not ill-appearing, toxic-appearing or diaphoretic.  HENT:     Head: Normocephalic and atraumatic.  Eyes:     General: No scleral icterus.       Right eye: No discharge.        Left eye: No discharge.     Conjunctiva/sclera: Conjunctivae  normal.  Cardiovascular:     Rate and Rhythm: Normal rate and regular rhythm.     Heart sounds: Normal heart sounds. No murmur heard.  No friction rub. No gallop.   Pulmonary:     Effort: Pulmonary effort is normal. No respiratory distress.     Breath sounds: Normal breath sounds. No stridor. No wheezing, rhonchi or rales.  Musculoskeletal:        General: Normal range of motion.     Cervical back: Normal range of motion.  Skin:    General: Skin is warm and dry.     Capillary Refill: Capillary refill takes less than 2 seconds.  Neurological:     General: No focal deficit present.     Mental Status: She is alert and oriented to person, place, and time. Mental status is at baseline.  Psychiatric:        Mood and Affect: Mood normal.        Behavior: Behavior normal.        Thought Content: Thought content normal.        Judgment: Judgment normal.     Lab Results  Component Value Date   TSH 1.290 08/19/2019   Lab Results  Component Value Date   WBC 5.1 08/19/2019   HGB 14.1 08/19/2019   HCT 41.6 08/19/2019   MCV 88 08/19/2019   PLT 319 08/19/2019   Lab Results  Component Value Date   NA 138 08/19/2019   K 4.4 08/19/2019   CO2 24 08/19/2019   GLUCOSE 123 (H) 08/19/2019   BUN 14 08/19/2019   CREATININE 0.71 08/19/2019   BILITOT 0.6 08/19/2019   ALKPHOS 65 08/19/2019   AST 12 08/19/2019   ALT 17 08/19/2019   PROT 6.0 08/19/2019   ALBUMIN 4.0 08/19/2019   CALCIUM 8.9 08/19/2019   ANIONGAP 12 02/08/2019   Lab Results  Component Value Date   CHOL 153 08/19/2019  Lab Results  Component Value Date   HDL 54 08/19/2019   Lab Results  Component Value Date   LDLCALC 84 08/19/2019   Lab Results  Component Value Date   TRIG 75 08/19/2019   Lab Results  Component Value Date   CHOLHDL 2.8 08/19/2019   Lab Results  Component Value Date   HGBA1C 6.7 08/19/2019

## 2020-02-19 NOTE — Patient Instructions (Addendum)
Consider switching metformin to Ozempic for diabetes and weight loss - if you decide to do this, you can see a provider or Almyra Free (our clinical pharmacist).   Prevagen for memory.    Diabetes Mellitus and Nutrition, Adult When you have diabetes (diabetes mellitus), it is very important to have healthy eating habits because your blood sugar (glucose) levels are greatly affected by what you eat and drink. Eating healthy foods in the appropriate amounts, at about the same times every day, can help you:  Control your blood glucose.  Lower your risk of heart disease.  Improve your blood pressure.  Reach or maintain a healthy weight. Every person with diabetes is different, and each person has different needs for a meal plan. Your health care provider may recommend that you work with a diet and nutrition specialist (dietitian) to make a meal plan that is best for you. Your meal plan may vary depending on factors such as:  The calories you need.  The medicines you take.  Your weight.  Your blood glucose, blood pressure, and cholesterol levels.  Your activity level.  Other health conditions you have, such as heart or kidney disease. How do carbohydrates affect me? Carbohydrates, also called carbs, affect your blood glucose level more than any other type of food. Eating carbs naturally raises the amount of glucose in your blood. Carb counting is a method for keeping track of how many carbs you eat. Counting carbs is important to keep your blood glucose at a healthy level, especially if you use insulin or take certain oral diabetes medicines. It is important to know how many carbs you can safely have in each meal. This is different for every person. Your dietitian can help you calculate how many carbs you should have at each meal and for each snack. Foods that contain carbs include:  Bread, cereal, rice, pasta, and crackers.  Potatoes and corn.  Peas, beans, and lentils.  Milk and  yogurt.  Fruit and juice.  Desserts, such as cakes, cookies, ice cream, and candy. How does alcohol affect me? Alcohol can cause a sudden decrease in blood glucose (hypoglycemia), especially if you use insulin or take certain oral diabetes medicines. Hypoglycemia can be a life-threatening condition. Symptoms of hypoglycemia (sleepiness, dizziness, and confusion) are similar to symptoms of having too much alcohol. If your health care provider says that alcohol is safe for you, follow these guidelines:  Limit alcohol intake to no more than 1 drink per day for nonpregnant women and 2 drinks per day for men. One drink equals 12 oz of beer, 5 oz of wine, or 1 oz of hard liquor.  Do not drink on an empty stomach.  Keep yourself hydrated with water, diet soda, or unsweetened iced tea.  Keep in mind that regular soda, juice, and other mixers may contain a lot of sugar and must be counted as carbs. What are tips for following this plan?  Reading food labels  Start by checking the serving size on the "Nutrition Facts" label of packaged foods and drinks. The amount of calories, carbs, fats, and other nutrients listed on the label is based on one serving of the item. Many items contain more than one serving per package.  Check the total grams (g) of carbs in one serving. You can calculate the number of servings of carbs in one serving by dividing the total carbs by 15. For example, if a food has 30 g of total carbs, it would be equal to  2 servings of carbs.  Check the number of grams (g) of saturated and trans fats in one serving. Choose foods that have low or no amount of these fats.  Check the number of milligrams (mg) of salt (sodium) in one serving. Most people should limit total sodium intake to less than 2,300 mg per day.  Always check the nutrition information of foods labeled as "low-fat" or "nonfat". These foods may be higher in added sugar or refined carbs and should be avoided.  Talk to  your dietitian to identify your daily goals for nutrients listed on the label. Shopping  Avoid buying canned, premade, or processed foods. These foods tend to be high in fat, sodium, and added sugar.  Shop around the outside edge of the grocery store. This includes fresh fruits and vegetables, bulk grains, fresh meats, and fresh dairy. Cooking  Use low-heat cooking methods, such as baking, instead of high-heat cooking methods like deep frying.  Cook using healthy oils, such as olive, canola, or sunflower oil.  Avoid cooking with butter, cream, or high-fat meats. Meal planning  Eat meals and snacks regularly, preferably at the same times every day. Avoid going long periods of time without eating.  Eat foods high in fiber, such as fresh fruits, vegetables, beans, and whole grains. Talk to your dietitian about how many servings of carbs you can eat at each meal.  Eat 4-6 ounces (oz) of lean protein each day, such as lean meat, chicken, fish, eggs, or tofu. One oz of lean protein is equal to: ? 1 oz of meat, chicken, or fish. ? 1 egg. ?  cup of tofu.  Eat some foods each day that contain healthy fats, such as avocado, nuts, seeds, and fish. Lifestyle  Check your blood glucose regularly.  Exercise regularly as told by your health care provider. This may include: ? 150 minutes of moderate-intensity or vigorous-intensity exercise each week. This could be brisk walking, biking, or water aerobics. ? Stretching and doing strength exercises, such as yoga or weightlifting, at least 2 times a week.  Take medicines as told by your health care provider.  Do not use any products that contain nicotine or tobacco, such as cigarettes and e-cigarettes. If you need help quitting, ask your health care provider.  Work with a Social worker or diabetes educator to identify strategies to manage stress and any emotional and social challenges. Questions to ask a health care provider  Do I need to meet with  a diabetes educator?  Do I need to meet with a dietitian?  What number can I call if I have questions?  When are the best times to check my blood glucose? Where to find more information:  American Diabetes Association: diabetes.org  Academy of Nutrition and Dietetics: www.eatright.[employer] of Diabetes and Digestive and Kidney Diseases (NIH): [employer] Summary  A healthy meal plan will help you control your blood glucose and maintain a healthy lifestyle.  Working with a diet and nutrition specialist (dietitian) can help you make a meal plan that is best for you.  Keep in mind that carbohydrates (carbs) and alcohol have immediate effects on your blood glucose levels. It is important to count carbs and to use alcohol carefully. This information is not intended to replace advice given to you by your health care provider. Make sure you discuss any questions you have with your health care provider. Document Revised: 05/03/2017 Document Reviewed: 06/25/2016 Elsevier Patient Education  2020 [employer].

## 2020-02-20 LAB — CMP14+EGFR
ALT: 23 IU/L (ref 0–32)
AST: 16 IU/L (ref 0–40)
Albumin/Globulin Ratio: 1.9 (ref 1.2–2.2)
Albumin: 4.4 g/dL (ref 3.8–4.9)
Alkaline Phosphatase: 66 IU/L (ref 44–121)
BUN/Creatinine Ratio: 18 (ref 9–23)
BUN: 14 mg/dL (ref 6–24)
Bilirubin Total: 0.7 mg/dL (ref 0.0–1.2)
CO2: 21 mmol/L (ref 20–29)
Calcium: 9.3 mg/dL (ref 8.7–10.2)
Chloride: 101 mmol/L (ref 96–106)
Creatinine, Ser: 0.79 mg/dL (ref 0.57–1.00)
GFR calc Af Amer: 100 mL/min/{1.73_m2} (ref >59)
GFR calc non Af Amer: 87 mL/min/{1.73_m2} (ref >59)
Globulin, Total: 2.3 g/dL (ref 1.5–4.5)
Glucose: 167 mg/dL — ABNORMAL HIGH (ref 65–99)
Potassium: 4.4 mmol/L (ref 3.5–5.2)
Sodium: 138 mmol/L (ref 134–144)
Total Protein: 6.7 g/dL (ref 6.0–8.5)

## 2020-02-20 LAB — THYROID PANEL WITH TSH
Free Thyroxine Index: 2.1 (ref 1.2–4.9)
T3 Uptake Ratio: 23 % — ABNORMAL LOW (ref 24–39)
T4, Total: 9 ug/dL (ref 4.5–12.0)
TSH: 0.949 u[IU]/mL (ref 0.450–4.500)

## 2020-02-20 LAB — MICROALBUMIN / CREATININE URINE RATIO
Creatinine, Urine: 155.9 mg/dL
Microalb/Creat Ratio: 2 mg/g creat (ref 0–29)
Microalbumin, Urine: 3.6 ug/mL

## 2020-02-20 LAB — RPR: RPR Ser Ql: NONREACTIVE

## 2020-02-20 LAB — VITAMIN B12: Vitamin B-12: 253 pg/mL (ref 232–1245)

## 2020-02-20 LAB — HEPATITIS C ANTIBODY: Hep C Virus Ab: <0.1 s/co ratio (ref 0.0–0.9)

## 2020-02-21 ENCOUNTER — Encounter: Payer: Self-pay | Admitting: Family Medicine

## 2020-05-17 ENCOUNTER — Ambulatory Visit: Payer: Commercial Managed Care - PPO | Admitting: Nurse Practitioner

## 2020-05-31 ENCOUNTER — Ambulatory Visit: Payer: Commercial Managed Care - PPO | Admitting: Nurse Practitioner

## 2020-05-31 ENCOUNTER — Other Ambulatory Visit: Payer: Self-pay

## 2020-05-31 ENCOUNTER — Encounter: Payer: Self-pay | Admitting: Nurse Practitioner

## 2020-05-31 ENCOUNTER — Other Ambulatory Visit: Payer: Self-pay | Admitting: Nurse Practitioner

## 2020-05-31 VITALS — BP 130/85 | HR 110 | Temp 96.6°F | Ht 62.0 in | Wt 277.6 lb

## 2020-05-31 DIAGNOSIS — M549 Dorsalgia, unspecified: Secondary | ICD-10-CM | POA: Diagnosis not present

## 2020-05-31 DIAGNOSIS — E1169 Type 2 diabetes mellitus with other specified complication: Secondary | ICD-10-CM | POA: Diagnosis not present

## 2020-05-31 DIAGNOSIS — E119 Type 2 diabetes mellitus without complications: Secondary | ICD-10-CM | POA: Diagnosis not present

## 2020-05-31 DIAGNOSIS — I10 Essential (primary) hypertension: Secondary | ICD-10-CM

## 2020-05-31 DIAGNOSIS — E785 Hyperlipidemia, unspecified: Secondary | ICD-10-CM

## 2020-05-31 DIAGNOSIS — M5430 Sciatica, unspecified side: Secondary | ICD-10-CM

## 2020-05-31 LAB — LIPID PANEL
Chol/HDL Ratio: 2.9 ratio (ref 0.0–4.4)
Cholesterol, Total: 144 mg/dL (ref 100–199)
HDL: 50 mg/dL (ref >39)
LDL Chol Calc (NIH): 75 mg/dL (ref 0–99)
Triglycerides: 102 mg/dL (ref 0–149)
VLDL Cholesterol Cal: 19 mg/dL (ref 5–40)

## 2020-05-31 LAB — CBC WITH DIFFERENTIAL/PLATELET
Basophils Absolute: 0 10*3/uL (ref 0.0–0.2)
Basos: 1 %
EOS (ABSOLUTE): 0.2 10*3/uL (ref 0.0–0.4)
Eos: 3 %
Hematocrit: 44.4 % (ref 34.0–46.6)
Hemoglobin: 15 g/dL (ref 11.1–15.9)
Immature Grans (Abs): 0 10*3/uL (ref 0.0–0.1)
Immature Granulocytes: 1 %
Lymphocytes Absolute: 1.7 10*3/uL (ref 0.7–3.1)
Lymphs: 26 %
MCH: 30.2 pg (ref 26.6–33.0)
MCHC: 33.8 g/dL (ref 31.5–35.7)
MCV: 90 fL (ref 79–97)
Monocytes Absolute: 0.5 10*3/uL (ref 0.1–0.9)
Monocytes: 8 %
Neutrophils Absolute: 3.9 10*3/uL (ref 1.4–7.0)
Neutrophils: 61 %
Platelets: 325 10*3/uL (ref 150–450)
RBC: 4.96 x10E6/uL (ref 3.77–5.28)
RDW: 12.1 % (ref 11.7–15.4)
WBC: 6.3 10*3/uL (ref 3.4–10.8)

## 2020-05-31 LAB — COMPREHENSIVE METABOLIC PANEL
ALT: 27 IU/L (ref 0–32)
AST: 20 IU/L (ref 0–40)
Albumin/Globulin Ratio: 1.7 (ref 1.2–2.2)
Albumin: 4.3 g/dL (ref 3.8–4.9)
Alkaline Phosphatase: 67 IU/L (ref 44–121)
BUN/Creatinine Ratio: 13 (ref 9–23)
BUN: 10 mg/dL (ref 6–24)
Bilirubin Total: 0.8 mg/dL (ref 0.0–1.2)
CO2: 22 mmol/L (ref 20–29)
Calcium: 9.5 mg/dL (ref 8.7–10.2)
Chloride: 101 mmol/L (ref 96–106)
Creatinine, Ser: 0.77 mg/dL (ref 0.57–1.00)
GFR calc Af Amer: 103 mL/min/{1.73_m2} (ref >59)
GFR calc non Af Amer: 90 mL/min/{1.73_m2} (ref >59)
Globulin, Total: 2.5 g/dL (ref 1.5–4.5)
Glucose: 155 mg/dL — ABNORMAL HIGH (ref 65–99)
Potassium: 4.8 mmol/L (ref 3.5–5.2)
Sodium: 139 mmol/L (ref 134–144)
Total Protein: 6.8 g/dL (ref 6.0–8.5)

## 2020-05-31 LAB — BAYER DCA HB A1C WAIVED: HB A1C (BAYER DCA - WAIVED): 7.2 % — ABNORMAL HIGH (ref <7.0)

## 2020-05-31 MED ORDER — CYCLOBENZAPRINE HCL 5 MG PO TABS: 5.0000 mg | ORAL_TABLET | Freq: Three times a day (TID) | ORAL | 0 refills | Status: DC | PRN

## 2020-05-31 MED ORDER — PREDNISONE 10 MG (21) PO TBPK: ORAL_TABLET | ORAL | 0 refills | Status: DC

## 2020-05-31 MED ORDER — METFORMIN HCL 500 MG PO TABS: 500.0000 mg | ORAL_TABLET | Freq: Two times a day (BID) | ORAL | 0 refills | Status: DC

## 2020-05-31 NOTE — Assessment & Plan Note (Signed)
Essential hypertension well-controlled on current medication. Lisinopril 2.5 mg tablet 1 to 2 tablets by mouth daily. No changes to current medication dose. Continue on low-sodium healthy diet and exercise as tolerated. Continue to provide education with printed handouts given. Labs completed. Follow-up in 3 months.

## 2020-05-31 NOTE — Progress Notes (Signed)
Established Patient Office Visit  Subjective:  Patient ID: Nicole Santos, female    DOB: 1970/06/20  Age: 49 y.o. MRN: [id number]  CC:  Chief Complaint  Patient presents with   Medical Management of Chronic Issues    HPI Nicole Santos is a 49 year old female who presents  for follow up of hypertension. Patient was diagnosed in 07/04/2016. The patient is tolerating the medication well without side effects. Compliance with treatment has been good; including taking medication as directed , maintains a healthy diet and regular exercise regimen , and following up as directed. Current medication lisinopril 2.5 mg tablet 1 to 2 tablets by mouth daily.  Mixed hyperlipidemia  Pt presents with hyperlipidemia. Patient was diagnosed in 10/03/2016. Compliance with treatment has been good; The patient is compliant with medications, maintains a low cholesterol diet , follows up as directed , and maintains an exercise regimen . The patient denies experiencing any hypercholesterolemia related symptoms. Current medication atorvastatin 20 mg tablet by mouth daily.  The patient presents with history of type II diabetes mellitus without complications. Patient was diagnosed in 10/03/2016. Compliance with treatment has been good; the patient takes medication as directed , maintains a diabetic diet and an exercise regimen , follows up as directed , and is keeping a glucose diary. Patient did not bring a blood pressure log today but reports blood sugar has been stable. Patient specifically denies associated symptoms, including blurred vision, fatigue, polydipsia, polyphagia and polyuria . Patient denies hypoglycemia. In regard to preventative care, the patient performs foot self-exams daily and last ophthalmology exam was in; we'll schedule an appointment.   Pain  She reports recurrent bilateral lower back pain. was not an injury that may have caused the pain. The pain started a few weeks ago and is worsening. The pain does  radiate bilateral lower leg. The pain is described as aching and pinching, is mild in intensity, occurring intermittently. Symptoms are worse in the: morning, mid-day, afternoon  Aggravating factors: bending backwards and bending forwards Relieving factors: Resting .  She has tried NSAIDs with little relief.   ---------------------------------------------------------------------------------------------------   Past Medical History:  Diagnosis Date   Acute respiratory disease due to COVID-19 virus 02/02/2019   Cancer ([place])    Diabetes mellitus without complication ([place])    Hypertension     Past Surgical History:  Procedure Laterality Date   KNEE SURGERY Right    Arthroscopic   TONSILLECTOMY AND ADENOIDECTOMY      Family History  Problem Relation Age of Onset   Hypertension Mother     Social History   Socioeconomic History   Marital status: Single    Spouse name: Not on file   Number of children: Not on file   Years of education: Not on file   Highest education level: Not on file  Occupational History   Not on file  Tobacco Use   Smoking status: Never Smoker   Smokeless tobacco: Never Used  Vaping Use   Vaping Use: Never used  Substance and Sexual Activity   Alcohol use: No   Drug use: No   Sexual activity: Not on file  Other Topics Concern   Not on file  Social History Narrative   Not on file   Social Determinants of Health   Financial Resource Strain: Not on file  Food Insecurity: Not on file  Transportation Needs: Not on file  Physical Activity: Not on file  Stress: Not on file  Social Connections: Not on file  Intimate Partner Violence: Not on file    Outpatient Medications Prior to Visit  Medication Sig Dispense Refill   aspirin EC 81 MG tablet Take 81 mg by mouth daily.     atorvastatin (LIPITOR) 20 MG tablet Take 1 tablet (20 mg total) by mouth daily. 90 tablet 1   cimetidine (TAGAMET) 200 MG tablet Take 200 mg by mouth as  needed.      ibuprofen (ADVIL) 200 MG tablet Take 200 mg by mouth every 6 (six) hours as needed for mild pain or moderate pain.     lisinopril (ZESTRIL) 2.5 MG tablet Take 1-2 tablets (2.5-5 mg total) by mouth daily. 180 tablet 1   metFORMIN (GLUCOPHAGE) 500 MG tablet Take 1 tablet (500 mg total) by mouth daily. 90 tablet 1   No facility-administered medications prior to visit.    Allergies  Allergen Reactions   Augmentin [Amoxicillin-Pot Clavulanate] Diarrhea and Nausea And Vomiting   Doxycycline     Abdominal cramps.   Topamax [Topiramate] Nausea And Vomiting    ROS Review of Systems  Musculoskeletal: Positive for back pain.  All other systems reviewed and are negative.     Objective:    Physical Exam Vitals reviewed.  Constitutional:      General: She is awake.     Appearance: Normal appearance. She is well-developed. She is obese.  HENT:     Nose: Nose normal.  Eyes:     Conjunctiva/sclera: Conjunctivae normal.  Cardiovascular:     Rate and Rhythm: Normal rate and regular rhythm.     Pulses: Normal pulses.     Heart sounds: Normal heart sounds.  Abdominal:     General: Bowel sounds are normal.  Musculoskeletal:        General: Tenderness present.     Cervical back: Normal range of motion.  Skin:    General: Skin is warm.  Neurological:     General: No focal deficit present.     Mental Status: She is alert and oriented to person, place, and time.  Psychiatric:        Mood and Affect: Mood normal.        Behavior: Behavior normal. Behavior is cooperative.     BP 130/85    Pulse (!) 110    Temp (!) 96.6 F (35.9 C)    Ht 5\' 2"  (1.575 m)    Wt 277 lb 9.6 oz (125.9 kg)    SpO2 97%    BMI 50.77 kg/m  Wt Readings from Last 3 Encounters:  05/31/20 277 lb 9.6 oz (125.9 kg)  02/19/20 279 lb (126.6 kg)  08/19/19 267 lb 6.4 oz (121.3 kg)     Health Maintenance Due  Topic Date Due   PNEUMOCOCCAL POLYSACCHARIDE VACCINE AGE 25-64 HIGH RISK  Never done    OPHTHALMOLOGY EXAM  Never done   COVID-19 Vaccine (1) Never done   TETANUS/TDAP  Never done   PAP SMEAR-Modifier  Never done   COLONOSCOPY (Pts 45-41yrs Insurance coverage will need to be confirmed)  Never done   MAMMOGRAM  11/29/2018    There are no preventive care reminders to display for this patient.  Lab Results  Component Value Date   TSH 0.949 02/19/2020   Lab Results  Component Value Date   WBC 5.1 08/19/2019   HGB 14.1 08/19/2019   HCT 41.6 08/19/2019   MCV 88 08/19/2019   PLT 319 08/19/2019   Lab Results  Component Value Date   NA 138 02/19/2020  K 4.4 02/19/2020   CO2 21 02/19/2020   GLUCOSE 167 (H) 02/19/2020   BUN 14 02/19/2020   CREATININE 0.79 02/19/2020   BILITOT 0.7 02/19/2020   ALKPHOS 66 02/19/2020   AST 16 02/19/2020   ALT 23 02/19/2020   PROT 6.7 02/19/2020   ALBUMIN 4.4 02/19/2020   CALCIUM 9.3 02/19/2020   ANIONGAP 12 02/08/2019   Lab Results  Component Value Date   CHOL 153 08/19/2019   Lab Results  Component Value Date   HDL 54 08/19/2019   Lab Results  Component Value Date   LDLCALC 84 08/19/2019   Lab Results  Component Value Date   TRIG 75 08/19/2019   Lab Results  Component Value Date   CHOLHDL 2.8 08/19/2019   Lab Results  Component Value Date   HGBA1C 6.9 02/19/2020      Assessment & Plan:   Problem List Items Addressed This Visit      Cardiovascular and Mediastinum   Essential hypertension    Essential hypertension well-controlled on current medication. Lisinopril 2.5 mg tablet 1 to 2 tablets by mouth daily. No changes to current medication dose. Continue on low-sodium healthy diet and exercise as tolerated. Continue to provide education with printed handouts given. Labs completed. Follow-up in 3 months.         Endocrine   Diabetes mellitus without complication ([place]) - Primary    Type 2 diabetes mellitus without complication is well managed on Metformin 500 mg tablet by mouth daily. Completed  patient's foot exams, A1c completed, provided education to patient with printed handouts given. Continue on diabetic diet and exercise regimen as tolerated. Follow-up in 3 months.      Relevant Orders   CBC with Differential   Bayer DCA Hb A1c Waived   Comprehensive metabolic panel   Hyperlipidemia associated with type 2 diabetes mellitus ([place])    Hyperlipidemia associated with type 2 diabetes mellitus is well controlled on current medication. Atorvastatin 20 mg tablet by mouth daily. No changes to current dose. Provided education to patient with printed handouts given. Continue on low-cholesterol diet and exercise as tolerated. Follow-up in 3 months.      Relevant Orders   Lipid Panel     Nervous and Auditory   Back pain with sciatica    Back pain with sciatica not well controlled. Patient is reporting new pain bilateral lower back radiating to bilateral back thighs. Patient describes intermittent pain as 2 out of 3, a pain scale of 0-10. This is not new for patient as patient had similar occurrence in the past few months. Provided education to patient on back strengthening exercises, started patient on Flexeril 5 mg tablet as needed for back spasms, prednisone taper, ibuprofen.  Medication sent to pharmacy  Follow-up with worsening or unresolved symptoms.      Relevant Medications   predniSONE (STERAPRED UNI-PAK 21 TAB) 10 MG (21) TBPK tablet   cyclobenzaprine (FLEXERIL) 5 MG tablet      Meds ordered this encounter  Medications   predniSONE (STERAPRED UNI-PAK 21 TAB) 10 MG (21) TBPK tablet    Sig: 6 tablet by mouth day 1, 5 tablet day 2, 4 tablet day 3, 3 tablet day 4, 2 tablet day 5, 1 tablet day 6.    Dispense:  1 each    Refill:  0    Order Specific Question:   Supervising Provider    Answer:   Janora Norlander [id number]   cyclobenzaprine (FLEXERIL) 5 MG tablet  Sig: Take 1 tablet (5 mg total) by mouth 3 (three) times daily as needed for muscle spasms.    Dispense:   30 tablet    Refill:  0    Order Specific Question:   Supervising Provider    Answer:   Janora Norlander [id number]    Follow-up: Return in about 3 months (around 08/29/2020).    Ivy Lynn, NP

## 2020-05-31 NOTE — Assessment & Plan Note (Signed)
Hyperlipidemia associated with type 2 diabetes mellitus is well controlled on current medication. Atorvastatin 20 mg tablet by mouth daily. No changes to current dose. Provided education to patient with printed handouts given. Continue on low-cholesterol diet and exercise as tolerated. Follow-up in 3 months.

## 2020-05-31 NOTE — Assessment & Plan Note (Signed)
Type 2 diabetes mellitus without complication is well managed on Metformin 500 mg tablet by mouth daily. Completed patient's foot exams, A1c completed, provided education to patient with printed handouts given. Continue on diabetic diet and exercise regimen as tolerated. Follow-up in 3 months.

## 2020-05-31 NOTE — Patient Instructions (Addendum)
Cholesterol Content in Foods Cholesterol is a waxy, fat-like substance that helps to carry fat in the blood. The body needs cholesterol in small amounts, but too much cholesterol can cause damage to the arteries and heart. Most people should eat less than 200 milligrams (mg) of cholesterol a day. Foods with cholesterol  Cholesterol is found in animal-based foods, such as meat, seafood, and dairy. Generally, low-fat dairy and lean meats have less cholesterol than full-fat dairy and fatty meats. The milligrams of cholesterol per serving (mg per serving) of common cholesterol-containing foods are listed below. Meat and other proteins  Egg -- one large whole egg has 186 mg.  Veal shank -- 4 oz has 141 mg.  Lean ground turkey (93% lean) -- 4 oz has 118 mg.  Fat-trimmed lamb loin -- 4 oz has 106 mg.  Lean ground beef (90% lean) -- 4 oz has 100 mg.  Lobster -- 3.5 oz has 90 mg.  Pork loin chops -- 4 oz has 86 mg.  Canned salmon -- 3.5 oz has 83 mg.  Fat-trimmed beef top loin -- 4 oz has 78 mg.  Frankfurter -- 1 frank (3.5 oz) has 77 mg.  Crab -- 3.5 oz has 71 mg.  Roasted chicken without skin, white meat -- 4 oz has 66 mg.  Light bologna -- 2 oz has 45 mg.  Deli-cut turkey -- 2 oz has 31 mg.  Canned tuna -- 3.5 oz has 31 mg.  Bacon -- 1 oz has 29 mg.  Oysters and mussels (raw) -- 3.5 oz has 25 mg.  Mackerel -- 1 oz has 22 mg.  Trout -- 1 oz has 20 mg.  Pork sausage -- 1 link (1 oz) has 17 mg.  Salmon -- 1 oz has 16 mg.  Tilapia -- 1 oz has 14 mg. Dairy  Soft-serve ice cream --  cup (4 oz) has 103 mg.  Whole-milk yogurt -- 1 cup (8 oz) has 29 mg.  Cheddar cheese -- 1 oz has 28 mg.  American cheese -- 1 oz has 28 mg.  Whole milk -- 1 cup (8 oz) has 23 mg.  2% milk -- 1 cup (8 oz) has 18 mg.  Cream cheese -- 1 tablespoon (Tbsp) has 15 mg.  Cottage cheese --  cup (4 oz) has 14 mg.  Low-fat (1%) milk -- 1 cup (8 oz) has 10 mg.  Sour cream -- 1 Tbsp has 8.5  mg.  Low-fat yogurt -- 1 cup (8 oz) has 8 mg.  Nonfat Greek yogurt -- 1 cup (8 oz) has 7 mg.  Half-and-half cream -- 1 Tbsp has 5 mg. Fats and oils  Cod liver oil -- 1 tablespoon (Tbsp) has 82 mg.  Butter -- 1 Tbsp has 15 mg.  Lard -- 1 Tbsp has 14 mg.  Bacon grease -- 1 Tbsp has 14 mg.  Mayonnaise -- 1 Tbsp has 5-10 mg.  Margarine -- 1 Tbsp has 3-10 mg. Exact amounts of cholesterol in these foods may vary depending on specific ingredients and brands. Foods without cholesterol Most plant-based foods do not have cholesterol unless you combine them with a food that has cholesterol. Foods without cholesterol include:  Grains and cereals.  Vegetables.  Fruits.  Vegetable oils, such as olive, canola, and sunflower oil.  Legumes, such as peas, beans, and lentils.  Nuts and seeds.  Egg whites. Summary  The body needs cholesterol in small amounts, but too much cholesterol can cause damage to the arteries and heart.    Most people should eat less than 200 milligrams (mg) of cholesterol a day. This information is not intended to replace advice given to you by your health care provider. Make sure you discuss any questions you have with your health care provider. Document Revised: 05/03/2017 Document Reviewed: 01/15/2017 Elsevier Patient Education  2020 [employer] Diabetes Basics  Diabetes (diabetes mellitus) is a long-term (chronic) disease. It occurs when the body does not properly use sugar (glucose) that is released from food after you eat. Diabetes may be caused by one or both of these problems:  Your pancreas does not make enough of a hormone called insulin.  Your body does not react in a normal way to insulin that it makes. Insulin lets sugars (glucose) go into cells in your body. This gives you energy. If you have diabetes, sugars cannot get into cells. This causes high blood sugar (hyperglycemia). Follow these instructions at home: How is diabetes treated? You  may need to take insulin or other diabetes medicines daily to keep your blood sugar in balance. Take your diabetes medicines every day as told by your doctor. List your diabetes medicines here: Diabetes medicines  Name of medicine: ______________________________ ? Amount (dose): _______________ Time (a.m./p.m.): _______________ Notes: ___________________________________  Name of medicine: ______________________________ ? Amount (dose): _______________ Time (a.m./p.m.): _______________ Notes: ___________________________________  Name of medicine: ______________________________ ? Amount (dose): _______________ Time (a.m./p.m.): _______________ Notes: ___________________________________ If you use insulin, you will learn how to give yourself insulin by injection. You may need to adjust the amount based on the food that you eat. List the types of insulin you use here: Insulin  Insulin type: ______________________________ ? Amount (dose): _______________ Time (a.m./p.m.): _______________ Notes: ___________________________________  Insulin type: ______________________________ ? Amount (dose): _______________ Time (a.m./p.m.): _______________ Notes: ___________________________________  Insulin type: ______________________________ ? Amount (dose): _______________ Time (a.m./p.m.): _______________ Notes: ___________________________________  Insulin type: ______________________________ ? Amount (dose): _______________ Time (a.m./p.m.): _______________ Notes: ___________________________________  Insulin type: ______________________________ ? Amount (dose): _______________ Time (a.m./p.m.): _______________ Notes: ___________________________________ How do I manage my blood sugar?  Check your blood sugar levels using a blood glucose monitor as directed by your doctor. Your doctor will set treatment goals for you. Generally, you should have these blood sugar levels:  Before meals (preprandial):  80-130 mg/dL (3.7-6.2 mmol/L).  After meals (postprandial): below 180 mg/dL (10 mmol/L).  A1c level: less than 7%. Write down the times that you will check your blood sugar levels: Blood sugar checks  Time: _______________ Notes: ___________________________________  Time: _______________ Notes: ___________________________________  Time: _______________ Notes: ___________________________________  Time: _______________ Notes: ___________________________________  Time: _______________ Notes: ___________________________________  Time: _______________ Notes: ___________________________________  What do I need to know about low blood sugar? Low blood sugar is called hypoglycemia. This is when blood sugar is at or below 70 mg/dL (3.9 mmol/L). Symptoms may include:  Feeling: ? Hungry. ? Worried or nervous (anxious). ? Sweaty and clammy. ? Confused. ? Dizzy. ? Sleepy. ? Sick to your stomach (nauseous).  Having: ? A fast heartbeat. ? A headache. ? A change in your vision. ? Tingling or no feeling (numbness) around the mouth, lips, or tongue. ? Jerky movements that you cannot control (seizure).  Having trouble with: ? Moving (coordination). ? Sleeping. ? Passing out (fainting). ? Getting upset easily (irritability). Treating low blood sugar To treat low blood sugar, eat or drink something sugary right away. If you can think clearly and swallow safely, follow the 15:15 rule:  Take 15 grams of a fast-acting carb (carbohydrate). Talk with  your doctor about how much you should take.  Some fast-acting carbs are: ? Sugar tablets (glucose pills). Take 3-4 glucose pills. ? 6-8 pieces of hard candy. ? 4-6 oz (120-150 mL) of fruit juice. ? 4-6 oz (120-150 mL) of regular (not diet) soda. ? 1 Tbsp (15 mL) honey or sugar.  Check your blood sugar 15 minutes after you take the carb.  If your blood sugar is still at or below 70 mg/dL (3.9 mmol/L), take 15 grams of a carb again.  If  your blood sugar does not go above 70 mg/dL (3.9 mmol/L) after 3 tries, get help right away.  After your blood sugar goes back to normal, eat a meal or a snack within 1 hour. Treating very low blood sugar If your blood sugar is at or below 54 mg/dL (3 mmol/L), you have very low blood sugar (severe hypoglycemia). This is an emergency. Do not wait to see if the symptoms will go away. Get medical help right away. Call your local emergency services (911 in the [place] Do not drive yourself to the hospital. Questions to ask your health care provider  Do I need to meet with a diabetes educator?  What equipment will I need to care for myself at home?  What diabetes medicines do I need? When should I take them?  How often do I need to check my blood sugar?  What number can I call if I have questions?  When is my next doctor's visit?  Where can I find a support group for people with diabetes? Where to find more information  American Diabetes Association: www.diabetes.org  American Association of Diabetes Educators: www.diabeteseducator.org/patient-resources Contact a doctor if:  Your blood sugar is at or above 240 mg/dL (13.3 mmol/L) for 2 days in a row.  You have been sick or have had a fever for 2 days or more, and you are not getting better.  You have any of these problems for more than 6 hours: ? You cannot eat or drink. ? You feel sick to your stomach (nauseous). ? You throw up (vomit). ? You have watery poop (diarrhea). Get help right away if:  Your blood sugar is lower than 54 mg/dL (3 mmol/L).  You get confused.  You have trouble: ? Thinking clearly. ? Breathing. Summary  Diabetes (diabetes mellitus) is a long-term (chronic) disease. It occurs when the body does not properly use sugar (glucose) that is released from food after digestion.  Take insulin and diabetes medicines as told.  Check your blood sugar every day, as often as told.  Keep all follow-up visits as  told by your doctor. This is important. This information is not intended to replace advice given to you by your health care provider. Make sure you discuss any questions you have with your health care provider. Document Revised: 02/11/2019 Document Reviewed: 08/23/2017 Elsevier Patient Education  2020 [employer]. Hypertension, Adult Hypertension is another name for high blood pressure. High blood pressure forces your heart to work harder to pump blood. This can cause problems over time. There are two numbers in a blood pressure reading. There is a top number (systolic) over a bottom number (diastolic). It is best to have a blood pressure that is below 120/80. Healthy choices can help lower your blood pressure, or you may need medicine to help lower it. What are the causes? The cause of this condition is not known. Some conditions may be related to high blood pressure. What increases the risk?  Smoking.  Having type 2 diabetes mellitus, high cholesterol, or both.  Not getting enough exercise or physical activity.  Being overweight.  Having too much fat, sugar, calories, or salt (sodium) in your diet.  Drinking too much alcohol.  Having long-term (chronic) kidney disease.  Having a family history of high blood pressure.  Age. Risk increases with age.  Race. You may be at higher risk if you are African American.  Gender. Men are at higher risk than women before age 40. After age 65, women are at higher risk than men.  Having obstructive sleep apnea.  Stress. What are the signs or symptoms?  High blood pressure may not cause symptoms. Very high blood pressure (hypertensive crisis) may cause: ? Headache. ? Feelings of worry or nervousness (anxiety). ? Shortness of breath. ? Nosebleed. ? A feeling of being sick to your stomach (nausea). ? Throwing up (vomiting). ? Changes in how you see. ? Very bad chest pain. ? Seizures. How is this treated?  This condition is treated  by making healthy lifestyle changes, such as: ? Eating healthy foods. ? Exercising more. ? Drinking less alcohol.  Your health care provider may prescribe medicine if lifestyle changes are not enough to get your blood pressure under control, and if: ? Your top number is above 130. ? Your bottom number is above 80.  Your personal target blood pressure may vary. Follow these instructions at home: Eating and drinking   If told, follow the DASH eating plan. To follow this plan: ? Fill one half of your plate at each meal with fruits and vegetables. ? Fill one fourth of your plate at each meal with whole grains. Whole grains include whole-wheat pasta, brown rice, and whole-grain bread. ? Eat or drink low-fat dairy products, such as skim milk or low-fat yogurt. ? Fill one fourth of your plate at each meal with low-fat (lean) proteins. Low-fat proteins include fish, chicken without skin, eggs, beans, and tofu. ? Avoid fatty meat, cured and processed meat, or chicken with skin. ? Avoid pre-made or processed food.  Eat less than 1,500 mg of salt each day.  Do not drink alcohol if: ? Your doctor tells you not to drink. ? You are pregnant, may be pregnant, or are planning to become pregnant.  If you drink alcohol: ? Limit how much you use to:  0-1 drink a day for women.  0-2 drinks a day for men. ? Be aware of how much alcohol is in your drink. In the [place] one drink equals one 12 oz bottle of beer (355 mL), one 5 oz glass of wine (148 mL), or one 1 oz glass of hard liquor (44 mL). Lifestyle   Work with your doctor to stay at a healthy weight or to lose weight. Ask your doctor what the best weight is for you.  Get at least 30 minutes of exercise most days of the week. This may include walking, swimming, or biking.  Get at least 30 minutes of exercise that strengthens your muscles (resistance exercise) at least 3 days a week. This may include lifting weights or doing Pilates.  Do not  use any products that contain nicotine or tobacco, such as cigarettes, e-cigarettes, and chewing tobacco. If you need help quitting, ask your doctor.  Check your blood pressure at home as told by your doctor.  Keep all follow-up visits as told by your doctor. This is important. Medicines  Take over-the-counter and prescription medicines only as told by your  doctor. Follow directions carefully.  Do not skip doses of blood pressure medicine. The medicine does not work as well if you skip doses. Skipping doses also puts you at risk for problems.  Ask your doctor about side effects or reactions to medicines that you should watch for. Contact a doctor if you:  Think you are having a reaction to the medicine you are taking.  Have headaches that keep coming back (recurring).  Feel dizzy.  Have swelling in your ankles.  Have trouble with your vision. Get help right away if you:  Get a very bad headache.  Start to feel mixed up (confused).  Feel weak or numb.  Feel faint.  Have very bad pain in your: ? Chest. ? Belly (abdomen).  Throw up more than once.  Have trouble breathing. Summary  Hypertension is another name for high blood pressure.  High blood pressure forces your heart to work harder to pump blood.  For most people, a normal blood pressure is less than 120/80.  Making healthy choices can help lower blood pressure. If your blood pressure does not get lower with healthy choices, you may need to take medicine. This information is not intended to replace advice given to you by your health care provider. Make sure you discuss any questions you have with your health care provider. Document Revised: 01/29/2018 Document Reviewed: 01/29/2018 Elsevier Patient Education  2020 Else[employer] Inc.  Acute Back Pain, Adult Acute back pain is sudden and usually short-lived. It is often caused by an injury to the muscles and tissues in the back. The injury may result from: A muscle  or ligament getting overstretched or torn (strained). Ligaments are tissues that connect bones to each other. Lifting something improperly can cause a back strain. Wear and tear (degeneration) of the spinal disks. Spinal disks are circular tissue that provides cushioning between the bones of the spine (vertebrae). Twisting motions, such as while playing sports or doing yard work. A hit to the back. Arthritis. You may have a physical exam, lab tests, and imaging tests to find the cause of your pain. Acute back pain usually goes away with rest and home care. Follow these instructions at home: Managing pain, stiffness, and swelling Take over-the-counter and prescription medicines only as told by your health care provider. Your health care provider may recommend applying ice during the first 24-48 hours after your pain starts. To do this: Put ice in a plastic bag. Place a towel between your skin and the bag. Leave the ice on for 20 minutes, 2-3 times a day. If directed, apply heat to the affected area as often as told by your health care provider. Use the heat source that your health care provider recommends, such as a moist heat pack or a heating pad. Place a towel between your skin and the heat source. Leave the heat on for 20-30 minutes. Remove the heat if your skin turns bright red. This is especially important if you are unable to feel pain, heat, or cold. You have a greater risk of getting burned. Activity  Do not stay in bed. Staying in bed for more than 1-2 days can delay your recovery. Sit up and stand up straight. Avoid leaning forward when you sit, or hunching over when you stand. If you work at a desk, sit close to it so you do not need to lean over. Keep your chin tucked in. Keep your neck drawn back, and keep your elbows bent at a right angle. Your  arms should look like the letter "L." Sit high and close to the steering wheel when you drive. Add lower back (lumbar) support to your car  seat, if needed. Take short walks on even surfaces as soon as you are able. Try to increase the length of time you walk each day. Do not sit, drive, or stand in one place for more than 30 minutes at a time. Sitting or standing for long periods of time can put stress on your back. Do not drive or use heavy machinery while taking prescription pain medicine. Use proper lifting techniques. When you bend and lift, use positions that put less stress on your back: [place] your knees. Keep the load close to your body. Avoid twisting. Exercise regularly as told by your health care provider. Exercising helps your back heal faster and helps prevent back injuries by keeping muscles strong and flexible. Work with a physical therapist to make a safe exercise program, as recommended by your health care provider. Do any exercises as told by your physical therapist. Lifestyle Maintain a healthy weight. Extra weight puts stress on your back and makes it difficult to have good posture. Avoid activities or situations that make you feel anxious or stressed. Stress and anxiety increase muscle tension and can make back pain worse. Learn ways to manage anxiety and stress, such as through exercise. General instructions Sleep on a firm mattress in a comfortable position. Try lying on your side with your knees slightly bent. If you lie on your back, put a pillow under your knees. Follow your treatment plan as told by your health care provider. This may include: Cognitive or behavioral therapy. Acupuncture or massage therapy. Meditation or yoga. Contact a health care provider if: You have pain that is not relieved with rest or medicine. You have increasing pain going down into your legs or buttocks. Your pain does not improve after 2 weeks. You have pain at night. You lose weight without trying. You have a fever or chills. Get help right away if: You develop new bowel or bladder control problems. You have unusual  weakness or numbness in your arms or legs. You develop nausea or vomiting. You develop abdominal pain. You feel faint. Summary Acute back pain is sudden and usually short-lived. Use proper lifting techniques. When you bend and lift, use positions that put less stress on your back. Take over-the-counter and prescription medicines and apply heat or ice as directed by your health care provider. This information is not intended to replace advice given to you by your health care provider. Make sure you discuss any questions you have with your health care provider. Document Revised: 09/09/2018 Document Reviewed: 01/02/2017 Elsevier Patient Education  2020 [employer].

## 2020-05-31 NOTE — Assessment & Plan Note (Signed)
Back pain with sciatica not well controlled. Patient is reporting new pain bilateral lower back radiating to bilateral back thighs. Patient describes intermittent pain as 2 out of 3, a pain scale of 0-10. This is not new for patient as patient had similar occurrence in the past few months. Provided education to patient on back strengthening exercises, started patient on Flexeril 5 mg tablet as needed for back spasms, prednisone taper, ibuprofen.  Medication sent to pharmacy  Follow-up with worsening or unresolved symptoms.

## 2020-08-30 ENCOUNTER — Other Ambulatory Visit: Payer: Self-pay

## 2020-08-30 ENCOUNTER — Encounter: Payer: Self-pay | Admitting: Family Medicine

## 2020-08-30 ENCOUNTER — Ambulatory Visit: Payer: Commercial Managed Care - PPO | Admitting: Family Medicine

## 2020-08-30 VITALS — BP 132/83 | HR 93 | Temp 97.5°F | Ht 62.0 in | Wt 272.2 lb

## 2020-08-30 DIAGNOSIS — E785 Hyperlipidemia, unspecified: Secondary | ICD-10-CM

## 2020-08-30 DIAGNOSIS — E119 Type 2 diabetes mellitus without complications: Secondary | ICD-10-CM

## 2020-08-30 DIAGNOSIS — I10 Essential (primary) hypertension: Secondary | ICD-10-CM

## 2020-08-30 DIAGNOSIS — Z0000 Encounter for general adult medical examination without abnormal findings: Secondary | ICD-10-CM

## 2020-08-30 DIAGNOSIS — E1169 Type 2 diabetes mellitus with other specified complication: Secondary | ICD-10-CM

## 2020-08-30 LAB — BAYER DCA HB A1C WAIVED: HB A1C (BAYER DCA - WAIVED): 6.7 % (ref <7.0)

## 2020-08-30 MED ORDER — LISINOPRIL 2.5 MG PO TABS: 2.5000 mg | ORAL_TABLET | Freq: Every day | ORAL | 1 refills | Status: DC

## 2020-08-30 MED ORDER — METFORMIN HCL 500 MG PO TABS: 1000.0000 mg | ORAL_TABLET | Freq: Every day | ORAL | 1 refills | Status: DC

## 2020-08-30 MED ORDER — ATORVASTATIN CALCIUM 20 MG PO TABS: 20.0000 mg | ORAL_TABLET | Freq: Every day | ORAL | 1 refills | Status: DC

## 2020-08-30 NOTE — Patient Instructions (Signed)
Please let me know via MyChart if you are agreeable to cologuard.

## 2020-08-30 NOTE — Progress Notes (Signed)
Assessment & Plan:  1. Diabetes mellitus without complication ([place]) Lab Results  Component Value Date   HGBA1C 7.2 (H) 05/31/2020   HGBA1C 6.9 02/19/2020   HGBA1C 6.7 08/19/2019  A1c 7.2 previously, down to 6.7 today.  - Diabetes is at goal of A1c < 7. - Medications: continue current medications, advised patient she can take two tablets of metformin together since she cannot remember the 2nd one all the time - Home glucose monitoring: continue monitoring - Patient is currently taking a statin. Patient is taking an ACE-inhibitor/ARB.  - Instruction/counseling given: reminded to get eye exam  Diabetes Health Maintenance Due  Topic Date Due  . OPHTHALMOLOGY EXAM  Never done  . HEMOGLOBIN A1C  11/29/2020  . FOOT EXAM  05/31/2021    Lab Results  Component Value Date   LABMICR 3.6 02/19/2020   LABMICR See below: 07/06/2019   - Bayer DCA Hb A1c Waived - lisinopril (ZESTRIL) 2.5 MG tablet; Take 1 tablet (2.5 mg total) by mouth daily.  Dispense: 90 tablet; Refill: 1 - atorvastatin (LIPITOR) 20 MG tablet; Take 1 tablet (20 mg total) by mouth daily.  Dispense: 90 tablet; Refill: 1  2. Hyperlipidemia associated with type 2 diabetes mellitus ([place]) Labs to assess. - Lipid panel - atorvastatin (LIPITOR) 20 MG tablet; Take 1 tablet (20 mg total) by mouth daily.  Dispense: 90 tablet; Refill: 1  3. Essential hypertension Well controlled on current regimen.  - CBC with Differential/Platelet - CMP14+EGFR - lisinopril (ZESTRIL) 2.5 MG tablet; Take 1 tablet (2.5 mg total) by mouth daily.  Dispense: 90 tablet; Refill: 1  4. Healthcare maintenance Patient declined colonoscopy, pap smear, PNA, COVID vaccines, and TDAP. She is thinking about cologuard. She will schedule her mammogram on the bus. She is going to schedule her eye exam at [facility].    Return in about 6 months (around 03/02/2021) for annual physical.  Hendricks Limes, MSN, APRN, FNP-C [facility]  Subjective:    Patient ID: Nicole Santos, female    DOB: 1970/08/30, 50 y.o.   MRN: [id number]  Patient Care Team: Loman Brooklyn, FNP as PCP - General (Family Medicine)   Chief Complaint:  Chief Complaint  Patient presents with  . Diabetes    3 month follow up of chronic medical conditions     HPI: Nicole Santos is a 50 y.o. female presenting on 08/30/2020 for Diabetes (3 month follow up of chronic medical conditions )  Diabetes: Patient presents for follow up of diabetes. Current symptoms include: hyperglycemia. Known diabetic complications: none. Medication compliance: sometimes forgets 2nd metformin. Current diet: trying to eat healthy but struggling with pasta. Current exercise: none. Home blood sugar records: BGs are running  consistent with Hgb A1C. Is she  on ACE inhibitor or angiotensin II receptor blocker? Yes. Is she on a statin? Yes.   New complaints: None  Social history:  Relevant past medical, surgical, family and social history reviewed and updated as indicated. Interim medical history since our last visit reviewed.  Allergies and medications reviewed and updated.  DATA REVIEWED: CHART IN EPIC  ROS: Negative unless specifically indicated above in HPI.    Current Outpatient Medications:  .  aspirin EC 81 MG tablet, Take 81 mg by mouth daily., Disp: , Rfl:  .  atorvastatin (LIPITOR) 20 MG tablet, Take 1 tablet (20 mg total) by mouth daily., Disp: 90 tablet, Rfl: 1 .  cimetidine (TAGAMET) 200 MG tablet, Take 200 mg by mouth  as needed. , Disp: , Rfl:  .  ibuprofen (ADVIL) 200 MG tablet, Take 200 mg by mouth every 6 (six) hours as needed for mild pain or moderate pain., Disp: , Rfl:  .  lisinopril (ZESTRIL) 2.5 MG tablet, Take 1-2 tablets (2.5-5 mg total) by mouth daily. (Patient taking differently: Take 2.5 mg by mouth daily.), Disp: 180 tablet, Rfl: 1 .  metFORMIN (GLUCOPHAGE) 500 MG tablet, Take 1 tablet (500 mg total) by mouth 2 (two) times daily with a  meal., Disp: 300 tablet, Rfl: 0   Allergies  Allergen Reactions  . Augmentin [Amoxicillin-Pot Clavulanate] Diarrhea and Nausea And Vomiting  . Doxycycline     Abdominal cramps.  . Topamax [Topiramate] Nausea And Vomiting   Past Medical History:  Diagnosis Date  . Acute respiratory disease due to COVID-19 virus 02/02/2019  . Cancer ([place])   . Diabetes mellitus without complication ([place])   . Hypertension     Past Surgical History:  Procedure Laterality Date  . KNEE SURGERY Right    Arthroscopic  . TONSILLECTOMY AND ADENOIDECTOMY      Social History   Socioeconomic History  . Marital status: Single    Spouse name: Not on file  . Number of children: Not on file  . Years of education: Not on file  . Highest education level: Not on file  Occupational History  . Not on file  Tobacco Use  . Smoking status: Never Smoker  . Smokeless tobacco: Never Used  Vaping Use  . Vaping Use: Never used  Substance and Sexual Activity  . Alcohol use: No  . Drug use: No  . Sexual activity: Not on file  Other Topics Concern  . Not on file  Social History Narrative  . Not on file   Social Determinants of Health   Financial Resource Strain: Not on file  Food Insecurity: Not on file  Transportation Needs: Not on file  Physical Activity: Not on file  Stress: Not on file  Social Connections: Not on file  Intimate Partner Violence: Not on file        Objective:    BP 132/83   Pulse 93   Temp (!) 97.5 F (36.4 C) (Temporal)   Ht _0  (1.575 m)   Wt 272 lb 3.2 oz (123.5 kg)   SpO2 99%   BMI 49.79 kg/m   Wt Readings from Last 3 Encounters:  08/30/20 272 lb 3.2 oz (123.5 kg)  05/31/20 277 lb 9.6 oz (125.9 kg)  02/19/20 279 lb (126.6 kg)    Physical Exam Vitals reviewed.  Constitutional:      General: She is not in acute distress.    Appearance: Normal appearance. She is morbidly obese. She is not ill-appearing, toxic-appearing or diaphoretic.  HENT:     Head:  Normocephalic and atraumatic.  Eyes:     General: No scleral icterus.       Right eye: No discharge.        Left eye: No discharge.     Conjunctiva/sclera: Conjunctivae normal.  Cardiovascular:     Rate and Rhythm: Normal rate and regular rhythm.     Heart sounds: Normal heart sounds. No murmur heard. No friction rub. No gallop.   Pulmonary:     Effort: Pulmonary effort is normal. No respiratory distress.     Breath sounds: Normal breath sounds. No stridor. No wheezing, rhonchi or rales.  Musculoskeletal:        General: Normal range of motion.  Cervical back: Normal range of motion.  Skin:    General: Skin is warm and dry.     Capillary Refill: Capillary refill takes less than 2 seconds.  Neurological:     General: No focal deficit present.     Mental Status: She is alert and oriented to person, place, and time. Mental status is at baseline.  Psychiatric:        Mood and Affect: Mood normal.        Behavior: Behavior normal.        Thought Content: Thought content normal.        Judgment: Judgment normal.     Lab Results  Component Value Date   TSH 0.949 02/19/2020   Lab Results  Component Value Date   WBC 6.3 05/31/2020   HGB 15.0 05/31/2020   HCT 44.4 05/31/2020   MCV 90 05/31/2020   PLT 325 05/31/2020   Lab Results  Component Value Date   NA 139 05/31/2020   K 4.8 05/31/2020   CO2 22 05/31/2020   GLUCOSE 155 (H) 05/31/2020   BUN 10 05/31/2020   CREATININE 0.77 05/31/2020   BILITOT 0.8 05/31/2020   ALKPHOS 67 05/31/2020   AST 20 05/31/2020   ALT 27 05/31/2020   PROT 6.8 05/31/2020   ALBUMIN 4.3 05/31/2020   CALCIUM 9.5 05/31/2020   ANIONGAP 12 02/08/2019   Lab Results  Component Value Date   CHOL 144 05/31/2020   Lab Results  Component Value Date   HDL 50 05/31/2020   Lab Results  Component Value Date   LDLCALC 75 05/31/2020   Lab Results  Component Value Date   TRIG 102 05/31/2020   Lab Results  Component Value Date   CHOLHDL 2.9  05/31/2020   Lab Results  Component Value Date   HGBA1C 7.2 (H) 05/31/2020

## 2020-08-31 LAB — LIPID PANEL
Chol/HDL Ratio: 2.7 ratio (ref 0.0–4.4)
Cholesterol, Total: 144 mg/dL (ref 100–199)
HDL: 53 mg/dL (ref >39)
LDL Chol Calc (NIH): 75 mg/dL (ref 0–99)
Triglycerides: 83 mg/dL (ref 0–149)
VLDL Cholesterol Cal: 16 mg/dL (ref 5–40)

## 2020-08-31 LAB — CMP14+EGFR
ALT: 21 IU/L (ref 0–32)
AST: 17 IU/L (ref 0–40)
Albumin/Globulin Ratio: 1.8 (ref 1.2–2.2)
Albumin: 4.2 g/dL (ref 3.8–4.9)
Alkaline Phosphatase: 65 IU/L (ref 44–121)
BUN/Creatinine Ratio: 17 (ref 9–23)
BUN: 14 mg/dL (ref 6–24)
Bilirubin Total: 0.6 mg/dL (ref 0.0–1.2)
CO2: 21 mmol/L (ref 20–29)
Calcium: 9.5 mg/dL (ref 8.7–10.2)
Chloride: 102 mmol/L (ref 96–106)
Creatinine, Ser: 0.81 mg/dL (ref 0.57–1.00)
Globulin, Total: 2.3 g/dL (ref 1.5–4.5)
Glucose: 148 mg/dL — ABNORMAL HIGH (ref 65–99)
Potassium: 5 mmol/L (ref 3.5–5.2)
Sodium: 140 mmol/L (ref 134–144)
Total Protein: 6.5 g/dL (ref 6.0–8.5)
eGFR: 88 mL/min/{1.73_m2} (ref >59)

## 2020-08-31 LAB — CBC WITH DIFFERENTIAL/PLATELET
Basophils Absolute: 0 10*3/uL (ref 0.0–0.2)
Basos: 1 %
EOS (ABSOLUTE): 0.2 10*3/uL (ref 0.0–0.4)
Eos: 3 %
Hematocrit: 43.3 % (ref 34.0–46.6)
Hemoglobin: 14.4 g/dL (ref 11.1–15.9)
Immature Grans (Abs): 0 10*3/uL (ref 0.0–0.1)
Immature Granulocytes: 0 %
Lymphocytes Absolute: 2.1 10*3/uL (ref 0.7–3.1)
Lymphs: 37 %
MCH: 29.6 pg (ref 26.6–33.0)
MCHC: 33.3 g/dL (ref 31.5–35.7)
MCV: 89 fL (ref 79–97)
Monocytes Absolute: 0.5 10*3/uL (ref 0.1–0.9)
Monocytes: 9 %
Neutrophils Absolute: 2.9 10*3/uL (ref 1.4–7.0)
Neutrophils: 50 %
Platelets: 313 10*3/uL (ref 150–450)
RBC: 4.86 x10E6/uL (ref 3.77–5.28)
RDW: 12.2 % (ref 11.7–15.4)
WBC: 5.8 10*3/uL (ref 3.4–10.8)

## 2020-09-05 ENCOUNTER — Ambulatory Visit: Payer: Commercial Managed Care - PPO | Admitting: Family Medicine

## 2020-09-05 DIAGNOSIS — J069 Acute upper respiratory infection, unspecified: Secondary | ICD-10-CM | POA: Diagnosis not present

## 2020-09-05 NOTE — Progress Notes (Signed)
   Virtual Visit  Note Due to COVID-19 pandemic this visit was conducted virtually. This visit type was conducted due to national recommendations for restrictions regarding the COVID-19 Pandemic (e.g. social distancing, sheltering in place) in an effort to limit this patient's exposure and mitigate transmission in our community. All issues noted in this document were discussed and addressed.  A physical exam was not performed with this format.  I connected with Nicole Santos on 09/05/20 at 1239 by phone and verified that I am speaking with the correct person using two identifiers. Nicole Santos is currently located at home and no one is currently with her during the visit. The provider, Gwenlyn Perking, FNP is located in their office at time of visit.  I discussed the limitations, risks, security and privacy concerns of performing an evaluation and management service by phone and the availability of in person appointments. I also discussed with the patient that there may be a patient responsible charge related to this service. The patient expressed understanding and agreed to proceed.  CC: sinus problem  History and Present Illness:  HPI  Nicole Santos repots headache, head congestion, ear pain, postnasal drip, and sore throat x4 days. She had a fever 2 nights ago but not since. She also reports a dry cough this morning. She has been taking tylenol and advil for her symptoms. She has had 2 negative home Covid tests. She denies shortness of breath or chest pain.     ROS As per HPI.  Observations/Objective: Alert and oriented x 3. Able to speak in full sentences without difficulty.    Assessment and Plan: Nicole Santos was seen today for sinus problem.  Diagnoses and all orders for this visit:  Viral URI with cough Negative Covid test x2 day. Discussed that antibiotics are not warranted at this point as symptoms have only been present for 4 days. Expect viral symptoms to last 7-10 days. Discussed tylenol and  advil for pain, mucinex for congestion, nasal saline for congestion, flonase for postnasal drip. Rest, stay well hydrated. Return to office for new or worsening symptoms, or if symptoms persist.    Follow Up Instructions: As needed.     I discussed the assessment and treatment plan with the patient. The patient was provided an opportunity to ask questions and all were answered. The patient agreed with the plan and demonstrated an understanding of the instructions.   The patient was advised to call back or seek an in-person evaluation if the symptoms worsen or if the condition fails to improve as anticipated.  The above assessment and management plan was discussed with the patient. The patient verbalized understanding of and has agreed to the management plan. Patient is aware to call the clinic if symptoms persist or worsen. Patient is aware when to return to the clinic for a follow-up visit. Patient educated on when it is appropriate to go to the emergency department.   Time call ended:  1249  I provided 10 minutes of non face-to-face time during this encounter.   Gwenlyn Perking, FNP

## 2020-09-08 ENCOUNTER — Telehealth: Payer: Self-pay

## 2020-09-08 ENCOUNTER — Other Ambulatory Visit: Payer: Self-pay | Admitting: Family Medicine

## 2020-09-08 DIAGNOSIS — J019 Acute sinusitis, unspecified: Secondary | ICD-10-CM

## 2020-09-08 MED ORDER — CEFDINIR 300 MG PO CAPS
300.0000 mg | ORAL_CAPSULE | Freq: Two times a day (BID) | ORAL | 0 refills | Status: AC
Start: 2020-09-08 — End: 2020-09-15

## 2020-09-08 NOTE — Telephone Encounter (Signed)
Patient aware and verbalized understanding. °

## 2020-09-08 NOTE — Telephone Encounter (Signed)
Omnicef sent in to [facility] in [place]

## 2020-09-08 NOTE — Telephone Encounter (Signed)
  Incoming Patient Call  09/08/2020  What symptoms do you have? Headache, sore throat and ears are sore.Wants a ABX called in because she is not getting any better.  How long have you been sick? Since last Friday  Have you been seen for this problem? Had televisit 4-4 with Tiffany  If your provider decides to give you a prescription, which pharmacy would you like for it to be sent to? [facility]   Patient informed that this information will be sent to the clinical staff for review and that they should receive a follow up call.

## 2020-10-04 ENCOUNTER — Other Ambulatory Visit: Payer: Self-pay | Admitting: Family Medicine

## 2020-10-04 DIAGNOSIS — Z1231 Encounter for screening mammogram for malignant neoplasm of breast: Secondary | ICD-10-CM

## 2020-10-20 ENCOUNTER — Other Ambulatory Visit: Payer: Self-pay | Admitting: Family Medicine

## 2020-10-20 DIAGNOSIS — E119 Type 2 diabetes mellitus without complications: Secondary | ICD-10-CM

## 2021-03-01 ENCOUNTER — Other Ambulatory Visit: Payer: Self-pay

## 2021-03-01 ENCOUNTER — Encounter: Payer: Commercial Managed Care - PPO | Admitting: Family Medicine

## 2021-03-01 ENCOUNTER — Ambulatory Visit
Admission: RE | Admit: 2021-03-01 | Discharge: 2021-03-01 | Disposition: A | Discharge status: Home / Self Care | Payer: Commercial Managed Care - PPO | Source: Ambulatory Visit | Attending: Family Medicine | Admitting: Family Medicine

## 2021-03-01 DIAGNOSIS — Z1231 Encounter for screening mammogram for malignant neoplasm of breast: Secondary | ICD-10-CM

## 2021-03-28 ENCOUNTER — Ambulatory Visit (INDEPENDENT_AMBULATORY_CARE_PROVIDER_SITE_OTHER): Payer: Commercial Managed Care - PPO | Admitting: Family Medicine

## 2021-03-28 ENCOUNTER — Other Ambulatory Visit: Payer: Self-pay

## 2021-03-28 ENCOUNTER — Encounter: Payer: Self-pay | Admitting: Family Medicine

## 2021-03-28 VITALS — BP 138/85 | HR 78 | Temp 97.2°F | Resp 20 | Ht 62.0 in | Wt 273.0 lb

## 2021-03-28 DIAGNOSIS — E1165 Type 2 diabetes mellitus with hyperglycemia: Secondary | ICD-10-CM

## 2021-03-28 DIAGNOSIS — Z0001 Encounter for general adult medical examination with abnormal findings: Secondary | ICD-10-CM

## 2021-03-28 DIAGNOSIS — E785 Hyperlipidemia, unspecified: Secondary | ICD-10-CM

## 2021-03-28 DIAGNOSIS — I10 Essential (primary) hypertension: Secondary | ICD-10-CM | POA: Diagnosis not present

## 2021-03-28 DIAGNOSIS — E1169 Type 2 diabetes mellitus with other specified complication: Secondary | ICD-10-CM

## 2021-03-28 DIAGNOSIS — Z6841 Body Mass Index (BMI) 40.0 and over, adult: Secondary | ICD-10-CM

## 2021-03-28 DIAGNOSIS — Z1211 Encounter for screening for malignant neoplasm of colon: Secondary | ICD-10-CM

## 2021-03-28 DIAGNOSIS — Z0000 Encounter for general adult medical examination without abnormal findings: Secondary | ICD-10-CM

## 2021-03-28 DIAGNOSIS — E119 Type 2 diabetes mellitus without complications: Secondary | ICD-10-CM

## 2021-03-28 LAB — BAYER DCA HB A1C WAIVED: HB A1C (BAYER DCA - WAIVED): 7.1 % — ABNORMAL HIGH (ref 4.8–5.6)

## 2021-03-28 MED ORDER — LISINOPRIL 2.5 MG PO TABS: 2.5000 mg | ORAL_TABLET | Freq: Every day | ORAL | 1 refills | Status: DC

## 2021-03-28 MED ORDER — OZEMPIC (0.25 OR 0.5 MG/DOSE) 2 MG/1.5ML ~~LOC~~ SOPN: 0.2500 mg | PEN_INJECTOR | SUBCUTANEOUS | 1 refills | Status: DC

## 2021-03-28 MED ORDER — ATORVASTATIN CALCIUM 20 MG PO TABS: 20.0000 mg | ORAL_TABLET | Freq: Every day | ORAL | 1 refills | Status: DC

## 2021-03-28 MED ORDER — METFORMIN HCL 500 MG PO TABS: 1000.0000 mg | ORAL_TABLET | Freq: Every day | ORAL | 1 refills | Status: DC

## 2021-03-28 NOTE — Progress Notes (Signed)
Assessment & Plan:  1. Well adult exam Preventive health education provided. - CBC with Differential/Platelet - CMP14+EGFR - Lipid panel  2. Type 2 diabetes mellitus with hyperglycemia, without long-term current use of insulin ([facility]) Lab Results  Component Value Date   HGBA1C 7.1 (H) 03/28/2021   HGBA1C 6.7 08/30/2020   HGBA1C 7.2 (H) 05/31/2020    - Diabetes is not at goal of A1c < 7. - Medications: continue current medications, start Ozempic 0.25 mg once weekly. - Home glucose monitoring: continue monitoring - Patient is currently taking a statin. Patient is taking an ACE-inhibitor/ARB.  - Instruction/counseling given: discussed the need for weight loss and discussed diet  Diabetes Health Maintenance Due  Topic Date Due   OPHTHALMOLOGY EXAM  Never done   FOOT EXAM  05/31/2021   HEMOGLOBIN A1C  09/26/2021    Lab Results  Component Value Date   LABMICR <3.0 03/28/2021   LABMICR 3.6 02/19/2020   - Microalbumin / creatinine urine ratio - Bayer DCA Hb A1c Waived - CBC with Differential/Platelet - CMP14+EGFR - Lipid panel - metFORMIN (GLUCOPHAGE) 500 MG tablet; Take 2 tablets (1,000 mg total) by mouth daily.  Dispense: 180 tablet; Refill: 1 - lisinopril (ZESTRIL) 2.5 MG tablet; Take 1 tablet (2.5 mg total) by mouth daily.  Dispense: 90 tablet; Refill: 1 - atorvastatin (LIPITOR) 20 MG tablet; Take 1 tablet (20 mg total) by mouth daily.  Dispense: 90 tablet; Refill: 1 - Semaglutide,0.25 or 0.5MG/DOS, (OZEMPIC, 0.25 OR 0.5 MG/DOSE,) 2 MG/1.5ML SOPN; Inject 0.25 mg into the skin once a week.  Dispense: 1.5 mL; Refill: 1  3. Hyperlipidemia associated with type 2 diabetes mellitus ([place]) Labs to assess. - CBC with Differential/Platelet - CMP14+EGFR - Lipid panel - atorvastatin (LIPITOR) 20 MG tablet; Take 1 tablet (20 mg total) by mouth daily.  Dispense: 90 tablet; Refill: 1  4. Essential hypertension Well controlled on current regimen.  - CBC with Differential/Platelet -  CMP14+EGFR - Lipid panel - lisinopril (ZESTRIL) 2.5 MG tablet; Take 1 tablet (2.5 mg total) by mouth daily.  Dispense: 90 tablet; Refill: 1  5. Morbid obesity with BMI of 45.0-49.9, adult ([place]) Healthy eating and exercise encouraged. Started Ozempic 0.25 mg once weekly.  - Semaglutide,0.25 or 0.5MG/DOS, (OZEMPIC, 0.25 OR 0.5 MG/DOSE,) 2 MG/1.5ML SOPN; Inject 0.25 mg into the skin once a week.  Dispense: 1.5 mL; Refill: 1  6. Colon cancer screening - Cologuard   Follow-up: Return in about 6 weeks (around 05/09/2021) for weight.   Hendricks Limes, MSN, APRN, FNP-C [facility]  Subjective:  Patient ID: Nicole Santos, female    DOB: 1971/01/26  Age: 50 y.o. MRN: [id number]  Patient Care Team: Loman Brooklyn, FNP as PCP - General (Family Medicine)   CC:  Chief Complaint  Patient presents with   Annual Exam    HPI Nicole Santos presents for her annual physical.   Occupation: Unemployed, Marital status: Married, Substance use: None Diet: Diabetic, Exercise: No structured exercise, but stays active chasing around after her grandsons Last eye exam: February 2022 Last dental exam: Dr. Abigail Miyamoto a few months ago during the summer Last colonoscopy: never; agreeable to Cologuard Last mammogram: 03/01/2021 Last pap smear: a long time ago; declined today Hepatitis C Screening: WNL on 02/19/2020 Immunizations: Flu Vaccine: declined Tdap Vaccine: declined  Shingrix Vaccine: declined  COVID-19 Vaccine: declined Pneumonia Vaccine: declined  DEPRESSION SCREENING PHQ 2/9 Scores 03/28/2021 08/30/2020 05/31/2020 02/19/2020 08/19/2019 07/06/2019 04/03/2019  PHQ - 2 Score 0  0 0 0 0 0 0  PHQ- 9 Score 3 - - - - - -     Diabetes with hypertension and hyperlipidemia: Patient presents for follow up of diabetes. Current symptoms include: hyperglycemia. Known diabetic complications: none. Medication compliance: yes. Current diet: in general, a "healthy" diet  . Current exercise: none. Home  blood sugar records:  does not consistently check. States she checked one day after a birthday party when she cheated and her sugar was 216 . Is she  on ACE inhibitor or angiotensin II receptor blocker? Yes (Lisinopril). Is she on a statin? Yes (Atorvastatin).    Review of Systems  Constitutional:  Negative for chills, fever, malaise/fatigue and weight loss.  HENT:  Negative for congestion, ear discharge, ear pain, nosebleeds, sinus pain, sore throat and tinnitus.   Eyes:  Negative for blurred vision, double vision, pain, discharge and redness.  Respiratory:  Negative for cough and wheezing.   Cardiovascular:  Negative for chest pain, palpitations and leg swelling.  Gastrointestinal:  Negative for abdominal pain, constipation, diarrhea, heartburn, nausea and vomiting.  Genitourinary:  Negative for dysuria, frequency and urgency.  Musculoskeletal:  Negative for myalgias.  Skin:  Negative for rash.  Neurological:  Negative for dizziness, seizures, weakness and headaches.  Psychiatric/Behavioral:  Negative for depression, substance abuse and suicidal ideas. The patient is not nervous/anxious.     Current Outpatient Medications:    aspirin EC 81 MG tablet, Take 81 mg by mouth daily., Disp: , Rfl:    atorvastatin (LIPITOR) 20 MG tablet, Take 1 tablet (20 mg total) by mouth daily., Disp: 90 tablet, Rfl: 1   cimetidine (TAGAMET) 200 MG tablet, Take 200 mg by mouth as needed. , Disp: , Rfl:    ibuprofen (ADVIL) 200 MG tablet, Take 200 mg by mouth every 6 (six) hours as needed for mild pain or moderate pain., Disp: , Rfl:    lisinopril (ZESTRIL) 2.5 MG tablet, Take 1 tablet (2.5 mg total) by mouth daily., Disp: 90 tablet, Rfl: 1   metFORMIN (GLUCOPHAGE) 500 MG tablet, Take 2 tablets (1,000 mg total) by mouth daily., Disp: 180 tablet, Rfl: 1  Allergies  Allergen Reactions   Augmentin [Amoxicillin-Pot Clavulanate] Diarrhea and Nausea And Vomiting   Doxycycline     Abdominal cramps.   Topamax  [Topiramate] Nausea And Vomiting    Past Medical History:  Diagnosis Date   Acute respiratory disease due to COVID-19 virus 02/02/2019   Cancer ([place])    Diabetes mellitus without complication ([place])    Hypertension     Past Surgical History:  Procedure Laterality Date   KNEE SURGERY Right    Arthroscopic   TONSILLECTOMY AND ADENOIDECTOMY      Family History  Problem Relation Age of Onset   Hypertension Mother    Breast cancer Maternal Great-grandmother     Social History   Socioeconomic History   Marital status: Single    Spouse name: Not on file   Number of children: Not on file   Years of education: Not on file   Highest education level: Not on file  Occupational History   Not on file  Tobacco Use   Smoking status: Never   Smokeless tobacco: Never  Vaping Use   Vaping Use: Never used  Substance and Sexual Activity   Alcohol use: No   Drug use: No   Sexual activity: Not on file  Other Topics Concern   Not on file  Social History Narrative   Not on file  Social Determinants of Health   Financial Resource Strain: Not on file  Food Insecurity: Not on file  Transportation Needs: Not on file  Physical Activity: Not on file  Stress: Not on file  Social Connections: Not on file  Intimate Partner Violence: Not on file      Objective:    BP 138/85   Pulse 78   Temp (!) 97.2 F (36.2 C) (Temporal)   Resp 20   Ht 5' 2"  (1.575 m)   Wt 273 lb (123.8 kg)   SpO2 98%   BMI 49.93 kg/m   Wt Readings from Last 3 Encounters:  03/28/21 273 lb (123.8 kg)  08/30/20 272 lb 3.2 oz (123.5 kg)  05/31/20 277 lb 9.6 oz (125.9 kg)    Physical Exam Vitals reviewed.  Constitutional:      General: She is not in acute distress.    Appearance: Normal appearance. She is morbidly obese. She is not ill-appearing, toxic-appearing or diaphoretic.  HENT:     Head: Normocephalic and atraumatic.     Right Ear: Tympanic membrane, ear canal and external ear normal. There is  no impacted cerumen.     Left Ear: Tympanic membrane, ear canal and external ear normal. There is no impacted cerumen.     Nose: Nose normal. No congestion or rhinorrhea.     Mouth/Throat:     Mouth: Mucous membranes are moist.     Pharynx: Oropharynx is clear. No oropharyngeal exudate or posterior oropharyngeal erythema.  Eyes:     General: No scleral icterus.       Right eye: No discharge.        Left eye: No discharge.     Conjunctiva/sclera: Conjunctivae normal.     Pupils: Pupils are equal, round, and reactive to light.  Cardiovascular:     Rate and Rhythm: Normal rate and regular rhythm.     Heart sounds: Normal heart sounds. No murmur heard.   No friction rub. No gallop.  Pulmonary:     Effort: Pulmonary effort is normal. No respiratory distress.     Breath sounds: Normal breath sounds. No stridor. No wheezing, rhonchi or rales.  Abdominal:     General: Abdomen is flat. Bowel sounds are normal. There is no distension.     Palpations: Abdomen is soft. There is no hepatomegaly, splenomegaly or mass.     Tenderness: There is no abdominal tenderness. There is no guarding or rebound.     Hernia: No hernia is present.  Musculoskeletal:        General: Normal range of motion.     Cervical back: Normal range of motion and neck supple. No rigidity. No muscular tenderness.  Lymphadenopathy:     Cervical: No cervical adenopathy.  Skin:    General: Skin is warm and dry.     Capillary Refill: Capillary refill takes less than 2 seconds.  Neurological:     General: No focal deficit present.     Mental Status: She is alert and oriented to person, place, and time. Mental status is at baseline.  Psychiatric:        Mood and Affect: Mood normal.        Behavior: Behavior normal.        Thought Content: Thought content normal.        Judgment: Judgment normal.    Lab Results  Component Value Date   TSH 0.949 02/19/2020   Lab Results  Component Value Date   WBC 5.8 08/30/2020  HGB  14.4 08/30/2020   HCT 43.3 08/30/2020   MCV 89 08/30/2020   PLT 313 08/30/2020   Lab Results  Component Value Date   NA 140 08/30/2020   K 5.0 08/30/2020   CO2 21 08/30/2020   GLUCOSE 148 (H) 08/30/2020   BUN 14 08/30/2020   CREATININE 0.81 08/30/2020   BILITOT 0.6 08/30/2020   ALKPHOS 65 08/30/2020   AST 17 08/30/2020   ALT 21 08/30/2020   PROT 6.5 08/30/2020   ALBUMIN 4.2 08/30/2020   CALCIUM 9.5 08/30/2020   ANIONGAP 12 02/08/2019   EGFR 88 08/30/2020   Lab Results  Component Value Date   CHOL 144 08/30/2020   Lab Results  Component Value Date   HDL 53 08/30/2020   Lab Results  Component Value Date   LDLCALC 75 08/30/2020   Lab Results  Component Value Date   TRIG 83 08/30/2020   Lab Results  Component Value Date   CHOLHDL 2.7 08/30/2020   Lab Results  Component Value Date   HGBA1C 6.7 08/30/2020

## 2021-03-29 LAB — CMP14+EGFR
ALT: 27 IU/L (ref 0–32)
AST: 19 IU/L (ref 0–40)
Albumin/Globulin Ratio: 1.6 (ref 1.2–2.2)
Albumin: 4.6 g/dL (ref 3.8–4.9)
Alkaline Phosphatase: 75 IU/L (ref 44–121)
BUN/Creatinine Ratio: 17 (ref 9–23)
BUN: 13 mg/dL (ref 6–24)
Bilirubin Total: 0.9 mg/dL (ref 0.0–1.2)
CO2: 21 mmol/L (ref 20–29)
Calcium: 9.5 mg/dL (ref 8.7–10.2)
Chloride: 101 mmol/L (ref 96–106)
Creatinine, Ser: 0.75 mg/dL (ref 0.57–1.00)
Globulin, Total: 2.8 g/dL (ref 1.5–4.5)
Glucose: 132 mg/dL — ABNORMAL HIGH (ref 70–99)
Potassium: 4 mmol/L (ref 3.5–5.2)
Sodium: 141 mmol/L (ref 134–144)
Total Protein: 7.4 g/dL (ref 6.0–8.5)
eGFR: 96 mL/min/{1.73_m2} (ref >59)

## 2021-03-29 LAB — CBC WITH DIFFERENTIAL/PLATELET
Basophils Absolute: 0 10*3/uL (ref 0.0–0.2)
Basos: 1 %
EOS (ABSOLUTE): 0.2 10*3/uL (ref 0.0–0.4)
Eos: 3 %
Hematocrit: 44.3 % (ref 34.0–46.6)
Hemoglobin: 14.9 g/dL (ref 11.1–15.9)
Immature Grans (Abs): 0 10*3/uL (ref 0.0–0.1)
Immature Granulocytes: 0 %
Lymphocytes Absolute: 2.3 10*3/uL (ref 0.7–3.1)
Lymphs: 33 %
MCH: 30.3 pg (ref 26.6–33.0)
MCHC: 33.6 g/dL (ref 31.5–35.7)
MCV: 90 fL (ref 79–97)
Monocytes Absolute: 0.5 10*3/uL (ref 0.1–0.9)
Monocytes: 8 %
Neutrophils Absolute: 4 10*3/uL (ref 1.4–7.0)
Neutrophils: 55 %
Platelets: 369 10*3/uL (ref 150–450)
RBC: 4.91 x10E6/uL (ref 3.77–5.28)
RDW: 12.4 % (ref 11.7–15.4)
WBC: 7.1 10*3/uL (ref 3.4–10.8)

## 2021-03-29 LAB — MICROALBUMIN / CREATININE URINE RATIO
Creatinine, Urine: 130.8 mg/dL
Microalb/Creat Ratio: <2 mg/g creat (ref 0–29)
Microalbumin, Urine: <3 ug/mL

## 2021-03-29 LAB — LIPID PANEL
Chol/HDL Ratio: 3 ratio (ref 0.0–4.4)
Cholesterol, Total: 159 mg/dL (ref 100–199)
HDL: 53 mg/dL (ref >39)
LDL Chol Calc (NIH): 84 mg/dL (ref 0–99)
Triglycerides: 122 mg/dL (ref 0–149)
VLDL Cholesterol Cal: 22 mg/dL (ref 5–40)

## 2021-04-02 ENCOUNTER — Encounter: Payer: Self-pay | Admitting: Family Medicine

## 2021-04-02 DIAGNOSIS — E669 Obesity, unspecified: Secondary | ICD-10-CM | POA: Insufficient documentation

## 2021-04-02 DIAGNOSIS — Z6841 Body Mass Index (BMI) 40.0 and over, adult: Secondary | ICD-10-CM | POA: Insufficient documentation

## 2021-04-15 ENCOUNTER — Other Ambulatory Visit: Payer: Self-pay | Admitting: Family Medicine

## 2021-04-15 DIAGNOSIS — E1165 Type 2 diabetes mellitus with hyperglycemia: Secondary | ICD-10-CM

## 2021-05-10 ENCOUNTER — Ambulatory Visit: Payer: Commercial Managed Care - PPO | Admitting: Family Medicine

## 2021-05-10 ENCOUNTER — Encounter: Payer: Self-pay | Admitting: Family Medicine

## 2021-05-10 VITALS — BP 130/71 | HR 81 | Temp 97.2°F | Ht 62.0 in | Wt 270.4 lb

## 2021-05-10 DIAGNOSIS — Z6841 Body Mass Index (BMI) 40.0 and over, adult: Secondary | ICD-10-CM

## 2021-05-10 DIAGNOSIS — E1165 Type 2 diabetes mellitus with hyperglycemia: Secondary | ICD-10-CM

## 2021-05-10 MED ORDER — OZEMPIC (0.25 OR 0.5 MG/DOSE) 2 MG/1.5ML ~~LOC~~ SOPN: 0.5000 mg | PEN_INJECTOR | SUBCUTANEOUS | 2 refills | Status: DC

## 2021-05-10 NOTE — Progress Notes (Signed)
Assessment & Plan:  1-2. Morbid obesity with BMI of 45.0-49.9, adult (HCC)/Type 2 diabetes mellitus with hyperglycemia, without long-term current use of insulin ([name]) - tolerating Ozempic well, increase from 0.25 mg to 0.5 mg  - Semaglutide,0.25 or 0.5MG/DOS, (OZEMPIC, 0.25 OR 0.5 MG/DOSE,) 2 MG/1.5ML SOPN; Inject 0.5 mg into the skin once a week.  Dispense: 1.5 mL; Refill: 2   Return in about 6 weeks (around 06/21/2021) for DM.  [name], NP Student  I personally was present during the history, physical exam, and medical decision-making activities of this service and have verified that the service and findings are accurately documented in the nurse practitioner student's note.  Hendricks Limes, MSN, APRN, FNP-C [facility]   Subjective:    Patient ID: Nicole Santos, female    DOB: 1971/02/20, 50 y.o.   MRN: [id number]  Patient Care Team: Loman Brooklyn, FNP as PCP - General (Family Medicine) Harlen Labs, MD as Referring Physician (Optometry)   Chief Complaint:  Chief Complaint  Patient presents with   wt check    6 week follow up     HPI: Nicole Santos is a 50 y.o. female presenting on 05/10/2021 for wt check (6 week follow up )  Obesity: She was started on Ozempic 0.25 mg at her last visit to improve glycemic control and weight loss. She states she is tolerating it well. She has lost 3 lbs since her last visit.   New complaints: None  Social history:  Relevant past medical, surgical, family and social history reviewed and updated as indicated. Interim medical history since our last visit reviewed.  Allergies and medications reviewed and updated.  DATA REVIEWED: CHART IN EPIC  ROS: Negative unless specifically indicated above in HPI.    Current Outpatient Medications:    aspirin EC 81 MG tablet, Take 81 mg by mouth daily., Disp: , Rfl:    atorvastatin (LIPITOR) 20 MG tablet, Take 1 tablet (20 mg total) by mouth daily., Disp: 90 tablet,  Rfl: 1   cimetidine (TAGAMET) 200 MG tablet, Take 200 mg by mouth as needed. , Disp: , Rfl:    ibuprofen (ADVIL) 200 MG tablet, Take 200 mg by mouth every 6 (six) hours as needed for mild pain or moderate pain., Disp: , Rfl:    lisinopril (ZESTRIL) 2.5 MG tablet, Take 1 tablet (2.5 mg total) by mouth daily., Disp: 90 tablet, Rfl: 1   metFORMIN (GLUCOPHAGE) 500 MG tablet, TAKE 2 TABLETS BY MOUTH EVERY DAY, Disp: 180 tablet, Rfl: 0   Semaglutide,0.25 or 0.5MG/DOS, (OZEMPIC, 0.25 OR 0.5 MG/DOSE,) 2 MG/1.5ML SOPN, Inject 0.5 mg into the skin once a week., Disp: 1.5 mL, Rfl: 2   Allergies  Allergen Reactions   Augmentin [Amoxicillin-Pot Clavulanate] Diarrhea and Nausea And Vomiting   Doxycycline     Abdominal cramps.   Topamax [Topiramate] Nausea And Vomiting   Past Medical History:  Diagnosis Date   Acute respiratory disease due to COVID-19 virus 02/02/2019   Cancer ([place])    Diabetes mellitus without complication ([place])    Hypertension     Past Surgical History:  Procedure Laterality Date   KNEE SURGERY Right    Arthroscopic   TONSILLECTOMY AND ADENOIDECTOMY      Social History   Socioeconomic History   Marital status: Married    Spouse name: Not on file   Number of children: Not on file   Years of education: Not on file   Highest education  level: Not on file  Occupational History   Occupation: Unemployed  Tobacco Use   Smoking status: Never   Smokeless tobacco: Never  Vaping Use   Vaping Use: Never used  Substance and Sexual Activity   Alcohol use: No   Drug use: No   Sexual activity: Not on file  Other Topics Concern   Not on file  Social History Narrative   Not on file   Social Determinants of Health   Financial Resource Strain: Not on file  Food Insecurity: Not on file  Transportation Needs: Not on file  Physical Activity: Not on file  Stress: Not on file  Social Connections: Not on file  Intimate Partner Violence: Not on file        Objective:    BP  130/71   Pulse 81   Temp (!) 97.2 F (36.2 C) (Temporal)   Ht 5' 2"  (1.575 m)   Wt 122.7 kg   BMI 49.46 kg/m   Wt Readings from Last 3 Encounters:  05/10/21 270 lb 6.4 oz (122.7 kg)  03/28/21 273 lb (123.8 kg)  08/30/20 272 lb 3.2 oz (123.5 kg)    Physical Exam Vitals reviewed.  Constitutional:      General: She is not in acute distress.    Appearance: Normal appearance. She is morbidly obese. She is not ill-appearing, toxic-appearing or diaphoretic.  HENT:     Head: Normocephalic and atraumatic.  Eyes:     General: No scleral icterus.       Right eye: No discharge.        Left eye: No discharge.     Conjunctiva/sclera: Conjunctivae normal.  Cardiovascular:     Rate and Rhythm: Normal rate and regular rhythm.     Heart sounds: Normal heart sounds. No murmur heard.   No friction rub. No gallop.  Pulmonary:     Effort: Pulmonary effort is normal. No respiratory distress.     Breath sounds: Normal breath sounds. No stridor. No wheezing, rhonchi or rales.  Musculoskeletal:        General: Normal range of motion.     Cervical back: Normal range of motion.  Skin:    General: Skin is warm and dry.     Capillary Refill: Capillary refill takes less than 2 seconds.  Neurological:     General: No focal deficit present.     Mental Status: She is alert and oriented to person, place, and time. Mental status is at baseline.  Psychiatric:        Mood and Affect: Mood normal.        Behavior: Behavior normal.        Thought Content: Thought content normal.        Judgment: Judgment normal.    Lab Results  Component Value Date   TSH 0.949 02/19/2020   Lab Results  Component Value Date   WBC 7.1 03/28/2021   HGB 14.9 03/28/2021   HCT 44.3 03/28/2021   MCV 90 03/28/2021   PLT 369 03/28/2021   Lab Results  Component Value Date   NA 141 03/28/2021   K 4.0 03/28/2021   CO2 21 03/28/2021   GLUCOSE 132 (H) 03/28/2021   BUN 13 03/28/2021   CREATININE 0.75 03/28/2021    BILITOT 0.9 03/28/2021   ALKPHOS 75 03/28/2021   AST 19 03/28/2021   ALT 27 03/28/2021   PROT 7.4 03/28/2021   ALBUMIN 4.6 03/28/2021   CALCIUM 9.5 03/28/2021   ANIONGAP 12 02/08/2019  EGFR 96 03/28/2021   Lab Results  Component Value Date   CHOL 159 03/28/2021   Lab Results  Component Value Date   HDL 53 03/28/2021   Lab Results  Component Value Date   LDLCALC 84 03/28/2021   Lab Results  Component Value Date   TRIG 122 03/28/2021   Lab Results  Component Value Date   CHOLHDL 3.0 03/28/2021   Lab Results  Component Value Date   HGBA1C 7.1 (H) 03/28/2021

## 2021-06-21 ENCOUNTER — Encounter: Payer: Self-pay | Admitting: Family Medicine

## 2021-06-21 ENCOUNTER — Ambulatory Visit: Payer: Commercial Managed Care - PPO | Admitting: Family Medicine

## 2021-06-21 VITALS — BP 134/74 | HR 106 | Temp 97.3°F | Ht 62.0 in | Wt 263.6 lb

## 2021-06-21 DIAGNOSIS — F419 Anxiety disorder, unspecified: Secondary | ICD-10-CM

## 2021-06-21 DIAGNOSIS — I10 Essential (primary) hypertension: Secondary | ICD-10-CM

## 2021-06-21 DIAGNOSIS — E1169 Type 2 diabetes mellitus with other specified complication: Secondary | ICD-10-CM | POA: Diagnosis not present

## 2021-06-21 DIAGNOSIS — E785 Hyperlipidemia, unspecified: Secondary | ICD-10-CM

## 2021-06-21 DIAGNOSIS — Z6841 Body Mass Index (BMI) 40.0 and over, adult: Secondary | ICD-10-CM

## 2021-06-21 DIAGNOSIS — E1165 Type 2 diabetes mellitus with hyperglycemia: Secondary | ICD-10-CM | POA: Diagnosis not present

## 2021-06-21 LAB — BAYER DCA HB A1C WAIVED: HB A1C (BAYER DCA - WAIVED): 6 % — ABNORMAL HIGH (ref 4.8–5.6)

## 2021-06-21 MED ORDER — METFORMIN HCL 500 MG PO TABS: 500.0000 mg | ORAL_TABLET | Freq: Every day | ORAL | 1 refills | Status: DC

## 2021-06-21 MED ORDER — LISINOPRIL 5 MG PO TABS: 5.0000 mg | ORAL_TABLET | Freq: Every day | ORAL | 2 refills | Status: DC

## 2021-06-21 MED ORDER — SEMAGLUTIDE (1 MG/DOSE) 4 MG/3ML ~~LOC~~ SOPN: 1.0000 mg | PEN_INJECTOR | SUBCUTANEOUS | 2 refills | Status: DC

## 2021-06-21 NOTE — Progress Notes (Signed)
Assessment & Plan:  1. Type 2 diabetes mellitus with hyperglycemia, without long-term current use of insulin (HCC) A1c 6.0 today which is improved from 7.1 three months ago. - Diabetes is at goal of A1c < 7. - Medications:  decrease metformin to 500 mg daily, increase Ozempic to 1 mg weekly - Home glucose monitoring: continue to monitor periodically  - Patient is currently taking a statin. Patient is taking an ACE-inhibitor/ARB.  - Instruction/counseling given: reminded to get eye exam, discussed foot care, discussed the need for weight loss, and discussed diet  Diabetes Health Maintenance Due  Topic Date Due   OPHTHALMOLOGY EXAM  Never done   HEMOGLOBIN A1C  09/26/2021   FOOT EXAM  06/21/2022    Lab Results  Component Value Date   LABMICR <3.0 03/28/2021   LABMICR 3.6 02/19/2020    - Bayer DCA Hb A1c Waived, 6.0 today - Vitamin B12 - Semaglutide, 1 MG/DOSE, 4 MG/3ML SOPN; Inject 1 mg as directed once a week.  Dispense: 3 mL; Refill: 2 - metFORMIN (GLUCOPHAGE) 500 MG tablet; Take 1 tablet (500 mg total) by mouth daily.  Dispense: 90 tablet; Refill: 1  2. Hyperlipidemia associated with type 2 diabetes mellitus ([place]) - continue statin - Lipid panel  3. Essential hypertension - uncontrolled, increase lisinopril from 2.5 mg to 5 mg daily - CBC with Differential/Platelet - CMP14+EGFR  4. Morbid obesity with BMI of 45.0-49.9, adult (HCC) - improving, congratulated on additional 7 lb weight loss  5. Anxiety - printed information about mindfulness provided - patient declined medication to treat   Return in about 3 months (around 09/19/2021) for follow-up of chronic medication conditions.  [name], NP Student  I personally was present during the history, physical exam, and medical decision-making activities of this service and have verified that the service and findings are accurately documented in the nurse practitioner student's note.  Hendricks Limes, MSN, APRN,  FNP-C [facility]  Subjective:    Patient ID: Nicole Santos, female    DOB: 1970/11/30, 51 y.o.   MRN: [id number]  Patient Care Team: Loman Brooklyn, FNP as PCP - General (Family Medicine) Harlen Labs, MD as Referring Physician (Optometry)   Chief Complaint:  Chief Complaint  Patient presents with   Diabetes    6 week follow up     HPI: Nicole Santos is a 50 y.o. female presenting on 06/21/2021 for Diabetes (6 week follow up )  Obesity: She was increased to 0.5 mg Ozempic at her last visit to improve glycemic control and weight loss. She states she is tolerating it well. She has lost 7 lbs since her last visit.   Diabetes: Patient presents for follow up of diabetes. Current symptoms include: hyperglycemia. Known diabetic complications: cardiovascular disease. Medication compliance: Yes, metformin 500 mg BID, Ozempic 0.5 mg weekly. Current diet: in general, a "healthy" diet  . Current exercise: none. Home blood sugar records:  does not consisently check . Is she  on ACE inhibitor or angiotensin II receptor blocker? Yes (Lisinopril). Is she on a statin? Yes (Atorvastatin).   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 She states that she worries about her kids mostly, but that she does not feel out of control. She states she has been on Zoloft in the past and it made her feel completely numb, so she stopped taking it. She states she does not need a medication for this at  this time, but will reach out of it gets unmanageable.   Depression screen [facility] 2/9 06/21/2021 03/28/2021 08/30/2020  Decreased Interest 1 0 0  Down, Depressed, Hopeless 1 0 0  PHQ - 2 Score 2 0 0  Altered sleeping 1 1 -  Tired, decreased energy 1 1 -  Change in appetite 0 1 -  Feeling bad or failure about yourself  1 0 -  Trouble concentrating 0 0 -  Moving slowly or fidgety/restless 0 0 -  Suicidal thoughts 0 0 -  PHQ-9 Score 5 3 -  Difficult doing  work/chores Not difficult at all Not difficult at all -   GAD 7 : Generalized Anxiety Score 06/21/2021 03/28/2021  Nervous, Anxious, on Edge 1 0  Control/stop worrying 3 3  Worry too much - different things 3 3  Trouble relaxing 0 0  Restless 0 0  Easily annoyed or irritable 2 3  Afraid - awful might happen 2 3  Total GAD 7 Score 11 12  Anxiety Difficulty Not difficult at all Not difficult at all    New complaints: None  Social history:  Relevant past medical, surgical, family and social history reviewed and updated as indicated. Interim medical history since our last visit reviewed.  Allergies and medications reviewed and updated.  DATA REVIEWED: CHART IN EPIC  ROS: Negative unless specifically indicated above in HPI.    Current Outpatient Medications:    aspirin EC 81 MG tablet, Take 81 mg by mouth daily., Disp: , Rfl:    atorvastatin (LIPITOR) 20 MG tablet, Take 1 tablet (20 mg total) by mouth daily., Disp: 90 tablet, Rfl: 1   cimetidine (TAGAMET) 200 MG tablet, Take 200 mg by mouth as needed. , Disp: , Rfl:    ibuprofen (ADVIL) 200 MG tablet, Take 200 mg by mouth every 6 (six) hours as needed for mild pain or moderate pain., Disp: , Rfl:    Semaglutide, 1 MG/DOSE, 4 MG/3ML SOPN, Inject 1 mg as directed once a week., Disp: 3 mL, Rfl: 2   lisinopril (ZESTRIL) 5 MG tablet, Take 1 tablet (5 mg total) by mouth daily., Disp: 30 tablet, Rfl: 2   metFORMIN (GLUCOPHAGE) 500 MG tablet, Take 1 tablet (500 mg total) by mouth daily., Disp: 90 tablet, Rfl: 1   Allergies  Allergen Reactions   Augmentin [Amoxicillin-Pot Clavulanate] Diarrhea and Nausea And Vomiting   Doxycycline     Abdominal cramps.   Topamax [Topiramate] Nausea And Vomiting   Past Medical History:  Diagnosis Date   Acute respiratory disease due to COVID-19 virus 02/02/2019   Cancer ([place])    Diabetes mellitus without complication ([place])    Hypertension     Past Surgical History:  Procedure Laterality Date    KNEE SURGERY Right    Arthroscopic   TONSILLECTOMY AND ADENOIDECTOMY      Social History   Socioeconomic History   Marital status: Married    Spouse name: Not on file   Number of children: Not on file   Years of education: Not on file   Highest education level: Not on file  Occupational History   Occupation: Unemployed  Tobacco Use   Smoking status: Never   Smokeless tobacco: Never  Vaping Use   Vaping Use: Never used  Substance and Sexual Activity   Alcohol use: No   Drug use: No   Sexual activity: Not on file  Other Topics Concern   Not on file  Social History Narrative   Not on  file   Social Determinants of Health   Financial Resource Strain: Not on file  Food Insecurity: Not on file  Transportation Needs: Not on file  Physical Activity: Not on file  Stress: Not on file  Social Connections: Not on file  Intimate Partner Violence: Not on file        Objective:    BP 134/74    Pulse (!) 106    Temp (!) 97.3 F (36.3 C) (Temporal)    Ht 5' 2"  (1.575 m)    Wt 119.6 kg    SpO2 97%    BMI 48.21 kg/m   Wt Readings from Last 3 Encounters:  06/21/21 263 lb 9.6 oz (119.6 kg)  05/10/21 270 lb 6.4 oz (122.7 kg)  03/28/21 273 lb (123.8 kg)    Physical Exam Vitals reviewed.  Constitutional:      General: She is not in acute distress.    Appearance: Normal appearance. She is morbidly obese. She is not ill-appearing, toxic-appearing or diaphoretic.  HENT:     Head: Normocephalic and atraumatic.  Eyes:     General: No scleral icterus.       Right eye: No discharge.        Left eye: No discharge.     Conjunctiva/sclera: Conjunctivae normal.  Cardiovascular:     Rate and Rhythm: Normal rate and regular rhythm.     Heart sounds: Normal heart sounds. No murmur heard.   No friction rub. No gallop.  Pulmonary:     Effort: Pulmonary effort is normal. No respiratory distress.     Breath sounds: Normal breath sounds. No stridor. No wheezing, rhonchi or rales.   Musculoskeletal:        General: Normal range of motion.     Cervical back: Normal range of motion.  Skin:    General: Skin is warm and dry.     Capillary Refill: Capillary refill takes less than 2 seconds.  Neurological:     General: No focal deficit present.     Mental Status: She is alert and oriented to person, place, and time. Mental status is at baseline.  Psychiatric:        Mood and Affect: Mood normal.        Behavior: Behavior normal.        Thought Content: Thought content normal.        Judgment: Judgment normal.   Diabetic Foot Exam - Simple   Simple Foot Form Diabetic Foot exam was performed with the following findings: Yes 06/21/2021 10:32 AM  Visual Inspection No deformities, no ulcerations, no other skin breakdown bilaterally: Yes Sensation Testing Intact to touch and monofilament testing bilaterally: Yes Pulse Check Posterior Tibialis and Dorsalis pulse intact bilaterally: Yes Comments     Lab Results  Component Value Date   TSH 0.949 02/19/2020   Lab Results  Component Value Date   WBC 7.1 03/28/2021   HGB 14.9 03/28/2021   HCT 44.3 03/28/2021   MCV 90 03/28/2021   PLT 369 03/28/2021   Lab Results  Component Value Date   NA 141 03/28/2021   K 4.0 03/28/2021   CO2 21 03/28/2021   GLUCOSE 132 (H) 03/28/2021   BUN 13 03/28/2021   CREATININE 0.75 03/28/2021   BILITOT 0.9 03/28/2021   ALKPHOS 75 03/28/2021   AST 19 03/28/2021   ALT 27 03/28/2021   PROT 7.4 03/28/2021   ALBUMIN 4.6 03/28/2021   CALCIUM 9.5 03/28/2021   ANIONGAP 12 02/08/2019   EGFR  96 03/28/2021   Lab Results  Component Value Date   CHOL 159 03/28/2021   Lab Results  Component Value Date   HDL 53 03/28/2021   Lab Results  Component Value Date   LDLCALC 84 03/28/2021   Lab Results  Component Value Date   TRIG 122 03/28/2021   Lab Results  Component Value Date   CHOLHDL 3.0 03/28/2021   Lab Results  Component Value Date   HGBA1C 7.1 (H) 03/28/2021

## 2021-06-21 NOTE — Patient Instructions (Addendum)
Please schedule your diabetic eye exam and pap smear.   We are increasing your Ozempic from 0.5 mg to 1 mg weekly and decreasing your Metformin from 1,000 (2 tablets) to 500 mg (1 tablet) once daily.

## 2021-06-22 LAB — CBC WITH DIFFERENTIAL/PLATELET
Basophils Absolute: 0.1 10*3/uL (ref 0.0–0.2)
Basos: 1 %
EOS (ABSOLUTE): 0.2 10*3/uL (ref 0.0–0.4)
Eos: 4 %
Hematocrit: 42.5 % (ref 34.0–46.6)
Hemoglobin: 14.4 g/dL (ref 11.1–15.9)
Immature Grans (Abs): 0 10*3/uL (ref 0.0–0.1)
Immature Granulocytes: 0 %
Lymphocytes Absolute: 1.7 10*3/uL (ref 0.7–3.1)
Lymphs: 31 %
MCH: 29.7 pg (ref 26.6–33.0)
MCHC: 33.9 g/dL (ref 31.5–35.7)
MCV: 88 fL (ref 79–97)
Monocytes Absolute: 0.4 10*3/uL (ref 0.1–0.9)
Monocytes: 7 %
Neutrophils Absolute: 3.1 10*3/uL (ref 1.4–7.0)
Neutrophils: 57 %
Platelets: 352 10*3/uL (ref 150–450)
RBC: 4.85 x10E6/uL (ref 3.77–5.28)
RDW: 11.8 % (ref 11.7–15.4)
WBC: 5.4 10*3/uL (ref 3.4–10.8)

## 2021-06-22 LAB — LIPID PANEL
Chol/HDL Ratio: 2.7 ratio (ref 0.0–4.4)
Cholesterol, Total: 126 mg/dL (ref 100–199)
HDL: 46 mg/dL (ref >39)
LDL Chol Calc (NIH): 63 mg/dL (ref 0–99)
Triglycerides: 91 mg/dL (ref 0–149)
VLDL Cholesterol Cal: 17 mg/dL (ref 5–40)

## 2021-06-22 LAB — CMP14+EGFR
ALT: 36 IU/L — ABNORMAL HIGH (ref 0–32)
AST: 25 IU/L (ref 0–40)
Albumin/Globulin Ratio: 2 (ref 1.2–2.2)
Albumin: 4.4 g/dL (ref 3.8–4.9)
Alkaline Phosphatase: 63 IU/L (ref 44–121)
BUN/Creatinine Ratio: 11 (ref 9–23)
BUN: 10 mg/dL (ref 6–24)
Bilirubin Total: 0.9 mg/dL (ref 0.0–1.2)
CO2: 21 mmol/L (ref 20–29)
Calcium: 9.7 mg/dL (ref 8.7–10.2)
Chloride: 106 mmol/L (ref 96–106)
Creatinine, Ser: 0.91 mg/dL (ref 0.57–1.00)
Globulin, Total: 2.2 g/dL (ref 1.5–4.5)
Glucose: 118 mg/dL — ABNORMAL HIGH (ref 70–99)
Potassium: 5.2 mmol/L (ref 3.5–5.2)
Sodium: 144 mmol/L (ref 134–144)
Total Protein: 6.6 g/dL (ref 6.0–8.5)
eGFR: 76 mL/min/{1.73_m2} (ref >59)

## 2021-06-22 LAB — VITAMIN B12: Vitamin B-12: 241 pg/mL (ref 232–1245)

## 2021-07-13 ENCOUNTER — Encounter: Payer: Self-pay | Admitting: Family Medicine

## 2021-07-13 ENCOUNTER — Ambulatory Visit: Payer: Commercial Managed Care - PPO | Admitting: Family Medicine

## 2021-07-13 VITALS — BP 135/73 | HR 103 | Temp 97.0°F | Ht 62.0 in | Wt 258.8 lb

## 2021-07-13 DIAGNOSIS — J988 Other specified respiratory disorders: Secondary | ICD-10-CM

## 2021-07-13 DIAGNOSIS — H1031 Unspecified acute conjunctivitis, right eye: Secondary | ICD-10-CM

## 2021-07-13 DIAGNOSIS — B9689 Other specified bacterial agents as the cause of diseases classified elsewhere: Secondary | ICD-10-CM

## 2021-07-13 MED ORDER — POLYMYXIN B-TRIMETHOPRIM 10000-0.1 UNIT/ML-% OP SOLN: 1.0000 [drp] | Freq: Four times a day (QID) | OPHTHALMIC | 0 refills | Status: AC

## 2021-07-13 MED ORDER — AZITHROMYCIN 250 MG PO TABS: ORAL_TABLET | ORAL | 0 refills | Status: DC

## 2021-07-13 NOTE — Progress Notes (Signed)
Assessment & Plan:  1. Bacterial respiratory infection Education provided on upper respiratory infections.  Discussed symptom management. - azithromycin (ZITHROMAX Z-PAK) 250 MG tablet; Take 2 tablets (500 mg) PO today, then 1 tablet (250 mg) PO daily x4 days.  Dispense: 6 tablet; Refill: 0  2. Acute bacterial conjunctivitis of right eye Education provided on bacterial conjunctivitis. - trimethoprim-polymyxin b (POLYTRIM) ophthalmic solution; Place 1 drop into the right eye every 6 (six) hours for 10 days.  Dispense: 10 mL; Refill: 0   Follow up plan: Return if symptoms worsen or fail to improve.  Hendricks Limes, MSN, APRN, FNP-C [facility]  Subjective:   Patient ID: Nicole Santos, female    DOB: 17-Sep-1970, 51 y.o.   MRN: [id number]  HPI: Nicole Santos is a 51 y.o. female presenting on 07/13/2021 for Facial Swelling (Right eye swelling that has been going on since 11pm last night), Nasal Congestion, Sore Throat, and ear burning (Bilateral x 1 week )  Patient complains of head congestion, headache, runny nose, sore throat, and ear pain/pressure. Onset of symptoms was 1 week ago, somewhat improving since that time, but she just can't seem to get past this point. She is drinking moderate amounts of fluids. Evaluation to date: none. Treatment to date:  Advil . She does not smoke. Patient has not been fully vaccinated against COVID-19.  She also started having right eye swelling and erythema last night.   ROS: Negative unless specifically indicated above in HPI.   Relevant past medical history reviewed and updated as indicated.   Allergies and medications reviewed and updated.   Current Outpatient Medications:    aspirin EC 81 MG tablet, Take 81 mg by mouth daily., Disp: , Rfl:    atorvastatin (LIPITOR) 20 MG tablet, Take 1 tablet (20 mg total) by mouth daily., Disp: 90 tablet, Rfl: 1   cimetidine (TAGAMET) 200 MG tablet, Take 200 mg by mouth as needed. , Disp: ,  Rfl:    ibuprofen (ADVIL) 200 MG tablet, Take 200 mg by mouth every 6 (six) hours as needed for mild pain or moderate pain., Disp: , Rfl:    lisinopril (ZESTRIL) 5 MG tablet, Take 1 tablet (5 mg total) by mouth daily., Disp: 30 tablet, Rfl: 2   metFORMIN (GLUCOPHAGE) 500 MG tablet, Take 1 tablet (500 mg total) by mouth daily., Disp: 90 tablet, Rfl: 1   Semaglutide, 1 MG/DOSE, 4 MG/3ML SOPN, Inject 1 mg as directed once a week., Disp: 3 mL, Rfl: 2  Allergies  Allergen Reactions   Augmentin [Amoxicillin-Pot Clavulanate] Diarrhea and Nausea And Vomiting   Doxycycline     Abdominal cramps.   Topamax [Topiramate] Nausea And Vomiting    Objective:   BP 135/73    Pulse (!) 103    Temp (!) 97 F (36.1 C) (Temporal)    Ht 5\' 2"  (1.575 m)    Wt 258 lb 12.8 oz (117.4 kg)    SpO2 96%    BMI 47.34 kg/m    Physical Exam Vitals reviewed.  Constitutional:      General: She is not in acute distress.    Appearance: Normal appearance. She is not ill-appearing, toxic-appearing or diaphoretic.  HENT:     Head: Normocephalic and atraumatic.     Right Ear: Tympanic membrane, ear canal and external ear normal. There is no impacted cerumen.     Left Ear: Tympanic membrane, ear canal and external ear normal. There is no impacted cerumen.  Nose: Congestion present. No rhinorrhea.     Mouth/Throat:     Mouth: Mucous membranes are moist.     Pharynx: Oropharynx is clear. Posterior oropharyngeal erythema present. No oropharyngeal exudate.  Eyes:     General: No scleral icterus.       Right eye: No discharge.        Left eye: No discharge.     Conjunctiva/sclera:     Right eye: Right conjunctiva is injected.     Comments: Swelling to top and bottom eyelids of right eye.  Cardiovascular:     Rate and Rhythm: Normal rate and regular rhythm.     Heart sounds: Normal heart sounds. No murmur heard.   No friction rub. No gallop.  Pulmonary:     Effort: Pulmonary effort is normal. No respiratory distress.      Breath sounds: Normal breath sounds. No stridor. No wheezing, rhonchi or rales.  Musculoskeletal:        General: Normal range of motion.     Cervical back: Normal range of motion.  Lymphadenopathy:     Cervical: No cervical adenopathy.  Skin:    General: Skin is warm and dry.     Capillary Refill: Capillary refill takes less than 2 seconds.  Neurological:     General: No focal deficit present.     Mental Status: She is alert and oriented to person, place, and time. Mental status is at baseline.  Psychiatric:        Mood and Affect: Mood normal.        Behavior: Behavior normal.        Thought Content: Thought content normal.        Judgment: Judgment normal.

## 2021-07-16 ENCOUNTER — Encounter: Payer: Self-pay | Admitting: Family Medicine

## 2021-09-13 ENCOUNTER — Ambulatory Visit (INDEPENDENT_AMBULATORY_CARE_PROVIDER_SITE_OTHER): Payer: Commercial Managed Care - PPO | Admitting: Family Medicine

## 2021-09-13 ENCOUNTER — Encounter: Payer: Self-pay | Admitting: Family Medicine

## 2021-09-13 DIAGNOSIS — R509 Fever, unspecified: Secondary | ICD-10-CM | POA: Diagnosis not present

## 2021-09-13 DIAGNOSIS — R21 Rash and other nonspecific skin eruption: Secondary | ICD-10-CM | POA: Diagnosis not present

## 2021-09-13 DIAGNOSIS — M791 Myalgia, unspecified site: Secondary | ICD-10-CM

## 2021-09-13 LAB — VERITOR FLU A/B WAIVED
Influenza A: NEGATIVE
Influenza B: NEGATIVE

## 2021-09-13 LAB — CULTURE, GROUP A STREP

## 2021-09-13 LAB — RAPID STREP SCREEN (MED CTR MEBANE ONLY): Strep Gp A Ag, IA W/Reflex: NEGATIVE

## 2021-09-13 NOTE — Progress Notes (Signed)
? ?Virtual Visit via telephone Note ?Due to COVID-19 pandemic this visit was conducted virtually. This visit type was conducted due to national recommendations for restrictions regarding the COVID-19 Pandemic (e.g. social distancing, sheltering in place) in an effort to limit this patient's exposure and mitigate transmission in our community. All issues noted in this document were discussed and addressed.  A physical exam was not performed with this format.  ? ?I connected with Nicole Santos on 09/13/2021 at 1135 by telephone and verified that I am speaking with the correct person using two identifiers. Nicole Santos is currently located at home and family is currently with them during visit. The provider, Monia Pouch, FNP is located in their office at time of visit. ? ?I discussed the limitations, risks, security and privacy concerns of performing an evaluation and management service by virtual visit and the availability of in person appointments. I also discussed with the patient that there may be a patient responsible charge related to this service. The patient expressed understanding and agreed to proceed. ? ?Subjective:  ?Patient ID: Nicole Santos, female    DOB: 01-04-1971, 51 y.o.   MRN: [id number] ? ?Chief Complaint:  Fever, Rash, and Headache ? ? ?HPI: ?Nicole Santos is a 51 y.o. female presenting on 09/13/2021 for Fever, Rash, and Headache ? ? ?Fever  ?This is a new problem. The current episode started in the past 7 days. The problem has been waxing and waning. The maximum temperature noted was 101 to 101.9 F. The temperature was taken using an oral thermometer. Associated symptoms include congestion, coughing, headaches, muscle aches and a rash (describes as patchy, red rash). Pertinent negatives include no abdominal pain, chest pain, diarrhea, ear pain, nausea, sleepiness, sore throat, urinary pain, vomiting or wheezing. She has tried acetaminophen and NSAIDs for the symptoms. The treatment provided mild relief.   ?Risk factors: sick contacts   ?Rash ?This is a new problem. The current episode started yesterday. The problem is unchanged. The affected locations include the left arm, right arm, right lower leg and left lower leg. The rash is characterized by redness. She was exposed to an ill contact. Associated symptoms include congestion, coughing, a fever and rhinorrhea. Pertinent negatives include no anorexia, diarrhea, eye pain, facial edema, fatigue, joint pain, nail changes, shortness of breath, sore throat or vomiting. Past treatments include nothing.  ?Headache  ?This is a new problem. The current episode started in the past 7 days. The problem occurs intermittently. The problem has been waxing and waning. The pain is located in the Bilateral region. The pain does not radiate. The quality of the pain is described as dull. The pain is mild. Associated symptoms include coughing, a fever, muscle aches and rhinorrhea. Pertinent negatives include no abdominal pain, abnormal behavior, anorexia, back pain, blurred vision, dizziness, drainage, ear pain, eye pain, eye redness, eye watering, facial sweating, hearing loss, insomnia, loss of balance, nausea, neck pain, numbness, phonophobia, photophobia, scalp tenderness, seizures, sinus pressure, sore throat, swollen glands, tingling, tinnitus, visual change, vomiting, weakness or weight loss. Nothing aggravates the symptoms. She has tried acetaminophen and NSAIDs for the symptoms. The treatment provided mild relief.  ? ? ?Relevant past medical, surgical, family, and social history reviewed and updated as indicated.  ?Allergies and medications reviewed and updated. ? ? ?Past Medical History:  ?Diagnosis Date  ? Acute respiratory disease due to COVID-19 virus 02/02/2019  ? Cancer [facility])   ? Diabetes mellitus without complication ([place])   ? Hypertension   ? ? ?  Past Surgical History:  ?Procedure Laterality Date  ? KNEE SURGERY Right   ? Arthroscopic  ? TONSILLECTOMY AND ADENOIDECTOMY     ? ? ?Social History  ? ?Socioeconomic History  ? Marital status: Married  ?  Spouse name: Not on file  ? Number of children: Not on file  ? Years of education: Not on file  ? Highest education level: Not on file  ?Occupational History  ? Occupation: Unemployed  ?Tobacco Use  ? Smoking status: Never  ? Smokeless tobacco: Never  ?Vaping Use  ? Vaping Use: Never used  ?Substance and Sexual Activity  ? Alcohol use: No  ? Drug use: No  ? Sexual activity: Not on file  ?Other Topics Concern  ? Not on file  ?Social History Narrative  ? Not on file  ? ?Social Determinants of Health  ? ?Financial Resource Strain: Not on file  ?Food Insecurity: Not on file  ?Transportation Needs: Not on file  ?Physical Activity: Not on file  ?Stress: Not on file  ?Social Connections: Not on file  ?Intimate Partner Violence: Not on file  ? ? ?Outpatient Encounter Medications as of 09/13/2021  ?Medication Sig  ? aspirin EC 81 MG tablet Take 81 mg by mouth daily.  ? atorvastatin (LIPITOR) 20 MG tablet Take 1 tablet (20 mg total) by mouth daily.  ? azithromycin (ZITHROMAX Z-PAK) 250 MG tablet Take 2 tablets (500 mg) PO today, then 1 tablet (250 mg) PO daily x4 days.  ? cimetidine (TAGAMET) 200 MG tablet Take 200 mg by mouth as needed.   ? ibuprofen (ADVIL) 200 MG tablet Take 200 mg by mouth every 6 (six) hours as needed for mild pain or moderate pain.  ? lisinopril (ZESTRIL) 5 MG tablet Take 1 tablet (5 mg total) by mouth daily.  ? metFORMIN (GLUCOPHAGE) 500 MG tablet Take 1 tablet (500 mg total) by mouth daily.  ? Semaglutide, 1 MG/DOSE, 4 MG/3ML SOPN Inject 1 mg as directed once a week.  ? ?No facility-administered encounter medications on file as of 09/13/2021.  ? ? ?Allergies  ?Allergen Reactions  ? Augmentin [Amoxicillin-Pot Clavulanate] Diarrhea and Nausea And Vomiting  ? Doxycycline   ?  Abdominal cramps.  ? Topamax [Topiramate] Nausea And Vomiting  ? ? ?Review of Systems  ?Constitutional:  Positive for chills and fever. Negative for  activity change, appetite change, diaphoresis, fatigue, unexpected weight change and weight loss.  ?HENT:  Positive for congestion, postnasal drip and rhinorrhea. Negative for dental problem, drooling, ear discharge, ear pain, facial swelling, hearing loss, mouth sores, nosebleeds, sinus pressure, sinus pain, sneezing, sore throat, tinnitus, trouble swallowing and voice change.   ?Eyes:  Negative for blurred vision, photophobia, pain and redness.  ?Respiratory:  Positive for cough. Negative for shortness of breath and wheezing.   ?Cardiovascular:  Negative for chest pain.  ?Gastrointestinal:  Negative for abdominal distention, abdominal pain, anal bleeding, anorexia, blood in stool, constipation, diarrhea, nausea, rectal pain and vomiting.  ?Genitourinary:  Negative for decreased urine volume, difficulty urinating and dysuria.  ?Musculoskeletal:  Negative for back pain, gait problem, joint pain, joint swelling, myalgias, neck pain and neck stiffness.  ?Skin:  Positive for rash (describes as patchy, red rash). Negative for nail changes.  ?Neurological:  Positive for headaches. Negative for dizziness, tingling, tremors, seizures, syncope, facial asymmetry, speech difficulty, weakness, light-headedness, numbness and loss of balance.  ?Psychiatric/Behavioral:  Negative for confusion. The patient does not have insomnia.   ?All other systems reviewed and are negative. ? ?   ? ? ?  Observations/Objective: ?No vital signs or physical exam, this was a virtual health encounter.  ?Pt alert and oriented, answers all questions appropriately, and able to speak in full sentences.  ? ? ?Assessment and Plan: ?Brion was seen today for fever, rash and headache. ? ?Diagnoses and all orders for this visit: ? ?Fever and chills ?Myalgia ?Rash in adult ?Recently exposed to sick grandchildren who had tonsillitis. Will test for below and treat per results. Rash is not purple or pruritic. No nuchal rigidity. Adequate hydration and fever control  discussed in detail. Symptomatic care discussed. Report any new, worsening, or persistent symptoms  ?-     Veritor Flu A/B Waived ?-     Rapid Strep Screen ([facility] ONLY) ?-     Novel Coronavirus, NA

## 2021-09-14 LAB — NOVEL CORONAVIRUS, NAA: SARS-CoV-2, NAA: NOT DETECTED

## 2021-09-19 ENCOUNTER — Ambulatory Visit: Payer: Commercial Managed Care - PPO | Admitting: Family Medicine

## 2021-09-19 ENCOUNTER — Encounter: Payer: Self-pay | Admitting: Family Medicine

## 2021-09-19 VITALS — BP 109/74 | HR 106 | Temp 97.3°F | Ht 62.0 in | Wt 244.4 lb

## 2021-09-19 DIAGNOSIS — E1165 Type 2 diabetes mellitus with hyperglycemia: Secondary | ICD-10-CM

## 2021-09-19 DIAGNOSIS — Z6841 Body Mass Index (BMI) 40.0 and over, adult: Secondary | ICD-10-CM

## 2021-09-19 DIAGNOSIS — B09 Unspecified viral infection characterized by skin and mucous membrane lesions: Secondary | ICD-10-CM

## 2021-09-19 DIAGNOSIS — E1169 Type 2 diabetes mellitus with other specified complication: Secondary | ICD-10-CM

## 2021-09-19 DIAGNOSIS — I10 Essential (primary) hypertension: Secondary | ICD-10-CM | POA: Diagnosis not present

## 2021-09-19 DIAGNOSIS — E785 Hyperlipidemia, unspecified: Secondary | ICD-10-CM

## 2021-09-19 LAB — BAYER DCA HB A1C WAIVED: HB A1C (BAYER DCA - WAIVED): 5.7 % — ABNORMAL HIGH (ref 4.8–5.6)

## 2021-09-19 MED ORDER — PREDNISONE 10 MG (21) PO TBPK: ORAL_TABLET | ORAL | 0 refills | Status: DC

## 2021-09-19 MED ORDER — ATORVASTATIN CALCIUM 20 MG PO TABS: 20.0000 mg | ORAL_TABLET | Freq: Every day | ORAL | 1 refills | Status: DC

## 2021-09-19 MED ORDER — SEMAGLUTIDE (2 MG/DOSE) 8 MG/3ML ~~LOC~~ SOPN: 2.0000 mg | PEN_INJECTOR | SUBCUTANEOUS | 1 refills | Status: DC

## 2021-09-19 NOTE — Patient Instructions (Signed)
Stop metformin. ? ?Schedule your eye exam and pap smear.  ? ?Complete your Cologuard. ?

## 2021-09-19 NOTE — Progress Notes (Signed)
? ?Assessment & Plan:  ?1. Type 2 Nicole Santos mellitus with hyperglycemia, without long-term current use of insulin ([place]) ?Lab Results  ?Component Value Date  ? HGBA1C 5.7 (H) 09/19/2021  ? HGBA1C 6.0 (H) 06/21/2021  ? HGBA1C 7.1 (H) 03/28/2021  ?  ?- Nicole Santos is at goal of A1c < 7. ?- Medications:  Increase Ozempic from 29m to 2 mg weekly; stop metformin. ?- Home glucose monitoring: Encouraged to monitor ?- Patient is currently taking a statin. Patient is taking an ACE-inhibitor/ARB.  ?- Instruction/counseling given: reminded to get eye exam ? ?Nicole Santos Health Maintenance Due  ?Topic Date Due  ? OPHTHALMOLOGY EXAM  Never done  ? HEMOGLOBIN A1C  03/21/2022  ? FOOT EXAM  06/21/2022  ?  ?Lab Results  ?Component Value Date  ? LABMICR <3.0 03/28/2021  ? LABMICR 3.6 02/19/2020  ? ?- Lipid panel ?- CBC with Differential/Platelet ?- CMP14+EGFR ?- Bayer DCA Hb A1c Waived ?- Nicole Santos (LIPITOR) 20 MG tablet; Take 1 tablet (20 mg total) by mouth daily.  Dispense: 90 tablet; Refill: 1 ?- Semaglutide, 2 MG/DOSE, 8 MG/3ML SOPN; Inject 2 mg as directed once a week.  Dispense: 9 mL; Refill: 1 ? ?2. Hyperlipidemia associated with type 2 [place] mellitus [place] ?Well controlled on current regimen.  ?- Lipid panel ?- CBC with Differential/Platelet ?- CMP14+EGFR ?- Nicole Santos (LIPITOR) 20 MG tablet; Take 1 tablet (20 mg total) by mouth daily.  Dispense: 90 tablet; Refill: 1 ? ?3. Essential hypertension ?Well controlled on current regimen.  ?- Lipid panel ?- CBC with Differential/Platelet ?- CMP14+EGFR ? ?4. Morbid obesity with BMI of 40.0-44.9, adult [place] ?Congratulated on 19 lb weight loss over the past 3 months.  Ozempic increased from 1 mg to 2 mg weekly. ?- Lipid panel ?- CBC with Differential/Platelet ?- CMP14+EGFR ?- Semaglutide, 2 MG/DOSE, 8 MG/3ML SOPN; Inject 2 mg as directed once a week.  Dispense: 9 mL; Refill: 1 ? ?5. Viral exanthem ?- predniSONE (STERAPRED UNI-PAK 21 TAB) 10 MG (21) TBPK tablet; As directed x 6 days   Dispense: 21 tablet; Refill: 0 ? ? ?Return in about 3 months (around 12/19/2021) for follow-up of chronic medication conditions. ? ?Nicole Santos ?[name]? ?Subjective:  ? ? Patient ID: Nicole Santos female    DOB: [birth year] 51y.o.   MRN: [id number]? ?Patient Care Team: ?Nicole Santos as PCP - General (Family Medicine) ?L[facility] Santos as Referring Physician (Optometry)  ? ?Chief Complaint:  ?Chief Complaint  ?Patient presents with  ? Medical Management of Chronic Issues  ? Rash  ?  Patient states that on the 7th she got fever and head ache and then the rash popped up. Patient has been fever free x 24 hours.  The rash is still present on bilateral legs and arms.  Also having joint pain.   ? ? ?HPI: ?Nicole Santos a 51y.o. female presenting on 09/19/2021 for Medical Management of Chronic Issues and Rash (Patient states that on the 7th she got fever and head ache and then the rash popped up. Patient has been fever free x 24 hours.  The rash is still present on bilateral legs and arms.  Also having joint pain. ) ? ?Obesity: She was increased to 1 mg Ozempic at her last visit to improve glycemic control and weight loss. She states she is tolerating it well. She has lost 19 lbs in the past three months.  ? ?Nicole Santos: Patient presents  for follow up of Nicole Santos. Current symptoms include: hyperglycemia. Known diabetic complications: cardiovascular disease. Medication compliance: Yes, metformin 500 mg daily, Ozempic 1 mg weekly. Current diet: in general, a "healthy" diet  . Current exercise: none. Home blood sugar records:  does not consisently check . Is she  on ACE inhibitor or angiotensin II receptor blocker? Yes (Nicole Santos). Is she on a statin? Yes (Nicole Santos).  ? 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 She states that she worries about her kids mostly, but that she does not feel out of control.  Previously failed  treatment with Zoloft. She states she does not need a medication for this at this time, but will reach out of it gets unmanageable.  ? ? ?  09/19/2021  ? 11:24 AM 07/13/2021  ?  4:20 PM 06/21/2021  ? 10:05 AM  ?Depression screen PHQ 2/9  ?Decreased Interest 3 0 1  ?Down, Depressed, Hopeless 1 1 1   ?PHQ - 2 Score 4 1 2   ?Altered sleeping 2 2 1   ?Tired, decreased energy 3 1 1   ?Change in appetite 3 0 0  ?Feeling bad or failure about yourself  1 1 1   ?Trouble concentrating 0 0 0  ?Moving slowly or fidgety/restless 2 0 0  ?Suicidal thoughts 0 0 0  ?PHQ-9 Score 15 5 5   ?Difficult doing work/chores Somewhat difficult Not difficult at all Not difficult at all  ? ? ?  09/19/2021  ? 11:25 AM 07/13/2021  ?  4:20 PM 06/21/2021  ? 10:05 AM 03/28/2021  ?  1:23 PM  ?GAD 7 : Generalized Anxiety Score  ?Nervous, Anxious, on Edge 0 0 1 0  ?Control/stop worrying 2 3 3 3   ?Worry too much - different things 2 3 3 3   ?Trouble relaxing 2 0 0 0  ?Restless 0 0 0 0  ?Easily annoyed or irritable 3 0 2 3  ?Afraid - awful might happen 1 2 2 3   ?Total GAD 7 Score 10 8 11 12   ?Anxiety Difficulty Somewhat difficult Not difficult at all Not difficult at all Not difficult at all  ? ? ?New complaints: ?Patient reports a rash that has been present for over a week.  States her family has been passing around a respiratory infection.  On 09/08/2021 she developed fever, headache, and joint pain; the rash appeared after that.  She reports she has been fever free x24 hours.  The rash initially went from her forearms up to her chest and her lower legs up to her thighs, but has not spread any more since then.  Denies any itching or irritation.  She was seen on 09/13/2021 at which time she tested negative for influenza, COVID-19, and strep. ? ? ?Social history: ? ?Relevant past medical, surgical, family and social history reviewed and updated as indicated. Interim medical history since our last visit reviewed. ? ?Allergies and medications reviewed and updated. ? ?DATA  REVIEWED: CHART IN EPIC ? ?ROS: Negative unless specifically indicated above in HPI.  ? ? ?Current Outpatient Medications:  ?  aspirin EC 81 MG tablet, Take 81 mg by mouth daily., Disp: , Rfl:  ?  Nicole Santos (LIPITOR) 20 MG tablet, Take 1 tablet (20 mg total) by mouth daily., Disp: 90 tablet, Rfl: 1 ?  cimetidine (TAGAMET) 200 MG tablet, Take 200 mg by mouth as needed. , Disp: , Rfl:  ?  ibuprofen (ADVIL) 200 MG tablet, Take 200 mg by mouth every 6 (six) hours  as needed for mild pain or moderate pain., Disp: , Rfl:  ?  Nicole Santos (ZESTRIL) 5 MG tablet, Take 1 tablet (5 mg total) by mouth daily., Disp: 30 tablet, Rfl: 2 ?  metFORMIN (GLUCOPHAGE) 500 MG tablet, Take 1 tablet (500 mg total) by mouth daily., Disp: 90 tablet, Rfl: 1 ?  Semaglutide, 1 MG/DOSE, 4 MG/3ML SOPN, Inject 1 mg as directed once a week., Disp: 3 mL, Rfl: 2  ? ?Allergies  ?Allergen Reactions  ? Augmentin [Amoxicillin-Pot Clavulanate] Diarrhea and Nausea And Vomiting  ? Doxycycline   ?  Abdominal cramps.  ? Topamax [Topiramate] Nausea And Vomiting  ? ?Past Medical History:  ?Diagnosis Date  ? Acute respiratory disease due to COVID-19 virus 02/02/2019  ? Cancer [facility])   ? Nicole Santos mellitus without complication ([place])   ? Hypertension   ?  ?Past Surgical History:  ?Procedure Laterality Date  ? KNEE SURGERY Right   ? Arthroscopic  ? TONSILLECTOMY AND ADENOIDECTOMY    ?  ?Social History  ? ?Socioeconomic History  ? Marital status: Married  ?  Spouse name: Not on file  ? Number of children: Not on file  ? Years of education: Not on file  ? Highest education level: Not on file  ?Occupational History  ? Occupation: Unemployed  ?Tobacco Use  ? Smoking status: Never  ? Smokeless tobacco: Never  ?Vaping Use  ? Vaping Use: Never used  ?Substance and Sexual Activity  ? Alcohol use: No  ? Drug use: No  ? Sexual activity: Not on file  ?Other Topics Concern  ? Not on file  ?Social History Narrative  ? Not on file  ? ?Social Determinants of Health  ? ?Financial  Resource Strain: Not on file  ?Food Insecurity: Not on file  ?Transportation Needs: Not on file  ?Physical Activity: Not on file  ?Stress: Not on file  ?Social Connections: Not on file  ?Intimate Partner Viole

## 2021-09-20 ENCOUNTER — Encounter: Payer: Self-pay | Admitting: Family Medicine

## 2021-09-20 LAB — LIPID PANEL
Chol/HDL Ratio: 4 ratio (ref 0.0–4.4)
Cholesterol, Total: 141 mg/dL (ref 100–199)
HDL: 35 mg/dL — ABNORMAL LOW (ref >39)
LDL Chol Calc (NIH): 83 mg/dL (ref 0–99)
Triglycerides: 130 mg/dL (ref 0–149)
VLDL Cholesterol Cal: 23 mg/dL (ref 5–40)

## 2021-09-20 LAB — CMP14+EGFR
ALT: 41 IU/L — ABNORMAL HIGH (ref 0–32)
AST: 42 IU/L — ABNORMAL HIGH (ref 0–40)
Albumin/Globulin Ratio: 1.5 (ref 1.2–2.2)
Albumin: 3.8 g/dL (ref 3.8–4.9)
Alkaline Phosphatase: 61 IU/L (ref 44–121)
BUN/Creatinine Ratio: 10 (ref 9–23)
BUN: 8 mg/dL (ref 6–24)
Bilirubin Total: 0.8 mg/dL (ref 0.0–1.2)
CO2: 22 mmol/L (ref 20–29)
Calcium: 8.9 mg/dL (ref 8.7–10.2)
Chloride: 99 mmol/L (ref 96–106)
Creatinine, Ser: 0.82 mg/dL (ref 0.57–1.00)
Globulin, Total: 2.6 g/dL (ref 1.5–4.5)
Glucose: 108 mg/dL — ABNORMAL HIGH (ref 70–99)
Potassium: 4.7 mmol/L (ref 3.5–5.2)
Sodium: 138 mmol/L (ref 134–144)
Total Protein: 6.4 g/dL (ref 6.0–8.5)
eGFR: 85 mL/min/{1.73_m2} (ref >59)

## 2021-09-20 LAB — CBC WITH DIFFERENTIAL/PLATELET
Basophils Absolute: 0 10*3/uL (ref 0.0–0.2)
Basos: 1 %
EOS (ABSOLUTE): 0.1 10*3/uL (ref 0.0–0.4)
Eos: 2 %
Hematocrit: 40.7 % (ref 34.0–46.6)
Hemoglobin: 13.8 g/dL (ref 11.1–15.9)
Immature Grans (Abs): 0 10*3/uL (ref 0.0–0.1)
Immature Granulocytes: 1 %
Lymphocytes Absolute: 1.2 10*3/uL (ref 0.7–3.1)
Lymphs: 25 %
MCH: 29.1 pg (ref 26.6–33.0)
MCHC: 33.9 g/dL (ref 31.5–35.7)
MCV: 86 fL (ref 79–97)
Monocytes Absolute: 0.6 10*3/uL (ref 0.1–0.9)
Monocytes: 12 %
Neutrophils Absolute: 2.9 10*3/uL (ref 1.4–7.0)
Neutrophils: 59 %
Platelets: 415 10*3/uL (ref 150–450)
RBC: 4.74 x10E6/uL (ref 3.77–5.28)
RDW: 12.4 % (ref 11.7–15.4)
WBC: 4.7 10*3/uL (ref 3.4–10.8)

## 2021-09-25 ENCOUNTER — Other Ambulatory Visit: Payer: Self-pay | Admitting: Family Medicine

## 2021-09-25 DIAGNOSIS — E1165 Type 2 diabetes mellitus with hyperglycemia: Secondary | ICD-10-CM

## 2021-10-30 ENCOUNTER — Encounter: Payer: Self-pay | Admitting: Family Medicine

## 2021-11-03 ENCOUNTER — Encounter: Payer: Self-pay | Admitting: Nurse Practitioner

## 2021-11-03 ENCOUNTER — Ambulatory Visit (HOSPITAL_COMMUNITY)
Admission: RE | Admit: 2021-11-03 | Discharge: 2021-11-03 | Disposition: A | Discharge status: Home / Self Care | Payer: Commercial Managed Care - PPO | Source: Ambulatory Visit | Attending: Nurse Practitioner | Admitting: Nurse Practitioner

## 2021-11-03 ENCOUNTER — Ambulatory Visit: Payer: Commercial Managed Care - PPO | Admitting: Nurse Practitioner

## 2021-11-03 VITALS — BP 140/97 | HR 139 | Ht 62.0 in | Wt 234.0 lb

## 2021-11-03 DIAGNOSIS — R1011 Right upper quadrant pain: Secondary | ICD-10-CM | POA: Insufficient documentation

## 2021-11-03 DIAGNOSIS — R300 Dysuria: Secondary | ICD-10-CM | POA: Diagnosis not present

## 2021-11-03 LAB — URINALYSIS, ROUTINE W REFLEX MICROSCOPIC
Bilirubin, UA: NEGATIVE
Glucose, UA: NEGATIVE
Leukocytes,UA: NEGATIVE
Nitrite, UA: POSITIVE — AB
RBC, UA: NEGATIVE
Specific Gravity, UA: 1.025 (ref 1.005–1.030)
Urobilinogen, Ur: 1 mg/dL (ref 0.2–1.0)
pH, UA: 6 (ref 5.0–7.5)

## 2021-11-03 LAB — MICROSCOPIC EXAMINATION
RBC, Urine: NONE SEEN /hpf (ref 0–2)
Renal Epithel, UA: NONE SEEN /hpf

## 2021-11-03 MED ORDER — NITROFURANTOIN MONOHYD MACRO 100 MG PO CAPS: 100.0000 mg | ORAL_CAPSULE | Freq: Two times a day (BID) | ORAL | 0 refills | Status: DC

## 2021-11-03 NOTE — Progress Notes (Addendum)
Acute Office Visit  Subjective:     Patient ID: Nicole Santos, female    DOB: [birth year], 51 y.o.   MRN: [id number]  Chief Complaint  Patient presents with   Nausea    Pain in stomach as well    Abdominal Pain This is a new problem. The pain is at a severity of 8/10. The pain is severe. The quality of the pain is aching. The abdominal pain radiates to the RUQ. Associated symptoms include dysuria. Pertinent negatives include no nausea. The pain is aggravated by movement and palpation. The pain is relieved by Nothing. There is no history of GERD.  Dysuria  This is a new problem. The current episode started in the past 7 days. The problem occurs intermittently. The problem has been unchanged. The patient is experiencing no pain. There has been no fever. Pertinent negatives include no chills, discharge, flank pain or nausea. Associated symptoms comments: Cloudy/dark urine. She has tried nothing for the symptoms.    Review of Systems  Constitutional: Negative.  Negative for chills.  HENT: Negative.    Respiratory: Negative.    Cardiovascular: Negative.   Gastrointestinal:  Positive for abdominal pain. Negative for nausea.  Genitourinary:  Positive for dysuria. Negative for flank pain.  Skin: Negative.  Negative for rash.  All other systems reviewed and are negative.      Objective:    BP (!) 140/97   Pulse (!) 139   Ht '5\' 2"'$  (1.575 m)   Wt 234 lb (106.1 kg)   SpO2 94%   BMI 42.80 kg/m  BP Readings from Last 3 Encounters:  11/03/21 (!) 140/97  09/19/21 109/74  07/13/21 135/73   Wt Readings from Last 3 Encounters:  11/03/21 234 lb (106.1 kg)  09/19/21 244 lb 6.4 oz (110.9 kg)  07/13/21 258 lb 12.8 oz (117.4 kg)      Physical Exam Vitals and nursing note reviewed.  Constitutional:      Appearance: Normal appearance. She is obese.  HENT:     Head: Normocephalic.     Right Ear: External ear normal.     Left Ear: External ear normal.     Nose: Nose normal.      Mouth/Throat:     Mouth: Mucous membranes are moist.     Pharynx: Oropharynx is clear.  Eyes:     Conjunctiva/sclera: Conjunctivae normal.  Cardiovascular:     Rate and Rhythm: Normal rate and regular rhythm.     Pulses: Normal pulses.     Heart sounds: Normal heart sounds.  Pulmonary:     Effort: Pulmonary effort is normal.     Breath sounds: Normal breath sounds.  Abdominal:     General: Bowel sounds are normal. There is no distension.     Tenderness: There is abdominal tenderness.  Skin:    General: Skin is warm.     Findings: No rash.  Neurological:     General: No focal deficit present.     Mental Status: She is alert and oriented to person, place, and time.    Results for orders placed or performed in visit on 11/03/21  Microscopic Examination   Urine  Result Value Ref Range   WBC, UA 0-5 0 - 5 /hpf   RBC None seen 0 - 2 /hpf   Epithelial Cells (non renal) 0-10 0 - 10 /hpf   Renal Epithel, UA None seen None seen /hpf   Mucus, UA Present (A) Not Estab.   Bacteria, UA  Few (A) None seen/Few  Urinalysis, Routine w reflex microscopic  Result Value Ref Range   Specific Gravity, UA 1.025 1.005 - 1.030   pH, UA 6.0 5.0 - 7.5   Color, UA Yellow Yellow   Appearance Ur Clear Clear   Leukocytes,UA Negative Negative   Protein,UA 2+ (A) Negative/Trace   Glucose, UA Negative Negative   Ketones, UA 2+ (A) Negative   RBC, UA Negative Negative   Bilirubin, UA Negative Negative   Urobilinogen, Ur 1.0 0.2 - 1.0 mg/dL   Nitrite, UA Positive (A) Negative   Microscopic Examination See below:         Assessment & Plan:   UTI  vs  interstitial cystitis -urinalysis completed results pending -pyridium for pain -Macrobid 100 mg tablet by mouth twice daily Precaution and education provided -All questions answered -follow up with unresolved symptoms   Abdominal pain, uncontrolled symptoms worse right upper quadrant.  Palpable mass right upper quadrant tender to touch.  Patient  reports vomiting currently watery liquid in the past 4 days, diarrhea and lack of appetite.  No fever associated with current symptoms. Patient reports not drinking enough water and maybe drinks a total of 16 hours in 24 hours.  Currently on Ozempic for the past 1 year. Completed abdominal x-ray results pending. Ultrasound required in the future to rule out pancreatitis or gallbladder.   Problem List Items Addressed This Visit   None Visit Diagnoses     Right upper quadrant abdominal pain    -  Primary   Relevant Orders   US Abdomen Complete   DG Abd 1 View   Dysuria       Relevant Medications   nitrofurantoin, macrocrystal-monohydrate, (MACROBID) 100 MG capsule   Other Relevant Orders   Urinalysis, Routine w reflex microscopic (Completed)   Microscopic Examination (Completed)   CULTURE, URINE COMPREHENSIVE       Meds ordered this encounter  Medications   nitrofurantoin, macrocrystal-monohydrate, (MACROBID) 100 MG capsule    Sig: Take 1 capsule (100 mg total) by mouth 2 (two) times daily. 1 po BId    Dispense:  14 capsule    Refill:  0    Order Specific Question:   Supervising Provider    Answer:   Claretta Fraise [[id number]]    Return if symptoms worsen or fail to improve.  Ivy Lynn, NP

## 2021-11-03 NOTE — Addendum Note (Signed)
Addended by: Ivy Lynn on: 11/03/2021 05:05 PM   Modules accepted: Orders

## 2021-11-03 NOTE — Patient Instructions (Addendum)
Abdominal Pain, Adult Many things can cause belly (abdominal) pain. Most times, belly pain is not dangerous. Many cases of belly pain can be watched and treated at home. Sometimes, though, belly pain is serious. Your doctor will try to find the cause of your belly pain. Follow these instructions at home:  Medicines Take over-the-counter and prescription medicines only as told by your doctor. Do not take medicines that help you poop (laxatives) unless told by your doctor. General instructions Watch your belly pain for any changes. Drink enough fluid to keep your pee (urine) pale yellow. Keep all follow-up visits as told by your doctor. This is important. Contact a doctor if: Your belly pain changes or gets worse. You are not hungry, or you lose weight without trying. You are having trouble pooping (constipated) or have watery poop (diarrhea) for more than 2-3 days. You have pain when you pee or poop. Your belly pain wakes you up at night. Your pain gets worse with meals, after eating, or with certain foods. You are vomiting and cannot keep anything down. You have a fever. You have blood in your pee. Get help right away if: Your pain does not go away as soon as your doctor says it should. You cannot stop vomiting. Your pain is only in areas of your belly, such as the right side or the left lower part of the belly. You have bloody or black poop, or poop that looks like tar. You have very bad pain, cramping, or bloating in your belly. You have signs of not having enough fluid or water in your body (dehydration), such as: Dark pee, very little pee, or no pee. Cracked lips. Dry mouth. Sunken eyes. Sleepiness. Weakness. You have trouble breathing or chest pain. Summary Many cases of belly pain can be watched and treated at home. Watch your belly pain for any changes. Take over-the-counter and prescription medicines only as told by your doctor. Contact a doctor if your belly pain  changes or gets worse. Get help right away if you have very bad pain, cramping, or bloating in your belly. This information is not intended to replace advice given to you by your health care provider. Make sure you discuss any questions you have with your health care provider. Document Revised: 09/29/2018 Document Reviewed: 09/29/2018 Elsevier Patient Education  2023 [employer]  

## 2021-11-06 ENCOUNTER — Telehealth: Payer: Self-pay | Admitting: Family Medicine

## 2021-11-06 ENCOUNTER — Other Ambulatory Visit: Payer: Self-pay | Admitting: Nurse Practitioner

## 2021-11-06 DIAGNOSIS — R9389 Abnormal findings on diagnostic imaging of other specified body structures: Secondary | ICD-10-CM

## 2021-11-06 DIAGNOSIS — R1011 Right upper quadrant pain: Secondary | ICD-10-CM

## 2021-11-06 NOTE — Telephone Encounter (Signed)
   Gallstones found on abdominal x-ray.  I have completed a STAT referral to GI   Thank you

## 2021-11-06 NOTE — Telephone Encounter (Signed)
PATIENT AWARE

## 2021-11-06 NOTE — Telephone Encounter (Signed)
Please call pt with xray result from 11/03/21 ASAP

## 2021-11-07 ENCOUNTER — Telehealth: Payer: Self-pay | Admitting: Family Medicine

## 2021-11-07 NOTE — Telephone Encounter (Signed)
Gallstones were found on patient's x-ray and a STAT surgical referral was completed yesterday.  Nicole Santos was in touch with patient yesterday. Please let's make sure patient has the right information. Ultrasound is not scheduled until later and patient may not needed after general surgery referral is completed.  Tylenol/ibuprofen as needed for pain

## 2021-11-07 NOTE — Telephone Encounter (Signed)
Patient seen Nicole Santos on 6/2 please advise

## 2021-11-08 ENCOUNTER — Ambulatory Visit (HOSPITAL_COMMUNITY)
Admission: RE | Admit: 2021-11-08 | Discharge: 2021-11-08 | Disposition: A | Discharge status: Home / Self Care | Payer: Commercial Managed Care - PPO | Source: Ambulatory Visit | Attending: Nurse Practitioner | Admitting: Nurse Practitioner

## 2021-11-08 DIAGNOSIS — R1011 Right upper quadrant pain: Secondary | ICD-10-CM | POA: Diagnosis not present

## 2021-11-09 ENCOUNTER — Encounter: Payer: Self-pay | Admitting: General Surgery

## 2021-11-09 ENCOUNTER — Ambulatory Visit: Payer: Commercial Managed Care - PPO | Admitting: General Surgery

## 2021-11-09 VITALS — BP 138/99 | HR 126 | Temp 98.1°F | Resp 12 | Ht 62.0 in | Wt 238.0 lb

## 2021-11-09 DIAGNOSIS — K8020 Calculus of gallbladder without cholecystitis without obstruction: Secondary | ICD-10-CM

## 2021-11-09 NOTE — Progress Notes (Signed)
Nicole Santos; [id number]; 09-04-1970   HPI Patient is a 51 year old white female who was referred to my care by Hendricks Limes for evaluation treatment of biliary colic secondary to cholelithiasis.  Patient has had epigastric and right upper quadrant abdominal pain with radiation to her back, nausea, and an episode of vomiting since last month.  She denies any fever, chills, jaundice.  All foods seem to bother her at the present time.  She is on a clear liquid diet.  The episodes have increased in frequency and intensity.  She had an ultrasound of the gallbladder which revealed cholelithiasis with mildly thickened gallbladder wall and a normal common bile duct.  She did have a positive Murphy sign. Patient states she is chronically tachycardic. Past Medical History:  Diagnosis Date   Acute respiratory disease due to COVID-19 virus 02/02/2019   Cancer ([place])    Diabetes mellitus without complication ([place])    Hypertension     Past Surgical History:  Procedure Laterality Date   KNEE SURGERY Right    Arthroscopic   TONSILLECTOMY AND ADENOIDECTOMY      Family History  Problem Relation Age of Onset   Hypertension Mother    Breast cancer Maternal Great-grandmother     Current Outpatient Medications on File Prior to Visit  Medication Sig Dispense Refill   aspirin EC 81 MG tablet Take 81 mg by mouth daily.     atorvastatin (LIPITOR) 20 MG tablet Take 1 tablet (20 mg total) by mouth daily. 90 tablet 1   cimetidine (TAGAMET) 200 MG tablet Take 200 mg by mouth as needed.      ibuprofen (ADVIL) 200 MG tablet Take 200 mg by mouth every 6 (six) hours as needed for mild pain or moderate pain.     nitrofurantoin, macrocrystal-monohydrate, (MACROBID) 100 MG capsule Take 1 capsule (100 mg total) by mouth 2 (two) times daily. 1 po BId 14 capsule 0   Semaglutide, 2 MG/DOSE, 8 MG/3ML SOPN Inject 2 mg as directed once a week. 9 mL 1   lisinopril (ZESTRIL) 5 MG tablet Take 1 tablet (5 mg total) by mouth daily.  (Patient not taking: Reported on 11/09/2021) 30 tablet 2   No current facility-administered medications on file prior to visit.    Allergies  Allergen Reactions   Augmentin [Amoxicillin-Pot Clavulanate] Diarrhea and Nausea And Vomiting   Doxycycline     Abdominal cramps.   Topamax [Topiramate] Nausea And Vomiting    Social History   Substance and Sexual Activity  Alcohol Use No    Social History   Tobacco Use  Smoking Status Never  Smokeless Tobacco Never    Review of Systems  Constitutional:  Positive for malaise/fatigue.  HENT: Negative.    Eyes: Negative.   Respiratory: Negative.    Cardiovascular: Negative.   Gastrointestinal:  Positive for abdominal pain and heartburn.  Genitourinary:  Positive for frequency.  Musculoskeletal: Negative.   Skin: Negative.   Neurological: Negative.   Endo/Heme/Allergies: Negative.   Psychiatric/Behavioral: Negative.      Objective   Vitals:   11/09/21 1027  BP: (!) 138/99  Pulse: (!) 126  Resp: 12  Temp: 98.1 F (36.7 C)  SpO2: 93%    Physical Exam Vitals reviewed.  Constitutional:      Appearance: Normal appearance. She is obese. She is not ill-appearing.  HENT:     Head: Normocephalic and atraumatic.  Eyes:     General: No scleral icterus. Cardiovascular:     Rate and Rhythm: Regular  rhythm. Tachycardia present.     Heart sounds: Normal heart sounds. No murmur heard.    No friction rub. No gallop.  Pulmonary:     Effort: Pulmonary effort is normal. No respiratory distress.     Breath sounds: Normal breath sounds. No stridor. No wheezing, rhonchi or rales.  Abdominal:     General: Bowel sounds are normal.     Palpations: Abdomen is soft. There is no mass.     Tenderness: There is abdominal tenderness. There is no guarding or rebound.     Hernia: No hernia is present.  Skin:    General: Skin is warm and dry.  Neurological:     Mental Status: She is alert and oriented to person, place, and time.     Primary care notes reviewed.  Ultrasound report reviewed. Assessment  Biliary colic, cholelithiasis Plan  Patient is scheduled for laparoscopic cholecystectomy on 11/13/2021.  The risks and benefits of the procedure including bleeding, infection, hepatobiliary injury, the possibility of an open procedure were fully explained to the patient, who gave informed consent.

## 2021-11-09 NOTE — Patient Instructions (Signed)
Nicole Santos  11/09/2021     '@PREFPERIOPPHARMACY'$ @   Your procedure is scheduled on  11/13/2021.   Report to [facility] at  [facility]   Call this number if you have problems the morning of surgery:  [phone]   Remember:  Do not eat or drink after midnight.    Your last dose of semaglutide should have been 11/05/2021 or before.     DO NOT take any medications for diabetes the morning of your procedure.     Take these medicines the morning of surgery with A SIP OF WATER                                         tagamet     Do not wear jewelry, make-up or nail polish.  Do not wear lotions, powders, or perfumes, or deodorant.  Do not shave 48 hours prior to surgery.  Men may shave face and neck.  Do not bring valuables to the hospital.  [facility] is not responsible for any belongings or valuables.  Contacts, dentures or bridgework may not be worn into surgery.  Leave your suitcase in the car.  After surgery it may be brought to your room.  For patients admitted to the hospital, discharge time will be determined by your treatment team.  Patients discharged the day of surgery will not be allowed to drive home and must have someone with them for 24 hours.    Special instructions:   DO NOT smoke tobacco or vape for 24 hours before your procedure.  Please read over the following fact sheets that you were given. Coughing and Deep Breathing, Surgical Site Infection Prevention, Anesthesia Post-op Instructions, and Care and Recovery After Surgery      Minimally Invasive Cholecystectomy, Care After The following information offers guidance on how to care for yourself after your procedure. Your health care provider may also give you more specific instructions. If you have problems or questions, contact your health care provider. What can I expect after the procedure? After the procedure, it is common to have: Pain at your incision sites. You will be given medicines  to control this pain. Mild nausea or vomiting. Bloating and possible shoulder pain from the gas that was used during the procedure. Follow these instructions at home: Medicines Take over-the-counter and prescription medicines only as told by your health care provider. If you were prescribed an antibiotic medicine, take it as told by your health care provider. Do not stop using the antibiotic even if you start to feel better. Ask your health care provider if the medicine prescribed to you: Requires you to avoid driving or using machinery. Can cause constipation. You may need to take these actions to prevent or treat constipation: Drink enough fluid to keep your urine pale yellow. Take over-the-counter or prescription medicines. Eat foods that are high in fiber, such as beans, whole grains, and fresh fruits and vegetables. Limit foods that are high in fat and processed sugars, such as fried or sweet foods. Incision care  Follow instructions from your health care provider about how to take care of your incisions. Make sure you: Wash your hands with soap and water for at least 20 seconds before and after you change your bandage (dressing). If soap and water are not available, use hand sanitizer. Change your dressing  as told by your health care provider. Leave stitches (sutures), skin glue, or adhesive strips in place. These skin closures may need to be in place for 2 weeks or longer. If adhesive strip edges start to loosen and curl up, you may trim the loose edges. Do not remove adhesive strips completely unless your health care provider tells you to do that. Do not take baths, swim, or use a hot tub until your health care provider approves. Ask your health care provider if you may take showers. You may only be allowed to take sponge baths. Check your incision area every day for signs of infection. Check for: More redness, swelling, or pain. Fluid or blood. Warmth. Pus or a bad  smell. Activity Rest as told by your health care provider. Do not do activities that require a lot of effort. Avoid sitting for a long time without moving. Get up to take short walks every 1-2 hours. This is important to improve blood flow and breathing. Ask for help if you feel weak or unsteady. Do not lift anything that is heavier than 10 lb (4.5 kg), or the limit that you are told, until your health care provider says that it is safe. Do not play contact sports until your health care provider approves. Do not return to work or school until your health care provider approves. Return to your normal activities as told by your health care provider. Ask your health care provider what activities are safe for you. General instructions If you were given a sedative during the procedure, it can affect you for several hours. Do not drive or operate machinery until your health care provider says that it is safe. Keep all follow-up visits. This is important. Contact a health care provider if: You develop a rash. You have more redness, swelling, or pain around your incisions. You have fluid or blood coming from your incisions. Your incisions feel warm to the touch. You have pus or a bad smell coming from your incisions. You have a fever. One or more of your incisions breaks open. Get help right away if: You have trouble breathing. You have chest pain. You have more pain in your shoulders. You faint or feel dizzy when you stand. You have severe pain in your abdomen. You have nausea or vomiting that lasts for more than one day. You have leg pain that is new or unusual, or if it is localized to one specific spot. These symptoms may represent a serious problem that is an emergency. Do not wait to see if the symptoms will go away. Get medical help right away. Call your local emergency services (911 in the [place] Do not drive yourself to the hospital. Summary After your procedure, it is common to have  pain at the incision sites. You may also have nausea or bloating. Follow your health care provider's instructions about medicine, activity restrictions, and caring for your incision areas. Do not do activities that require a lot of effort. Contact a health care provider if you have a fever or other signs of infection, such as more redness, swelling, or pain around the incisions. Get help right away if you have chest pain, increasing pain in the shoulders, or trouble breathing. This information is not intended to replace advice given to you by your health care provider. Make sure you discuss any questions you have with your health care provider. Document Revised: 11/22/2020 Document Reviewed: 11/22/2020 Elsevier Patient Education  [place] Anesthesia, Adult, Care After  This sheet gives you information about how to care for yourself after your procedure. Your health care provider may also give you more specific instructions. If you have problems or questions, contact your health care provider. What can I expect after the procedure? After the procedure, the following side effects are common: Pain or discomfort at the IV site. Nausea. Vomiting. Sore throat. Trouble concentrating. Feeling cold or chills. Feeling weak or tired. Sleepiness and fatigue. Soreness and body aches. These side effects can affect parts of the body that were not involved in surgery. Follow these instructions at home: For the time period you were told by your health care provider:  Rest. Do not participate in activities where you could fall or become injured. Do not drive or use machinery. Do not drink alcohol. Do not take sleeping pills or medicines that cause drowsiness. Do not make important decisions or sign legal documents. Do not take care of children on your own. Eating and drinking Follow any instructions from your health care provider about eating or drinking restrictions. When you feel  hungry, start by eating small amounts of foods that are soft and easy to digest (bland), such as toast. Gradually return to your regular diet. Drink enough fluid to keep your urine pale yellow. If you vomit, rehydrate by drinking water, juice, or clear broth. General instructions If you have sleep apnea, surgery and certain medicines can increase your risk for breathing problems. Follow instructions from your health care provider about wearing your sleep device: Anytime you are sleeping, including during daytime naps. While taking prescription pain medicines, sleeping medicines, or medicines that make you drowsy. Have a responsible adult stay with you for the time you are told. It is important to have someone help care for you until you are awake and alert. Return to your normal activities as told by your health care provider. Ask your health care provider what activities are safe for you. Take over-the-counter and prescription medicines only as told by your health care provider. If you smoke, do not smoke without supervision. Keep all follow-up visits as told by your health care provider. This is important. Contact a health care provider if: You have nausea or vomiting that does not get better with medicine. You cannot eat or drink without vomiting. You have pain that does not get better with medicine. You are unable to pass urine. You develop a skin rash. You have a fever. You have redness around your IV site that gets worse. Get help right away if: You have difficulty breathing. You have chest pain. You have blood in your urine or stool, or you vomit blood. Summary After the procedure, it is common to have a sore throat or nausea. It is also common to feel tired. Have a responsible adult stay with you for the time you are told. It is important to have someone help care for you until you are awake and alert. When you feel hungry, start by eating small amounts of foods that are soft and  easy to digest (bland), such as toast. Gradually return to your regular diet. Drink enough fluid to keep your urine pale yellow. Return to your normal activities as told by your health care provider. Ask your health care provider what activities are safe for you. This information is not intended to replace advice given to you by your health care provider. Make sure you discuss any questions you have with your health care provider. Document Revised: 02/04/2020 Document Reviewed: 09/03/2019 Elsevier Patient  Education  [place]. How to Use Chlorhexidine for Bathing Chlorhexidine gluconate (CHG) is a germ-killing (antiseptic) solution that is used to clean the skin. It can get rid of the bacteria that normally live on the skin and can keep them away for about 24 hours. To clean your skin with CHG, you may be given: A CHG solution to use in the shower or as part of a sponge bath. A prepackaged cloth that contains CHG. Cleaning your skin with CHG may help lower the risk for infection: While you are staying in the intensive care unit of the hospital. If you have a vascular access, such as a central line, to provide short-term or long-term access to your veins. If you have a catheter to drain urine from your bladder. If you are on a ventilator. A ventilator is a machine that helps you breathe by moving air in and out of your lungs. After surgery. What are the risks? Risks of using CHG include: A skin reaction. Hearing loss, if CHG gets in your ears and you have a perforated eardrum. Eye injury, if CHG gets in your eyes and is not rinsed out. The CHG product catching fire. Make sure that you avoid smoking and flames after applying CHG to your skin. Do not use CHG: If you have a chlorhexidine allergy or have previously reacted to chlorhexidine. On babies younger than 65 months of age. How to use CHG solution Use CHG only as told by your health care provider, and follow the instructions on  the label. Use the full amount of CHG as directed. Usually, this is one bottle. During a shower Follow these steps when using CHG solution during a shower (unless your health care provider gives you different instructions): Start the shower. Use your normal soap and shampoo to wash your face and hair. Turn off the shower or move out of the shower stream. Pour the CHG onto a clean washcloth. Do not use any type of brush or rough-edged sponge. Starting at your neck, lather your body down to your toes. Make sure you follow these instructions: If you will be having surgery, pay special attention to the part of your body where you will be having surgery. Scrub this area for at least 1 minute. Do not use CHG on your head or face. If the solution gets into your ears or eyes, rinse them well with water. Avoid your genital area. Avoid any areas of skin that have broken skin, cuts, or scrapes. Scrub your back and under your arms. Make sure to wash skin folds. Let the lather sit on your skin for 1-2 minutes or as long as told by your health care provider. Thoroughly rinse your entire body in the shower. Make sure that all body creases and crevices are rinsed well. Dry off with a clean towel. Do not put any substances on your body afterward--such as powder, lotion, or perfume--unless you are told to do so by your health care provider. Only use lotions that are recommended by the manufacturer. Put on clean clothes or pajamas. If it is the night before your surgery, sleep in clean sheets.  During a sponge bath Follow these steps when using CHG solution during a sponge bath (unless your health care provider gives you different instructions): Use your normal soap and shampoo to wash your face and hair. Pour the CHG onto a clean washcloth. Starting at your neck, lather your body down to your toes. Make sure you follow these instructions: If  you will be having surgery, pay special attention to the part of  your body where you will be having surgery. Scrub this area for at least 1 minute. Do not use CHG on your head or face. If the solution gets into your ears or eyes, rinse them well with water. Avoid your genital area. Avoid any areas of skin that have broken skin, cuts, or scrapes. Scrub your back and under your arms. Make sure to wash skin folds. Let the lather sit on your skin for 1-2 minutes or as long as told by your health care provider. Using a different clean, wet washcloth, thoroughly rinse your entire body. Make sure that all body creases and crevices are rinsed well. Dry off with a clean towel. Do not put any substances on your body afterward--such as powder, lotion, or perfume--unless you are told to do so by your health care provider. Only use lotions that are recommended by the manufacturer. Put on clean clothes or pajamas. If it is the night before your surgery, sleep in clean sheets. How to use CHG prepackaged cloths Only use CHG cloths as told by your health care provider, and follow the instructions on the label. Use the CHG cloth on clean, dry skin. Do not use the CHG cloth on your head or face unless your health care provider tells you to. When washing with the CHG cloth: Avoid your genital area. Avoid any areas of skin that have broken skin, cuts, or scrapes. Before surgery Follow these steps when using a CHG cloth to clean before surgery (unless your health care provider gives you different instructions): Using the CHG cloth, vigorously scrub the part of your body where you will be having surgery. Scrub using a back-and-forth motion for 3 minutes. The area on your body should be completely wet with CHG when you are done scrubbing. Do not rinse. Discard the cloth and let the area air-dry. Do not put any substances on the area afterward, such as powder, lotion, or perfume. Put on clean clothes or pajamas. If it is the night before your surgery, sleep in clean sheets.  For  general bathing Follow these steps when using CHG cloths for general bathing (unless your health care provider gives you different instructions). Use a separate CHG cloth for each area of your body. Make sure you wash between any folds of skin and between your fingers and toes. Wash your body in the following order, switching to a new cloth after each step: The front of your neck, shoulders, and chest. Both of your arms, under your arms, and your hands. Your stomach and groin area, avoiding the genitals. Your right leg and foot. Your left leg and foot. The back of your neck, your back, and your buttocks. Do not rinse. Discard the cloth and let the area air-dry. Do not put any substances on your body afterward--such as powder, lotion, or perfume--unless you are told to do so by your health care provider. Only use lotions that are recommended by the manufacturer. Put on clean clothes or pajamas. Contact a health care provider if: Your skin gets irritated after scrubbing. You have questions about using your solution or cloth. You swallow any chlorhexidine. Call your local poison control center (1-(218) 400-6017 in the [place] Get help right away if: Your eyes itch badly, or they become very red or swollen. Your skin itches badly and is red or swollen. Your hearing changes. You have trouble seeing. You have swelling or tingling in your mouth or  throat. You have trouble breathing. These symptoms may represent a serious problem that is an emergency. Do not wait to see if the symptoms will go away. Get medical help right away. Call your local emergency services (911 in the [place] Do not drive yourself to the hospital. Summary Chlorhexidine gluconate (CHG) is a germ-killing (antiseptic) solution that is used to clean the skin. Cleaning your skin with CHG may help to lower your risk for infection. You may be given CHG to use for bathing. It may be in a bottle or in a prepackaged cloth to use on your  skin. Carefully follow your health care provider's instructions and the instructions on the product label. Do not use CHG if you have a chlorhexidine allergy. Contact your health care provider if your skin gets irritated after scrubbing. This information is not intended to replace advice given to you by your health care provider. Make sure you discuss any questions you have with your health care provider. Document Revised: 08/01/2020 Document Reviewed: 08/01/2020 Elsevier Patient Education  [place].

## 2021-11-09 NOTE — H&P (Signed)
Nicole Santos; [id number]; 06-24-1970   HPI Patient is a 51 year old white female who was referred to my care by Hendricks Limes for evaluation treatment of biliary colic secondary to cholelithiasis.  Patient has had epigastric and right upper quadrant abdominal pain with radiation to her back, nausea, and an episode of vomiting since last month.  She denies any fever, chills, jaundice.  All foods seem to bother her at the present time.  She is on a clear liquid diet.  The episodes have increased in frequency and intensity.  She had an ultrasound of the gallbladder which revealed cholelithiasis with mildly thickened gallbladder wall and a normal common bile duct.  She did have a positive Murphy sign. Patient states she is chronically tachycardic. Past Medical History:  Diagnosis Date   Acute respiratory disease due to COVID-19 virus 02/02/2019   Cancer ([place])    Diabetes mellitus without complication ([place])    Hypertension     Past Surgical History:  Procedure Laterality Date   KNEE SURGERY Right    Arthroscopic   TONSILLECTOMY AND ADENOIDECTOMY      Family History  Problem Relation Age of Onset   Hypertension Mother    Breast cancer Maternal Great-grandmother     Current Outpatient Medications on File Prior to Visit  Medication Sig Dispense Refill   aspirin EC 81 MG tablet Take 81 mg by mouth daily.     atorvastatin (LIPITOR) 20 MG tablet Take 1 tablet (20 mg total) by mouth daily. 90 tablet 1   cimetidine (TAGAMET) 200 MG tablet Take 200 mg by mouth as needed.      ibuprofen (ADVIL) 200 MG tablet Take 200 mg by mouth every 6 (six) hours as needed for mild pain or moderate pain.     nitrofurantoin, macrocrystal-monohydrate, (MACROBID) 100 MG capsule Take 1 capsule (100 mg total) by mouth 2 (two) times daily. 1 po BId 14 capsule 0   Semaglutide, 2 MG/DOSE, 8 MG/3ML SOPN Inject 2 mg as directed once a week. 9 mL 1   lisinopril (ZESTRIL) 5 MG tablet Take 1 tablet (5 mg total) by mouth daily.  (Patient not taking: Reported on 11/09/2021) 30 tablet 2   No current facility-administered medications on file prior to visit.    Allergies  Allergen Reactions   Augmentin [Amoxicillin-Pot Clavulanate] Diarrhea and Nausea And Vomiting   Doxycycline     Abdominal cramps.   Topamax [Topiramate] Nausea And Vomiting    Social History   Substance and Sexual Activity  Alcohol Use No    Social History   Tobacco Use  Smoking Status Never  Smokeless Tobacco Never    Review of Systems  Constitutional:  Positive for malaise/fatigue.  HENT: Negative.    Eyes: Negative.   Respiratory: Negative.    Cardiovascular: Negative.   Gastrointestinal:  Positive for abdominal pain and heartburn.  Genitourinary:  Positive for frequency.  Musculoskeletal: Negative.   Skin: Negative.   Neurological: Negative.   Endo/Heme/Allergies: Negative.   Psychiatric/Behavioral: Negative.      Objective   Vitals:   11/09/21 1027  BP: (!) 138/99  Pulse: (!) 126  Resp: 12  Temp: 98.1 F (36.7 C)  SpO2: 93%    Physical Exam Vitals reviewed.  Constitutional:      Appearance: Normal appearance. She is obese. She is not ill-appearing.  HENT:     Head: Normocephalic and atraumatic.  Eyes:     General: No scleral icterus. Cardiovascular:     Rate and Rhythm: Regular  rhythm. Tachycardia present.     Heart sounds: Normal heart sounds. No murmur heard.    No friction rub. No gallop.  Pulmonary:     Effort: Pulmonary effort is normal. No respiratory distress.     Breath sounds: Normal breath sounds. No stridor. No wheezing, rhonchi or rales.  Abdominal:     General: Bowel sounds are normal.     Palpations: Abdomen is soft. There is no mass.     Tenderness: There is abdominal tenderness. There is no guarding or rebound.     Hernia: No hernia is present.  Skin:    General: Skin is warm and dry.  Neurological:     Mental Status: She is alert and oriented to person, place, and time.     Primary care notes reviewed.  Ultrasound report reviewed. Assessment  Biliary colic, cholelithiasis Plan  Patient is scheduled for laparoscopic cholecystectomy on 11/13/2021.  The risks and benefits of the procedure including bleeding, infection, hepatobiliary injury, the possibility of an open procedure were fully explained to the patient, who gave informed consent.

## 2021-11-10 ENCOUNTER — Encounter (HOSPITAL_COMMUNITY)
Admission: RE | Admit: 2021-11-10 | Discharge: 2021-11-10 | Disposition: A | Discharge status: Home / Self Care | Payer: Commercial Managed Care - PPO | Source: Ambulatory Visit | Attending: General Surgery | Admitting: General Surgery

## 2021-11-10 ENCOUNTER — Other Ambulatory Visit: Payer: Self-pay

## 2021-11-10 ENCOUNTER — Other Ambulatory Visit: Payer: Self-pay | Admitting: Family Medicine

## 2021-11-10 ENCOUNTER — Encounter (HOSPITAL_COMMUNITY): Payer: Self-pay

## 2021-11-10 VITALS — BP 132/46 | HR 137 | Temp 98.2°F | Resp 18 | Ht 62.0 in | Wt 238.0 lb

## 2021-11-10 DIAGNOSIS — E119 Type 2 diabetes mellitus without complications: Secondary | ICD-10-CM | POA: Insufficient documentation

## 2021-11-10 DIAGNOSIS — N289 Disorder of kidney and ureter, unspecified: Secondary | ICD-10-CM

## 2021-11-10 DIAGNOSIS — Z01818 Encounter for other preprocedural examination: Secondary | ICD-10-CM | POA: Insufficient documentation

## 2021-11-10 DIAGNOSIS — E1165 Type 2 diabetes mellitus with hyperglycemia: Secondary | ICD-10-CM

## 2021-11-10 DIAGNOSIS — I10 Essential (primary) hypertension: Secondary | ICD-10-CM | POA: Diagnosis not present

## 2021-11-10 HISTORY — DX: Tachycardia, unspecified: R00.0

## 2021-11-10 HISTORY — DX: Dyspnea, unspecified: R06.00

## 2021-11-10 HISTORY — DX: Depression, unspecified: F32.A

## 2021-11-10 HISTORY — DX: Cardiac arrhythmia, unspecified: I49.9

## 2021-11-10 HISTORY — DX: Sleep apnea, unspecified: G47.30

## 2021-11-10 HISTORY — DX: Anxiety disorder, unspecified: F41.9

## 2021-11-10 LAB — POCT PREGNANCY, URINE: Preg Test, Ur: NEGATIVE

## 2021-11-13 ENCOUNTER — Other Ambulatory Visit: Payer: Self-pay

## 2021-11-13 ENCOUNTER — Ambulatory Visit (HOSPITAL_BASED_OUTPATIENT_CLINIC_OR_DEPARTMENT_OTHER): Payer: Commercial Managed Care - PPO | Admitting: Anesthesiology

## 2021-11-13 ENCOUNTER — Observation Stay (HOSPITAL_COMMUNITY)
Admission: RE | Admit: 2021-11-13 | Discharge: 2021-11-15 | Disposition: A | Discharge status: Home / Self Care | Payer: Commercial Managed Care - PPO | Attending: General Surgery | Admitting: General Surgery

## 2021-11-13 ENCOUNTER — Observation Stay (HOSPITAL_COMMUNITY): Payer: Commercial Managed Care - PPO

## 2021-11-13 ENCOUNTER — Encounter (HOSPITAL_COMMUNITY)
Admission: RE | Disposition: A | Discharge status: Home / Self Care | Payer: Self-pay | Source: Home / Self Care | Attending: General Surgery

## 2021-11-13 ENCOUNTER — Ambulatory Visit (HOSPITAL_COMMUNITY): Payer: Commercial Managed Care - PPO | Admitting: Anesthesiology

## 2021-11-13 ENCOUNTER — Encounter (HOSPITAL_COMMUNITY): Payer: Self-pay | Admitting: General Surgery

## 2021-11-13 DIAGNOSIS — Z8616 Personal history of COVID-19: Secondary | ICD-10-CM | POA: Diagnosis not present

## 2021-11-13 DIAGNOSIS — K8020 Calculus of gallbladder without cholecystitis without obstruction: Principal | ICD-10-CM | POA: Insufficient documentation

## 2021-11-13 DIAGNOSIS — R188 Other ascites: Secondary | ICD-10-CM | POA: Diagnosis not present

## 2021-11-13 DIAGNOSIS — E119 Type 2 diabetes mellitus without complications: Secondary | ICD-10-CM

## 2021-11-13 DIAGNOSIS — Z01818 Encounter for other preprocedural examination: Secondary | ICD-10-CM

## 2021-11-13 DIAGNOSIS — C7960 Secondary malignant neoplasm of unspecified ovary: Secondary | ICD-10-CM | POA: Insufficient documentation

## 2021-11-13 DIAGNOSIS — R180 Malignant ascites: Secondary | ICD-10-CM | POA: Diagnosis not present

## 2021-11-13 DIAGNOSIS — Z7982 Long term (current) use of aspirin: Secondary | ICD-10-CM | POA: Insufficient documentation

## 2021-11-13 DIAGNOSIS — I10 Essential (primary) hypertension: Secondary | ICD-10-CM

## 2021-11-13 DIAGNOSIS — C7982 Secondary malignant neoplasm of genital organs: Secondary | ICD-10-CM | POA: Diagnosis not present

## 2021-11-13 DIAGNOSIS — Z7985 Long-term (current) use of injectable non-insulin antidiabetic drugs: Secondary | ICD-10-CM

## 2021-11-13 DIAGNOSIS — Z79899 Other long term (current) drug therapy: Secondary | ICD-10-CM | POA: Diagnosis not present

## 2021-11-13 DIAGNOSIS — Z7984 Long term (current) use of oral hypoglycemic drugs: Secondary | ICD-10-CM | POA: Insufficient documentation

## 2021-11-13 DIAGNOSIS — C801 Malignant (primary) neoplasm, unspecified: Secondary | ICD-10-CM | POA: Insufficient documentation

## 2021-11-13 DIAGNOSIS — K8070 Calculus of gallbladder and bile duct without cholecystitis without obstruction: Secondary | ICD-10-CM

## 2021-11-13 DIAGNOSIS — C569 Malignant neoplasm of unspecified ovary: Secondary | ICD-10-CM

## 2021-11-13 DIAGNOSIS — E1165 Type 2 diabetes mellitus with hyperglycemia: Secondary | ICD-10-CM

## 2021-11-13 HISTORY — PX: LAPAROSCOPIC ABDOMINAL EXPLORATION: SHX6249

## 2021-11-13 LAB — HEMOGLOBIN A1C
Hgb A1c MFr Bld: 5.4 % (ref 4.8–5.6)
Mean Plasma Glucose: 108.28 mg/dL

## 2021-11-13 LAB — GLUCOSE, CAPILLARY
Glucose-Capillary: 104 mg/dL — ABNORMAL HIGH (ref 70–99)
Glucose-Capillary: 116 mg/dL — ABNORMAL HIGH (ref 70–99)
Glucose-Capillary: 95 mg/dL (ref 70–99)
Glucose-Capillary: 135 mg/dL — ABNORMAL HIGH (ref 70–99)

## 2021-11-13 SURGERY — EXPLORATION, ABDOMEN, LAPAROSCOPIC
Anesthesia: General | Site: Abdomen

## 2021-11-13 MED ORDER — ONDANSETRON HCL 4 MG/2ML IJ SOLN
4.0000 mg | Freq: Four times a day (QID) | INTRAMUSCULAR | Status: DC | PRN
  Administered 2021-11-15: 4 mg via INTRAVENOUS
  Filled 2021-11-13: qty 2

## 2021-11-13 MED ORDER — IOHEXOL 300 MG/ML  SOLN
100.0000 mL | Freq: Once | INTRAMUSCULAR | Status: AC | PRN
  Administered 2021-11-13: 100 mL via INTRAVENOUS

## 2021-11-13 MED ORDER — MIDAZOLAM HCL 2 MG/2ML IJ SOLN
INTRAMUSCULAR | Status: AC
  Filled 2021-11-13: qty 2

## 2021-11-13 MED ORDER — BUPIVACAINE LIPOSOME 1.3 % IJ SUSP
INTRAMUSCULAR | Status: DC | PRN
  Administered 2021-11-13: 20 mL

## 2021-11-13 MED ORDER — PHENYLEPHRINE 80 MCG/ML (10ML) SYRINGE FOR IV PUSH (FOR BLOOD PRESSURE SUPPORT)
PREFILLED_SYRINGE | INTRAVENOUS | Status: DC | PRN
  Administered 2021-11-13: 160 ug via INTRAVENOUS
  Administered 2021-11-13: 80 ug via INTRAVENOUS

## 2021-11-13 MED ORDER — SCOPOLAMINE 1 MG/3DAYS TD PT72
1.0000 | MEDICATED_PATCH | Freq: Once | TRANSDERMAL | Status: DC
  Administered 2021-11-13: 1.5 mg via TRANSDERMAL

## 2021-11-13 MED ORDER — CHLORHEXIDINE GLUCONATE 0.12 % MT SOLN
15.0000 mL | Freq: Once | OROMUCOSAL | Status: AC
  Administered 2021-11-13: 15 mL via OROMUCOSAL

## 2021-11-13 MED ORDER — FENTANYL CITRATE PF 50 MCG/ML IJ SOSY
25.0000 ug | PREFILLED_SYRINGE | INTRAMUSCULAR | Status: DC | PRN
  Administered 2021-11-13 (×2): 50 ug via INTRAVENOUS
  Filled 2021-11-13 (×2): qty 1

## 2021-11-13 MED ORDER — CHLORHEXIDINE GLUCONATE CLOTH 2 % EX PADS: 6.0000 | MEDICATED_PAD | Freq: Once | CUTANEOUS | Status: DC

## 2021-11-13 MED ORDER — OXYCODONE HCL 5 MG PO TABS
5.0000 mg | ORAL_TABLET | ORAL | Status: DC | PRN
  Administered 2021-11-15: 10 mg via ORAL
  Filled 2021-11-13: qty 2

## 2021-11-13 MED ORDER — FENTANYL CITRATE (PF) 250 MCG/5ML IJ SOLN
INTRAMUSCULAR | Status: DC | PRN
  Administered 2021-11-13: 50 ug via INTRAVENOUS

## 2021-11-13 MED ORDER — HYDROMORPHONE HCL 1 MG/ML IJ SOLN: 1.0000 mg | INTRAMUSCULAR | Status: DC | PRN

## 2021-11-13 MED ORDER — PANTOPRAZOLE SODIUM 40 MG PO TBEC
40.0000 mg | DELAYED_RELEASE_TABLET | Freq: Every day | ORAL | Status: DC
  Administered 2021-11-14: 40 mg via ORAL
  Filled 2021-11-13: qty 1

## 2021-11-13 MED ORDER — LACTATED RINGERS IV SOLN: INTRAVENOUS | Status: DC

## 2021-11-13 MED ORDER — ENOXAPARIN SODIUM 40 MG/0.4ML IJ SOSY
40.0000 mg | PREFILLED_SYRINGE | INTRAMUSCULAR | Status: DC
  Administered 2021-11-14: 40 mg via SUBCUTANEOUS
  Filled 2021-11-13 (×2): qty 0.4

## 2021-11-13 MED ORDER — ACETAMINOPHEN 650 MG RE SUPP: 650.0000 mg | Freq: Four times a day (QID) | RECTAL | Status: DC | PRN

## 2021-11-13 MED ORDER — ONDANSETRON HCL 4 MG/2ML IJ SOLN
INTRAMUSCULAR | Status: AC
  Filled 2021-11-13: qty 2

## 2021-11-13 MED ORDER — ENSURE ENLIVE PO LIQD: 237.0000 mL | Freq: Two times a day (BID) | ORAL | Status: DC

## 2021-11-13 MED ORDER — DEXAMETHASONE SODIUM PHOSPHATE 10 MG/ML IJ SOLN
INTRAMUSCULAR | Status: DC | PRN
  Administered 2021-11-13: 4 mg via INTRAVENOUS

## 2021-11-13 MED ORDER — ONDANSETRON 4 MG PO TBDP: 4.0000 mg | ORAL_TABLET | Freq: Four times a day (QID) | ORAL | Status: DC | PRN

## 2021-11-13 MED ORDER — LISINOPRIL 5 MG PO TABS
5.0000 mg | ORAL_TABLET | Freq: Every day | ORAL | Status: DC
  Filled 2021-11-13 (×2): qty 1

## 2021-11-13 MED ORDER — ORAL CARE MOUTH RINSE: 15.0000 mL | Freq: Once | OROMUCOSAL | Status: AC

## 2021-11-13 MED ORDER — ONDANSETRON HCL 4 MG/2ML IJ SOLN
INTRAMUSCULAR | Status: DC | PRN
  Administered 2021-11-13: 4 mg via INTRAVENOUS

## 2021-11-13 MED ORDER — ACETAMINOPHEN 325 MG PO TABS: 650.0000 mg | ORAL_TABLET | Freq: Four times a day (QID) | ORAL | Status: DC | PRN

## 2021-11-13 MED ORDER — SODIUM CHLORIDE 0.9 % IR SOLN
Status: DC | PRN
  Administered 2021-11-13: 1000 mL

## 2021-11-13 MED ORDER — SODIUM CHLORIDE 0.9 % IV SOLN: Freq: Once | INTRAVENOUS | Status: AC

## 2021-11-13 MED ORDER — DEXAMETHASONE SODIUM PHOSPHATE 10 MG/ML IJ SOLN
INTRAMUSCULAR | Status: AC
  Filled 2021-11-13: qty 1

## 2021-11-13 MED ORDER — DIPHENHYDRAMINE HCL 50 MG/ML IJ SOLN: 25.0000 mg | Freq: Four times a day (QID) | INTRAMUSCULAR | Status: DC | PRN

## 2021-11-13 MED ORDER — DIPHENHYDRAMINE HCL 25 MG PO CAPS: 25.0000 mg | ORAL_CAPSULE | Freq: Four times a day (QID) | ORAL | Status: DC | PRN

## 2021-11-13 MED ORDER — SODIUM CHLORIDE 0.9 % IV SOLN: INTRAVENOUS | Status: DC

## 2021-11-13 MED ORDER — MIDAZOLAM HCL 5 MG/5ML IJ SOLN
INTRAMUSCULAR | Status: DC | PRN
  Administered 2021-11-13: 1 mg via INTRAVENOUS

## 2021-11-13 MED ORDER — SCOPOLAMINE 1 MG/3DAYS TD PT72
MEDICATED_PATCH | TRANSDERMAL | Status: AC
  Filled 2021-11-13: qty 1

## 2021-11-13 MED ORDER — CIPROFLOXACIN IN D5W 400 MG/200ML IV SOLN
INTRAVENOUS | Status: AC
  Filled 2021-11-13: qty 200

## 2021-11-13 MED ORDER — OXYCODONE HCL 5 MG PO TABS: 5.0000 mg | ORAL_TABLET | Freq: Once | ORAL | Status: DC | PRN

## 2021-11-13 MED ORDER — FENTANYL CITRATE (PF) 100 MCG/2ML IJ SOLN
INTRAMUSCULAR | Status: AC
  Filled 2021-11-13: qty 2

## 2021-11-13 MED ORDER — CIPROFLOXACIN IN D5W 400 MG/200ML IV SOLN
400.0000 mg | INTRAVENOUS | Status: AC
  Administered 2021-11-13: 400 mg via INTRAVENOUS

## 2021-11-13 MED ORDER — LIDOCAINE 2% (20 MG/ML) 5 ML SYRINGE
INTRAMUSCULAR | Status: DC | PRN
  Administered 2021-11-13: 100 mg via INTRAVENOUS

## 2021-11-13 MED ORDER — PROPOFOL 10 MG/ML IV BOLUS
INTRAVENOUS | Status: DC | PRN
  Administered 2021-11-13: 150 mg via INTRAVENOUS

## 2021-11-13 MED ORDER — ROCURONIUM BROMIDE 10 MG/ML (PF) SYRINGE
PREFILLED_SYRINGE | INTRAVENOUS | Status: DC | PRN
  Administered 2021-11-13: 100 mg via INTRAVENOUS

## 2021-11-13 MED ORDER — ESMOLOL HCL 100 MG/10ML IV SOLN
INTRAVENOUS | Status: DC | PRN
  Administered 2021-11-13: 20 mg via INTRAVENOUS

## 2021-11-13 MED ORDER — ONDANSETRON HCL 4 MG/2ML IJ SOLN: 4.0000 mg | Freq: Once | INTRAMUSCULAR | Status: DC | PRN

## 2021-11-13 MED ORDER — KETOROLAC TROMETHAMINE 30 MG/ML IJ SOLN
INTRAMUSCULAR | Status: AC
  Filled 2021-11-13: qty 1

## 2021-11-13 MED ORDER — KETOROLAC TROMETHAMINE 30 MG/ML IJ SOLN
30.0000 mg | Freq: Once | INTRAMUSCULAR | Status: AC
  Administered 2021-11-13: 30 mg via INTRAVENOUS

## 2021-11-13 MED ORDER — INSULIN ASPART 100 UNIT/ML IJ SOLN: 0.0000 [IU] | Freq: Three times a day (TID) | INTRAMUSCULAR | Status: DC

## 2021-11-13 MED ORDER — ATORVASTATIN CALCIUM 20 MG PO TABS
20.0000 mg | ORAL_TABLET | Freq: Every day | ORAL | Status: DC
  Administered 2021-11-13 – 2021-11-14 (×2): 20 mg via ORAL
  Filled 2021-11-13 (×2): qty 1

## 2021-11-13 MED ORDER — LORAZEPAM 2 MG/ML IJ SOLN: 1.0000 mg | INTRAMUSCULAR | Status: DC | PRN

## 2021-11-13 MED ORDER — ROCURONIUM BROMIDE 10 MG/ML (PF) SYRINGE
PREFILLED_SYRINGE | INTRAVENOUS | Status: AC
  Filled 2021-11-13: qty 10

## 2021-11-13 MED ORDER — OXYCODONE HCL 5 MG/5ML PO SOLN: 5.0000 mg | Freq: Once | ORAL | Status: DC | PRN

## 2021-11-13 MED ORDER — SUGAMMADEX SODIUM 200 MG/2ML IV SOLN
INTRAVENOUS | Status: DC | PRN
  Administered 2021-11-13: 500 mg via INTRAVENOUS

## 2021-11-13 SURGICAL SUPPLY — 45 items
ADH SKN CLS APL DERMABOND .7 (GAUZE/BANDAGES/DRESSINGS) ×2
APL PRP STRL LF DISP 70% ISPRP (MISCELLANEOUS) ×2
APPLIER CLIP ROT 10 11.4 M/L (STAPLE) ×3
APR CLP MED LRG 11.4X10 (STAPLE) ×2
BAG RETRIEVAL 10 (BASKET) ×1
CHLORAPREP W/TINT 26 (MISCELLANEOUS) ×3 IMPLANT
CLIP APPLIE ROT 10 11.4 M/L (STAPLE) ×2 IMPLANT
CLOTH BEACON ORANGE TIMEOUT ST (SAFETY) ×3 IMPLANT
COVER LIGHT HANDLE STERIS (MISCELLANEOUS) ×6 IMPLANT
DERMABOND ADVANCED (GAUZE/BANDAGES/DRESSINGS) ×1
DERMABOND ADVANCED .7 DNX12 (GAUZE/BANDAGES/DRESSINGS) ×2 IMPLANT
ELECT REM PT RETURN 9FT ADLT (ELECTROSURGICAL) ×3
ELECTRODE REM PT RTRN 9FT ADLT (ELECTROSURGICAL) ×2 IMPLANT
GAUZE 4X4 16PLY ~~LOC~~+RFID DBL (SPONGE) IMPLANT
GLOVE BIOGEL PI IND STRL 7.0 (GLOVE) ×4 IMPLANT
GLOVE BIOGEL PI INDICATOR 7.0 (GLOVE) ×2
GLOVE SURG SS PI 7.5 STRL IVOR (GLOVE) ×3 IMPLANT
GOWN STRL REUS W/TWL LRG LVL3 (GOWN DISPOSABLE) ×9 IMPLANT
HEMOSTAT SNOW SURGICEL 2X4 (HEMOSTASIS) ×3 IMPLANT
INST SET LAPROSCOPIC AP (KITS) ×3 IMPLANT
KIT TURNOVER KIT A (KITS) ×3 IMPLANT
LIGASURE LAP ATLAS 10MM 37CM (INSTRUMENTS) ×2 IMPLANT
MANIFOLD NEPTUNE II (INSTRUMENTS) ×3 IMPLANT
NDL HYPO 18GX1.5 BLUNT FILL (NEEDLE) ×1 IMPLANT
NDL HYPO 21X1.5 SAFETY (NEEDLE) ×1 IMPLANT
NDL INSUFFLATION 14GA 120MM (NEEDLE) ×1 IMPLANT
NEEDLE HYPO 18GX1.5 BLUNT FILL (NEEDLE) ×3 IMPLANT
NEEDLE HYPO 21X1.5 SAFETY (NEEDLE) ×3 IMPLANT
NEEDLE INSUFFLATION 14GA 120MM (NEEDLE) ×3 IMPLANT
NS IRRIG 1000ML POUR BTL (IV SOLUTION) ×3 IMPLANT
PACK LAP CHOLE LZT030E (CUSTOM PROCEDURE TRAY) ×3 IMPLANT
PAD ARMBOARD 7.5X6 YLW CONV (MISCELLANEOUS) ×3 IMPLANT
SET BASIN LINEN APH (SET/KITS/TRAYS/PACK) ×3 IMPLANT
SET TUBE SMOKE EVAC HIGH FLOW (TUBING) ×3 IMPLANT
SLEEVE Z-THREAD 5X100MM (TROCAR) ×3 IMPLANT
SUT MNCRL AB 4-0 PS2 18 (SUTURE) ×6 IMPLANT
SUT VICRYL 0 UR6 27IN ABS (SUTURE) ×3 IMPLANT
SYR 20ML LL LF (SYRINGE) ×6 IMPLANT
SYS BAG RETRIEVAL 10MM (BASKET) ×2
SYSTEM BAG RETRIEVAL 10MM (BASKET) ×2 IMPLANT
TROCAR Z-THRD FIOS HNDL 11X100 (TROCAR) ×3 IMPLANT
TROCAR Z-THREAD FIOS 5X100MM (TROCAR) ×3 IMPLANT
TROCAR Z-THREAD SLEEVE 11X100 (TROCAR) ×3 IMPLANT
TUBE CONNECTING 12X1/4 (SUCTIONS) ×3 IMPLANT
WARMER LAPAROSCOPE (MISCELLANEOUS) ×3 IMPLANT

## 2021-11-13 NOTE — Transfer of Care (Signed)
Immediate Anesthesia Transfer of Care Note  Patient: Nicole Santos  Procedure(s) Performed: Exploratory laporoscopy (Abdomen)  Patient Location: PACU  Anesthesia Type:General  Level of Consciousness: awake, alert  and oriented  Airway & Oxygen Therapy: Patient Spontanous Breathing  Post-op Assessment: Report given to RN, Post -op Vital signs reviewed and stable and Patient moving all extremities X 4  Post vital signs: Reviewed and stable  Last Vitals:  Vitals Value Taken Time  BP 105/69 11/13/21 1220  Temp    Pulse 108 11/13/21 1223  Resp 21 11/13/21 1223  SpO2 100 % 11/13/21 1223  Vitals shown include unvalidated device data.  Last Pain:  Vitals:   11/13/21 1005  TempSrc: Oral  PainSc: 0-No pain         Complications: No notable events documented.

## 2021-11-13 NOTE — Anesthesia Procedure Notes (Signed)
Procedure Name: Intubation Date/Time: 11/13/2021 11:10 AM  Performed by: Myna Bright, CRNAPre-anesthesia Checklist: Patient identified, Emergency Drugs available, Suction available and Patient being monitored Patient Re-evaluated:Patient Re-evaluated prior to induction Oxygen Delivery Method: Circle System Utilized Preoxygenation: Pre-oxygenation with 100% oxygen Induction Type: IV induction Ventilation: Mask ventilation without difficulty and Oral airway inserted - appropriate to patient size Grade View: Grade I Tube type: Oral Tube size: 7.5 mm Number of attempts: 2 Airway Equipment and Method: Stylet and Oral airway Placement Confirmation: ETT inserted through vocal cords under direct vision, positive ETCO2 and breath sounds checked- equal and bilateral Secured at: 23 cm Tube secured with: Tape Dental Injury: Teeth and Oropharynx as per pre-operative assessment

## 2021-11-13 NOTE — Interval H&P Note (Signed)
History and Physical Interval Note:  11/13/2021 10:43 AM  Nicole Santos  has presented today for surgery, with the diagnosis of CHOLELITHIASIS.  The various methods of treatment have been discussed with the patient and family. After consideration of risks, benefits and other options for treatment, the patient has consented to  Procedure(s): LAPAROSCOPIC CHOLECYSTECTOMY (N/A) as a surgical intervention.  The patient's history has been reviewed, patient examined, no change in status, stable for surgery.  I have reviewed the patient's chart and labs.  Questions were answered to the patient's satisfaction.     Aviva Signs

## 2021-11-13 NOTE — Progress Notes (Signed)
Pt down to radiology department for CT abd via [place] by staff.

## 2021-11-13 NOTE — Op Note (Addendum)
Patient:  Nicole Santos  DOB:  1970/08/09  MRN:  [id number]   Preop Diagnosis:  Cholelithiasis, biliary   Postop Diagnosis:  Bloody ascites    Procedure:  Exploratory laparoscopy  Surgeon: Aviva Signs, MD  Anes: General endotracheal  Indications: Patient is a 51 year old white female who presents for laparoscopic cholecystectomy due to biliary colic secondary to cholelithiasis.  The risks and benefits of the procedure including bleeding, infection, hepatobiliary injury, and the possibility of an open procedure were fully explained to the patient, who gave informed consent.  Procedure note: The patient was placed in the supine position.  After induction of general endotracheal anesthesia, the abdomen was prepped and draped using the usual sterile technique with ChloraPrep.  Surgical site confirmation was performed.  A supraumbilical incision was made down to the fascia.  A Veress needle was introduced into the abdominal cavity and confirmation of placement was done using the saline drop test.  The abdomen was then insufflated to 15 mmHg pressure.  An 11 mm trocar was introduced into the abdominal cavity under direct visualization without difficulty.  The patient was placed in reverse Trendelenburg position and an additional 11 mm trocar was placed in the epigastric region and 5 mm trocars were placed in the right upper quadrant and right flank regions.  The patient was noted to have a significant amount of bloody ascites.  There was also studding of the omentum as well as the upper abdominal wall and diaphragm.  The liver was within normal limits.  The gallbladder was within normal limits.  Given these findings, I am very concerned that she has malignant ascites due to an unknown primary.  6 L of ascites was evacuated.  Peritoneal fluid was sent off to pathology for cell cytology.  A piece of omentum was also sent off for pathology.  One of the peritoneal implants was sent off to pathology.  There  were peritoneal implants throughout a significant portion of the omentum and on the small intestine.  I was not able to fully evaluate the pelvis given the omental caking.  Given these findings, cholecystectomy is contraindicated.  All trocars were removed once air was evacuated out of the abdominal cavity.  The supraumbilical fascia was reapproximated using an 0 Vicryl interrupted suture.  Exparel was instilled into all wounds.  The skin was closed using a 4-0 Monocryl subcuticular suture.  Dermabond was applied.  All tape and needle counts were correct at the end of the procedure.  The patient was extubated in the operating room and transferred to PACU in stable condition.  The patient will be admitted for further work-up including CT scan of the chest, abdomen, pelvis, CEA, and AFP, CA125, CA 19-9.  Complications: None  EBL: Minimal  Specimen: Peritoneal fluid for cell cytology, omentum, peritoneal implant    Media Information   Document Information  Photos    11/13/2021 12:45  Attached To:  Hospital Encounter on 11/13/21   Source Information  Aviva Signs, MD  [facility]

## 2021-11-13 NOTE — Anesthesia Postprocedure Evaluation (Signed)
Anesthesia Post Note  Patient: Nicole Santos  Procedure(s) Performed: Exploratory laparoscopy (Abdomen)  Patient location during evaluation: Phase II Anesthesia Type: General Level of consciousness: awake Pain management: pain level controlled Vital Signs Assessment: post-procedure vital signs reviewed and stable Respiratory status: spontaneous breathing and respiratory function stable Cardiovascular status: blood pressure returned to baseline and stable Postop Assessment: no headache and no apparent nausea or vomiting Anesthetic complications: no Comments: Late entry   No notable events documented.   Last Vitals:  Vitals:   11/13/21 1353 11/13/21 1414  BP: 118/74 118/74  Pulse: 95 95  Resp: 16 16  Temp: 36.6 C 36.6 C  SpO2: 97%     Last Pain:  Vitals:   11/13/21 1414  TempSrc: Oral  PainSc:                  Louann Sjogren

## 2021-11-13 NOTE — Anesthesia Preprocedure Evaluation (Signed)
Anesthesia Evaluation  Patient identified by MRN, date of birth, ID band Patient awake    Reviewed: Allergy & Precautions, H&P , NPO status , Patient's Chart, lab work & pertinent test results, reviewed documented beta blocker date and time   Airway Mallampati: II  TM Distance: >3 FB Neck ROM: full    Dental no notable dental hx.    Pulmonary neg pulmonary ROS,    Pulmonary exam normal breath sounds clear to auscultation       Cardiovascular Exercise Tolerance: Good hypertension, + dysrhythmias  Rhythm:regular Rate:Tachycardia     Neuro/Psych PSYCHIATRIC DISORDERS Anxiety Depression  Neuromuscular disease    GI/Hepatic negative GI ROS, Neg liver ROS,   Endo/Other  diabetesMorbid obesity  Renal/GU negative Renal ROS  negative genitourinary   Musculoskeletal   Abdominal   Peds  Hematology negative hematology ROS (+)   Anesthesia Other Findings Chronic tachycardia  Reproductive/Obstetrics negative OB ROS                             Anesthesia Physical Anesthesia Plan  ASA: 3  Anesthesia Plan: General and General LMA   Post-op Pain Management:    Induction:   PONV Risk Score and Plan: Ondansetron and Scopolamine patch - Pre-op  Airway Management Planned:   Additional Equipment:   Intra-op Plan:   Post-operative Plan:   Informed Consent: I have reviewed the patients History and Physical, chart, labs and discussed the procedure including the risks, benefits and alternatives for the proposed anesthesia with the patient or authorized representative who has indicated his/her understanding and acceptance.     Dental Advisory Given  Plan Discussed with: CRNA  Anesthesia Plan Comments:         Anesthesia Quick Evaluation

## 2021-11-13 NOTE — Progress Notes (Signed)
Pt assisted up to bathroom, voided clear yellow urine without difficulty. Pt ambulated with only standby assistance. Pt now sitting up in recliner, good cough effort, properly splinting abd for pain relief with cough, good effort with IS. Sipping on ice water, denies any c/o n/v, denies need for pain medication at this time. Family at bedside.

## 2021-11-14 ENCOUNTER — Observation Stay (HOSPITAL_COMMUNITY): Payer: Commercial Managed Care - PPO

## 2021-11-14 ENCOUNTER — Encounter (HOSPITAL_COMMUNITY): Payer: Self-pay | Admitting: General Surgery

## 2021-11-14 DIAGNOSIS — C569 Malignant neoplasm of unspecified ovary: Secondary | ICD-10-CM

## 2021-11-14 DIAGNOSIS — K8020 Calculus of gallbladder without cholecystitis without obstruction: Secondary | ICD-10-CM | POA: Diagnosis not present

## 2021-11-14 HISTORY — DX: Malignant neoplasm of unspecified ovary: C56.9

## 2021-11-14 LAB — GLUCOSE, CAPILLARY
Glucose-Capillary: 94 mg/dL (ref 70–99)
Glucose-Capillary: 110 mg/dL — ABNORMAL HIGH (ref 70–99)

## 2021-11-14 LAB — CBC
HCT: 36.1 % (ref 36.0–46.0)
Hemoglobin: 11.3 g/dL — ABNORMAL LOW (ref 12.0–15.0)
MCH: 27.8 pg (ref 26.0–34.0)
MCHC: 31.3 g/dL (ref 30.0–36.0)
MCV: 88.9 fL (ref 80.0–100.0)
Platelets: 535 10*3/uL — ABNORMAL HIGH (ref 150–400)
RBC: 4.06 MIL/uL (ref 3.87–5.11)
RDW: 13.4 % (ref 11.5–15.5)
WBC: 10.2 10*3/uL (ref 4.0–10.5)
nRBC: 0 % (ref 0.0–0.2)

## 2021-11-14 LAB — COMPREHENSIVE METABOLIC PANEL
ALT: 15 U/L (ref 0–44)
AST: 19 U/L (ref 15–41)
Albumin: 2 g/dL — ABNORMAL LOW (ref 3.5–5.0)
Alkaline Phosphatase: 30 U/L — ABNORMAL LOW (ref 38–126)
Anion gap: 5 (ref 5–15)
BUN: 12 mg/dL (ref 6–20)
CO2: 24 mmol/L (ref 22–32)
Calcium: 7.5 mg/dL — ABNORMAL LOW (ref 8.9–10.3)
Chloride: 106 mmol/L (ref 98–111)
Creatinine, Ser: 0.63 mg/dL (ref 0.44–1.00)
GFR, Estimated: >60 mL/min (ref >60)
Glucose, Bld: 99 mg/dL (ref 70–99)
Potassium: 4.5 mmol/L (ref 3.5–5.1)
Sodium: 135 mmol/L (ref 135–145)
Total Bilirubin: 0.6 mg/dL (ref 0.3–1.2)
Total Protein: 4.9 g/dL — ABNORMAL LOW (ref 6.5–8.1)

## 2021-11-14 LAB — CYTOLOGY - NON PAP

## 2021-11-14 LAB — LIPASE, BLOOD: Lipase: 28 U/L (ref 11–51)

## 2021-11-14 LAB — AFP TUMOR MARKER: AFP, Serum, Tumor Marker: <1.8 ng/mL (ref 0.0–9.2)

## 2021-11-14 LAB — CANCER ANTIGEN 19-9: CA 19-9: 6 U/mL (ref 0–35)

## 2021-11-14 LAB — CEA: CEA: 1.1 ng/mL (ref 0.0–4.7)

## 2021-11-14 LAB — CA 125: Cancer Antigen (CA) 125: 1269 U/mL — ABNORMAL HIGH (ref 0.0–38.1)

## 2021-11-14 MED ORDER — CHLORHEXIDINE GLUCONATE CLOTH 2 % EX PADS
6.0000 | MEDICATED_PAD | Freq: Once | CUTANEOUS | Status: AC
  Administered 2021-11-14: 6 via TOPICAL

## 2021-11-14 MED ORDER — VANCOMYCIN HCL 1500 MG/300ML IV SOLN
1500.0000 mg | INTRAVENOUS | Status: AC
  Administered 2021-11-15: 1500 mg via INTRAVENOUS
  Filled 2021-11-14: qty 300

## 2021-11-14 NOTE — H&P (View-Only) (Signed)
1 Day Post-Op  Subjective: Patient denies any significant incisional pain.  Her breathing is much better.  Objective: Vital signs in last 24 hours: Temp:  [96.2 F (35.7 C)-98.1 F (36.7 C)] 97.7 F (36.5 C) (06/13 0428) Pulse Rate:  [95-114] 105 (06/13 0946) Resp:  [16-20] 16 (06/13 0946) BP: (93-118)/(49-76) 97/54 (06/13 0946) SpO2:  [91 %-98 %] 98 % (06/13 0946) Weight:  [105 kg] 105 kg (06/12 1556) Last BM Date : 11/14/21  Intake/Output from previous day: 06/12 0701 - 06/13 0700 In: 1477.7 [P.O.:120; I.V.:1057.7; IV Piggyback:300] Out: -  Intake/Output this shift: No intake/output data recorded.  General appearance: alert, cooperative, and no distress Resp: clear to auscultation bilaterally Cardio: regular rate and rhythm, S1, S2 normal, no murmur, click, rub or gallop GI: Soft, incisions healing well.  Lab Results:  Recent Labs    11/14/21 0446  WBC 10.2  HGB 11.3*  HCT 36.1  PLT 535*   BMET Recent Labs    11/14/21 0446  NA 135  K 4.5  CL 106  CO2 24  GLUCOSE 99  BUN 12  CREATININE 0.63  CALCIUM 7.5*   PT/INR No results for input(s): "LABPROT", "INR" in the last 72 hours.  Studies/Results: US PELVIS (TRANSABDOMINAL ONLY)  Result Date: 11/14/2021 CLINICAL DATA:  Malignant ascites. EXAM: TRANSABDOMINAL ULTRASOUND OF PELVIS TECHNIQUE: Transabdominal ultrasound examination of the pelvis was performed including evaluation of the uterus, ovaries, adnexal regions, and pelvic cul-de-sac. Patient declined transvaginal imaging. COMPARISON:  CT scan of November 13, 2021. FINDINGS: Uterus Measurements: 4.2 x 3.5 x 3.0 cm = volume: 24 mL. No fibroids or other mass visualized. Endometrium Thickness: 8 mm which is within normal limits. No focal abnormality visualized. Right ovary Not visualized due to body habitus and overlying bowel gas. Left ovary Not visualized due to body habitus and overlying bowel gas. Other findings:  Small amount of ascites is noted in the pelvis.  IMPRESSION: Uterus is grossly unremarkable. Ovaries are not visualized due to body habitus and overlying bowel gas. Small amount of ascites is noted. Electronically Signed   By: Marijo Conception M.D.   On: 11/14/2021 10:34   CT ABDOMEN PELVIS W WO CONTRAST  Result Date: 11/13/2021 CLINICAL DATA:  Peritoneal and omental metastatic disease and malignant ascites identified at laparotomy for cholecystectomy * Tracking Code: BO * EXAM: CT CHEST WITH CONTRAST CT ABDOMEN AND PELVIS WITH AND WITHOUT CONTRAST TECHNIQUE: Multidetector CT imaging of the chest was performed during intravenous contrast administration. Multidetector CT imaging of the abdomen and pelvis was performed following the standard protocol before and during bolus administration of intravenous contrast. RADIATION DOSE REDUCTION: This exam was performed according to the departmental dose-optimization program which includes automated exposure control, adjustment of the mA and/or kV according to patient size and/or use of iterative reconstruction technique. CONTRAST:  196m OMNIPAQUE IOHEXOL 300 MG/ML  SOLN COMPARISON:  None Available. FINDINGS: CT CHEST FINDINGS Cardiovascular: Scattered aortic atherosclerosis. Normal heart size. No pericardial effusion. Mediastinum/Nodes: No enlarged mediastinal, hilar, or axillary lymph nodes. Thyroid gland, trachea, and esophagus demonstrate no significant findings. Lungs/Pleura: Small left pleural effusion associated atelectasis or consolidation. Scattered areas of alveolar ground-glass airspace opacity throughout the lungs (series 14, image 50). No pleural effusion or pneumothorax. Musculoskeletal: No chest wall mass or suspicious osseous lesions identified. Chronic, callused fractures of the posterior left ribs. CT ABDOMEN PELVIS FINDINGS Hepatobiliary: No solid liver abnormality is seen. Multiple gallstones. No gallbladder wall thickening, or biliary dilatation. Pancreas: Unremarkable. No pancreatic ductal  dilatation or surrounding inflammatory changes. Spleen: Normal in size without significant abnormality. Adrenals/Urinary Tract: Adrenal glands are unremarkable. Small nonobstructive calculus of the inferior pole of the right kidney (series 5, image 62). Left kidney is normal. No mass or suspicious contrast enhancement. Bladder is unremarkable. Stomach/Bowel: Stomach is within normal limits. Appendix is not clearly visualized. No evidence of bowel wall thickening, distention, or inflammatory changes. Colonic diverticulosis. Vascular/Lymphatic: Aortic atherosclerosis. Left common iliac vein stent. Enlarged left and right cardiophrenic angle lymph nodes, measuring up to 1.5 x 1.1 cm (series 12, image 67). Reproductive: No mass or other abnormality. Other: Right upper quadrant and umbilical laparoscopy ports with subcutaneous fat stranding and small volume of subcutaneous air and fluid. Trace ascites throughout the abdomen and pelvis, with extensive peritoneal thickening, nodularity, and omental caking (series 12, image 41). Minimal postoperative pneumoperitoneum. Musculoskeletal: No acute osseous findings. IMPRESSION: 1. Trace ascites throughout the abdomen and pelvis, with extensive peritoneal thickening, nodularity, and omental caking. Findings are consistent with widespread peritoneal and omental metastatic disease. 2. No evidence of primary malignancy in the chest, abdomen, or pelvis, with specific attention to the most likely etiologies of peritoneal metastatic disease including pancreatic, ovarian, and endometrial malignancy. Although there is no obvious mass, pelvic ultrasound may be helpful to more closely assess the female reproductive organs. 3. Small left pleural effusion and associated atelectasis or consolidation, nonspecific although worrisome for malignant effusion. No overt pleural thickening or nodularity. 4. Scattered areas of nonspecific infectious or inflammatory alveolar ground-glass airspace  opacity throughout the lungs. 5. Cholelithiasis without evidence of acute cholecystitis. 6. Nonobstructive right nephrolithiasis. 7. Postoperative findings of same day laparoscopy including minimal pneumoperitoneum. Aortic Atherosclerosis (ICD10-I70.0). Electronically Signed   By: Delanna Ahmadi M.D.   On: 11/13/2021 17:19   CT CHEST W CONTRAST  Result Date: 11/13/2021 CLINICAL DATA:  Peritoneal and omental metastatic disease and malignant ascites identified at laparotomy for cholecystectomy * Tracking Code: BO * EXAM: CT CHEST WITH CONTRAST CT ABDOMEN AND PELVIS WITH AND WITHOUT CONTRAST TECHNIQUE: Multidetector CT imaging of the chest was performed during intravenous contrast administration. Multidetector CT imaging of the abdomen and pelvis was performed following the standard protocol before and during bolus administration of intravenous contrast. RADIATION DOSE REDUCTION: This exam was performed according to the departmental dose-optimization program which includes automated exposure control, adjustment of the mA and/or kV according to patient size and/or use of iterative reconstruction technique. CONTRAST:  165m OMNIPAQUE IOHEXOL 300 MG/ML  SOLN COMPARISON:  None Available. FINDINGS: CT CHEST FINDINGS Cardiovascular: Scattered aortic atherosclerosis. Normal heart size. No pericardial effusion. Mediastinum/Nodes: No enlarged mediastinal, hilar, or axillary lymph nodes. Thyroid gland, trachea, and esophagus demonstrate no significant findings. Lungs/Pleura: Small left pleural effusion associated atelectasis or consolidation. Scattered areas of alveolar ground-glass airspace opacity throughout the lungs (series 14, image 50). No pleural effusion or pneumothorax. Musculoskeletal: No chest wall mass or suspicious osseous lesions identified. Chronic, callused fractures of the posterior left ribs. CT ABDOMEN PELVIS FINDINGS Hepatobiliary: No solid liver abnormality is seen. Multiple gallstones. No gallbladder  wall thickening, or biliary dilatation. Pancreas: Unremarkable. No pancreatic ductal dilatation or surrounding inflammatory changes. Spleen: Normal in size without significant abnormality. Adrenals/Urinary Tract: Adrenal glands are unremarkable. Small nonobstructive calculus of the inferior pole of the right kidney (series 5, image 62). Left kidney is normal. No mass or suspicious contrast enhancement. Bladder is unremarkable. Stomach/Bowel: Stomach is within normal limits. Appendix is not clearly visualized. No evidence of bowel wall thickening, distention, or inflammatory changes. Colonic diverticulosis.  Vascular/Lymphatic: Aortic atherosclerosis. Left common iliac vein stent. Enlarged left and right cardiophrenic angle lymph nodes, measuring up to 1.5 x 1.1 cm (series 12, image 67). Reproductive: No mass or other abnormality. Other: Right upper quadrant and umbilical laparoscopy ports with subcutaneous fat stranding and small volume of subcutaneous air and fluid. Trace ascites throughout the abdomen and pelvis, with extensive peritoneal thickening, nodularity, and omental caking (series 12, image 41). Minimal postoperative pneumoperitoneum. Musculoskeletal: No acute osseous findings. IMPRESSION: 1. Trace ascites throughout the abdomen and pelvis, with extensive peritoneal thickening, nodularity, and omental caking. Findings are consistent with widespread peritoneal and omental metastatic disease. 2. No evidence of primary malignancy in the chest, abdomen, or pelvis, with specific attention to the most likely etiologies of peritoneal metastatic disease including pancreatic, ovarian, and endometrial malignancy. Although there is no obvious mass, pelvic ultrasound may be helpful to more closely assess the female reproductive organs. 3. Small left pleural effusion and associated atelectasis or consolidation, nonspecific although worrisome for malignant effusion. No overt pleural thickening or nodularity. 4. Scattered  areas of nonspecific infectious or inflammatory alveolar ground-glass airspace opacity throughout the lungs. 5. Cholelithiasis without evidence of acute cholecystitis. 6. Nonobstructive right nephrolithiasis. 7. Postoperative findings of same day laparoscopy including minimal pneumoperitoneum. Aortic Atherosclerosis (ICD10-I70.0). Electronically Signed   By: Delanna Ahmadi M.D.   On: 11/13/2021 17:19    Anti-infectives: Anti-infectives (From admission, onward)    Start     Dose/Rate Route Frequency Ordered Stop   11/13/21 1045  ciprofloxacin (CIPRO) IVPB 400 mg        400 mg 200 mL/hr over 60 Minutes Intravenous On call to O.R. 11/13/21 1008 11/13/21 1121   11/13/21 1009  ciprofloxacin (CIPRO) 400 MG/200ML IVPB       Note to Pharmacy: Richarda Osmond L: cabinet override      11/13/21 1009 11/13/21 1118       Assessment/Plan: s/p Procedure(s): Exploratory laparoscopy Imp:   Patient stable on postoperative day 1.  She does have some soft blood pressures which are probably related to the removal of 6 L of ascites.  Her blood pressure medication has been held.  Tissue pathology is still pending, but her CA125 is significantly elevated, consistent with ovarian malignancy.  The patient is fully aware of her diagnosis.  I discussed this with Dr. Delton Coombes who will see the patient next week as an outpatient and probably refer the patient to Dr. Berline Lopes of GYN oncology.  He has requested that a Port-A-Cath be placed.  I will place a Port-A-Cath tomorrow prior to the patient's discharge.  The risks and benefits of the procedure including bleeding, infection, and pneumothorax were fully explained to the patient, who gave informed consent.  LOS: 0 days    Aviva Signs 11/14/2021

## 2021-11-14 NOTE — Progress Notes (Signed)
Pt has had uneventful day. OOB independantly to ambulate in room and to bathroom. No c/o n/v, tolerating po well. Pt states increased soreness in abd/surgical sites but refuses to take any pain medication.  Spoke with pt regarding her diagnosis and coping skills. Pt states she has good family support from her children and grandchildren. Pt tearful but optimistic about treatment plan and prognosis. Offered encouragement. Pt understands plan for port placement tomorrow and follow up with oncologist.

## 2021-11-14 NOTE — Progress Notes (Signed)
  Transition of Care [facility]) Screening Note   Patient Details  Name: Nicole Santos Date of Birth: May 22, 1971   Transition of Care [facility]) CM/SW Contact:    Iona Beard, [place] Phone Number: 11/14/2021, 10:37 AM    Transition of Care Department [facility]) has reviewed patient and no TOC needs have been identified at this time. We will continue to monitor patient advancement through interdisciplinary progression rounds. If new patient transition needs arise, please place a TOC consult.

## 2021-11-14 NOTE — Progress Notes (Signed)
1 Day Post-Op  Subjective: Patient denies any significant incisional pain.  Her breathing is much better.  Objective: Vital signs in last 24 hours: Temp:  [96.2 F (35.7 C)-98.1 F (36.7 C)] 97.7 F (36.5 C) (06/13 0428) Pulse Rate:  [95-114] 105 (06/13 0946) Resp:  [16-20] 16 (06/13 0946) BP: (93-118)/(49-76) 97/54 (06/13 0946) SpO2:  [91 %-98 %] 98 % (06/13 0946) Weight:  [105 kg] 105 kg (06/12 1556) Last BM Date : 11/14/21  Intake/Output from previous day: 06/12 0701 - 06/13 0700 In: 1477.7 [P.O.:120; I.V.:1057.7; IV Piggyback:300] Out: -  Intake/Output this shift: No intake/output data recorded.  General appearance: alert, cooperative, and no distress Resp: clear to auscultation bilaterally Cardio: regular rate and rhythm, S1, S2 normal, no murmur, click, rub or gallop GI: Soft, incisions healing well.  Lab Results:  Recent Labs    11/14/21 0446  WBC 10.2  HGB 11.3*  HCT 36.1  PLT 535*   BMET Recent Labs    11/14/21 0446  NA 135  K 4.5  CL 106  CO2 24  GLUCOSE 99  BUN 12  CREATININE 0.63  CALCIUM 7.5*   PT/INR No results for input(s): "LABPROT", "INR" in the last 72 hours.  Studies/Results: US PELVIS (TRANSABDOMINAL ONLY)  Result Date: 11/14/2021 CLINICAL DATA:  Malignant ascites. EXAM: TRANSABDOMINAL ULTRASOUND OF PELVIS TECHNIQUE: Transabdominal ultrasound examination of the pelvis was performed including evaluation of the uterus, ovaries, adnexal regions, and pelvic cul-de-sac. Patient declined transvaginal imaging. COMPARISON:  CT scan of November 13, 2021. FINDINGS: Uterus Measurements: 4.2 x 3.5 x 3.0 cm = volume: 24 mL. No fibroids or other mass visualized. Endometrium Thickness: 8 mm which is within normal limits. No focal abnormality visualized. Right ovary Not visualized due to body habitus and overlying bowel gas. Left ovary Not visualized due to body habitus and overlying bowel gas. Other findings:  Small amount of ascites is noted in the pelvis.  IMPRESSION: Uterus is grossly unremarkable. Ovaries are not visualized due to body habitus and overlying bowel gas. Small amount of ascites is noted. Electronically Signed   By: Marijo Conception M.D.   On: 11/14/2021 10:34   CT ABDOMEN PELVIS W WO CONTRAST  Result Date: 11/13/2021 CLINICAL DATA:  Peritoneal and omental metastatic disease and malignant ascites identified at laparotomy for cholecystectomy * Tracking Code: BO * EXAM: CT CHEST WITH CONTRAST CT ABDOMEN AND PELVIS WITH AND WITHOUT CONTRAST TECHNIQUE: Multidetector CT imaging of the chest was performed during intravenous contrast administration. Multidetector CT imaging of the abdomen and pelvis was performed following the standard protocol before and during bolus administration of intravenous contrast. RADIATION DOSE REDUCTION: This exam was performed according to the departmental dose-optimization program which includes automated exposure control, adjustment of the mA and/or kV according to patient size and/or use of iterative reconstruction technique. CONTRAST:  172m OMNIPAQUE IOHEXOL 300 MG/ML  SOLN COMPARISON:  None Available. FINDINGS: CT CHEST FINDINGS Cardiovascular: Scattered aortic atherosclerosis. Normal heart size. No pericardial effusion. Mediastinum/Nodes: No enlarged mediastinal, hilar, or axillary lymph nodes. Thyroid gland, trachea, and esophagus demonstrate no significant findings. Lungs/Pleura: Small left pleural effusion associated atelectasis or consolidation. Scattered areas of alveolar ground-glass airspace opacity throughout the lungs (series 14, image 50). No pleural effusion or pneumothorax. Musculoskeletal: No chest wall mass or suspicious osseous lesions identified. Chronic, callused fractures of the posterior left ribs. CT ABDOMEN PELVIS FINDINGS Hepatobiliary: No solid liver abnormality is seen. Multiple gallstones. No gallbladder wall thickening, or biliary dilatation. Pancreas: Unremarkable. No pancreatic ductal  dilatation or surrounding inflammatory changes. Spleen: Normal in size without significant abnormality. Adrenals/Urinary Tract: Adrenal glands are unremarkable. Small nonobstructive calculus of the inferior pole of the right kidney (series 5, image 62). Left kidney is normal. No mass or suspicious contrast enhancement. Bladder is unremarkable. Stomach/Bowel: Stomach is within normal limits. Appendix is not clearly visualized. No evidence of bowel wall thickening, distention, or inflammatory changes. Colonic diverticulosis. Vascular/Lymphatic: Aortic atherosclerosis. Left common iliac vein stent. Enlarged left and right cardiophrenic angle lymph nodes, measuring up to 1.5 x 1.1 cm (series 12, image 67). Reproductive: No mass or other abnormality. Other: Right upper quadrant and umbilical laparoscopy ports with subcutaneous fat stranding and small volume of subcutaneous air and fluid. Trace ascites throughout the abdomen and pelvis, with extensive peritoneal thickening, nodularity, and omental caking (series 12, image 41). Minimal postoperative pneumoperitoneum. Musculoskeletal: No acute osseous findings. IMPRESSION: 1. Trace ascites throughout the abdomen and pelvis, with extensive peritoneal thickening, nodularity, and omental caking. Findings are consistent with widespread peritoneal and omental metastatic disease. 2. No evidence of primary malignancy in the chest, abdomen, or pelvis, with specific attention to the most likely etiologies of peritoneal metastatic disease including pancreatic, ovarian, and endometrial malignancy. Although there is no obvious mass, pelvic ultrasound may be helpful to more closely assess the female reproductive organs. 3. Small left pleural effusion and associated atelectasis or consolidation, nonspecific although worrisome for malignant effusion. No overt pleural thickening or nodularity. 4. Scattered areas of nonspecific infectious or inflammatory alveolar ground-glass airspace  opacity throughout the lungs. 5. Cholelithiasis without evidence of acute cholecystitis. 6. Nonobstructive right nephrolithiasis. 7. Postoperative findings of same day laparoscopy including minimal pneumoperitoneum. Aortic Atherosclerosis (ICD10-I70.0). Electronically Signed   By: Delanna Ahmadi M.D.   On: 11/13/2021 17:19   CT CHEST W CONTRAST  Result Date: 11/13/2021 CLINICAL DATA:  Peritoneal and omental metastatic disease and malignant ascites identified at laparotomy for cholecystectomy * Tracking Code: BO * EXAM: CT CHEST WITH CONTRAST CT ABDOMEN AND PELVIS WITH AND WITHOUT CONTRAST TECHNIQUE: Multidetector CT imaging of the chest was performed during intravenous contrast administration. Multidetector CT imaging of the abdomen and pelvis was performed following the standard protocol before and during bolus administration of intravenous contrast. RADIATION DOSE REDUCTION: This exam was performed according to the departmental dose-optimization program which includes automated exposure control, adjustment of the mA and/or kV according to patient size and/or use of iterative reconstruction technique. CONTRAST:  169m OMNIPAQUE IOHEXOL 300 MG/ML  SOLN COMPARISON:  None Available. FINDINGS: CT CHEST FINDINGS Cardiovascular: Scattered aortic atherosclerosis. Normal heart size. No pericardial effusion. Mediastinum/Nodes: No enlarged mediastinal, hilar, or axillary lymph nodes. Thyroid gland, trachea, and esophagus demonstrate no significant findings. Lungs/Pleura: Small left pleural effusion associated atelectasis or consolidation. Scattered areas of alveolar ground-glass airspace opacity throughout the lungs (series 14, image 50). No pleural effusion or pneumothorax. Musculoskeletal: No chest wall mass or suspicious osseous lesions identified. Chronic, callused fractures of the posterior left ribs. CT ABDOMEN PELVIS FINDINGS Hepatobiliary: No solid liver abnormality is seen. Multiple gallstones. No gallbladder  wall thickening, or biliary dilatation. Pancreas: Unremarkable. No pancreatic ductal dilatation or surrounding inflammatory changes. Spleen: Normal in size without significant abnormality. Adrenals/Urinary Tract: Adrenal glands are unremarkable. Small nonobstructive calculus of the inferior pole of the right kidney (series 5, image 62). Left kidney is normal. No mass or suspicious contrast enhancement. Bladder is unremarkable. Stomach/Bowel: Stomach is within normal limits. Appendix is not clearly visualized. No evidence of bowel wall thickening, distention, or inflammatory changes. Colonic diverticulosis.  Vascular/Lymphatic: Aortic atherosclerosis. Left common iliac vein stent. Enlarged left and right cardiophrenic angle lymph nodes, measuring up to 1.5 x 1.1 cm (series 12, image 67). Reproductive: No mass or other abnormality. Other: Right upper quadrant and umbilical laparoscopy ports with subcutaneous fat stranding and small volume of subcutaneous air and fluid. Trace ascites throughout the abdomen and pelvis, with extensive peritoneal thickening, nodularity, and omental caking (series 12, image 41). Minimal postoperative pneumoperitoneum. Musculoskeletal: No acute osseous findings. IMPRESSION: 1. Trace ascites throughout the abdomen and pelvis, with extensive peritoneal thickening, nodularity, and omental caking. Findings are consistent with widespread peritoneal and omental metastatic disease. 2. No evidence of primary malignancy in the chest, abdomen, or pelvis, with specific attention to the most likely etiologies of peritoneal metastatic disease including pancreatic, ovarian, and endometrial malignancy. Although there is no obvious mass, pelvic ultrasound may be helpful to more closely assess the female reproductive organs. 3. Small left pleural effusion and associated atelectasis or consolidation, nonspecific although worrisome for malignant effusion. No overt pleural thickening or nodularity. 4. Scattered  areas of nonspecific infectious or inflammatory alveolar ground-glass airspace opacity throughout the lungs. 5. Cholelithiasis without evidence of acute cholecystitis. 6. Nonobstructive right nephrolithiasis. 7. Postoperative findings of same day laparoscopy including minimal pneumoperitoneum. Aortic Atherosclerosis (ICD10-I70.0). Electronically Signed   By: Delanna Ahmadi M.D.   On: 11/13/2021 17:19    Anti-infectives: Anti-infectives (From admission, onward)    Start     Dose/Rate Route Frequency Ordered Stop   11/13/21 1045  ciprofloxacin (CIPRO) IVPB 400 mg        400 mg 200 mL/hr over 60 Minutes Intravenous On call to O.R. 11/13/21 1008 11/13/21 1121   11/13/21 1009  ciprofloxacin (CIPRO) 400 MG/200ML IVPB       Note to Pharmacy: Richarda Osmond L: cabinet override      11/13/21 1009 11/13/21 1118       Assessment/Plan: s/p Procedure(s): Exploratory laparoscopy Imp:   Patient stable on postoperative day 1.  She does have some soft blood pressures which are probably related to the removal of 6 L of ascites.  Her blood pressure medication has been held.  Tissue pathology is still pending, but her CA125 is significantly elevated, consistent with ovarian malignancy.  The patient is fully aware of her diagnosis.  I discussed this with Dr. Delton Coombes who will see the patient next week as an outpatient and probably refer the patient to Dr. Berline Lopes of GYN oncology.  He has requested that a Port-A-Cath be placed.  I will place a Port-A-Cath tomorrow prior to the patient's discharge.  The risks and benefits of the procedure including bleeding, infection, and pneumothorax were fully explained to the patient, who gave informed consent.  LOS: 0 days    Aviva Signs 11/14/2021

## 2021-11-15 ENCOUNTER — Encounter (HOSPITAL_COMMUNITY)
Admission: RE | Disposition: A | Discharge status: Home / Self Care | Payer: Self-pay | Source: Home / Self Care | Attending: General Surgery

## 2021-11-15 ENCOUNTER — Encounter (HOSPITAL_COMMUNITY): Payer: Self-pay | Admitting: General Surgery

## 2021-11-15 ENCOUNTER — Observation Stay (HOSPITAL_COMMUNITY): Payer: Commercial Managed Care - PPO

## 2021-11-15 ENCOUNTER — Other Ambulatory Visit: Payer: Self-pay

## 2021-11-15 ENCOUNTER — Observation Stay (HOSPITAL_BASED_OUTPATIENT_CLINIC_OR_DEPARTMENT_OTHER): Payer: Commercial Managed Care - PPO | Admitting: Certified Registered"

## 2021-11-15 ENCOUNTER — Observation Stay (HOSPITAL_COMMUNITY): Payer: Commercial Managed Care - PPO | Admitting: Certified Registered"

## 2021-11-15 DIAGNOSIS — Z6841 Body Mass Index (BMI) 40.0 and over, adult: Secondary | ICD-10-CM

## 2021-11-15 DIAGNOSIS — I10 Essential (primary) hypertension: Secondary | ICD-10-CM | POA: Diagnosis not present

## 2021-11-15 DIAGNOSIS — Z7985 Long-term (current) use of injectable non-insulin antidiabetic drugs: Secondary | ICD-10-CM

## 2021-11-15 DIAGNOSIS — C7982 Secondary malignant neoplasm of genital organs: Secondary | ICD-10-CM | POA: Diagnosis not present

## 2021-11-15 DIAGNOSIS — E119 Type 2 diabetes mellitus without complications: Secondary | ICD-10-CM | POA: Diagnosis not present

## 2021-11-15 DIAGNOSIS — C569 Malignant neoplasm of unspecified ovary: Secondary | ICD-10-CM | POA: Diagnosis not present

## 2021-11-15 DIAGNOSIS — K8020 Calculus of gallbladder without cholecystitis without obstruction: Secondary | ICD-10-CM | POA: Diagnosis not present

## 2021-11-15 HISTORY — PX: PORTACATH PLACEMENT: SHX2246

## 2021-11-15 LAB — GLUCOSE, CAPILLARY
Glucose-Capillary: 95 mg/dL (ref 70–99)
Glucose-Capillary: 85 mg/dL (ref 70–99)

## 2021-11-15 LAB — POCT I-STAT CREATININE: Creatinine, Ser: 0.8 mg/dL (ref 0.44–1.00)

## 2021-11-15 SURGERY — INSERTION, TUNNELED CENTRAL VENOUS DEVICE, WITH PORT
Anesthesia: General | Site: Chest | Laterality: Left

## 2021-11-15 MED ORDER — ONDANSETRON HCL 4 MG/2ML IJ SOLN
INTRAMUSCULAR | Status: AC
  Filled 2021-11-15: qty 4

## 2021-11-15 MED ORDER — LACTATED RINGERS IV SOLN
INTRAVENOUS | Status: DC
  Administered 2021-11-15: 1000 mL via INTRAVENOUS

## 2021-11-15 MED ORDER — CHLORHEXIDINE GLUCONATE 0.12 % MT SOLN
15.0000 mL | Freq: Once | OROMUCOSAL | Status: AC
  Administered 2021-11-15: 15 mL via OROMUCOSAL

## 2021-11-15 MED ORDER — LIDOCAINE HCL (CARDIAC) PF 100 MG/5ML IV SOSY
PREFILLED_SYRINGE | INTRAVENOUS | Status: DC | PRN
  Administered 2021-11-15: 40 mg via INTRAVENOUS

## 2021-11-15 MED ORDER — FENTANYL CITRATE (PF) 100 MCG/2ML IJ SOLN
INTRAMUSCULAR | Status: DC | PRN
  Administered 2021-11-15 (×2): 25 ug via INTRAVENOUS

## 2021-11-15 MED ORDER — MIDAZOLAM HCL 5 MG/5ML IJ SOLN
INTRAMUSCULAR | Status: DC | PRN
  Administered 2021-11-15: 2 mg via INTRAVENOUS

## 2021-11-15 MED ORDER — PROPOFOL 10 MG/ML IV BOLUS
INTRAVENOUS | Status: AC
  Filled 2021-11-15: qty 20

## 2021-11-15 MED ORDER — HEPARIN SOD (PORK) LOCK FLUSH 100 UNIT/ML IV SOLN
INTRAVENOUS | Status: AC
  Filled 2021-11-15: qty 5

## 2021-11-15 MED ORDER — ONDANSETRON HCL 4 MG/2ML IJ SOLN
INTRAMUSCULAR | Status: AC
  Filled 2021-11-15: qty 2

## 2021-11-15 MED ORDER — LIDOCAINE HCL (PF) 2 % IJ SOLN
INTRAMUSCULAR | Status: AC
  Filled 2021-11-15: qty 5

## 2021-11-15 MED ORDER — MIDAZOLAM HCL 2 MG/2ML IJ SOLN
INTRAMUSCULAR | Status: AC
  Filled 2021-11-15: qty 2

## 2021-11-15 MED ORDER — LIDOCAINE HCL (PF) 1 % IJ SOLN
INTRAMUSCULAR | Status: DC | PRN
  Administered 2021-11-15: 9 mL

## 2021-11-15 MED ORDER — HYDROCODONE-ACETAMINOPHEN 10-325 MG PO TABS: 1.0000 | ORAL_TABLET | Freq: Four times a day (QID) | ORAL | 0 refills | Status: DC | PRN

## 2021-11-15 MED ORDER — DEXMEDETOMIDINE (PRECEDEX) IN NS 20 MCG/5ML (4 MCG/ML) IV SYRINGE
PREFILLED_SYRINGE | INTRAVENOUS | Status: DC | PRN
  Administered 2021-11-15 (×3): 4 ug via INTRAVENOUS

## 2021-11-15 MED ORDER — HEPARIN SOD (PORK) LOCK FLUSH 100 UNIT/ML IV SOLN
INTRAVENOUS | Status: DC | PRN
  Administered 2021-11-15: 500 [IU] via INTRAVENOUS

## 2021-11-15 MED ORDER — ONDANSETRON HCL 4 MG/2ML IJ SOLN
INTRAMUSCULAR | Status: DC | PRN
  Administered 2021-11-15: 4 mg via INTRAVENOUS

## 2021-11-15 MED ORDER — LIDOCAINE HCL (PF) 1 % IJ SOLN
INTRAMUSCULAR | Status: AC
  Filled 2021-11-15: qty 30

## 2021-11-15 MED ORDER — DEXAMETHASONE SODIUM PHOSPHATE 10 MG/ML IJ SOLN
INTRAMUSCULAR | Status: AC
  Filled 2021-11-15: qty 1

## 2021-11-15 MED ORDER — FENTANYL CITRATE (PF) 100 MCG/2ML IJ SOLN
INTRAMUSCULAR | Status: AC
  Filled 2021-11-15: qty 2

## 2021-11-15 MED ORDER — FENTANYL CITRATE PF 50 MCG/ML IJ SOSY: 25.0000 ug | PREFILLED_SYRINGE | INTRAMUSCULAR | Status: DC | PRN

## 2021-11-15 MED ORDER — ORAL CARE MOUTH RINSE: 15.0000 mL | Freq: Once | OROMUCOSAL | Status: AC

## 2021-11-15 MED ORDER — ONDANSETRON HCL 4 MG PO TABS: 4.0000 mg | ORAL_TABLET | Freq: Three times a day (TID) | ORAL | 1 refills | Status: DC | PRN

## 2021-11-15 MED ORDER — ONDANSETRON HCL 4 MG/2ML IJ SOLN
4.0000 mg | Freq: Once | INTRAMUSCULAR | Status: DC | PRN
Start: 2021-11-15 — End: 2021-11-15

## 2021-11-15 MED ORDER — SODIUM CHLORIDE (PF) 0.9 % IJ SOLN
INTRAMUSCULAR | Status: DC | PRN
  Administered 2021-11-15: 500 mL

## 2021-11-15 MED ORDER — KETOROLAC TROMETHAMINE 30 MG/ML IJ SOLN
INTRAMUSCULAR | Status: AC
  Filled 2021-11-15: qty 3

## 2021-11-15 MED ORDER — PROPOFOL 10 MG/ML IV BOLUS
INTRAVENOUS | Status: DC | PRN
  Administered 2021-11-15: 20 mg via INTRAVENOUS
  Administered 2021-11-15: 10 mg via INTRAVENOUS
  Administered 2021-11-15 (×2): 20 mg via INTRAVENOUS
  Administered 2021-11-15 (×2): 10 mg via INTRAVENOUS

## 2021-11-15 MED ORDER — SODIUM CHLORIDE 0.9 % IV SOLN: INTRAVENOUS | Status: DC

## 2021-11-15 SURGICAL SUPPLY — 30 items
ADH SKN CLS APL DERMABOND .7 (GAUZE/BANDAGES/DRESSINGS) ×1
APL PRP STRL LF ISPRP CHG 10.5 (MISCELLANEOUS) ×1
APPLICATOR CHLORAPREP 10.5 ORG (MISCELLANEOUS) ×2 IMPLANT
BAG DECANTER FOR FLEXI CONT (MISCELLANEOUS) ×2 IMPLANT
CLOTH BEACON ORANGE TIMEOUT ST (SAFETY) ×2 IMPLANT
COVER LIGHT HANDLE STERIS (MISCELLANEOUS) ×4 IMPLANT
DECANTER SPIKE VIAL GLASS SM (MISCELLANEOUS) ×2 IMPLANT
DERMABOND ADVANCED (GAUZE/BANDAGES/DRESSINGS) ×1
DERMABOND ADVANCED .7 DNX12 (GAUZE/BANDAGES/DRESSINGS) ×1 IMPLANT
DRAPE C-ARM FOLDED MOBILE STRL (DRAPES) ×2 IMPLANT
ELECT REM PT RETURN 9FT ADLT (ELECTROSURGICAL) ×2
ELECTRODE REM PT RTRN 9FT ADLT (ELECTROSURGICAL) ×1 IMPLANT
GLOVE BIOGEL PI IND STRL 7.0 (GLOVE) ×2 IMPLANT
GLOVE BIOGEL PI INDICATOR 7.0 (GLOVE) ×2
GLOVE SURG SS PI 7.5 STRL IVOR (GLOVE) ×2 IMPLANT
GOWN STRL REUS W/TWL LRG LVL3 (GOWN DISPOSABLE) ×4 IMPLANT
IV NS 500ML (IV SOLUTION) ×2
IV NS 500ML BAXH (IV SOLUTION) ×1 IMPLANT
KIT PORT POWER 8FR ISP MRI (Port) ×2 IMPLANT
KIT TURNOVER KIT A (KITS) ×2 IMPLANT
NDL HYPO 25X1 1.5 SAFETY (NEEDLE) ×1 IMPLANT
NEEDLE HYPO 25X1 1.5 SAFETY (NEEDLE) ×2 IMPLANT
PACK MINOR (CUSTOM PROCEDURE TRAY) ×2 IMPLANT
PAD ARMBOARD 7.5X6 YLW CONV (MISCELLANEOUS) ×2 IMPLANT
SET BASIN LINEN APH (SET/KITS/TRAYS/PACK) ×2 IMPLANT
SUT MNCRL AB 4-0 PS2 18 (SUTURE) ×2 IMPLANT
SUT VIC AB 3-0 SH 27 (SUTURE) ×2
SUT VIC AB 3-0 SH 27X BRD (SUTURE) ×1 IMPLANT
SYR 5ML LL (SYRINGE) ×2 IMPLANT
SYR CONTROL 10ML LL (SYRINGE) ×2 IMPLANT

## 2021-11-15 NOTE — Anesthesia Preprocedure Evaluation (Signed)
Anesthesia Evaluation  Patient identified by MRN, date of birth, ID band Patient awake    Reviewed: Allergy & Precautions, H&P , NPO status , Patient's Chart, lab work & pertinent test results, reviewed documented beta blocker date and time   Airway Mallampati: II  TM Distance: >3 FB Neck ROM: full    Dental no notable dental hx.    Pulmonary neg pulmonary ROS,    Pulmonary exam normal breath sounds clear to auscultation       Cardiovascular Exercise Tolerance: Good hypertension, + dysrhythmias  Rhythm:regular Rate:Tachycardia     Neuro/Psych PSYCHIATRIC DISORDERS Anxiety Depression  Neuromuscular disease    GI/Hepatic negative GI ROS, Neg liver ROS,   Endo/Other  diabetesMorbid obesity  Renal/GU negative Renal ROS  negative genitourinary   Musculoskeletal   Abdominal   Peds  Hematology negative hematology ROS (+)   Anesthesia Other Findings Chronic tachycardia  Reproductive/Obstetrics negative OB ROS                             Anesthesia Physical  Anesthesia Plan  ASA: 3  Anesthesia Plan: General   Post-op Pain Management:    Induction:   PONV Risk Score and Plan: Propofol infusion  Airway Management Planned:   Additional Equipment:   Intra-op Plan:   Post-operative Plan:   Informed Consent: I have reviewed the patients History and Physical, chart, labs and discussed the procedure including the risks, benefits and alternatives for the proposed anesthesia with the patient or authorized representative who has indicated his/her understanding and acceptance.     Dental Advisory Given  Plan Discussed with: CRNA  Anesthesia Plan Comments:         Anesthesia Quick Evaluation

## 2021-11-15 NOTE — Op Note (Signed)
Patient:  Nicole Santos  DOB:  [birth year]  MRN:  [id number]   Preop Diagnosis: Metastatic ovarian carcinoma, need for central venous access  Postop Diagnosis: Same  Procedure: Port-A-Cath insertion  Surgeon: Aviva Signs, MD  Anes: MAC  Indications: Patient is a 50 year old white female recently diagnosed with metastatic ovarian carcinoma who now presents for Port-A-Cath insertion.  The risks and benefits of the procedure including bleeding, infection, and pneumothorax were fully explained to the patient, who gave informed consent.  Procedure note: The patient was placed in the Trendelenburg position after the left upper chest was prepped and draped using the usual sterile technique with ChloraPrep.  Surgical site confirmation was performed.  1% Xylocaine was used for local anesthesia.  An incision was made below the left clavicle.  A subcutaneous pocket was formed.  A needle was advanced into the left subclavian vein using the Seldinger technique without difficulty.  A guidewire was then advanced into the right atrium under fluoroscopic guidance.  An introducer and peel-away sheath were placed over the guidewire.  The catheter was inserted through the peel-away sheath and the peel-away sheath was removed.  The cath was then attached to the port and the port placed in subcutaneous pocket.  Adequate positioning was confirmed by fluoroscopy.  Good backflow of venous blood was noted on aspiration of the port.  The port was flushed with heparin flush.  The subcutaneous layer was reapproximated using a 3-0 Vicryl interrupted suture.  The skin was closed using a 4-0 Monocryl subcuticular suture.  Dermabond was applied.  All tape and needle counts were correct at the end of the procedure.  The patient was awakened and transferred to PACU in stable condition.  A chest x-ray will be performed at that time.  Complications: None  EBL: Minimal  Specimen: None

## 2021-11-15 NOTE — Discharge Summary (Signed)
Physician Discharge Summary  Patient ID: Nicole Santos MRN: [id number] DOB/AGE: 11-27-1970 51 y.o.  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  I intended to do a laparoscopic cholecystectomy on 11/13/2021.  Upon entering the abdomen, 6 L of bloody ascites was seen.  There was also evidence of peritoneal implants on the diaphragm and anterior abdominal wall as well as on the omentum.  Biopsies of the omentum, ascites, and peritoneal implants were taken.  Because of these findings, a cholecystectomy was not performed.  A CT scan of the chest, abdomen, pelvis were done postoperatively.  This significant findings were consistent with carcinomatosis secondary to either pancreas or ovarian origins.  Multiple tumor markers were sent off and the only significantly elevated test was CEA-125, consistent with ovarian pathology.  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  A pelvic ultrasound was performed, but was limited due to body habitus.  Patient refused transvaginal probe.  Dr. Delton Coombes of oncology was consulted.  The patient is fully aware of the diagnosis of ovarian carcinoma.  The Port-A-Cath was placed on 11/15/2021.  The patient is being discharged home in good and stable condition.  She will be following up with Dr. Delton Coombes in 1 week.  Dr. Delton Coombes intends to refer the patient to Dr. Berline Lopes of GYN oncology.  Consults:  Hematology/oncology   Treatments: surgery: Diagnostic laparoscopy with biopsies on 11/13/2021  Discharge Exam: Blood pressure 109/71,  pulse (!) 102, temperature 97.9 F (36.6 C), resp. rate (!) 22, height '5\' 2"'$  (1.575 m), weight 105 kg, SpO2 93 %. General appearance: alert, cooperative, and no distress Resp: clear to auscultation bilaterally Cardio: regular rate and rhythm, S1, S2 normal, no murmur, click, rub or gallop GI: Soft, incisions healing well.  Disposition: Discharge disposition: 01-Home or Self Care       Discharge Instructions     Diet - low sodium heart healthy   Complete by: As directed    Increase activity slowly   Complete by: As directed       Allergies as of 11/15/2021       Reactions   Augmentin [amoxicillin-pot Clavulanate] Diarrhea, Nausea And Vomiting   Doxycycline    Abdominal cramps.   Topamax [topiramate] Nausea And Vomiting        Medication List     STOP taking these medications    lisinopril 5 MG tablet Commonly known as: ZESTRIL       TAKE these medications    aspirin EC 81 MG tablet Take 81 mg by mouth daily.   atorvastatin 20 MG tablet Commonly known as: LIPITOR Take 1 tablet (20 mg total) by mouth daily.   cimetidine 200 MG tablet Commonly known as: TAGAMET Take 200 mg by mouth daily.   HYDROcodone-acetaminophen 10-325 MG tablet Commonly known as: Norco Take 1 tablet by mouth every 6 (six) hours as needed.   ibuprofen 200 MG tablet Commonly known as: ADVIL Take 200 mg by mouth every 6 (six) hours as needed for mild pain or moderate pain.   ondansetron 4 MG tablet Commonly known as: Zofran Take 1 tablet (4 mg total) by mouth every 8 (eight) hours as needed for  nausea or vomiting.   Semaglutide (2 MG/DOSE) 8 MG/3ML Sopn Inject 2 mg as directed once a week.        Follow-up Information     Derek Jack, MD. Call.   Specialty: Hematology Why: Oncology should call you with an appointment Contact information: [place] [address] [phone]         Aviva Signs, MD Follow up.   Specialty: General Surgery Why:  As needed.  Will call you next week for follow up. Contact information: [id number] [place] [address] [phone]                 Signed: Aviva Signs 11/15/2021, 12:38 PM

## 2021-11-15 NOTE — Interval H&P Note (Signed)
History and Physical Interval Note:  11/15/2021 9:15 AM  Nicole Santos  has presented today for surgery, with the diagnosis of ovarian cancer.  The various methods of treatment have been discussed with the patient and family. After consideration of risks, benefits and other options for treatment, the patient has consented to  Procedure(s): INSERTION PORT-A-CATH (Left) as a surgical intervention.  The patient's history has been reviewed, patient examined, no change in status, stable for surgery.  I have reviewed the patient's chart and labs.  Questions were answered to the patient's satisfaction.     Aviva Signs

## 2021-11-15 NOTE — Transfer of Care (Signed)
Immediate Anesthesia Transfer of Care Note  Patient: Nicole Santos  Procedure(s) Performed: INSERTION PORT-A-CATH (Left: Chest)  Patient Location: PACU  Anesthesia Type:MAC  Level of Consciousness: awake, alert  and oriented  Airway & Oxygen Therapy: Patient Spontanous Breathing  Post-op Assessment: Report given to RN and Post -op Vital signs reviewed and stable  Post vital signs: Reviewed and stable  Last Vitals:  Vitals Value Taken Time  BP 110/74 11/15/21 1153  Temp    Pulse 94 11/15/21 1154  Resp 23 11/15/21 1154  SpO2 95 % 11/15/21 1154  Vitals shown include unvalidated device data.  Last Pain:  Vitals:   11/15/21 0934  TempSrc: Oral  PainSc: 8       Patients Stated Pain Goal: 6 (96/43/83 8184)  Complications: No notable events documented.

## 2021-11-17 ENCOUNTER — Encounter (HOSPITAL_COMMUNITY): Payer: Self-pay | Admitting: Hematology

## 2021-11-17 ENCOUNTER — Telehealth: Payer: Self-pay | Admitting: *Deleted

## 2021-11-17 ENCOUNTER — Inpatient Hospital Stay (HOSPITAL_COMMUNITY): Payer: Commercial Managed Care - PPO | Attending: Hematology | Admitting: Hematology

## 2021-11-17 DIAGNOSIS — Z8541 Personal history of malignant neoplasm of cervix uteri: Secondary | ICD-10-CM | POA: Insufficient documentation

## 2021-11-17 DIAGNOSIS — Z8052 Family history of malignant neoplasm of bladder: Secondary | ICD-10-CM | POA: Insufficient documentation

## 2021-11-17 DIAGNOSIS — C482 Malignant neoplasm of peritoneum, unspecified: Secondary | ICD-10-CM | POA: Insufficient documentation

## 2021-11-17 DIAGNOSIS — Z5189 Encounter for other specified aftercare: Secondary | ICD-10-CM | POA: Insufficient documentation

## 2021-11-17 DIAGNOSIS — Z803 Family history of malignant neoplasm of breast: Secondary | ICD-10-CM | POA: Insufficient documentation

## 2021-11-17 DIAGNOSIS — Z5111 Encounter for antineoplastic chemotherapy: Secondary | ICD-10-CM | POA: Diagnosis present

## 2021-11-17 NOTE — Progress Notes (Signed)
START ON PATHWAY REGIMEN - Ovarian     A cycle is every 21 days:     Paclitaxel      Carboplatin   **Always confirm dose/schedule in your pharmacy ordering system**  Patient Characteristics: Preoperative or Nonsurgical Candidate (Clinical Staging), Newly Diagnosed, Neoadjuvant Therapy followed by Surgery BRCA Mutation Status: Awaiting Test Results Therapeutic Status: Preoperative or Nonsurgical Candidate (Clinical Staging) AJCC T Category: cT3b AJCC 8 Stage Grouping: IIIB AJCC N Category: cN0 AJCC M Category: cM0 Therapy Plan: Neoadjuvant Therapy followed by Surgery Intent of Therapy: Curative Intent, Discussed with Patient 

## 2021-11-17 NOTE — Progress Notes (Signed)
Nicole Santos [address] [place] [address]   CLINIC:  Medical Oncology/Hematology  CONSULT NOTE  Patient Care Team: Loman Brooklyn, FNP as PCP - General (Family Medicine) Harlen Labs, MD as Referring Physician (Optometry) Derek Jack, MD as Medical Oncologist (Medical Oncology) Brien Mates, RN as Oncology Nurse Navigator (Medical Oncology)  CHIEF COMPLAINTS/PURPOSE OF CONSULTATION:  Evaluation for high-grade serous carcinoma of peritoneum  HISTORY OF PRESENTING ILLNESS:  Nicole Santos 51 y.o. female is here because of high-grade serous carcinoma of peritoneum, at the request of Inpatient.  Today she reports feeling good, and she is accompanied by her husband. She reports respiratory infection starting 3 months ago. She reports left sided abdominal pain starting on 5/14; the night of 5/14 she reports multiple episodes of vomiting, but that has not recurred although she continues to have abdominal pain. For this pain she was given 10/325 MG hydrocodone although she has not taken any. She has not been eating well as she reports eating is painful. She reports has lost 30 lbs over the past 6 months. She has a history of cervical cancer treated with chemotherapy and radiation. She denies vaginal bleeding. She denies numbness/tingling. She reports history of DM. She denies history of CVA and MI.   Her maternal great grandmother had breast cancer, and her maternal grandmother had bladder cancer. Her daily home activities have been limited for the past month. She previously worked in Scientist, research (medical) at [employer]. She denies smoking history and history of alcohol consumption.   MEDICAL HISTORY:  Past Medical History:  Diagnosis Date   Acute respiratory disease due to COVID-19 virus 02/02/2019   Anxiety    Cancer ([facility])    Depression    Diabetes mellitus without complication (HCC)    Dyspnea    Dysrhythmia    Hypertension    Sleep apnea    Tachycardia      SURGICAL HISTORY: Past Surgical History:  Procedure Laterality Date   KNEE SURGERY Right    Arthroscopic   LAPAROSCOPIC ABDOMINAL EXPLORATION N/A 11/13/2021   Procedure: Exploratory laparoscopy;  Surgeon: Aviva Signs, MD;  Location: [facility];  Service: General;  Laterality: N/A;   TONSILLECTOMY AND ADENOIDECTOMY      SOCIAL HISTORY: Social History   Socioeconomic History   Marital status: Married    Spouse name: Not on file   Number of children: Not on file   Years of education: Not on file   Highest education level: Not on file  Occupational History   Occupation: Unemployed  Tobacco Use   Smoking status: Never   Smokeless tobacco: Never  Vaping Use   Vaping Use: Never used  Substance and Sexual Activity   Alcohol use: No   Drug use: No   Sexual activity: Not on file  Other Topics Concern   Not on file  Social History Narrative   Not on file   Social Determinants of Health   Financial Resource Strain: Not on file  Food Insecurity: Not on file  Transportation Needs: Not on file  Physical Activity: Not on file  Stress: Not on file  Social Connections: Not on file  Intimate Partner Violence: Not on file    FAMILY HISTORY: Family History  Problem Relation Age of Onset   Hypertension Mother    Breast cancer Maternal Great-grandmother     ALLERGIES:  is allergic to augmentin [amoxicillin-pot clavulanate], doxycycline, and topamax [topiramate].  MEDICATIONS:  Current Outpatient Medications  Medication  Sig Dispense Refill   aspirin EC 81 MG tablet Take 81 mg by mouth daily.     atorvastatin (LIPITOR) 20 MG tablet Take 1 tablet (20 mg total) by mouth daily. 90 tablet 1   cimetidine (TAGAMET) 200 MG tablet Take 200 mg by mouth daily.     HYDROcodone-acetaminophen (NORCO) 10-325 MG tablet Take 1 tablet by mouth every 6 (six) hours as needed. 25 tablet 0   ibuprofen (ADVIL) 200 MG tablet Take 200 mg by mouth every 6 (six) hours as needed for mild pain or  moderate pain.     Semaglutide, 2 MG/DOSE, 8 MG/3ML SOPN Inject 2 mg as directed once a week. 9 mL 1   ondansetron (ZOFRAN) 4 MG tablet Take 1 tablet (4 mg total) by mouth every 8 (eight) hours as needed for nausea or vomiting. (Patient not taking: Reported on 11/17/2021) 20 tablet 1   No current facility-administered medications for this visit.    REVIEW OF SYSTEMS:   Review of Systems  Constitutional:  Positive for fatigue and unexpected weight change. Negative for appetite change.  Respiratory:  Positive for cough and shortness of breath.   Gastrointestinal:  Positive for abdominal pain (9/10 LUQ), constipation and diarrhea.  Genitourinary:  Negative for vaginal bleeding.   Neurological:  Positive for dizziness and numbness (R thigh).  Psychiatric/Behavioral:  Positive for sleep disturbance.   All other systems reviewed and are negative.    PHYSICAL EXAMINATION: ECOG PERFORMANCE STATUS: 1 - Symptomatic but completely ambulatory  Vitals:   11/17/21 1222  BP: 127/81  Pulse: 98  Resp: 19  Temp: (!) 97.4 F (36.3 C)  SpO2: 97%   Filed Weights   11/17/21 1222  Weight: 236 lb 3.2 oz (107.1 kg)   Physical Exam Vitals reviewed.  Constitutional:      Appearance: Normal appearance. She is obese.  Cardiovascular:     Rate and Rhythm: Normal rate and regular rhythm.     Pulses: Normal pulses.     Heart sounds: Normal heart sounds.  Pulmonary:     Effort: Pulmonary effort is normal.     Breath sounds: Normal breath sounds.  Abdominal:     Palpations: Abdomen is soft. There is no hepatomegaly, splenomegaly or mass.     Tenderness: There is no abdominal tenderness.  Lymphadenopathy:     Upper Body:     Right upper body: No supraclavicular or axillary adenopathy.     Left upper body: No supraclavicular or axillary adenopathy.     Lower Body: No right inguinal adenopathy. No left inguinal adenopathy.  Neurological:     General: No focal deficit present.     Mental Status: She  is alert and oriented to person, place, and time.  Psychiatric:        Mood and Affect: Mood normal.        Behavior: Behavior normal.      LABORATORY DATA:  I have reviewed the data as listed    Latest Ref Rng & Units 11/14/2021    4:46 AM 09/19/2021   10:28 AM 06/21/2021   10:06 AM  CBC  WBC 4.0 - 10.5 K/uL 10.2  4.7  5.4   Hemoglobin 12.0 - 15.0 g/dL 11.3  13.8  14.4   Hematocrit 36.0 - 46.0 % 36.1  40.7  42.5   Platelets 150 - 400 K/uL 535  415  352       Latest Ref Rng & Units 11/14/2021    4:46 AM 11/13/2021  4:16 PM 09/19/2021   10:28 AM  CMP  Glucose 70 - 99 mg/dL 99   108   BUN 6 - 20 mg/dL 12   8   Creatinine 0.44 - 1.00 mg/dL 0.63  0.80  0.82   Sodium 135 - 145 mmol/L 135   138   Potassium 3.5 - 5.1 mmol/L 4.5   4.7   Chloride 98 - 111 mmol/L 106   99   CO2 22 - 32 mmol/L 24   22   Calcium 8.9 - 10.3 mg/dL 7.5   8.9   Total Protein 6.5 - 8.1 g/dL 4.9   6.4   Total Bilirubin 0.3 - 1.2 mg/dL 0.6   0.8   Alkaline Phos 38 - 126 U/L 30   61   AST 15 - 41 U/L 19   42   ALT 0 - 44 U/L 15   41     RADIOGRAPHIC STUDIES: I have personally reviewed the radiological images as listed and agreed with the findings in the report. DG Chest Port 1 View  Result Date: 11/15/2021 CLINICAL DATA:  A 51 year old female presents for evaluation of Port-A-Cath placement. EXAM: PORTABLE CHEST 1 VIEW COMPARISON:  February 02, 2019 and December 13, 2021. FINDINGS: Interval placement of LEFT subclavian Port-A-Cath, tip in the mid to distal superior vena cava. Cardiomediastinal contours and hilar structures are normal. No lobar consolidation. No sign of pleural effusion. No visible pneumothorax. On limited assessment there is no acute skeletal finding. Signs of prior trauma to the LEFT hemithorax involving lateral ribs. IMPRESSION: Interval placement of LEFT subclavian Port-A-Cath, tip in the mid to distal superior vena cava. No acute cardiopulmonary disease. Electronically Signed   By: Zetta Bills  M.D.   On: 11/15/2021 12:09   DG C-Arm 1-60 Min-No Report  Result Date: 11/15/2021 Fluoroscopy was utilized by the requesting physician.  No radiographic interpretation.   US PELVIS (TRANSABDOMINAL ONLY)  Result Date: 11/14/2021 CLINICAL DATA:  Malignant ascites. EXAM: TRANSABDOMINAL ULTRASOUND OF PELVIS TECHNIQUE: Transabdominal ultrasound examination of the pelvis was performed including evaluation of the uterus, ovaries, adnexal regions, and pelvic cul-de-sac. Patient declined transvaginal imaging. COMPARISON:  CT scan of November 13, 2021. FINDINGS: Uterus Measurements: 4.2 x 3.5 x 3.0 cm = volume: 24 mL. No fibroids or other mass visualized. Endometrium Thickness: 8 mm which is within normal limits. No focal abnormality visualized. Right ovary Not visualized due to body habitus and overlying bowel gas. Left ovary Not visualized due to body habitus and overlying bowel gas. Other findings:  Small amount of ascites is noted in the pelvis. IMPRESSION: Uterus is grossly unremarkable. Ovaries are not visualized due to body habitus and overlying bowel gas. Small amount of ascites is noted. Electronically Signed   By: Marijo Conception M.D.   On: 11/14/2021 10:34   CT ABDOMEN PELVIS W WO CONTRAST  Result Date: 11/13/2021 CLINICAL DATA:  Peritoneal and omental metastatic disease and malignant ascites identified at laparotomy for cholecystectomy * Tracking Code: BO * EXAM: CT CHEST WITH CONTRAST CT ABDOMEN AND PELVIS WITH AND WITHOUT CONTRAST TECHNIQUE: Multidetector CT imaging of the chest was performed during intravenous contrast administration. Multidetector CT imaging of the abdomen and pelvis was performed following the standard protocol before and during bolus administration of intravenous contrast. RADIATION DOSE REDUCTION: This exam was performed according to the departmental dose-optimization program which includes automated exposure control, adjustment of the mA and/or kV according to patient size and/or  use of iterative reconstruction technique. CONTRAST:  126m OMNIPAQUE IOHEXOL 300 MG/ML  SOLN COMPARISON:  None Available. FINDINGS: CT CHEST FINDINGS Cardiovascular: Scattered aortic atherosclerosis. Normal heart size. No pericardial effusion. Mediastinum/Nodes: No enlarged mediastinal, hilar, or axillary lymph nodes. Thyroid gland, trachea, and esophagus demonstrate no significant findings. Lungs/Pleura: Small left pleural effusion associated atelectasis or consolidation. Scattered areas of alveolar ground-glass airspace opacity throughout the lungs (series 14, image 50). No pleural effusion or pneumothorax. Musculoskeletal: No chest wall mass or suspicious osseous lesions identified. Chronic, callused fractures of the posterior left ribs. CT ABDOMEN PELVIS FINDINGS Hepatobiliary: No solid liver abnormality is seen. Multiple gallstones. No gallbladder wall thickening, or biliary dilatation. Pancreas: Unremarkable. No pancreatic ductal dilatation or surrounding inflammatory changes. Spleen: Normal in size without significant abnormality. Adrenals/Urinary Tract: Adrenal glands are unremarkable. Small nonobstructive calculus of the inferior pole of the right kidney (series 5, image 62). Left kidney is normal. No mass or suspicious contrast enhancement. Bladder is unremarkable. Stomach/Bowel: Stomach is within normal limits. Appendix is not clearly visualized. No evidence of bowel wall thickening, distention, or inflammatory changes. Colonic diverticulosis. Vascular/Lymphatic: Aortic atherosclerosis. Left common iliac vein stent. Enlarged left and right cardiophrenic angle lymph nodes, measuring up to 1.5 x 1.1 cm (series 12, image 67). Reproductive: No mass or other abnormality. Other: Right upper quadrant and umbilical laparoscopy ports with subcutaneous fat stranding and small volume of subcutaneous air and fluid. Trace ascites throughout the abdomen and pelvis, with extensive peritoneal thickening, nodularity, and  omental caking (series 12, image 41). Minimal postoperative pneumoperitoneum. Musculoskeletal: No acute osseous findings. IMPRESSION: 1. Trace ascites throughout the abdomen and pelvis, with extensive peritoneal thickening, nodularity, and omental caking. Findings are consistent with widespread peritoneal and omental metastatic disease. 2. No evidence of primary malignancy in the chest, abdomen, or pelvis, with specific attention to the most likely etiologies of peritoneal metastatic disease including pancreatic, ovarian, and endometrial malignancy. Although there is no obvious mass, pelvic ultrasound may be helpful to more closely assess the female reproductive organs. 3. Small left pleural effusion and associated atelectasis or consolidation, nonspecific although worrisome for malignant effusion. No overt pleural thickening or nodularity. 4. Scattered areas of nonspecific infectious or inflammatory alveolar ground-glass airspace opacity throughout the lungs. 5. Cholelithiasis without evidence of acute cholecystitis. 6. Nonobstructive right nephrolithiasis. 7. Postoperative findings of same day laparoscopy including minimal pneumoperitoneum. Aortic Atherosclerosis (ICD10-I70.0). Electronically Signed   By: ADelanna AhmadiM.D.   On: 11/13/2021 17:19   CT CHEST W CONTRAST  Result Date: 11/13/2021 CLINICAL DATA:  Peritoneal and omental metastatic disease and malignant ascites identified at laparotomy for cholecystectomy * Tracking Code: BO * EXAM: CT CHEST WITH CONTRAST CT ABDOMEN AND PELVIS WITH AND WITHOUT CONTRAST TECHNIQUE: Multidetector CT imaging of the chest was performed during intravenous contrast administration. Multidetector CT imaging of the abdomen and pelvis was performed following the standard protocol before and during bolus administration of intravenous contrast. RADIATION DOSE REDUCTION: This exam was performed according to the departmental dose-optimization program which includes automated  exposure control, adjustment of the mA and/or kV according to patient size and/or use of iterative reconstruction technique. CONTRAST:  1061mOMNIPAQUE IOHEXOL 300 MG/ML  SOLN COMPARISON:  None Available. FINDINGS: CT CHEST FINDINGS Cardiovascular: Scattered aortic atherosclerosis. Normal heart size. No pericardial effusion. Mediastinum/Nodes: No enlarged mediastinal, hilar, or axillary lymph nodes. Thyroid gland, trachea, and esophagus demonstrate no significant findings. Lungs/Pleura: Small left pleural effusion associated atelectasis or consolidation. Scattered areas of alveolar ground-glass airspace opacity throughout the lungs (series 14, image 50). No pleural effusion  or pneumothorax. Musculoskeletal: No chest wall mass or suspicious osseous lesions identified. Chronic, callused fractures of the posterior left ribs. CT ABDOMEN PELVIS FINDINGS Hepatobiliary: No solid liver abnormality is seen. Multiple gallstones. No gallbladder wall thickening, or biliary dilatation. Pancreas: Unremarkable. No pancreatic ductal dilatation or surrounding inflammatory changes. Spleen: Normal in size without significant abnormality. Adrenals/Urinary Tract: Adrenal glands are unremarkable. Small nonobstructive calculus of the inferior pole of the right kidney (series 5, image 62). Left kidney is normal. No mass or suspicious contrast enhancement. Bladder is unremarkable. Stomach/Bowel: Stomach is within normal limits. Appendix is not clearly visualized. No evidence of bowel wall thickening, distention, or inflammatory changes. Colonic diverticulosis. Vascular/Lymphatic: Aortic atherosclerosis. Left common iliac vein stent. Enlarged left and right cardiophrenic angle lymph nodes, measuring up to 1.5 x 1.1 cm (series 12, image 67). Reproductive: No mass or other abnormality. Other: Right upper quadrant and umbilical laparoscopy ports with subcutaneous fat stranding and small volume of subcutaneous air and fluid. Trace ascites  throughout the abdomen and pelvis, with extensive peritoneal thickening, nodularity, and omental caking (series 12, image 41). Minimal postoperative pneumoperitoneum. Musculoskeletal: No acute osseous findings. IMPRESSION: 1. Trace ascites throughout the abdomen and pelvis, with extensive peritoneal thickening, nodularity, and omental caking. Findings are consistent with widespread peritoneal and omental metastatic disease. 2. No evidence of primary malignancy in the chest, abdomen, or pelvis, with specific attention to the most likely etiologies of peritoneal metastatic disease including pancreatic, ovarian, and endometrial malignancy. Although there is no obvious mass, pelvic ultrasound may be helpful to more closely assess the female reproductive organs. 3. Small left pleural effusion and associated atelectasis or consolidation, nonspecific although worrisome for malignant effusion. No overt pleural thickening or nodularity. 4. Scattered areas of nonspecific infectious or inflammatory alveolar ground-glass airspace opacity throughout the lungs. 5. Cholelithiasis without evidence of acute cholecystitis. 6. Nonobstructive right nephrolithiasis. 7. Postoperative findings of same day laparoscopy including minimal pneumoperitoneum. Aortic Atherosclerosis (ICD10-I70.0). Electronically Signed   By: Delanna Ahmadi M.D.   On: 11/13/2021 17:19   US Abdomen Complete  Result Date: 11/08/2021 CLINICAL DATA:  Right upper quadrant abdominal pain EXAM: ABDOMEN ULTRASOUND COMPLETE COMPARISON:  None Available. FINDINGS: Gallbladder: Gallstones filling the gallbladder cause a wall echo shadow appearance. Gallbladder wall thickened at 0.4 cm. Sonographic Murphy's sign documented as present by sonographer. Common bile duct: Diameter: 0.3 cm Liver: No focal lesion identified. Within normal limits in parenchymal echogenicity. Portal vein is patent on color Doppler imaging with normal direction of blood flow towards the liver. IVC: No  abnormality visualized. Pancreas: Poorly seen due to bowel gas. Spleen: Size and appearance within normal limits. Right Kidney: Length: 9.2 cm. Echogenicity within normal limits. No mass or hydronephrosis visualized. Left Kidney: Length: 10.0 cm. Echogenicity within normal limits. No solid mass or hydronephrosis visualized. 2.0 by 1.8 by 1.8 cm hypoechoic lesion with mildly accentuated through transmission performed. Image 169 of series 1 -1 leads in question whether there is any internal Doppler signal in this lesion. Abdominal aorta: No aneurysm visualized. Other findings: Limitations include body habitus and bowel gas. Perihepatic and perisplenic ascites. IMPRESSION: 1. Cholelithiasis with gallbladder wall thickening and sonographic Murphy sign. Appearance raises suspicion for potential cholelithiasis, correlate clinically. 2. Upper abdominal ascites. 3. 2.0 cm in diameter hypoechoic lesion in the left kidney is technically nonspecific because 1 image shows a question of internal Doppler signal. Otherwise this appears typical for a cyst. Dedicated renal protocol CT or MRI with and without contrast is recommended for further characterization in  the nonacute setting. Electronically Signed   By: Van Clines M.D.   On: 11/08/2021 10:35   DG Abd 1 View  Result Date: 11/06/2021 CLINICAL DATA:  Right lower quadrant abdominal pain EXAM: ABDOMEN - 1 VIEW COMPARISON:  None Available. FINDINGS: Normal bowel-gas pattern. Mild degenerative changes of the lower lumbar spine. Clustered calcifications with lucent center, likely due to gallstones. Vascular stent overlying the lower spine and right sacrum. Acute osseous abnormality. IMPRESSION: 1. Normal bowel-gas pattern. 2. Gallstones. Electronically Signed   By: Yetta Glassman M.D.   On: 11/06/2021 13:51    ASSESSMENT:  High-grade serous carcinoma of peritoneum: - She had developed sharp left-sided abdominal pain since Mother's Day, occasional radiating to the  back.  40 pound weight loss in the last 6 months. - Personal history of cervical cancer in 2000, status post chemo plus XRT at [facility].  She has slightly decreased hearing in the left ear since then. - US abdomen (11/08/2021): Cholelithiasis with gallbladder wall thickening and sonographic Murphy sign.  Upper abdominal ascites. - Laparoscopy (11/13/2021): Starting of the omentum as well as the upper abdominal wall and diaphragm.  Liver was within normal limits.  6 L of ascites was evacuated.  Omental and peritoneal biopsies were sent.  Peritoneal implants throughout and a significant portion of omentum and on the small intestine. - CT CAP (11/13/2021): Ascites, extensive peritoneal thickening, nodularity and omental caking.  No evidence of primary malignancy in the chest, abdomen or pelvis.  Small left pleural effusion, nonspecific.  Cholelithiasis. - US pelvis (11/14/2021): Uterus is grossly unremarkable.  Ovaries not visualized. - CA125 (11/13/2021): 1269 - Pathology: Peritoneum and omental biopsy consistent with high-grade serous carcinoma   Social/family history: - Lives at home with her husband.  Worked in Scientist, research (medical) at [employer].  Non-smoker. - Maternal grandmother with bladder cancer.  Maternal great grandmother with breast cancer.   PLAN:  High-grade serous carcinoma of peritoneum: - We discussed the and reviewed images of her scans. - We discussed pathology report in detail. - Recommend evaluation by Dr. Berline Lopes (GYN oncology). - Discussed the role of neoadjuvant platinum based chemotherapy, with interim staging after 3 cycles with possible debulking surgery at that time. - Talked about chemotherapy with carboplatin and paclitaxel delivered every 21 days.  She already has port placed. - Recommend somatic and germline mutation testing.   All questions were answered. The patient knows to call the clinic with any problems, questions or concerns.   Derek Jack, MD, 11/17/21 1:00 PM   [place] [phone]   I, Thana Ates, am acting as a scribe for Dr. Derek Jack.  I, Derek Jack MD, have reviewed the above documentation for accuracy and completeness, and I agree with the above.

## 2021-11-17 NOTE — Telephone Encounter (Signed)
Transition Care Management Unsuccessful Follow-up Telephone Call  Date of discharge and from where:  11/15/21 [facility]  Attempts:  1st Attempt  Reason for unsuccessful TCM follow-up call:  Unable to reach patient; 2 attempts were made, call dropped both times after female answered phone asking RNCM to hold for patient  Kelli Churn RN, CCM, [place] Management Coordinator  [phone]

## 2021-11-17 NOTE — Anesthesia Postprocedure Evaluation (Signed)
Anesthesia Post Note  Patient: Nicole Santos  Procedure(s) Performed: INSERTION PORT-A-CATH (Left: Chest)  Patient location during evaluation: Phase II Anesthesia Type: General Level of consciousness: awake Pain management: pain level controlled Vital Signs Assessment: post-procedure vital signs reviewed and stable Respiratory status: spontaneous breathing and respiratory function stable Cardiovascular status: blood pressure returned to baseline and stable Postop Assessment: no headache and no apparent nausea or vomiting Anesthetic complications: no Comments: Late entry   No notable events documented.   Last Vitals:  Vitals:   11/15/21 1230 11/15/21 1257  BP: 107/70 120/69  Pulse: 90 85  Resp: (!) 22 (!) 22  Temp:  36.5 C  SpO2: 97% 99%    Last Pain:  Vitals:   11/15/21 1535  TempSrc:   PainSc: [place]

## 2021-11-17 NOTE — Patient Instructions (Addendum)
[facility] Discharge Instructions  You were seen and examined today by Dr. Delton Coombes. Dr. Delton Coombes is a medical oncologist, meaning that he specializes in the treatment of cancer diagnoses. Dr. Delton Coombes discussed your past medical history, family history of cancers, and the events that led to you being here today.  You were referred to Dr. Delton Coombes following your most recent hospitalization. Dr. Delton Coombes discussed your most recent CT scans and biopsy results.  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 This is a common type of cancer. Your CT scans revealed thickening of the lining of your abdomen as well as fluid accumulation. There was also fluid accumulation around the bottom of your left lung. This is most likely related to the cancer.   There was no clear mass identified, so it could be related to your history of cervical cancer, ovarian cancer or is arising in the lining of the abdomen itself. You had a lab test known as CA125. This is typically elevated in the presence of ovarian cancer. This was elevated. It can be indicative but it is not diagnostic. This will be checked on a regular basis to see how your cancer is responding.  You should also meet with one of the GYN Oncologist at [facility].  Dr. Delton Coombes also discussed chemotherapy, known as Carboplatin and Paclitaxel. This is the standard treatment for your type of cancer. This is given once every 3 weeks in the [facility].  The most common side effects associated with chemotherapy is fatigue, nausea, and hair loss. With repeated exposure to these medications as well as uncontrolled diabetes, neuropathy can occur.  Dr. Delton Coombes also discussed genetic counseling. Due to your history and your family history, you will be referred to Genetic Counseling to see if there is any genetic predisposition to you developing  cancer.  Follow-up as scheduled.   Thank you for choosing [place] at [facility] to provide your oncology and hematology care.  To afford each patient quality time with our provider, please arrive at least 15 minutes before your scheduled appointment time.   If you have a lab appointment with the [facility] please come in thru the Main Entrance and check in at the main information desk.  You need to re-schedule your appointment should you arrive 10 or more minutes late.  We strive to give you quality time with our providers, and arriving late affects you and other patients whose appointments are after yours.  Also, if you no show three or more times for appointments you may be dismissed from the clinic at the providers discretion.     Again, thank you for choosing [facility].  Our hope is that these requests will decrease the amount of time that you wait before being seen by our physicians.       _____________________________________________________________  Should you have questions after your visit to [facility], please contact our office at [phone] and follow the prompts.  Our office hours are 8:00 a.m. and 4:30 p.m. Monday - Friday.  Please note that voicemails left after 4:00 p.m. may not be returned until the following business day.  We are closed weekends and major holidays.  You do have access to a nurse 24-7, just call the main number to the clinic [phone] and do not press any options, hold on the line and a nurse will answer the phone.  For prescription refill requests, have your pharmacy contact our office and allow 72 hours.    Due to Covid, you will need to wear a mask upon entering the hospital. If you do not have a mask, a mask will be given to you at the Main Entrance upon arrival. For doctor visits, patients may have 1 support person age 18 or older with them. For treatment visits, patients can not have anyone  with them due to social distancing guidelines and our immunocompromised population.

## 2021-11-18 DIAGNOSIS — Z8052 Family history of malignant neoplasm of bladder: Secondary | ICD-10-CM | POA: Diagnosis not present

## 2021-11-18 DIAGNOSIS — Z5189 Encounter for other specified aftercare: Secondary | ICD-10-CM | POA: Diagnosis not present

## 2021-11-18 DIAGNOSIS — C482 Malignant neoplasm of peritoneum, unspecified: Secondary | ICD-10-CM | POA: Diagnosis present

## 2021-11-18 DIAGNOSIS — Z8541 Personal history of malignant neoplasm of cervix uteri: Secondary | ICD-10-CM | POA: Diagnosis not present

## 2021-11-18 DIAGNOSIS — Z803 Family history of malignant neoplasm of breast: Secondary | ICD-10-CM | POA: Diagnosis not present

## 2021-11-18 DIAGNOSIS — Z5111 Encounter for antineoplastic chemotherapy: Secondary | ICD-10-CM | POA: Diagnosis present

## 2021-11-19 ENCOUNTER — Encounter: Payer: Self-pay | Admitting: Family Medicine

## 2021-11-20 ENCOUNTER — Inpatient Hospital Stay (HOSPITAL_COMMUNITY): Payer: Commercial Managed Care - PPO | Admitting: Hematology

## 2021-11-20 ENCOUNTER — Telehealth: Payer: Self-pay | Admitting: *Deleted

## 2021-11-20 ENCOUNTER — Other Ambulatory Visit (HOSPITAL_COMMUNITY): Payer: Self-pay

## 2021-11-20 DIAGNOSIS — C482 Malignant neoplasm of peritoneum, unspecified: Secondary | ICD-10-CM

## 2021-11-20 MED ORDER — PROCHLORPERAZINE MALEATE 10 MG PO TABS: 10.0000 mg | ORAL_TABLET | Freq: Four times a day (QID) | ORAL | 1 refills | Status: DC | PRN

## 2021-11-20 MED ORDER — LIDOCAINE-PRILOCAINE 2.5-2.5 % EX CREA: TOPICAL_CREAM | CUTANEOUS | 3 refills | Status: DC

## 2021-11-20 NOTE — Progress Notes (Signed)
Pharmacist Chemotherapy Monitoring - Initial Assessment    Anticipated start date: 11/21/21   The following has been reviewed per standard work regarding the patient's treatment regimen: The patient's diagnosis, treatment plan and drug doses, and organ/hematologic function Lab orders and baseline tests specific to treatment regimen  The treatment plan start date, drug sequencing, and pre-medications Prior authorization status  Patient's documented medication list, including drug-drug interaction screen and prescriptions for anti-emetics and supportive care specific to the treatment regimen The drug concentrations, fluid compatibility, administration routes, and timing of the medications to be used The patient's access for treatment and lifetime cumulative dose history, if applicable  The patient's medication allergies and previous infusion related reactions, if applicable   Changes made to treatment plan:  switch to insurance preferred biosimilar  Follow up needed:  N/A   [name], [facility], 11/20/2021  2:57 PM

## 2021-11-20 NOTE — Telephone Encounter (Signed)
Attempted to reach the patient to schedule a new patient appt with Dr Berline Lopes. No answer and no voicemail. Patient needs an appt the week of July 10th

## 2021-11-20 NOTE — Progress Notes (Signed)
Orders received to add pegfilgrastim 6 mg SQ to day 3 of treatment plan.  Plan updated.  T.O. Dr Rhys Martini, PharmD

## 2021-11-20 NOTE — Patient Instructions (Signed)
[place] will see the doctor regularly throughout treatment.  We will obtain blood work from you prior to every treatment and monitor your results to make sure it is safe to give your treatment. The doctor monitors your response to treatment by the way you are feeling, your blood work, and by obtaining scans periodically.  There will be wait times while you are here for treatment.  It will take about 30 minutes to 1 hour for your lab work to result.  Then there will be wait times while pharmacy mixes your medications.   Medications you will receive in the clinic prior to your chemotherapy medications:   Aloxi:  ALOXI is used in adults to help prevent the nausea and vomiting that happens with certain chemotherapy drugs.  Aloxi is a long acting medication, and will remain in your system for about 2 days.    Dexamethasone:  This is a steroid given prior to chemotherapy to help prevent allergic reactions; it may also help prevent and control nausea and diarrhea.    Pepcid:  This medication is a histamine blocker that helps prevent and allergic reaction to your chemotherapy.    Benadryl:  This is a histamine blocker (different from the Pepcid) that helps prevent allergic/infusion reactions to your chemotherapy. This medication may cause dizziness/drowsiness.     Paclitaxel (Taxol)  About This Drug Paclitaxel is a drug used to treat cancer. It is given in the vein (IV).  This will take 3 hours to infuse.  This first infusion will take longer to infuse because it is increased slowly to monitor for reactions.  The nurse will be in the room with you for the first 15 minutes of the first infusion.  Possible Side Effects   Hair loss. Hair loss is often temporary, although with certain medicine, hair loss can sometimes be permanent. Hair loss may happen suddenly or gradually. If you lose hair, you may lose it from your head, face, armpits, pubic area, chest,  and/or legs. You may also notice your hair getting thin.   Swelling of your legs, ankles and/or feet (edema)   Flushing   Nausea and throwing up (vomiting)   Loose bowel movements (diarrhea)   Bone marrow depression. This is a decrease in the number of white blood cells, red blood cells, and platelets. This may raise your risk of infection, make you tired and weak (fatigue), and raise your risk of bleeding.   Effects on the nerves are called peripheral neuropathy. You may feel numbness, tingling, or pain in your hands and feet. It may be hard for you to button your clothes, open jars, or walk as usual. The effect on the nerves may get worse with more doses of the drug. These effects get better in some people after the drug is stopped but it does not get better in all people.   Changes in your liver function   Bone, joint and muscle pain   Abnormal EKG   Allergic reaction: Allergic reactions, including anaphylaxis are rare but may happen in some patients. Signs of allergic reaction to this drug may be swelling of the face, feeling like your tongue or throat are swelling, trouble breathing, rash, itching, fever, chills, feeling dizzy, and/or feeling that your heart is beating in a fast or not normal way. If this happens, do not take another dose of this drug. You should get urgent medical treatment.   Infection   Changes in your kidney  function.  Note: Each of the side effects above was reported in 20% or greater of patients treated with paclitaxel. Not all possible side effects are included above.  Warnings and Precautions   Severe allergic reactions   Severe bone marrow depression  Treating Side Effects   To help with hair loss, wash with a mild shampoo and avoid washing your hair every day.   Avoid rubbing your scalp, instead, pat your hair or scalp dry   Avoid coloring your hair   Limit your use of hair spray, electric curlers, blow dryers, and curling irons.   If you  are interested in getting a wig, talk to your nurse. You can also call the [facility] at 800-ACS-2345 to find out information about the "Look Good, Feel Better" program close to where you live. It is a free program where women getting chemotherapy can learn about wigs, turbans and scarves as well as makeup techniques and skin and nail care.   Ask your doctor or nurse about medicines that are available to help stop or lessen diarrhea and/or nausea.   To help with nausea and vomiting, eat small, frequent meals instead of three large meals a day. Choose foods and drinks that are at room temperature. Ask your nurse or doctor about other helpful tips and medicine that is available to help or stop lessen these symptoms.   If you get diarrhea, eat low-fiber foods that are high in protein and calories and avoid foods that can irritate your digestive tracts or lead to cramping. Ask your nurse or doctor about medicine that can lessen or stop your diarrhea.   Mouth care is very important. Your mouth care should consist of routine, gentle cleaning of your teeth or dentures and rinsing your mouth with a mixture of 1/2 teaspoon of salt in 8 ounces of water or  teaspoon of baking soda in 8 ounces of water. This should be done at least after each meal and at bedtime.   If you have mouth sores, avoid mouthwash that has alcohol. Also avoid alcohol and smoking because they can bother your mouth and throat.   Drink plenty of fluids (a minimum of eight glasses per day is recommended).   Take your temperature as your doctor or nurse tells you, and whenever you feel like you may have a fever.   Talk to your doctor or nurse about precautions you can take to avoid infections and bleeding.   Be careful when cooking, walking, and handling sharp objects and hot liquids.  Food and Drug Interactions   There are no known interactions of paclitaxel with food.   This drug may interact with other medicines.  Tell your doctor and pharmacist about all the medicines and dietary supplements (vitamins, minerals, herbs and others) that you are taking at this time.   The safety and use of dietary supplements and alternative diets are often not known. Using these might affect your cancer or interfere with your treatment. Until more is known, you should not use dietary supplements or alternative diets without your cancer doctor's help.  When to Call the Doctor  Call your doctor or nurse if you have any of the following symptoms and/or any new or unusual symptoms:   Fever of 100.4 F (38 C) or above   Chills   Redness, pain, warmth, or swelling at the IV site during the infusion   Signs of allergic reaction: swelling of the face, feeling like your tongue or throat are swelling,  trouble breathing, rash, itching, fever, chills, feeling dizzy, and/or feeling that your heart is beating in a fast or not normal way   Feeling that your heart is beating in a fast or not normal way (palpitations)   Weight gain of 5 pounds in one week (fluid retention)   Decreased urine or very dark urine   Signs of liver problems: dark urine, pale bowel movements, bad stomach pain, feeling very tired and weak, unusual  itching, or yellowing of the eyes or skin   Heavy menstrual period that lasts longer than normal   Easy bruising or bleeding   Nausea that stops you from eating or drinking, and/or that is not relieved by prescribed medicines.   Loose bowel movements (diarrhea) more than 4 times a day or diarrhea with weakness or lightheadedness   Pain in your mouth or throat that makes it hard to eat or drink   Lasting loss of appetite or rapid weight loss of five pounds in a week   Signs of peripheral neuropathy: numbness, tingling, or decreased feeling in fingers or toes; trouble walking or changes in the way you walk; or feeling clumsy when buttoning clothes, opening jars, or other routine activities   Joint and  muscle pain that is not relieved by prescribed medicines   Extreme fatigue that interferes with normal activities   While you are getting this drug, please tell your nurse right away if you have any pain, redness, or swelling at the site of the IV infusion.   If you think you are pregnant.  Reproduction Warnings   Pregnancy warning: This drug may have harmful effects on the unborn child, it is recommended that effective methods of birth control should be used during your cancer treatment. Let your doctor know right away if you think you may be pregnant.   Breast feeding warning: Women should not breast feed during treatment because this drug could enter the breastmilk and cause harm to a breast feeding baby.   Carboplatin (Paraplatin, CBDCA)  About This Drug  Carboplatin is used to treat cancer. It is given in the vein (IV).  It will take 30 minutes to infuse.   Possible Side Effects   Bone marrow suppression. This is a decrease in the number of white blood cells, red blood cells, and platelets. This may raise your risk of infection, make you tired and weak (fatigue), and raise your risk of bleeding.   Nausea and vomiting (throwing up)   Weakness   Changes in your liver function   Changes in your kidney function   Electrolyte changes   Pain  Note: Each of the side effects above was reported in 20% or greater of patients treated with carboplatin. Not all possible side effects are included above.   Warnings and Precautions   Severe bone marrow suppression   Allergic reactions, including anaphylaxis are rare but may happen in some patients. Signs of allergic reaction to this drug may be swelling of the face, feeling like your tongue or throat are swelling, trouble breathing, rash, itching, fever, chills, feeling dizzy, and/or feeling that your heart is beating in a fast or not normal way. If this happens, do not take another dose of this drug. You should get urgent medical  treatment.   Severe nausea and vomiting   Effects on the nerves are called peripheral neuropathy. This risk is increased if you are over the age of 57 or if you have received other medicine with risk of peripheral neuropathy.  You may feel numbness, tingling, or pain in your hands and feet. It may be hard for you to button your clothes, open jars, or walk as usual. The effect on the nerves may get worse with more doses of the drug. These effects get better in some people after the drug is stopped but it does not get better in all people.   Blurred vision, loss of vision or other changes in eyesight   Decreased hearing   - Skin and tissue irritation including redness, pain, warmth, or swelling at the IV site if the drug leaks out of the vein and into nearby tissue.   Severe changes in your kidney function, which can cause kidney failure   Severe changes in your liver function, which can cause liver failure  Note: Some of the side effects above are very rare. If you have concerns and/or questions, please discuss them with your medical team.   Important Information   This drug may be present in the saliva, tears, sweat, urine, stool, vomit, semen, and vaginal secretions. Talk to your doctor and/or your nurse about the necessary precautions to take during this time.   Treating Side Effects   Manage tiredness by pacing your activities for the day.   Be sure to include periods of rest between energy-draining activities.   To decrease the risk of infection, wash your hands regularly.   Avoid close contact with people who have a cold, the flu, or other infections.   Take your temperature as your doctor or nurse tells you, and whenever you feel like you may have a fever.   To help decrease the risk of bleeding, use a soft toothbrush. Check with your nurse before using dental floss.   Be very careful when using knives or tools.   Use an electric shaver instead of a razor.   Drink  plenty of fluids (a minimum of eight glasses per day is recommended).   If you throw up or have loose bowel movements, you should drink more fluids so that you do not become dehydrated (lack of water in the body from losing too much fluid).   To help with nausea and vomiting, eat small, frequent meals instead of three large meals a day. Choose foods and drinks that are at room temperature. Ask your nurse or doctor about other helpful tips and medicine that is available to help stop or lessen these symptoms.   If you have numbness and tingling in your hands and feet, be careful when cooking, walking, and handling sharp objects and hot liquids.   Keeping your pain under control is important to your well-being. Please tell your doctor or nurse if you are experiencing pain.   Food and Drug Interactions   There are no known interactions of carboplatin with food.   This drug may interact with other medicines. Tell your doctor and pharmacist about all the prescription and over-the-counter medicines and dietary supplements (vitamins, minerals, herbs and others) that you are taking at this time. Also, check with your doctor or pharmacist before starting any new prescription or over-the-counter medicines, or dietary supplements to make sure that there are no interactions.   When to Call the Doctor  Call your doctor or nurse if you have any of these symptoms and/or any new or unusual symptoms:   Fever of 100.4 F (38 C) or higher   Chills   Tiredness that interferes with your daily activities   Feeling dizzy or lightheaded   Easy bleeding  or bruising   Nausea that stops you from eating or drinking and/or is not relieved by prescribed medicines   Throwing up   Blurred vision or other changes in eyesight   Decrease in hearing or ringing in the ear   Signs of allergic reaction: swelling of the face, feeling like your tongue or throat are swelling, trouble breathing, rash, itching, fever,  chills, feeling dizzy, and/or feeling that your heart is beating in a fast or not normal way. If this happens, call 911 for emergency care.   Signs of possible liver problems: dark urine, pale bowel movements, bad stomach pain, feeling very tired and weak, unusual itching, or yellowing of the eyes or skin   Decreased urine, or very dark urine   Numbness, tingling, or pain in your hands and feet   Pain that does not go away or is not relieved by prescribed medicine   While you are getting this drug, please tell your nurse right away if you have any pain, redness, or swelling at the site of the IV infusion, or if you have any new onset of symptoms, or if you just feel "different" from before when the infusion was started.   Reproduction Warnings   Pregnancy warning: This drug may have harmful effects on the unborn baby. Women of child bearing potential should use effective methods of birth control during your cancer treatment. Let your doctor know right away if you think you may be pregnant.   Breastfeeding warning: It is not known if this drug passes into breast milk. For this reason, women should not breastfeed during treatment because this drug could enter the breast milk and cause harm to a breastfeeding baby.   Fertility warning: Human fertility studies have not been done with this drug. Talk with your doctor or nurse if you plan to have children. Ask for information on sperm or egg banking.   SELF CARE ACTIVITIES WHILE RECEIVING CHEMOTHERAPY:  Hydration Increase your fluid intake 48 hours prior to treatment and drink at least 8 to 12 cups (64 ounces) of water/decaffeinated beverages per day after treatment. You can still have your cup of coffee or soda but these beverages do not count as part of your 8 to 12 cups that you need to drink daily. No alcohol intake.  Medications Continue taking your normal prescription medication as prescribed.  If you start any new herbal or new  supplements please let us know first to make sure it is safe.  Mouth Care Have teeth cleaned professionally before starting treatment. Keep dentures and partial plates clean. Use soft toothbrush and do not use mouthwashes that contain alcohol. Biotene is a good mouthwash that is available at most pharmacies or may be ordered by calling [phone]. Use warm salt water gargles (1 teaspoon salt per 1 quart warm water) before and after meals and at bedtime. If you need dental work, please let the doctor know before you go for your appointment so that we can coordinate the best possible time for you in regards to your chemo regimen. You need to also let your dentist know that you are actively taking chemo. We may need to do labs prior to your dental appointment.  Skin Care Always use sunscreen that has not expired and with SPF (Sun Protection Factor) of 50 or higher. Wear hats to protect your head from the sun. Remember to use sunscreen on your hands, ears, face, & feet.  Use good moisturizing lotions such as udder cream, eucerin,  or even Vaseline. Some chemotherapies can cause dry skin, color changes in your skin and nails.    Avoid long, hot showers or baths. Use gentle, fragrance-free soaps and laundry detergent. Use moisturizers, preferably creams or ointments rather than lotions because the thicker consistency is better at preventing skin dehydration. Apply the cream or ointment within 15 minutes of showering. Reapply moisturizer at night, and moisturize your hands every time after you wash them.  Hair Loss (if your doctor says your hair will fall out)  If your doctor says that your hair is likely to fall out, decide before you begin chemo whether you want to wear a wig. You may want to shop before treatment to match your hair color. Hats, turbans, and scarves can also camouflage hair loss, although some people prefer to leave their heads uncovered. If you go bare-headed outdoors, be sure to use  sunscreen on your scalp. Cut your hair short. It eases the inconvenience of shedding lots of hair, but it also can reduce the emotional impact of watching your hair fall out. Don't perm or color your hair during chemotherapy. Those chemical treatments are already damaging to hair and can enhance hair loss. Once your chemo treatments are done and your hair has grown back, it's OK to resume dyeing or perming hair.  With chemotherapy, hair loss is almost always temporary. But when it grows back, it may be a different color or texture. In older adults who still had hair color before chemotherapy, the new growth may be completely gray.  Often, new hair is very fine and soft.  Infection Prevention Please wash your hands for at least 30 seconds using warm soapy water. Handwashing is the #1 way to prevent the spread of germs. Stay away from sick people or people who are getting over a cold. If you develop respiratory systems such as green/yellow mucus production or productive cough or persistent cough let us know and we will see if you need an antibiotic. It is a good idea to keep a pair of gloves on when going into grocery stores/[employer] to decrease your risk of coming into contact with germs on the carts, etc. Carry alcohol hand gel with you at all times and use it frequently if out in public. If your temperature reaches 100.4 or higher please call the clinic and let us know.  If it is after hours or on the weekend please go to the ER if your temperature is over 100.4.  Please have your own personal thermometer at home to use.    Sex and bodily fluids If you are going to have sex, a condom must be used to protect the person that isn't taking chemotherapy. Chemo can decrease your libido (sex drive). For a few days after chemotherapy, chemotherapy can be excreted through your bodily fluids.  When using the toilet please close the lid and flush the toilet twice.  Do this for a few day after you have had  chemotherapy.   Effects of chemotherapy on your sex life Some changes are simple and won't last long. They won't affect your sex life permanently.  Sometimes you may feel: too tired not strong enough to be very active sick or sore  not in the mood anxious or low  Your anxiety might not seem related to sex. For example, you may be worried about the cancer and how your treatment is going. Or you may be worried about money, or about how you family are coping with your illness.  These things can cause stress, which can affect your interest in sex. It's important to talk to your partner about how you feel.  Remember - the changes to your sex life don't usually last long. There's usually no medical reason to stop having sex during chemo. The drugs won't have any long term physical effects on your performance or enjoyment of sex. Cancer can't be passed on to your partner during sex  Contraception It's important to use reliable contraception during treatment. Avoid getting pregnant while you or your partner are having chemotherapy. This is because the drugs may harm the baby. Sometimes chemotherapy drugs can leave a man or woman infertile.  This means you would not be able to have children in the future. You might want to talk to someone about permanent infertility. It can be very difficult to learn that you may no longer be able to have children. Some people find counselling helpful. There might be ways to preserve your fertility, although this is easier for men than for women. You may want to speak to a fertility expert. You can talk about sperm banking or harvesting your eggs. You can also ask about other fertility options, such as donor eggs. If you have or have had breast cancer, your doctor might advise you not to take the contraceptive pill. This is because the hormones in it might affect the cancer. It is not known for sure whether or not chemotherapy drugs can be passed on through semen or secretions  from the vagina. Because of this some doctors advise people to use a barrier method if you have sex during treatment. This applies to vaginal, anal or oral sex. Generally, doctors advise a barrier method only for the time you are actually having the treatment and for about a week after your treatment. Advice like this can be worrying, but this does not mean that you have to avoid being intimate with your partner. You can still have close contact with your partner and continue to enjoy sex.  Animals If you have cats or birds we just ask that you not change the litter or change the cage.  Please have someone else do this for you while you are on chemotherapy.   Food Safety During and After Cancer Treatment Food safety is important for people both during and after cancer treatment. Cancer and cancer treatments, such as chemotherapy, radiation therapy, and stem cell/bone marrow transplantation, often weaken the immune system. This makes it harder for your body to protect itself from foodborne illness, also called food poisoning. Foodborne illness is caused by eating food that contains harmful bacteria, parasites, or viruses.  Foods to avoid Some foods have a higher risk of becoming tainted with bacteria. These include: Unwashed fresh fruit and vegetables, especially leafy vegetables that can hide dirt and other contaminants Raw sprouts, such as alfalfa sprouts Raw or undercooked beef, especially ground beef, or other raw or undercooked meat and poultry Fatty, fried, or spicy foods immediately before or after treatment.  These can sit heavy on your stomach and make you feel nauseous. Raw or undercooked shellfish, such as oysters. Sushi and sashimi, which often contain raw fish.  Unpasteurized beverages, such as unpasteurized fruit juices, raw milk, raw yogurt, or cider Undercooked eggs, such as soft boiled, over easy, and poached; raw, unpasteurized eggs; or foods made with raw egg, such as homemade raw  cookie dough and homemade mayonnaise  Simple steps for food safety  Shop smart. Do not buy food stored or  displayed in an unclean area. Do not buy bruised or damaged fruits or vegetables. Do not buy cans that have cracks, dents, or bulges. Pick up foods that can spoil at the end of your shopping trip and store them in a cooler on the way home.  Prepare and clean up foods carefully. Rinse all fresh fruits and vegetables under running water, and dry them with a clean towel or paper towel. Clean the top of cans before opening them. After preparing food, wash your hands for 20 seconds with hot water and soap. Pay special attention to areas between fingers and under nails. Clean your utensils and dishes with hot water and soap. Disinfect your kitchen and cutting boards using 1 teaspoon of liquid, unscented bleach mixed into 1 quart of water.    Dispose of old food. Eat canned and packaged food before its expiration date (the "use by" or "best before" date). Consume refrigerated leftovers within 3 to 4 days. After that time, throw out the food. Even if the food does not smell or look spoiled, it still may be unsafe. Some bacteria, such as Listeria, can grow even on foods stored in the refrigerator if they are kept for too long.  Take precautions when eating out. At restaurants, avoid buffets and salad bars where food sits out for a long time and comes in contact with many people. Food can become contaminated when someone with a virus, often a norovirus, or another "bug" handles it. Put any leftover food in a "to-go" container yourself, rather than having the server do it. And, refrigerate leftovers as soon as you get home. Choose restaurants that are clean and that are willing to prepare your food as you order it cooked.   AT HOME MEDICATIONS:                                                                                                                                                                 Compazine/Prochlorperazine '10mg'$  tablet. Take 1 tablet every 6 hours as needed for nausea/vomiting. (This can make you sleepy)   EMLA cream. Apply a quarter size amount to port site 1 hour prior to chemo. Do not rub in. Cover with plastic wrap.    Diarrhea Sheet   If you are having loose stools/diarrhea, please purchase Imodium and begin taking as outlined:  At the first sign of poorly formed or loose stools you should begin taking Imodium (loperamide) 2 mg capsules.  Take two tablets ('4mg'$ ) followed by one tablet ('2mg'$ ) every 2 hours - DO NOT EXCEED 8 tablets in 24 hours.  If it is bedtime and you are having loose stools, take 2 tablets at bedtime, then 2 tablets every 4 hours until morning.   Always call the [facility] if you  are having loose stools/diarrhea that you can't get under control.  Loose stools/diarrhea leads to dehydration (loss of water) in your body.  We have other options of trying to get the loose stools/diarrhea to stop but you must let us know!   Constipation Sheet  Colace - 100 mg capsules - take 2 capsules daily.  If this doesn't help then you can increase to 2 capsules twice daily.  Please call if the above does not work for you. Do not go more than 2 days without a bowel movement.  It is very important that you do not become constipated.  It will make you feel sick to your stomach (nausea) and can cause abdominal pain and vomiting.  Nausea Sheet   Compazine/Prochlorperazine '10mg'$  tablet. Take 1 tablet every 6 hours as needed for nausea/vomiting (This can make you drowsy).  If you are having persistent nausea (nausea that does not stop) please call the [facility] and let us know the amount of nausea that you are experiencing.  If you begin to vomit, you need to call the [facility] and if it is the weekend and you have vomited more than one time and can't get it to stop-go to the Emergency Room.  Persistent nausea/vomiting can lead to dehydration (loss of fluid  in your body) and will make you feel very weak and unwell. Ice chips, sips of clear liquids, foods that are at room temperature, crackers, and toast tend to be better tolerated.   SYMPTOMS TO REPORT AS SOON AS POSSIBLE AFTER TREATMENT:  FEVER GREATER THAN 100.4 F  CHILLS WITH OR WITHOUT FEVER  NAUSEA AND VOMITING THAT IS NOT CONTROLLED WITH YOUR NAUSEA MEDICATION  UNUSUAL SHORTNESS OF BREATH  UNUSUAL BRUISING OR BLEEDING  TENDERNESS IN MOUTH AND THROAT WITH OR WITHOUT PRESENCE OF ULCERS  URINARY PROBLEMS  BOWEL PROBLEMS  UNUSUAL RASH      Wear comfortable clothing and clothing appropriate for easy access to any Portacath or PICC line. Let us know if there is anything that we can do to make your therapy better!    What to do if you need assistance after hours or on the weekends: CALL [phone].  HOLD on the line, do not hang up.  You will hear multiple messages but at the end you will be connected with a nurse triage line.  They will contact the doctor if necessary.  Most of the time they will be able to assist you.  Do not call the hospital operator.      I have been informed and understand all of the instructions given to me and have received a copy. I have been instructed to call the clinic [phone] or my family physician as soon as possible for continued medical care, if indicated. I do not have any more questions at this time but understand that I may call the [facility] or the Patient Navigator at [phone] during office hours should I have questions or need assistance in obtaining follow-up care.

## 2021-11-21 ENCOUNTER — Inpatient Hospital Stay (HOSPITAL_COMMUNITY): Payer: Commercial Managed Care - PPO

## 2021-11-21 VITALS — BP 114/77 | HR 99 | Temp 97.7°F | Resp 20

## 2021-11-21 DIAGNOSIS — C482 Malignant neoplasm of peritoneum, unspecified: Secondary | ICD-10-CM

## 2021-11-21 DIAGNOSIS — Z5111 Encounter for antineoplastic chemotherapy: Secondary | ICD-10-CM | POA: Diagnosis not present

## 2021-11-21 LAB — CBC WITH DIFFERENTIAL/PLATELET
Abs Immature Granulocytes: 0.07 10*3/uL (ref 0.00–0.07)
Basophils Absolute: 0.1 10*3/uL (ref 0.0–0.1)
Basophils Relative: 1 %
Eosinophils Absolute: 0.1 10*3/uL (ref 0.0–0.5)
Eosinophils Relative: 1 %
HCT: 38.5 % (ref 36.0–46.0)
Hemoglobin: 12.6 g/dL (ref 12.0–15.0)
Immature Granulocytes: 1 %
Lymphocytes Relative: 10 %
Lymphs Abs: 0.7 10*3/uL (ref 0.7–4.0)
MCH: 28.1 pg (ref 26.0–34.0)
MCHC: 32.7 g/dL (ref 30.0–36.0)
MCV: 85.9 fL (ref 80.0–100.0)
Monocytes Absolute: 0.8 10*3/uL (ref 0.1–1.0)
Monocytes Relative: 11 %
Neutro Abs: 5.7 10*3/uL (ref 1.7–7.7)
Neutrophils Relative %: 76 %
Platelets: 481 10*3/uL — ABNORMAL HIGH (ref 150–400)
RBC: 4.48 MIL/uL (ref 3.87–5.11)
RDW: 13.7 % (ref 11.5–15.5)
WBC: 7.4 10*3/uL (ref 4.0–10.5)
nRBC: 0 % (ref 0.0–0.2)

## 2021-11-21 LAB — MAGNESIUM: Magnesium: 1.8 mg/dL (ref 1.7–2.4)

## 2021-11-21 LAB — COMPREHENSIVE METABOLIC PANEL
ALT: 11 U/L (ref 0–44)
AST: 19 U/L (ref 15–41)
Albumin: 2 g/dL — ABNORMAL LOW (ref 3.5–5.0)
Alkaline Phosphatase: 45 U/L (ref 38–126)
Anion gap: 6 (ref 5–15)
BUN: 9 mg/dL (ref 6–20)
CO2: 27 mmol/L (ref 22–32)
Calcium: 7.9 mg/dL — ABNORMAL LOW (ref 8.9–10.3)
Chloride: 103 mmol/L (ref 98–111)
Creatinine, Ser: 0.68 mg/dL (ref 0.44–1.00)
GFR, Estimated: >60 mL/min (ref >60)
Glucose, Bld: 111 mg/dL — ABNORMAL HIGH (ref 70–99)
Potassium: 3.9 mmol/L (ref 3.5–5.1)
Sodium: 136 mmol/L (ref 135–145)
Total Bilirubin: 0.7 mg/dL (ref 0.3–1.2)
Total Protein: 5.4 g/dL — ABNORMAL LOW (ref 6.5–8.1)

## 2021-11-21 LAB — GLUCOSE, CAPILLARY
Glucose-Capillary: 114 mg/dL — ABNORMAL HIGH (ref 70–99)
Glucose-Capillary: 109 mg/dL — ABNORMAL HIGH (ref 70–99)

## 2021-11-21 MED ORDER — SODIUM CHLORIDE 0.9 % IV SOLN
175.0000 mg/m2 | Freq: Once | INTRAVENOUS | Status: AC
  Administered 2021-11-21: 378 mg via INTRAVENOUS
  Filled 2021-11-21: qty 63

## 2021-11-21 MED ORDER — PALONOSETRON HCL INJECTION 0.25 MG/5ML
0.2500 mg | Freq: Once | INTRAVENOUS | Status: AC
  Administered 2021-11-21: 0.25 mg via INTRAVENOUS
  Filled 2021-11-21: qty 5

## 2021-11-21 MED ORDER — SODIUM CHLORIDE 0.9 % IV SOLN
10.0000 mg | Freq: Once | INTRAVENOUS | Status: AC
  Administered 2021-11-21: 10 mg via INTRAVENOUS
  Filled 2021-11-21: qty 10

## 2021-11-21 MED ORDER — SODIUM CHLORIDE 0.9% FLUSH
10.0000 mL | INTRAVENOUS | Status: DC | PRN
  Administered 2021-11-21: 10 mL

## 2021-11-21 MED ORDER — SODIUM CHLORIDE 0.9 % IV SOLN
150.0000 mg | Freq: Once | INTRAVENOUS | Status: AC
  Administered 2021-11-21: 150 mg via INTRAVENOUS
  Filled 2021-11-21: qty 150

## 2021-11-21 MED ORDER — DIPHENHYDRAMINE HCL 50 MG/ML IJ SOLN
50.0000 mg | Freq: Once | INTRAMUSCULAR | Status: AC
  Administered 2021-11-21: 50 mg via INTRAVENOUS
  Filled 2021-11-21: qty 1

## 2021-11-21 MED ORDER — SODIUM CHLORIDE 0.9 % IV SOLN
750.0000 mg | Freq: Once | INTRAVENOUS | Status: AC
  Administered 2021-11-21: 750 mg via INTRAVENOUS
  Filled 2021-11-21: qty 75

## 2021-11-21 MED ORDER — HEPARIN SOD (PORK) LOCK FLUSH 100 UNIT/ML IV SOLN
500.0000 [IU] | Freq: Once | INTRAVENOUS | Status: AC | PRN
  Administered 2021-11-21: 500 [IU]

## 2021-11-21 MED ORDER — SODIUM CHLORIDE 0.9 % IV SOLN: Freq: Once | INTRAVENOUS | Status: AC

## 2021-11-21 MED ORDER — FAMOTIDINE IN NACL 20-0.9 MG/50ML-% IV SOLN
20.0000 mg | Freq: Once | INTRAVENOUS | Status: AC
  Administered 2021-11-21: 20 mg via INTRAVENOUS
  Filled 2021-11-21: qty 50

## 2021-11-21 NOTE — Patient Instructions (Signed)
[facility]  Discharge Instructions: Thank you for choosing [facility] to provide your oncology and hematology care.  If you have a lab appointment with the [facility], please come in thru the Main Entrance and check in at the main information desk.  Wear comfortable clothing and clothing appropriate for easy access to any Portacath or PICC line.   We strive to give you quality time with your provider. You may need to reschedule your appointment if you arrive late (15 or more minutes).  Arriving late affects you and other patients whose appointments are after yours.  Also, if you miss three or more appointments without notifying the office, you may be dismissed from the clinic at the provider's discretion.      For prescription refill requests, have your pharmacy contact our office and allow 72 hours for refills to be completed.    Today you received the following chemotherapy and/or immunotherapy agents taxol and carboplatin.  Carboplatin injection What is this medication? CARBOPLATIN (KAR boe pla tin) is a chemotherapy drug. It targets fast dividing cells, like cancer cells, and causes these cells to die. This medicine is used to treat ovarian cancer and many other cancers. This medicine may be used for other purposes; ask your health care provider or pharmacist if you have questions. COMMON BRAND NAME(S): Paraplatin What should I tell my care team before I take this medication? They need to know if you have any of these conditions: blood disorders hearing problems kidney disease recent or ongoing radiation therapy an unusual or allergic reaction to carboplatin, cisplatin, other chemotherapy, other medicines, foods, dyes, or preservatives pregnant or trying to get pregnant breast-feeding How should I use this medication? This drug is usually given as an infusion into a vein. It is administered in a hospital or clinic by a specially trained health care  professional. Talk to your pediatrician regarding the use of this medicine in children. Special care may be needed. Overdosage: If you think you have taken too much of this medicine contact a poison control center or emergency room at once. NOTE: This medicine is only for you. Do not share this medicine with others. What if I miss a dose? It is important not to miss a dose. Call your doctor or health care professional if you are unable to keep an appointment. What may interact with this medication? medicines for seizures medicines to increase blood counts like filgrastim, pegfilgrastim, sargramostim some antibiotics like amikacin, gentamicin, neomycin, streptomycin, tobramycin vaccines Talk to your doctor or health care professional before taking any of these medicines: acetaminophen aspirin ibuprofen ketoprofen naproxen This list may not describe all possible interactions. Give your health care provider a list of all the medicines, herbs, non-prescription drugs, or dietary supplements you use. Also tell them if you smoke, drink alcohol, or use illegal drugs. Some items may interact with your medicine. What should I watch for while using this medication? Your condition will be monitored carefully while you are receiving this medicine. You will need important blood work done while you are taking this medicine. This drug may make you feel generally unwell. This is not uncommon, as chemotherapy can affect healthy cells as well as cancer cells. Report any side effects. Continue your course of treatment even though you feel ill unless your doctor tells you to stop. In some cases, you may be given additional medicines to help with side effects. Follow all directions for their use. Call your doctor or health care  professional for advice if you get a fever, chills or sore throat, or other symptoms of a cold or flu. Do not treat yourself. This drug decreases your body's ability to fight infections. Try  to avoid being around people who are sick. This medicine may increase your risk to bruise or bleed. Call your doctor or health care professional if you notice any unusual bleeding. Be careful brushing and flossing your teeth or using a toothpick because you may get an infection or bleed more easily. If you have any dental work done, tell your dentist you are receiving this medicine. Avoid taking products that contain aspirin, acetaminophen, ibuprofen, naproxen, or ketoprofen unless instructed by your doctor. These medicines may hide a fever. Do not become pregnant while taking this medicine. Women should inform their doctor if they wish to become pregnant or think they might be pregnant. There is a potential for serious side effects to an unborn child. Talk to your health care professional or pharmacist for more information. Do not breast-feed an infant while taking this medicine. What side effects may I notice from receiving this medication? Side effects that you should report to your doctor or health care professional as soon as possible: allergic reactions like skin rash, itching or hives, swelling of the face, lips, or tongue signs of infection - fever or chills, cough, sore throat, pain or difficulty passing urine signs of decreased platelets or bleeding - bruising, pinpoint red spots on the skin, black, tarry stools, nosebleeds signs of decreased red blood cells - unusually weak or tired, fainting spells, lightheadedness breathing problems changes in hearing changes in vision chest pain high blood pressure low blood counts - This drug may decrease the number of white blood cells, red blood cells and platelets. You may be at increased risk for infections and bleeding. nausea and vomiting pain, swelling, redness or irritation at the injection site pain, tingling, numbness in the hands or feet problems with balance, talking, walking trouble passing urine or change in the amount of urine Side  effects that usually do not require medical attention (report to your doctor or health care professional if they continue or are bothersome): hair loss loss of appetite metallic taste in the mouth or changes in taste This list may not describe all possible side effects. Call your doctor for medical advice about side effects. You may report side effects to FDA at 1-800-FDA-1088. Where should I keep my medication? This drug is given in a hospital or clinic and will not be stored at home. NOTE: This sheet is a summary. It may not cover all possible information. If you have questions about this medicine, talk to your doctor, pharmacist, or health care provider.  2023 Elsevier/Gold Standard (2007-10-29 00:00:00) Paclitaxel injection What is this medication? PACLITAXEL (PAK li TAX el) is a chemotherapy drug. It targets fast dividing cells, like cancer cells, and causes these cells to die. This medicine is used to treat ovarian cancer, breast cancer, lung cancer, Kaposi's sarcoma, and other cancers. This medicine may be used for other purposes; ask your health care provider or pharmacist if you have questions. COMMON BRAND NAME(S): Onxol, Taxol What should I tell my care team before I take this medication? They need to know if you have any of these conditions: history of irregular heartbeat liver disease low blood counts, like low white cell, platelet, or red cell counts lung or breathing disease, like asthma tingling of the fingers or toes, or other nerve disorder an unusual or allergic  reaction to paclitaxel, alcohol, polyoxyethylated castor oil, other chemotherapy, other medicines, foods, dyes, or preservatives pregnant or trying to get pregnant breast-feeding How should I use this medication? This drug is given as an infusion into a vein. It is administered in a hospital or clinic by a specially trained health care professional. Talk to your pediatrician regarding the use of this medicine in  children. Special care may be needed. Overdosage: If you think you have taken too much of this medicine contact a poison control center or emergency room at once. NOTE: This medicine is only for you. Do not share this medicine with others. What if I miss a dose? It is important not to miss your dose. Call your doctor or health care professional if you are unable to keep an appointment. What may interact with this medication? Do not take this medicine with any of the following medications: live virus vaccines This medicine may also interact with the following medications: antiviral medicines for hepatitis, HIV or AIDS certain antibiotics like erythromycin and clarithromycin certain medicines for fungal infections like ketoconazole and itraconazole certain medicines for seizures like carbamazepine, phenobarbital, phenytoin gemfibrozil nefazodone rifampin St. John's wort This list may not describe all possible interactions. Give your health care provider a list of all the medicines, herbs, non-prescription drugs, or dietary supplements you use. Also tell them if you smoke, drink alcohol, or use illegal drugs. Some items may interact with your medicine. What should I watch for while using this medication? Your condition will be monitored carefully while you are receiving this medicine. You will need important blood work done while you are taking this medicine. This medicine can cause serious allergic reactions. To reduce your risk you will need to take other medicine(s) before treatment with this medicine. If you experience allergic reactions like skin rash, itching or hives, swelling of the face, lips, or tongue, tell your doctor or health care professional right away. In some cases, you may be given additional medicines to help with side effects. Follow all directions for their use. This drug may make you feel generally unwell. This is not uncommon, as chemotherapy can affect healthy cells as  well as cancer cells. Report any side effects. Continue your course of treatment even though you feel ill unless your doctor tells you to stop. Call your doctor or health care professional for advice if you get a fever, chills or sore throat, or other symptoms of a cold or flu. Do not treat yourself. This drug decreases your body's ability to fight infections. Try to avoid being around people who are sick. This medicine may increase your risk to bruise or bleed. Call your doctor or health care professional if you notice any unusual bleeding. Be careful brushing and flossing your teeth or using a toothpick because you may get an infection or bleed more easily. If you have any dental work done, tell your dentist you are receiving this medicine. Avoid taking products that contain aspirin, acetaminophen, ibuprofen, naproxen, or ketoprofen unless instructed by your doctor. These medicines may hide a fever. Do not become pregnant while taking this medicine. Women should inform their doctor if they wish to become pregnant or think they might be pregnant. There is a potential for serious side effects to an unborn child. Talk to your health care professional or pharmacist for more information. Do not breast-feed an infant while taking this medicine. Men are advised not to father a child while receiving this medicine. This product may contain alcohol. Ask  your pharmacist or healthcare provider if this medicine contains alcohol. Be sure to tell all healthcare providers you are taking this medicine. Certain medicines, like metronidazole and disulfiram, can cause an unpleasant reaction when taken with alcohol. The reaction includes flushing, headache, nausea, vomiting, sweating, and increased thirst. The reaction can last from 30 minutes to several hours. What side effects may I notice from receiving this medication? Side effects that you should report to your doctor or health care professional as soon as  possible: allergic reactions like skin rash, itching or hives, swelling of the face, lips, or tongue breathing problems changes in vision fast, irregular heartbeat high or low blood pressure mouth sores pain, tingling, numbness in the hands or feet signs of decreased platelets or bleeding - bruising, pinpoint red spots on the skin, black, tarry stools, blood in the urine signs of decreased red blood cells - unusually weak or tired, feeling faint or lightheaded, falls signs of infection - fever or chills, cough, sore throat, pain or difficulty passing urine signs and symptoms of liver injury like dark yellow or brown urine; general ill feeling or flu-like symptoms; light-colored stools; loss of appetite; nausea; right upper belly pain; unusually weak or tired; yellowing of the eyes or skin swelling of the ankles, feet, hands unusually slow heartbeat Side effects that usually do not require medical attention (report to your doctor or health care professional if they continue or are bothersome): diarrhea hair loss loss of appetite muscle or joint pain nausea, vomiting pain, redness, or irritation at site where injected tiredness This list may not describe all possible side effects. Call your doctor for medical advice about side effects. You may report side effects to FDA at 1-800-FDA-1088. Where should I keep my medication? This drug is given in a hospital or clinic and will not be stored at home. NOTE: This sheet is a summary. It may not cover all possible information. If you have questions about this medicine, talk to your doctor, pharmacist, or health care provider.  2023 Elsevier/Gold Standard (2021-04-21 00:00:00)       To help prevent nausea and vomiting after your treatment, we encourage you to take your nausea medication as directed.  BELOW ARE SYMPTOMS THAT SHOULD BE REPORTED IMMEDIATELY: *FEVER GREATER THAN 100.4 F (38 C) OR HIGHER *CHILLS OR SWEATING *NAUSEA AND VOMITING  THAT IS NOT CONTROLLED WITH YOUR NAUSEA MEDICATION *UNUSUAL SHORTNESS OF BREATH *UNUSUAL BRUISING OR BLEEDING *URINARY PROBLEMS (pain or burning when urinating, or frequent urination) *BOWEL PROBLEMS (unusual diarrhea, constipation, pain near the anus) TENDERNESS IN MOUTH AND THROAT WITH OR WITHOUT PRESENCE OF ULCERS (sore throat, sores in mouth, or a toothache) UNUSUAL RASH, SWELLING OR PAIN  UNUSUAL VAGINAL DISCHARGE OR ITCHING   Items with * indicate a potential emergency and should be followed up as soon as possible or go to the Emergency Department if any problems should occur.  Please show the CHEMOTHERAPY ALERT CARD or IMMUNOTHERAPY ALERT CARD at check-in to the Emergency Department and triage nurse.  Should you have questions after your visit or need to cancel or reschedule your appointment, please contact [facility] [phone]  and follow the prompts.  Office hours are 8:00 a.m. to 4:30 p.m. Monday - Friday. Please note that voicemails left after 4:00 p.m. may not be returned until the following business day.  We are closed weekends and major holidays. You have access to a nurse at all times for urgent questions. Please call the main number to the clinic  [phone] and follow the prompts.  For any non-urgent questions, you may also contact your provider using MyChart. We now offer e-Visits for anyone 15 and older to request care online for non-urgent symptoms. For details visit [email or web address].   Also download the MyChart app! Go to the app store, search "MyChart", open the app, select [facility], and log in with your MyChart username and password.  Masks are optional in the cancer centers. If you would like for your care team to wear a mask while they are taking care of you, please let them know. For doctor visits, patients may have with them one support person who is at least 53 years old. At this time, visitors are not allowed in the infusion area.

## 2021-11-21 NOTE — Progress Notes (Signed)
Patients heart rate ranges from 99 to 105 with no s/s of distress noted.  Patient stated she always has a high heart rate.  Dr. Delton Coombes notified.   Patient tolerated chemotherapy with no complaints voiced.  Side effects with management reviewed with understanding verbalized.  Port site clean and dry with no bruising or swelling noted at site.  Good blood return noted before and after administration of chemotherapy.  Band aid applied.  Patient left in satisfactory condition with VSS and no s/s of distress noted.

## 2021-11-22 ENCOUNTER — Encounter (HOSPITAL_COMMUNITY): Payer: Self-pay | Admitting: Hematology

## 2021-11-22 ENCOUNTER — Telehealth: Payer: Self-pay

## 2021-11-22 ENCOUNTER — Telehealth: Payer: Self-pay | Admitting: *Deleted

## 2021-11-22 NOTE — Telephone Encounter (Signed)
24 hour follow up after Taxol/Carbo. Patient stated that she has not had any nausea, has had a good appetite and feels fine. She did state that she feels like she is "carrying more fluid from her hips down" since her infusion yesterday. She does not have any associated SOB. She was advised to try to keep her legs elevated and she will let the nurse know tomorrow when she comes in for her injection appointment. No other questions or concerns.

## 2021-11-22 NOTE — Telephone Encounter (Signed)
Spoke with the patient and scheduled a new patient with Dr Berline Lopes on 7/10 at 10:30 am. Patient given an arrival time of 10 am.  Patient also given the address and phone number for the clinic; along with the policy for visitors.

## 2021-11-22 NOTE — Progress Notes (Signed)

## 2021-11-23 ENCOUNTER — Inpatient Hospital Stay (HOSPITAL_COMMUNITY): Payer: Commercial Managed Care - PPO

## 2021-11-23 ENCOUNTER — Ambulatory Visit: Payer: Commercial Managed Care - PPO | Admitting: General Surgery

## 2021-11-23 ENCOUNTER — Encounter (HOSPITAL_COMMUNITY): Payer: Self-pay

## 2021-11-23 VITALS — BP 124/83 | HR 110 | Temp 96.2°F | Resp 20

## 2021-11-23 DIAGNOSIS — C482 Malignant neoplasm of peritoneum, unspecified: Secondary | ICD-10-CM

## 2021-11-23 DIAGNOSIS — Z5111 Encounter for antineoplastic chemotherapy: Secondary | ICD-10-CM | POA: Diagnosis not present

## 2021-11-23 MED ORDER — FUROSEMIDE 20 MG PO TABS: 20.0000 mg | ORAL_TABLET | Freq: Every day | ORAL | 2 refills | Status: DC | PRN

## 2021-11-23 MED ORDER — PEGFILGRASTIM INJECTION 6 MG/0.6ML ~~LOC~~
6.0000 mg | PREFILLED_SYRINGE | Freq: Once | SUBCUTANEOUS | Status: AC
  Administered 2021-11-23: 6 mg via SUBCUTANEOUS
  Filled 2021-11-23: qty 0.6

## 2021-11-23 NOTE — Progress Notes (Signed)
Patient here for injection.  Stated lower leg and feet swelling.  Stated "the outer part of my body feels numb" and her "skin" feels numb.  No changes in breathing.  Elevating legs at home and does not have any fluid pills at home.  No problems with breathing or swallowing.  No s/s of distress noted.  Dr. Delton Coombes notified.   Patient seen by Dr. Delton Coombes with orders sent to pharmacy for lasix.  Patient verbalized understanding and when to go to the ER and call the [facility].    Patient tolerated injection with no complaints voiced.  Site clean and dry with no bruising or swelling noted at site.  See MAR for details.  Band aid applied.  Patient stable during and after injection.  Vss with discharge and left in satisfactory condition with no s/s of distress noted.

## 2021-11-23 NOTE — Patient Instructions (Signed)
[facility]  Discharge Instructions: Thank you for choosing [facility] to provide your oncology and hematology care.  If you have a lab appointment with the [facility], please come in thru the Main Entrance and check in at the main information desk.  Wear comfortable clothing and clothing appropriate for easy access to any Portacath or PICC line.   We strive to give you quality time with your provider. You may need to reschedule your appointment if you arrive late (15 or more minutes).  Arriving late affects you and other patients whose appointments are after yours.  Also, if you miss three or more appointments without notifying the office, you may be dismissed from the clinic at the provider's discretion.      For prescription refill requests, have your pharmacy contact our office and allow 72 hours for refills to be completed.    Today you received the following chemotherapy and/or immunotherapy agents neulasta        To help prevent nausea and vomiting after your treatment, we encourage you to take your nausea medication as directed.  BELOW ARE SYMPTOMS THAT SHOULD BE REPORTED IMMEDIATELY: *FEVER GREATER THAN 100.4 F (38 C) OR HIGHER *CHILLS OR SWEATING *NAUSEA AND VOMITING THAT IS NOT CONTROLLED WITH YOUR NAUSEA MEDICATION *UNUSUAL SHORTNESS OF BREATH *UNUSUAL BRUISING OR BLEEDING *URINARY PROBLEMS (pain or burning when urinating, or frequent urination) *BOWEL PROBLEMS (unusual diarrhea, constipation, pain near the anus) TENDERNESS IN MOUTH AND THROAT WITH OR WITHOUT PRESENCE OF ULCERS (sore throat, sores in mouth, or a toothache) UNUSUAL RASH, SWELLING OR PAIN  UNUSUAL VAGINAL DISCHARGE OR ITCHING   Items with * indicate a potential emergency and should be followed up as soon as possible or go to the Emergency Department if any problems should occur.  Please show the CHEMOTHERAPY ALERT CARD or IMMUNOTHERAPY ALERT CARD at check-in to the Emergency  Department and triage nurse.  Should you have questions after your visit or need to cancel or reschedule your appointment, please contact [facility] [phone]  and follow the prompts.  Office hours are 8:00 a.m. to 4:30 p.m. Monday - Friday. Please note that voicemails left after 4:00 p.m. may not be returned until the following business day.  We are closed weekends and major holidays. You have access to a nurse at all times for urgent questions. Please call the main number to the clinic [phone] and follow the prompts.  For any non-urgent questions, you may also contact your provider using MyChart. We now offer e-Visits for anyone 89 and older to request care online for non-urgent symptoms. For details visit [email or web address].   Also download the MyChart app! Go to the app store, search "MyChart", open the app, select [facility], and log in with your MyChart username and password.  Masks are optional in the cancer centers. If you would like for your care team to wear a mask while they are taking care of you, please let them know. For doctor visits, patients may have with them one support person who is at least 53 years old. At this time, visitors are not allowed in the infusion area.

## 2021-11-24 ENCOUNTER — Encounter: Payer: Commercial Managed Care - PPO | Admitting: General Surgery

## 2021-11-24 ENCOUNTER — Inpatient Hospital Stay (HOSPITAL_COMMUNITY): Payer: Commercial Managed Care - PPO

## 2021-11-27 ENCOUNTER — Encounter (HOSPITAL_COMMUNITY): Payer: Self-pay

## 2021-11-27 ENCOUNTER — Other Ambulatory Visit (HOSPITAL_COMMUNITY): Payer: Self-pay

## 2021-11-28 ENCOUNTER — Encounter (HOSPITAL_COMMUNITY): Payer: Self-pay | Admitting: Hematology

## 2021-11-28 MED ORDER — DIPHENOXYLATE-ATROPINE 2.5-0.025 MG PO TABS: 1.0000 | ORAL_TABLET | Freq: Four times a day (QID) | ORAL | 3 refills | Status: DC | PRN

## 2021-12-07 ENCOUNTER — Encounter: Payer: Self-pay | Admitting: Gynecologic Oncology

## 2021-12-10 NOTE — Progress Notes (Signed)
GYNECOLOGIC ONCOLOGY NEW PATIENT CONSULTATION   Patient Name: Nicole Santos  Patient Age: 51 y.o. Date of Service: 12/11/21 Referring Provider: Dr. [name]  Primary Care Provider: Gwenlyn Fudge, FNP Consulting Provider: Eugene Garnet, MD   Assessment/Plan:  Postmenopausal patient with advanced gynecologic malignancy.  I reviewed with the patient her work-up thus far including imaging and findings at the time of her surgery.  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  Given no obvious ovarian or fallopian tube mass, this was suspected to be primary peritoneal carcinoma.  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  The other possibility, in the setting of her remote history of cervix cancer treated with primary chemoradiation, is that she has developed serous uterine cancer.  Although she has not had any vaginal bleeding, given her prior radiation, she may have significant cervical stenosis.  While her endometrial lining was rather homogeneous and only approximately 8 mm on ultrasound, it would not be unusual in the setting of a high-grade uterine tumor to have a thin endometrial lining.  The patient declined having a pelvic exam.  I think that endometrial sampling in clinic would be quite challenging in the setting of her radiation history and her reported intolerance of pelvic exams.  I am going to reach out to see if pathology can add HER2 testing to her prior biopsy.  Classifying her cancer as uterine would change our treatment options in the sense that she could be a candidate for PD-L1 inhibitor in the setting of advanced disease or trastuzumab if she is HER2 positive with serous uterine cancer.  In the event that this is primary peritoneal, ovarian, or fallopian tube cancer, I discussed that the treatment approach for this disease is typically combination of cytoreductive  surgery and chemotherapy. I discussed that sequencing of this can be either with upfront debulking followed by adjuvant chemotherapy sequentially or neoadjuvant chemotherapy followed by an interval cytoreductive attempt, then additional chemotherapy. This latter approach is associated with a reduced perioperative morbidity at the time of surgery.  I discussed that decisions regarding sequencing of therapy is individualized taking into account individual patient health, in addition to the apparent tumor distribution on imaging, and likelihood of complete surgical resection at the time of surgery. I discussed that the overall survival observed in patients is equivalent for both approaches (neoadjuvant chemotherapy versus primary debulking surgery) provided that there is an optimal cytoreductive effort at the time of surgery (regardless of the timing of that surgery). Given the observed decreased morbidity of neoadjuvant chemotherapy as well as the significant disease burden noted at the time of her surgery, with preserved survival outcomes, I favor this approach for her.    Identifying whether or not this is uterine cancer would be important prior to or at the time of interval surgery.  Discussed the importance of a pelvic exam at her next visit with me.  If endometrial sampling is not possible in clinic, then I will recommend attempted endometrial sampling in the operating room at the start of her surgery.  If this proves difficult given prior radiation, she may require total hysterectomy to rule out uterine primary.  We discussed some today the difficulty of surgery in the pelvis after definitive radiation for cervical cancer.  She has had about a 20-year interval since that treatment.  We will tentatively plan for another visit in August with a pelvic exam, possible attempted endometrial  biopsy.  We will add HER2 testing now.  Discussed importance of glycemic control to help decrease morbidity at the time of  surgery.  A copy of this note was sent to the patient's referring provider.   70 minutes of total time was spent for this patient encounter, including preparation, face-to-face counseling with the patient and coordination of care, and documentation of the encounter.   Eugene Garnet, MD  Division of Gynecologic Oncology  Department of Obstetrics and Gynecology  [facility]  ___________________________________________  Chief Complaint: Chief Complaint  Patient presents with   Malignant neoplasm of ovary, unspecified laterality [facility])    History of Present Illness:  Nicole Santos is a 51 y.o. y.o. female who is seen in consultation at the request of Dr. Ellin Saba for an evaluation of metastatic gynecologic malignancy.  Her work-up is as noted below: 11/08/21: RUQ ultrasound showed cholelithiasis with gallbladder wall thickening and sonographic Murphy sign. Upper abdominal ascites.  11/09/21: Saw Dr. Lovell Sheehan for RUQ and epigastric pain with findings of cholelithiasis on imaging.  11/13/21: Taken for lsc cholecystectomy but given findings of carcinomatosis, biopsies performed and procedure abandoned. Peritoneal biopsies and omental biopsy showed metastatic high-grade serous carcinoma, favoring GYN primary. Ascites with malignant cells c/w adenocarcinoma.  CA-125: 1,269. 11/13/21: CT C/A/P showed trace ascites, extensive peritoneal thickening, nodularity and omental caking. NO obvious pelvic masses. Small left pleural effusion and associated atelectasis, worrisome for malignant effusion. Cholelithiasis without evidence of cholecystitis. Nonobstructive right nephrolithiasis.  11/14/21: Pelvic ultrasound reveals grossly normal uterus with endometrium measuring 8mm. Neither ovary visualized due to body habitus and bowel gas. Small ascites.  11/15/21: Port placed. 6/20: Received cycle #1 carboplatin/taxol.  Patient's history is notable for cervix cancer treated in 2000 at  [facility].  She was treated with primary chemoradiation.  Unfortunately, I am able to see further details from notes in care everywhere.  Today, patient reports doing well.  She presents for cycle 2 tomorrow.  She notes that appetite has been overall good this week, was decreased initially after chemotherapy.  She denies any nausea or emesis.  Since starting treatment, she has had some issue with diarrhea as well as increased stool frequency.  She was taking Imodium without much effect.  Stool is now more formed.  She also notes slight increase in urinary frequency from baseline.  She denies any abdominal or pelvic pain.  She has been on Ozempic for the last year notes some weight loss while on this medication although has not been taking it for the last month or so.  She denies any vaginal bleeding or discharge.  Patient's history is notable for diabetes.  She checks her blood sugars at home which all run less than 200.  She lives with her husband, daughter, and 4 grandsons.  PAST MEDICAL HISTORY:  Past Medical History:  Diagnosis Date   Acute respiratory disease due to COVID-19 virus 02/02/2019   Anxiety    Cancer (HCC)    Depression    Diabetes mellitus without complication (HCC)    Dyspnea    Dysrhythmia    Hypertension    Ovarian cancer (HCC) 11/14/2021   Sleep apnea    doesn't use CPAP   Tachycardia      PAST SURGICAL HISTORY:  Past Surgical History:  Procedure Laterality Date   KNEE SURGERY Right    Arthroscopic   LAPAROSCOPIC ABDOMINAL EXPLORATION N/A 11/13/2021   Procedure: Exploratory laparoscopy;  Surgeon: Franky Macho, MD;  Location: [facility];  Service: General;  Laterality: N/A;   PORTACATH PLACEMENT Left 11/15/2021   Procedure: INSERTION PORT-A-CATH;  Surgeon: Franky Macho, MD;  Location: [facility];  Service: General;  Laterality: Left;   TONSILLECTOMY AND ADENOIDECTOMY      OB/GYN HISTORY:  OB History  Gravida Para Term Preterm AB Living  3 3       3   SAB IAB  Ectopic Multiple Live Births               # Outcome Date GA Lbr Len/2nd Weight Sex Delivery Anes PTL Lv  3 Para           2 Para           1 Para             No LMP recorded. Patient has had an ablation.  Age at menarche: 56 Age at menopause: Approximately 20 years ago Hx of HRT: Denies Last pap: 2001 History of abnormal pap smears: Yes, see HPI  SCREENING STUDIES:  Last mammogram: 2022  Last colonoscopy: Has not had  MEDICATIONS: Outpatient Encounter Medications as of 12/11/2021  Medication Sig   aspirin EC 81 MG tablet Take 81 mg by mouth daily.   atorvastatin (LIPITOR) 20 MG tablet Take 1 tablet (20 mg total) by mouth daily.   CARBOplatin 1000 MG/100ML SOLN Inject into the vein every 21 ( twenty-one) days.   cimetidine (TAGAMET) 200 MG tablet Take 200 mg by mouth daily.   diphenoxylate-atropine (LOMOTIL) 2.5-0.025 MG tablet Take 1 tablet by mouth 4 (four) times daily as needed for diarrhea or loose stools.   furosemide (LASIX) 20 MG tablet Take 1 tablet (20 mg total) by mouth daily as needed. Take daily in the morning as needed for lower extremity swelling   lidocaine-prilocaine (EMLA) cream Apply to affected area once   PACLitaxel (TAXOL IV) Inject into the vein every 21 ( twenty-one) days.   prochlorperazine (COMPAZINE) 10 MG tablet Take 1 tablet (10 mg total) by mouth every 6 (six) hours as needed (Nausea or vomiting).   HYDROcodone-acetaminophen (NORCO) 10-325 MG tablet Take 1 tablet by mouth every 6 (six) hours as needed. (Patient not taking: Reported on 12/07/2021)   ondansetron (ZOFRAN) 4 MG tablet Take 1 tablet (4 mg total) by mouth every 8 (eight) hours as needed for nausea or vomiting. (Patient not taking: Reported on 12/07/2021)   Semaglutide, 2 MG/DOSE, 8 MG/3ML SOPN Inject 2 mg as directed once a week. (Patient not taking: Reported on 12/07/2021)   [DISCONTINUED] ibuprofen (ADVIL) 200 MG tablet Take 200 mg by mouth every 6 (six) hours as needed for mild pain or moderate  pain.   No facility-administered encounter medications on file as of 12/11/2021.    ALLERGIES:  Allergies  Allergen Reactions   Augmentin [Amoxicillin-Pot Clavulanate] Diarrhea and Nausea And Vomiting   Doxycycline     Abdominal cramps.   Topamax [Topiramate] Nausea And Vomiting     FAMILY HISTORY:  Family History  Problem Relation Age of Onset   Hypertension Mother    Bladder Cancer Maternal Grandmother    Breast cancer Maternal Great-grandmother    Colon cancer Neg Hx    Ovarian cancer Neg Hx    Endometrial cancer Neg Hx    Pancreatic cancer Neg Hx    Prostate cancer Neg Hx      SOCIAL HISTORY:  Social Connections: Not on file    REVIEW OF SYSTEMS:  Denies appetite changes, fevers, chills, fatigue, unexplained weight changes. Denies hearing  loss, neck lumps or masses, mouth sores, ringing in ears or voice changes. Denies cough or wheezing.  Denies shortness of breath. Denies chest pain or palpitations. Denies leg swelling. Denies abdominal distention, pain, blood in stools, constipation, diarrhea, nausea, vomiting, or early satiety. Denies pain with intercourse, dysuria, frequency, hematuria or incontinence. Denies hot flashes, pelvic pain, vaginal bleeding or vaginal discharge.   Denies joint pain, back pain or muscle pain/cramps. Denies itching, rash, or wounds. Denies dizziness, headaches, numbness or seizures. Denies swollen lymph nodes or glands, denies easy bruising or bleeding. Denies anxiety, depression, confusion, or decreased concentration.  Physical Exam:  Vital Signs for this encounter:  Blood pressure 117/74, pulse (!) 119, temperature 98.4 F (36.9 C), temperature source Oral, resp. rate 18, height 5\' 3"  (1.6 m), weight 220 lb (99.8 kg), SpO2 99 %. Body mass index is 38.97 kg/m. General: Alert, oriented, no acute distress.  HEENT: Normocephalic, atraumatic. Sclera anicteric.  Chest: Clear to auscultation bilaterally. No wheezes, rhonchi, or  rales. Cardiovascular: Tachycardic with Hrs in 100s, regular rhythm, no murmurs, rubs, or gallops.  Abdomen: Obese. Normoactive bowel sounds. Soft, nondistended, nontender to palpation. No masses or hepatosplenomegaly appreciated. No palpable fluid wave.  Extremities: Grossly normal range of motion. Warm, well perfused. No edema bilaterally.  Lymphatics: No cervical or supraclavicular adenopathy.  GU: Declined today.  LABORATORY AND RADIOLOGIC DATA:  Outside medical records were reviewed to synthesize the above history, along with the history and physical obtained during the visit.   Lab Results  Component Value Date   WBC 7.4 11/21/2021   HGB 12.6 11/21/2021   HCT 38.5 11/21/2021   PLT 481 (H) 11/21/2021   GLUCOSE 111 (H) 11/21/2021   CHOL 141 09/19/2021   TRIG 130 09/19/2021   HDL 35 (L) 09/19/2021   LDLCALC 83 09/19/2021   ALT 11 11/21/2021   AST 19 11/21/2021   NA 136 11/21/2021   K 3.9 11/21/2021   CL 103 11/21/2021   CREATININE 0.68 11/21/2021   BUN 9 11/21/2021   CO2 27 11/21/2021   TSH 0.949 02/19/2020   INR 1.0 02/02/2019   HGBA1C 5.4 11/13/2021

## 2021-12-11 ENCOUNTER — Inpatient Hospital Stay: Payer: Commercial Managed Care - PPO | Attending: Gynecologic Oncology | Admitting: Gynecologic Oncology

## 2021-12-11 ENCOUNTER — Other Ambulatory Visit: Payer: Self-pay

## 2021-12-11 ENCOUNTER — Encounter: Payer: Self-pay | Admitting: Gynecologic Oncology

## 2021-12-11 VITALS — BP 117/74 | HR 119 | Temp 98.4°F | Resp 18 | Ht 63.0 in | Wt 220.0 lb

## 2021-12-11 DIAGNOSIS — Z8616 Personal history of COVID-19: Secondary | ICD-10-CM | POA: Diagnosis not present

## 2021-12-11 DIAGNOSIS — F419 Anxiety disorder, unspecified: Secondary | ICD-10-CM | POA: Insufficient documentation

## 2021-12-11 DIAGNOSIS — Z8541 Personal history of malignant neoplasm of cervix uteri: Secondary | ICD-10-CM | POA: Diagnosis not present

## 2021-12-11 DIAGNOSIS — J90 Pleural effusion, not elsewhere classified: Secondary | ICD-10-CM | POA: Diagnosis not present

## 2021-12-11 DIAGNOSIS — R978 Other abnormal tumor markers: Secondary | ICD-10-CM

## 2021-12-11 DIAGNOSIS — C579 Malignant neoplasm of female genital organ, unspecified: Secondary | ICD-10-CM | POA: Diagnosis not present

## 2021-12-11 DIAGNOSIS — Z7982 Long term (current) use of aspirin: Secondary | ICD-10-CM | POA: Diagnosis not present

## 2021-12-11 DIAGNOSIS — I10 Essential (primary) hypertension: Secondary | ICD-10-CM | POA: Diagnosis not present

## 2021-12-11 DIAGNOSIS — Z9221 Personal history of antineoplastic chemotherapy: Secondary | ICD-10-CM | POA: Diagnosis not present

## 2021-12-11 DIAGNOSIS — C786 Secondary malignant neoplasm of retroperitoneum and peritoneum: Secondary | ICD-10-CM

## 2021-12-11 DIAGNOSIS — G4730 Sleep apnea, unspecified: Secondary | ICD-10-CM | POA: Insufficient documentation

## 2021-12-11 DIAGNOSIS — F32A Depression, unspecified: Secondary | ICD-10-CM | POA: Diagnosis not present

## 2021-12-11 DIAGNOSIS — R971 Elevated cancer antigen 125 [CA 125]: Secondary | ICD-10-CM

## 2021-12-11 DIAGNOSIS — Z780 Asymptomatic menopausal state: Secondary | ICD-10-CM | POA: Diagnosis not present

## 2021-12-11 DIAGNOSIS — Z79899 Other long term (current) drug therapy: Secondary | ICD-10-CM | POA: Diagnosis not present

## 2021-12-11 DIAGNOSIS — Z923 Personal history of irradiation: Secondary | ICD-10-CM

## 2021-12-11 DIAGNOSIS — R180 Malignant ascites: Secondary | ICD-10-CM | POA: Diagnosis not present

## 2021-12-11 DIAGNOSIS — Z7985 Long-term (current) use of injectable non-insulin antidiabetic drugs: Secondary | ICD-10-CM | POA: Diagnosis not present

## 2021-12-11 DIAGNOSIS — C569 Malignant neoplasm of unspecified ovary: Secondary | ICD-10-CM

## 2021-12-11 DIAGNOSIS — Z6838 Body mass index (BMI) 38.0-38.9, adult: Secondary | ICD-10-CM

## 2021-12-11 DIAGNOSIS — C800 Disseminated malignant neoplasm, unspecified: Secondary | ICD-10-CM

## 2021-12-11 DIAGNOSIS — E119 Type 2 diabetes mellitus without complications: Secondary | ICD-10-CM | POA: Diagnosis not present

## 2021-12-11 NOTE — Patient Instructions (Signed)
It was a pleasure meeting you today.  As we discussed, given the amount of cancer seen on your original scan, my recommendation had been to start with chemotherapy to shrink the cancer.  You will get a CT scan after 3-4 cycles of chemotherapy.  We will then meet to discuss whether surgery is an option based on your response to treatment.  We will tentatively schedule a visit for you to see me towards the end of August with a tentative surgery date just after that.  This can always be moved if needed depending on the results of your CT scan.

## 2021-12-12 ENCOUNTER — Inpatient Hospital Stay (HOSPITAL_COMMUNITY): Payer: Commercial Managed Care - PPO | Attending: Hematology

## 2021-12-12 ENCOUNTER — Inpatient Hospital Stay (HOSPITAL_BASED_OUTPATIENT_CLINIC_OR_DEPARTMENT_OTHER): Payer: Commercial Managed Care - PPO | Admitting: Hematology

## 2021-12-12 ENCOUNTER — Encounter (HOSPITAL_COMMUNITY): Payer: Self-pay | Admitting: Hematology

## 2021-12-12 ENCOUNTER — Telehealth: Payer: Self-pay

## 2021-12-12 ENCOUNTER — Inpatient Hospital Stay (HOSPITAL_COMMUNITY): Payer: Commercial Managed Care - PPO

## 2021-12-12 VITALS — BP 111/76 | HR 115 | Temp 97.7°F | Resp 16 | Ht 63.0 in | Wt 218.8 lb

## 2021-12-12 VITALS — HR 89

## 2021-12-12 DIAGNOSIS — R5383 Other fatigue: Secondary | ICD-10-CM | POA: Diagnosis not present

## 2021-12-12 DIAGNOSIS — R059 Cough, unspecified: Secondary | ICD-10-CM | POA: Insufficient documentation

## 2021-12-12 DIAGNOSIS — C482 Malignant neoplasm of peritoneum, unspecified: Secondary | ICD-10-CM | POA: Insufficient documentation

## 2021-12-12 DIAGNOSIS — Z79899 Other long term (current) drug therapy: Secondary | ICD-10-CM | POA: Diagnosis not present

## 2021-12-12 DIAGNOSIS — R110 Nausea: Secondary | ICD-10-CM | POA: Diagnosis not present

## 2021-12-12 DIAGNOSIS — Z5189 Encounter for other specified aftercare: Secondary | ICD-10-CM | POA: Insufficient documentation

## 2021-12-12 DIAGNOSIS — Z7982 Long term (current) use of aspirin: Secondary | ICD-10-CM | POA: Diagnosis not present

## 2021-12-12 DIAGNOSIS — Z5111 Encounter for antineoplastic chemotherapy: Secondary | ICD-10-CM | POA: Insufficient documentation

## 2021-12-12 LAB — CBC WITH DIFFERENTIAL/PLATELET
Abs Immature Granulocytes: 0.56 10*3/uL — ABNORMAL HIGH (ref 0.00–0.07)
Basophils Absolute: 0.1 10*3/uL (ref 0.0–0.1)
Basophils Relative: 1 %
Eosinophils Absolute: 0.2 10*3/uL (ref 0.0–0.5)
Eosinophils Relative: 1 %
HCT: 34.1 % — ABNORMAL LOW (ref 36.0–46.0)
Hemoglobin: 10.9 g/dL — ABNORMAL LOW (ref 12.0–15.0)
Immature Granulocytes: 5 %
Lymphocytes Relative: 8 %
Lymphs Abs: 1 10*3/uL (ref 0.7–4.0)
MCH: 27.5 pg (ref 26.0–34.0)
MCHC: 32 g/dL (ref 30.0–36.0)
MCV: 86.1 fL (ref 80.0–100.0)
Monocytes Absolute: 0.8 10*3/uL (ref 0.1–1.0)
Monocytes Relative: 7 %
Neutro Abs: 9.5 10*3/uL — ABNORMAL HIGH (ref 1.7–7.7)
Neutrophils Relative %: 78 %
Platelets: 665 10*3/uL — ABNORMAL HIGH (ref 150–400)
RBC: 3.96 MIL/uL (ref 3.87–5.11)
RDW: 15.8 % — ABNORMAL HIGH (ref 11.5–15.5)
WBC: 12.2 10*3/uL — ABNORMAL HIGH (ref 4.0–10.5)
nRBC: 0 % (ref 0.0–0.2)

## 2021-12-12 LAB — COMPREHENSIVE METABOLIC PANEL
ALT: 16 U/L (ref 0–44)
AST: 22 U/L (ref 15–41)
Albumin: 2.5 g/dL — ABNORMAL LOW (ref 3.5–5.0)
Alkaline Phosphatase: 67 U/L (ref 38–126)
Anion gap: 9 (ref 5–15)
BUN: 8 mg/dL (ref 6–20)
CO2: 25 mmol/L (ref 22–32)
Calcium: 8.3 mg/dL — ABNORMAL LOW (ref 8.9–10.3)
Chloride: 102 mmol/L (ref 98–111)
Creatinine, Ser: 0.7 mg/dL (ref 0.44–1.00)
GFR, Estimated: >60 mL/min (ref >60)
Glucose, Bld: 124 mg/dL — ABNORMAL HIGH (ref 70–99)
Potassium: 3.8 mmol/L (ref 3.5–5.1)
Sodium: 136 mmol/L (ref 135–145)
Total Bilirubin: 1 mg/dL (ref 0.3–1.2)
Total Protein: 6.7 g/dL (ref 6.5–8.1)

## 2021-12-12 LAB — MAGNESIUM: Magnesium: 1.7 mg/dL (ref 1.7–2.4)

## 2021-12-12 MED ORDER — SODIUM CHLORIDE 0.9 % IV SOLN
140.0000 mg/m2 | Freq: Once | INTRAVENOUS | Status: AC
  Administered 2021-12-12: 300 mg via INTRAVENOUS
  Filled 2021-12-12: qty 50

## 2021-12-12 MED ORDER — SODIUM CHLORIDE 0.9 % IV SOLN
10.0000 mg | Freq: Once | INTRAVENOUS | Status: AC
  Administered 2021-12-12: 10 mg via INTRAVENOUS
  Filled 2021-12-12: qty 10

## 2021-12-12 MED ORDER — PALONOSETRON HCL INJECTION 0.25 MG/5ML
0.2500 mg | Freq: Once | INTRAVENOUS | Status: AC
  Administered 2021-12-12: 0.25 mg via INTRAVENOUS
  Filled 2021-12-12: qty 5

## 2021-12-12 MED ORDER — SODIUM CHLORIDE 0.9 % IV SOLN
750.0000 mg | Freq: Once | INTRAVENOUS | Status: AC
  Administered 2021-12-12: 750 mg via INTRAVENOUS
  Filled 2021-12-12: qty 75

## 2021-12-12 MED ORDER — DIPHENHYDRAMINE HCL 50 MG/ML IJ SOLN
50.0000 mg | Freq: Once | INTRAMUSCULAR | Status: AC
  Administered 2021-12-12: 50 mg via INTRAVENOUS
  Filled 2021-12-12: qty 1

## 2021-12-12 MED ORDER — SODIUM CHLORIDE 0.9 % IV SOLN
150.0000 mg | Freq: Once | INTRAVENOUS | Status: AC
  Administered 2021-12-12: 150 mg via INTRAVENOUS
  Filled 2021-12-12: qty 5

## 2021-12-12 MED ORDER — SODIUM CHLORIDE 0.9 % IV SOLN: Freq: Once | INTRAVENOUS | Status: AC

## 2021-12-12 MED ORDER — FAMOTIDINE IN NACL 20-0.9 MG/50ML-% IV SOLN
20.0000 mg | Freq: Once | INTRAVENOUS | Status: AC
  Administered 2021-12-12: 20 mg via INTRAVENOUS
  Filled 2021-12-12: qty 50

## 2021-12-12 NOTE — Progress Notes (Signed)
[name],  [id number]   CLINIC:  Medical Oncology/Hematology  PCP:  Loman Brooklyn, [place] / [place] [address] [phone]   REASON FOR VISIT:  Follow-up for high-grade serous carcinoma of peritoneum  PRIOR THERAPY: none  NGS Results: not done  CURRENT THERAPY: Carboplatin (AUC 6) / Paclitaxel (175) q21d x 6 cycles  BRIEF ONCOLOGIC HISTORY:  Oncology History  Primary peritoneal carcinomatosis ([facility])  11/17/2021 Initial Diagnosis   Primary peritoneal carcinomatosis ([facility])   11/21/2021 -  Chemotherapy   Patient is on Treatment Plan : OVARIAN Carboplatin (AUC 6) / Paclitaxel (175) q21d x 6 cycles       CANCER STAGING:  Cancer Staging  Primary peritoneal carcinomatosis ([facility]) Staging form: Ovary, Fallopian Tube, and Primary Peritoneal Carcinoma, AJCC 8th Edition - Clinical stage from 11/17/2021: FIGO Stage IIIC (cT3c, cM0) - Unsigned   INTERVAL HISTORY:  [name], a 51 y.o. female, returns for routine follow-up and consideration for next cycle of chemotherapy. Nicole Santos was last seen on 11/17/2021.  Due for cycle #2 of Carboplatin and Taxol today.   Overall, she tells me she has been feeling pretty well. She reports diarrhea for 1 week following treatment; this was not helped by Imodium, and lomotil provided 4 hours of relief if eaten prior to meals. She has not needed to take lomotil for 1 week, and when she was taking it she was taking 1 tablet prn. She reports she had more than 10 episodes of diarrhea a daily. She also reports "all over" numbness following treatment which was not helped by Benadryl; this numbness has improved, and now she reports constant numbness and pins-and-needles in her toes and soles. She reports intermittent tingling in her fingertips; she denies associated pain or disrupted sleep. She also reports fatigue. She denies nausea. She has lost 18 lbs since her last visit. She took Lasix daily for  4-5 days following treatment. She reports sharp, cramping abdominal pain starting last night lasting in intervals of a few seconds.   Overall, she feels ready for next cycle of chemo today.    REVIEW OF SYSTEMS:  Review of Systems  Constitutional:  Positive for fatigue and unexpected weight change (-18 lbs). Negative for appetite change.  Respiratory:  Positive for cough.   Gastrointestinal:  Positive for abdominal pain (5/10 cramping). Negative for diarrhea (x 1 week - resolved) and nausea.  Neurological:  Positive for numbness (fingers and feet).  Psychiatric/Behavioral:  Negative for sleep disturbance.   All other systems reviewed and are negative.   PAST MEDICAL/SURGICAL HISTORY:  Past Medical History:  Diagnosis Date   Acute respiratory disease due to COVID-19 virus 02/02/2019   Anxiety    Cancer ([facility])    Depression    Diabetes mellitus without complication ([facility])    Dyspnea    Dysrhythmia    Hypertension    Ovarian cancer ([facility]) 11/14/2021   Sleep apnea    doesn't use CPAP   Tachycardia    Past Surgical History:  Procedure Laterality Date   KNEE SURGERY Right    Arthroscopic   LAPAROSCOPIC ABDOMINAL EXPLORATION N/A 11/13/2021   Procedure: Exploratory laparoscopy;  Surgeon: Aviva Signs, MD;  Location: [facility];  Service: General;  Laterality: N/A;   PORTACATH PLACEMENT Left 11/15/2021   Procedure: INSERTION PORT-A-CATH;  Surgeon: Aviva Signs, MD;  Location: [facility];  Service: General;  Laterality: Left;   TONSILLECTOMY AND ADENOIDECTOMY  SOCIAL HISTORY:  Social History   Socioeconomic History   Marital status: Married    Spouse name: Not on file   Number of children: Not on file   Years of education: Not on file   Highest education level: Not on file  Occupational History   Occupation: Unemployed  Tobacco Use   Smoking status: Never   Smokeless tobacco: Never  Vaping Use   Vaping Use: Never used  Substance and Sexual Activity   Alcohol use: No   Drug  use: No   Sexual activity: Not Currently  Other Topics Concern   Not on file  Social History Narrative   Not on file   Social Determinants of Health   Financial Resource Strain: Not on file  Food Insecurity: Not on file  Transportation Needs: Not on file  Physical Activity: Not on file  Stress: Not on file  Social Connections: Not on file  Intimate Partner Violence: Not on file    FAMILY HISTORY:  Family History  Problem Relation Age of Onset   Hypertension Mother    Bladder Cancer Maternal Grandmother    Breast cancer Maternal Great-grandmother    Colon cancer Neg Hx    Ovarian cancer Neg Hx    Endometrial cancer Neg Hx    Pancreatic cancer Neg Hx    Prostate cancer Neg Hx     CURRENT MEDICATIONS:  Current Outpatient Medications  Medication Sig Dispense Refill   aspirin EC 81 MG tablet Take 81 mg by mouth daily.     atorvastatin (LIPITOR) 20 MG tablet Take 1 tablet (20 mg total) by mouth daily. 90 tablet 1   CARBOplatin 1000 MG/100ML SOLN Inject into the vein every 21 ( twenty-one) days.     cimetidine (TAGAMET) 200 MG tablet Take 200 mg by mouth daily.     diphenoxylate-atropine (LOMOTIL) 2.5-0.025 MG tablet Take 1 tablet by mouth 4 (four) times daily as needed for diarrhea or loose stools. 30 tablet 3   furosemide (LASIX) 20 MG tablet Take 1 tablet (20 mg total) by mouth daily as needed. Take daily in the morning as needed for lower extremity swelling 30 tablet 2   HYDROcodone-acetaminophen (NORCO) 10-325 MG tablet Take 1 tablet by mouth every 6 (six) hours as needed. (Patient not taking: Reported on 12/07/2021) 25 tablet 0   lidocaine-prilocaine (EMLA) cream Apply to affected area once 30 g 3   ondansetron (ZOFRAN) 4 MG tablet Take 1 tablet (4 mg total) by mouth every 8 (eight) hours as needed for nausea or vomiting. (Patient not taking: Reported on 12/07/2021) 20 tablet 1   PACLitaxel (TAXOL IV) Inject into the vein every 21 ( twenty-one) days.     prochlorperazine  (COMPAZINE) 10 MG tablet Take 1 tablet (10 mg total) by mouth every 6 (six) hours as needed (Nausea or vomiting). 30 tablet 1   Semaglutide, 2 MG/DOSE, 8 MG/3ML SOPN Inject 2 mg as directed once a week. (Patient not taking: Reported on 12/07/2021) 9 mL 1   No current facility-administered medications for this visit.    ALLERGIES:  Allergies  Allergen Reactions   Augmentin [Amoxicillin-Pot Clavulanate] Diarrhea and Nausea And Vomiting   Doxycycline     Abdominal cramps.   Topamax [Topiramate] Nausea And Vomiting    PHYSICAL EXAM:  Performance status (ECOG): 1 - Symptomatic but completely ambulatory  There were no vitals filed for this visit. Wt Readings from Last 3 Encounters:  12/11/21 220 lb (99.8 kg)  11/21/21 240 lb 6.4 oz (  109 kg)  11/17/21 236 lb 3.2 oz (107.1 kg)   Physical Exam Vitals reviewed.  Constitutional:      Appearance: Normal appearance. She is obese.     Comments: In wheelchair  Cardiovascular:     Rate and Rhythm: Normal rate and regular rhythm.     Pulses: Normal pulses.     Heart sounds: Normal heart sounds.  Pulmonary:     Effort: Pulmonary effort is normal.     Breath sounds: Normal breath sounds.  Abdominal:     Palpations: Abdomen is soft. There is no mass.     Tenderness: There is no abdominal tenderness.  Musculoskeletal:     Right lower leg: No edema.     Left lower leg: No edema.  Neurological:     General: No focal deficit present.     Mental Status: She is alert and oriented to person, place, and time.  Psychiatric:        Mood and Affect: Mood normal.        Behavior: Behavior normal.     LABORATORY DATA:  I have reviewed the labs as listed.     Latest Ref Rng & Units 11/21/2021    7:51 AM 11/14/2021    4:46 AM 09/19/2021   10:28 AM  CBC  WBC 4.0 - 10.5 K/uL 7.4  10.2  4.7   Hemoglobin 12.0 - 15.0 g/dL 12.6  11.3  13.8   Hematocrit 36.0 - 46.0 % 38.5  36.1  40.7   Platelets 150 - 400 K/uL 481  535  415       Latest Ref Rng &  Units 11/21/2021    7:51 AM 11/14/2021    4:46 AM 11/13/2021    4:16 PM  CMP  Glucose 70 - 99 mg/dL 111  99    BUN 6 - 20 mg/dL 9  12    Creatinine 0.44 - 1.00 mg/dL 0.68  0.63  0.80   Sodium 135 - 145 mmol/L 136  135    Potassium 3.5 - 5.1 mmol/L 3.9  4.5    Chloride 98 - 111 mmol/L 103  106    CO2 22 - 32 mmol/L 27  24    Calcium 8.9 - 10.3 mg/dL 7.9  7.5    Total Protein 6.5 - 8.1 g/dL 5.4  4.9    Total Bilirubin 0.3 - 1.2 mg/dL 0.7  0.6    Alkaline Phos 38 - 126 U/L 45  30    AST 15 - 41 U/L 19  19    ALT 0 - 44 U/L 11  15      DIAGNOSTIC IMAGING:  I have independently reviewed the scans and discussed with the patient. DG Chest Port 1 View  Result Date: 11/15/2021 CLINICAL DATA:  A 51 year old female presents for evaluation of Port-A-Cath placement. EXAM: PORTABLE CHEST 1 VIEW COMPARISON:  February 02, 2019 and December 13, 2021. FINDINGS: Interval placement of LEFT subclavian Port-A-Cath, tip in the mid to distal superior vena cava. Cardiomediastinal contours and hilar structures are normal. No lobar consolidation. No sign of pleural effusion. No visible pneumothorax. On limited assessment there is no acute skeletal finding. Signs of prior trauma to the LEFT hemithorax involving lateral ribs. IMPRESSION: Interval placement of LEFT subclavian Port-A-Cath, tip in the mid to distal superior vena cava. No acute cardiopulmonary disease. Electronically Signed   By: Zetta Bills M.D.   On: 11/15/2021 12:09   DG C-Arm 1-60 Min-No Report  Result Date: 11/15/2021  Fluoroscopy was utilized by the requesting physician.  No radiographic interpretation.   US PELVIS (TRANSABDOMINAL ONLY)  Result Date: 11/14/2021 CLINICAL DATA:  Malignant ascites. EXAM: TRANSABDOMINAL ULTRASOUND OF PELVIS TECHNIQUE: Transabdominal ultrasound examination of the pelvis was performed including evaluation of the uterus, ovaries, adnexal regions, and pelvic cul-de-sac. Patient declined transvaginal imaging. COMPARISON:  CT  scan of November 13, 2021. FINDINGS: Uterus Measurements: 4.2 x 3.5 x 3.0 cm = volume: 24 mL. No fibroids or other mass visualized. Endometrium Thickness: 8 mm which is within normal limits. No focal abnormality visualized. Right ovary Not visualized due to body habitus and overlying bowel gas. Left ovary Not visualized due to body habitus and overlying bowel gas. Other findings:  Small amount of ascites is noted in the pelvis. IMPRESSION: Uterus is grossly unremarkable. Ovaries are not visualized due to body habitus and overlying bowel gas. Small amount of ascites is noted. Electronically Signed   By: Marijo Conception M.D.   On: 11/14/2021 10:34   CT ABDOMEN PELVIS W WO CONTRAST  Result Date: 11/13/2021 CLINICAL DATA:  Peritoneal and omental metastatic disease and malignant ascites identified at laparotomy for cholecystectomy * Tracking Code: BO * EXAM: CT CHEST WITH CONTRAST CT ABDOMEN AND PELVIS WITH AND WITHOUT CONTRAST TECHNIQUE: Multidetector CT imaging of the chest was performed during intravenous contrast administration. Multidetector CT imaging of the abdomen and pelvis was performed following the standard protocol before and during bolus administration of intravenous contrast. RADIATION DOSE REDUCTION: This exam was performed according to the departmental dose-optimization program which includes automated exposure control, adjustment of the mA and/or kV according to patient size and/or use of iterative reconstruction technique. CONTRAST:  151m OMNIPAQUE IOHEXOL 300 MG/ML  SOLN COMPARISON:  None Available. FINDINGS: CT CHEST FINDINGS Cardiovascular: Scattered aortic atherosclerosis. Normal heart size. No pericardial effusion. Mediastinum/Nodes: No enlarged mediastinal, hilar, or axillary lymph nodes. Thyroid gland, trachea, and esophagus demonstrate no significant findings. Lungs/Pleura: Small left pleural effusion associated atelectasis or consolidation. Scattered areas of alveolar ground-glass airspace  opacity throughout the lungs (series 14, image 50). No pleural effusion or pneumothorax. Musculoskeletal: No chest wall mass or suspicious osseous lesions identified. Chronic, callused fractures of the posterior left ribs. CT ABDOMEN PELVIS FINDINGS Hepatobiliary: No solid liver abnormality is seen. Multiple gallstones. No gallbladder wall thickening, or biliary dilatation. Pancreas: Unremarkable. No pancreatic ductal dilatation or surrounding inflammatory changes. Spleen: Normal in size without significant abnormality. Adrenals/Urinary Tract: Adrenal glands are unremarkable. Small nonobstructive calculus of the inferior pole of the right kidney (series 5, image 62). Left kidney is normal. No mass or suspicious contrast enhancement. Bladder is unremarkable. Stomach/Bowel: Stomach is within normal limits. Appendix is not clearly visualized. No evidence of bowel wall thickening, distention, or inflammatory changes. Colonic diverticulosis. Vascular/Lymphatic: Aortic atherosclerosis. Left common iliac vein stent. Enlarged left and right cardiophrenic angle lymph nodes, measuring up to 1.5 x 1.1 cm (series 12, image 67). Reproductive: No mass or other abnormality. Other: Right upper quadrant and umbilical laparoscopy ports with subcutaneous fat stranding and small volume of subcutaneous air and fluid. Trace ascites throughout the abdomen and pelvis, with extensive peritoneal thickening, nodularity, and omental caking (series 12, image 41). Minimal postoperative pneumoperitoneum. Musculoskeletal: No acute osseous findings. IMPRESSION: 1. Trace ascites throughout the abdomen and pelvis, with extensive peritoneal thickening, nodularity, and omental caking. Findings are consistent with widespread peritoneal and omental metastatic disease. 2. No evidence of primary malignancy in the chest, abdomen, or pelvis, with specific attention to the most likely etiologies of peritoneal  metastatic disease including pancreatic, ovarian,  and endometrial malignancy. Although there is no obvious mass, pelvic ultrasound may be helpful to more closely assess the female reproductive organs. 3. Small left pleural effusion and associated atelectasis or consolidation, nonspecific although worrisome for malignant effusion. No overt pleural thickening or nodularity. 4. Scattered areas of nonspecific infectious or inflammatory alveolar ground-glass airspace opacity throughout the lungs. 5. Cholelithiasis without evidence of acute cholecystitis. 6. Nonobstructive right nephrolithiasis. 7. Postoperative findings of same day laparoscopy including minimal pneumoperitoneum. Aortic Atherosclerosis (ICD10-I70.0). Electronically Signed   By: Delanna Ahmadi M.D.   On: 11/13/2021 17:19   CT CHEST W CONTRAST  Result Date: 11/13/2021 CLINICAL DATA:  Peritoneal and omental metastatic disease and malignant ascites identified at laparotomy for cholecystectomy * Tracking Code: BO * EXAM: CT CHEST WITH CONTRAST CT ABDOMEN AND PELVIS WITH AND WITHOUT CONTRAST TECHNIQUE: Multidetector CT imaging of the chest was performed during intravenous contrast administration. Multidetector CT imaging of the abdomen and pelvis was performed following the standard protocol before and during bolus administration of intravenous contrast. RADIATION DOSE REDUCTION: This exam was performed according to the departmental dose-optimization program which includes automated exposure control, adjustment of the mA and/or kV according to patient size and/or use of iterative reconstruction technique. CONTRAST:  17m OMNIPAQUE IOHEXOL 300 MG/ML  SOLN COMPARISON:  None Available. FINDINGS: CT CHEST FINDINGS Cardiovascular: Scattered aortic atherosclerosis. Normal heart size. No pericardial effusion. Mediastinum/Nodes: No enlarged mediastinal, hilar, or axillary lymph nodes. Thyroid gland, trachea, and esophagus demonstrate no significant findings. Lungs/Pleura: Small left pleural effusion associated  atelectasis or consolidation. Scattered areas of alveolar ground-glass airspace opacity throughout the lungs (series 14, image 50). No pleural effusion or pneumothorax. Musculoskeletal: No chest wall mass or suspicious osseous lesions identified. Chronic, callused fractures of the posterior left ribs. CT ABDOMEN PELVIS FINDINGS Hepatobiliary: No solid liver abnormality is seen. Multiple gallstones. No gallbladder wall thickening, or biliary dilatation. Pancreas: Unremarkable. No pancreatic ductal dilatation or surrounding inflammatory changes. Spleen: Normal in size without significant abnormality. Adrenals/Urinary Tract: Adrenal glands are unremarkable. Small nonobstructive calculus of the inferior pole of the right kidney (series 5, image 62). Left kidney is normal. No mass or suspicious contrast enhancement. Bladder is unremarkable. Stomach/Bowel: Stomach is within normal limits. Appendix is not clearly visualized. No evidence of bowel wall thickening, distention, or inflammatory changes. Colonic diverticulosis. Vascular/Lymphatic: Aortic atherosclerosis. Left common iliac vein stent. Enlarged left and right cardiophrenic angle lymph nodes, measuring up to 1.5 x 1.1 cm (series 12, image 67). Reproductive: No mass or other abnormality. Other: Right upper quadrant and umbilical laparoscopy ports with subcutaneous fat stranding and small volume of subcutaneous air and fluid. Trace ascites throughout the abdomen and pelvis, with extensive peritoneal thickening, nodularity, and omental caking (series 12, image 41). Minimal postoperative pneumoperitoneum. Musculoskeletal: No acute osseous findings. IMPRESSION: 1. Trace ascites throughout the abdomen and pelvis, with extensive peritoneal thickening, nodularity, and omental caking. Findings are consistent with widespread peritoneal and omental metastatic disease. 2. No evidence of primary malignancy in the chest, abdomen, or pelvis, with specific attention to the most  likely etiologies of peritoneal metastatic disease including pancreatic, ovarian, and endometrial malignancy. Although there is no obvious mass, pelvic ultrasound may be helpful to more closely assess the female reproductive organs. 3. Small left pleural effusion and associated atelectasis or consolidation, nonspecific although worrisome for malignant effusion. No overt pleural thickening or nodularity. 4. Scattered areas of nonspecific infectious or inflammatory alveolar ground-glass airspace opacity throughout the lungs. 5. Cholelithiasis without evidence  of acute cholecystitis. 6. Nonobstructive right nephrolithiasis. 7. Postoperative findings of same day laparoscopy including minimal pneumoperitoneum. Aortic Atherosclerosis (ICD10-I70.0). Electronically Signed   By: Delanna Ahmadi M.D.   On: 11/13/2021 17:19     ASSESSMENT:  High-grade serous carcinoma of peritoneum: - She had developed sharp left-sided abdominal pain since Mother's Day, occasional radiating to the back.  40 pound weight loss in the last 6 months. - Personal history of cervical cancer in 2000, status post chemo plus XRT at [facility].  She has slightly decreased hearing in the left ear since then. - US abdomen (11/08/2021): Cholelithiasis with gallbladder wall thickening and sonographic Murphy sign.  Upper abdominal ascites. - Laparoscopy (11/13/2021): Starting of the omentum as well as the upper abdominal wall and diaphragm.  Liver was within normal limits.  6 L of ascites was evacuated.  Omental and peritoneal biopsies were sent.  Peritoneal implants throughout and a significant portion of omentum and on the small intestine. - CT CAP (11/13/2021): Ascites, extensive peritoneal thickening, nodularity and omental caking.  No evidence of primary malignancy in the chest, abdomen or pelvis.  Small left pleural effusion, nonspecific.  Cholelithiasis. - US pelvis (11/14/2021): Uterus is grossly unremarkable.  Ovaries not visualized. - CA125  (11/13/2021): 1269 - Pathology: Peritoneum and omental biopsy consistent with high-grade serous carcinoma    Social/family history: - Lives at home with her husband.  Worked in Scientist, research (medical) at [employer].  Non-smoker. - Maternal grandmother with bladder cancer.  Maternal great grandmother with breast cancer.   PLAN:  High-grade serous carcinoma of peritoneum: - After first cycle, she developed diarrhea. - Imodium did not help.  Lomotil helped if she took it prior to eating.  No diarrhea in the last 1 week.  Diarrhea started after G-CSF injection. - She lost some weight, about 15 pounds after last cycle.  Part of it is likely from ascites fluid improvement. - Reviewed labs today which showed normal LFTs.  Albumin is improved to 2.5. - CBC was grossly normal. - Proceed with cycle 2 today with dose reduction of paclitaxel.  Maintain carboplatin dose.  RTC 3 weeks for follow-up.  2.  Peripheral neuropathy: - She developed numbness all over the body briefly and continuous numbness in the fingertips and feet after first cycle. - She does not have any neuropathic pains.  We will closely monitor. - We will cut back on paclitaxel dose by 20%.   Orders placed this encounter:  No orders of the defined types were placed in this encounter.    Derek Jack, MD [place] [phone]   I, Thana Ates, am acting as a scribe for Dr. Derek Jack.  I, Derek Jack MD, have reviewed the above documentation for accuracy and completeness, and I agree with the above.

## 2021-12-12 NOTE — Telephone Encounter (Signed)
Per Dr.Tucker's request,Can we please call pathology for this patient and asked that HER2 testing be added to her biopsy from last month?   I spoke to [employer] in [facility]path[facility] and she said she would add test to biopsy.

## 2021-12-12 NOTE — Patient Instructions (Signed)
[place]  Discharge Instructions: Thank you for choosing [facility] to provide your oncology and hematology care.  If you have a lab appointment with the [facility], please come in thru the Main Entrance and check in at the main information desk.  Wear comfortable clothing and clothing appropriate for easy access to any Portacath or PICC line.   We strive to give you quality time with your provider. You may need to reschedule your appointment if you arrive late (15 or more minutes).  Arriving late affects you and other patients whose appointments are after yours.  Also, if you miss three or more appointments without notifying the office, you may be dismissed from the clinic at the provider's discretion.      For prescription refill requests, have your pharmacy contact our office and allow 72 hours for refills to be completed.    Today you received the following chemotherapy and/or immunotherapy agents taxol, carboplatin    To help prevent nausea and vomiting after your treatment, we encourage you to take your nausea medication as directed.  BELOW ARE SYMPTOMS THAT SHOULD BE REPORTED IMMEDIATELY: *FEVER GREATER THAN 100.4 F (38 C) OR HIGHER *CHILLS OR SWEATING *NAUSEA AND VOMITING THAT IS NOT CONTROLLED WITH YOUR NAUSEA MEDICATION *UNUSUAL SHORTNESS OF BREATH *UNUSUAL BRUISING OR BLEEDING *URINARY PROBLEMS (pain or burning when urinating, or frequent urination) *BOWEL PROBLEMS (unusual diarrhea, constipation, pain near the anus) TENDERNESS IN MOUTH AND THROAT WITH OR WITHOUT PRESENCE OF ULCERS (sore throat, sores in mouth, or a toothache) UNUSUAL RASH, SWELLING OR PAIN  UNUSUAL VAGINAL DISCHARGE OR ITCHING   Items with * indicate a potential emergency and should be followed up as soon as possible or go to the Emergency Department if any problems should occur.  Please show the CHEMOTHERAPY ALERT CARD or IMMUNOTHERAPY ALERT CARD at check-in to the  Emergency Department and triage nurse.  Should you have questions after your visit or need to cancel or reschedule your appointment, please contact [facility] [phone]  and follow the prompts.  Office hours are 8:00 a.m. to 4:30 p.m. Monday - Friday. Please note that voicemails left after 4:00 p.m. may not be returned until the following business day.  We are closed weekends and major holidays. You have access to a nurse at all times for urgent questions. Please call the main number to the clinic [phone] and follow the prompts.  For any non-urgent questions, you may also contact your provider using MyChart. We now offer e-Visits for anyone 81 and older to request care online for non-urgent symptoms. For details visit [email or web address].   Also download the MyChart app! Go to the app store, search "MyChart", open the app, select [facility] [place], and log in with your MyChart username and password.  Masks are optional in the cancer centers. If you would like for your care team to wear a mask while they are taking care of you, please let them know. For doctor visits, patients may have with them one support person who is at least 53 years old. At this time, visitors are not allowed in the infusion area.

## 2021-12-12 NOTE — Patient Instructions (Addendum)
[place] at [facility] Discharge Instructions   You were seen and examined today by Dr. Delton Coombes.  He reviewed the results of your lab work which is normal.  We will proceed with your treatment today. We dose reduced the Taxol to help with neuropathy.   Return as scheduled in 3 weeks.    Thank you for choosing [place] at [facility] to provide your oncology and hematology care.  To afford each patient quality time with our provider, please arrive at least 15 minutes before your scheduled appointment time.   If you have a lab appointment with the [facility] please come in thru the Main Entrance and check in at the main information desk.  You need to re-schedule your appointment should you arrive 10 or more minutes late.  We strive to give you quality time with our providers, and arriving late affects you and other patients whose appointments are after yours.  Also, if you no show three or more times for appointments you may be dismissed from the clinic at the providers discretion.     Again, thank you for choosing [facility].  Our hope is that these requests will decrease the amount of time that you wait before being seen by our physicians.       _____________________________________________________________  Should you have questions after your visit to [facility], please contact our office at [phone] and follow the prompts.  Our office hours are 8:00 a.m. and 4:30 p.m. Monday - Friday.  Please note that voicemails left after 4:00 p.m. may not be returned until the following business day.  We are closed weekends and major holidays.  You do have access to a nurse 24-7, just call the main number to the clinic [phone] and do not press any options, hold on the line and a nurse will answer the phone.    For prescription refill requests, have your pharmacy contact our office and allow 72 hours.    Due  to Covid, you will need to wear a mask upon entering the hospital. If you do not have a mask, a mask will be given to you at the Main Entrance upon arrival. For doctor visits, patients may have 1 support person age 72 or older with them. For treatment visits, patients can not have anyone with them due to social distancing guidelines and our immunocompromised population.

## 2021-12-14 ENCOUNTER — Inpatient Hospital Stay (HOSPITAL_COMMUNITY): Payer: Commercial Managed Care - PPO

## 2021-12-14 VITALS — BP 124/90 | HR 104 | Temp 96.9°F | Resp 20

## 2021-12-14 DIAGNOSIS — C482 Malignant neoplasm of peritoneum, unspecified: Secondary | ICD-10-CM

## 2021-12-14 DIAGNOSIS — Z5111 Encounter for antineoplastic chemotherapy: Secondary | ICD-10-CM | POA: Diagnosis not present

## 2021-12-14 MED ORDER — PEGFILGRASTIM INJECTION 6 MG/0.6ML ~~LOC~~
6.0000 mg | PREFILLED_SYRINGE | Freq: Once | SUBCUTANEOUS | Status: AC
  Administered 2021-12-14: 6 mg via SUBCUTANEOUS
  Filled 2021-12-14: qty 0.6

## 2021-12-14 NOTE — Patient Instructions (Signed)
[place]  Discharge Instructions: Thank you for choosing [facility] to provide your oncology and hematology care.  If you have a lab appointment with the [facility], please come in thru the Main Entrance and check in at the main information desk.  Wear comfortable clothing and clothing appropriate for easy access to any Portacath or PICC line.   We strive to give you quality time with your provider. You may need to reschedule your appointment if you arrive late (15 or more minutes).  Arriving late affects you and other patients whose appointments are after yours.  Also, if you miss three or more appointments without notifying the office, you may be dismissed from the clinic at the provider's discretion.      For prescription refill requests, have your pharmacy contact our office and allow 72 hours for refills to be completed.    Today you received the following chemotherapy and/or immunotherapy agents Neulasta injection   BELOW ARE SYMPTOMS THAT SHOULD BE REPORTED IMMEDIATELY: *FEVER GREATER THAN 100.4 F (38 C) OR HIGHER *CHILLS OR SWEATING *NAUSEA AND VOMITING THAT IS NOT CONTROLLED WITH YOUR NAUSEA MEDICATION *UNUSUAL SHORTNESS OF BREATH *UNUSUAL BRUISING OR BLEEDING *URINARY PROBLEMS (pain or burning when urinating, or frequent urination) *BOWEL PROBLEMS (unusual diarrhea, constipation, pain near the anus) TENDERNESS IN MOUTH AND THROAT WITH OR WITHOUT PRESENCE OF ULCERS (sore throat, sores in mouth, or a toothache) UNUSUAL RASH, SWELLING OR PAIN  UNUSUAL VAGINAL DISCHARGE OR ITCHING   Items with * indicate a potential emergency and should be followed up as soon as possible or go to the Emergency Department if any problems should occur.  Please show the CHEMOTHERAPY ALERT CARD or IMMUNOTHERAPY ALERT CARD at check-in to the Emergency Department and triage nurse.  Should you have questions after your visit or need to cancel or reschedule your  appointment, please contact [facility] [phone]  and follow the prompts.  Office hours are 8:00 a.m. to 4:30 p.m. Monday - Friday. Please note that voicemails left after 4:00 p.m. may not be returned until the following business day.  We are closed weekends and major holidays. You have access to a nurse at all times for urgent questions. Please call the main number to the clinic [phone] and follow the prompts.  For any non-urgent questions, you may also contact your provider using MyChart. We now offer e-Visits for anyone 31 and older to request care online for non-urgent symptoms. For details visit [email or web address].   Also download the MyChart app! Go to the app store, search "MyChart", open the app, select [place], and log in with your MyChart username and password.  Masks are optional in the cancer centers. If you would like for your care team to wear a mask while they are taking care of you, please let them know. For doctor visits, patients may have with them one support person who is at least 53 years old. At this time, visitors are not allowed in the infusion area.

## 2021-12-14 NOTE — Progress Notes (Signed)
Nicole Santos presents today for injection per the provider's orders.  Neulasta administration without incident; injection site WNL; see MAR for injection details.  Patient tolerated procedure well and without incident.  No questions or complaints noted at this time.   Discharged from clinic via wheelchair in stable condition. Alert and oriented x 3. F/U with [facility] as scheduled.

## 2021-12-18 LAB — SURGICAL PATHOLOGY

## 2021-12-19 ENCOUNTER — Telehealth: Payer: Self-pay | Admitting: Licensed Clinical Social Worker

## 2021-12-19 NOTE — Telephone Encounter (Signed)
Per 7/18 phone line pt called and requested to cancel appointment.  She not want to R/S.  Canceled per pt request

## 2021-12-20 ENCOUNTER — Other Ambulatory Visit: Payer: Self-pay | Admitting: Gynecologic Oncology

## 2021-12-20 DIAGNOSIS — C579 Malignant neoplasm of female genital organ, unspecified: Secondary | ICD-10-CM

## 2021-12-20 DIAGNOSIS — C800 Disseminated malignant neoplasm, unspecified: Secondary | ICD-10-CM

## 2021-12-21 ENCOUNTER — Encounter: Payer: Self-pay | Admitting: Family Medicine

## 2021-12-25 ENCOUNTER — Other Ambulatory Visit: Payer: Self-pay

## 2021-12-25 ENCOUNTER — Encounter: Payer: Commercial Managed Care - PPO | Admitting: Licensed Clinical Social Worker

## 2021-12-25 ENCOUNTER — Other Ambulatory Visit: Payer: Commercial Managed Care - PPO

## 2021-12-26 ENCOUNTER — Encounter: Payer: Self-pay | Admitting: Family Medicine

## 2021-12-26 ENCOUNTER — Ambulatory Visit: Payer: Commercial Managed Care - PPO | Admitting: Family Medicine

## 2021-12-26 VITALS — BP 117/78 | HR 115 | Temp 97.6°F | Ht 63.0 in | Wt 203.8 lb

## 2021-12-26 DIAGNOSIS — E1165 Type 2 diabetes mellitus with hyperglycemia: Secondary | ICD-10-CM

## 2021-12-26 DIAGNOSIS — E1169 Type 2 diabetes mellitus with other specified complication: Secondary | ICD-10-CM

## 2021-12-26 DIAGNOSIS — E785 Hyperlipidemia, unspecified: Secondary | ICD-10-CM

## 2021-12-26 DIAGNOSIS — I10 Essential (primary) hypertension: Secondary | ICD-10-CM | POA: Diagnosis not present

## 2021-12-26 DIAGNOSIS — C482 Malignant neoplasm of peritoneum, unspecified: Secondary | ICD-10-CM

## 2021-12-26 DIAGNOSIS — B3731 Acute candidiasis of vulva and vagina: Secondary | ICD-10-CM

## 2021-12-26 MED ORDER — FLUCONAZOLE 150 MG PO TABS: 150.0000 mg | ORAL_TABLET | Freq: Once | ORAL | 0 refills | Status: AC

## 2021-12-26 NOTE — Progress Notes (Signed)
Assessment & Plan:  1. Type 2 diabetes mellitus with hyperglycemia, without long-term current use of insulin (HCC) Lab Results  Component Value Date   HGBA1C 5.4 11/13/2021   HGBA1C 5.7 (H) 09/19/2021   HGBA1C 6.0 (H) 06/21/2021    - Diabetes is at goal of A1c < 7. Plan to re-check A1c in 6 weeks when it has been three months from her last and when she stopped Ozempic. - Medications:  okay to discontinue Ozempic. She has had a significant weight loss due to chemotherapy. Encouraged to monitor blood sugar more frequently at home and let me know if fasting is staying >150 or post prandial is staying >180 and we will start something different for diabetes that will not effect her weight or appetite - Home glucose monitoring: encouraged to monitor more frequently - Patient is currently taking a statin. Patient is not taking an ACE-inhibitor/ARB.  - Instruction/counseling given: reminded to get eye exam  Diabetes Health Maintenance Due  Topic Date Due   OPHTHALMOLOGY EXAM  Never done   URINE MICROALBUMIN  03/28/2022   HEMOGLOBIN A1C  05/15/2022   FOOT EXAM  06/21/2022    Lab Results  Component Value Date   LABMICR See below: 11/03/2021   LABMICR <3.0 03/28/2021    2. Essential hypertension Well controlled without medication.  3. Hyperlipidemia associated with type 2 diabetes mellitus ([place]) Well controlled on current regimen.   4. Morbid obesity  Encouraged healthy eating as much as possible. Ozempic discontinued.   5. Primary peritoneal carcinomatosis ([place]) Managed by oncology.  6. Vaginal yeast infection - fluconazole (DIFLUCAN) 150 MG tablet; Take 1 tablet (150 mg total) by mouth once for 1 dose. May repeat after 3 days if needed.  Dispense: 2 tablet; Refill: 0   Return in about 6 weeks (around 02/06/2022) for DM.  Hendricks Limes, MSN, APRN, FNP-C [facility]  Subjective:    Patient ID: Nicole Santos, female    DOB: [birth year], 51 y.o.   MRN:  [id number]  Patient Care Team: Loman Brooklyn, FNP as PCP - General (Family Medicine) Harlen Labs, MD as Referring Physician (Optometry) Derek Jack, MD as Medical Oncologist (Medical Oncology) Brien Mates, RN as Oncology Nurse Navigator (Medical Oncology)   Chief Complaint:  Chief Complaint  Patient presents with   Medical Management of Chronic Issues   possible yeast infection     HPI: Nicole Santos is a 51 y.o. female presenting on 12/26/2021 for Medical Management of Chronic Issues and possible yeast infection   Patient is accompanied by her daughter, who she is okay with being present.   Obesity: She was increased to 2 mg Ozempic at her last visit to improve glycemic control and weight loss. Since that time, she has been diagnosed with primary peritoneal carcinomatosis and started chemotherapy treatments which have made eating very difficult.  She has lost 28 lbs since she was diagnosed.  Her oncologist advised her to try not to lose anymore weight, so she took herself off of Ozempic since it makes it difficult for her to eat and contributes to her weight loss.  She has been off the Ozempic for 6 weeks now.  Diabetes: Patient presents for follow up of diabetes. Current symptoms include: none. Known diabetic complications: cardiovascular disease. Medication compliance: No, patient took herself off of Ozempic (see above). Current diet:  eating very little due to chemotherapy . Current exercise: none. Home blood sugar records:  does not consisently check,  but did check it fasting two days ago at which time it was 116 . Is she  on ACE inhibitor or angiotensin II receptor blocker? No, she was taking Lisinopril, but this was discontinued by oncology due to low blood pressures. Is she on a statin? Yes (Atorvastatin).   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 This has worsened since being diagnosed with cancer, but she does not feel she  needs a medication to treat. Previously failed treatment with Zoloft. Patient declined completing PHQ-9 and GAD-7.     11/03/2021    3:33 PM 09/19/2021   11:24 AM 07/13/2021    4:20 PM  Depression screen PHQ 2/9  Decreased Interest 2 3 0  Down, Depressed, Hopeless _0 PHQ - 2 Score _1 Altered sleeping _2 Tired, decreased energy _3 Change in appetite 1 3 0  Feeling bad or failure about yourself  0 1 1  Trouble concentrating 2 0 0  Moving slowly or fidgety/restless 0 2 0  Suicidal thoughts 0 0 0  PHQ-9 Score _4 Difficult doing work/chores Somewhat difficult Somewhat difficult Not difficult at all      11/03/2021    3:34 PM 09/19/2021   11:25 AM 07/13/2021    4:20 PM 06/21/2021   10:05 AM  GAD 7 : Generalized Anxiety Score  Nervous, Anxious, on Edge 0 0 0 1  Control/stop worrying _5 Worry too much - different things _6 Trouble relaxing 1 2 0 0  Restless 0 0 0 0  Easily annoyed or irritable 1 3 0 2  Afraid - awful might happen 0 _7 Total GAD 7 Score _8 Anxiety Difficulty  Somewhat difficult Not difficult at all Not difficult at all    New complaints: Patient reports vaginal itching and burning consistent with previous yeast infections. She has tried OTC Monistat but states it is really messy and she has enough going on with chemo treatments.   Social history:  Relevant past medical, surgical, family and social history reviewed and updated as indicated. Interim medical history since our last visit reviewed.  Allergies and medications reviewed and updated.  DATA REVIEWED: CHART IN EPIC  ROS: Negative unless specifically indicated above in HPI.    Current Outpatient Medications:    aspirin EC 81 MG tablet, Take 81 mg by mouth daily., Disp: , Rfl:    atorvastatin (LIPITOR) 20 MG tablet, Take 1 tablet (20 mg total) by mouth daily., Disp: 90 tablet, Rfl: 1   CARBOplatin 1000 MG/100ML SOLN, Inject into the vein every 21 ( twenty-one)  days., Disp: , Rfl:    cimetidine (TAGAMET) 200 MG tablet, Take 200 mg by mouth daily., Disp: , Rfl:    PACLitaxel (TAXOL IV), Inject into the vein every 21 ( twenty-one) days., Disp: , Rfl:    diphenoxylate-atropine (LOMOTIL) 2.5-0.025 MG tablet, Take 1 tablet by mouth 4 (four) times daily as needed for diarrhea or loose stools. (Patient not taking: Reported on 12/26/2021), Disp: 30 tablet, Rfl: 3   furosemide (LASIX) 20 MG tablet, Take 1 tablet (20 mg total) by mouth daily as needed. Take daily in the morning as needed for lower extremity swelling (Patient not taking: Reported on 12/26/2021), Disp: 30 tablet, Rfl: 2   HYDROcodone-acetaminophen (NORCO) 10-325 MG tablet, Take 1 tablet by mouth every  6 (six) hours as needed. (Patient not taking: Reported on 12/26/2021), Disp: 25 tablet, Rfl: 0   lidocaine-prilocaine (EMLA) cream, Apply to affected area once (Patient not taking: Reported on 12/12/2021), Disp: 30 g, Rfl: 3   ondansetron (ZOFRAN) 4 MG tablet, Take 1 tablet (4 mg total) by mouth every 8 (eight) hours as needed for nausea or vomiting. (Patient not taking: Reported on 12/12/2021), Disp: 20 tablet, Rfl: 1   prochlorperazine (COMPAZINE) 10 MG tablet, Take 1 tablet (10 mg total) by mouth every 6 (six) hours as needed (Nausea or vomiting). (Patient not taking: Reported on 12/12/2021), Disp: 30 tablet, Rfl: 1   Semaglutide, 2 MG/DOSE, 8 MG/3ML SOPN, Inject 2 mg as directed once a week. (Patient not taking: Reported on 12/26/2021), Disp: 9 mL, Rfl: 1   Allergies  Allergen Reactions   Augmentin [Amoxicillin-Pot Clavulanate] Diarrhea and Nausea And Vomiting   Doxycycline     Abdominal cramps.   Topamax [Topiramate] Nausea And Vomiting   Past Medical History:  Diagnosis Date   Acute respiratory disease due to COVID-19 virus 02/02/2019   Anxiety    Cancer ([place])    Depression    Diabetes mellitus without complication ([place])    Dyspnea    Dysrhythmia    Hypertension    Ovarian cancer ([place])  11/14/2021   Sleep apnea    doesn't use CPAP   Tachycardia     Past Surgical History:  Procedure Laterality Date   KNEE SURGERY Right    Arthroscopic   LAPAROSCOPIC ABDOMINAL EXPLORATION N/A 11/13/2021   Procedure: Exploratory laparoscopy;  Surgeon: Aviva Signs, MD;  Location: [facility];  Service: General;  Laterality: N/A;   PORTACATH PLACEMENT Left 11/15/2021   Procedure: INSERTION PORT-A-CATH;  Surgeon: Aviva Signs, MD;  Location: [facility];  Service: General;  Laterality: Left;   TONSILLECTOMY AND ADENOIDECTOMY      Social History   Socioeconomic History   Marital status: Married    Spouse name: Not on file   Number of children: Not on file   Years of education: Not on file   Highest education level: Not on file  Occupational History   Occupation: Unemployed  Tobacco Use   Smoking status: Never   Smokeless tobacco: Never  Vaping Use   Vaping Use: Never used  Substance and Sexual Activity   Alcohol use: No   Drug use: No   Sexual activity: Not Currently  Other Topics Concern   Not on file  Social History Narrative   Not on file   Social Determinants of Health   Financial Resource Strain: Not on file  Food Insecurity: Not on file  Transportation Needs: Not on file  Physical Activity: Not on file  Stress: Not on file  Social Connections: Not on file  Intimate Partner Violence: Not on file        Objective:    BP 117/78   Pulse (!) 115   Temp 97.6 F (36.4 C) (Temporal)   Ht $R'5\' 3"'em$  (1.6 m)   Wt 203 lb 12.8 oz (92.4 kg)   SpO2 99%   BMI 36.10 kg/m   Wt Readings from Last 3 Encounters:  12/26/21 203 lb 12.8 oz (92.4 kg)  12/12/21 218 lb 12.8 oz (99.2 kg)  12/11/21 220 lb (99.8 kg)    Physical Exam Vitals reviewed.  Constitutional:      General: She is not in acute distress.    Appearance: Normal appearance. She is morbidly obese. She is not ill-appearing, toxic-appearing or  diaphoretic.  HENT:     Head: Normocephalic and atraumatic.  Eyes:      General: No scleral icterus.       Right eye: No discharge.        Left eye: No discharge.     Conjunctiva/sclera: Conjunctivae normal.  Cardiovascular:     Rate and Rhythm: Normal rate and regular rhythm.     Heart sounds: Normal heart sounds. No murmur heard.    No friction rub. No gallop.  Pulmonary:     Effort: Pulmonary effort is normal. No respiratory distress.     Breath sounds: Normal breath sounds. No stridor. No wheezing, rhonchi or rales.  Musculoskeletal:        General: Normal range of motion.     Cervical back: Normal range of motion.  Skin:    General: Skin is warm and dry.     Capillary Refill: Capillary refill takes less than 2 seconds.  Neurological:     General: No focal deficit present.     Mental Status: She is alert and oriented to person, place, and time. Mental status is at baseline.     Motor: Weakness present.     Gait: Gait abnormal (riding in [facility]).  Psychiatric:        Mood and Affect: Mood normal.        Behavior: Behavior normal.        Thought Content: Thought content normal.        Judgment: Judgment normal.     Lab Results  Component Value Date   TSH 0.949 02/19/2020   Lab Results  Component Value Date   WBC 12.2 (H) 12/12/2021   HGB 10.9 (L) 12/12/2021   HCT 34.1 (L) 12/12/2021   MCV 86.1 12/12/2021   PLT 665 (H) 12/12/2021   Lab Results  Component Value Date   NA 136 12/12/2021   K 3.8 12/12/2021   CO2 25 12/12/2021   GLUCOSE 124 (H) 12/12/2021   BUN 8 12/12/2021   CREATININE 0.70 12/12/2021   BILITOT 1.0 12/12/2021   ALKPHOS 67 12/12/2021   AST 22 12/12/2021   ALT 16 12/12/2021   PROT 6.7 12/12/2021   ALBUMIN 2.5 (L) 12/12/2021   CALCIUM 8.3 (L) 12/12/2021   ANIONGAP 9 12/12/2021   EGFR 85 09/19/2021   Lab Results  Component Value Date   CHOL 141 09/19/2021   Lab Results  Component Value Date   HDL 35 (L) 09/19/2021   Lab Results  Component Value Date   LDLCALC 83 09/19/2021   Lab Results  Component Value Date    TRIG 130 09/19/2021   Lab Results  Component Value Date   CHOLHDL 4.0 09/19/2021   Lab Results  Component Value Date   HGBA1C 5.4 11/13/2021

## 2022-01-02 ENCOUNTER — Inpatient Hospital Stay: Payer: Commercial Managed Care - PPO | Attending: Gynecologic Oncology | Admitting: Hematology

## 2022-01-02 ENCOUNTER — Inpatient Hospital Stay: Payer: Commercial Managed Care - PPO

## 2022-01-02 VITALS — BP 115/85 | HR 115 | Temp 97.2°F | Resp 19

## 2022-01-02 VITALS — BP 117/58 | HR 90 | Temp 97.0°F | Resp 18

## 2022-01-02 DIAGNOSIS — C482 Malignant neoplasm of peritoneum, unspecified: Secondary | ICD-10-CM

## 2022-01-02 DIAGNOSIS — Z5111 Encounter for antineoplastic chemotherapy: Secondary | ICD-10-CM | POA: Insufficient documentation

## 2022-01-02 DIAGNOSIS — R634 Abnormal weight loss: Secondary | ICD-10-CM | POA: Diagnosis not present

## 2022-01-02 DIAGNOSIS — K5900 Constipation, unspecified: Secondary | ICD-10-CM | POA: Insufficient documentation

## 2022-01-02 DIAGNOSIS — Z5189 Encounter for other specified aftercare: Secondary | ICD-10-CM | POA: Diagnosis not present

## 2022-01-02 DIAGNOSIS — R531 Weakness: Secondary | ICD-10-CM | POA: Diagnosis not present

## 2022-01-02 DIAGNOSIS — G620 Drug-induced polyneuropathy: Secondary | ICD-10-CM | POA: Diagnosis not present

## 2022-01-02 DIAGNOSIS — Z923 Personal history of irradiation: Secondary | ICD-10-CM | POA: Diagnosis not present

## 2022-01-02 DIAGNOSIS — R197 Diarrhea, unspecified: Secondary | ICD-10-CM | POA: Diagnosis not present

## 2022-01-02 DIAGNOSIS — R000 Tachycardia, unspecified: Secondary | ICD-10-CM | POA: Diagnosis not present

## 2022-01-02 DIAGNOSIS — E876 Hypokalemia: Secondary | ICD-10-CM | POA: Insufficient documentation

## 2022-01-02 DIAGNOSIS — Z8541 Personal history of malignant neoplasm of cervix uteri: Secondary | ICD-10-CM | POA: Diagnosis not present

## 2022-01-02 LAB — COMPREHENSIVE METABOLIC PANEL
ALT: 14 U/L (ref 0–44)
AST: 21 U/L (ref 15–41)
Albumin: 2.9 g/dL — ABNORMAL LOW (ref 3.5–5.0)
Alkaline Phosphatase: 54 U/L (ref 38–126)
Anion gap: 11 (ref 5–15)
BUN: 5 mg/dL — ABNORMAL LOW (ref 6–20)
CO2: 25 mmol/L (ref 22–32)
Calcium: 8.4 mg/dL — ABNORMAL LOW (ref 8.9–10.3)
Chloride: 98 mmol/L (ref 98–111)
Creatinine, Ser: 0.57 mg/dL (ref 0.44–1.00)
GFR, Estimated: >60 mL/min (ref >60)
Glucose, Bld: 94 mg/dL (ref 70–99)
Potassium: 3.3 mmol/L — ABNORMAL LOW (ref 3.5–5.1)
Sodium: 134 mmol/L — ABNORMAL LOW (ref 135–145)
Total Bilirubin: 1.1 mg/dL (ref 0.3–1.2)
Total Protein: 7.3 g/dL (ref 6.5–8.1)

## 2022-01-02 LAB — CBC WITH DIFFERENTIAL/PLATELET
Abs Immature Granulocytes: 0.04 10*3/uL (ref 0.00–0.07)
Basophils Absolute: 0 10*3/uL (ref 0.0–0.1)
Basophils Relative: 1 %
Eosinophils Absolute: 0.1 10*3/uL (ref 0.0–0.5)
Eosinophils Relative: 2 %
HCT: 31.1 % — ABNORMAL LOW (ref 36.0–46.0)
Hemoglobin: 9.8 g/dL — ABNORMAL LOW (ref 12.0–15.0)
Immature Granulocytes: 1 %
Lymphocytes Relative: 17 %
Lymphs Abs: 0.8 10*3/uL (ref 0.7–4.0)
MCH: 27.1 pg (ref 26.0–34.0)
MCHC: 31.5 g/dL (ref 30.0–36.0)
MCV: 86.1 fL (ref 80.0–100.0)
Monocytes Absolute: 0.5 10*3/uL (ref 0.1–1.0)
Monocytes Relative: 11 %
Neutro Abs: 3.3 10*3/uL (ref 1.7–7.7)
Neutrophils Relative %: 68 %
Platelets: 382 10*3/uL (ref 150–400)
RBC: 3.61 MIL/uL — ABNORMAL LOW (ref 3.87–5.11)
RDW: 17.1 % — ABNORMAL HIGH (ref 11.5–15.5)
WBC: 4.7 10*3/uL (ref 4.0–10.5)
nRBC: 0 % (ref 0.0–0.2)

## 2022-01-02 LAB — MAGNESIUM: Magnesium: 1.5 mg/dL — ABNORMAL LOW (ref 1.7–2.4)

## 2022-01-02 MED ORDER — HEPARIN SOD (PORK) LOCK FLUSH 100 UNIT/ML IV SOLN
500.0000 [IU] | Freq: Once | INTRAVENOUS | Status: AC | PRN
  Administered 2022-01-02: 500 [IU]

## 2022-01-02 MED ORDER — SODIUM CHLORIDE 0.9 % IV SOLN
718.0000 mg | Freq: Once | INTRAVENOUS | Status: AC
  Administered 2022-01-02: 720 mg via INTRAVENOUS
  Filled 2022-01-02: qty 72

## 2022-01-02 MED ORDER — SODIUM CHLORIDE 0.9 % IV SOLN
140.0000 mg/m2 | Freq: Once | INTRAVENOUS | Status: AC
  Administered 2022-01-02: 282 mg via INTRAVENOUS
  Filled 2022-01-02 (×2): qty 47

## 2022-01-02 MED ORDER — PALONOSETRON HCL INJECTION 0.25 MG/5ML
0.2500 mg | Freq: Once | INTRAVENOUS | Status: AC
  Administered 2022-01-02: 0.25 mg via INTRAVENOUS
  Filled 2022-01-02: qty 5

## 2022-01-02 MED ORDER — MAGNESIUM SULFATE 2 GM/50ML IV SOLN
2.0000 g | Freq: Once | INTRAVENOUS | Status: AC
  Administered 2022-01-02: 2 g via INTRAVENOUS
  Filled 2022-01-02: qty 50

## 2022-01-02 MED ORDER — SODIUM CHLORIDE 0.9 % IV SOLN: Freq: Once | INTRAVENOUS | Status: AC

## 2022-01-02 MED ORDER — SODIUM CHLORIDE 0.9 % IV SOLN
10.0000 mg | Freq: Once | INTRAVENOUS | Status: AC
  Administered 2022-01-02: 10 mg via INTRAVENOUS
  Filled 2022-01-02: qty 10

## 2022-01-02 MED ORDER — SODIUM CHLORIDE 0.9 % IV SOLN
150.0000 mg | Freq: Once | INTRAVENOUS | Status: AC
  Administered 2022-01-02: 150 mg via INTRAVENOUS
  Filled 2022-01-02: qty 150

## 2022-01-02 MED ORDER — MAGNESIUM OXIDE -MG SUPPLEMENT 400 (240 MG) MG PO TABS: 400.0000 mg | ORAL_TABLET | Freq: Two times a day (BID) | ORAL | 2 refills | Status: DC

## 2022-01-02 MED ORDER — SODIUM CHLORIDE 0.9% FLUSH
10.0000 mL | INTRAVENOUS | Status: DC | PRN
  Administered 2022-01-02: 10 mL

## 2022-01-02 MED ORDER — DIPHENHYDRAMINE HCL 50 MG/ML IJ SOLN
50.0000 mg | Freq: Once | INTRAMUSCULAR | Status: AC
  Administered 2022-01-02: 50 mg via INTRAVENOUS
  Filled 2022-01-02: qty 1

## 2022-01-02 MED ORDER — FAMOTIDINE IN NACL 20-0.9 MG/50ML-% IV SOLN
20.0000 mg | Freq: Once | INTRAVENOUS | Status: AC
  Administered 2022-01-02: 20 mg via INTRAVENOUS
  Filled 2022-01-02: qty 50

## 2022-01-02 MED ORDER — POTASSIUM CHLORIDE CRYS ER 20 MEQ PO TBCR
40.0000 meq | EXTENDED_RELEASE_TABLET | Freq: Once | ORAL | Status: AC
  Administered 2022-01-02: 40 meq via ORAL
  Filled 2022-01-02: qty 2

## 2022-01-02 NOTE — Patient Instructions (Signed)
[place] at [facility] Discharge Instructions   You were seen and examined today by Dr. Delton Coombes.  He reviewed the results of your lab work. Your magnesium is low. We will give you IV magnesium in the clinic today. We also sent a prescription to your pharmacy for magnesium to take twice a day.  We will proceed with your treatment today.  We will repeat a CT scan prior to your next visit.   Return as scheduled.    Thank you for choosing [place] at [facility] to provide your oncology and hematology care.  To afford each patient quality time with our provider, please arrive at least 15 minutes before your scheduled appointment time.   If you have a lab appointment with the [facility] please come in thru the Main Entrance and check in at the main information desk.  You need to re-schedule your appointment should you arrive 10 or more minutes late.  We strive to give you quality time with our providers, and arriving late affects you and other patients whose appointments are after yours.  Also, if you no show three or more times for appointments you may be dismissed from the clinic at the providers discretion.     Again, thank you for choosing [facility]  Our hope is that these requests will decrease the amount of time that you wait before being seen by our physicians.       _____________________________________________________________  Should you have questions after your visit to [facility], please contact our office at [phone] and follow the prompts.  Our office hours are 8:00 a.m. and 4:30 p.m. Monday - Friday.  Please note that voicemails left after 4:00 p.m. may not be returned until the following business day.  We are closed weekends and major holidays.  You do have access to a nurse 24-7, just call the main number to the clinic [phone] and do not press any options, hold on the line and a nurse  will answer the phone.    For prescription refill requests, have your pharmacy contact our office and allow 72 hours.    Due to Covid, you will need to wear a mask upon entering the hospital. If you do not have a mask, a mask will be given to you at the Main Entrance upon arrival. For doctor visits, patients may have 1 support person age 42 or older with them. For treatment visits, patients can not have anyone with them due to social distancing guidelines and our immunocompromised population.

## 2022-01-02 NOTE — Progress Notes (Signed)
Patient presents today for chemotherapy infusion.  Patient is in satisfactory condition with no complaints voiced.  Vital signs are stable.  Labs reviewed by Dr. Delton Coombes during her office visit.  All labs are within treatment parameters. Magnesium today is 1.6 and potassium is 3.3.  Patient will receive Magnesium 2 grams IV over one hour x one dose and Klor Con 40 mEq x one dose today per standing orders from Dr. Delton Coombes.  We will proceed with treatment per MD orders.   Patient tolerated treatment well with no complaints voiced.  Patient left via wheelchair with son in stable condition.  Vital signs stable at discharge.  Follow up as scheduled.

## 2022-01-02 NOTE — Progress Notes (Signed)
[place], [place] [address]   CLINIC:  Medical Oncology/Hematology  PCP:  Loman Brooklyn, [address] / [place] [address] [phone]   REASON FOR VISIT:  Follow-up for high-grade serous carcinoma of peritoneum  PRIOR THERAPY: none  NGS Results: not done  CURRENT THERAPY: Carboplatin (AUC 6) / Paclitaxel (175) q21d x 6 cycles  BRIEF ONCOLOGIC HISTORY:  Oncology History  Primary peritoneal carcinomatosis ([place])  11/17/2021 Initial Diagnosis   Primary peritoneal carcinomatosis ([place])   11/21/2021 -  Chemotherapy   Patient is on Treatment Plan : OVARIAN Carboplatin (AUC 6) / Paclitaxel (175) q21d x 6 cycles       CANCER STAGING:  Cancer Staging  Primary peritoneal carcinomatosis ([place]) Staging form: Ovary, Fallopian Tube, and Primary Peritoneal Carcinoma, AJCC 8th Edition - Clinical stage from 11/17/2021: FIGO Stage IIIC (cT3c, cM0) - Unsigned   INTERVAL HISTORY:  [name], a 51 y.o. female, returns for routine follow-up and consideration for next cycle of chemotherapy. Nicole Santos was last seen on 12/12/2021.  Due for cycle #3 of Carboplatin and Taxol today.   Overall, she tells me she has been feeling pretty well. She reports numbness in her face for 2 days following treatment. She reports constant numbness in her fingertips and toes. She reports mild diarrhea. Her abdominal distention has improved. She has lost 32 lbs since her last visit. She is not drinking Boot/Ensure as she reports she does not like the taste.   Overall, she feels ready for next cycle of chemo today.    REVIEW OF SYSTEMS:  Review of Systems  Constitutional:  Positive for appetite change, fatigue and unexpected weight change (-32 lbs).  Gastrointestinal:  Positive for constipation and diarrhea (mild). Negative for abdominal distention (improved).  Genitourinary:  Positive for frequency.   Neurological:  Positive for numbness.   Psychiatric/Behavioral:  Positive for sleep disturbance.   All other systems reviewed and are negative.   PAST MEDICAL/SURGICAL HISTORY:  Past Medical History:  Diagnosis Date   Acute respiratory disease due to COVID-19 virus 02/02/2019   Anxiety    Cancer ([place])    Depression    Diabetes mellitus without complication (H[facility])    Dyspnea    Dysrhythmia    Hypertension    Ovarian cancer ([place]) 11/14/2021   Sleep apnea    doesn't use CPAP   Tachycardia    Past Surgical History:  Procedure Laterality Date   KNEE SURGERY Right    Arthroscopic   LAPAROSCOPIC ABDOMINAL EXPLORATION N/A 11/13/2021   Procedure: Exploratory laparoscopy;  Surgeon: Aviva Signs, MD;  Location: [facility];  Service: General;  Laterality: N/A;   PORTACATH PLACEMENT Left 11/15/2021   Procedure: INSERTION PORT-A-CATH;  Surgeon: Aviva Signs, MD;  Location: [facility];  Service: General;  Laterality: Left;   TONSILLECTOMY AND ADENOIDECTOMY      SOCIAL HISTORY:  Social History   Socioeconomic History   Marital status: Married    Spouse name: Not on file   Number of children: Not on file   Years of education: Not on file   Highest education level: Not on file  Occupational History   Occupation: Unemployed  Tobacco Use   Smoking status: Never   Smokeless tobacco: Never  Vaping Use   Vaping Use: Never used  Substance and Sexual Activity   Alcohol use: No   Drug use: No   Sexual activity: Not Currently  Other Topics Concern   Not on  file  Social History Narrative   Not on file   Social Determinants of Health   Financial Resource Strain: Not on file  Food Insecurity: Not on file  Transportation Needs: Not on file  Physical Activity: Not on file  Stress: Not on file  Social Connections: Not on file  Intimate Partner Violence: Not on file    FAMILY HISTORY:  Family History  Problem Relation Age of Onset   Hypertension Mother    Bladder Cancer Maternal Grandmother    Breast cancer Maternal  Great-grandmother    Colon cancer Neg Hx    Ovarian cancer Neg Hx    Endometrial cancer Neg Hx    Pancreatic cancer Neg Hx    Prostate cancer Neg Hx     CURRENT MEDICATIONS:  Current Outpatient Medications  Medication Sig Dispense Refill   aspirin EC 81 MG tablet Take 81 mg by mouth daily.     atorvastatin (LIPITOR) 20 MG tablet Take 1 tablet (20 mg total) by mouth daily. 90 tablet 1   CARBOplatin 1000 MG/100ML SOLN Inject into the vein every 21 ( twenty-one) days.     cimetidine (TAGAMET) 200 MG tablet Take 200 mg by mouth daily.     diphenoxylate-atropine (LOMOTIL) 2.5-0.025 MG tablet Take 1 tablet by mouth 4 (four) times daily as needed for diarrhea or loose stools. (Patient not taking: Reported on 12/26/2021) 30 tablet 3   furosemide (LASIX) 20 MG tablet Take 1 tablet (20 mg total) by mouth daily as needed. Take daily in the morning as needed for lower extremity swelling (Patient not taking: Reported on 12/26/2021) 30 tablet 2   HYDROcodone-acetaminophen (NORCO) 10-325 MG tablet Take 1 tablet by mouth every 6 (six) hours as needed. (Patient not taking: Reported on 12/26/2021) 25 tablet 0   lidocaine-prilocaine (EMLA) cream Apply to affected area once (Patient not taking: Reported on 12/12/2021) 30 g 3   ondansetron (ZOFRAN) 4 MG tablet Take 1 tablet (4 mg total) by mouth every 8 (eight) hours as needed for nausea or vomiting. (Patient not taking: Reported on 12/12/2021) 20 tablet 1   PACLitaxel (TAXOL IV) Inject into the vein every 21 ( twenty-one) days.     prochlorperazine (COMPAZINE) 10 MG tablet Take 1 tablet (10 mg total) by mouth every 6 (six) hours as needed (Nausea or vomiting). (Patient not taking: Reported on 12/12/2021) 30 tablet 1   No current facility-administered medications for this visit.    ALLERGIES:  Allergies  Allergen Reactions   Augmentin [Amoxicillin-Pot Clavulanate] Diarrhea and Nausea And Vomiting   Doxycycline     Abdominal cramps.   Topamax [Topiramate] Nausea  And Vomiting    PHYSICAL EXAM:  Performance status (ECOG): 1 - Symptomatic but completely ambulatory  Vitals:   01/02/22 0833  BP: 115/85  Pulse: (!) 115  Resp: 19  Temp: (!) 97.2 F (36.2 C)  SpO2: 97%   Wt Readings from Last 3 Encounters:  12/26/21 203 lb 12.8 oz (92.4 kg)  12/12/21 218 lb 12.8 oz (99.2 kg)  12/11/21 220 lb (99.8 kg)   Physical Exam Vitals reviewed.  Constitutional:      Appearance: Normal appearance. She is obese.     Comments: In wheelchair  Cardiovascular:     Rate and Rhythm: Normal rate and regular rhythm.     Pulses: Normal pulses.     Heart sounds: Normal heart sounds.  Pulmonary:     Effort: Pulmonary effort is normal.     Breath sounds: Normal breath sounds.  Abdominal:     Palpations: Abdomen is soft. There is no hepatomegaly, splenomegaly or mass.     Tenderness: There is no abdominal tenderness.  Neurological:     General: No focal deficit present.     Mental Status: She is alert and oriented to person, place, and time.  Psychiatric:        Mood and Affect: Mood normal.        Behavior: Behavior normal.     LABORATORY DATA:  I have reviewed the labs as listed.     Latest Ref Rng & Units 12/12/2021    8:01 AM 11/21/2021    7:51 AM 11/14/2021    4:46 AM  CBC  WBC 4.0 - 10.5 K/uL 12.2  7.4  10.2   Hemoglobin 12.0 - 15.0 g/dL 10.9  12.6  11.3   Hematocrit 36.0 - 46.0 % 34.1  38.5  36.1   Platelets 150 - 400 K/uL 665  481  535       Latest Ref Rng & Units 12/12/2021    8:01 AM 11/21/2021    7:51 AM 11/14/2021    4:46 AM  CMP  Glucose 70 - 99 mg/dL 124  111  99   BUN 6 - 20 mg/dL 8  9  12    Creatinine 0.44 - 1.00 mg/dL 0.70  0.68  0.63   Sodium 135 - 145 mmol/L 136  136  135   Potassium 3.5 - 5.1 mmol/L 3.8  3.9  4.5   Chloride 98 - 111 mmol/L 102  103  106   CO2 22 - 32 mmol/L 25  27  24    Calcium 8.9 - 10.3 mg/dL 8.3  7.9  7.5   Total Protein 6.5 - 8.1 g/dL 6.7  5.4  4.9   Total Bilirubin 0.3 - 1.2 mg/dL 1.0  0.7  0.6    Alkaline Phos 38 - 126 U/L 67  45  30   AST 15 - 41 U/L 22  19  19    ALT 0 - 44 U/L 16  11  15      DIAGNOSTIC IMAGING:  I have independently reviewed the scans and discussed with the patient. No results found.   ASSESSMENT:  High-grade serous carcinoma of peritoneum: - She had developed sharp left-sided abdominal pain since Mother's Day, occasional radiating to the back.  40 pound weight loss in the last 6 months. - Personal history of cervical cancer in 2000, status post chemo plus XRT at [facility].  She has slightly decreased hearing in the left ear since then. - US abdomen (11/08/2021): Cholelithiasis with gallbladder wall thickening and sonographic Murphy sign.  Upper abdominal ascites. - Laparoscopy (11/13/2021): Starting of the omentum as well as the upper abdominal wall and diaphragm.  Liver was within normal limits.  6 L of ascites was evacuated.  Omental and peritoneal biopsies were sent.  Peritoneal implants throughout and a significant portion of omentum and on the small intestine. - CT CAP (11/13/2021): Ascites, extensive peritoneal thickening, nodularity and omental caking.  No evidence of primary malignancy in the chest, abdomen or pelvis.  Small left pleural effusion, nonspecific.  Cholelithiasis. - US pelvis (11/14/2021): Uterus is grossly unremarkable.  Ovaries not visualized. - CA125 (11/13/2021): 1269 - Pathology: Peritoneum and omental biopsy consistent with high-grade serous carcinoma.  HER2 2+ by IHC and negative by FISH. - 3 cycles of carboplatin and paclitaxel from 11/21/2021 through 01/02/2022.    Social/family history: - Lives at home with her husband.  Worked in Scientist, research (medical) at [employer].  Non-smoker. - Maternal grandmother with bladder cancer.  Maternal great grandmother with breast cancer.   PLAN:  High-grade serous carcinoma of peritoneum: - After cycle 2 3 weeks ago, she experienced some diarrhea which was better tolerated than after cycle 1. - Reviewed labs today  which showed normal LFTs.  Albumin is trending up.  CBC was grossly normal with anemia from myelosuppression. - Proceed with cycle 3 today.  We will arrange for CT CAP and tumor marker. - She will follow-up with Dr. Berline Lopes after this cycle. - RTC 3 weeks for follow-up.  2.  Peripheral neuropathy: - She has constant tingling and numbness in the fingertips and toes which is stable which started after cycle 1. - She had numbness in the face which lasted 2 weeks after cycle 2 with paclitaxel dose reduction. - Continue paclitaxel dose reduction by 20% during cycle 3.  3.  Weight loss: - She lost about 20 pounds after cycle 2.  She has lost 15 pounds after cycle 1. - The weight loss is predominantly fluid loss.  Her albumin is also trending up from 2-2.9. - She also reported decrease in appetite and change in taste.  We will closely monitor.  4.  Hypomagnesemia: - Magnesium today is 1.5.  She will receive IV magnesium. - We will start her on magnesium twice daily.   Orders placed this encounter:  No orders of the defined types were placed in this encounter.    Derek Jack, MD [place] [phone]   I, Thana Ates, am acting as a scribe for Dr. Derek Jack.  I, Derek Jack MD, have reviewed the above documentation for accuracy and completeness, and I agree with the above.

## 2022-01-02 NOTE — Patient Instructions (Signed)
[facility]  Discharge Instructions: Thank you for choosing [facility] to provide your oncology and hematology care.  If you have a lab appointment with the [facility], please come in thru the Main Entrance and check in at the main information desk.  Wear comfortable clothing and clothing appropriate for easy access to any Portacath or PICC line.   We strive to give you quality time with your provider. You may need to reschedule your appointment if you arrive late (15 or more minutes).  Arriving late affects you and other patients whose appointments are after yours.  Also, if you miss three or more appointments without notifying the office, you may be dismissed from the clinic at the provider's discretion.      For prescription refill requests, have your pharmacy contact our office and allow 72 hours for refills to be completed.    Today you received the following chemotherapy and/or immunotherapy agents Taxol/Carbo.  Carboplatin injection What is this medication? CARBOPLATIN (KAR boe pla tin) is a chemotherapy drug. It targets fast dividing cells, like cancer cells, and causes these cells to die. This medicine is used to treat ovarian cancer and many other cancers. This medicine may be used for other purposes; ask your health care provider or pharmacist if you have questions. COMMON BRAND NAME(S): Paraplatin What should I tell my care team before I take this medication? They need to know if you have any of these conditions: blood disorders hearing problems kidney disease recent or ongoing radiation therapy an unusual or allergic reaction to carboplatin, cisplatin, other chemotherapy, other medicines, foods, dyes, or preservatives pregnant or trying to get pregnant breast-feeding How should I use this medication? This drug is usually given as an infusion into a vein. It is administered in a hospital or clinic by a specially trained health care  professional. Talk to your pediatrician regarding the use of this medicine in children. Special care may be needed. Overdosage: If you think you have taken too much of this medicine contact a poison control center or emergency room at once. NOTE: This medicine is only for you. Do not share this medicine with others. What if I miss a dose? It is important not to miss a dose. Call your doctor or health care professional if you are unable to keep an appointment. What may interact with this medication? medicines for seizures medicines to increase blood counts like filgrastim, pegfilgrastim, sargramostim some antibiotics like amikacin, gentamicin, neomycin, streptomycin, tobramycin vaccines Talk to your doctor or health care professional before taking any of these medicines: acetaminophen aspirin ibuprofen ketoprofen naproxen This list may not describe all possible interactions. Give your health care provider a list of all the medicines, herbs, non-prescription drugs, or dietary supplements you use. Also tell them if you smoke, drink alcohol, or use illegal drugs. Some items may interact with your medicine. What should I watch for while using this medication? Your condition will be monitored carefully while you are receiving this medicine. You will need important blood work done while you are taking this medicine. This drug may make you feel generally unwell. This is not uncommon, as chemotherapy can affect healthy cells as well as cancer cells. Report any side effects. Continue your course of treatment even though you feel ill unless your doctor tells you to stop. In some cases, you may be given additional medicines to help with side effects. Follow all directions for their use. Call your doctor or health care professional  for advice if you get a fever, chills or sore throat, or other symptoms of a cold or flu. Do not treat yourself. This drug decreases your body's ability to fight infections. Try  to avoid being around people who are sick. This medicine may increase your risk to bruise or bleed. Call your doctor or health care professional if you notice any unusual bleeding. Be careful brushing and flossing your teeth or using a toothpick because you may get an infection or bleed more easily. If you have any dental work done, tell your dentist you are receiving this medicine. Avoid taking products that contain aspirin, acetaminophen, ibuprofen, naproxen, or ketoprofen unless instructed by your doctor. These medicines may hide a fever. Do not become pregnant while taking this medicine. Women should inform their doctor if they wish to become pregnant or think they might be pregnant. There is a potential for serious side effects to an unborn child. Talk to your health care professional or pharmacist for more information. Do not breast-feed an infant while taking this medicine. What side effects may I notice from receiving this medication? Side effects that you should report to your doctor or health care professional as soon as possible: allergic reactions like skin rash, itching or hives, swelling of the face, lips, or tongue signs of infection - fever or chills, cough, sore throat, pain or difficulty passing urine signs of decreased platelets or bleeding - bruising, pinpoint red spots on the skin, black, tarry stools, nosebleeds signs of decreased red blood cells - unusually weak or tired, fainting spells, lightheadedness breathing problems changes in hearing changes in vision chest pain high blood pressure low blood counts - This drug may decrease the number of white blood cells, red blood cells and platelets. You may be at increased risk for infections and bleeding. nausea and vomiting pain, swelling, redness or irritation at the injection site pain, tingling, numbness in the hands or feet problems with balance, talking, walking trouble passing urine or change in the amount of urine Side  effects that usually do not require medical attention (report to your doctor or health care professional if they continue or are bothersome): hair loss loss of appetite metallic taste in the mouth or changes in taste This list may not describe all possible side effects. Call your doctor for medical advice about side effects. You may report side effects to FDA at 1-800-FDA-1088. Where should I keep my medication? This drug is given in a hospital or clinic and will not be stored at home. NOTE: This sheet is a summary. It may not cover all possible information. If you have questions about this medicine, talk to your doctor, pharmacist, or health care provider.  2023 Elsevier/Gold Standard (2007-10-29 00:00:00)   Paclitaxel injection What is this medication? PACLITAXEL (PAK li TAX el) is a chemotherapy drug. It targets fast dividing cells, like cancer cells, and causes these cells to die. This medicine is used to treat ovarian cancer, breast cancer, lung cancer, Kaposi's sarcoma, and other cancers. This medicine may be used for other purposes; ask your health care provider or pharmacist if you have questions. COMMON BRAND NAME(S): Onxol, Taxol What should I tell my care team before I take this medication? They need to know if you have any of these conditions: history of irregular heartbeat liver disease low blood counts, like low white cell, platelet, or red cell counts lung or breathing disease, like asthma tingling of the fingers or toes, or other nerve disorder an unusual or  allergic reaction to paclitaxel, alcohol, polyoxyethylated castor oil, other chemotherapy, other medicines, foods, dyes, or preservatives pregnant or trying to get pregnant breast-feeding How should I use this medication? This drug is given as an infusion into a vein. It is administered in a hospital or clinic by a specially trained health care professional. Talk to your pediatrician regarding the use of this medicine  in children. Special care may be needed. Overdosage: If you think you have taken too much of this medicine contact a poison control center or emergency room at once. NOTE: This medicine is only for you. Do not share this medicine with others. What if I miss a dose? It is important not to miss your dose. Call your doctor or health care professional if you are unable to keep an appointment. What may interact with this medication? Do not take this medicine with any of the following medications: live virus vaccines This medicine may also interact with the following medications: antiviral medicines for hepatitis, HIV or AIDS certain antibiotics like erythromycin and clarithromycin certain medicines for fungal infections like ketoconazole and itraconazole certain medicines for seizures like carbamazepine, phenobarbital, phenytoin gemfibrozil nefazodone rifampin St. John's wort This list may not describe all possible interactions. Give your health care provider a list of all the medicines, herbs, non-prescription drugs, or dietary supplements you use. Also tell them if you smoke, drink alcohol, or use illegal drugs. Some items may interact with your medicine. What should I watch for while using this medication? Your condition will be monitored carefully while you are receiving this medicine. You will need important blood work done while you are taking this medicine. This medicine can cause serious allergic reactions. To reduce your risk you will need to take other medicine(s) before treatment with this medicine. If you experience allergic reactions like skin rash, itching or hives, swelling of the face, lips, or tongue, tell your doctor or health care professional right away. In some cases, you may be given additional medicines to help with side effects. Follow all directions for their use. This drug may make you feel generally unwell. This is not uncommon, as chemotherapy can affect healthy cells as  well as cancer cells. Report any side effects. Continue your course of treatment even though you feel ill unless your doctor tells you to stop. Call your doctor or health care professional for advice if you get a fever, chills or sore throat, or other symptoms of a cold or flu. Do not treat yourself. This drug decreases your body's ability to fight infections. Try to avoid being around people who are sick. This medicine may increase your risk to bruise or bleed. Call your doctor or health care professional if you notice any unusual bleeding. Be careful brushing and flossing your teeth or using a toothpick because you may get an infection or bleed more easily. If you have any dental work done, tell your dentist you are receiving this medicine. Avoid taking products that contain aspirin, acetaminophen, ibuprofen, naproxen, or ketoprofen unless instructed by your doctor. These medicines may hide a fever. Do not become pregnant while taking this medicine. Women should inform their doctor if they wish to become pregnant or think they might be pregnant. There is a potential for serious side effects to an unborn child. Talk to your health care professional or pharmacist for more information. Do not breast-feed an infant while taking this medicine. Men are advised not to father a child while receiving this medicine. This product may contain alcohol.  Ask your pharmacist or healthcare provider if this medicine contains alcohol. Be sure to tell all healthcare providers you are taking this medicine. Certain medicines, like metronidazole and disulfiram, can cause an unpleasant reaction when taken with alcohol. The reaction includes flushing, headache, nausea, vomiting, sweating, and increased thirst. The reaction can last from 30 minutes to several hours. What side effects may I notice from receiving this medication? Side effects that you should report to your doctor or health care professional as soon as  possible: allergic reactions like skin rash, itching or hives, swelling of the face, lips, or tongue breathing problems changes in vision fast, irregular heartbeat high or low blood pressure mouth sores pain, tingling, numbness in the hands or feet signs of decreased platelets or bleeding - bruising, pinpoint red spots on the skin, black, tarry stools, blood in the urine signs of decreased red blood cells - unusually weak or tired, feeling faint or lightheaded, falls signs of infection - fever or chills, cough, sore throat, pain or difficulty passing urine signs and symptoms of liver injury like dark yellow or Abdulkadir Emmanuel urine; general ill feeling or flu-like symptoms; light-colored stools; loss of appetite; nausea; right upper belly pain; unusually weak or tired; yellowing of the eyes or skin swelling of the ankles, feet, hands unusually slow heartbeat Side effects that usually do not require medical attention (report to your doctor or health care professional if they continue or are bothersome): diarrhea hair loss loss of appetite muscle or joint pain nausea, vomiting pain, redness, or irritation at site where injected tiredness This list may not describe all possible side effects. Call your doctor for medical advice about side effects. You may report side effects to FDA at 1-800-FDA-1088. Where should I keep my medication? This drug is given in a hospital or clinic and will not be stored at home. NOTE: This sheet is a summary. It may not cover all possible information. If you have questions about this medicine, talk to your doctor, pharmacist, or health care provider.  2023 Elsevier/Gold Standard (2021-04-21 00:00:00)        To help prevent nausea and vomiting after your treatment, we encourage you to take your nausea medication as directed.  BELOW ARE SYMPTOMS THAT SHOULD BE REPORTED IMMEDIATELY: *FEVER GREATER THAN 100.4 F (38 C) OR HIGHER *CHILLS OR SWEATING *NAUSEA AND VOMITING  THAT IS NOT CONTROLLED WITH YOUR NAUSEA MEDICATION *UNUSUAL SHORTNESS OF BREATH *UNUSUAL BRUISING OR BLEEDING *URINARY PROBLEMS (pain or burning when urinating, or frequent urination) *BOWEL PROBLEMS (unusual diarrhea, constipation, pain near the anus) TENDERNESS IN MOUTH AND THROAT WITH OR WITHOUT PRESENCE OF ULCERS (sore throat, sores in mouth, or a toothache) UNUSUAL RASH, SWELLING OR PAIN  UNUSUAL VAGINAL DISCHARGE OR ITCHING   Items with * indicate a potential emergency and should be followed up as soon as possible or go to the Emergency Department if any problems should occur.  Please show the CHEMOTHERAPY ALERT CARD or IMMUNOTHERAPY ALERT CARD at check-in to the Emergency Department and triage nurse.  Should you have questions after your visit or need to cancel or reschedule your appointment, please contact [facility] [phone]  and follow the prompts.  Office hours are 8:00 a.m. to 4:30 p.m. Monday - Friday. Please note that voicemails left after 4:00 p.m. may not be returned until the following business day.  We are closed weekends and major holidays. You have access to a nurse at all times for urgent questions. Please call the main number  to the clinic [phone] and follow the prompts.  For any non-urgent questions, you may also contact your provider using MyChart. We now offer e-Visits for anyone 38 and older to request care online for non-urgent symptoms. For details visit [email or web address].   Also download the MyChart app! Go to the app store, search "MyChart", open the app, select [facility], and log in with your MyChart username and password.  Masks are optional in the cancer centers. If you would like for your care team to wear a mask while they are taking care of you, please let them know. For doctor visits, patients may have with them one support person who is at least 53 years old. At this time, visitors are not allowed in the infusion  area.

## 2022-01-03 LAB — CA 125: Cancer Antigen (CA) 125: 322 U/mL — ABNORMAL HIGH (ref 0.0–38.1)

## 2022-01-04 ENCOUNTER — Inpatient Hospital Stay: Payer: Commercial Managed Care - PPO

## 2022-01-04 VITALS — BP 106/72 | HR 80 | Temp 97.6°F | Resp 16

## 2022-01-04 DIAGNOSIS — Z5111 Encounter for antineoplastic chemotherapy: Secondary | ICD-10-CM | POA: Diagnosis not present

## 2022-01-04 DIAGNOSIS — C482 Malignant neoplasm of peritoneum, unspecified: Secondary | ICD-10-CM

## 2022-01-04 MED ORDER — PEGFILGRASTIM INJECTION 6 MG/0.6ML ~~LOC~~
6.0000 mg | PREFILLED_SYRINGE | Freq: Once | SUBCUTANEOUS | Status: AC
  Administered 2022-01-04: 6 mg via SUBCUTANEOUS
  Filled 2022-01-04: qty 0.6

## 2022-01-04 NOTE — Patient Instructions (Signed)
[facility]  Discharge Instructions: Thank you for choosing [facility] to provide your oncology and hematology care.  If you have a lab appointment with the [facility], please come in thru the Main Entrance and check in at the main information desk.  Wear comfortable clothing and clothing appropriate for easy access to any Portacath or PICC line.   We strive to give you quality time with your provider. You may need to reschedule your appointment if you arrive late (15 or more minutes).  Arriving late affects you and other patients whose appointments are after yours.  Also, if you miss three or more appointments without notifying the office, you may be dismissed from the clinic at the provider's discretion.      For prescription refill requests, have your pharmacy contact our office and allow 72 hours for refills to be completed.    Today you received the following chemotherapy and/or immunotherapy agents neulasta injection      To help prevent nausea and vomiting after your treatment, we encourage you to take your nausea medication as directed.  BELOW ARE SYMPTOMS THAT SHOULD BE REPORTED IMMEDIATELY: *FEVER GREATER THAN 100.4 F (38 C) OR HIGHER *CHILLS OR SWEATING *NAUSEA AND VOMITING THAT IS NOT CONTROLLED WITH YOUR NAUSEA MEDICATION *UNUSUAL SHORTNESS OF BREATH *UNUSUAL BRUISING OR BLEEDING *URINARY PROBLEMS (pain or burning when urinating, or frequent urination) *BOWEL PROBLEMS (unusual diarrhea, constipation, pain near the anus) TENDERNESS IN MOUTH AND THROAT WITH OR WITHOUT PRESENCE OF ULCERS (sore throat, sores in mouth, or a toothache) UNUSUAL RASH, SWELLING OR PAIN  UNUSUAL VAGINAL DISCHARGE OR ITCHING   Items with * indicate a potential emergency and should be followed up as soon as possible or go to the Emergency Department if any problems should occur.  Please show the CHEMOTHERAPY ALERT CARD or IMMUNOTHERAPY ALERT CARD at check-in to  the Emergency Department and triage nurse.  Should you have questions after your visit or need to cancel or reschedule your appointment, please contact [facility] [phone]  and follow the prompts.  Office hours are 8:00 a.m. to 4:30 p.m. Monday - Friday. Please note that voicemails left after 4:00 p.m. may not be returned until the following business day.  We are closed weekends and major holidays. You have access to a nurse at all times for urgent questions. Please call the main number to the clinic [phone] and follow the prompts.  For any non-urgent questions, you may also contact your provider using MyChart. We now offer e-Visits for anyone 77 and older to request care online for non-urgent symptoms. For details visit [email or web address].   Also download the MyChart app! Go to the app store, search "MyChart", open the app, select [facility], and log in with your MyChart username and password.  Masks are optional in the cancer centers. If you would like for your care team to wear a mask while they are taking care of you, please let them know. For doctor visits, patients may have with them one support person who is at least 53 years old. At this time, visitors are not allowed in the infusion area.

## 2022-01-04 NOTE — Progress Notes (Signed)
Neulasta injection given per orders. Patient tolerated it well without problems. Vitals stable and discharged home from clinic ambulatory. Follow up as scheduled.

## 2022-01-18 ENCOUNTER — Other Ambulatory Visit (HOSPITAL_COMMUNITY): Payer: Self-pay | Admitting: Hematology

## 2022-01-18 ENCOUNTER — Ambulatory Visit (HOSPITAL_COMMUNITY)
Admission: RE | Admit: 2022-01-18 | Discharge: 2022-01-18 | Disposition: A | Discharge status: Home / Self Care | Payer: Commercial Managed Care - PPO | Source: Ambulatory Visit | Attending: Hematology | Admitting: Hematology

## 2022-01-18 ENCOUNTER — Encounter: Payer: Self-pay | Admitting: Gynecologic Oncology

## 2022-01-18 DIAGNOSIS — C482 Malignant neoplasm of peritoneum, unspecified: Secondary | ICD-10-CM | POA: Insufficient documentation

## 2022-01-18 MED ORDER — IOHEXOL 300 MG/ML  SOLN
100.0000 mL | Freq: Once | INTRAMUSCULAR | Status: AC | PRN
Start: 2022-01-18 — End: 2022-01-18
  Administered 2022-01-18: 100 mL via INTRAVENOUS

## 2022-01-22 ENCOUNTER — Encounter: Payer: Self-pay | Admitting: Gynecologic Oncology

## 2022-01-22 ENCOUNTER — Other Ambulatory Visit: Payer: Self-pay

## 2022-01-22 ENCOUNTER — Inpatient Hospital Stay: Payer: Commercial Managed Care - PPO

## 2022-01-22 ENCOUNTER — Inpatient Hospital Stay (HOSPITAL_BASED_OUTPATIENT_CLINIC_OR_DEPARTMENT_OTHER): Payer: Commercial Managed Care - PPO | Admitting: Gynecologic Oncology

## 2022-01-22 VITALS — BP 131/81 | HR 127 | Temp 98.4°F | Resp 18 | Wt 189.1 lb

## 2022-01-22 DIAGNOSIS — C579 Malignant neoplasm of female genital organ, unspecified: Secondary | ICD-10-CM

## 2022-01-22 DIAGNOSIS — R978 Other abnormal tumor markers: Secondary | ICD-10-CM | POA: Diagnosis not present

## 2022-01-22 DIAGNOSIS — Z8541 Personal history of malignant neoplasm of cervix uteri: Secondary | ICD-10-CM

## 2022-01-22 DIAGNOSIS — C482 Malignant neoplasm of peritoneum, unspecified: Secondary | ICD-10-CM | POA: Diagnosis not present

## 2022-01-22 DIAGNOSIS — Z923 Personal history of irradiation: Secondary | ICD-10-CM | POA: Diagnosis not present

## 2022-01-22 DIAGNOSIS — C800 Disseminated malignant neoplasm, unspecified: Secondary | ICD-10-CM

## 2022-01-22 DIAGNOSIS — K56699 Other intestinal obstruction unspecified as to partial versus complete obstruction: Secondary | ICD-10-CM

## 2022-01-22 DIAGNOSIS — R000 Tachycardia, unspecified: Secondary | ICD-10-CM

## 2022-01-22 DIAGNOSIS — Z5111 Encounter for antineoplastic chemotherapy: Secondary | ICD-10-CM | POA: Diagnosis not present

## 2022-01-22 LAB — CBC WITH DIFFERENTIAL/PLATELET
Abs Immature Granulocytes: 0.04 10*3/uL (ref 0.00–0.07)
Basophils Absolute: 0 10*3/uL (ref 0.0–0.1)
Basophils Relative: 1 %
Eosinophils Absolute: 0.1 10*3/uL (ref 0.0–0.5)
Eosinophils Relative: 1 %
HCT: 30.4 % — ABNORMAL LOW (ref 36.0–46.0)
Hemoglobin: 10.1 g/dL — ABNORMAL LOW (ref 12.0–15.0)
Immature Granulocytes: 1 %
Lymphocytes Relative: 22 %
Lymphs Abs: 0.9 10*3/uL (ref 0.7–4.0)
MCH: 28.7 pg (ref 26.0–34.0)
MCHC: 33.2 g/dL (ref 30.0–36.0)
MCV: 86.4 fL (ref 80.0–100.0)
Monocytes Absolute: 0.4 10*3/uL (ref 0.1–1.0)
Monocytes Relative: 9 %
Neutro Abs: 2.7 10*3/uL (ref 1.7–7.7)
Neutrophils Relative %: 66 %
Platelets: 267 10*3/uL (ref 150–400)
RBC: 3.52 MIL/uL — ABNORMAL LOW (ref 3.87–5.11)
RDW: 21.3 % — ABNORMAL HIGH (ref 11.5–15.5)
WBC: 4.1 10*3/uL (ref 4.0–10.5)
nRBC: 0 % (ref 0.0–0.2)

## 2022-01-22 LAB — COMPREHENSIVE METABOLIC PANEL
ALT: 17 U/L (ref 0–44)
AST: 26 U/L (ref 15–41)
Albumin: 3.7 g/dL (ref 3.5–5.0)
Alkaline Phosphatase: 61 U/L (ref 38–126)
Anion gap: 10 (ref 5–15)
BUN: 7 mg/dL (ref 6–20)
CO2: 27 mmol/L (ref 22–32)
Calcium: 9.4 mg/dL (ref 8.9–10.3)
Chloride: 100 mmol/L (ref 98–111)
Creatinine, Ser: 0.68 mg/dL (ref 0.44–1.00)
GFR, Estimated: >60 mL/min (ref >60)
Glucose, Bld: 119 mg/dL — ABNORMAL HIGH (ref 70–99)
Potassium: 3.2 mmol/L — ABNORMAL LOW (ref 3.5–5.1)
Sodium: 137 mmol/L (ref 135–145)
Total Bilirubin: 0.9 mg/dL (ref 0.3–1.2)
Total Protein: 7 g/dL (ref 6.5–8.1)

## 2022-01-22 LAB — MAGNESIUM: Magnesium: 1.7 mg/dL (ref 1.7–2.4)

## 2022-01-22 NOTE — Patient Instructions (Addendum)
We will tentatively plan for surgery at [facility] with Dr. Jeral Pinch on February 20, 2022. We will see you back in the office closer to the date for a preop appointment with Joylene John NP to discuss the instrcutions for before and after surgery.  You may also receive a phone call from the hospital to arrange for a pre-op appointment there as well. Usually both appointments can be combined on the same day.    We will need to get you in for a colonoscopy prior to surgery. You should be contacted with an appointment.  Proceed as planned for chemotherapy tomorrow under the guidance of Dr. [name] at [facility].

## 2022-01-22 NOTE — Progress Notes (Signed)
Gynecologic Oncology Return Clinic Visit  01/22/2022  Reason for Visit: Treatment planning  Treatment History: Oncology History  Primary peritoneal carcinomatosis ([place])  11/17/2021 Initial Diagnosis   Primary peritoneal carcinomatosis ([place])   11/21/2021 -  Chemotherapy   Patient is on Treatment Plan : OVARIAN Carboplatin (AUC 6) / Paclitaxel (175) q21d x 6 cycles      11/08/21: RUQ ultrasound showed cholelithiasis with gallbladder wall thickening and sonographic Murphy sign. Upper abdominal ascites.  11/09/21: Saw Dr. Arnoldo Morale for RUQ and epigastric pain with findings of cholelithiasis on imaging.  11/13/21: Taken for lsc cholecystectomy but given findings of carcinomatosis, biopsies performed and procedure abandoned. Peritoneal biopsies and omental biopsy showed metastatic high-grade serous carcinoma, favoring GYN primary. Ascites with malignant cells c/w adenocarcinoma.  CA-125: 1,269. 11/13/21: CT C/A/P showed trace ascites, extensive peritoneal thickening, nodularity and omental caking. NO obvious pelvic masses. Small left pleural effusion and associated atelectasis, worrisome for malignant effusion. Cholelithiasis without evidence of cholecystitis. Nonobstructive right nephrolithiasis.  11/14/21: Pelvic ultrasound reveals grossly normal uterus with endometrium measuring 89m. Neither ovary visualized due to body habitus and bowel gas. Small ascites.  11/15/21: Port placed. 6/20: Received cycle #1 carboplatin/taxol.  Cycle #3 on 01/02/22.  Interval History: Doing well.  Reports feels weak on treatment.  Appetite is overall still decreased although has improved over the last week to week and a half.  Reports having good bowel function when she eats.  She has a long history of smaller caliber stool, at baseline.  Denies any significant constipation.  Denies urinary symptoms.  Denies any vaginal bleeding or discharge.  Past Medical/Surgical History: Past Medical History:  Diagnosis Date   Acute  respiratory disease due to COVID-19 virus 02/02/2019   Anxiety    Cancer ([place]    Depression    Diabetes mellitus without complication ([place]    Dyspnea    Dysrhythmia    Hypertension    Ovarian cancer ([place] 11/14/2021   Sleep apnea    doesn't use CPAP   Tachycardia     Past Surgical History:  Procedure Laterality Date   KNEE SURGERY Right    Arthroscopic   LAPAROSCOPIC ABDOMINAL EXPLORATION N/A 11/13/2021   Procedure: Exploratory laparoscopy;  Surgeon: JAviva Signs MD;  Location: [facility];  Service: General;  Laterality: N/A;   PORTACATH PLACEMENT Left 11/15/2021   Procedure: INSERTION PORT-A-CATH;  Surgeon: JAviva Signs MD;  Location: [facility];  Service: General;  Laterality: Left;   TONSILLECTOMY AND ADENOIDECTOMY      Family History  Problem Relation Age of Onset   Hypertension Mother    Bladder Cancer Maternal Grandmother    Breast cancer Maternal Great-grandmother    Colon cancer Neg Hx    Ovarian cancer Neg Hx    Endometrial cancer Neg Hx    Pancreatic cancer Neg Hx    Prostate cancer Neg Hx     Social History   Socioeconomic History   Marital status: Married    Spouse name: Not on file   Number of children: Not on file   Years of education: Not on file   Highest education level: Not on file  Occupational History   Occupation: Unemployed  Tobacco Use   Smoking status: Never   Smokeless tobacco: Never  Vaping Use   Vaping Use: Never used  Substance and Sexual Activity   Alcohol use: No   Drug use: No   Sexual activity: Not Currently  Other Topics Concern   Not on file  Social History Narrative  Not on file   Social Determinants of Health   Financial Resource Strain: Not on file  Food Insecurity: Not on file  Transportation Needs: Not on file  Physical Activity: Not on file  Stress: Not on file  Social Connections: Not on file    Current Medications:  Current Outpatient Medications:    aspirin EC 81 MG tablet, Take 81 mg by mouth daily.,  Disp: , Rfl:    atorvastatin (LIPITOR) 20 MG tablet, Take 1 tablet (20 mg total) by mouth daily., Disp: 90 tablet, Rfl: 1   lidocaine-prilocaine (EMLA) cream, Apply to affected area once, Disp: 30 g, Rfl: 3   magnesium oxide (MAG-OX) 400 (240 Mg) MG tablet, Take 1 tablet (400 mg total) by mouth 2 (two) times daily., Disp: 60 tablet, Rfl: 2  Review of Systems: Denies fevers, chills, unexplained weight changes. Denies hearing loss, neck lumps or masses, mouth sores, ringing in ears or voice changes. Denies cough or wheezing.  Denies shortness of breath. Denies chest pain or palpitations. Denies leg swelling. Denies abdominal distention, pain, blood in stools, constipation, diarrhea, nausea, vomiting, or early satiety. Denies pain with intercourse, dysuria, frequency, hematuria or incontinence. Denies hot flashes, pelvic pain, vaginal bleeding or vaginal discharge.   Denies joint pain, back pain or muscle pain/cramps. Denies itching, rash, or wounds. Denies dizziness, headaches, numbness or seizures. Denies swollen lymph nodes or glands, denies easy bruising or bleeding. Denies anxiety, depression, confusion, or decreased concentration.  Physical Exam: BP 131/81 (BP Location: Right Arm, Patient Position: Sitting)   Pulse (!) 127   Temp 98.4 F (36.9 C) (Oral)   Resp 18   Wt 189 lb 2 oz (85.8 kg)   SpO2 100%   BMI 33.50 kg/m  General: Alert, oriented, no acute distress. HEENT: Normocephalic, atraumatic, sclera anicteric. Chest: Clear to auscultation bilaterally.  No wheezes or rhonchi.  Port site clean. Cardiovascular: Tachycardic, regular rhythm, no murmurs. Abdomen: Obese, soft, nontender.  Normoactive bowel sounds.  No masses or hepatosplenomegaly appreciated.   Extremities: Grossly normal range of motion.  Warm, well perfused.  No edema bilaterally. Skin: No rashes or lesions noted. Lymphatics: No cervical, supraclavicular, or inguinal adenopathy. GU: Normal appearing external  genitalia without erythema, excoriation, or lesions.  Speculum exam reveals significantly atrophic vaginal mucosa with radiation changes.  What I presume to be the cervix is flush with the vaginal apex, unable to appreciate an os.  Bimanual exam reveals small uterus, moderately mobile.  Rectovaginal exam confirms findings, unable to appreciate any sort of stricturing within the rectum.  Laboratory & Radiologic Studies: CT C/A/P: IMPRESSION: 1. Small amount of adherent thrombus or fibrin sheath around the catheter in the LEFT brachiocephalic vein. 2. Small low-attenuation area in the inter lobar fissure region associated with medial segment of LEFT hepatic lobe, new from previous imaging, may represent developing "variable fat deposition" though given history of peritoneal disease would suggest attention on subsequent imaging. 3. Diminished thickening of mesenteric reflections, decreased ascites and decreased omental infiltration since previous imaging. 4. Focal concentric narrowing of the rectosigmoid junction is nonspecific but suspicious for neoplasm potentially related to serosal involvement in the pelvis in this patient with known peritoneal disease from ovarian cancer. Underlying colonic neoplasm would be difficult to exclude as well. Colonic assessment with endoscopy or comparison with recent endoscopy performed is suggested. 5. Cholelithiasis without evidence of acute cholecystitis. 6. 4 mm RIGHT lower pole renal calculus.     Latest Ref Rng & Units 01/22/2022    3:58  PM 01/02/2022    8:37 AM 12/12/2021    8:01 AM  CBC  WBC 4.0 - 10.5 K/uL 4.1  4.7  12.2   Hemoglobin 12.0 - 15.0 g/dL 10.1  9.8  10.9   Hematocrit 36.0 - 46.0 % 30.4  31.1  34.1   Platelets 150 - 400 K/uL 267  382  665       Latest Ref Rng & Units 01/22/2022    3:58 PM 01/02/2022    8:37 AM 12/12/2021    8:01 AM  BMP  Glucose 70 - 99 mg/dL 119  94  124   BUN 6 - 20 mg/dL _0 Creatinine 0.44 - 1.00 mg/dL  0.68  0.57  0.70   Sodium 135 - 145 mmol/L 137  134  136   Potassium 3.5 - 5.1 mmol/L 3.2  3.3  3.8   Chloride 98 - 111 mmol/L 100  98  102   CO2 22 - 32 mmol/L _1 Calcium 8.9 - 10.3 mg/dL 9.4  8.4  8.3    Assessment & Plan: Nicole Santos is a 51 y.o. woman with stage IVA gynecologic malignancy, presumed to be primary peritoneal versus uterine.  Reviewed recent CT scan, which shows improvement in abdominal metastatic disease.  Reviewed persistent omental disease.  CA-125 has also responded to systemic therapy.  HER2 testing on prior biopsy was negative.  Discussed findings of concentric narrowing of the rectosigmoid junction.  When I go back and look at her initial CT scan, I see this although less clearly because she did not have oral contrast.  My suspicion is that this is related to her history of radiation for cervix cancer over 20 years ago.  She notes a very long history of smaller caliber stool, which would support this theory.  She has not had a screening colonoscopy and, in the setting of the stricture, may be helpful to elucidate whether there is any mucosal changes or evidence of radiation changes at this location to help prove that this is not related to her current malignancy.  Given abdominal and pelvic findings, I recommended proceeding with 1 additional cycle of chemotherapy with plan for interval debulking surgery 4 weeks after cycle 4, which she is scheduled to receive tomorrow.  While I am hopeful not to perform any bowel surgery (this would be quite challenging with increased morbidity in the setting of her prior radiation history), we will plan for bowel prep in the event she needs colon resection.  On exam today, I am unable to proceed with endometrial biopsy.  I hope to be able to proceed with total hysterectomy and BSO at the time of her surgery despite her remote history of pelvic radiation.  This puts her at increased risk of adhesive disease and fibrosis as well as  issues with wound healing after surgery.  Patient was offered preoperative education today but prefers to return closer to the time of surgery.  At that time, we will discuss again plan for surgery which includes exploratory laparotomy, omentectomy, tumor debulking, and likely total abdominal hysterectomy and bilateral salpingo-oophorectomy, possible bowel resection.  We will review risks of surgery with her again in detail.  Discussed that she will receive 2-3 cycles of adjuvant chemotherapy after she recovers from surgery.  Patient was somewhat disappointed about this.  I reminded her that regardless of timing of surgery, plan has always been for 6 cycles of chemotherapy.  Patient  was tachycardic in clinic today, which she has been on most of her more recent clinic visits.  EKG was obtained.  The first EKG showed prolonged QTc.  Repeat EKG a minute later showed normal QTc.  Otherwise EKG was similar to prior EKGs.  Labs were obtained as noted above.  Patient is asymptomatic.  Reviewed recommendation for further evaluation in the emergency department or urgent care given risk of torsades with prolonged QTc.  Patient prefers to follow-up tomorrow as she is scheduled for chemotherapy.  Reviewed precautions and when/why to present to the emergency department overnight.  52 minutes of total time was spent for this patient encounter, including preparation, face-to-face counseling with the patient and coordination of care, and documentation of the encounter.  Jeral Pinch, MD  Division of Gynecologic Oncology  Department of Obstetrics and Gynecology  [facility]

## 2022-01-22 NOTE — Progress Notes (Signed)
Patient is currently asymptomatic denying chest pain, dyspnea. She states her heart rate is always high. Discussed EKG results with one read out from 01/22/22 at 16:15 stating critical test results long QTc at 577 ms. Another EKG print out at 16:16 with QTc at 435 ms. Discussed CBC results. Per Dr. Berline Lopes, patient can seek further evaluation in the ER or since asymptomatic, follow up tomorrow at her upcoming appointment with the knowledge that she could have an arrhythmia that could lead to death.  Discussed Cmet and Mag. She is continuing to take magnesium twice daily. She has decided to go home and follow up tomorrow. She denies symptoms at rest and reports mild shortness of breath with ambulation. Advised to call for any needs or concerns. Reportable signs and symptoms reviewed. She has plans to follow up with Dr. [name] at [facility] tomorrow.

## 2022-01-23 ENCOUNTER — Inpatient Hospital Stay: Payer: Commercial Managed Care - PPO

## 2022-01-23 ENCOUNTER — Telehealth: Payer: Self-pay

## 2022-01-23 ENCOUNTER — Inpatient Hospital Stay: Payer: Commercial Managed Care - PPO | Admitting: Hematology

## 2022-01-23 ENCOUNTER — Encounter: Payer: Self-pay | Admitting: Family Medicine

## 2022-01-23 VITALS — BP 111/65 | HR 88 | Temp 97.4°F | Resp 18

## 2022-01-23 DIAGNOSIS — C482 Malignant neoplasm of peritoneum, unspecified: Secondary | ICD-10-CM

## 2022-01-23 DIAGNOSIS — Z5111 Encounter for antineoplastic chemotherapy: Secondary | ICD-10-CM | POA: Diagnosis not present

## 2022-01-23 LAB — CBC WITH DIFFERENTIAL/PLATELET
Abs Immature Granulocytes: 0.03 10*3/uL (ref 0.00–0.07)
Basophils Absolute: 0 10*3/uL (ref 0.0–0.1)
Basophils Relative: 1 %
Eosinophils Absolute: 0.1 10*3/uL (ref 0.0–0.5)
Eosinophils Relative: 3 %
HCT: 29.6 % — ABNORMAL LOW (ref 36.0–46.0)
Hemoglobin: 9.3 g/dL — ABNORMAL LOW (ref 12.0–15.0)
Immature Granulocytes: 1 %
Lymphocytes Relative: 29 %
Lymphs Abs: 0.9 10*3/uL (ref 0.7–4.0)
MCH: 28.3 pg (ref 26.0–34.0)
MCHC: 31.4 g/dL (ref 30.0–36.0)
MCV: 90 fL (ref 80.0–100.0)
Monocytes Absolute: 0.5 10*3/uL (ref 0.1–1.0)
Monocytes Relative: 14 %
Neutro Abs: 1.7 10*3/uL (ref 1.7–7.7)
Neutrophils Relative %: 52 %
Platelets: 262 10*3/uL (ref 150–400)
RBC: 3.29 MIL/uL — ABNORMAL LOW (ref 3.87–5.11)
RDW: 22.4 % — ABNORMAL HIGH (ref 11.5–15.5)
WBC: 3.3 10*3/uL — ABNORMAL LOW (ref 4.0–10.5)
nRBC: 0 % (ref 0.0–0.2)

## 2022-01-23 LAB — COMPREHENSIVE METABOLIC PANEL
ALT: 18 U/L (ref 0–44)
AST: 20 U/L (ref 15–41)
Albumin: 3.2 g/dL — ABNORMAL LOW (ref 3.5–5.0)
Alkaline Phosphatase: 55 U/L (ref 38–126)
Anion gap: 9 (ref 5–15)
BUN: 10 mg/dL (ref 6–20)
CO2: 25 mmol/L (ref 22–32)
Calcium: 8.5 mg/dL — ABNORMAL LOW (ref 8.9–10.3)
Chloride: 102 mmol/L (ref 98–111)
Creatinine, Ser: 0.58 mg/dL (ref 0.44–1.00)
GFR, Estimated: >60 mL/min (ref >60)
Glucose, Bld: 89 mg/dL (ref 70–99)
Potassium: 3.2 mmol/L — ABNORMAL LOW (ref 3.5–5.1)
Sodium: 136 mmol/L (ref 135–145)
Total Bilirubin: 1.3 mg/dL — ABNORMAL HIGH (ref 0.3–1.2)
Total Protein: 6.8 g/dL (ref 6.5–8.1)

## 2022-01-23 LAB — MAGNESIUM: Magnesium: 1.9 mg/dL (ref 1.7–2.4)

## 2022-01-23 LAB — CA 125: Cancer Antigen (CA) 125: 44.3 U/mL — ABNORMAL HIGH (ref 0.0–38.1)

## 2022-01-23 MED ORDER — SODIUM CHLORIDE 0.9 % IV SOLN: Freq: Once | INTRAVENOUS | Status: AC

## 2022-01-23 MED ORDER — HEPARIN SOD (PORK) LOCK FLUSH 100 UNIT/ML IV SOLN
500.0000 [IU] | Freq: Once | INTRAVENOUS | Status: AC | PRN
  Administered 2022-01-23: 500 [IU]

## 2022-01-23 MED ORDER — POTASSIUM CHLORIDE CRYS ER 20 MEQ PO TBCR: 20.0000 meq | EXTENDED_RELEASE_TABLET | Freq: Every day | ORAL | 3 refills | Status: DC

## 2022-01-23 MED ORDER — SODIUM CHLORIDE 0.9 % IV SOLN
150.0000 mg | Freq: Once | INTRAVENOUS | Status: AC
  Administered 2022-01-23: 150 mg via INTRAVENOUS
  Filled 2022-01-23: qty 150

## 2022-01-23 MED ORDER — DIPHENHYDRAMINE HCL 50 MG/ML IJ SOLN
50.0000 mg | Freq: Once | INTRAMUSCULAR | Status: AC
  Administered 2022-01-23: 50 mg via INTRAVENOUS
  Filled 2022-01-23: qty 1

## 2022-01-23 MED ORDER — SODIUM CHLORIDE 0.9 % IV SOLN
718.0000 mg | Freq: Once | INTRAVENOUS | Status: AC
  Administered 2022-01-23: 720 mg via INTRAVENOUS
  Filled 2022-01-23: qty 72

## 2022-01-23 MED ORDER — FAMOTIDINE IN NACL 20-0.9 MG/50ML-% IV SOLN
20.0000 mg | Freq: Once | INTRAVENOUS | Status: AC
  Administered 2022-01-23: 20 mg via INTRAVENOUS
  Filled 2022-01-23: qty 50

## 2022-01-23 MED ORDER — SODIUM CHLORIDE 0.9 % IV SOLN
10.0000 mg | Freq: Once | INTRAVENOUS | Status: AC
  Administered 2022-01-23: 10 mg via INTRAVENOUS
  Filled 2022-01-23: qty 10

## 2022-01-23 MED ORDER — SODIUM CHLORIDE 0.9 % IV SOLN
140.0000 mg/m2 | Freq: Once | INTRAVENOUS | Status: AC
  Administered 2022-01-23: 282 mg via INTRAVENOUS
  Filled 2022-01-23: qty 47

## 2022-01-23 MED ORDER — SODIUM CHLORIDE 0.9% FLUSH
10.0000 mL | INTRAVENOUS | Status: DC | PRN
  Administered 2022-01-23: 10 mL

## 2022-01-23 MED ORDER — PALONOSETRON HCL INJECTION 0.25 MG/5ML
0.2500 mg | Freq: Once | INTRAVENOUS | Status: AC
  Administered 2022-01-23: 0.25 mg via INTRAVENOUS
  Filled 2022-01-23: qty 5

## 2022-01-23 NOTE — Patient Instructions (Signed)
SURGICAL WAITING ROOM VISITATION Patients having surgery or a procedure may have no more than 2 support people in the waiting area - these visitors may rotate.   Children under the age of 34 must have an adult with them who is not the patient. If the patient needs to stay at the hospital during part of their recovery, the visitor guidelines for inpatient rooms apply. Pre-op nurse will coordinate an appropriate time for 1 support person to accompany patient in pre-op.  This support person may not rotate.    Please refer to the [facility] website for the visitor guidelines for Inpatients (after your surgery is over and you are in a regular room).       Your procedure is scheduled on:  01/30/22    Report to [facility] Main Entrance    Report to admitting at  [place] AM   Call this number if you have problems the morning of surgery [phone]   Do not eat food :After Midnight.   After Midnight you may have the following liquids until ____ 0430__ AM/ PM DAY OF SURGERY  Water Non-Citrus Juices (without pulp, NO RED) Carbonated Beverages Black Coffee (NO MILK/CREAM OR CREAMERS, sugar ok)  Clear Tea (NO MILK/CREAM OR CREAMERS, sugar ok) regular and decaf                             Plain Jell-O (NO RED)                                           Fruit ices (not with fruit pulp, NO RED)                                     Popsicles (NO RED)                                                               Sports drinks like Gatorade (NO RED)              Light diet the day before surgery .             Oral Hygiene is also important to reduce your risk of infection.                                    Remember - BRUSH YOUR TEETH THE MORNING OF SURGERY WITH YOUR REGULAR TOOTHPASTE   Do NOT smoke after Midnight   Take these medicines the morning of surgery with A SIP OF WATER:  none   DO NOT TAKE ANY ORAL DIABETIC MEDICATIONS DAY OF YOUR SURGERY  Bring CPAP mask and tubing day  of surgery.                              You may not have any metal on your body including hair pins, jewelry, and body piercing  Do not wear make-up, lotions, powders, perfumes/cologne, or deodorant  Do not wear nail polish including gel and S&S, artificial/acrylic nails, or any other type of covering on natural nails including finger and toenails. If you have artificial nails, gel coating, etc. that needs to be removed by a nail salon please have this removed prior to surgery or surgery may need to be canceled/ delayed if the surgeon/ anesthesia feels like they are unable to be safely monitored.   Do not shave  48 hours prior to surgery.               Men may shave face and neck.   Do not bring valuables to the hospital. [place].   Contacts, dentures or bridgework may not be worn into surgery.   Bring small overnight bag day of surgery.   DO NOT [place]. PHARMACY WILL DISPENSE MEDICATIONS LISTED ON YOUR MEDICATION LIST TO YOU DURING YOUR ADMISSION [place]!    Patients discharged on the day of surgery will not be allowed to drive home.  Someone NEEDS to stay with you for the first 24 hours after anesthesia.   Special Instructions: Bring a copy of your healthcare power of attorney and living will documents         the day of surgery if you haven't scanned them before.              Please read over the following fact sheets you were given: IF YOU HAVE QUESTIONS ABOUT YOUR PRE-OP INSTRUCTIONS PLEASE CALL [phone]     [facility] - Preparing for Surgery Before surgery, you can play an important role.  Because skin is not sterile, your skin needs to be as free of germs as possible.  You can reduce the number of germs on your skin by washing with CHG (chlorahexidine gluconate) soap before surgery.  CHG is an antiseptic cleaner which kills germs and bonds with the skin to continue  killing germs even after washing. Please DO NOT use if you have an allergy to CHG or antibacterial soaps.  If your skin becomes reddened/irritated stop using the CHG and inform your nurse when you arrive at Short Stay. Do not shave (including legs and underarms) for at least 48 hours prior to the first CHG shower.  You may shave your face/neck. Please follow these instructions carefully:  1.  Shower with CHG Soap the night before surgery and the  morning of Surgery.  2.  If you choose to wash your hair, wash your hair first as usual with your  normal  shampoo.  3.  After you shampoo, rinse your hair and body thoroughly to remove the  shampoo.                           4.  Use CHG as you would any other liquid soap.  You can apply chg directly  to the skin and wash                       Gently with a scrungie or clean washcloth.  5.  Apply the CHG Soap to your body ONLY FROM THE NECK DOWN.   Do not use on face/ open  Wound or open sores. Avoid contact with eyes, ears mouth and genitals (private parts).                       Wash face,  Genitals (private parts) with your normal soap.             6.  Wash thoroughly, paying special attention to the area where your surgery  will be performed.  7.  Thoroughly rinse your body with warm water from the neck down.  8.  DO NOT shower/wash with your normal soap after using and rinsing off  the CHG Soap.                9.  Pat yourself dry with a clean towel.            10.  Wear clean pajamas.            11.  Place clean sheets on your bed the night of your first shower and do not  sleep with pets. Day of Surgery : Do not apply any lotions/deodorants the morning of surgery.  Please wear clean clothes to the hospital/surgery center.  FAILURE TO FOLLOW THESE INSTRUCTIONS MAY RESULT IN THE CANCELLATION OF YOUR SURGERY PATIENT SIGNATURE_________________________________  NURSE  SIGNATURE__________________________________  ________________________________________________________________________

## 2022-01-23 NOTE — Progress Notes (Signed)
Patients port flushed without difficulty.  Good blood return noted with no bruising or swelling noted at site.  Patient remains accessed for chemotherapy treatment.  

## 2022-01-23 NOTE — Telephone Encounter (Signed)
I called Nicole Santos and spoke to [facility] who states the referral for colonoscopy was received yesterday. They have not reached out to pt yet but will today. I notified [name], pt is having surgery with Dr.Tucker on 02/20/22 and Colonoscopy needs to be done before this date per Joylene John NP. [name] voiced an understanding and stated a note would be made.

## 2022-01-23 NOTE — Progress Notes (Signed)
Message received from A. Ouida Sills RN / Dr.Katragadda to proceed with treatment. Labs within parameters for treatment.

## 2022-01-23 NOTE — Progress Notes (Signed)
Anesthesia Review:  PCP: Hendricks Limes, Np Lov 12/26/21  Cardiologist : Chest x-ray : 11/15/21- 1 view  EKG : 01/22/22  CT Chest- 01/19/22  Echo : 2018  Stress test: Cardiac Cath :  Activity level:  Sleep Study/ CPAP : Fasting Blood Sugar :      / Checks Blood Sugar -- times a day:   Blood Thinner/ Instructions /Last Dose: ASA / Instructions/ Last Dose :   81 mg Aspirin  CBC and CMp- 01/23/22 at time of Infusion

## 2022-01-23 NOTE — Patient Instructions (Signed)
[facility]  Discharge Instructions: Thank you for choosing [place] to provide your oncology and hematology care.  If you have a lab appointment with the [facility], please come in thru the Main Entrance and check in at the main information desk.  Wear comfortable clothing and clothing appropriate for easy access to any Portacath or PICC line.   We strive to give you quality time with your provider. You may need to reschedule your appointment if you arrive late (15 or more minutes).  Arriving late affects you and other patients whose appointments are after yours.  Also, if you miss three or more appointments without notifying the office, you may be dismissed from the clinic at the provider's discretion.      For prescription refill requests, have your pharmacy contact our office and allow 72 hours for refills to be completed.    Today you received the following chemotherapy and/or immunotherapy agents taxol, carbo      To help prevent nausea and vomiting after your treatment, we encourage you to take your nausea medication as directed.  BELOW ARE SYMPTOMS THAT SHOULD BE REPORTED IMMEDIATELY: *FEVER GREATER THAN 100.4 F (38 C) OR HIGHER *CHILLS OR SWEATING *NAUSEA AND VOMITING THAT IS NOT CONTROLLED WITH YOUR NAUSEA MEDICATION *UNUSUAL SHORTNESS OF BREATH *UNUSUAL BRUISING OR BLEEDING *URINARY PROBLEMS (pain or burning when urinating, or frequent urination) *BOWEL PROBLEMS (unusual diarrhea, constipation, pain near the anus) TENDERNESS IN MOUTH AND THROAT WITH OR WITHOUT PRESENCE OF ULCERS (sore throat, sores in mouth, or a toothache) UNUSUAL RASH, SWELLING OR PAIN  UNUSUAL VAGINAL DISCHARGE OR ITCHING   Items with * indicate a potential emergency and should be followed up as soon as possible or go to the Emergency Department if any problems should occur.  Please show the CHEMOTHERAPY ALERT CARD or IMMUNOTHERAPY ALERT CARD at check-in to the  Emergency Department and triage nurse.  Should you have questions after your visit or need to cancel or reschedule your appointment, please contact [place] [phone]  and follow the prompts.  Office hours are 8:00 a.m. to 4:30 p.m. Monday - Friday. Please note that voicemails left after 4:00 p.m. may not be returned until the following business day.  We are closed weekends and major holidays. You have access to a nurse at all times for urgent questions. Please call the main number to the clinic [phone] and follow the prompts.  For any non-urgent questions, you may also contact your provider using MyChart. We now offer e-Visits for anyone 48 and older to request care online for non-urgent symptoms. For details visit [email or web address].   Also download the MyChart app! Go to the app store, search "MyChart", open the app, select [facility], and log in with your MyChart username and password.  Masks are optional in the cancer centers. If you would like for your care team to wear a mask while they are taking care of you, please let them know. You may have one support person who is at least 53 years old accompany you for your appointments.

## 2022-01-23 NOTE — Progress Notes (Signed)
Patient has been examined by Dr. Katragadda, and vital signs and labs have been reviewed. ANC, Creatinine, LFTs, hemoglobin, and platelets are within treatment parameters per M.D. - pt may proceed with treatment.  Primary RN and pharmacy notified.  

## 2022-01-23 NOTE — Progress Notes (Signed)
Treatment given per orders. Patient tolerated it well without problems. Vitals stable and discharged home from clinic via wheelchair Follow up as scheduled.  

## 2022-01-23 NOTE — Patient Instructions (Addendum)
[place] at [facility] Discharge Instructions   You were seen and examined today by Dr. Delton Coombes.  He reviewed the results of your lab work which are normal/stable.   We will proceed with treatment today.   We will plan to see you after your surgery with Dr. Berline Lopes.    Thank you for choosing [place] at [facility] to provide your oncology and hematology care.  To afford each patient quality time with our provider, please arrive at least 15 minutes before your scheduled appointment time.   If you have a lab appointment with the [facility] please come in thru the Main Entrance and check in at the main information desk.  You need to re-schedule your appointment should you arrive 10 or more minutes late.  We strive to give you quality time with our providers, and arriving late affects you and other patients whose appointments are after yours.  Also, if you no show three or more times for appointments you may be dismissed from the clinic at the providers discretion.     Again, thank you for choosing [facility].  Our hope is that these requests will decrease the amount of time that you wait before being seen by our physicians.       _____________________________________________________________  Should you have questions after your visit to [facility], please contact our office at [phone] and follow the prompts.  Our office hours are 8:00 a.m. and 4:30 p.m. Monday - Friday.  Please note that voicemails left after 4:00 p.m. may not be returned until the following business day.  We are closed weekends and major holidays.  You do have access to a nurse 24-7, just call the main number to the clinic [phone] and do not press any options, hold on the line and a nurse will answer the phone.    For prescription refill requests, have your pharmacy contact our office and allow 72 hours.    Due to Covid, you will  need to wear a mask upon entering the hospital. If you do not have a mask, a mask will be given to you at the Main Entrance upon arrival. For doctor visits, patients may have 1 support person age 53 or older with them. For treatment visits, patients can not have anyone with them due to social distancing guidelines and our immunocompromised population.

## 2022-01-23 NOTE — Progress Notes (Signed)
[place], Nicole Santos   CLINIC:  Medical Oncology/Hematology  PCP:  Lafonda Mosses, MD [address] 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  PRIOR THERAPY: none  NGS Results: not done  CURRENT THERAPY: Carboplatin (AUC 6) / Paclitaxel (175) q21d x 6 cycles  BRIEF ONCOLOGIC HISTORY:  Oncology History  Primary peritoneal carcinomatosis ([place])  11/17/2021 Initial Diagnosis   Primary peritoneal carcinomatosis ([place])   11/21/2021 -  Chemotherapy   Patient is on Treatment Plan : OVARIAN Carboplatin (AUC 6) / Paclitaxel (175) q21d x 6 cycles       CANCER STAGING:  Cancer Staging  Primary peritoneal carcinomatosis ([place]) Staging form: Ovary, Fallopian Tube, and Primary Peritoneal Carcinoma, AJCC 8th Edition - Clinical stage from 11/17/2021: FIGO Stage IIIC (cT3c, cM0) - Unsigned   INTERVAL HISTORY:  Nicole Santos, a 51 y.o. female, returns for follow-up, toxicity assessment and cycle 4 of carboplatin and paclitaxel.  She was seen by Dr. Berline Lopes yesterday.  She had numbness in the face and ears which lasted 2 days.  Numbness in the fingertips and feet not affecting ADLs and IADLs is stable.  She is eating better.   REVIEW OF SYSTEMS:  Review of Systems  Constitutional:  Negative for appetite change, fatigue and unexpected weight change.  Gastrointestinal:  Negative for abdominal distention (improved), constipation and diarrhea.  Genitourinary:  Negative for frequency.   Neurological:  Positive for numbness (In the fingertips and feet stable).  Psychiatric/Behavioral:  Negative for sleep disturbance.   All other systems reviewed and are negative.   PAST MEDICAL/SURGICAL HISTORY:  Past Medical History:  Diagnosis Date   Acute respiratory disease due to COVID-19 virus 02/02/2019   Anxiety    Cancer ([place])    Depression    Diabetes mellitus without  complication (HCC)    Dyspnea    Dysrhythmia    Hypertension    Ovarian cancer ([place]) 11/14/2021   Sleep apnea    doesn't use CPAP   Tachycardia    Past Surgical History:  Procedure Laterality Date   KNEE SURGERY Right    Arthroscopic   LAPAROSCOPIC ABDOMINAL EXPLORATION N/A 11/13/2021   Procedure: Exploratory laparoscopy;  Surgeon: Aviva Signs, MD;  Location: [facility];  Service: General;  Laterality: N/A;   PORTACATH PLACEMENT Left 11/15/2021   Procedure: INSERTION PORT-A-CATH;  Surgeon: Aviva Signs, MD;  Location: [facility];  Service: General;  Laterality: Left;   TONSILLECTOMY AND ADENOIDECTOMY      SOCIAL HISTORY:  Social History   Socioeconomic History   Marital status: Married    Spouse name: Not on file   Number of children: Not on file   Years of education: Not on file   Highest education level: Not on file  Occupational History   Occupation: Unemployed  Tobacco Use   Smoking status: Never   Smokeless tobacco: Never  Vaping Use   Vaping Use: Never used  Substance and Sexual Activity   Alcohol use: No   Drug use: No   Sexual activity: Not Currently  Other Topics Concern   Not on file  Social History Narrative   Not on file   Social Determinants of Health   Financial Resource Strain: Not on file  Food Insecurity: Not on file  Transportation Needs: Not on file  Physical Activity: Not on file  Stress: Not on file  Social Connections: Not  on file  Intimate Partner Violence: Not on file    FAMILY HISTORY:  Family History  Problem Relation Age of Onset   Hypertension Mother    Bladder Cancer Maternal Grandmother    Breast cancer Maternal Great-grandmother    Colon cancer Neg Hx    Ovarian cancer Neg Hx    Endometrial cancer Neg Hx    Pancreatic cancer Neg Hx    Prostate cancer Neg Hx     CURRENT MEDICATIONS:  Current Outpatient Medications  Medication Sig Dispense Refill   aspirin EC 81 MG tablet Take 81 mg by mouth daily.     atorvastatin  (LIPITOR) 20 MG tablet Take 1 tablet (20 mg total) by mouth daily. 90 tablet 1   lidocaine-prilocaine (EMLA) cream Apply to affected area once 30 g 3   magnesium oxide (MAG-OX) 400 (240 Mg) MG tablet Take 1 tablet (400 mg total) by mouth 2 (two) times daily. 60 tablet 2   No current facility-administered medications for this visit.    ALLERGIES:  Allergies  Allergen Reactions   Augmentin [Amoxicillin-Pot Clavulanate] Diarrhea and Nausea And Vomiting   Doxycycline     Abdominal cramps.   Topamax [Topiramate] Nausea And Vomiting    PHYSICAL EXAM:  Performance status (ECOG): 1 - Symptomatic but completely ambulatory  There were no vitals filed for this visit.  Wt Readings from Last 3 Encounters:  01/22/22 189 lb 2 oz (85.8 kg)  12/26/21 203 lb 12.8 oz (92.4 kg)  12/12/21 218 lb 12.8 oz (99.2 kg)   Physical Exam Vitals reviewed.  Constitutional:      Appearance: Normal appearance. She is obese.     Comments: In wheelchair  Cardiovascular:     Rate and Rhythm: Normal rate and regular rhythm.     Pulses: Normal pulses.     Heart sounds: Normal heart sounds.  Pulmonary:     Effort: Pulmonary effort is normal.     Breath sounds: Normal breath sounds.  Abdominal:     Palpations: Abdomen is soft. There is no hepatomegaly, splenomegaly or mass.     Tenderness: There is no abdominal tenderness.  Neurological:     General: No focal deficit present.     Mental Status: She is alert and oriented to person, place, and time.  Psychiatric:        Mood and Affect: Mood normal.        Behavior: Behavior normal.     LABORATORY DATA:  I have reviewed the labs as listed.     Latest Ref Rng & Units 01/22/2022    3:58 PM 01/02/2022    8:37 AM 12/12/2021    8:01 AM  CBC  WBC 4.0 - 10.5 K/uL 4.1  4.7  12.2   Hemoglobin 12.0 - 15.0 g/dL 10.1  9.8  10.9   Hematocrit 36.0 - 46.0 % 30.4  31.1  34.1   Platelets 150 - 400 K/uL 267  382  665       Latest Ref Rng & Units 01/22/2022    3:58 PM  01/02/2022    8:37 AM 12/12/2021    8:01 AM  CMP  Glucose 70 - 99 mg/dL 119  94  124   BUN 6 - 20 mg/dL 7  5  8    Creatinine 0.44 - 1.00 mg/dL 0.68  0.57  0.70   Sodium 135 - 145 mmol/L 137  134  136   Potassium 3.5 - 5.1 mmol/L 3.2  3.3  3.8  Chloride 98 - 111 mmol/L 100  98  102   CO2 22 - 32 mmol/L 27  25  25    Calcium 8.9 - 10.3 mg/dL 9.4  8.4  8.3   Total Protein 6.5 - 8.1 g/dL 7.0  7.3  6.7   Total Bilirubin 0.3 - 1.2 mg/dL 0.9  1.1  1.0   Alkaline Phos 38 - 126 U/L 61  54  67   AST 15 - 41 U/L 26  21  22    ALT 0 - 44 U/L 17  14  16      DIAGNOSTIC IMAGING:  I have independently reviewed the scans and discussed with the patient. CT CHEST ABDOMEN PELVIS W CONTRAST  Result Date: 01/19/2022 CLINICAL DATA:  A 51 year old female presents for evaluation of ovarian cancer, assessment of treatment response. * Tracking Code: BO * EXAM: CT CHEST, ABDOMEN, AND PELVIS WITH CONTRAST TECHNIQUE: Multidetector CT imaging of the chest, abdomen and pelvis was performed following the standard protocol during bolus administration of intravenous contrast. RADIATION DOSE REDUCTION: This exam was performed according to the departmental dose-optimization program which includes automated exposure control, adjustment of the mA and/or kV according to patient size and/or use of iterative reconstruction technique. CONTRAST:  130m OMNIPAQUE IOHEXOL 300 MG/ML  SOLN COMPARISON:  CT of November 13, 2021. FINDINGS: CT CHEST FINDINGS Cardiovascular: LEFT-sided Port-A-Cath terminates at the brachiocephalic/superior vena cava junction. This enters via subclavian approach and has been placed since previous imaging heart size normal without pericardial effusion or nodularity. Small amount of adherent thrombus or fibrin sheath around the catheter in the brachiocephalic vein (image 668/0 Aortic caliber normal with scattered aortic atherosclerosis. Central pulmonary vasculature is unremarkable on venous phase. Mediastinum/Nodes:  Esophagus is grossly normal. There is no adenopathy in the chest. RIGHT thyroid nodule extending posteriorly from the thyroid on the RIGHT measuring 2.3 x 2.1 cm (image 9/2) Lungs/Pleura: No effusion. No consolidative changes. No suspicious pulmonary nodules. Airways are patent. Musculoskeletal: See below for full musculoskeletal details. CT ABDOMEN PELVIS FINDINGS Hepatobiliary: Small low-attenuation area in the inter lobar fissure region associated with medial segment of LEFT hepatic lobe primarily (image 47/2) new from previous imaging 11 mm. Perihepatic fluid is trace and diminished from previous imaging. No additional focal hepatic abnormalities. The portal vein is patent. Cholelithiasis without pericholecystic stranding. No biliary duct dilation. Pancreas: Normal, without mass, inflammation or ductal dilatation. Spleen: Normal size and contour. Subtle stranding along the margin of the spleen with diminished fluid compared to previous imaging. Adrenals/Urinary Tract: Adrenal glands are normal. Symmetric renal enhancement without hydronephrosis or suspicious renal lesion. Urinary bladder is collapsed. 4 mm calculus in the lower pole the RIGHT kidney. Small cyst in the posterior LEFT kidney is unchanged. No additional dedicated imaging follow-up is recommended for this finding. This measures 2.2 x 1.7 cm with a density of 2 Hounsfield units. Stomach/Bowel: Stomach without adjacent stranding. Small bowel without signs of obstruction. Thickening of mesenteric reflections is subjectively diminished compared to previous imaging, difficult to quantify with measurement. Focal concentric narrowing of the rectosigmoid junction (image 93/2) appearance is nonspecific but suspicious for neoplasm potentially related to serosal involvement in the pelvis in this patient with known peritoneal disease from ovarian cancer. Underlying colonic neoplasm would be difficult to exclude rectum is distended below this area colon is  mildly distended above this area. There was no transition in this location on the previous study. Vascular/Lymphatic: Post LEFT common iliac venous stenting as before. Scattered small lymph nodes throughout the retroperitoneum  and upper abdomen, none with pathologic enlargement or change from previous imaging. Reproductive: Thickening of fascial planes about in atrophic appearing uterus. No discrete pelvic mass lesion. Thickening of fascial planes including thickening of the colon posterior to the uterus at this level Other: Extensive omental infiltration is diminished since previous imaging. Difficult to quantify due to infiltrative nature of this process though the omentum and infiltrative changes measuring 2.7 cm greatest thickness without confluent soft tissue, previously nearly confluent soft tissue in this area in the lower anterior abdomen measuring 3.7 cm. Musculoskeletal: Coarsening of trabeculation in the RIGHT hemipelvis greater than LEFT similar to previous imaging. No destructive bone findings or acute bone process. Signs of prior trauma to the LEFT hemithorax with multiple healed rib fractures. IMPRESSION: 1. Small amount of adherent thrombus or fibrin sheath around the catheter in the LEFT brachiocephalic vein. 2. Small low-attenuation area in the inter lobar fissure region associated with medial segment of LEFT hepatic lobe, new from previous imaging, may represent developing "variable fat deposition" though given history of peritoneal disease would suggest attention on subsequent imaging. 3. Diminished thickening of mesenteric reflections, decreased ascites and decreased omental infiltration since previous imaging. 4. Focal concentric narrowing of the rectosigmoid junction is nonspecific but suspicious for neoplasm potentially related to serosal involvement in the pelvis in this patient with known peritoneal disease from ovarian cancer. Underlying colonic neoplasm would be difficult to exclude as  well. Colonic assessment with endoscopy or comparison with recent endoscopy performed is suggested. 5. Cholelithiasis without evidence of acute cholecystitis. 6. 4 mm RIGHT lower pole renal calculus. Aortic Atherosclerosis (ICD10-I70.0). Electronically Signed   By: Zetta Bills M.D.   On: 01/19/2022 12:29     ASSESSMENT:  High-grade serous carcinoma of peritoneum: - She had developed sharp left-sided abdominal pain since Mother's Day, occasional radiating to the back.  40 pound weight loss in the last 6 months. - Personal history of cervical cancer in 2000, status post chemo plus XRT at [facility].  She has slightly decreased hearing in the left ear since then. - US abdomen (11/08/2021): Cholelithiasis with gallbladder wall thickening and sonographic Murphy sign.  Upper abdominal ascites. - Laparoscopy (11/13/2021): Starting of the omentum as well as the upper abdominal wall and diaphragm.  Liver was within normal limits.  6 L of ascites was evacuated.  Omental and peritoneal biopsies were sent.  Peritoneal implants throughout and a significant portion of omentum and on the small intestine. - CT CAP (11/13/2021): Ascites, extensive peritoneal thickening, nodularity and omental caking.  No evidence of primary malignancy in the chest, abdomen or pelvis.  Small left pleural effusion, nonspecific.  Cholelithiasis. - US pelvis (11/14/2021): Uterus is grossly unremarkable.  Ovaries not visualized. - CA125 (11/13/2021): 1269 - Pathology: Peritoneum and omental biopsy consistent with high-grade serous carcinoma.  HER2 2+ by IHC and negative by FISH. - 3 cycles of carboplatin and paclitaxel from 11/21/2021 through 01/02/2022.    Social/family history: - Lives at home with her husband.  Worked in Scientist, research (medical) at [employer].  Non-smoker. - Maternal grandmother with bladder cancer.  Maternal great grandmother with breast cancer.   PLAN:  High-grade serous carcinoma of peritoneum: - She has completed 3 cycles of  chemotherapy.  CA125 improved to 44.  CT CAP (01/18/2022): Showed response to therapy.  She was evaluated by Dr. Berline Lopes yesterday.  She was recommended to have 1 more cycle of chemotherapy prior to surgical debridement.  Tentative day of surgery is 02/20/2022.  We reviewed her labs  today which showed mild hypokalemia.  LFTs are normal.  Proceed with cycle 4 today without any dose modifications.  RTC 1 month after surgery.  2.  Peripheral neuropathy: - Paclitaxel dose was reduced by 20% during cycle 2.       - Constant numbness in the fingertips and toes is stable.  Its not affecting ADLs or IADLs.  3.  Weight loss: - She lost about 20 pounds after cycle 2 and 15 pounds after cycle 1.  Since then weight has been stable.  She is eating better.  4.  Hypomagnesemia: - Magnesium today is normal.  Continue magnesium twice daily.  5.  Hypokalemia: - We will start her on K-Dur 20 mEq daily.   Orders placed this encounter:  No orders of the defined types were placed in this encounter.    Derek Jack, MD [place] [phone]

## 2022-01-24 ENCOUNTER — Other Ambulatory Visit: Payer: Self-pay

## 2022-01-24 ENCOUNTER — Encounter: Payer: Self-pay | Admitting: Hematology

## 2022-01-24 NOTE — Telephone Encounter (Signed)
I called [facility] to follow up on referral for colonoscopy.  I spoke to [place] and she states she will call now and get pt scheduled. She states she "can make it happen" getting pt in before 9/19 surgery date with Dr. Berline Lopes.

## 2022-01-24 NOTE — Telephone Encounter (Signed)
Ms.Reinoso is scheduled for colonoscopy on 9/14

## 2022-01-25 ENCOUNTER — Ambulatory Visit: Payer: Commercial Managed Care - PPO | Admitting: Gastroenterology

## 2022-01-25 ENCOUNTER — Encounter: Payer: Self-pay | Admitting: Gastroenterology

## 2022-01-25 ENCOUNTER — Telehealth: Payer: Self-pay | Admitting: Gastroenterology

## 2022-01-25 ENCOUNTER — Inpatient Hospital Stay: Payer: Commercial Managed Care - PPO

## 2022-01-25 VITALS — BP 111/81 | HR 100 | Temp 97.0°F | Resp 18

## 2022-01-25 VITALS — BP 118/82 | HR 83 | Ht 62.0 in | Wt 194.0 lb

## 2022-01-25 DIAGNOSIS — R933 Abnormal findings on diagnostic imaging of other parts of digestive tract: Secondary | ICD-10-CM

## 2022-01-25 DIAGNOSIS — C482 Malignant neoplasm of peritoneum, unspecified: Secondary | ICD-10-CM

## 2022-01-25 DIAGNOSIS — Z5111 Encounter for antineoplastic chemotherapy: Secondary | ICD-10-CM | POA: Diagnosis not present

## 2022-01-25 MED ORDER — PEGFILGRASTIM INJECTION 6 MG/0.6ML ~~LOC~~
6.0000 mg | PREFILLED_SYRINGE | Freq: Once | SUBCUTANEOUS | Status: AC
  Administered 2022-01-25: 6 mg via SUBCUTANEOUS
  Filled 2022-01-25: qty 0.6

## 2022-01-25 NOTE — Telephone Encounter (Signed)
[place] can you let the patient know that I discussed her case with Dr. Berline Lopes.  It would be really useful for them to know what is going on in her colon in relation to those findings prior to her surgery, as it could influence how they approach her case surgically.  In this light colonoscopy is recommended before the surgery.  She already has an appointment scheduled for September 14, can you clarify with the patient when that date is in relation to her scheduled chemotherapy.  I would just want to make sure her white blood cell count is normal and not neutropenic prior to the exam.  We can check a CBC a day or 2 prior to her scheduled colonoscopy.  We can keep it on the 14th if that is her preference or can move it up to a different date if openings, I just want to make sure it is separated from her chemotherapy as much as it can be.  If the patient is willing can you please order bowel preparation and go through preprocedure counseling with her.  Thanks

## 2022-01-25 NOTE — Progress Notes (Signed)
HPI :  51 year old female with a history of remote cervical cancer, recent diagnosis of peritoneal carcinomatosis, abnormal CT scan of her colon, referred here by Dr. Gaylyn Rong, MD for consideration of colonoscopy.  This is her first time to our office.  She is accompanied by her husband today.  She explains a history of cervical cancer around 2000, was treated with chemotherapy and radiation for that.  Unfortunately over time she had an ultrasound this past June which showed new onset ascites, and a CT scan showing peritoneal thickening, and nodularity.  She underwent an ex lap which unfortunately confirmed a high-grade serous carcinoma, peritoneal carcinomatosis.  She has been followed by Gyn Onc and oncology, started on chemotherapy to include carboplatin and paclitaxel, has scheduled tentative surgical debridement planned later in September.  She had a follow-up CT scan on August 17 as outlined below.  Her peritoneal findings appear improved on chemotherapy.  In her colon there is focal concentric narrowing of the rectosigmoid junction, nonspecific appearance.  The patient has never had a prior colonoscopy.  She generally denies any problems with her bowels at all.  At first when she started chemotherapy she had constipation but that has since resolved.  She has no straining.  No blood in her stools.  She has no family history of colon cancer.  She denies any cardiopulmonary symptoms.  She had an echocardiogram in 2018 which looked okay.  She is in a wheelchair she states due to fatigue and neuropathy from the chemotherapy.  She is on what sounds like Neupogen injections for her regimen, her white blood cell count fluctuated but she has not been neutropenic.  She denies any pains in her abdomen.  She historically has had some reflux but that is not bothering her now.  Overall she understands the CT scan findings, she has discussed colonoscopy with her other physicians.  If she really needs to  have it she will although she hopes to avoid this if possible.  Prior workup: [place] 11/08/21 showing ascites and gallstones CT CAP 11/13/21 - Ascites, extensive peritoneal thickening, nodularity and omental caking.  No evidence of primary malignancy in the chest, abdomen or pelvis.  Small left pleural effusion, nonspecific.  Cholelithiasis.  Ex lap 11/14/11 - peritoneal implants of small intestine, omentum, ascites, studding of omentum and abdominal wall. Pathology: Peritoneum and omental biopsy consistent with high-grade serous carcinoma.  HER2 2+ by IHC and negative by FISH. Serous carcinoma of the peritoneum   On chemotherapy - carboplatin / paclitaxel - surgical debridement planned later for September  Echocardiogram 09/2016 - EF 55-60%,    CT 01/18/22:IMPRESSION: 1. Small amount of adherent thrombus or fibrin sheath around the catheter in the LEFT brachiocephalic vein. 2. Small low-attenuation area in the inter lobar fissure region associated with medial segment of LEFT hepatic lobe, new from previous imaging, may represent developing "variable fat deposition" though given history of peritoneal disease would suggest attention on subsequent imaging. 3. Diminished thickening of mesenteric reflections, decreased ascites and decreased omental infiltration since previous imaging. 4. Focal concentric narrowing of the rectosigmoid junction is nonspecific but suspicious for neoplasm potentially related to serosal involvement in the pelvis in this patient with known peritoneal disease from ovarian cancer. Underlying colonic neoplasm would be difficult to exclude as well. Colonic assessment with endoscopy or comparison with recent endoscopy performed is suggested. 5. Cholelithiasis without evidence of acute cholecystitis. 6. 4 mm RIGHT lower pole renal calculus.      Past Medical History:  Diagnosis Date   Acute respiratory disease due to COVID-19 virus 02/02/2019   Anxiety    Cancer ([place])     Depression    Diabetes mellitus without complication ([place])    Dyspnea    Dysrhythmia    Hypertension    Ovarian cancer ([place]) 11/14/2021   Peritoneal carcinomatosis ([place])    Sleep apnea    doesn't use CPAP   Tachycardia      Past Surgical History:  Procedure Laterality Date   KNEE SURGERY Right    Arthroscopic   LAPAROSCOPIC ABDOMINAL EXPLORATION N/A 11/13/2021   Procedure: Exploratory laparoscopy;  Surgeon: Aviva Signs, MD;  Location: [facility];  Service: General;  Laterality: N/A;   PORTACATH PLACEMENT Left 11/15/2021   Procedure: INSERTION PORT-A-CATH;  Surgeon: Aviva Signs, MD;  Location: [facility];  Service: General;  Laterality: Left;   TONSILLECTOMY AND ADENOIDECTOMY     Family History  Problem Relation Age of Onset   Hypertension Mother    Bladder Cancer Maternal Grandmother    Breast cancer Maternal Great-grandmother    Colon cancer Neg Hx    Ovarian cancer Neg Hx    Endometrial cancer Neg Hx    Pancreatic cancer Neg Hx    Prostate cancer Neg Hx    Social History   Tobacco Use   Smoking status: Never   Smokeless tobacco: Never  Vaping Use   Vaping Use: Never used  Substance Use Topics   Alcohol use: No   Drug use: No   Current Outpatient Medications  Medication Sig Dispense Refill   aspirin EC 81 MG tablet Take 81 mg by mouth daily.     atorvastatin (LIPITOR) 20 MG tablet Take 1 tablet (20 mg total) by mouth daily. 90 tablet 1   lidocaine-prilocaine (EMLA) cream Apply to affected area once 30 g 3   magnesium oxide (MAG-OX) 400 (240 Mg) MG tablet Take 1 tablet (400 mg total) by mouth 2 (two) times daily. 60 tablet 2   potassium chloride SA (KLOR-CON M) 20 MEQ tablet Take 1 tablet (20 mEq total) by mouth daily. 90 tablet 3   No current facility-administered medications for this visit.   Allergies  Allergen Reactions   Augmentin [Amoxicillin-Pot Clavulanate] Diarrhea and Nausea And Vomiting   Doxycycline     Abdominal cramps.   Topamax [Topiramate]  Nausea And Vomiting     Review of Systems: All systems reviewed and negative except where noted in HPI.    CT CHEST ABDOMEN PELVIS W CONTRAST  Result Date: 01/19/2022 CLINICAL DATA:  A 51 year old female presents for evaluation of ovarian cancer, assessment of treatment response. * Tracking Code: BO * EXAM: CT CHEST, ABDOMEN, AND PELVIS WITH CONTRAST TECHNIQUE: Multidetector CT imaging of the chest, abdomen and pelvis was performed following the standard protocol during bolus administration of intravenous contrast. RADIATION DOSE REDUCTION: This exam was performed according to the departmental dose-optimization program which includes automated exposure control, adjustment of the mA and/or kV according to patient size and/or use of iterative reconstruction technique. CONTRAST:  123m OMNIPAQUE IOHEXOL 300 MG/ML  SOLN COMPARISON:  CT of November 13, 2021. FINDINGS: CT CHEST FINDINGS Cardiovascular: LEFT-sided Port-A-Cath terminates at the brachiocephalic/superior vena cava junction. This enters via subclavian approach and has been placed since previous imaging heart size normal without pericardial effusion or nodularity. Small amount of adherent thrombus or fibrin sheath around the catheter in the brachiocephalic vein (image 638/1 Aortic caliber normal with scattered aortic atherosclerosis. Central pulmonary vasculature is unremarkable on venous phase.  Mediastinum/Nodes: Esophagus is grossly normal. There is no adenopathy in the chest. RIGHT thyroid nodule extending posteriorly from the thyroid on the RIGHT measuring 2.3 x 2.1 cm (image 9/2) Lungs/Pleura: No effusion. No consolidative changes. No suspicious pulmonary nodules. Airways are patent. Musculoskeletal: See below for full musculoskeletal details. CT ABDOMEN PELVIS FINDINGS Hepatobiliary: Small low-attenuation area in the inter lobar fissure region associated with medial segment of LEFT hepatic lobe primarily (image 47/2) new from previous imaging 11 mm.  Perihepatic fluid is trace and diminished from previous imaging. No additional focal hepatic abnormalities. The portal vein is patent. Cholelithiasis without pericholecystic stranding. No biliary duct dilation. Pancreas: Normal, without mass, inflammation or ductal dilatation. Spleen: Normal size and contour. Subtle stranding along the margin of the spleen with diminished fluid compared to previous imaging. Adrenals/Urinary Tract: Adrenal glands are normal. Symmetric renal enhancement without hydronephrosis or suspicious renal lesion. Urinary bladder is collapsed. 4 mm calculus in the lower pole the RIGHT kidney. Small cyst in the posterior LEFT kidney is unchanged. No additional dedicated imaging follow-up is recommended for this finding. This measures 2.2 x 1.7 cm with a density of 2 Hounsfield units. Stomach/Bowel: Stomach without adjacent stranding. Small bowel without signs of obstruction. Thickening of mesenteric reflections is subjectively diminished compared to previous imaging, difficult to quantify with measurement. Focal concentric narrowing of the rectosigmoid junction (image 93/2) appearance is nonspecific but suspicious for neoplasm potentially related to serosal involvement in the pelvis in this patient with known peritoneal disease from ovarian cancer. Underlying colonic neoplasm would be difficult to exclude rectum is distended below this area colon is mildly distended above this area. There was no transition in this location on the previous study. Vascular/Lymphatic: Post LEFT common iliac venous stenting as before. Scattered small lymph nodes throughout the retroperitoneum and upper abdomen, none with pathologic enlargement or change from previous imaging. Reproductive: Thickening of fascial planes about in atrophic appearing uterus. No discrete pelvic mass lesion. Thickening of fascial planes including thickening of the colon posterior to the uterus at this level Other: Extensive omental  infiltration is diminished since previous imaging. Difficult to quantify due to infiltrative nature of this process though the omentum and infiltrative changes measuring 2.7 cm greatest thickness without confluent soft tissue, previously nearly confluent soft tissue in this area in the lower anterior abdomen measuring 3.7 cm. Musculoskeletal: Coarsening of trabeculation in the RIGHT hemipelvis greater than LEFT similar to previous imaging. No destructive bone findings or acute bone process. Signs of prior trauma to the LEFT hemithorax with multiple healed rib fractures. IMPRESSION: 1. Small amount of adherent thrombus or fibrin sheath around the catheter in the LEFT brachiocephalic vein. 2. Small low-attenuation area in the inter lobar fissure region associated with medial segment of LEFT hepatic lobe, new from previous imaging, may represent developing "variable fat deposition" though given history of peritoneal disease would suggest attention on subsequent imaging. 3. Diminished thickening of mesenteric reflections, decreased ascites and decreased omental infiltration since previous imaging. 4. Focal concentric narrowing of the rectosigmoid junction is nonspecific but suspicious for neoplasm potentially related to serosal involvement in the pelvis in this patient with known peritoneal disease from ovarian cancer. Underlying colonic neoplasm would be difficult to exclude as well. Colonic assessment with endoscopy or comparison with recent endoscopy performed is suggested. 5. Cholelithiasis without evidence of acute cholecystitis. 6. 4 mm RIGHT lower pole renal calculus. Aortic Atherosclerosis (ICD10-I70.0). Electronically Signed   By: Zetta Bills M.D.   On: 01/19/2022 12:29  Lab Results  Component Value Date   WBC 3.3 (L) 01/23/2022   HGB 9.3 (L) 01/23/2022   HCT 29.6 (L) 01/23/2022   MCV 90.0 01/23/2022   PLT 262 01/23/2022    Lab Results  Component Value Date   CREATININE 0.58 01/23/2022    BUN 10 01/23/2022   NA 136 01/23/2022   K 3.2 (L) 01/23/2022   CL 102 01/23/2022   CO2 25 01/23/2022    Lab Results  Component Value Date   ALT 18 01/23/2022   AST 20 01/23/2022   ALKPHOS 55 01/23/2022   BILITOT 1.3 (H) 01/23/2022     Physical Exam: BP 118/82   Pulse 83   Ht 5' 2"  (1.575 m)   Wt 194 lb (88 kg)   SpO2 99%   BMI 35.48 kg/m  Constitutional: Pleasant, female in no acute distress, in wheelchair  Cardiovascular: Normal rate, regular rhythm.  Pulmonary/chest: Effort normal and breath sounds normal. No wheezing, rales or rhonchi. Abdominal: Soft, nondistended, nontender.  There are no masses palpable.  Extremities: no edema Neurological: Alert and oriented to person place and time. Skin: Skin is warm and dry. No rashes noted. Psychiatric: Normal mood and affect. Behavior is normal.   ASSESSMENT: 51 y.o. female here for assessment of the following  1. Abnormal CT scan, gastrointestinal tract   2. Primary peritoneal carcinomatosis [facility])    Patient with a relatively new diagnosis of peritoneal carcinomatosis, with remote history of cervical cancer treated with chemotherapy and radiation.  She has had a few cycles of chemotherapy and her disease burden appears improved on follow-up CT scan although she has some concentric thickening of her rectosigmoid colon on CT scan, she has never had a prior colonoscopy.  Discussed differential diagnosis for this finding with her.  She currently denies any bowel problems.  Certainly she is at risk for radiation changes of her colon, although colon polyp or mass lesion otherwise or implant of her carcinomatosis could potentially look like this as well.  The patient really does not want to have a colonoscopy but she is willing to do so if this would change the management of her malignancy.  I have reviewed her records, I will touch base with Dr. Berline Lopes and Dr. Delton Coombes about her case.  If they feel strongly she needs a colonoscopy  prior to her pending surgery in September the patient is willing to do it.  I discussed risk benefits of colonoscopy and anesthesia with her and she understands this, is willing to proceed if needed.  I would want to check her blood counts a day or 2 before the procedure to make sure no neutropenia etc while she is on chemotherapy.  I will follow-up with her to confirm scheduling once I hear back from her oncology team.  PLAN: - patient willing to do a colonoscopy if recommended by her Oncology team. I will confirm this with them as the patient really wants to avoid this otherwise if possible. If this will influence her management I would recommend proceeding with colonoscopy or at least flex sig to evaluate her rectosigmoid colon. I will contact her with final recommendation after I touch base with her oncology team. Would check labs just before colonoscopy to ensure stable otherwise while on chemotherapy  Jolly Mango, MD [facility]  CC: Lafonda Mosses, MD

## 2022-01-25 NOTE — Patient Instructions (Signed)
[facility]  Discharge Instructions: Thank you for choosing [facility] to provide your oncology and hematology care.  If you have a lab appointment with the [facility], please come in thru the Main Entrance and check in at the main information desk.  Wear comfortable clothing and clothing appropriate for easy access to any Portacath or PICC line.   We strive to give you quality time with your provider. You may need to reschedule your appointment if you arrive late (15 or more minutes).  Arriving late affects you and other patients whose appointments are after yours.  Also, if you miss three or more appointments without notifying the office, you may be dismissed from the clinic at the provider's discretion.      For prescription refill requests, have your pharmacy contact our office and allow 72 hours for refills to be completed.    Today you received the following chemotherapy and/or immunotherapy agents Neaulasta injection.       To help prevent nausea and vomiting after your treatment, we encourage you to take your nausea medication as directed.  BELOW ARE SYMPTOMS THAT SHOULD BE REPORTED IMMEDIATELY: *FEVER GREATER THAN 100.4 F (38 C) OR HIGHER *CHILLS OR SWEATING *NAUSEA AND VOMITING THAT IS NOT CONTROLLED WITH YOUR NAUSEA MEDICATION *UNUSUAL SHORTNESS OF BREATH *UNUSUAL BRUISING OR BLEEDING *URINARY PROBLEMS (pain or burning when urinating, or frequent urination) *BOWEL PROBLEMS (unusual diarrhea, constipation, pain near the anus) TENDERNESS IN MOUTH AND THROAT WITH OR WITHOUT PRESENCE OF ULCERS (sore throat, sores in mouth, or a toothache) UNUSUAL RASH, SWELLING OR PAIN  UNUSUAL VAGINAL DISCHARGE OR ITCHING   Items with * indicate a potential emergency and should be followed up as soon as possible or go to the Emergency Department if any problems should occur.  Please show the CHEMOTHERAPY ALERT CARD or IMMUNOTHERAPY ALERT CARD at check-in to  the Emergency Department and triage nurse.  Should you have questions after your visit or need to cancel or reschedule your appointment, please contact [facility] [phone]  and follow the prompts.  Office hours are 8:00 a.m. to 4:30 p.m. Monday - Friday. Please note that voicemails left after 4:00 p.m. may not be returned until the following business day.  We are closed weekends and major holidays. You have access to a nurse at all times for urgent questions. Please call the main number to the clinic [phone] and follow the prompts.  For any non-urgent questions, you may also contact your provider using MyChart. We now offer e-Visits for anyone 56 and older to request care online for non-urgent symptoms. For details visit [email or web address].   Also download the MyChart app! Go to the app store, search "MyChart", open the app, select [facility], and log in with your MyChart username and password.  Masks are optional in the cancer centers. If you would like for your care team to wear a mask while they are taking care of you, please let them know. You may have one support person who is at least 53 years old accompany you for your appointments.

## 2022-01-25 NOTE — Progress Notes (Signed)
Nicole Santos presents today for injection per the provider's orders.  Neulasta administration without incident; injection site WNL; see MAR for injection details.  Patient tolerated procedure well and without incident.  No questions or complaints noted at this time. Discharged from clinic by wheel chair in stable condition. Alert and oriented x 3. F/U with [facility] as scheduled.

## 2022-01-26 ENCOUNTER — Telehealth: Payer: Self-pay | Admitting: *Deleted

## 2022-01-26 ENCOUNTER — Encounter (HOSPITAL_COMMUNITY)
Admission: RE | Admit: 2022-01-26 | Discharge: 2022-01-26 | Disposition: A | Discharge status: Home / Self Care | Payer: Commercial Managed Care - PPO | Source: Ambulatory Visit | Attending: Anesthesiology | Admitting: Anesthesiology

## 2022-01-26 DIAGNOSIS — Z01818 Encounter for other preprocedural examination: Secondary | ICD-10-CM

## 2022-01-26 MED ORDER — NA SULFATE-K SULFATE-MG SULF 17.5-3.13-1.6 GM/177ML PO SOLN: 1.0000 | Freq: Once | ORAL | 0 refills | Status: AC

## 2022-01-26 NOTE — Telephone Encounter (Signed)
Called and spoke with patient regarding Dr. Doyne Keel recommendations. Pt states that her last chemo appt was on 01/23/22. Pt states that she is OK with keeping colonoscopy scheduled on 02/15/22. Pt states that she is a diabetic but she is not currently taking any medications. No anticoagulation therapy. Pt would like her prep sent to [facility] in [place]. She is aware that I will mail and send her instructions via MyChart. Pt verbalized understanding and had no concerns at the end of the call.  Ambulatory referral to GI in epic. Instructions mailed and sent to patient via MyChart. Suprep sent to pharmacy on file.

## 2022-01-26 NOTE — Addendum Note (Signed)
Addended by: Yevette Edwards on: 01/26/2022 09:07 AM   Modules accepted: Orders

## 2022-01-26 NOTE — Telephone Encounter (Signed)
Per Dr Berline Lopes sent surgical optimization to the patient's PCP

## 2022-01-30 DIAGNOSIS — C579 Malignant neoplasm of female genital organ, unspecified: Secondary | ICD-10-CM

## 2022-01-30 DIAGNOSIS — R180 Malignant ascites: Secondary | ICD-10-CM

## 2022-01-31 ENCOUNTER — Encounter: Payer: Self-pay | Admitting: Gastroenterology

## 2022-02-02 ENCOUNTER — Other Ambulatory Visit: Payer: Self-pay

## 2022-02-04 ENCOUNTER — Other Ambulatory Visit: Payer: Self-pay | Admitting: Hematology

## 2022-02-04 DIAGNOSIS — C482 Malignant neoplasm of peritoneum, unspecified: Secondary | ICD-10-CM

## 2022-02-06 ENCOUNTER — Encounter: Payer: Self-pay | Admitting: Family Medicine

## 2022-02-08 ENCOUNTER — Encounter: Payer: Self-pay | Admitting: Family Medicine

## 2022-02-08 ENCOUNTER — Ambulatory Visit (INDEPENDENT_AMBULATORY_CARE_PROVIDER_SITE_OTHER): Payer: Commercial Managed Care - PPO | Admitting: Family Medicine

## 2022-02-08 VITALS — BP 118/80 | HR 114 | Temp 97.9°F | Resp 20 | Ht 62.0 in | Wt 183.0 lb

## 2022-02-08 DIAGNOSIS — E43 Unspecified severe protein-calorie malnutrition: Secondary | ICD-10-CM

## 2022-02-08 DIAGNOSIS — R432 Parageusia: Secondary | ICD-10-CM

## 2022-02-08 DIAGNOSIS — E1169 Type 2 diabetes mellitus with other specified complication: Secondary | ICD-10-CM | POA: Diagnosis not present

## 2022-02-08 DIAGNOSIS — Z01818 Encounter for other preprocedural examination: Secondary | ICD-10-CM | POA: Diagnosis not present

## 2022-02-08 DIAGNOSIS — E1165 Type 2 diabetes mellitus with hyperglycemia: Secondary | ICD-10-CM | POA: Diagnosis not present

## 2022-02-08 DIAGNOSIS — E785 Hyperlipidemia, unspecified: Secondary | ICD-10-CM

## 2022-02-08 DIAGNOSIS — R000 Tachycardia, unspecified: Secondary | ICD-10-CM

## 2022-02-08 LAB — BAYER DCA HB A1C WAIVED: HB A1C (BAYER DCA - WAIVED): 5.2 % (ref 4.8–5.6)

## 2022-02-08 MED ORDER — BENEPROTEIN PO PACK: 1.0000 | PACK | Freq: Three times a day (TID) | ORAL | 2 refills | Status: DC

## 2022-02-08 MED ORDER — METOPROLOL SUCCINATE ER 25 MG PO TB24: 12.5000 mg | ORAL_TABLET | Freq: Every day | ORAL | 2 refills | Status: DC

## 2022-02-08 MED ORDER — ATORVASTATIN CALCIUM 20 MG PO TABS: 20.0000 mg | ORAL_TABLET | Freq: Every day | ORAL | 1 refills | Status: DC

## 2022-02-08 NOTE — Progress Notes (Signed)
Pt is a 51 y.o. female who is here for preoperative clearance for exploratory laparotomy, omentectomy with tumor debulking, possible bowel resection, possible hysterectomy abdominal, possible bilateral salpingo-oophorectomy scheduled for 02/20/2022 with Dr. Jeral Pinch.  1) High Risk Cardiac Conditions  1) Recent MI - No.  2) Decompensated Heart Failure - No.  3) Unstable angina - No.  4) Symptomatic arrythmia - No.  5) Sx Valvular Disease - No.  2) Intermediate Risk Factors - DM, CKD, CVA, CHF, CAD - Yes.    2) Functional Status - > 4 mets (Walk, run, climb stairs) No. [facility] Activity Status Index: 3.62  3) Surgery Specific Risk - High (intra-abdominal)  4) Further Noninvasive evaluation - 01/22/2022  1) EKG - Yes.   - completed on    1) Hx of CVA, CAD, DM, CKD  2) Echo - No.   1) Worsening dyspnea   3) Stress Testing - Active Cardiac Disease - No.  5) Need for medical therapy - Beta Blocker, Statins indicated ? No.  PE: Vitals:   02/08/22 1405  BP: 118/80  Pulse: (!) 114  Resp: 20  Temp: 97.9 F (36.6 C)  SpO2: 99%   Physical Exam Vitals reviewed.  Constitutional:      General: She is not in acute distress.    Appearance: Normal appearance. She is obese. She is not ill-appearing, toxic-appearing or diaphoretic.  HENT:     Head: Normocephalic and atraumatic.     Right Ear: Tympanic membrane, ear canal and external ear normal. There is no impacted cerumen.     Left Ear: Tympanic membrane, ear canal and external ear normal. There is no impacted cerumen.     Nose: Nose normal. No congestion or rhinorrhea.     Mouth/Throat:     Mouth: Mucous membranes are moist.     Pharynx: Oropharynx is clear. No oropharyngeal exudate or posterior oropharyngeal erythema.  Eyes:     General: No scleral icterus.       Right eye: No discharge.        Left eye: No discharge.     Conjunctiva/sclera: Conjunctivae normal.     Pupils: Pupils are equal, round, and reactive to light.   Cardiovascular:     Rate and Rhythm: Normal rate and regular rhythm.     Heart sounds: Normal heart sounds. No murmur heard.    No friction rub. No gallop.  Pulmonary:     Effort: Pulmonary effort is normal. No respiratory distress.     Breath sounds: Normal breath sounds. No stridor. No wheezing, rhonchi or rales.  Abdominal:     General: Abdomen is flat. Bowel sounds are normal. There is no distension.     Palpations: Abdomen is soft. There is no hepatomegaly, splenomegaly or mass.     Tenderness: There is no abdominal tenderness. There is no guarding or rebound.     Hernia: No hernia is present.  Musculoskeletal:        General: Normal range of motion.     Cervical back: Normal range of motion and neck supple. No rigidity. No muscular tenderness.  Lymphadenopathy:     Cervical: No cervical adenopathy.  Skin:    General: Skin is warm and dry.     Capillary Refill: Capillary refill takes less than 2 seconds.  Neurological:     General: No focal deficit present.     Mental Status: She is alert and oriented to person, place, and time. Mental status is at baseline.  Motor: Weakness (riding in wheelchair) present.  Psychiatric:        Mood and Affect: Mood normal.        Behavior: Behavior normal.        Thought Content: Thought content normal.        Judgment: Judgment normal.     1. Pre-op exam Clearance pending lab work.  - CBC with Differential/Platelet - CMP14+EGFR - Bayer DCA Hb A1c Waived - EKG 12-Lead  I have independently evaluated patient.  Nicole Santos is a 52 y.o. female who is moderate risk for a high risk surgery.  There are not modifiable risk factors (smoking, etc). Nicole Santos's RCRI/NSQIP calculation for MACE is: 6%.    2. Type 2 diabetes mellitus with hyperglycemia, without long-term current use of insulin (HCC) Resolved. Patient has been off medication x3 months and her A1c remains normal at 5.4.  - CBC with Differential/Platelet - CMP14+EGFR - Bayer  DCA Hb A1c Waived - atorvastatin (LIPITOR) 20 MG tablet; Take 1 tablet (20 mg total) by mouth daily.  Dispense: 90 tablet; Refill: 1  3. Hyperlipidemia associated with type 2 diabetes mellitus (HCC) - atorvastatin (LIPITOR) 20 MG tablet; Take 1 tablet (20 mg total) by mouth daily.  Dispense: 90 tablet; Refill: 1  4. Tachycardia Started metoprolol 12.5 mg daily. - metoprolol succinate (TOPROL-XL) 25 MG 24 hr tablet; Take 0.5 tablets (12.5 mg total) by mouth daily.  Dispense: 15 tablet; Refill: 2  5-6. Severe malnutrition (HCC)/Dysgeusia Encouraged Beneprotein as she is hardly eating anything due to cancer treatments causing food to smell and taste repulsive.  - Beneprotein PACK; Take 1 Scoop (6 g total) by mouth 3 (three) times daily.  Dispense: 90 each; Refill: [place], MSN, APRN, FNP-C [facility]

## 2022-02-09 ENCOUNTER — Encounter: Payer: Self-pay | Admitting: Hematology

## 2022-02-09 LAB — CMP14+EGFR
ALT: 11 IU/L (ref 0–32)
AST: 24 IU/L (ref 0–40)
Albumin/Globulin Ratio: 1.6 (ref 1.2–2.2)
Albumin: 4 g/dL (ref 3.8–4.9)
Alkaline Phosphatase: 81 IU/L (ref 44–121)
BUN/Creatinine Ratio: 8 — ABNORMAL LOW (ref 9–23)
BUN: 4 mg/dL — ABNORMAL LOW (ref 6–24)
Bilirubin Total: 0.8 mg/dL (ref 0.0–1.2)
CO2: 21 mmol/L (ref 20–29)
Calcium: 9.1 mg/dL (ref 8.7–10.2)
Chloride: 95 mmol/L — ABNORMAL LOW (ref 96–106)
Creatinine, Ser: 0.51 mg/dL — ABNORMAL LOW (ref 0.57–1.00)
Globulin, Total: 2.5 g/dL (ref 1.5–4.5)
Glucose: 66 mg/dL — ABNORMAL LOW (ref 70–99)
Potassium: 3.7 mmol/L (ref 3.5–5.2)
Sodium: 139 mmol/L (ref 134–144)
Total Protein: 6.5 g/dL (ref 6.0–8.5)
eGFR: 112 mL/min/{1.73_m2} (ref >59)

## 2022-02-09 LAB — CBC WITH DIFFERENTIAL/PLATELET
Basophils Absolute: 0 10*3/uL (ref 0.0–0.2)
Basos: 0 %
EOS (ABSOLUTE): 0.1 10*3/uL (ref 0.0–0.4)
Eos: 1 %
Hematocrit: 27 % — ABNORMAL LOW (ref 34.0–46.6)
Hemoglobin: 9 g/dL — ABNORMAL LOW (ref 11.1–15.9)
Immature Grans (Abs): 0 10*3/uL (ref 0.0–0.1)
Immature Granulocytes: 1 %
Lymphocytes Absolute: 1.2 10*3/uL (ref 0.7–3.1)
Lymphs: 17 %
MCH: 29.4 pg (ref 26.6–33.0)
MCHC: 33.3 g/dL (ref 31.5–35.7)
MCV: 88 fL (ref 79–97)
Monocytes Absolute: 0.5 10*3/uL (ref 0.1–0.9)
Monocytes: 7 %
Neutrophils Absolute: 5.6 10*3/uL (ref 1.4–7.0)
Neutrophils: 74 %
Platelets: 79 10*3/uL — CL (ref 150–450)
RBC: 3.06 x10E6/uL — ABNORMAL LOW (ref 3.77–5.28)
RDW: 23.4 % — ABNORMAL HIGH (ref 11.7–15.4)
WBC: 7.4 10*3/uL (ref 3.4–10.8)

## 2022-02-10 NOTE — Progress Notes (Incomplete)
COVID Vaccine received:  '[]'$  No '[]'$  Yes Date of any COVID positive Test in last 90 days:  PCP - Hendricks Limes, FNP Clearance   02-08-22 note Cardiologist - none Oncology - Derek Jack, MD   [facility]  Chest x-ray - 11-15-21  Epic EKG -  01-22-2022  Epic Stress Test -  ECHO - 09-17-2016  Epic Cardiac Cath -  CT Chest - 11-13-2021  Epic  Pacemaker/ICD device     '[]'$  N/A Spinal Cord Stimulator:'[]'$  No '[]'$  Yes      (Remind patient to bring remote DOS) Other Implants:   Bowel Prep - Flagyl, Dulcolax, Miralax etc  History of Sleep Apnea? '[]'$  No '[x]'$  Yes   Sleep Study Date:   CPAP used?- '[x]'$  No '[]'$  Yes    Does the patient monitor blood sugar? '[]'$  No '[]'$  Yes  '[]'$  N/A Does patient have a [employer] or Dexacom? '[]'$  No '[]'$  Yes   Fasting Blood Sugar Ranges-  Checks Blood Sugar _____ times a day  Blood Thinner Instructions: Aspirin Instructions: Last Dose:  ERAS Protocol Ordered: '[]'$  No  '[x]'$  Yes PRE-SURGERY '[]'$  ENSURE  '[x]'$  G2   Comments: 02-08-22  Patient had CMP, CBC diff, and A1C (5.2)  Activity level: Patient can / can not climb a flight of stairs without difficulty;  '[]'$  No CP  '[]'$  No SOB,  but would have ______   Anesthesia review: CKD, DOE, tachycardia  Patient denies shortness of breath, fever, cough and chest pain at PAT appointment.  Patient verbalized understanding and agreement to the Pre-Surgical Instructions that were given to them at this PAT appointment. Patient was also educated of the need to review these PAT instructions again prior to his/her surgery.I reviewed the appropriate phone numbers to call if they have any and questions or concerns.

## 2022-02-10 NOTE — Patient Instructions (Addendum)
SURGICAL WAITING ROOM VISITATION Patients having surgery or a procedure may have no more than 2 support people in the waiting area - these visitors may rotate in the visitor waiting room.   Children under the age of 1 must have an adult with them who is not the patient. If the patient needs to stay at the hospital during part of their recovery, the visitor guidelines for inpatient rooms apply.  PRE-OP VISITATION  Pre-op nurse will coordinate an appropriate time for 1 support person to accompany the patient in pre-op.  This support person may not rotate.  This visitor will be contacted when the time is appropriate for the visitor to come back in the pre-op area.  Please refer to the [facility] website for the visitor guidelines for Inpatients (after your surgery is over and you are in a regular room).  You are not required to quarantine at this time prior to your surgery. However, you must do this: Hand Hygiene often Do NOT share personal items Notify your provider if you are in close contact with someone who has COVID or you develop fever 100.4 or greater, new onset of sneezing, cough, sore throat, shortness of breath or body aches.       Your procedure is scheduled on:  Tuesday  February 20, 2022  Report to [facility] Main Entrance.  Report to admitting at:  12:45  PM  +++++Call this number if you have any questions or problems the morning of surgery [phone]   FOLLOW BOWEL PREP AND ANY ADDITIONAL PRE OP INSTRUCTIONS YOU RECEIVED FROM YOUR SURGEON'S OFFICE!!!  COLON BOWEL PREP      FIVE DAYS PRIOR TO YOUR SURGERY    Obtain supplies for the bowel prep at a pharmacy of your choice:  Office sent prescription for your antibiotic pills (Flagyl)   A bottle of Miralax (238 g)- no prescription required  A large bottle of Gatorade/Powerade (64 oz), avoid red/purple coloring- no prescription required  Dulcolax tablets (4 tablets)- no prescription required     Change your  diet to make the bowel prep go more easily:  Switch to a bland, low fiber diet  Stop eating any nuts, popcorn, or fruit with seeds.  Stop all fiber supplements such as Metamucil, Miralax, etc.       Improve nutrition:  Consider drinking 2-3 nutritional shakes (Ex: Ensure Surgery) every day, starting 5 days prior to surgery       DAY PRIOR TO SURGERY     Switch to a full liquid diet the day before surgery  Drink plenty of liquids all day to avoid getting dehydrated           Dulcolax 20 mg - Take four (4) tablets with water at 07:00 am the day prior to surgery.  Miralax 255 g - Mix with 64 oz Gatorade/Powerade.  Starting at 10:00 am ,Drink this gradually over the next few hours (8 oz glass every 15-30 minutes) until gone the day prior to surgery You should finish in 4 hours-5 hours.    Metronidazole 1000 mg - At 2 pm, 3 pm and 10 pm after Miralax bowel prep the day prior to surgery.   Drink plenty of clear liquids all evening to avoid getting dehydrated.    Do not eat food :After Midnight the night prior to your surgery/procedure.  After Midnight you may have the following liquids until   12:00 noon the DAY OF SURGERY Clear Liquid Diet Water Black Coffee (sugar ok,  NO MILK/CREAM OR CREAMERS)  Tea (sugar ok, NO MILK/CREAM OR CREAMERS) regular and decaf                             Plain Jell-O (NO RED)                                           Fruit ices (not with fruit pulp, NO RED)                                     Popsicles (NO RED)                                                                  Juice: apple, WHITE grape, WHITE cranberry Sports drinks like Gatorade (NO RED)                    The day of surgery:  Drink ONE (1) Pre-Surgery G2 at  12:00 noon the day of surgery. Drink in one sitting. Do not sip.  This drink was given to you during your hospital pre-op appointment visit. Nothing else to drink after completing the Pre-Surgery  G2 : No candy, chewing or throat  lozenges.    Oral Hygiene is also important to reduce your risk of infection.        Remember - BRUSH YOUR TEETH THE MORNING OF SURGERY WITH YOUR REGULAR TOOTHPASTE  Do NOT smoke after Midnight the night before surgery.  Take ONLY these medicines the morning of surgery with A SIP OF WATER: Metropolol                 You may not have any metal on your body including hair pins, jewelry, and body piercing  Do not wear make-up, lotions, powders, perfumes or deodorant  Do not wear nail polish including gel and S&S, artificial / acrylic nails, or any other type of covering on natural nails including finger and toenails. If you have artificial nails, gel coating, etc., that needs to be removed by a nail salon, Please have this removed prior to surgery. Not doing so may mean that your surgery could be cancelled or delayed if the Surgeon or anesthesia staff feels like they are unable to monitor you safely.   Do not shave 48 hours prior to surgery to avoid nicks in your skin which may contribute to postoperative infections.   Contacts, Hearing Aids, dentures or bridgework may not be worn into surgery.   You may bring a small overnight bag with you on the day of surgery, only pack items that are not valuable .[facility] IS NOT RESPONSIBLE   FOR VALUABLES THAT ARE LOST OR STOLEN.   DO NOT [place]. PHARMACY WILL DISPENSE MEDICATIONS LISTED ON YOUR MEDICATION LIST TO YOU DURING YOUR ADMISSION [place]!   Special Instructions: Bring a copy of your healthcare power of attorney and living will documents the day of surgery, if you wish to have them scanned into your [facility]  Medical Records- EPIC  Please read over the following fact sheets you were given: IF YOU HAVE QUESTIONS ABOUT YOUR PRE-OP INSTRUCTIONS, PLEASE CALL [phone]  ([place])   [place] - Preparing for Surgery Before surgery, you can play an important role.  Because skin is not sterile, your  skin needs to be as free of germs as possible.  You can reduce the number of germs on your skin by washing with CHG (chlorahexidine gluconate) soap before surgery.  CHG is an antiseptic cleaner which kills germs and bonds with the skin to continue killing germs even after washing. Please DO NOT use if you have an allergy to CHG or antibacterial soaps.  If your skin becomes reddened/irritated stop using the CHG and inform your nurse when you arrive at Short Stay. Do not shave (including legs and underarms) for at least 48 hours prior to the first CHG shower.  You may shave your face/neck.  Please follow these instructions carefully:  1.  Shower with CHG Soap the night before surgery and the  morning of surgery.  2.  If you choose to wash your hair, wash your hair first as usual with your normal  shampoo.  3.  After you shampoo, rinse your hair and body thoroughly to remove the shampoo.                             4.  Use CHG as you would any other liquid soap.  You can apply chg directly to the skin and wash.  Gently with a scrungie or clean washcloth.  5.  Apply the CHG Soap to your body ONLY FROM THE NECK DOWN.   Do not use on face/ open                           Wound or open sores. Avoid contact with eyes, ears mouth and genitals (private parts).                       Wash face,  Genitals (private parts) with your normal soap.             6.  Wash thoroughly, paying special attention to the area where your  surgery  will be performed.  7.  Thoroughly rinse your body with warm water from the neck down.  8.  DO NOT shower/wash with your normal soap after using and rinsing off the CHG Soap.            9.  Pat yourself dry with a clean towel.            10.  Wear clean pajamas.            11.  Place clean sheets on your bed the night of your first shower and do not  sleep with pets.  ON THE DAY OF SURGERY : Do not apply any lotions/deodorants the morning of surgery.  Please wear clean clothes to  the hospital/surgery center.    FAILURE TO FOLLOW THESE INSTRUCTIONS MAY RESULT IN THE CANCELLATION OF YOUR SURGERY  PATIENT SIGNATURE_________________________________  NURSE SIGNATURE__________________________________  ________________________________________________________________________      WHAT IS A BLOOD TRANSFUSION? Blood Transfusion Information  A transfusion is the replacement of blood or some of its parts. Blood is made up of multiple cells which provide different functions. Red blood cells carry oxygen and are used for blood  loss replacement. White blood cells fight against infection. Platelets control bleeding. Plasma helps clot blood. Other blood products are available for specialized needs, such as hemophilia or other clotting disorders. BEFORE THE TRANSFUSION  Who gives blood for transfusions?  Healthy volunteers who are fully evaluated to make sure their blood is safe. This is blood bank blood. Transfusion therapy is the safest it has ever been in the practice of medicine. Before blood is taken from a donor, a complete history is taken to make sure that person has no history of diseases nor engages in risky social behavior (examples are intravenous drug use or sexual activity with multiple partners). The donor's travel history is screened to minimize risk of transmitting infections, such as malaria. The donated blood is tested for signs of infectious diseases, such as HIV and hepatitis. The blood is then tested to be sure it is compatible with you in order to minimize the chance of a transfusion reaction. If you or a relative donates blood, this is often done in anticipation of surgery and is not appropriate for emergency situations. It takes many days to process the donated blood. RISKS AND COMPLICATIONS Although transfusion therapy is very safe and saves many lives, the main dangers of transfusion include:  Getting an infectious disease. Developing a transfusion  reaction. This is an allergic reaction to something in the blood you were given. Every precaution is taken to prevent this. The decision to have a blood transfusion has been considered carefully by your caregiver before blood is given. Blood is not given unless the benefits outweigh the risks. AFTER THE TRANSFUSION Right after receiving a blood transfusion, you will usually feel much better and more energetic. This is especially true if your red blood cells have gotten low (anemic). The transfusion raises the level of the red blood cells which carry oxygen, and this usually causes an energy increase. The nurse administering the transfusion will monitor you carefully for complications. HOME CARE INSTRUCTIONS  No special instructions are needed after a transfusion. You may find your energy is better. Speak with your caregiver about any limitations on activity for underlying diseases you may have. SEEK MEDICAL CARE IF:  Your condition is not improving after your transfusion. You develop redness or irritation at the intravenous (IV) site. SEEK IMMEDIATE MEDICAL CARE IF:  Any of the following symptoms occur over the next 12 hours: Shaking chills. You have a temperature by mouth above 102 F (38.9 C), not controlled by medicine. Chest, back, or muscle pain. People around you feel you are not acting correctly or are confused. Shortness of breath or difficulty breathing. Dizziness and fainting. You get a rash or develop hives. You have a decrease in urine output. Your urine turns a dark color or changes to pink, red, or brown. Any of the following symptoms occur over the next 10 days: You have a temperature by mouth above 102 F (38.9 C), not controlled by medicine. Shortness of breath. Weakness after normal activity. The white part of the eye turns yellow (jaundice). You have a decrease in the amount of urine or are urinating less often. Your urine turns a dark color or changes to pink, red, or  brown. Document Released: 05/18/2000 Document Revised: 08/13/2011 Document Reviewed: 01/05/2008 [facility] Patient Information 2014 [place], [place].  _______________________________________________________________________

## 2022-02-10 NOTE — Progress Notes (Signed)
COVID Vaccine received:  '[x]'$  No '[]'$  Yes Date of any COVID positive Test in last 90 days: none  PCP - Hendricks Limes, FNP  Clearance   02-08-22 note Cardiologist - Dr. Carlyle Dolly ([place] 2017 for SOB) Oncology - Derek Jack, MD   [facility]  Chest x-ray - 11-15-21  Epic EKG -  01-22-2022  Epic Stress Test -  ECHO - 09-17-2016  Epic Cardiac Cath -  CT Chest - 11-13-2021  Epic  Pacemaker/ICD device     '[x]'$  N/A Spinal Cord Stimulator:'[x]'$  No '[]'$  Yes      Other Implants: Has Portacath on the left side of her chest.  Bowel Prep -Hold instructions for bowel prep per Joylene John, NP. Her office is trying to rearrange Mrs. Prestage's colonoscopy prep in relation to her surgery prep since the two cases are only 5 days apart. Mrs. Marcella understands that she won't follow the bowel prep in our PST instruction but will wait until she talks to Dr. Charisse March office.    History of Sleep Apnea? '[x]'$  No '[]'$  Yes   Sleep Study Date:  ,mild OSA per patient CPAP used?- '[x]'$  No '[]'$  Yes    Does the patient monitor blood sugar? '[]'$  No '[x]'$  Yes  '[]'$  N/A Does patient have a [employer] or Dexacom? '[x]'$  No '[]'$  Yes   Fasting Blood Sugar Ranges- 90-123 Checks Blood Sugar __3  times a day  Patient's CBG at her PST appt was only 70. I offered to get her something to eat or drink and she refused. She stated that she is unable to eat hardly anything that doesn't make her nauseous. I cautioned her that she needs to try an eat to keep her Blood sugar higher.  Blood Thinner Instructions: none Aspirin Instructions: ASA 81 mg - she will take ASA up to the day before surgery.  Last Dose:  ERAS Protocol Ordered: '[]'$  No  '[x]'$  Yes PRE-SURGERY '[]'$  ENSURE  '[x]'$  G2   Comments: 02-08-22  Patient had CMP, CBC diff, and A1C (5.2)  Activity level: Patient can not climb a flight of stairs without difficulty, she uses a walker and wheelchair.    Anesthesia review: CKD, DOE, Pre-DM (no meds), HTN, tachycardia  Patient  denies fever, cough, and chest pain at PAT appointment. She is having some weakness and SOB.   Patient verbalized understanding and agreement to the Pre-Surgical Instructions that were given to them at this PAT appointment. Patient was also educated of the need to review these PAT instructions again prior to his/her surgery.I reviewed the appropriate phone numbers to call if they have any and questions or concerns.

## 2022-02-13 ENCOUNTER — Encounter (HOSPITAL_COMMUNITY): Payer: Self-pay

## 2022-02-13 ENCOUNTER — Telehealth: Payer: Self-pay | Admitting: Gynecologic Oncology

## 2022-02-13 ENCOUNTER — Other Ambulatory Visit: Payer: Self-pay

## 2022-02-13 ENCOUNTER — Encounter (HOSPITAL_COMMUNITY)
Admission: RE | Admit: 2022-02-13 | Discharge: 2022-02-13 | Disposition: A | Discharge status: Home / Self Care | Payer: Commercial Managed Care - PPO | Source: Ambulatory Visit | Attending: Gynecologic Oncology | Admitting: Gynecologic Oncology

## 2022-02-13 ENCOUNTER — Encounter: Payer: Self-pay | Admitting: Gynecologic Oncology

## 2022-02-13 ENCOUNTER — Inpatient Hospital Stay: Payer: Commercial Managed Care - PPO | Attending: Gynecologic Oncology | Admitting: Gynecologic Oncology

## 2022-02-13 VITALS — BP 102/61 | HR 93 | Temp 98.2°F | Resp 16 | Ht 62.0 in | Wt 183.0 lb

## 2022-02-13 DIAGNOSIS — C579 Malignant neoplasm of female genital organ, unspecified: Secondary | ICD-10-CM | POA: Insufficient documentation

## 2022-02-13 DIAGNOSIS — R180 Malignant ascites: Secondary | ICD-10-CM | POA: Diagnosis not present

## 2022-02-13 DIAGNOSIS — C800 Disseminated malignant neoplasm, unspecified: Secondary | ICD-10-CM

## 2022-02-13 DIAGNOSIS — Z01818 Encounter for other preprocedural examination: Secondary | ICD-10-CM | POA: Insufficient documentation

## 2022-02-13 HISTORY — DX: Pneumonia, unspecified organism: J18.9

## 2022-02-13 LAB — GLUCOSE, CAPILLARY: Glucose-Capillary: 70 mg/dL (ref 70–99)

## 2022-02-13 MED ORDER — METRONIDAZOLE 500 MG PO TABS: ORAL_TABLET | ORAL | 0 refills | Status: DC

## 2022-02-13 MED ORDER — BISACODYL 5 MG PO TBEC: DELAYED_RELEASE_TABLET | ORAL | 0 refills | Status: DC

## 2022-02-13 MED ORDER — POLYETHYLENE GLYCOL 3350 17 GM/SCOOP PO POWD: ORAL | 0 refills | Status: DC

## 2022-02-13 MED ORDER — TRAMADOL HCL 50 MG PO TABS: 50.0000 mg | ORAL_TABLET | Freq: Four times a day (QID) | ORAL | 0 refills | Status: DC | PRN

## 2022-02-13 NOTE — Patient Instructions (Addendum)
Preparing for your Surgery  Plan for surgery on February 20, 2022 with Dr. Jeral Pinch at [place] will be scheduled for exploratory laparotomy (larger incision on your abdomen), omentectomy, debulking, possible total abdominal hysterectomy (removal of the uterus and cervix), possible bilateral salpingo-oophorectomy (removal of both ovaries and fallopian tubes), possible bowel resection.   Pre-operative Testing -(Done, today at 2 pm) You will receive a phone call from presurgical testing at [facility] to arrange for a pre-operative appointment and lab work.  -Bring your insurance card, copy of an advanced directive if applicable, medication list  -At that visit, you will be asked to sign a consent for a possible blood transfusion in case a transfusion becomes necessary during surgery.  The need for a blood transfusion is rare but having consent is a necessary part of your care.     -You can keep taking your baby aspirin with the last dose being the day before surgery.  -Do not take supplements such as fish oil (omega 3), red yeast rice, turmeric before your surgery. You want to avoid medications with aspirin in them including headache powders such as BC or Goody's), Excedrin migraine.  Day Before Surgery at Home -Follow instructions for bowel prep. You will be advised you can have clear liquids up until 3 hours before your surgery.    Avoid gas producing liquids such as carbonated beverages. If your bowels are filled with gas, your surgeon will have difficulty visualizing your pelvic organs which increases your surgical risks.  Your role in recovery Your role is to become active as soon as directed by your doctor, while still giving yourself time to heal.  Rest when you feel tired. You will be asked to do the following in order to speed your recovery:  - Cough and breathe deeply. This helps to clear and expand your lungs and can prevent pneumonia after surgery.   - [place]. Do mild physical activity. Walking or moving your legs help your circulation and body functions return to normal. Do not try to get up or walk alone the first time after surgery.   -If you develop swelling on one leg or the other, pain in the back of your leg, redness/warmth in one of your legs, please call the office or go to the Emergency Room to have a doppler to rule out a blood clot. For shortness of breath, chest pain-seek care in the Emergency Room as soon as possible. - Actively manage your pain. Managing your pain lets you move in comfort. We will ask you to rate your pain on a scale of zero to 10. It is your responsibility to tell your doctor or nurse where and how much you hurt so your pain can be treated.  Special Considerations -If you are diabetic, you may be placed on insulin after surgery to have closer control over your blood sugars to promote healing and recovery.  This does not mean that you will be discharged on insulin.  If applicable, your oral antidiabetics will be resumed when you are tolerating a solid diet.  -Your final pathology results from surgery should be available around one week after surgery and the results will be relayed to you when available.  -Dr. Lahoma Crocker is the surgeon that assists your GYN Oncologist with surgery.  If you end up staying the night, the next day after your surgery you will either see Dr. Berline Lopes or Dr. Lahoma Crocker.  -FMLA forms  can be faxed to [phone] and please allow 5-7 business days for completion.  Pain Management After Surgery -You have been prescribed your pain medication and bowel regimen medications before surgery so that you can have these available when you are discharged from the hospital. The pain medication is for use ONLY AFTER surgery and a new prescription will not be given.   -Make sure that you have Tylenol and Ibuprofen IF YOU ARE ABLE TO TAKE THESE MEDICATIONS at home to  use on a regular basis after surgery for pain control. We recommend alternating the medications every hour to six hours since they work differently and are processed in the body differently for pain relief.  -Review the attached handout on narcotic use and their risks and side effects.   Bowel Regimen -You have been prescribed Sennakot-S to take nightly to prevent constipation especially if you are taking the narcotic pain medication intermittently.  It is important to prevent constipation and drink adequate amounts of liquids. You can stop taking this medication when you are not taking pain medication and you are back on your normal bowel routine.  Risks of Surgery Risks of surgery are low but include bleeding, infection, damage to surrounding structures, re-operation, blood clots, and very rarely death.   Blood Transfusion Information (For the consent to be signed before surgery)  We will be checking your blood type before surgery so in case of emergencies, we will know what type of blood you would need.                                            WHAT IS A BLOOD TRANSFUSION?  A transfusion is the replacement of blood or some of its parts. Blood is made up of multiple cells which provide different functions. Red blood cells carry oxygen and are used for blood loss replacement. White blood cells fight against infection. Platelets control bleeding. Plasma helps clot blood. Other blood products are available for specialized needs, such as hemophilia or other clotting disorders. BEFORE THE TRANSFUSION  Who gives blood for transfusions?  You may be able to donate blood to be used at a later date on yourself (autologous donation). Relatives can be asked to donate blood. This is generally not any safer than if you have received blood from a stranger. The same precautions are taken to ensure safety when a relative's blood is donated. Healthy volunteers who are fully evaluated to make sure their  blood is safe. This is blood bank blood. Transfusion therapy is the safest it has ever been in the practice of medicine. Before blood is taken from a donor, a complete history is taken to make sure that person has no history of diseases nor engages in risky social behavior (examples are intravenous drug use or sexual activity with multiple partners). The donor's travel history is screened to minimize risk of transmitting infections, such as malaria. The donated blood is tested for signs of infectious diseases, such as HIV and hepatitis. The blood is then tested to be sure it is compatible with you in order to minimize the chance of a transfusion reaction. If you or a relative donates blood, this is often done in anticipation of surgery and is not appropriate for emergency situations. It takes many days to process the donated blood. RISKS AND COMPLICATIONS Although transfusion therapy is very safe and saves many lives, the  main dangers of transfusion include:  Getting an infectious disease. Developing a transfusion reaction. This is an allergic reaction to something in the blood you were given. Every precaution is taken to prevent this. The decision to have a blood transfusion has been considered carefully by your caregiver before blood is given. Blood is not given unless the benefits outweigh the risks.  AFTER SURGERY INSTRUCTIONS  Return to work: 4-6 weeks if applicable  You will have a white honeycomb dressing over your larger incision. This dressing can be removed 5 days after surgery and you do not need to reapply a new dressing. Once you remove the dressing, you will notice that you have the surgical glue (dermabond) on the incision and this will peel off on its own. You can get this dressing wet in the shower the days after surgery prior to removal on the 5th day.   You will most likely be recommended to begin taking once daily blood thinner tablets for four weeks AFTER surgery to prevent blood  clots.   Activity: 1. Be up and out of the bed during the day.  Take a nap if needed.  You may walk up steps but be careful and use the hand rail.  Stair climbing will tire you more than you think, you may need to stop part way and rest.   2. No lifting or straining for 6 weeks over 10 pounds. No pushing, pulling, straining for 6 weeks.  3. No driving for around 2 week(s).  Do not drive if you are taking narcotic pain medicine and make sure that your reaction time has returned.   4. You can shower as soon as the next day after surgery. Shower daily.  Use your regular soap and water (not directly on the incision) and pat your incision(s) dry afterwards; don't rub.  No tub baths or submerging your body in water until cleared by your surgeon. If you have the soap that was given to you by pre-surgical testing that was used before surgery, you do not need to use it afterwards because this can irritate your incisions.   5. No sexual activity and nothing in the vagina for 8-10 weeks if you have a hysterectomy.  6. You may experience a small amount of clear drainage from your incisions, which is normal.  If the drainage persists, increases, or changes color please call the office.  7. Do not use creams, lotions, or ointments such as neosporin on your incisions after surgery until advised by your surgeon because they can cause removal of the dermabond glue on your incisions.    8. You may experience vaginal spotting after surgery or around the 6-8 week mark from surgery when the stitches at the top of the vagina begin to dissolve if you have a hysterectomy.  The spotting is normal but if you experience heavy bleeding, call our office.  9. Take Tylenol or ibuprofen first for pain if you are able to take these medications and only use narcotic pain medication for severe pain not relieved by the Tylenol or Ibuprofen.  Monitor your Tylenol intake to a max of 4,000 mg in a 24 hour period. You can alternate  these medications after surgery.  Diet: 1. Low sodium Heart Healthy Diet is recommended but you are cleared to resume your normal (before surgery) diet after your procedure.  2. It is safe to use a laxative, such as Miralax or Colace, if you have difficulty moving your bowels. You have been prescribed  Sennakot-S to take at bedtime every evening after surgery to keep bowel movements regular and to prevent constipation.    Wound Care: 1. Keep clean and dry.  Shower daily.  Reasons to call the Doctor: Fever - Oral temperature greater than 100.4 degrees Fahrenheit Foul-smelling vaginal discharge Difficulty urinating Nausea and vomiting Increased pain at the site of the incision that is unrelieved with pain medicine. Difficulty breathing with or without chest pain New calf pain especially if only on one side Sudden, continuing increased vaginal bleeding with or without clots.   Contacts: For questions or concerns you should contact:  Dr. Jeral Pinch at [phone]  Joylene John, NP at [phone]  After Hours: call [phone] and have the GYN Oncologist paged/contacted (after 5 pm or on the weekends).  Messages sent via mychart are for non-urgent matters and are not responded to after hours so for urgent needs, please call the after hours number.   COLON BOWEL PREP     FIVE DAYS PRIOR TO YOUR SURGERY   Obtain supplies for the bowel prep at a pharmacy of your choice: Office sent prescription for your antibiotic pills (Flagyl)  A bottle of Miralax (238 g)- prescription has been sent in A large bottle of Gatorade/Powerade (64 oz), avoid red/purple coloring- no prescription required Dulcolax tablets (4 tablets)-prescription has been sent in   Change your diet to make the bowel prep go more easily: Switch to a bland, low fiber diet Stop eating any nuts, popcorn, or fruit with seeds.  Stop all fiber supplements such as Metamucil, Miralax, etc.     Improve  nutrition: Consider drinking 2-3 nutritional shakes (Ex: Ensure Surgery) every day, starting 5 days prior to surgery     DAY PRIOR TO SURGERY    Switch to a full liquid diet the day before surgery Drink plenty of liquids all day to avoid getting dehydrated      7:00 am Swallow 4 dulcolax tablets with some water   10:00 am Mix the bottle of Miralax with the 64 ounces of Gatorade (G 2 or powerade zero) Drink the Gatorade/Miralax mixture gradually (8 oz glass every 15 minutes) until gone. (You should finish in 4 hours)   2:00pm Take 2 Flagyl (total of 1000 mg) tablets   3:00pm Take 2 Flagyl (total of 1000 mg) tablets Drink plenty of clear liquids all evening to avoid getting dehydrated   10:00pm Take 2 Flagyl (total of 1000 mg) tablets Drink 2 Carbohydrate loading nutrition drinks (ex: Ensure Presurgery). These will be given at pre-op appointment. Do not eat anything solid after bedtime (midnight) the night before your surgery.   BUT DO drink plenty of clear liquids (Water, Gatorade, juice, soda, coffee, tea, broths, etc.) up to 3 hours prior to surgery to avoid getting dehydrated.      MORNING OF SURGERY   Remember to not to eat anything solid that morning  Drink one final carbohydrate loading nutritional drink (ex: Ensure Presurgery) upon waking up in the morning (needs to be 3 hours before your surgery). Hold or take medications as recommended by the hospital staff at your Preoperative visit Stop drinking liquids before you leave the house (>3 hours prior to surgery)

## 2022-02-13 NOTE — Progress Notes (Signed)
Patient here with her husband for a pre-operative appointment prior to her scheduled surgery on February 20, 2022. She is scheduled for exploratory laparotomy, omentectomy, debulking, possible total abdominal hysterectomy, possible bilateral salpingo-oophorectomy, possible bowel resection.  She has her pre-admission testing appointment after this appt today at [facility].  The surgery was discussed in detail.  The patient voiced having the impression her surgery would be attempted laparoscopically with robotic assistance. Phoned Dr. Berline Lopes in the [place] for further clarification. Patient informed of Dr. Charisse March input: She can start the case by doing a diagnostic laparoscopy but there is a very high chance the surgery will be through an open incision given her recent imaging with cancer distribution, in order to attempt to remove remaining cancer in the abdomen and pelvis, along with her history of pelvic radiation and the potential for scarring/adhesions in the pelvis.  The patient voices concern about the potential bowel resection, stating this was not discussed with her at her previous appt. She has a colonoscopy scheduled for Thursday of this week and is concerned about having to have two bowel preps so close together. She does not have issues having bowel movements and feels the changes to her colon are radiation related and she would not like to have unnecessary procedures. Advised these concerns would be discussed with Dr. Berline Lopes and she would be contacted with an update. Discussed the risks of surgery as well.    Discussed post-op pain management in detail including the aspects of the enhanced recovery pathway.  Advised her that a new prescription would be sent in for tramadol and it is only to be used for after her upcoming surgery.  We discussed the use of tylenol post-op and to monitor for a maximum of 4,000 mg in a 24 hour period. Discussed bowel regimen and she will plan on taking dulcolax at home if  needed. She states she does not have regular bowel movements, does not pass regular flatus due to decreased po intake. She can be easily turned off from food by the smell and has issues with the taste of food.       Discussed measures to take at home to prevent DVT including frequent mobility. She has had a DVT in the past and is aware of the symptoms to watch for. Discussed the potential for needing post-op anticoagulation. She would prefer oral tablets vs injections. Reportable signs and symptoms of DVT discussed. Post-operative instructions discussed and expectations for after surgery. Incisional care discussed as well including reportable signs and symptoms including erythema, drainage, wound separation.     10 minutes spent with the patient. Advised our office would follow up with her about the above concerns. Advised patient and family to call for any needs in between that time.  This appointment is included in the global surgical bundle as pre-operative teaching and has no charge.

## 2022-02-13 NOTE — Telephone Encounter (Signed)
Called the patient to follow-up on some of her questions from her visit today.  She is scheduled for colonoscopy later this week and surgery next week.  She is concerned about 2 bowel preps in such a short period of time, which is very reasonable.  I will reach out to her gastroenterologist to see if we can move her colonoscopy to the day before surgery.  This would avoid needing a second bowel prep.  She and I discussed in detail again why I would like her to have a colonoscopy.  While I am suspicious that the findings on her CT scan represent radiation changes, I think that preoperative assessment of the colon to help confirm this would be helpful. We also discussed the route for surgery.  She and I had previously discussed the possibility of robotic surgery when we first met.  Based on the amount of disease that is still seen on her most recent imaging, I would recommend an open approach.    Jeral Pinch MD Gynecologic Oncology

## 2022-02-14 ENCOUNTER — Telehealth: Payer: Self-pay

## 2022-02-14 ENCOUNTER — Encounter (HOSPITAL_COMMUNITY): Payer: Self-pay | Admitting: Physician Assistant

## 2022-02-14 NOTE — Telephone Encounter (Signed)
-----   Message from Yetta Flock, MD sent at 02/14/2022  8:30 AM EDT ----- Regarding: RE: mutual patient Hi, Thanks for reaching out. She is scheduled for tomorrow with me. I do have an appointment accommodate her on 9/19 if that is her preference.   Latrell Reitan can you contact the patient and she which day she would like to do her procedure? Thanks  Richardson Landry ----- Message ----- From: Lafonda Mosses, MD Sent: 02/13/2022   6:48 PM EDT To: Dorothyann Gibbs, NP; Yetta Flock, MD Subject: RE: mutual patient                             Alfonso Patten, I think that this patient is going to need a colon prep for surgery, in the event that I have to do any sort of colon resection (which I am really hoping not to do).  Is there any chance that we could move her colonoscopy to the day before her surgery (9/19)? Thanks so much, Wendelyn Breslow  ----- Message ----- From: Yetta Flock, MD Sent: 01/25/2022  12:16 PM EDT To: Derek Jack, MD; # Subject: mutual patient                                 Hello, Just wanted to touch base about this mutual patient. I saw her today for possible colonoscopy given her imaging findings. The patient is willing to do it if you strongly recommend it, but otherwise is hoping to avoid any invasive procedures if at all possible while she is on chemotherapy. Dr. Berline Lopes I am assuming you would want this done prior to your surgery, as if she had a colon cancer, etc, this would change your plan? Patient wanted me to touch base with you both so that we are all on the same page prior to proceeding with her colonoscopy. Thanks,  Richardson Landry

## 2022-02-14 NOTE — Telephone Encounter (Signed)
Per Dr Berline Lopes faxed surgical optimization to PCP.

## 2022-02-14 NOTE — Telephone Encounter (Signed)
Surgery is scheduled for 9/19, colonoscopy would need to be moved to 02/19/22.   Called and spoke with patient that we can reschedule her colonoscopy appt to Monday, 02/19/22 so that she did not have to do an additional prep for her surgery on 02/20/22. Pt is OK with rescheduling to 02/19/22 at 3:30 pm. Pt is aware that she will need to arrive on the 4th floor by 2:30 pm with a care partner. I told pt that I will send her updated instruction via [place]. Pt verbalized understanding and had no concerns at the end of the call.

## 2022-02-15 ENCOUNTER — Encounter: Payer: Commercial Managed Care - PPO | Admitting: Gastroenterology

## 2022-02-16 ENCOUNTER — Emergency Department (HOSPITAL_COMMUNITY): Payer: Commercial Managed Care - PPO

## 2022-02-16 ENCOUNTER — Other Ambulatory Visit: Payer: Self-pay

## 2022-02-16 ENCOUNTER — Encounter: Payer: Self-pay | Admitting: Gynecologic Oncology

## 2022-02-16 ENCOUNTER — Telehealth: Payer: Self-pay | Admitting: Family Medicine

## 2022-02-16 ENCOUNTER — Encounter (HOSPITAL_COMMUNITY): Payer: Self-pay

## 2022-02-16 ENCOUNTER — Inpatient Hospital Stay (HOSPITAL_COMMUNITY)
Admission: EM | Admit: 2022-02-16 | Discharge: 2022-02-21 | DRG: 641 | Disposition: A | Discharge status: Home / Self Care | Payer: Commercial Managed Care - PPO | Attending: Family Medicine | Admitting: Family Medicine

## 2022-02-16 ENCOUNTER — Telehealth: Payer: Self-pay

## 2022-02-16 DIAGNOSIS — R55 Syncope and collapse: Secondary | ICD-10-CM | POA: Diagnosis not present

## 2022-02-16 DIAGNOSIS — Z8616 Personal history of COVID-19: Secondary | ICD-10-CM

## 2022-02-16 DIAGNOSIS — Z8543 Personal history of malignant neoplasm of ovary: Secondary | ICD-10-CM

## 2022-02-16 DIAGNOSIS — D630 Anemia in neoplastic disease: Secondary | ICD-10-CM | POA: Diagnosis present

## 2022-02-16 DIAGNOSIS — E871 Hypo-osmolality and hyponatremia: Secondary | ICD-10-CM | POA: Diagnosis not present

## 2022-02-16 DIAGNOSIS — E876 Hypokalemia: Secondary | ICD-10-CM

## 2022-02-16 DIAGNOSIS — D61818 Other pancytopenia: Secondary | ICD-10-CM | POA: Diagnosis present

## 2022-02-16 DIAGNOSIS — R000 Tachycardia, unspecified: Secondary | ICD-10-CM | POA: Diagnosis present

## 2022-02-16 DIAGNOSIS — R300 Dysuria: Secondary | ICD-10-CM | POA: Diagnosis present

## 2022-02-16 DIAGNOSIS — Z881 Allergy status to other antibiotic agents status: Secondary | ICD-10-CM

## 2022-02-16 DIAGNOSIS — F32A Depression, unspecified: Secondary | ICD-10-CM | POA: Diagnosis present

## 2022-02-16 DIAGNOSIS — Z8052 Family history of malignant neoplasm of bladder: Secondary | ICD-10-CM

## 2022-02-16 DIAGNOSIS — E860 Dehydration: Secondary | ICD-10-CM | POA: Diagnosis present

## 2022-02-16 DIAGNOSIS — D6481 Anemia due to antineoplastic chemotherapy: Secondary | ICD-10-CM | POA: Diagnosis present

## 2022-02-16 DIAGNOSIS — I10 Essential (primary) hypertension: Secondary | ICD-10-CM | POA: Diagnosis present

## 2022-02-16 DIAGNOSIS — Z9221 Personal history of antineoplastic chemotherapy: Secondary | ICD-10-CM

## 2022-02-16 DIAGNOSIS — Z7982 Long term (current) use of aspirin: Secondary | ICD-10-CM

## 2022-02-16 DIAGNOSIS — F419 Anxiety disorder, unspecified: Secondary | ICD-10-CM | POA: Diagnosis present

## 2022-02-16 DIAGNOSIS — I9589 Other hypotension: Secondary | ICD-10-CM | POA: Diagnosis present

## 2022-02-16 DIAGNOSIS — R630 Anorexia: Secondary | ICD-10-CM | POA: Diagnosis present

## 2022-02-16 DIAGNOSIS — Z28310 Unvaccinated for covid-19: Secondary | ICD-10-CM

## 2022-02-16 DIAGNOSIS — E669 Obesity, unspecified: Secondary | ICD-10-CM | POA: Diagnosis present

## 2022-02-16 DIAGNOSIS — E785 Hyperlipidemia, unspecified: Secondary | ICD-10-CM | POA: Diagnosis present

## 2022-02-16 DIAGNOSIS — Z888 Allergy status to other drugs, medicaments and biological substances status: Secondary | ICD-10-CM

## 2022-02-16 DIAGNOSIS — R112 Nausea with vomiting, unspecified: Secondary | ICD-10-CM | POA: Diagnosis present

## 2022-02-16 DIAGNOSIS — E872 Acidosis, unspecified: Secondary | ICD-10-CM

## 2022-02-16 DIAGNOSIS — C482 Malignant neoplasm of peritoneum, unspecified: Secondary | ICD-10-CM | POA: Diagnosis present

## 2022-02-16 DIAGNOSIS — T451X5A Adverse effect of antineoplastic and immunosuppressive drugs, initial encounter: Secondary | ICD-10-CM | POA: Diagnosis present

## 2022-02-16 DIAGNOSIS — Z803 Family history of malignant neoplasm of breast: Secondary | ICD-10-CM

## 2022-02-16 DIAGNOSIS — E861 Hypovolemia: Secondary | ICD-10-CM | POA: Diagnosis present

## 2022-02-16 DIAGNOSIS — Z86718 Personal history of other venous thrombosis and embolism: Secondary | ICD-10-CM

## 2022-02-16 DIAGNOSIS — Z6836 Body mass index (BMI) 36.0-36.9, adult: Secondary | ICD-10-CM

## 2022-02-16 DIAGNOSIS — Z8249 Family history of ischemic heart disease and other diseases of the circulatory system: Secondary | ICD-10-CM

## 2022-02-16 DIAGNOSIS — E782 Mixed hyperlipidemia: Secondary | ICD-10-CM

## 2022-02-16 DIAGNOSIS — Z79899 Other long term (current) drug therapy: Secondary | ICD-10-CM

## 2022-02-16 LAB — COMPREHENSIVE METABOLIC PANEL
ALT: 27 U/L (ref 0–44)
AST: 38 U/L (ref 15–41)
Albumin: 3.5 g/dL (ref 3.5–5.0)
Alkaline Phosphatase: 68 U/L (ref 38–126)
Anion gap: 19 — ABNORMAL HIGH (ref 5–15)
BUN: 7 mg/dL (ref 6–20)
CO2: 19 mmol/L — ABNORMAL LOW (ref 22–32)
Calcium: 8.3 mg/dL — ABNORMAL LOW (ref 8.9–10.3)
Chloride: 93 mmol/L — ABNORMAL LOW (ref 98–111)
Creatinine, Ser: 0.68 mg/dL (ref 0.44–1.00)
GFR, Estimated: >60 mL/min (ref >60)
Glucose, Bld: 134 mg/dL — ABNORMAL HIGH (ref 70–99)
Potassium: 3.3 mmol/L — ABNORMAL LOW (ref 3.5–5.1)
Sodium: 131 mmol/L — ABNORMAL LOW (ref 135–145)
Total Bilirubin: 2.2 mg/dL — ABNORMAL HIGH (ref 0.3–1.2)
Total Protein: 7.2 g/dL (ref 6.5–8.1)

## 2022-02-16 LAB — PROTIME-INR
INR: 1.3 — ABNORMAL HIGH (ref 0.8–1.2)
Prothrombin Time: 16.1 seconds — ABNORMAL HIGH (ref 11.4–15.2)

## 2022-02-16 LAB — CBC WITH DIFFERENTIAL/PLATELET
Abs Immature Granulocytes: 0.16 10*3/uL — ABNORMAL HIGH (ref 0.00–0.07)
Basophils Absolute: 0 10*3/uL (ref 0.0–0.1)
Basophils Relative: 0 %
Eosinophils Absolute: 0 10*3/uL (ref 0.0–0.5)
Eosinophils Relative: 0 %
HCT: 30.8 % — ABNORMAL LOW (ref 36.0–46.0)
Hemoglobin: 10 g/dL — ABNORMAL LOW (ref 12.0–15.0)
Immature Granulocytes: 2 %
Lymphocytes Relative: 5 %
Lymphs Abs: 0.4 10*3/uL — ABNORMAL LOW (ref 0.7–4.0)
MCH: 31 pg (ref 26.0–34.0)
MCHC: 32.5 g/dL (ref 30.0–36.0)
MCV: 95.4 fL (ref 80.0–100.0)
Monocytes Absolute: 0.6 10*3/uL (ref 0.1–1.0)
Monocytes Relative: 7 %
Neutro Abs: 7.1 10*3/uL (ref 1.7–7.7)
Neutrophils Relative %: 86 %
Platelets: 220 10*3/uL (ref 150–400)
RBC: 3.23 MIL/uL — ABNORMAL LOW (ref 3.87–5.11)
RDW: 26.8 % — ABNORMAL HIGH (ref 11.5–15.5)
WBC: 8.4 10*3/uL (ref 4.0–10.5)
nRBC: 0.4 % — ABNORMAL HIGH (ref 0.0–0.2)

## 2022-02-16 LAB — APTT: aPTT: 27 seconds (ref 24–36)

## 2022-02-16 LAB — LACTIC ACID, PLASMA: Lactic Acid, Venous: 3.2 mmol/L (ref 0.5–1.9)

## 2022-02-16 MED ORDER — SODIUM CHLORIDE 0.9 % IV BOLUS
1000.0000 mL | Freq: Once | INTRAVENOUS | Status: AC
  Administered 2022-02-16: 1000 mL via INTRAVENOUS

## 2022-02-16 MED ORDER — SODIUM CHLORIDE 0.9 % IV BOLUS (SEPSIS)
30.0000 mL/kg | Freq: Once | INTRAVENOUS | Status: AC
  Administered 2022-02-16: 2490 mL via INTRAVENOUS

## 2022-02-16 MED ORDER — SODIUM CHLORIDE 0.9 % IV SOLN: Freq: Once | INTRAVENOUS | Status: DC

## 2022-02-16 NOTE — ED Provider Notes (Incomplete)
[facility] EMERGENCY DEPARTMENT Provider Note   CSN: [id number] Arrival date & time: 02/16/22  2147     History {Add pertinent medical, surgical, social history, OB history to HPI:1} Chief Complaint  Patient presents with  . Loss of Consciousness    Nicole Santos is a 51 y.o. female.   Loss of Consciousness    Patient has history of hypertension diabetes ovarian cancer, peritoneal carcinomatosis who presents to the ED with complaints of a syncopal episode.  Patient is scheduled for exploratory laparotomy with tumor debulking and possible bowel obstruction obstruction as well as possible hysterectomy later this month.  Patient had been getting chemotherapy with her last treatment August 24.  Patient has been feeling very weak overall but today when she tried to get up and go to the bathroom the next thing she remembers was waking up on the floor with her mid family checking on her.  Daughter states that she checked on her mother and she mentioned she needed to go to the bathroom.  Patient felt like she could go on her own.  Next thing the daughter heard was thump and when she went to find her she was slumped up against the wall.  Patient states she lost consciousness.  Daughter states it could have been more than a few seconds as she was able to check on her quickly.  Patient denies any headache.  She is not having any chest pain.  No abdominal pain.  She has not noticed any blood in her stool she denies any fevers.  Home Medications Prior to Admission medications   Medication Sig Start Date End Date Taking? Authorizing Provider  aspirin EC 81 MG tablet Take 81 mg by mouth daily.    [provider]  atorvastatin (LIPITOR) 20 MG tablet Take 1 tablet (20 mg total) by mouth daily. Patient taking differently: Take 20 mg by mouth at bedtime. 02/08/22   Loman Brooklyn, FNP  Beneprotein PACK Take 1 Scoop (6 g total) by mouth 3 (three) times daily. Patient not taking: Reported on  02/13/2022 02/08/22   Loman Brooklyn, FNP  bisacodyl (DULCOLAX) 5 MG EC tablet Take four tablets at 7 am the day BEFORE surgery 02/19/22   Joylene John D, NP  magnesium oxide (MAG-OX) 400 (240 Mg) MG tablet Take 1 tablet (400 mg total) by mouth 2 (two) times daily. Patient taking differently: Take 400 mg by mouth at bedtime. 01/02/22   Derek Jack, MD  metoprolol succinate (TOPROL-XL) 25 MG 24 hr tablet Take 0.5 tablets (12.5 mg total) by mouth daily. Patient taking differently: Take 12.5 mg by mouth at bedtime. 02/08/22   Loman Brooklyn, FNP  metroNIDAZOLE (FLAGYL) 500 MG tablet Take 2 tablets (1000 mg total) at 2 pm, 3 pm, and 10 pm the day BEFORE surgery 02/19/22   Joylene John D, NP  polyethylene glycol powder (MIRALAX) 17 GM/SCOOP powder Mix entire bottle with 64 ounces of gatorade (G2 or powerade zero). Begin drinking at 10 am the day BEFORE surgery and finish in 4 hours. 02/19/22   Joylene John D, NP  Potassium 99 MG TABS Take 198 mg by mouth at bedtime.    [provider]  traMADol (ULTRAM) 50 MG tablet Take 1 tablet (50 mg total) by mouth every 6 (six) hours as needed for severe pain. For AFTER surgery, do not take and drive 09/11/79   Joylene John D, NP      Allergies    Augmentin [amoxicillin-pot clavulanate], Doxycycline,  and Topamax [topiramate]    Review of Systems   Review of Systems  Cardiovascular:  Positive for syncope.    Physical Exam Updated Vital Signs BP (!) 85/54   Pulse (!) 110   Temp 97.9 F (36.6 C) (Oral)   Resp (!) 22   Ht 1.575 m ('5\' 2"'$ )   Wt 83 kg   SpO2 100%   BMI 33.47 kg/m  Physical Exam Vitals and nursing note reviewed.  Constitutional:      Appearance: She is well-developed. She is ill-appearing.  HENT:     Head: Normocephalic and atraumatic.     Right Ear: External ear normal.     Left Ear: External ear normal.  Eyes:     General: No scleral icterus.       Right eye: No discharge.        Left eye: No discharge.      Conjunctiva/sclera: Conjunctivae normal.  Neck:     Trachea: No tracheal deviation.  Cardiovascular:     Rate and Rhythm: Normal rate and regular rhythm.  Pulmonary:     Effort: Pulmonary effort is normal. No respiratory distress.     Breath sounds: Normal breath sounds. No stridor. No wheezing or rales.  Abdominal:     General: Bowel sounds are normal. There is no distension.     Palpations: Abdomen is soft.     Tenderness: There is no abdominal tenderness. There is no guarding or rebound.  Musculoskeletal:        General: No tenderness or deformity.     Cervical back: Neck supple.  Skin:    General: Skin is warm and dry.     Coloration: Skin is pale.     Findings: No rash.  Neurological:     General: No focal deficit present.     Mental Status: She is alert.     Cranial Nerves: No cranial nerve deficit (no facial droop, extraocular movements intact, no slurred speech).     Sensory: No sensory deficit.     Motor: Weakness present. No abnormal muscle tone or seizure activity.     Coordination: Coordination normal.     Comments: Generalized weakness with patient is able to move all extremities  Psychiatric:        Mood and Affect: Mood normal.     ED Results / Procedures / Treatments   Labs (all labs ordered are listed, but only abnormal results are displayed) Labs Reviewed  CULTURE, BLOOD (ROUTINE X 2)  CULTURE, BLOOD (ROUTINE X 2)  LACTIC ACID, PLASMA  LACTIC ACID, PLASMA  COMPREHENSIVE METABOLIC PANEL  CBC WITH DIFFERENTIAL/PLATELET  PROTIME-INR  APTT  URINALYSIS, ROUTINE W REFLEX MICROSCOPIC  TYPE AND SCREEN    EKG EKG Interpretation  Date/Time:  Friday February 16 2022 22:18:47 EDT Ventricular Rate:  122 PR Interval:  144 QRS Duration: 86 QT Interval:  385 QTC Calculation: 544 R Axis:   162 Text Interpretation: Sinus tachycardia Paired ventricular premature complexes Low voltage, precordial leads Probable RVH w/ secondary repol abnormality Nonspecific T  abnormalities, lateral leads Prolonged QT interval , decreased since previous Confirmed by Dorie Rank [phone]) on 02/16/2022 10:52:00 PM  Radiology No results found.  Procedures Procedures  {Document cardiac monitor, telemetry assessment procedure when appropriate:1}  Medications Ordered in ED Medications  sodium chloride 0.9 % bolus 2,490 mL (has no administration in time range)  sodium chloride 0.9 % bolus 1,000 mL (1,000 mLs Intravenous New Bag/Given 02/16/22 2237)    ED Course/ Medical  Decision Making/ A&P Clinical Course as of 02/16/22 2349  Fri Feb 16, 2022  2348 Blood pressure improving with IV hydration [JK]  2348 Comprehensive metabolic panel(!) Bicarb decreased at 19 [JK]  2349 CBC with Differential(!) Hemoglobin stable.  No leukocytosis [JK]  2349 Lactic acid, plasma(!!) Lactic acid level elevated [JK]  2349 DG Chest Port 1 View  x-ray without signs of pneumonia [JK]    Clinical Course User Index [JK] Dorie Rank, MD                           Medical Decision Making Amount and/or Complexity of Data Reviewed Labs: ordered. Decision-making details documented in ED Course. Radiology: ordered. Decision-making details documented in ED Course.   ***  {Document critical care time when appropriate:1} {Document review of labs and clinical decision tools ie heart score, Chads2Vasc2 etc:1}  {Document your independent review of radiology images, and any outside records:1} {Document your discussion with family members, caretakers, and with consultants:1} {Document social determinants of health affecting pt's care:1} {Document your decision making why or why not admission, treatments were needed:1} Final Clinical Impression(s) / ED Diagnoses Final diagnoses:  None    Rx / DC Orders ED Discharge Orders     None

## 2022-02-16 NOTE — ED Triage Notes (Addendum)
Pt from home, reports ambulating to bathroom and had syncopal episode, when pt woke up she was on bedroom floor, pt daughter witnessed fall says pt was walking and "went down on knees". Pt has small cut to lip from teeth. Pt denies pain or injury. Pt currently on chemo for peritoneal CA.

## 2022-02-16 NOTE — ED Provider Notes (Signed)
[facility] EMERGENCY DEPARTMENT Provider Note   CSN: [id number] Arrival date & time: 02/16/22  2147     History {Add pertinent medical, surgical, social history, OB history to HPI:1} Chief Complaint  Patient presents with  . Loss of Consciousness    Nicole Santos is a 51 y.o. female.   Loss of Consciousness    Patient has history of hypertension diabetes ovarian cancer, peritoneal carcinomatosis who presents to the ED with complaints of a syncopal episode.  Patient is scheduled for exploratory laparotomy with tumor debulking and possible bowel obstruction obstruction as well as possible hysterectomy later this month.  Patient had been getting chemotherapy with her last treatment August 24.  Patient has been feeling very weak overall but today when she tried to get up and go to the bathroom the next thing she remembers was waking up on the floor with her mid family checking on her.  Daughter states that she checked on her mother and she mentioned she needed to go to the bathroom.  Patient felt like she could go on her own.  Next thing the daughter heard was thump and when she went to find her she was slumped up against the wall.  Patient states she lost consciousness.  Daughter states it could have been more than a few seconds as she was able to check on her quickly.  Patient denies any headache.  She is not having any chest pain.  No abdominal pain.  She has not noticed any blood in her stool she denies any fevers.  Home Medications Prior to Admission medications   Medication Sig Start Date End Date Taking? Authorizing Provider  aspirin EC 81 MG tablet Take 81 mg by mouth daily.    [provider]  atorvastatin (LIPITOR) 20 MG tablet Take 1 tablet (20 mg total) by mouth daily. Patient taking differently: Take 20 mg by mouth at bedtime. 02/08/22   Loman Brooklyn, FNP  Beneprotein PACK Take 1 Scoop (6 g total) by mouth 3 (three) times daily. Patient not taking: Reported on  02/13/2022 02/08/22   Loman Brooklyn, FNP  bisacodyl (DULCOLAX) 5 MG EC tablet Take four tablets at 7 am the day BEFORE surgery 02/19/22   Joylene John D, NP  magnesium oxide (MAG-OX) 400 (240 Mg) MG tablet Take 1 tablet (400 mg total) by mouth 2 (two) times daily. Patient taking differently: Take 400 mg by mouth at bedtime. 01/02/22   Derek Jack, MD  metoprolol succinate (TOPROL-XL) 25 MG 24 hr tablet Take 0.5 tablets (12.5 mg total) by mouth daily. Patient taking differently: Take 12.5 mg by mouth at bedtime. 02/08/22   Loman Brooklyn, FNP  metroNIDAZOLE (FLAGYL) 500 MG tablet Take 2 tablets (1000 mg total) at 2 pm, 3 pm, and 10 pm the day BEFORE surgery 02/19/22   Joylene John D, NP  polyethylene glycol powder (MIRALAX) 17 GM/SCOOP powder Mix entire bottle with 64 ounces of gatorade (G2 or powerade zero). Begin drinking at 10 am the day BEFORE surgery and finish in 4 hours. 02/19/22   Joylene John D, NP  Potassium 99 MG TABS Take 198 mg by mouth at bedtime.    [provider]  traMADol (ULTRAM) 50 MG tablet Take 1 tablet (50 mg total) by mouth every 6 (six) hours as needed for severe pain. For AFTER surgery, do not take and drive 2/83/15   Joylene John D, NP      Allergies    Augmentin [amoxicillin-pot clavulanate], Doxycycline,  and Topamax [topiramate]    Review of Systems   Review of Systems  Cardiovascular:  Positive for syncope.    Physical Exam Updated Vital Signs BP (!) 85/54   Pulse (!) 110   Temp 97.9 F (36.6 C) (Oral)   Resp (!) 22   Ht 1.575 m ('5\' 2"'$ )   Wt 83 kg   SpO2 100%   BMI 33.47 kg/m  Physical Exam Vitals and nursing note reviewed.  Constitutional:      Appearance: She is well-developed. She is ill-appearing.  HENT:     Head: Normocephalic and atraumatic.     Right Ear: External ear normal.     Left Ear: External ear normal.  Eyes:     General: No scleral icterus.       Right eye: No discharge.        Left eye: No discharge.      Conjunctiva/sclera: Conjunctivae normal.  Neck:     Trachea: No tracheal deviation.  Cardiovascular:     Rate and Rhythm: Normal rate and regular rhythm.  Pulmonary:     Effort: Pulmonary effort is normal. No respiratory distress.     Breath sounds: Normal breath sounds. No stridor. No wheezing or rales.  Abdominal:     General: Bowel sounds are normal. There is no distension.     Palpations: Abdomen is soft.     Tenderness: There is no abdominal tenderness. There is no guarding or rebound.  Musculoskeletal:        General: No tenderness or deformity.     Cervical back: Neck supple.  Skin:    General: Skin is warm and dry.     Coloration: Skin is pale.     Findings: No rash.  Neurological:     General: No focal deficit present.     Mental Status: She is alert.     Cranial Nerves: No cranial nerve deficit (no facial droop, extraocular movements intact, no slurred speech).     Sensory: No sensory deficit.     Motor: Weakness present. No abnormal muscle tone or seizure activity.     Coordination: Coordination normal.     Comments: Generalized weakness with patient is able to move all extremities  Psychiatric:        Mood and Affect: Mood normal.    ED Results / Procedures / Treatments   Labs (all labs ordered are listed, but only abnormal results are displayed) Labs Reviewed  CULTURE, BLOOD (ROUTINE X 2)  CULTURE, BLOOD (ROUTINE X 2)  LACTIC ACID, PLASMA  LACTIC ACID, PLASMA  COMPREHENSIVE METABOLIC PANEL  CBC WITH DIFFERENTIAL/PLATELET  PROTIME-INR  APTT  URINALYSIS, ROUTINE W REFLEX MICROSCOPIC  TYPE AND SCREEN    EKG EKG Interpretation  Date/Time:  Friday February 16 2022 22:18:47 EDT Ventricular Rate:  122 PR Interval:  144 QRS Duration: 86 QT Interval:  385 QTC Calculation: 544 R Axis:   162 Text Interpretation: Sinus tachycardia Paired ventricular premature complexes Low voltage, precordial leads Probable RVH w/ secondary repol abnormality Nonspecific T  abnormalities, lateral leads Prolonged QT interval , decreased since previous Confirmed by Dorie Rank [phone]) on 02/16/2022 10:52:00 PM  Radiology No results found.  Procedures Procedures  {Document cardiac monitor, telemetry assessment procedure when appropriate:1}  Medications Ordered in ED Medications  sodium chloride 0.9 % bolus 2,490 mL (has no administration in time range)  sodium chloride 0.9 % bolus 1,000 mL (1,000 mLs Intravenous New Bag/Given 02/16/22 2237)    ED Course/ Medical Decision  Making/ A&P                           Medical Decision Making Amount and/or Complexity of Data Reviewed Labs: ordered. Radiology: ordered.   ***  {Document critical care time when appropriate:1} {Document review of labs and clinical decision tools ie heart score, Chads2Vasc2 etc:1}  {Document your independent review of radiology images, and any outside records:1} {Document your discussion with family members, caretakers, and with consultants:1} {Document social determinants of health affecting pt's care:1} {Document your decision making why or why not admission, treatments were needed:1} Final Clinical Impression(s) / ED Diagnoses Final diagnoses:  None    Rx / DC Orders ED Discharge Orders     None

## 2022-02-16 NOTE — Telephone Encounter (Signed)
Spoke with pt in regards to updated instructions on surgery from [place], [employer]. Pt has already picked up Miralax and Dulcolax from pharmacy.  Informed pt not to take those medications. Reminded pt to drink ensure 3 hours prior to surgery.  Pt verbalized understanding.

## 2022-02-17 ENCOUNTER — Observation Stay (HOSPITAL_COMMUNITY): Payer: Commercial Managed Care - PPO

## 2022-02-17 ENCOUNTER — Encounter (HOSPITAL_COMMUNITY): Payer: Self-pay | Admitting: Family Medicine

## 2022-02-17 DIAGNOSIS — C482 Malignant neoplasm of peritoneum, unspecified: Secondary | ICD-10-CM

## 2022-02-17 DIAGNOSIS — I10 Essential (primary) hypertension: Secondary | ICD-10-CM | POA: Diagnosis present

## 2022-02-17 DIAGNOSIS — E872 Acidosis, unspecified: Secondary | ICD-10-CM | POA: Diagnosis present

## 2022-02-17 DIAGNOSIS — E876 Hypokalemia: Secondary | ICD-10-CM | POA: Diagnosis present

## 2022-02-17 DIAGNOSIS — R55 Syncope and collapse: Secondary | ICD-10-CM

## 2022-02-17 DIAGNOSIS — D61818 Other pancytopenia: Secondary | ICD-10-CM | POA: Diagnosis present

## 2022-02-17 DIAGNOSIS — E860 Dehydration: Secondary | ICD-10-CM | POA: Diagnosis present

## 2022-02-17 DIAGNOSIS — E871 Hypo-osmolality and hyponatremia: Secondary | ICD-10-CM | POA: Diagnosis present

## 2022-02-17 DIAGNOSIS — I9589 Other hypotension: Secondary | ICD-10-CM | POA: Diagnosis present

## 2022-02-17 DIAGNOSIS — F419 Anxiety disorder, unspecified: Secondary | ICD-10-CM | POA: Diagnosis present

## 2022-02-17 DIAGNOSIS — R630 Anorexia: Secondary | ICD-10-CM | POA: Diagnosis present

## 2022-02-17 DIAGNOSIS — D630 Anemia in neoplastic disease: Secondary | ICD-10-CM | POA: Diagnosis present

## 2022-02-17 DIAGNOSIS — Z28310 Unvaccinated for covid-19: Secondary | ICD-10-CM | POA: Diagnosis not present

## 2022-02-17 DIAGNOSIS — E861 Hypovolemia: Secondary | ICD-10-CM | POA: Diagnosis present

## 2022-02-17 DIAGNOSIS — Z8616 Personal history of COVID-19: Secondary | ICD-10-CM | POA: Diagnosis not present

## 2022-02-17 DIAGNOSIS — Z881 Allergy status to other antibiotic agents status: Secondary | ICD-10-CM | POA: Diagnosis not present

## 2022-02-17 DIAGNOSIS — E669 Obesity, unspecified: Secondary | ICD-10-CM | POA: Diagnosis present

## 2022-02-17 DIAGNOSIS — Z9221 Personal history of antineoplastic chemotherapy: Secondary | ICD-10-CM | POA: Diagnosis not present

## 2022-02-17 DIAGNOSIS — Z8543 Personal history of malignant neoplasm of ovary: Secondary | ICD-10-CM | POA: Diagnosis not present

## 2022-02-17 DIAGNOSIS — E785 Hyperlipidemia, unspecified: Secondary | ICD-10-CM

## 2022-02-17 DIAGNOSIS — Z86718 Personal history of other venous thrombosis and embolism: Secondary | ICD-10-CM | POA: Diagnosis not present

## 2022-02-17 DIAGNOSIS — F32A Depression, unspecified: Secondary | ICD-10-CM | POA: Diagnosis present

## 2022-02-17 DIAGNOSIS — D6481 Anemia due to antineoplastic chemotherapy: Secondary | ICD-10-CM | POA: Diagnosis present

## 2022-02-17 DIAGNOSIS — Z8249 Family history of ischemic heart disease and other diseases of the circulatory system: Secondary | ICD-10-CM | POA: Diagnosis not present

## 2022-02-17 LAB — URINALYSIS, ROUTINE W REFLEX MICROSCOPIC
Bilirubin Urine: NEGATIVE
Glucose, UA: NEGATIVE mg/dL
Ketones, ur: 80 mg/dL — AB
Leukocytes,Ua: NEGATIVE
Nitrite: POSITIVE — AB
Protein, ur: 30 mg/dL — AB
Specific Gravity, Urine: 1.013 (ref 1.005–1.030)
pH: 6 (ref 5.0–8.0)

## 2022-02-17 LAB — COMPREHENSIVE METABOLIC PANEL
ALT: 22 U/L (ref 0–44)
AST: 27 U/L (ref 15–41)
Albumin: 2.4 g/dL — ABNORMAL LOW (ref 3.5–5.0)
Alkaline Phosphatase: 42 U/L (ref 38–126)
Anion gap: 10 (ref 5–15)
BUN: 7 mg/dL (ref 6–20)
CO2: 24 mmol/L (ref 22–32)
Calcium: 7.3 mg/dL — ABNORMAL LOW (ref 8.9–10.3)
Chloride: 101 mmol/L (ref 98–111)
Creatinine, Ser: 0.51 mg/dL (ref 0.44–1.00)
GFR, Estimated: >60 mL/min (ref >60)
Glucose, Bld: 99 mg/dL (ref 70–99)
Potassium: 2.6 mmol/L — CL (ref 3.5–5.1)
Sodium: 135 mmol/L (ref 135–145)
Total Bilirubin: 1.2 mg/dL (ref 0.3–1.2)
Total Protein: 5.2 g/dL — ABNORMAL LOW (ref 6.5–8.1)

## 2022-02-17 LAB — GLUCOSE, CAPILLARY
Glucose-Capillary: 113 mg/dL — ABNORMAL HIGH (ref 70–99)
Glucose-Capillary: 71 mg/dL (ref 70–99)
Glucose-Capillary: 84 mg/dL (ref 70–99)
Glucose-Capillary: 96 mg/dL (ref 70–99)

## 2022-02-17 LAB — HIV ANTIBODY (ROUTINE TESTING W REFLEX): HIV Screen 4th Generation wRfx: NONREACTIVE

## 2022-02-17 LAB — ECHOCARDIOGRAM COMPLETE
AR max vel: 2.05 cm2
AV Area VTI: 2.16 cm2
AV Area mean vel: 2.24 cm2
AV Mean grad: 5 mmHg
AV Peak grad: 12.8 mmHg
Ao pk vel: 1.79 m/s
Area-P 1/2: 4.57 cm2
Height: 62 in
MV VTI: 2.23 cm2
S' Lateral: 2.7 cm
Weight: 3030 oz

## 2022-02-17 LAB — TYPE AND SCREEN
ABO/RH(D): B NEG
Antibody Screen: NEGATIVE

## 2022-02-17 LAB — TSH: TSH: 0.208 u[IU]/mL — ABNORMAL LOW (ref 0.350–4.500)

## 2022-02-17 LAB — MAGNESIUM: Magnesium: 1.5 mg/dL — ABNORMAL LOW (ref 1.7–2.4)

## 2022-02-17 LAB — PROCALCITONIN: Procalcitonin: 0.21 ng/mL

## 2022-02-17 LAB — LACTIC ACID, PLASMA: Lactic Acid, Venous: 1.2 mmol/L (ref 0.5–1.9)

## 2022-02-17 MED ORDER — POTASSIUM CHLORIDE 10 MEQ/100ML IV SOLN
10.0000 meq | INTRAVENOUS | Status: AC
  Administered 2022-02-17 (×2): 10 meq via INTRAVENOUS
  Filled 2022-02-17: qty 100

## 2022-02-17 MED ORDER — MIDODRINE HCL 5 MG PO TABS
10.0000 mg | ORAL_TABLET | Freq: Three times a day (TID) | ORAL | Status: DC
  Administered 2022-02-17 – 2022-02-21 (×14): 10 mg via ORAL
  Filled 2022-02-17 (×14): qty 2

## 2022-02-17 MED ORDER — ONDANSETRON HCL 4 MG/2ML IJ SOLN
4.0000 mg | Freq: Four times a day (QID) | INTRAMUSCULAR | Status: DC | PRN
  Administered 2022-02-17: 4 mg via INTRAVENOUS
  Filled 2022-02-17: qty 2

## 2022-02-17 MED ORDER — ACETAMINOPHEN 650 MG RE SUPP: 650.0000 mg | Freq: Four times a day (QID) | RECTAL | Status: DC | PRN

## 2022-02-17 MED ORDER — HEPARIN SODIUM (PORCINE) 5000 UNIT/ML IJ SOLN
5000.0000 [IU] | Freq: Three times a day (TID) | INTRAMUSCULAR | Status: DC
  Administered 2022-02-17 – 2022-02-21 (×13): 5000 [IU] via SUBCUTANEOUS
  Filled 2022-02-17 (×13): qty 1

## 2022-02-17 MED ORDER — SODIUM CHLORIDE 0.9 % IV SOLN
2.0000 g | Freq: Once | INTRAVENOUS | Status: AC
  Administered 2022-02-17: 2 g via INTRAVENOUS
  Filled 2022-02-17: qty 20

## 2022-02-17 MED ORDER — OXYCODONE HCL 5 MG PO TABS: 5.0000 mg | ORAL_TABLET | ORAL | Status: DC | PRN

## 2022-02-17 MED ORDER — CALCIUM GLUCONATE-NACL 1-0.675 GM/50ML-% IV SOLN
1.0000 g | Freq: Once | INTRAVENOUS | Status: AC
  Administered 2022-02-17: 1000 mg via INTRAVENOUS
  Filled 2022-02-17: qty 50

## 2022-02-17 MED ORDER — METOPROLOL SUCCINATE ER 25 MG PO TB24
12.5000 mg | ORAL_TABLET | Freq: Every day | ORAL | Status: DC
  Administered 2022-02-17 – 2022-02-20 (×3): 12.5 mg via ORAL
  Filled 2022-02-17 (×3): qty 1

## 2022-02-17 MED ORDER — TRAMADOL HCL 50 MG PO TABS: 50.0000 mg | ORAL_TABLET | Freq: Four times a day (QID) | ORAL | Status: DC | PRN

## 2022-02-17 MED ORDER — CHLORHEXIDINE GLUCONATE CLOTH 2 % EX PADS
6.0000 | MEDICATED_PAD | Freq: Every day | CUTANEOUS | Status: DC
  Administered 2022-02-17 – 2022-02-21 (×5): 6 via TOPICAL

## 2022-02-17 MED ORDER — OXYCODONE HCL 5 MG PO TABS: 5.0000 mg | ORAL_TABLET | Freq: Four times a day (QID) | ORAL | Status: DC | PRN

## 2022-02-17 MED ORDER — KCL IN DEXTROSE-NACL 40-5-0.9 MEQ/L-%-% IV SOLN: INTRAVENOUS | Status: DC

## 2022-02-17 MED ORDER — ACETAMINOPHEN 325 MG PO TABS
650.0000 mg | ORAL_TABLET | Freq: Four times a day (QID) | ORAL | Status: DC | PRN
  Administered 2022-02-17: 650 mg via ORAL
  Filled 2022-02-17: qty 2

## 2022-02-17 MED ORDER — ONDANSETRON HCL 4 MG PO TABS: 4.0000 mg | ORAL_TABLET | Freq: Four times a day (QID) | ORAL | Status: DC | PRN

## 2022-02-17 MED ORDER — SODIUM CHLORIDE 0.9% FLUSH
3.0000 mL | Freq: Two times a day (BID) | INTRAVENOUS | Status: DC
  Administered 2022-02-17 – 2022-02-21 (×6): 3 mL via INTRAVENOUS

## 2022-02-17 MED ORDER — POTASSIUM CHLORIDE CRYS ER 20 MEQ PO TBCR
40.0000 meq | EXTENDED_RELEASE_TABLET | Freq: Once | ORAL | Status: AC
  Administered 2022-02-17: 40 meq via ORAL
  Filled 2022-02-17: qty 2

## 2022-02-17 MED ORDER — MAGNESIUM SULFATE 2 GM/50ML IV SOLN
2.0000 g | Freq: Once | INTRAVENOUS | Status: AC
  Administered 2022-02-17: 2 g via INTRAVENOUS
  Filled 2022-02-17: qty 50

## 2022-02-17 MED ORDER — SODIUM CHLORIDE 0.9 % IV BOLUS
500.0000 mL | Freq: Once | INTRAVENOUS | Status: AC
  Administered 2022-02-17: 500 mL via INTRAVENOUS

## 2022-02-17 MED ORDER — POTASSIUM CHLORIDE 2 MEQ/ML IV SOLN: INTRAVENOUS | Status: DC

## 2022-02-17 MED ORDER — ATORVASTATIN CALCIUM 20 MG PO TABS
20.0000 mg | ORAL_TABLET | Freq: Every day | ORAL | Status: DC
  Administered 2022-02-17 – 2022-02-20 (×4): 20 mg via ORAL
  Filled 2022-02-17 (×4): qty 1

## 2022-02-17 MED ORDER — POTASSIUM CHLORIDE 10 MEQ/100ML IV SOLN
10.0000 meq | INTRAVENOUS | Status: DC
  Administered 2022-02-17 (×2): 10 meq via INTRAVENOUS
  Filled 2022-02-17 (×3): qty 100

## 2022-02-17 MED ORDER — ASPIRIN 81 MG PO TBEC
81.0000 mg | DELAYED_RELEASE_TABLET | Freq: Every day | ORAL | Status: DC
  Administered 2022-02-17 – 2022-02-21 (×5): 81 mg via ORAL
  Filled 2022-02-17 (×5): qty 1

## 2022-02-17 MED ORDER — SODIUM CHLORIDE 0.9 % IV SOLN: INTRAVENOUS | Status: DC

## 2022-02-17 MED ORDER — MORPHINE SULFATE (PF) 2 MG/ML IV SOLN: 2.0000 mg | INTRAVENOUS | Status: DC | PRN

## 2022-02-17 NOTE — Evaluation (Signed)
Physical Therapy Evaluation Patient Details Name: Nicole Santos MRN: [id number] DOB: August 08, 1970 Today's Date: 02/17/2022  History of Present Illness  Nicole Santos is a 51 y.o. female with medical history significant of anxiety, cancer, depression, history of diabetes mellitus type 2, hypertension, and more presents ED with a chief complaint of loss of consciousness.  Patient reports that she got up to go to the bathroom and passed out.  She was only a couple of steps into her journey to the bathroom when she woke up on the floor.  She reports she had no preceding symptoms.  She did hit her head because she "busted her lip."  She reports she was immediately oriented on waking.  She had no change in vision, no asymmetric weakness, no difficulty speaking or swallowing.  Patient denies any chest pain, shortness of breath, headache, dizziness.  Patient reports that she has had poor p.o. intake because her Neulasta makes everything tastes bad.  As soon as the effect of that starts to wear off its time for her to get a new shot of patient reports she has had decreased urine output and darker urine at home.  She has had nausea and vomiting x1.  Patient has no other complaints at this time.   Clinical Impression  Patient demonstrates slow labored movement for sitting up at bedside with Min assist, once seated patient c/o chills and had trembling of extremities that is new to patient.  Patient tolerated standing for orthostatics, but HR increased above 140 BPM and only tolerated taking steps to transfer to chair.  Patient tolerated sitting up in chair and RN/LPN notified and brought to room to check on patient.  Patient's orthostatics as follows: lying 118/58, pulse at 90, sitting 144/124 pulse at 118, standing 144/124 pulse at 155, blood pressure taken at patient's left forearm.  Patient will benefit from continued skilled physical therapy in hospital and recommended venue below to increase strength, balance, endurance  for safe ADLs and gait.       Recommendations for follow up therapy are one component of a multi-disciplinary discharge planning process, led by the attending physician.  Recommendations may be updated based on patient status, additional functional criteria and insurance authorization.  Follow Up Recommendations Skilled nursing-short term rehab (<3 hours/day) Can patient physically be transported by private vehicle: Yes    Assistance Recommended at Discharge Set up Supervision/Assistance  Patient can return home with the following  A lot of help with walking and/or transfers;A little help with bathing/dressing/bathroom;Help with stairs or ramp for entrance;Assistance with cooking/housework    Equipment Recommendations None recommended by PT  Recommendations for Other Services       Functional Status Assessment Patient has had a recent decline in their functional status and demonstrates the ability to make significant improvements in function in a reasonable and predictable amount of time.     Precautions / Restrictions Precautions Precautions: Fall Restrictions Weight Bearing Restrictions: No      Mobility  Bed Mobility Overal bed mobility: Needs Assistance Bed Mobility: Supine to Sit     Supine to sit: Min assist     General bed mobility comments: increased time, labored movement    Transfers Overall transfer level: Needs assistance Equipment used: 1 person hand held assist, None Transfers: Sit to/from Stand, Bed to chair/wheelchair/BSC Sit to Stand: Min guard, Min assist   Step pivot transfers: Min assist       General transfer comment: slow labored shaky movement    Ambulation/Gait  Ambulation/Gait assistance: Min assist, Mod assist Gait Distance (Feet): 4 Feet Assistive device: 1 person hand held assist, None Gait Pattern/deviations: Decreased step length - right, Decreased step length - left, Decreased stride length Gait velocity: slow     General Gait  Details: limited to a few steps at bedside due to c/o chills, trembling and increasing HR above 140 BPM  Stairs            Wheelchair Mobility    Modified Rankin (Stroke Patients Only)       Balance Overall balance assessment: Needs assistance Sitting-balance support: Feet supported, No upper extremity supported Sitting balance-Leahy Scale: Good Sitting balance - Comments: seated at EOB   Standing balance support: During functional activity, No upper extremity supported Standing balance-Leahy Scale: Poor Standing balance comment: standing without AD                             Pertinent Vitals/Pain Pain Assessment Pain Assessment: No/denies pain    Home Living Family/patient expects to be discharged to:: Private residence Living Arrangements: Spouse/significant other Available Help at Discharge: Family;Available PRN/intermittently Type of Home: House Home Access: Stairs to enter Entrance Stairs-Rails: None Entrance Stairs-Number of Steps: 1   Home Layout: One level Home Equipment: Rollator (4 wheels);BSC/3in1;Shower seat      Prior Function Prior Level of Function : Independent/Modified Independent             Mobility Comments: household ambulator without AD ADLs Comments: Independent with household ADLs, family assist with community ADLs     Hand Dominance        Extremity/Trunk Assessment   Upper Extremity Assessment Upper Extremity Assessment: Generalized weakness    Lower Extremity Assessment Lower Extremity Assessment: Generalized weakness    Cervical / Trunk Assessment Cervical / Trunk Assessment: Normal  Communication   Communication: No difficulties  Cognition Arousal/Alertness: Awake/alert Behavior During Therapy: WFL for tasks assessed/performed Overall Cognitive Status: Within Functional Limits for tasks assessed                                          General Comments      Exercises      Assessment/Plan    PT Assessment Patient needs continued PT services  PT Problem List Decreased strength;Decreased activity tolerance;Decreased balance;Decreased mobility       PT Treatment Interventions DME instruction;Gait training;Stair training;Functional mobility training;Therapeutic activities;Therapeutic exercise;Balance training    PT Goals (Current goals can be found in the Care Plan section)  Acute Rehab PT Goals Patient Stated Goal: return home PT Goal Formulation: With patient/family Time For Goal Achievement: 03/03/22 Potential to Achieve Goals: Good    Frequency Min 3X/week     Co-evaluation               AM-PAC PT "6 Clicks" Mobility  Outcome Measure Help needed turning from your back to your side while in a flat bed without using bedrails?: A Little Help needed moving from lying on your back to sitting on the side of a flat bed without using bedrails?: A Little Help needed moving to and from a bed to a chair (including a wheelchair)?: A Lot Help needed standing up from a chair using your arms (e.g., wheelchair or bedside chair)?: A Lot Help needed to walk in hospital room?: A Lot Help needed climbing 3-5 steps with  a railing? : A Lot 6 Click Score: 14    End of Session   Activity Tolerance: Patient tolerated treatment well;Patient limited by fatigue Patient left: in chair;with call bell/phone within reach;with family/visitor present;with nursing/sitter in room Nurse Communication: Mobility status PT Visit Diagnosis: Unsteadiness on feet (R26.81);Other abnormalities of gait and mobility (R26.89);Muscle weakness (generalized) (M62.81)    Time: 3612-2449 PT Time Calculation (min) (ACUTE ONLY): 25 min   Charges:   PT Evaluation $PT Eval Moderate Complexity: 1 Mod PT Treatments $Therapeutic Activity: 23-37 mins        1:30 PM, 02/17/22 Lonell Grandchild, MPT Physical Therapist with [facility] [phone] office [phone] mobile  phone

## 2022-02-17 NOTE — Progress Notes (Signed)
*  PRELIMINARY RESULTS* Echocardiogram 2D Echocardiogram has been performed.  Elpidio Anis 02/17/2022, 10:41 AM

## 2022-02-17 NOTE — Assessment & Plan Note (Signed)
-  Currently undergoing treatment - Treatment for this is the reason patient has no appetite and poor p.o. intake - Continue to encourage oral intake, feeding supplement -Patient very frail and deconditioned.  She might benefit of TPN. -Oncology service consulted.

## 2022-02-17 NOTE — Progress Notes (Addendum)
Patient seen and examined; admitted after midnight secondary to weakness and syncope.  Found to be dehydrated, with hypomagnesemia and hypokalemia.  Patient with underlying history of abdominal carcinomatosis for which she is actively receiving chemo and decreased appetite/oral intake.  Patient reports nausea and vomiting x1 prior to admission; there was no signs of hematemesis.  No fever, no chest pain, shortness of breath, fever, chills, productive cough, sick contacts or any other complaints.  Please refer to H&P written by Dr.Zierle-Ghosh for further info/details about admission.  Plan: -Continue to maintain adequate hydration -Replete electrolytes and follow trend -Check orthostatic vital signs in AM -Follow clinical response -No signs of infections currently; continue holding the use of antibiotics.  Barton Dubois MD [phone]                                                                                                                                                                     .Marland KitchenMarland KitchenMarland KitchenMarland KitchenMarland Kitchen

## 2022-02-17 NOTE — Assessment & Plan Note (Signed)
-   Potassium repleted and within normal limit current -Continue to follow electrolytes trend.

## 2022-02-17 NOTE — Assessment & Plan Note (Signed)
-   Continue to follow ultralights and further replete as needed.

## 2022-02-17 NOTE — Assessment & Plan Note (Signed)
-  Thought to be related to hypotension and orthostatics - Patient's blood pressure as low as 80/50 - overall better after fluid resuscitation -Continue the use of midodrine -Follow echo results and fluid resuscitation. -Minimize agents/medications to control blood pressure -No acute sources of infection appreciated.

## 2022-02-17 NOTE — Assessment & Plan Note (Signed)
Lactic acid initially 3.2, after fluid boluses improved to 1.2 Procalcitonin is not indicative of bacterial infection at 0.21 Thought to be related to dehydration Continue IV fluids

## 2022-02-17 NOTE — Plan of Care (Signed)
  Problem: Acute Rehab PT Goals(only PT should resolve) Goal: Pt Will Go Supine/Side To Sit Outcome: Progressing Flowsheets (Taken 02/17/2022 1331) Pt will go Supine/Side to Sit:  with min guard assist  with minimal assist Goal: Patient Will Transfer Sit To/From Stand Outcome: Progressing Flowsheets (Taken 02/17/2022 1331) Patient will transfer sit to/from stand:  with supervision  with min guard assist Goal: Pt Will Transfer Bed To Chair/Chair To Bed Outcome: Progressing Flowsheets (Taken 02/17/2022 1331) Pt will Transfer Bed to Chair/Chair to Bed:  min guard assist  with supervision Goal: Pt Will Ambulate Outcome: Progressing Flowsheets (Taken 02/17/2022 1331) Pt will Ambulate:  50 feet  with least restrictive assistive device  with min guard assist   1:33 PM, 02/17/22 Lonell Grandchild, MPT Physical Therapist with [facility] [phone] office [phone] mobile phone

## 2022-02-17 NOTE — H&P (Signed)
History and Physical    Patient: Nicole Santos [id number]:[id number] DOB: 23-Jun-1970 DOA: 02/16/2022 DOS: the patient was seen and examined on 02/17/2022 PCP: Loman Brooklyn, FNP  Patient coming from: Home  Chief Complaint:  Chief Complaint  Patient presents with   Loss of Consciousness   HPI: Nicole Santos is a 51 y.o. female with medical history significant of anxiety, cancer, depression, history of diabetes mellitus type 2, hypertension, and more presents ED with a chief complaint of loss of consciousness.  Patient reports that she got up to go to the bathroom and passed out.  She was only a couple of steps into her journey to the bathroom when she woke up on the floor.  She reports she had no preceding symptoms.  She did hit her head because she "busted her lip."  She reports she was immediately oriented on waking.  She had no change in vision, no asymmetric weakness, no difficulty speaking or swallowing.  Patient denies any chest pain, shortness of breath, headache, dizziness.  Patient reports that she has had poor p.o. intake because her Neulasta makes everything tastes bad.  As soon as the effect of that starts to wear off its time for her to get a new shot of patient reports she has had decreased urine output and darker urine at home.  She has had nausea and vomiting x1.  Patient has no other complaints at this time.  Patient does not smoke, does not drink, does not use illicit drugs.  She is not vaccinated for COVID.  Patient is full code. Review of Systems: As mentioned in the history of present illness. All other systems reviewed and are negative. Past Medical History:  Diagnosis Date   Acute respiratory disease due to COVID-19 virus 02/02/2019   Anxiety    Cancer ([place])    Depression    Dyspnea    Dysrhythmia    History of diabetes mellitus    Resolved in Sept 2023 with weight loss.   Hypertension    Ovarian cancer ([place]) 11/14/2021   Peritoneal carcinomatosis ([place])    Pneumonia     Sleep apnea    doesn't use CPAP   Tachycardia    Past Surgical History:  Procedure Laterality Date   KNEE SURGERY Right    Arthroscopic   LAPAROSCOPIC ABDOMINAL EXPLORATION N/A 11/13/2021   Procedure: Exploratory laparoscopy;  Surgeon: Aviva Signs, MD;  Location: [facility];  Service: General;  Laterality: N/A;   PORTACATH PLACEMENT Left 11/15/2021   Procedure: INSERTION PORT-A-CATH;  Surgeon: Aviva Signs, MD;  Location: [facility];  Service: General;  Laterality: Left;   TONSILLECTOMY AND ADENOIDECTOMY     Social History:  reports that she has never smoked. She has never used smokeless tobacco. She reports that she does not drink alcohol and does not use drugs.  Allergies  Allergen Reactions   Augmentin [Amoxicillin-Pot Clavulanate] Diarrhea and Nausea And Vomiting   Doxycycline     Abdominal cramps.   Topamax [Topiramate] Nausea And Vomiting    Family History  Problem Relation Age of Onset   Hypertension Mother    Bladder Cancer Maternal Grandmother    Breast cancer Maternal Great-grandmother    Colon cancer Neg Hx    Ovarian cancer Neg Hx    Endometrial cancer Neg Hx    Pancreatic cancer Neg Hx    Prostate cancer Neg Hx     Prior to Admission medications   Medication Sig Start Date End Date Taking? Authorizing Provider  aspirin EC 81 MG tablet Take 81 mg by mouth daily.    [provider]  atorvastatin (LIPITOR) 20 MG tablet Take 1 tablet (20 mg total) by mouth daily. Patient taking differently: Take 20 mg by mouth at bedtime. 02/08/22   Loman Brooklyn, FNP  Beneprotein PACK Take 1 Scoop (6 g total) by mouth 3 (three) times daily. Patient not taking: Reported on 02/13/2022 02/08/22   Loman Brooklyn, FNP  bisacodyl (DULCOLAX) 5 MG EC tablet Take four tablets at 7 am the day BEFORE surgery 02/19/22   Joylene John D, NP  magnesium oxide (MAG-OX) 400 (240 Mg) MG tablet Take 1 tablet (400 mg total) by mouth 2 (two) times daily. Patient taking differently: Take 400 mg by  mouth at bedtime. 01/02/22   Derek Jack, MD  metoprolol succinate (TOPROL-XL) 25 MG 24 hr tablet Take 0.5 tablets (12.5 mg total) by mouth daily. Patient taking differently: Take 12.5 mg by mouth at bedtime. 02/08/22   Loman Brooklyn, FNP  metroNIDAZOLE (FLAGYL) 500 MG tablet Take 2 tablets (1000 mg total) at 2 pm, 3 pm, and 10 pm the day BEFORE surgery 02/19/22   Joylene John D, NP  polyethylene glycol powder (MIRALAX) 17 GM/SCOOP powder Mix entire bottle with 64 ounces of gatorade (G2 or powerade zero). Begin drinking at 10 am the day BEFORE surgery and finish in 4 hours. 02/19/22   Joylene John D, NP  Potassium 99 MG TABS Take 198 mg by mouth at bedtime.    [provider]  traMADol (ULTRAM) 50 MG tablet Take 1 tablet (50 mg total) by mouth every 6 (six) hours as needed for severe pain. For AFTER surgery, do not take and drive 7/41/28   Joylene John D, NP    Physical Exam: Vitals:   02/17/22 0100 02/17/22 0139 02/17/22 0440 02/17/22 0537  BP: 100/64 (!) 95/54 (!) 88/58   Pulse: (!) 105 95 87   Resp: (!) '23 20 19   '$ Temp:  98.9 F (37.2 C) 98.6 F (37 C)   TempSrc:  Oral Oral   SpO2: 95% 100% 94%   Weight:    85.9 kg  Height:       1.  General: Patient lying supine in bed,  no acute distress   2. Psychiatric: Alert and oriented x 3, mood and behavior normal for situation, pleasant and cooperative with exam   3. Neurologic: Speech and language are normal, face is symmetric, moves all 4 extremities voluntarily, at baseline without acute deficits on limited exam   4. HEENMT:  Head is atraumatic, normocephalic, pupils reactive to light, neck is supple, trachea is midline, mucous membranes are moist   5. Respiratory : Lungs are clear to auscultation bilaterally without wheezing, rhonchi, rales, no cyanosis, no increase in work of breathing or accessory muscle use   6. Cardiovascular : Heart rate tachycardic, rhythm is regular, no murmurs, rubs or gallops, no  peripheral edema, peripheral pulses palpated   7. Gastrointestinal:  Abdomen is soft, nondistended, nontender to palpation bowel sounds active, no masses or organomegaly palpated   8. Skin:  Skin is warm, dry and intact without rashes, acute lesions, or ulcers on limited exam   9.Musculoskeletal:  No acute deformities or trauma, no asymmetry in tone, no peripheral edema, peripheral pulses palpated, no tenderness to palpation in the extremities  Data Reviewed: In the ED Temp 97.9, heart rate 95-135, respiratory rate 18-26, blood pressure 80/46-118/64, satting at 100% No leukocytosis White blood cell  count of 8.4, hemoglobin 10.0 Chemistry shows a hypokalemia T. bili is elevated at 2.2, lactic acid is elevated at 3.2, but did come down to 1.2 after boluses Patient also has a hypocalcemia Chest x-ray shows no active disease Blood culture pending EKG shows a heart rate of 122 with sinus tach Admission requested for syncope work-up  Assessment and Plan: * Syncope - Thought to be related to hypotension and orthostatics - Patient's blood pressure as low as 80/46 - 3-1/2 L normal saline bolus in the ED, continue IV fluids - Start midodrine - Syncope work-up with echo, and monitoring on telemetry - Continue to monitor  Lactic acidosis Lactic acid initially 3.2, after fluid boluses improved to 1.2 Procalcitonin is not indicative of bacterial infection at 0.21 Thought to be related to dehydration Continue IV fluids  Hypocalcemia - 1 g calcium gluconate replaced, trend in the a.m.  Hypokalemia - Potassium down to 3.3 -Replace with 40 mEq potassium - Trend in the a.m.  Primary peritoneal carcinomatosis ([place]) - Currently undergoing treatment - Treatment for this is the reason patient has no appetite and poor p.o. intake - Encourage p.o. intake  Hyperlipidemia - Continue statin      Advance Care Planning:   Code Status: Full Code full  Consults: None  Family  Communication: Daughter at bedside  Severity of Illness: The appropriate patient status for this patient is OBSERVATION. Observation status is judged to be reasonable and necessary in order to provide the required intensity of service to ensure the patient's safety. The patient's presenting symptoms, physical exam findings, and initial radiographic and laboratory data in the context of their medical condition is felt to place them at decreased risk for further clinical deterioration. Furthermore, it is anticipated that the patient will be medically stable for discharge from the hospital within 2 midnights of admission.   Author: Rolla Plate, DO 02/17/2022 5:58 AM  For on call review [email or web address].

## 2022-02-17 NOTE — Assessment & Plan Note (Addendum)
Continue statin. 

## 2022-02-17 NOTE — ED Notes (Signed)
2000 ml NS has infused, pt still needs 500 ml - informed Alyse Low, RN on 3rd floor

## 2022-02-17 NOTE — Progress Notes (Signed)
Patient has continued low blood pressure.  Notified MD.  Patient not symptomatic at this time, but has refused orthostatics as well due to anxiety/ fear of falling.  Orders to be done per MD order.

## 2022-02-17 NOTE — Progress Notes (Signed)
Notified MD of potassium level. (2.6)  MD ordered runs of potassium.

## 2022-02-17 NOTE — Progress Notes (Signed)
Patient reported tremors and shaking. Charge nurse checked patients blood glucose was 71 mg/dL. Patient refused to drink anything except water. Patient stated she has not been able to eat anything for about a month.  MD Dyann Kief made aware.

## 2022-02-18 DIAGNOSIS — E785 Hyperlipidemia, unspecified: Secondary | ICD-10-CM | POA: Diagnosis not present

## 2022-02-18 DIAGNOSIS — E876 Hypokalemia: Secondary | ICD-10-CM | POA: Diagnosis not present

## 2022-02-18 DIAGNOSIS — R55 Syncope and collapse: Secondary | ICD-10-CM | POA: Diagnosis not present

## 2022-02-18 LAB — GLUCOSE, CAPILLARY
Glucose-Capillary: 102 mg/dL — ABNORMAL HIGH (ref 70–99)
Glucose-Capillary: 116 mg/dL — ABNORMAL HIGH (ref 70–99)
Glucose-Capillary: 111 mg/dL — ABNORMAL HIGH (ref 70–99)
Glucose-Capillary: 105 mg/dL — ABNORMAL HIGH (ref 70–99)
Glucose-Capillary: 86 mg/dL (ref 70–99)

## 2022-02-18 LAB — CBC WITH DIFFERENTIAL/PLATELET
Abs Immature Granulocytes: 0 10*3/uL (ref 0.00–0.07)
Band Neutrophils: 7 %
Basophils Absolute: 0 10*3/uL (ref 0.0–0.1)
Basophils Relative: 0 %
Eosinophils Absolute: 0 10*3/uL (ref 0.0–0.5)
Eosinophils Relative: 0 %
HCT: 25.2 % — ABNORMAL LOW (ref 36.0–46.0)
Hemoglobin: 8 g/dL — ABNORMAL LOW (ref 12.0–15.0)
Lymphocytes Relative: 17 %
Lymphs Abs: 0.6 10*3/uL — ABNORMAL LOW (ref 0.7–4.0)
MCH: 30.5 pg (ref 26.0–34.0)
MCHC: 31.7 g/dL (ref 30.0–36.0)
MCV: 96.2 fL (ref 80.0–100.0)
Monocytes Absolute: 0.2 10*3/uL (ref 0.1–1.0)
Monocytes Relative: 5 %
Neutro Abs: 2.7 10*3/uL (ref 1.7–7.7)
Neutrophils Relative %: 71 %
Platelets: 213 10*3/uL (ref 150–400)
RBC: 2.62 MIL/uL — ABNORMAL LOW (ref 3.87–5.11)
RDW: 28.2 % — ABNORMAL HIGH (ref 11.5–15.5)
WBC: 3.5 10*3/uL — ABNORMAL LOW (ref 4.0–10.5)
nRBC: 0 % (ref 0.0–0.2)

## 2022-02-18 LAB — BASIC METABOLIC PANEL
Anion gap: 5 (ref 5–15)
BUN: 6 mg/dL (ref 6–20)
CO2: 24 mmol/L (ref 22–32)
Calcium: 7.4 mg/dL — ABNORMAL LOW (ref 8.9–10.3)
Chloride: 105 mmol/L (ref 98–111)
Creatinine, Ser: 0.55 mg/dL (ref 0.44–1.00)
GFR, Estimated: >60 mL/min (ref >60)
Glucose, Bld: 113 mg/dL — ABNORMAL HIGH (ref 70–99)
Potassium: 3.5 mmol/L (ref 3.5–5.1)
Sodium: 134 mmol/L — ABNORMAL LOW (ref 135–145)

## 2022-02-18 LAB — MAGNESIUM: Magnesium: 1.8 mg/dL (ref 1.7–2.4)

## 2022-02-18 LAB — PROCALCITONIN: Procalcitonin: 0.47 ng/mL

## 2022-02-18 MED ORDER — SODIUM CHLORIDE 0.9 % IV SOLN
1.0000 g | INTRAVENOUS | Status: DC
  Administered 2022-02-18 – 2022-02-20 (×3): 1 g via INTRAVENOUS
  Filled 2022-02-18 (×3): qty 10

## 2022-02-18 MED ORDER — SODIUM CHLORIDE 0.9 % IV BOLUS
500.0000 mL | Freq: Once | INTRAVENOUS | Status: AC
  Administered 2022-02-18: 500 mL via INTRAVENOUS

## 2022-02-18 NOTE — Progress Notes (Signed)
Progress Note   Patient: Nicole Santos [id number]:[id number] DOB: 1970/07/14 DOA: 02/16/2022     1 DOS: the patient was seen and examined on 02/18/2022   Brief hospital course: Nicole Santos is a 51 y.o. female with medical history significant of anxiety, cancer, depression, history of diabetes mellitus type 2, hypertension, and more presents ED with a chief complaint of loss of consciousness.  Patient reports that she got up to go to the bathroom and passed out.  She was only a couple of steps into her journey to the bathroom when she woke up on the floor.  She reports she had no preceding symptoms.  She did hit her head because she "busted her lip."  She reports she was immediately oriented on waking.  She had no change in vision, no asymmetric weakness, no difficulty speaking or swallowing.  Patient denies any chest pain, shortness of breath, headache, dizziness.  Patient reports that she has had poor p.o. intake because her Neulasta makes everything tastes bad.  As soon as the effect of that starts to wear off its time for her to get a new shot of patient reports she has had decreased urine output and darker urine at home.  She has had nausea and vomiting x1.  Patient has no other complaints at this time.   Patient does not smoke, does not drink, does not use illicit drugs.  She is not vaccinated for COVID.  Patient is full code.  Assessment and Plan: * Syncope -Thought to be related to hypotension and orthostatics - Patient's blood pressure as low as 80/50 - overall better after fluid resuscitation -Continue the use of midodrine -Follow echo results and fluid resuscitation. -Minimize agents/medications to control blood pressure -No acute sources of infection appreciated.    Lactic acidosis - In the setting of dehydration and restoration. -Improved with fluid resuscitation -Patient encouraged to maintain adequate oral intake -Continue IV fluids.  Hypocalcemia - Continue to follow ultralights and  further replete as needed.  Hypokalemia - Potassium repleted and within normal limit current -Continue to follow electrolytes trend.   Primary peritoneal carcinomatosis ([place]) -Currently undergoing treatment - Treatment for this is the reason patient has no appetite and poor p.o. intake - Continue to encourage oral intake, feeding supplement -Patient very frail and deconditioned.  She might benefit of TPN. -Oncology service consulted.  Hyperlipidemia -Continue statin  Subjective:  Currently afebrile, continues to display fluctuation in her blood pressure is soft, but stable with use of midodrine.    Physical Exam: Vitals:   02/18/22 0900 02/18/22 0922 02/18/22 1016 02/18/22 1132  BP: (!) 80/50  (!) 104/59 (!) 102/56  Pulse: 99   77  Resp: 16     Temp: 99.4 F (37.4 C)   99.5 F (37.5 C)  TempSrc: Axillary   Oral  SpO2: 94% 90%    Weight:      Height:       General exam: Alert, awake, oriented x 3; reports feeling slightly better.  No chest pain, no shortness of breath.  Generally weak and deconditioned.  Still having decreased appetite and expressed inability to eat due to bad taste.  Respiratory system: Clear to auscultation. Respiratory effort normal.  No using accessory muscles.  Good saturation on room air. Cardiovascular system: Rate controlled; no rubs or gallops. Gastrointestinal system: Abdomen is obese, nondistended, soft and nontender to palpation.. No organomegaly or masses felt. Normal bowel sounds heard. Central nervous system: Alert and oriented. No focal neurological deficits.  Extremities: No cyanosis or clubbing. Skin: No rashes, lesions or ulcers Psychiatry: Judgement and insight appear normal. Mood & affect appropriate.   Data Reviewed: Procalcitonin 0.47 CBC: WBC 3.5, hemoglobin 9.0, platelet count 997 K Basic metabolic panel: Sodium 741, potassium 3.5, chloride 105, bicarb 24, glucose 113, BUN 6, creatinine 0.55 Magnesium: 1.8 CBGs: In the 102-116  range.  Family Communication: No family at bedside; husband and daughter updated on night 02/17/22.  Disposition: Status is: Inpatient Remains inpatient appropriate because: Requiring IV therapy.   Planned Discharge Destination: Home with Home Health  Author: Barton Dubois, MD 02/18/2022 2:51 PM  For on call review [email or web address].

## 2022-02-18 NOTE — Progress Notes (Signed)
BP was low this morning and Dr. Dyann Kief ordered a 500 ml NS bolus and continues to get scheduled midodrine.  Has fluids infusing at 100/hr, also.  Does not want to eat anything .  Has ambulated to bathroom with one assist three times today.  Urine sample sent to lab

## 2022-02-18 NOTE — Progress Notes (Signed)
   02/18/22 0900  Assess: MEWS Score  Temp 99.4 F (37.4 C)  BP (!) 80/50  Pulse Rate 99  Resp 16  SpO2 94 %  O2 Device Room Air  Assess: MEWS Score  MEWS Temp 0  MEWS Systolic 2  MEWS Pulse 0  MEWS RR 0  MEWS LOC 0  MEWS Score 2  MEWS Score Color Yellow  Assess: if the MEWS score is Yellow or Red  Were vital signs taken at a resting state? Yes  Focused Assessment Change from prior assessment (see assessment flowsheet)  Does the patient meet 2 or more of the SIRS criteria? Yes  Does the patient have a confirmed or suspected source of infection? No  Treat  MEWS Interventions Other (Comment);Administered scheduled meds/treatments (Dr. Dyann Kief ordered bolus for bp)  Pain Scale 0-10  Take Vital Signs  Increase Vital Sign Frequency  Yellow: Q 2hr X 2 then Q 4hr X 2, if remains yellow, continue Q 4hrs  Escalate  MEWS: Escalate Yellow: discuss with charge nurse/RN and consider discussing with provider and RRT  Notify: Charge Nurse/RN  Name of Charge Nurse/RN Notified [place]  Date Charge Nurse/RN Notified 02/18/22  Time Charge Nurse/RN Notified 0915  Notify: Provider  Provider Name/Title Dr. Dyann Kief  Date Provider Notified 02/18/22  Time Provider Notified 0900  Method of Notification Page  Notification Reason Change in status  Provider response See new orders  Date of Provider Response 02/18/22  Time of Provider Response 0910  Document  Patient Outcome Other (Comment) (will recheck bp)  Assess: SIRS CRITERIA  SIRS Temperature  0  SIRS Pulse 1  SIRS Respirations  0  SIRS WBC 1  SIRS Score Sum  2

## 2022-02-18 NOTE — Progress Notes (Signed)
Patient ambulated to bathroom by nurse tech. Patient became very lightheaded, BP 90/48 and HR 138. Patient transferred back to bed via wheelchair. HR now 103. Dr. Clearence Ped notified through secure chat.

## 2022-02-19 ENCOUNTER — Encounter: Payer: Commercial Managed Care - PPO | Admitting: Gastroenterology

## 2022-02-19 DIAGNOSIS — R55 Syncope and collapse: Secondary | ICD-10-CM | POA: Diagnosis not present

## 2022-02-19 DIAGNOSIS — E785 Hyperlipidemia, unspecified: Secondary | ICD-10-CM | POA: Diagnosis not present

## 2022-02-19 DIAGNOSIS — E876 Hypokalemia: Secondary | ICD-10-CM | POA: Diagnosis not present

## 2022-02-19 LAB — GLUCOSE, CAPILLARY
Glucose-Capillary: 94 mg/dL (ref 70–99)
Glucose-Capillary: 92 mg/dL (ref 70–99)
Glucose-Capillary: 106 mg/dL — ABNORMAL HIGH (ref 70–99)
Glucose-Capillary: 114 mg/dL — ABNORMAL HIGH (ref 70–99)

## 2022-02-19 LAB — PROCALCITONIN: Procalcitonin: 0.18 ng/mL

## 2022-02-19 MED ORDER — PHENAZOPYRIDINE HCL 100 MG PO TABS: 100.0000 mg | ORAL_TABLET | Freq: Three times a day (TID) | ORAL | Status: DC | PRN

## 2022-02-19 MED ORDER — ENSURE ENLIVE PO LIQD: 237.0000 mL | Freq: Two times a day (BID) | ORAL | Status: DC

## 2022-02-19 NOTE — TOC Initial Note (Signed)
Transition of Care [facility]) - Initial/Assessment Note    Patient Details  Name: Nicole Santos MRN: [id number] Date of Birth: 04-07-1971  [facility] CM/SW Contact:    Salome Arnt, [place] Phone Number: 02/19/2022, 3:38 PM  Clinical Narrative:  Pt admitted due to syncope. Pt reports she lives with her husband, daughter, and grandchildren. Her daughter is a CNA and assists pt with bathing, but pt is otherwise independent with ADLs. PT evaluated pt over weekend and recommend SNF. Pt states she did much better with PT today. Discussed SNF and pt refuses. She also feels home health is not necessary at this time. TOC will continue to follow and address any d/c needs.                   Expected Discharge Plan: Home/Self Care Barriers to Discharge: Continued Medical Work up   Patient Goals and CMS Choice Patient states their goals for this hospitalization and ongoing recovery are:: return home   Choice offered to / list presented to : Patient  Expected Discharge Plan and Services Expected Discharge Plan: Home/Self Care In-house Referral: Clinical Social Work     Living arrangements for the past 2 months: Single Family Home                                      Prior Living Arrangements/Services Living arrangements for the past 2 months: Single Family Home Lives with:: Spouse, Adult Children Patient language and need for interpreter reviewed:: Yes Do you feel safe going back to the place where you live?: Yes      Need for Family Participation in Patient Care: No (Comment) Care giver support system in place?: Yes (comment) Current home services: DME ([employer]ator, [employer]) Criminal Activity/Legal Involvement Pertinent to Current Situation/Hospitalization: No - Comment as needed  Activities of Daily Living Home Assistive Devices/Equipment: Eyeglasses ADL Screening (condition at time of admission) Patient's cognitive ability adequate to safely complete daily  activities?: Yes Is the patient deaf or have difficulty hearing?: Yes (ringing in the ears) Does the patient have difficulty seeing, even when wearing glasses/contacts?: No Does the patient have difficulty concentrating, remembering, or making decisions?: No Patient able to express need for assistance with ADLs?: Yes Does the patient have difficulty dressing or bathing?: Yes Independently performs ADLs?: No Communication: Independent Dressing (OT): Needs assistance Is this a change from baseline?: Pre-admission baseline Grooming: Needs assistance Is this a change from baseline?: Pre-admission baseline Feeding: Independent Bathing: Needs assistance Is this a change from baseline?: Pre-admission baseline Toileting: Needs assistance Is this a change from baseline?: Pre-admission baseline In/Out Bed: Needs assistance Is this a change from baseline?: Pre-admission baseline Walks in Home: Independent Does the patient have difficulty walking or climbing stairs?: Yes Weakness of Legs: Both Weakness of Arms/Hands: Both  Permission Sought/Granted                  Emotional Assessment     Affect (typically observed): Appropriate Orientation: : Oriented to Self, Oriented to Place, Oriented to  Time, Oriented to Situation Alcohol / Substance Use: Not Applicable Psych Involvement: No (comment)  Admission diagnosis:  Hyponatremia [E87.1] Syncope [R55] Lactic acid acidosis [E87.20] Syncope, unspecified syncope type [R55] Hypotension due to hypovolemia [I95.89, E86.1] Patient Active Problem List   Diagnosis Date Noted   Syncope 02/17/2022   Hypokalemia 02/17/2022   Hypocalcemia 02/17/2022   Lactic acidosis 02/17/2022  Primary peritoneal carcinomatosis ([place]) 11/17/2021   Malignant neoplasm of ovary (HCC)    Malignant ascites 11/13/2021   Obesity (BMI 30.0-34.9) 04/02/2021   Back pain with sciatica 05/31/2020   Hyperlipidemia 10/03/2016   Dyspnea on exertion 08/01/2016    Sciatica of left side 07/04/2016   Essential hypertension 07/04/2016   PCP:  Loman Brooklyn, FNP Pharmacy:   [id number] [place] [place]Phone: [phone]: [phone]    Social Determinants of Health (SDOH) Interventions    Readmission Risk Interventions     No data to display

## 2022-02-19 NOTE — Progress Notes (Signed)
Physical Therapy Treatment Patient Details Name: Nicole Santos MRN: [id number] DOB: 01/02/1971 Today's Date: 02/19/2022   History of Present Illness Nicole Santos is a 51 y.o. female with medical history significant of anxiety, cancer, depression, history of diabetes mellitus type 2, hypertension, and more presents ED with a chief complaint of loss of consciousness.  Patient reports that she got up to go to the bathroom and passed out.  She was only a couple of steps into her journey to the bathroom when she woke up on the floor.  She reports she had no preceding symptoms.  She did hit her head because she "busted her lip."  She reports she was immediately oriented on waking.  She had no change in vision, no asymmetric weakness, no difficulty speaking or swallowing.  Patient denies any chest pain, shortness of breath, headache, dizziness.  Patient reports that she has had poor p.o. intake because her Neulasta makes everything tastes bad.  As soon as the effect of that starts to wear off its time for her to get a new shot of patient reports she has had decreased urine output and darker urine at home.  She has had nausea and vomiting x1.  Patient has no other complaints at this time.    PT Comments    Patient demonstrates increased endurance/distance for gait training requiring use of RW gait belt due to having to lean on walls for support without AD, had no c/o dizziness other than mild lightheadedness and tolerated sitting up in chair after therapy.  Patient will benefit from continued skilled physical therapy in hospital and recommended venue below to increase strength, balance, endurance for safe ADLs and gait.    Recommendations for follow up therapy are one component of a multi-disciplinary discharge planning process, led by the attending physician.  Recommendations may be updated based on patient status, additional functional criteria and insurance authorization.  Follow Up Recommendations  Skilled  nursing-short term rehab (<3 hours/day) Can patient physically be transported by private vehicle: Yes   Assistance Recommended at Discharge Set up Supervision/Assistance  Patient can return home with the following A little help with walking and/or transfers;A little help with bathing/dressing/bathroom;Assistance with cooking/housework;Help with stairs or ramp for entrance   Equipment Recommendations  None recommended by PT    Recommendations for Other Services       Precautions / Restrictions Precautions Precautions: Fall Restrictions Weight Bearing Restrictions: No     Mobility  Bed Mobility Overal bed mobility: Needs Assistance Bed Mobility: Supine to Sit     Supine to sit: Supervision     General bed mobility comments: fair/good return for sitting up with HOB flat    Transfers Overall transfer level: Needs assistance Equipment used: None, Rolling walker (2 wheels) Transfers: Sit to/from Stand, Bed to chair/wheelchair/BSC Sit to Stand: Min guard   Step pivot transfers: Min guard       General transfer comment: has to lean on armrest of chair when not using AD, safer using RW    Ambulation/Gait Ambulation/Gait assistance: Min guard, Min assist Gait Distance (Feet): 65 Feet Assistive device: Rolling walker (2 wheels) Gait Pattern/deviations: Decreased step length - right, Decreased step length - left, Decreased stride length Gait velocity: decreased     General Gait Details: labored cadence with frequent leaning on nearby objects for support when not using an AD, required use of RW for safety with increased endurance for gait training, limited mostly due to fatigue   Stairs  Wheelchair Mobility    Modified Rankin (Stroke Patients Only)       Balance Overall balance assessment: Needs assistance Sitting-balance support: Feet supported, No upper extremity supported Sitting balance-Leahy Scale: Good Sitting balance - Comments: seated at  EOB   Standing balance support: During functional activity, No upper extremity supported Standing balance-Leahy Scale: Poor Standing balance comment: fair/poor without AD, fair using RW                            Cognition Arousal/Alertness: Awake/alert Behavior During Therapy: WFL for tasks assessed/performed Overall Cognitive Status: Within Functional Limits for tasks assessed                                          Exercises General Exercises - Lower Extremity Long Arc Quad: Seated, AROM, Strengthening, Both, 10 reps Hip Flexion/Marching: Seated, AROM, Strengthening, Both, 10 reps Toe Raises: Seated, AROM, Strengthening, Both, 10 reps Heel Raises: Seated, AROM, Strengthening, Both, 10 reps    General Comments        Pertinent Vitals/Pain Pain Assessment Pain Assessment: No/denies pain    Home Living                          Prior Function            PT Goals (current goals can now be found in the care plan section) Acute Rehab PT Goals Patient Stated Goal: return home PT Goal Formulation: With patient Time For Goal Achievement: 03/03/22 Potential to Achieve Goals: Good Progress towards PT goals: Progressing toward goals    Frequency    Min 3X/week      PT Plan Current plan remains appropriate    Co-evaluation              AM-PAC PT "6 Clicks" Mobility   Outcome Measure  Help needed turning from your back to your side while in a flat bed without using bedrails?: None Help needed moving from lying on your back to sitting on the side of a flat bed without using bedrails?: A Little Help needed moving to and from a bed to a chair (including a wheelchair)?: A Little Help needed standing up from a chair using your arms (e.g., wheelchair or bedside chair)?: A Little Help needed to walk in hospital room?: A Little Help needed climbing 3-5 steps with a railing? : A Lot 6 Click Score: 18    End of Session    Activity Tolerance: Patient tolerated treatment well;Patient limited by fatigue Patient left: in chair;with call bell/phone within reach Nurse Communication: Mobility status PT Visit Diagnosis: Unsteadiness on feet (R26.81);Other abnormalities of gait and mobility (R26.89);Muscle weakness (generalized) (M62.81)     Time: 2585-2778 PT Time Calculation (min) (ACUTE ONLY): 24 min  Charges:  $Gait Training: 8-22 mins $Therapeutic Exercise: 8-22 mins                     2:53 PM, 02/19/22 Lonell Grandchild, MPT Physical Therapist with [facility] [phone] office [phone] mobile phone

## 2022-02-19 NOTE — Progress Notes (Addendum)
Upon review of patient's chart prior to scheduled surgery for tomorrow, it is noted the patient is currently admitted at [facility]. Spoke with RN and called patient in her room at [phone]. She is currently still receiving IV rocephin for UTI. Patient states she was fine one day then the next day not. Feeling rough at this time. No needs voiced.   Surgery for 9/19 at [facility] had been cancelled at this time. We will monitor her status. Phone visit vs in person visit to be arranged for next week based on how patient is doing per Dr. Berline Lopes.

## 2022-02-19 NOTE — Progress Notes (Signed)
Progress Note   Patient: Nicole Santos:[id number] DOB: April 08, 1971 DOA: 02/16/2022     2 DOS: the patient was seen and examined on 02/19/2022   Brief hospital course: Nicole Santos is a 51 y.o. female with medical history significant of anxiety, cancer, depression, history of diabetes mellitus type 2, hypertension, and more presents ED with a chief complaint of loss of consciousness.  Patient reports that she got up to go to the bathroom and passed out.  She was only a couple of steps into her journey to the bathroom when she woke up on the floor.  She reports she had no preceding symptoms.  She did hit her head because she "busted her lip."  She reports she was immediately oriented on waking.  She had no change in vision, no asymmetric weakness, no difficulty speaking or swallowing.  Patient denies any chest pain, shortness of breath, headache, dizziness.  Patient reports that she has had poor p.o. intake because her Neulasta makes everything tastes bad.  As soon as the effect of that starts to wear off its time for her to get a new shot of patient reports she has had decreased urine output and darker urine at home.  She has had nausea and vomiting x1.  Patient has no other complaints at this time.   Patient does not smoke, does not drink, does not use illicit drugs.  She is not vaccinated for COVID.  Patient is full code.  Assessment and Plan: * Syncope -Thought to be related to hypotension and orthostatics - Patient's blood pressure as low as 80/50 - overall better after fluid resuscitation -Continue the use of midodrine -Follow echo results and fluid resuscitation. -Minimize agents/medications to control blood pressure -No acute sources of infection appreciated.    Lactic acidosis - In the setting of dehydration and restoration. -Improved with fluid resuscitation -Patient encouraged to maintain adequate oral intake -Continue IV fluids.  Hypocalcemia - Continue to follow ultralights and  further replete as needed.  Hypokalemia - Potassium repleted and within normal limit current -Continue to follow electrolytes trend.   Primary peritoneal carcinomatosis ([place]) -Currently undergoing treatment - Treatment for this is the reason patient has no appetite and poor p.o. intake - Continue to encourage oral intake, feeding supplement -Patient very frail and deconditioned.  She might benefit of TPN. -Oncology service consulted.  Hyperlipidemia -Continue statin  Dysuria/UTI -Patient complaining of dysuria -Urinalysis with positive nitrites -Low-grade temperature at time of admission -Continue empirical antibiotic -Follow culture result -Continue to maintain adequate hydration.  Subjective:  No fever, no chest pain, no nausea vomiting.  Reports poor appetite and just generalized weakness; she is overall feeling better.  Expressed dysuria.  Physical Exam: Vitals:   02/19/22 0455 02/19/22 0500 02/19/22 0829 02/19/22 1231  BP: (!) 84/51  (!) 102/59 105/70  Pulse: 100  78 63  Resp: 18   15  Temp: 98.2 F (36.8 C)   98.2 F (36.8 C)  TempSrc: Oral   Oral  SpO2: 100%   100%  Weight:  88.5 kg    Height:       General exam: Alert, awake, oriented x 3; no fever.  Reports feeling better.  No nausea or vomiting.  Continues to have poor appetite.  Reports dysuria. Respiratory system: Clear to auscultation. Respiratory effort normal.  Good saturation on room air. Cardiovascular system:RRR. No murmurs, rubs, gallops.  No JVD. Gastrointestinal system: Abdomen is nondistended, soft and nontender. No organomegaly or masses felt. Normal bowel  sounds heard. Central nervous system: Alert and oriented. No focal neurological deficits. Extremities: No cyanosis or clubbing. Skin: No petechiae. Psychiatry: Judgement and insight appear normal.  Flat affect.   Data Reviewed: Procalcitonin 0.47>>>0.18 CBC: WBC 3.5, hemoglobin 9.0, platelet count 707 K Basic metabolic panel: Sodium 867,  potassium 3.5, chloride 105, bicarb 24, glucose 113, BUN 6, creatinine 0.55 Magnesium: 1.8 CBGs: In the 102-116 range. Urinalysis with positive nitrites; patient reporting dysuria.  Urine culture pending.  Family Communication: No family at bedside; husband and daughter updated on night 02/17/22.  Disposition: Status is: Inpatient Remains inpatient appropriate because: Requiring IV antibiotic therapy; electrolyte repletion and further supportive care. .   Planned Discharge Destination: Home with Home Health  Author: Barton Dubois, MD 02/19/2022 4:20 PM  For on call review [email or web address].

## 2022-02-19 NOTE — Telephone Encounter (Signed)
Spoke with office - I will fill out the form, have [place] sign it Tues AM and fax back - pt is sche'd for surg Tuesday at 3 pm

## 2022-02-20 ENCOUNTER — Encounter (HOSPITAL_COMMUNITY): Admission: RE | Payer: Self-pay | Source: Home / Self Care

## 2022-02-20 ENCOUNTER — Inpatient Hospital Stay (HOSPITAL_COMMUNITY)
Admission: RE | Admit: 2022-02-20 | Payer: Commercial Managed Care - PPO | Source: Home / Self Care | Admitting: Gynecologic Oncology

## 2022-02-20 DIAGNOSIS — C579 Malignant neoplasm of female genital organ, unspecified: Secondary | ICD-10-CM

## 2022-02-20 DIAGNOSIS — R180 Malignant ascites: Secondary | ICD-10-CM

## 2022-02-20 DIAGNOSIS — E876 Hypokalemia: Secondary | ICD-10-CM | POA: Diagnosis not present

## 2022-02-20 DIAGNOSIS — R55 Syncope and collapse: Secondary | ICD-10-CM | POA: Diagnosis not present

## 2022-02-20 DIAGNOSIS — E785 Hyperlipidemia, unspecified: Secondary | ICD-10-CM | POA: Diagnosis not present

## 2022-02-20 LAB — BASIC METABOLIC PANEL
Anion gap: 2 — ABNORMAL LOW (ref 5–15)
BUN: <5 mg/dL — ABNORMAL LOW (ref 6–20)
CO2: 21 mmol/L — ABNORMAL LOW (ref 22–32)
Calcium: 7.3 mg/dL — ABNORMAL LOW (ref 8.9–10.3)
Chloride: 115 mmol/L — ABNORMAL HIGH (ref 98–111)
Creatinine, Ser: 0.47 mg/dL (ref 0.44–1.00)
GFR, Estimated: >60 mL/min (ref >60)
Glucose, Bld: 99 mg/dL (ref 70–99)
Potassium: 4.2 mmol/L (ref 3.5–5.1)
Sodium: 138 mmol/L (ref 135–145)

## 2022-02-20 LAB — CBC
HCT: 21.3 % — ABNORMAL LOW (ref 36.0–46.0)
Hemoglobin: 6.8 g/dL — CL (ref 12.0–15.0)
MCH: 31.5 pg (ref 26.0–34.0)
MCHC: 31.9 g/dL (ref 30.0–36.0)
MCV: 98.6 fL (ref 80.0–100.0)
Platelets: 159 10*3/uL (ref 150–400)
RBC: 2.16 MIL/uL — ABNORMAL LOW (ref 3.87–5.11)
RDW: 29.2 % — ABNORMAL HIGH (ref 11.5–15.5)
WBC: 2.3 10*3/uL — ABNORMAL LOW (ref 4.0–10.5)
nRBC: 0 % (ref 0.0–0.2)

## 2022-02-20 LAB — TYPE AND SCREEN
ABO/RH(D): B NEG
Antibody Screen: NEGATIVE

## 2022-02-20 LAB — HEMOGLOBIN AND HEMATOCRIT, BLOOD
HCT: 20.7 % — ABNORMAL LOW (ref 36.0–46.0)
Hemoglobin: 6.6 g/dL — CL (ref 12.0–15.0)

## 2022-02-20 LAB — MAGNESIUM: Magnesium: 1.6 mg/dL — ABNORMAL LOW (ref 1.7–2.4)

## 2022-02-20 LAB — PREPARE RBC (CROSSMATCH)

## 2022-02-20 LAB — URINE CULTURE: Culture: NO GROWTH

## 2022-02-20 LAB — GLUCOSE, CAPILLARY
Glucose-Capillary: 105 mg/dL — ABNORMAL HIGH (ref 70–99)
Glucose-Capillary: 90 mg/dL (ref 70–99)
Glucose-Capillary: 104 mg/dL — ABNORMAL HIGH (ref 70–99)
Glucose-Capillary: 102 mg/dL — ABNORMAL HIGH (ref 70–99)

## 2022-02-20 SURGERY — HYSTERECTOMY, ABDOMINAL, WITH SALPINGO-OOPHORECTOMY
Anesthesia: General

## 2022-02-20 MED ORDER — SODIUM CHLORIDE 0.9% IV SOLUTION: Freq: Once | INTRAVENOUS | Status: AC

## 2022-02-20 MED ORDER — MAGNESIUM SULFATE 2 GM/50ML IV SOLN
2.0000 g | Freq: Once | INTRAVENOUS | Status: AC
  Administered 2022-02-20: 2 g via INTRAVENOUS
  Filled 2022-02-20: qty 50

## 2022-02-20 MED ORDER — FUROSEMIDE 10 MG/ML IJ SOLN
20.0000 mg | Freq: Once | INTRAMUSCULAR | Status: AC
  Administered 2022-02-20: 20 mg via INTRAVENOUS
  Filled 2022-02-20: qty 2

## 2022-02-20 NOTE — Progress Notes (Signed)
Progress Note   Patient: Nicole Santos DPO:[id number] DOB: Jun 24, 1970 DOA: 02/16/2022     3 DOS: the patient was seen and examined on 02/20/2022   Brief hospital course: Nicole Santos is a 51 y.o. female with medical history significant of anxiety, cancer, depression, history of diabetes mellitus type 2, hypertension, and more presents ED with a chief complaint of loss of consciousness.  Patient reports that she got up to go to the bathroom and passed out.  She was only a couple of steps into her journey to the bathroom when she woke up on the floor.  She reports she had no preceding symptoms.  She did hit her head because she "busted her lip."  She reports she was immediately oriented on waking.  She had no change in vision, no asymmetric weakness, no difficulty speaking or swallowing.  Patient denies any chest pain, shortness of breath, headache, dizziness.  Patient reports that she has had poor p.o. intake because her Neulasta makes everything tastes bad.  As soon as the effect of that starts to wear off its time for her to get a new shot of patient reports she has had decreased urine output and darker urine at home.  She has had nausea and vomiting x1.  Patient has no other complaints at this time.   Patient does not smoke, does not drink, does not use illicit drugs.  She is not vaccinated for COVID.  Patient is full code.  Assessment and Plan: * Syncope -Thought to be related to hypotension and orthostatics - Patient's blood pressure as low as 80/50 at time of admission. - overall better after fluid resuscitation and the use of midodrine. -Continue current management/supportive care and follow vital signs. -2-D Echo demonstrating normal ejection fraction; no wall motion abnormalities. -continue to Minimize agents/medications to control blood pressure -Outside the presence of UTI and no other source of infection appreciated.  Lactic acidosis - In the setting of dehydration and  restoration. -Improved with fluid resuscitation -Patient encouraged to maintain adequate oral intake -Continue IV fluids.  Hypocalcemia - Continue to follow ultralights and further replete as needed.  Hypokalemia - Potassium repleted and within normal limit current -Continue to follow electrolytes trend.   Primary peritoneal carcinomatosis ([place]) -Currently undergoing treatment - Treatment for this is the reason patient has no appetite and poor p.o. intake - Continue to encourage oral intake, feeding supplement -Patient very frail and deconditioned.  She might benefit of TPN. -Oncology service consulted.  Hyperlipidemia -Continue statin  Dysuria/UTI -Patient reports improvement in dysuria with the use of Pyridium. -Urinalysis with positive nitrites -Low-grade temperature at time of admission -Continue empirical antibiotic using Rocephin. -Continue to follow culture results and sensitivity. -Continue to maintain adequate hydration.  Pancytopenia -Most likely triggered by the use of chemo and anemia of chronic disease. -Currently demonstrating leukopenia and anemia with hemoglobin down to 6.8. -Platelet counts are currently stable. -No overt bleeding appreciated -2 unit PRBCs to further stabilize hemoglobin will be provided -Follow hemoglobin trend.  Subjective:  Reports improvement in her dysuria; feeling weak and tired.  No chest pain, no nausea, no vomiting, no shortness of breath.  Continues to demonstrate decreased oral intake and appetite.  Physical Exam: Vitals:   02/20/22 0410 02/20/22 0500 02/20/22 0847 02/20/22 0848  BP: (!) 105/56  108/62 108/62  Pulse: (!) 59  85 85  Resp: '18  18 18  '$ Temp: (!) 97 F (36.1 C)  97.9 F (36.6 C) 97.9 F (36.6 C)  TempSrc:   Oral Oral  SpO2: 100%  100% 100%  Weight:  89.6 kg    Height:       General exam: Alert, awake, oriented x 3; afebrile.  Reports feeling slightly better.  Still generally weak and deconditioned.   Improvement in her dysuria reported.  No nausea or vomiting.  Decreased oral intake is still appreciated. Respiratory system: Clear to auscultation. Respiratory effort normal.  Good saturation on room air. Cardiovascular system:RRR. No rubs or gallops; no JVD. Gastrointestinal system: Abdomen is nondistended, soft and nontender. No organomegaly or masses felt. Normal bowel sounds heard. Central nervous system: Alert and oriented. No focal neurological deficits. Extremities: No cyanosis or clubbing. Skin: No petechiae. Psychiatry: Judgement and insight appear normal.  Flat affect appreciated on exam.  Data Reviewed: Procalcitonin 0.47>>>0.18 CBC: White blood cells 2.3, hemoglobin 6.8, platelet count 159 K. Basic metabolic panel: Sodium 488, potassium 4.2, chloride 115, bicarb 21, BUN less than 5, creatinine 0.47, GFR 60. Magnesium 1.6 Urinalysis with positive nitrites; patient reporting dysuria.  Urine culture pending.  Family Communication: No family at bedside; husband and daughter updated on night 02/17/22.  Disposition: Status is: Inpatient Remains inpatient appropriate because: Requiring IV antibiotic therapy; electrolyte repletion and further supportive care. .   Planned Discharge Destination: Home with Home Health  Author: Barton Dubois, MD 02/20/2022 9:21 AM  For on call review [email or web address].

## 2022-02-20 NOTE — Telephone Encounter (Signed)
Faxed this morning - 9/19 at 756 am

## 2022-02-21 DIAGNOSIS — E785 Hyperlipidemia, unspecified: Secondary | ICD-10-CM | POA: Diagnosis not present

## 2022-02-21 DIAGNOSIS — E876 Hypokalemia: Secondary | ICD-10-CM | POA: Diagnosis not present

## 2022-02-21 DIAGNOSIS — R55 Syncope and collapse: Secondary | ICD-10-CM | POA: Diagnosis not present

## 2022-02-21 LAB — GLUCOSE, CAPILLARY
Glucose-Capillary: 103 mg/dL — ABNORMAL HIGH (ref 70–99)
Glucose-Capillary: 132 mg/dL — ABNORMAL HIGH (ref 70–99)
Glucose-Capillary: 130 mg/dL — ABNORMAL HIGH (ref 70–99)

## 2022-02-21 LAB — CBC
HCT: 33 % — ABNORMAL LOW (ref 36.0–46.0)
Hemoglobin: 10.8 g/dL — ABNORMAL LOW (ref 12.0–15.0)
MCH: 31 pg (ref 26.0–34.0)
MCHC: 32.7 g/dL (ref 30.0–36.0)
MCV: 94.8 fL (ref 80.0–100.0)
Platelets: 144 10*3/uL — ABNORMAL LOW (ref 150–400)
RBC: 3.48 MIL/uL — ABNORMAL LOW (ref 3.87–5.11)
RDW: 25.2 % — ABNORMAL HIGH (ref 11.5–15.5)
WBC: 3.4 10*3/uL — ABNORMAL LOW (ref 4.0–10.5)
nRBC: 0 % (ref 0.0–0.2)

## 2022-02-21 LAB — BASIC METABOLIC PANEL
Anion gap: 5 (ref 5–15)
BUN: <5 mg/dL — ABNORMAL LOW (ref 6–20)
CO2: 25 mmol/L (ref 22–32)
Calcium: 7.5 mg/dL — ABNORMAL LOW (ref 8.9–10.3)
Chloride: 107 mmol/L (ref 98–111)
Creatinine, Ser: 0.57 mg/dL (ref 0.44–1.00)
GFR, Estimated: >60 mL/min (ref >60)
Glucose, Bld: 102 mg/dL — ABNORMAL HIGH (ref 70–99)
Potassium: 4.6 mmol/L (ref 3.5–5.1)
Sodium: 137 mmol/L (ref 135–145)

## 2022-02-21 LAB — CULTURE, BLOOD (ROUTINE X 2)
Culture: NO GROWTH
Culture: NO GROWTH
Special Requests: ADEQUATE
Special Requests: ADEQUATE

## 2022-02-21 MED ORDER — MIRTAZAPINE 15 MG PO TABS: 15.0000 mg | ORAL_TABLET | Freq: Every day | ORAL | Status: DC

## 2022-02-21 MED ORDER — MIRTAZAPINE 15 MG PO TABS: 15.0000 mg | ORAL_TABLET | Freq: Every day | ORAL | 3 refills | Status: DC

## 2022-02-21 MED ORDER — ASPIRIN EC 81 MG PO TBEC: 81.0000 mg | DELAYED_RELEASE_TABLET | Freq: Every day | ORAL | 3 refills | Status: DC

## 2022-02-21 MED ORDER — HEPARIN SOD (PORK) LOCK FLUSH 100 UNIT/ML IV SOLN
500.0000 [IU] | Freq: Once | INTRAVENOUS | Status: AC
  Administered 2022-02-21: 500 [IU] via INTRAVENOUS
  Filled 2022-02-21: qty 5

## 2022-02-21 NOTE — Progress Notes (Signed)
Patient stable and ready for discharge home with husband. Patient's IV removed and left chest port deaccessed by RN. Discharge paperwork went over with patient and husband and both verbalized understanding. Writer taking patient to husband's car.

## 2022-02-21 NOTE — Discharge Instructions (Signed)
1) please eat small frequent meals as tolerated-----please maintain adequate hydration... Multivitamin and nutritional supplements strongly advised 2) follow-up with primary care physician or oncologist for repeat CBC and BMP blood work in the next 5 to 6 days

## 2022-02-21 NOTE — Discharge Summary (Signed)
Nicole Santos, is a 51 y.o. female  DOB [birth year]  MRN [id number].  Admission date:  02/16/2022  Admitting Physician  Rolla Plate, DO  Discharge Date:  02/21/2022   Primary MD  Loman Brooklyn, FNP  Recommendations for primary care physician for things to follow:  1) please eat small frequent meals as tolerated-----please maintain adequate hydration... Multivitamin and nutritional supplements strongly advised 2) follow-up with primary care physician or oncologist for repeat CBC and BMP blood work in the next 5 to 6 days  Admission Diagnosis  Hyponatremia [E87.1] Syncope [R55] Lactic acid acidosis [E87.20] Syncope, unspecified syncope type [R55] Hypotension due to hypovolemia [I95.89, E86.1]   Discharge Diagnosis  Hyponatremia [E87.1] Syncope [R55] Lactic acid acidosis [E87.20] Syncope, unspecified syncope type [R55] Hypotension due to hypovolemia [I95.89, E86.1]    Principal Problem:   Syncope Active Problems:   Hyperlipidemia   Primary peritoneal carcinomatosis ([place])   Hypokalemia   Hypocalcemia   Lactic acidosis      Past Medical History:  Diagnosis Date   Acute respiratory disease due to COVID-19 virus 02/02/2019   Anxiety    Cancer ([place])    Depression    Dyspnea    Dysrhythmia    History of diabetes mellitus    Resolved in Sept 2023 with weight loss.   Hypertension    Ovarian cancer ([place]) 11/14/2021   Peritoneal carcinomatosis ([place])    Pneumonia    Sleep apnea    doesn't use CPAP   Tachycardia     Past Surgical History:  Procedure Laterality Date   KNEE SURGERY Right    Arthroscopic   LAPAROSCOPIC ABDOMINAL EXPLORATION N/A 11/13/2021   Procedure: Exploratory laparoscopy;  Surgeon: Aviva Signs, MD;  Location: [facility];  Service: General;  Laterality: N/A;   PORTACATH PLACEMENT Left 11/15/2021   Procedure: INSERTION PORT-A-CATH;  Surgeon: Aviva Signs, MD;  Location:  [facility];  Service: General;  Laterality: Left;   TONSILLECTOMY AND ADENOIDECTOMY       HPI  from the history and physical done on the day of admission:   HPI: Nicole Santos is a 51 y.o. female with medical history significant of anxiety, cancer, depression, history of diabetes mellitus type 2, hypertension, and more presents ED with a chief complaint of loss of consciousness.  Patient reports that she got up to go to the bathroom and passed out.  She was only a couple of steps into her journey to the bathroom when she woke up on the floor.  She reports she had no preceding symptoms.  She did hit her head because she "busted her lip."  She reports she was immediately oriented on waking.  She had no change in vision, no asymmetric weakness, no difficulty speaking or swallowing.  Patient denies any chest pain, shortness of breath, headache, dizziness.  Patient reports that she has had poor p.o. intake because her Neulasta makes everything tastes bad.  As soon as the effect of that starts to wear off its time for her to  get a new shot of patient reports she has had decreased urine output and darker urine at home.  She has had nausea and vomiting x1.  Patient has no other complaints at this time.   Patient does not smoke, does not drink, does not use illicit drugs.  She is not vaccinated for COVID.  Patient is full code. Review of Systems: As mentioned in the history of present illness. All other systems reviewed and are negative.     Hospital Course:   Assessment and Plan: 1)Syncope  -Most likely secondary to hypotension/dehydration with abnormal orthostatics and lactic acidosis - Patient's blood pressure as low as 80/50 at time of admission. - overall better after fluid resuscitation and the use of midodrine. -2-D Echo demonstrating normal ejection fraction; no wall motion abnormalities. -Patient remains hemodynamically stable at this time   2)Chemo and malignancy induced anorexia--- try Remeron  for appetite stimulation -Avoid Megace given underlying malignancy with history of prior DVT  3)Hypocalcemia/hypokalemia -Hydrated and electrolytes replaced. -Avoid dehydration -Repeat BMP within a week  Primary peritoneal carcinomatosis ([place]) -Currently undergoing chemo --Oncology input appreciated  Hyperlipidemia -Continue statin  Acute on chronic anemia--- patient with underlying malignancy and ongoing chemotherapy -Hemoglobin up to 10.8 from 6.6 after transfusion -No bleeding concerns -Follow-up with PCP oncologist for repeat CBC within a week  Discharge Condition: Stable  Follow UP--- PCP and oncologist  Diet and Activity recommendation:  As advised  Discharge Instructions    Discharge Instructions     Ambulatory referral to Physical Therapy   Complete by: As directed    Call MD for:  difficulty breathing, headache or visual disturbances   Complete by: As directed    Call MD for:  persistant dizziness or light-headedness   Complete by: As directed    Call MD for:  persistant nausea and vomiting   Complete by: As directed    Call MD for:  temperature >100.4   Complete by: As directed    Diet - low sodium heart healthy   Complete by: As directed    Discharge instructions   Complete by: As directed    1) please eat small frequent meals as tolerated-----please maintain adequate hydration... Multivitamin and nutritional supplements strongly advised 2) follow-up with primary care physician or oncologist for repeat CBC and BMP blood work in the next 5 to 6 days   Increase activity slowly   Complete by: As directed          Discharge Medications     Allergies as of 02/21/2022       Reactions   Augmentin [amoxicillin-pot Clavulanate] Diarrhea, Nausea And Vomiting   Doxycycline    Abdominal cramps.   Topamax [topiramate] Nausea And Vomiting        Medication List     TAKE these medications    aspirin EC 81 MG tablet Take 1 tablet (81 mg total) by  mouth daily with breakfast. What changed: when to take this   atorvastatin 20 MG tablet Commonly known as: LIPITOR Take 1 tablet (20 mg total) by mouth daily. What changed: when to take this   Beneprotein Pack Take 1 Scoop (6 g total) by mouth 3 (three) times daily.   bisacodyl 5 MG EC tablet Commonly known as: DULCOLAX Take four tablets at 7 am the day BEFORE surgery   magnesium oxide 400 (240 Mg) MG tablet Commonly known as: MAG-OX Take 1 tablet (400 mg total) by mouth 2 (two) times daily. What changed: when to take this  metoprolol succinate 25 MG 24 hr tablet Commonly known as: TOPROL-XL Take 0.5 tablets (12.5 mg total) by mouth daily. What changed: when to take this   metroNIDAZOLE 500 MG tablet Commonly known as: FLAGYL Take 2 tablets (1000 mg total) at 2 pm, 3 pm, and 10 pm the day BEFORE surgery   mirtazapine 15 MG tablet Commonly known as: Remeron Take 1 tablet (15 mg total) by mouth at bedtime.   polyethylene glycol powder 17 GM/SCOOP powder Commonly known as: MiraLax Mix entire bottle with 64 ounces of gatorade (G2 or powerade zero). Begin drinking at 10 am the day BEFORE surgery and finish in 4 hours.   Potassium 99 MG Tabs Take 99 mg by mouth at bedtime.   traMADol 50 MG tablet Commonly known as: ULTRAM Take 1 tablet (50 mg total) by mouth every 6 (six) hours as needed for severe pain. For AFTER surgery, do not take and drive        Major procedures and Radiology Reports - PLEASE review detailed and final reports for all details, in brief -   ECHOCARDIOGRAM COMPLETE  Result Date: 02/17/2022    ECHOCARDIOGRAM REPORT   Patient Name:   ARTHELLA HEADINGS Date of Exam: 02/17/2022 Medical Rec #:  [id number]   Height:       62.0 in Accession #:    [id number]  Weight:       189.4 lb Date of Birth:  [birth year]    BSA:          1.868 m Patient Age:    56 years    BP:           88/58 mmHg Patient Gender: F           HR:           83 bpm. Exam Location:  [facility]  Procedure: 2D Echo, Cardiac Doppler and Color Doppler Indications:    Syncope  History:        Patient has prior history of Echocardiogram examinations, most                 recent 09/17/2016. Signs/Symptoms:Dyspnea and Syncope; Risk                 Factors:Hypertension and Dyslipidemia.  Sonographer:    Wenda Low Referring Phys: [id number] ASIA B [name]  1. Left ventricular ejection fraction, by estimation, is 60 to 65%. The left ventricle has normal function. The left ventricle has no regional wall motion abnormalities. Left ventricular diastolic parameters were normal.  2. Right ventricular systolic function is normal. The right ventricular size is mildly enlarged. There is mildly elevated pulmonary artery systolic pressure. The estimated right ventricular systolic pressure is 44.0 mmHg.  3. The mitral valve is grossly normal. Trivial mitral valve regurgitation.  4. The aortic valve is tricuspid. Aortic valve regurgitation is trivial. Aortic valve sclerosis is present, with no evidence of aortic valve stenosis. Aortic valve mean gradient measures 5.0 mmHg.  5. The inferior vena cava is normal in size with <50% respiratory variability, suggesting right atrial pressure of 8 mmHg. Comparison(s): Prior images unable to be directly viewed, comparison made by report only. FINDINGS  Left Ventricle: Left ventricular ejection fraction, by estimation, is 60 to 65%. The left ventricle has normal function. The left ventricle has no regional wall motion abnormalities. The left ventricular internal cavity size was normal in size. There is  no left ventricular hypertrophy. Left ventricular diastolic parameters were normal. Right Ventricle: The  right ventricular size is mildly enlarged. No increase in right ventricular wall thickness. Right ventricular systolic function is normal. There is mildly elevated pulmonary artery systolic pressure. The tricuspid regurgitant velocity is 2.73 m/s, and with an assumed  right atrial pressure of 8 mmHg, the estimated right ventricular systolic pressure is 88.4 mmHg. Left Atrium: Left atrial size was normal in size. Right Atrium: Right atrial size was normal in size. Pericardium: Trivial pericardial effusion is present. The pericardial effusion is posterior to the left ventricle. Presence of epicardial fat layer. Mitral Valve: The mitral valve is grossly normal. Trivial mitral valve regurgitation. MV peak gradient, 7.1 mmHg. The mean mitral valve gradient is 3.0 mmHg. Tricuspid Valve: The tricuspid valve is grossly normal. Tricuspid valve regurgitation is mild. Aortic Valve: The aortic valve is tricuspid. There is mild aortic valve annular calcification. Aortic valve regurgitation is trivial. Aortic valve sclerosis is present, with no evidence of aortic valve stenosis. Aortic valve mean gradient measures 5.0 mmHg. Aortic valve peak gradient measures 12.8 mmHg. Aortic valve area, by VTI measures 2.16 cm. Pulmonic Valve: The pulmonic valve was grossly normal. Pulmonic valve regurgitation is trivial. Aorta: The aortic root is normal in size and structure. Venous: The inferior vena cava is normal in size with less than 50% respiratory variability, suggesting right atrial pressure of 8 mmHg. IAS/Shunts: No atrial level shunt detected by color flow Doppler.  LEFT VENTRICLE PLAX 2D LVIDd:         4.60 cm   Diastology LVIDs:         2.70 cm   LV e' medial:    9.46 cm/s LV PW:         0.80 cm   LV E/e' medial:  12.4 LV IVS:        0.90 cm   LV e' lateral:   9.57 cm/s LVOT diam:     2.00 cm   LV E/e' lateral: 12.2 LV SV:         72 LV SV Index:   39 LVOT Area:     3.14 cm  RIGHT VENTRICLE RV Basal diam:  2.90 cm RV Mid diam:    4.00 cm RV S prime:     10.30 cm/s TAPSE (M-mode): 2.3 cm LEFT ATRIUM             Index        RIGHT ATRIUM           Index LA diam:        3.20 cm 1.71 cm/m   RA Area:     12.70 cm LA Vol (A2C):   38.5 ml 20.61 ml/m  RA Volume:   30.40 ml  16.28 ml/m LA Vol  (A4C):   34.0 ml 18.20 ml/m LA Biplane Vol: 38.7 ml 20.72 ml/m  AORTIC VALVE                     PULMONIC VALVE AV Area (Vmax):    2.05 cm      PV Vmax:       1.02 m/s AV Area (Vmean):   2.24 cm      PV Peak grad:  4.2 mmHg AV Area (VTI):     2.16 cm AV Vmax:           179.00 cm/s AV Vmean:          104.000 cm/s AV VTI:            0.333 m AV Peak Grad:  12.8 mmHg AV Mean Grad:      5.0 mmHg LVOT Vmax:         117.00 cm/s LVOT Vmean:        74.000 cm/s LVOT VTI:          0.229 m LVOT/AV VTI ratio: 0.69  AORTA Ao Root diam: 2.90 cm MITRAL VALVE                TRICUSPID VALVE MV Area (PHT): 4.57 cm     TR Peak grad:   29.8 mmHg MV Area VTI:   2.23 cm     TR Vmax:        273.00 cm/s MV Peak grad:  7.1 mmHg MV Mean grad:  3.0 mmHg     SHUNTS MV Vmax:       1.33 m/s     Systemic VTI:  0.23 m MV Vmean:      74.9 cm/s    Systemic Diam: 2.00 cm MV Decel Time: 166 msec MV E velocity: 117.00 cm/s MV A velocity: 79.70 cm/s MV E/A ratio:  1.47 Rozann Lesches MD Electronically signed by Rozann Lesches MD Signature Date/Time: 02/17/2022/1:38:30 PM    Final    DG Chest Port 1 View  Result Date: 02/16/2022 CLINICAL DATA:  Possible sepsis, unwitnessed fall. EXAM: PORTABLE CHEST 1 VIEW COMPARISON:  11/15/2021. FINDINGS: The heart size and mediastinal contours are within normal limits. No consolidation, effusion, or pneumothorax. Old healed rib fractures are noted bilaterally. A left chest port terminates over the junction of the brachiocephalic vein and superior vena cava, slightly retracted as compared with the prior exam. IMPRESSION: No active disease. Electronically Signed   By: Brett Fairy M.D.   On: 02/16/2022 23:40    Micro Results  Recent Results (from the past 240 hour(s))  Blood Culture (routine x 2)     Status: None   Collection Time: 02/16/22 11:05 PM   Specimen: Site Not Specified; Blood  Result Value Ref Range Status   Specimen Description   Final    SITE NOT SPECIFIED BOTTLES DRAWN AEROBIC AND  ANAEROBIC   Special Requests Blood Culture adequate volume  Final   Culture   Final    NO GROWTH 5 DAYS Performed at [facility], [address] [place], [place] [address]    Report Status 02/21/2022 FINAL  Final  Blood Culture (routine x 2)     Status: None   Collection Time: 02/16/22 11:10 PM   Specimen: BLOOD RIGHT FOREARM  Result Value Ref Range Status   Specimen Description   Final    BLOOD RIGHT FOREARM BOTTLES DRAWN AEROBIC AND ANAEROBIC   Special Requests Blood Culture adequate volume  Final   Culture   Final    NO GROWTH 5 DAYS Performed at [facility], [address], [place], [place] [address]    Report Status 02/21/2022 FINAL  Final  Urine Culture     Status: None   Collection Time: 02/18/22  3:57 PM   Specimen: Urine, Clean Catch  Result Value Ref Range Status   Specimen Description   Final    URINE, CLEAN CATCH Performed at [facility], [address] [place], [place] [address]    Special Requests   Final    NONE Performed at [facility], [address], [place], [place] [address]    Culture   Final    NO GROWTH Performed at [facility], [place] [address], [place], [place] [address]  Report Status 02/20/2022 FINAL  Final    Today   Subjective    Fortunata Boschee today has no chest pains palpitations or dizziness, no dyspnea on exertion-   = Husband at bedside, questions answered -Oral intake improving, no emesis  -nausea improving -Feels better after transfusion -No bleeding concerns -No additional new concerns  Patient has been seen and examined prior to discharge   Objective   Blood pressure (!) 143/94, pulse (!) 59, temperature 97.8 F (36.6 C), temperature source Oral, resp. rate 16, height '5\' 2"'$  (1.575 m), weight 89.6 kg, SpO2 100 %.   Intake/Output Summary (Last 24 hours) at 02/21/2022 1652 Last data filed at 02/21/2022 1300 Gross per 24 hour  Intake 660 ml  Output 1250 ml  Net -590 ml    Exam Gen:- Awake Alert, no acute distress   HEENT:- [place].AT, No sclera icterus Neck-Supple Neck,No JVD,.  Lungs-  CTAB , good air movement bilaterally CV- S1, S2 normal, regular, Port-A-Cath in situ Abd-  +ve B.Sounds, Abd Soft, No tenderness,    Extremity/Skin:- No  edema,   good pulses Psych-affect is appropriate, oriented x3 Neuro-generalized, no new focal deficits, no tremors    Data Review   CBC w Diff:  Lab Results  Component Value Date   WBC 3.4 (L) 02/21/2022   HGB 10.8 (L) 02/21/2022   HGB 9.0 (L) 02/08/2022   HCT 33.0 (L) 02/21/2022   HCT 27.0 (L) 02/08/2022   PLT 144 (L) 02/21/2022   PLT 79 (LL) 02/08/2022   LYMPHOPCT 17 02/18/2022   BANDSPCT 7 02/18/2022   MONOPCT 5 02/18/2022   EOSPCT 0 02/18/2022   BASOPCT 0 02/18/2022    CMP:  Lab Results  Component Value Date   NA 137 02/21/2022   NA 139 02/08/2022   K 4.6 02/21/2022   CL 107 02/21/2022   CO2 25 02/21/2022   BUN <5 (L) 02/21/2022   BUN 4 (L) 02/08/2022   CREATININE 0.57 02/21/2022   PROT 5.2 (L) 02/17/2022   PROT 6.5 02/08/2022   ALBUMIN 2.4 (L) 02/17/2022   ALBUMIN 4.0 02/08/2022   BILITOT 1.2 02/17/2022   BILITOT 0.8 02/08/2022   ALKPHOS 42 02/17/2022   AST 27 02/17/2022   ALT 22 02/17/2022  .  Total Discharge time is about 33 minutes  Roxan Hockey M.D on 02/21/2022 at 4:52 PM  Go to [email or web address] -  for contact info  [facility] - Office  [phone]

## 2022-02-21 NOTE — Progress Notes (Signed)
Physical Therapy Treatment Patient Details Name: Nicole Santos MRN: [id number] DOB: Jul 05, 1970 Today's Date: 02/21/2022   History of Present Illness Nicole Santos is a 51 y.o. female with medical history significant of anxiety, cancer, depression, history of diabetes mellitus type 2, hypertension, and more presents ED with a chief complaint of loss of consciousness.  Patient reports that she got up to go to the bathroom and passed out.  She was only a couple of steps into her journey to the bathroom when she woke up on the floor.  She reports she had no preceding symptoms.  She did hit her head because she "busted her lip."  She reports she was immediately oriented on waking.  She had no change in vision, no asymmetric weakness, no difficulty speaking or swallowing.  Patient denies any chest pain, shortness of breath, headache, dizziness.  Patient reports that she has had poor p.o. intake because her Neulasta makes everything tastes bad.  As soon as the effect of that starts to wear off its time for her to get a new shot of patient reports she has had decreased urine output and darker urine at home.  She has had nausea and vomiting x1.  Patient has no other complaints at this time.    PT Comments    Excellent progression with functional mobility. Mod-I to supervision level with all tasks including ambulating with RW up to 160 feet today without LOB while using device. HR 84 at rest, but did elevated to 151bpm while ambulating - asymptomatic, no DOE, denies dizziness. Updated Recs for outpatient PT for conditioning when patient feels ready, declines SNF and HHPT and feels she has plenty of support at home as needed. Will continue to follow and progress during admission but is adequate for d/c from mobility standpoint.   Recommendations for follow up therapy are one component of a multi-disciplinary discharge planning process, led by the attending physician.  Recommendations may be updated based on patient  status, additional functional criteria and insurance authorization.  Follow Up Recommendations  Outpatient PT     Assistance Recommended at Discharge PRN  Patient can return home with the following Assistance with cooking/housework;Help with stairs or ramp for entrance;Assist for transportation   Equipment Recommendations  None recommended by PT    Recommendations for Other Services       Precautions / Restrictions Precautions Precautions: Fall Restrictions Weight Bearing Restrictions: No     Mobility  Bed Mobility Overal bed mobility: Needs Assistance, Modified Independent             General bed mobility comments: mod I    Transfers Overall transfer level: Needs assistance Equipment used: Rolling walker (2 wheels) Transfers: Sit to/from Stand, Bed to chair/wheelchair/BSC Sit to Stand: Supervision           General transfer comment: Supervision for safety, no overt instability, but uses RW for support upon standing today.    Ambulation/Gait Ambulation/Gait assistance: Supervision Gait Distance (Feet): 160 Feet Assistive device: Rolling walker (2 wheels) Gait Pattern/deviations: Decreased step length - right, Decreased step length - left, Decreased stride length Gait velocity: decreased     General Gait Details: Decreased gait speed but demonstrates good control of AD with RW this date. No overt instability noted. Supervision for safety. HR did elevate to 151bpm, denies dizziness or other symptoms.   Stairs             Wheelchair Mobility    Modified Rankin (Stroke Patients Only)  Balance Overall balance assessment: Needs assistance Sitting-balance support: Feet supported, No upper extremity supported Sitting balance-Leahy Scale: Good Sitting balance - Comments: seated at EOB   Standing balance support: During functional activity, No upper extremity supported Standing balance-Leahy Scale: Fair Standing balance comment: fair/poor  without AD, goodusing RW                            Cognition Arousal/Alertness: Awake/alert Behavior During Therapy: WFL for tasks assessed/performed Overall Cognitive Status: Within Functional Limits for tasks assessed                                          Exercises      General Comments General comments (skin integrity, edema, etc.): HR 84 at rest, up to 151 bpm after walking - returns to baseline quickly upon sitting. Asymptomatic, no DOE.      Pertinent Vitals/Pain Pain Assessment Pain Assessment: No/denies pain    Home Living                          Prior Function            PT Goals (current goals can now be found in the care plan section) Acute Rehab PT Goals Patient Stated Goal: return home PT Goal Formulation: With patient Time For Goal Achievement: 03/03/22 Potential to Achieve Goals: Good Progress towards PT goals: Progressing toward goals    Frequency    Min 3X/week      PT Plan Current plan remains appropriate    Co-evaluation              AM-PAC PT "6 Clicks" Mobility   Outcome Measure  Help needed turning from your back to your side while in a flat bed without using bedrails?: None Help needed moving from lying on your back to sitting on the side of a flat bed without using bedrails?: None Help needed moving to and from a bed to a chair (including a wheelchair)?: None Help needed standing up from a chair using your arms (e.g., wheelchair or bedside chair)?: None Help needed to walk in hospital room?: A Little Help needed climbing 3-5 steps with a railing? : A Lot 6 Click Score: 21    End of Session Equipment Utilized During Treatment: Gait belt Activity Tolerance: Patient tolerated treatment well Patient left: with call bell/phone within reach;in bed;with family/visitor present;with nursing/sitter in room   PT Visit Diagnosis: Unsteadiness on feet (R26.81);Other abnormalities of gait and  mobility (R26.89);Muscle weakness (generalized) (M62.81)     Time: 2831-5176 PT Time Calculation (min) (ACUTE ONLY): 14 min  Charges:  $Gait Training: 8-22 mins                     Candie Mile, PT, DPT Physical Therapist Acute Rehabilitation Services [facility]   02/21/2022, 11:27 AM

## 2022-02-21 NOTE — Progress Notes (Signed)
Port heparin locked and deaccessed.  No bleeding at site.

## 2022-02-22 ENCOUNTER — Telehealth: Payer: Self-pay

## 2022-02-22 ENCOUNTER — Encounter: Payer: Self-pay | Admitting: Family Medicine

## 2022-02-22 NOTE — Telephone Encounter (Signed)
Transition Care Management Follow-up Telephone Call Date of discharge and from where: 02/21/22  How have you been since you were released from the hospital? [place]  Any questions or concerns? No  Items Reviewed: Did the pt receive and understand the discharge instructions provided? Yes  Medications obtained and verified? Yes  Other? Patient states that BP was running low and is wondering if that could be the medication that was given to lower her heart rate Any new allergies since your discharge? No  Dietary orders reviewed? No Do you have support at home? Yes   Home Care and Equipment/Supplies: Were home health services ordered? Patient declined because she has people at home everyday that can provide for her needs.  If so, what is the name of the agency? N/A  Has the agency set up a time to come to the patient's home? not applicable Were any new equipment or medical supplies ordered?  No. Patient states that she has wheelchair, portable potty chair and walker at home  What is the name of the medical supply agency? N/A Were you able to get the supplies/equipment? not applicable Do you have any questions related to the use of the equipment or supplies? No  Functional Questionnaire: (I = Independent and D = Dependent) ADLs: I - patient has help from husband and daughter as needed.   Bathing/Dressing- I patient has shower chair  Meal Prep- D  Eating- I  Maintaining continence- I  Transferring/Ambulation- I - patient is able to transfer and ambulation is independent with walker as needed  Managing Meds- D  Follow up appointments reviewed:  PCP Hospital f/u appt confirmed? Yes  Scheduled to see Dettinger on 03/01/22 @ 1150am . [facility] f/u appt confirmed?  Patient plans on following up with oncology if needed - her provider is currently out of the office at this time - patient made follow up with [place]    Are transportation arrangements needed? No  If their condition  worsens, is the pt aware to call PCP or go to the Emergency Dept.? Yes Was the patient provided with contact information for the PCP's office or ED? Yes Was to pt encouraged to call back with questions or concerns? Yes

## 2022-02-23 ENCOUNTER — Ambulatory Visit (INDEPENDENT_AMBULATORY_CARE_PROVIDER_SITE_OTHER): Payer: Commercial Managed Care - PPO | Admitting: Family

## 2022-02-23 ENCOUNTER — Encounter: Payer: Self-pay | Admitting: Family

## 2022-02-23 DIAGNOSIS — R399 Unspecified symptoms and signs involving the genitourinary system: Secondary | ICD-10-CM | POA: Diagnosis not present

## 2022-02-23 MED ORDER — CEPHALEXIN 500 MG PO CAPS: 500.0000 mg | ORAL_CAPSULE | Freq: Two times a day (BID) | ORAL | 0 refills | Status: DC

## 2022-02-23 NOTE — Progress Notes (Signed)
Virtual Visit  Note Due to COVID-19 pandemic this visit was conducted virtually. This visit type was conducted due to national recommendations for restrictions regarding the COVID-19 Pandemic (e.g. social distancing, sheltering in place) in an effort to limit this patient's exposure and mitigate transmission in our community. All issues noted in this document were discussed and addressed.  A physical exam was not performed with this format.  I connected with Nicole Santos on 02/23/22 at 1:45 pm  by telephone and verified that I am speaking with the correct person using two identifiers. Nicole Santos is currently located at home and no one is currently with her during visit. The provider, Evelina Dun, FNP is located in their office at time of visit.  I discussed the limitations, risks, security and privacy concerns of performing an evaluation and management service by telephone and the availability of in person appointments. I also discussed with the patient that there may be a patient responsible charge related to this service. The patient expressed understanding and agreed to proceed.  Nicole Santos, Nicole Santos are scheduled for a virtual visit with your provider today.    Just as we do with appointments in the office, we must obtain your consent to participate.  Your consent will be active for this visit and any virtual visit you may have with one of our providers in the next 365 days.    If you have a MyChart account, I can also send a copy of this consent to you electronically.  All virtual visits are billed to your insurance company just like a traditional visit in the office.  As this is a virtual visit, video technology does not allow for your provider to perform a traditional examination.  This may limit your provider's ability to fully assess your condition.  If your provider identifies any concerns that need to be evaluated in person or the need to arrange testing such as labs, EKG, etc, we will make  arrangements to do so.    Although advances in technology are sophisticated, we cannot ensure that it will always work on either your end or our end.  If the connection with a video visit is poor, we may have to switch to a telephone visit.  With either a video or telephone visit, we are not always able to ensure that we have a secure connection.   I need to obtain your verbal consent now.   Are you willing to proceed with your visit today?   Nicole Santos has provided verbal consent on 02/23/2022 for a virtual visit (video or telephone).   Evelina Dun, [place] 02/23/2022  2:02 PM    History and Present Illness:  Dysuria  This is a new problem. The current episode started 1 to 4 weeks ago. The problem occurs intermittently. The problem has been waxing and waning. The quality of the pain is described as burning. The pain is at a severity of 5/10. The pain is mild. Associated symptoms include frequency, hesitancy and urgency. Pertinent negatives include no hematuria, nausea, sweats or vomiting. She has tried acetaminophen and increased fluids for the symptoms. The treatment provided mild relief.      Review of Systems  Gastrointestinal:  Negative for nausea and vomiting.  Genitourinary:  Positive for dysuria, frequency, hesitancy and urgency. Negative for hematuria.  All other systems reviewed and are negative.    Observations/Objective: No SOB or distress noted  Assessment and Plan: 1. UTI symptoms Force fluids AZO over the counter  X2 days RTO prn Culture pending Follow up if symptoms worsen or do not improve  - Urinalysis, Complete; Future - Urine Culture; Future - cephALEXin (KEFLEX) 500 MG capsule; Take 1 capsule (500 mg total) by mouth 2 (two) times daily.  Dispense: 14 capsule; Refill: 0     I discussed the assessment and treatment plan with the patient. The patient was provided an opportunity to ask questions and all were answered. The patient agreed with the plan and  demonstrated an understanding of the instructions.   The patient was advised to call back or seek an in-person evaluation if the symptoms worsen or if the condition fails to improve as anticipated.  The above assessment and management plan was discussed with the patient. The patient verbalized understanding of and has agreed to the management plan. Patient is aware to call the clinic if symptoms persist or worsen. Patient is aware when to return to the clinic for a follow-up visit. Patient educated on when it is appropriate to go to the emergency department.   Time call ended:  1:57 pm   I provided 12 minutes of  non face-to-face time during this encounter.    Evelina Dun, FNP

## 2022-02-23 NOTE — Patient Instructions (Signed)
Urinary Tract Infection, Adult  A urinary tract infection (UTI) is an infection of any part of the urinary tract. The urinary tract includes the kidneys, ureters, bladder, and urethra. These organs make, store, and get rid of urine in the body. An upper UTI affects the ureters and kidneys. A lower UTI affects the bladder and urethra. What are the causes? Most urinary tract infections are caused by bacteria in your genital area around your urethra, where urine leaves your body. These bacteria grow and cause inflammation of your urinary tract. What increases the risk? You are more likely to develop this condition if: You have a urinary catheter that stays in place. You are not able to control when you urinate or have a bowel movement (incontinence). You are female and you: Use a spermicide or diaphragm for birth control. Have low estrogen levels. Are pregnant. You have certain genes that increase your risk. You are sexually active. You take antibiotic medicines. You have a condition that causes your flow of urine to slow down, such as: An enlarged prostate, if you are female. Blockage in your urethra. A kidney stone. A nerve condition that affects your bladder control (neurogenic bladder). Not getting enough to drink, or not urinating often. You have certain medical conditions, such as: Diabetes. A weak disease-fighting system (immunesystem). Sickle cell disease. Gout. Spinal cord injury. What are the signs or symptoms? Symptoms of this condition include: Needing to urinate right away (urgency). Frequent urination. This may include small amounts of urine each time you urinate. Pain or burning with urination. Blood in the urine. Urine that smells bad or unusual. Trouble urinating. Cloudy urine. Vaginal discharge, if you are female. Pain in the abdomen or the lower back. You may also have: Vomiting or a decreased appetite. Confusion. Irritability or tiredness. A fever or  chills. Diarrhea. The first symptom in older adults may be confusion. In some cases, they may not have any symptoms until the infection has worsened. How is this diagnosed? This condition is diagnosed based on your medical history and a physical exam. You may also have other tests, including: Urine tests. Blood tests. Tests for STIs (sexually transmitted infections). If you have had more than one UTI, a cystoscopy or imaging studies may be done to determine the cause of the infections. How is this treated? Treatment for this condition includes: Antibiotic medicine. Over-the-counter medicines to treat discomfort. Drinking enough water to stay hydrated. If you have frequent infections or have other conditions such as a kidney stone, you may need to see a health care provider who specializes in the urinary tract (urologist). In rare cases, urinary tract infections can cause sepsis. Sepsis is a life-threatening condition that occurs when the body responds to an infection. Sepsis is treated in the hospital with IV antibiotics, fluids, and other medicines. Follow these instructions at home:  Medicines Take over-the-counter and prescription medicines only as told by your health care provider. If you were prescribed an antibiotic medicine, take it as told by your health care provider. Do not stop using the antibiotic even if you start to feel better. General instructions Make sure you: Empty your bladder often and completely. Do not hold urine for long periods of time. Empty your bladder after sex. Wipe from front to back after urinating or having a bowel movement if you are female. Use each tissue only one time when you wipe. Drink enough fluid to keep your urine pale yellow. Keep all follow-up visits. This is important. Contact a health   care provider if: Your symptoms do not get better after 1-2 days. Your symptoms go away and then return. Get help right away if: You have severe pain in  your back or your lower abdomen. You have a fever or chills. You have nausea or vomiting. Summary A urinary tract infection (UTI) is an infection of any part of the urinary tract, which includes the kidneys, ureters, bladder, and urethra. Most urinary tract infections are caused by bacteria in your genital area. Treatment for this condition often includes antibiotic medicines. If you were prescribed an antibiotic medicine, take it as told by your health care provider. Do not stop using the antibiotic even if you start to feel better. Keep all follow-up visits. This is important. This information is not intended to replace advice given to you by your health care provider. Make sure you discuss any questions you have with your health care provider. Document Revised: 01/01/2020 Document Reviewed: 01/01/2020 Elsevier Patient Education  2023 Elsevier Inc.  

## 2022-02-24 LAB — TYPE AND SCREEN
ABO/RH(D): B NEG
Antibody Screen: NEGATIVE
Unit division: 0
Unit division: 0
Unit division: 0

## 2022-02-24 LAB — BPAM RBC
Blood Product Expiration Date: 202310192359
Blood Product Expiration Date: 202310212359
Blood Product Expiration Date: 202310242359
ISSUE DATE / TIME: 202309190855
ISSUE DATE / TIME: 202309191302
Unit Type and Rh: 1700
Unit Type and Rh: 9500
Unit Type and Rh: 9500

## 2022-02-26 NOTE — Telephone Encounter (Signed)
Pt called and all discussed  Will sch next f/u with Dr Darnell Level    Seeing Dr Dettinger on Thursday for [facility] follow up

## 2022-02-26 NOTE — Telephone Encounter (Signed)
It looks like from reviewing her chart that her blood pressure was low because she was dehydrated. Metoprolol does more her heart rate. Labs will be fine to complete at her [facility] appointment. Dr. Lajuana Ripple was agreeable in taking her on as a patient.

## 2022-02-28 ENCOUNTER — Telehealth: Payer: Commercial Managed Care - PPO | Admitting: Gynecologic Oncology

## 2022-02-28 ENCOUNTER — Inpatient Hospital Stay (HOSPITAL_BASED_OUTPATIENT_CLINIC_OR_DEPARTMENT_OTHER): Payer: Commercial Managed Care - PPO | Admitting: Gynecologic Oncology

## 2022-02-28 DIAGNOSIS — C800 Disseminated malignant neoplasm, unspecified: Secondary | ICD-10-CM

## 2022-02-28 DIAGNOSIS — C579 Malignant neoplasm of female genital organ, unspecified: Secondary | ICD-10-CM | POA: Diagnosis not present

## 2022-02-28 NOTE — Progress Notes (Signed)
Gynecologic Oncology Telehealth Note: Gyn-Onc  I connected with Nicole Santos on 02/28/22 at  4:00 PM EDT by telephone and verified that I am speaking with the correct person using two identifiers.  I discussed the limitations, risks, security and privacy concerns of performing an evaluation and management service by telemedicine and the availability of in-person appointments. I also discussed with the patient that there may be a patient responsible charge related to this service. The patient expressed understanding and agreed to proceed.  Other persons participating in the visit and their role in the encounter: none.  Patient's location: home Provider's location: [facility]  Reason for Visit: follow-up after recent surgery.  Treatment History: Oncology History  Primary peritoneal carcinomatosis ([place])  11/17/2021 Initial Diagnosis   Primary peritoneal carcinomatosis ([place])   11/21/2021 - 01/25/2022 Chemotherapy   Patient is on Treatment Plan : OVARIAN Carboplatin (AUC 6) / Paclitaxel (175) q21d x 6 cycles     11/21/2021 -  Chemotherapy   Patient is on Treatment Plan : OVARIAN Carboplatin (AUC 6) + Paclitaxel (175) q21d X 6 Cycles       Interval History: Feels weaker than when she went into the hospital. Taste still off. Can't do anything sweet. Weight loss has stopped last few months. Son found powder online - has protein and calories (unflavored). Not having lot of bowel function but had some yesterday.  Urinating normally. Taking antibiotics for a urine infection, still sometimes has some dysuria. Checking bp at home. Was 126/77.  Past Medical/Surgical History: Past Medical History:  Diagnosis Date   Acute respiratory disease due to COVID-19 virus 02/02/2019   Anxiety    Cancer ([place])    Depression    Dyspnea    Dysrhythmia    History of diabetes mellitus    Resolved in Sept 2023 with weight loss.   Hypertension    Ovarian cancer ([place]) 11/14/2021   Peritoneal carcinomatosis ([place])     Pneumonia    Sleep apnea    doesn't use CPAP   Tachycardia     Past Surgical History:  Procedure Laterality Date   KNEE SURGERY Right    Arthroscopic   LAPAROSCOPIC ABDOMINAL EXPLORATION N/A 11/13/2021   Procedure: Exploratory laparoscopy;  Surgeon: Aviva Signs, MD;  Location: [facility];  Service: General;  Laterality: N/A;   PORTACATH PLACEMENT Left 11/15/2021   Procedure: INSERTION PORT-A-CATH;  Surgeon: Aviva Signs, MD;  Location: [facility];  Service: General;  Laterality: Left;   TONSILLECTOMY AND ADENOIDECTOMY      Family History  Problem Relation Age of Onset   Hypertension Mother    Bladder Cancer Maternal Grandmother    Breast cancer Maternal Great-grandmother    Colon cancer Neg Hx    Ovarian cancer Neg Hx    Endometrial cancer Neg Hx    Pancreatic cancer Neg Hx    Prostate cancer Neg Hx     Social History   Socioeconomic History   Marital status: Married    Spouse name: Not on file   Number of children: Not on file   Years of education: Not on file   Highest education level: Not on file  Occupational History   Occupation: Unemployed  Tobacco Use   Smoking status: Never   Smokeless tobacco: Never  Vaping Use   Vaping Use: Never used  Substance and Sexual Activity   Alcohol use: No   Drug use: No   Sexual activity: Not Currently  Other Topics Concern   Not on file  Social  History Narrative   Not on file   Social Determinants of Health   Financial Resource Strain: Not on file  Food Insecurity: No Food Insecurity (02/17/2022)   Hunger Vital Sign    Worried About Running Out of Food in the Last Year: Never true    Ran Out of Food in the Last Year: Never true  Transportation Needs: No Transportation Needs (02/17/2022)   PRAPARE - Hydrologist (Medical): No    Lack of Transportation (Non-Medical): No  Physical Activity: Not on file  Stress: Not on file  Social Connections: Not on file    Current Medications:  Current  Outpatient Medications:    aspirin EC 81 MG tablet, Take 1 tablet (81 mg total) by mouth daily with breakfast., Disp: 30 tablet, Rfl: 3   atorvastatin (LIPITOR) 20 MG tablet, Take 1 tablet (20 mg total) by mouth daily. (Patient taking differently: Take 20 mg by mouth at bedtime.), Disp: 90 tablet, Rfl: 1   Beneprotein PACK, Take 1 Scoop (6 g total) by mouth 3 (three) times daily. (Patient not taking: Reported on 02/13/2022), Disp: 90 each, Rfl: 2   cephALEXin (KEFLEX) 500 MG capsule, Take 1 capsule (500 mg total) by mouth 2 (two) times daily., Disp: 14 capsule, Rfl: 0   magnesium oxide (MAG-OX) 400 (240 Mg) MG tablet, Take 1 tablet (400 mg total) by mouth 2 (two) times daily. (Patient taking differently: Take 400 mg by mouth at bedtime.), Disp: 60 tablet, Rfl: 2   metoprolol succinate (TOPROL-XL) 25 MG 24 hr tablet, Take 0.5 tablets (12.5 mg total) by mouth daily. (Patient taking differently: Take 12.5 mg by mouth at bedtime.), Disp: 15 tablet, Rfl: 2   mirtazapine (REMERON) 15 MG tablet, Take 1 tablet (15 mg total) by mouth at bedtime., Disp: 30 tablet, Rfl: 3   Potassium 99 MG TABS, Take 99 mg by mouth at bedtime., Disp: , Rfl:   Review of Symptoms: Pertinent positives as per HPI.  Physical Exam: Deferred given limitations of phone visit.  Laboratory & Radiologic Studies: None new  Assessment & Plan: Nicole Santos is a 51 y.o. woman with stage IVA gynecologic malignancy, presumed to be primary peritoneal versus uterine.  Patient has been scheduled for interval debulking surgery in the stage of metastatic GYN malignancy.  This was canceled due to recent admission.  Today, the patient endorses continuing to feel quite weak.  Her p.o. intake is very poor, related to issues with taste and smell.  She has not met with a dietitian, and I recommended that we pursue having her speak with one of our dietitians to see if there are any other products that we could use to help increase her calorie and  protein intake.  I would not recommend initiating TPN.  Her gut is functional and so she and I discussed that if she is not able to increase her oral intake, I would pursue PEG placement for enteral feeding.  I am concerned regarding her strength and fitness for surgery.  I do not think she is a candidate for surgery at this time.  We could delay another 2-3 weeks and revisit whether she is a candidate for surgery.  I am also concerned, it may be difficult to get her back on chemotherapy.  Her medical oncologist was out of town last week.  I will reach out to him regarding plan to reevaluate her in person in the next week or 2 to see what her fitness  is for either surgery or reinitiation of chemotherapy.  I discussed the assessment and treatment plan with the patient. The patient was provided with an opportunity to ask questions and all were answered. The patient agreed with the plan and demonstrated an understanding of the instructions.   The patient was advised to call back or see an in-person evaluation if the symptoms worsen or if the condition fails to improve as anticipated.   18 minutes of total time was spent for this patient encounter, including preparation, phone counseling with the patient and coordination of care, and documentation of the encounter.   Jeral Pinch, MD  Division of Gynecologic Oncology  Department of Obstetrics and Gynecology  [facility]

## 2022-03-01 ENCOUNTER — Encounter: Payer: Self-pay | Admitting: Family Medicine

## 2022-03-01 ENCOUNTER — Other Ambulatory Visit: Payer: Self-pay | Admitting: Gynecologic Oncology

## 2022-03-01 ENCOUNTER — Telehealth: Payer: Self-pay | Admitting: Family Medicine

## 2022-03-01 ENCOUNTER — Ambulatory Visit: Payer: Commercial Managed Care - PPO | Admitting: Family Medicine

## 2022-03-01 VITALS — BP 110/68 | HR 110 | Temp 97.0°F | Ht 62.0 in | Wt 177.0 lb

## 2022-03-01 DIAGNOSIS — D649 Anemia, unspecified: Secondary | ICD-10-CM | POA: Diagnosis not present

## 2022-03-01 DIAGNOSIS — E860 Dehydration: Secondary | ICD-10-CM | POA: Diagnosis not present

## 2022-03-01 DIAGNOSIS — R55 Syncope and collapse: Secondary | ICD-10-CM

## 2022-03-01 DIAGNOSIS — R638 Other symptoms and signs concerning food and fluid intake: Secondary | ICD-10-CM

## 2022-03-01 DIAGNOSIS — C579 Malignant neoplasm of female genital organ, unspecified: Secondary | ICD-10-CM

## 2022-03-01 MED ORDER — LEMON-GLYCERIN MT SWAB: 1.0000 | Freq: Every day | OROMUCOSAL | 3 refills | Status: DC

## 2022-03-01 NOTE — Patient Instructions (Signed)
Try using Biotene mouthwash and lemon swabs

## 2022-03-01 NOTE — Telephone Encounter (Signed)
Is she transferring to you?

## 2022-03-01 NOTE — Progress Notes (Signed)
BP 110/68   Pulse (!) 110   Temp (!) 97 F (36.1 C)   Ht _0  (1.575 m)   Wt 177 lb (80.3 kg)   SpO2 100%   BMI 32.37 kg/m    Subjective:   Patient ID: Nicole Santos, female    DOB: [birth year], 51 y.o.   MRN: [id number]  HPI: Nicole Santos is a 51 y.o. female presenting on 03/01/2022 for Hospitalization Follow-up   HPI Patient was in Hospital for syncope. She was going ot restroom and sat down and she went down she came back to it quickly. She got fluids over 5 days. She is still on antibiotic for uti. She received 2 units of transfusion blood for blood counts. Her Hb dipped down to 6.6 and then came up to 10.8. she has not had appetite since chemo in June and tries to keep fluids up.   Relevant past medical, surgical, family and social history reviewed and updated as indicated. Interim medical history since our last visit reviewed. Allergies and medications reviewed and updated.  Review of Systems  Constitutional:  Negative for chills and fever.  Eyes:  Negative for visual disturbance.  Respiratory:  Negative for chest tightness and shortness of breath.   Cardiovascular:  Negative for chest pain and leg swelling.  Musculoskeletal:  Negative for back pain and gait problem.  Skin:  Negative for rash.  Neurological:  Positive for light-headedness. Negative for dizziness and headaches.  Psychiatric/Behavioral:  Negative for agitation and behavioral problems.   All other systems reviewed and are negative.   Per HPI unless specifically indicated above   Allergies as of 03/01/2022       Reactions   Augmentin [amoxicillin-pot Clavulanate] Diarrhea, Nausea And Vomiting   Doxycycline    Abdominal cramps.   Topamax [topiramate] Nausea And Vomiting        Medication List        Accurate as of March 01, 2022 12:31 PM. If you have any questions, ask your nurse or doctor.          aspirin EC 81 MG tablet Take 1 tablet (81 mg total) by mouth daily with breakfast.    atorvastatin 20 MG tablet Commonly known as: LIPITOR Take 1 tablet (20 mg total) by mouth daily. What changed: when to take this   Beneprotein Pack Take 1 Scoop (6 g total) by mouth 3 (three) times daily.   cephALEXin 500 MG capsule Commonly known as: KEFLEX Take 1 capsule (500 mg total) by mouth 2 (two) times daily.   Lemon-Glycerin Swab Use as directed 1 each in the mouth or throat 5 (five) times daily. Started by: Worthy Rancher, MD   magnesium oxide 400 (240 Mg) MG tablet Commonly known as: MAG-OX Take 1 tablet (400 mg total) by mouth 2 (two) times daily. What changed: when to take this   metoprolol succinate 25 MG 24 hr tablet Commonly known as: TOPROL-XL Take 0.5 tablets (12.5 mg total) by mouth daily. What changed: when to take this   mirtazapine 15 MG tablet Commonly known as: Remeron Take 1 tablet (15 mg total) by mouth at bedtime.   Potassium 99 MG Tabs Take 99 mg by mouth at bedtime.         Objective:   BP 110/68   Pulse (!) 110   Temp (!) 97 F (36.1 C)   Ht _1  (1.575 m)   Wt 177 lb (80.3 kg)   SpO2 100%  BMI 32.37 kg/m   Wt Readings from Last 3 Encounters:  03/01/22 177 lb (80.3 kg)  02/20/22 197 lb 8.5 oz (89.6 kg)  02/13/22 183 lb (83 kg)    Physical Exam Vitals and nursing note reviewed.  Constitutional:      General: She is not in acute distress.    Appearance: She is well-developed. She is not diaphoretic.  Eyes:     Conjunctiva/sclera: Conjunctivae normal.  Cardiovascular:     Rate and Rhythm: Normal rate and regular rhythm.     Heart sounds: Normal heart sounds. No murmur heard. Pulmonary:     Effort: Pulmonary effort is normal. No respiratory distress.     Breath sounds: Normal breath sounds. No wheezing.  Musculoskeletal:        General: No swelling or tenderness. Normal range of motion.  Skin:    General: Skin is warm and dry.     Findings: No rash.  Neurological:     Mental Status: She is alert and oriented to  person, place, and time.     Coordination: Coordination normal.  Psychiatric:        Behavior: Behavior normal.       Assessment & Plan:   Problem List Items Addressed This Visit       Other   Syncope   Relevant Medications   Misc. Throat Products (LEMON-GLYCERIN) SWAB   Other Relevant Orders   CMP14+EGFR   Other Visit Diagnoses     Anemia, unspecified type    -  Primary   Relevant Medications   Misc. Throat Products (LEMON-GLYCERIN) SWAB   Other Relevant Orders   CMP14+EGFR   Dehydration       Relevant Medications   Misc. Throat Products (LEMON-GLYCERIN) SWAB   Other Relevant Orders   CMP14+EGFR       Patient has had difficulties with appetite and protein since chemotherapy, recommended glycerin swabs and Biotene mouthwash to see if we can increase saliva for Follow up plan: Return if symptoms worsen or fail to improve, for 1-2 month follow-up on anemia.  Counseling provided for all of the vaccine components Orders Placed This Encounter  Procedures   [place] Madissen Wyse, MD [facility] 03/01/2022, 12:31 PM

## 2022-03-02 ENCOUNTER — Telehealth: Payer: Self-pay | Admitting: Nutrition

## 2022-03-02 ENCOUNTER — Encounter: Payer: Self-pay | Admitting: Hematology

## 2022-03-02 LAB — CMP14+EGFR
ALT: 8 IU/L (ref 0–32)
AST: 21 IU/L (ref 0–40)
Albumin/Globulin Ratio: 1.2 (ref 1.2–2.2)
Albumin: 3.6 g/dL — ABNORMAL LOW (ref 3.8–4.9)
Alkaline Phosphatase: 66 IU/L (ref 44–121)
BUN/Creatinine Ratio: 8 — ABNORMAL LOW (ref 9–23)
BUN: 5 mg/dL — ABNORMAL LOW (ref 6–24)
Bilirubin Total: 1 mg/dL (ref 0.0–1.2)
CO2: 20 mmol/L (ref 20–29)
Calcium: 8.8 mg/dL (ref 8.7–10.2)
Chloride: 93 mmol/L — ABNORMAL LOW (ref 96–106)
Creatinine, Ser: 0.61 mg/dL (ref 0.57–1.00)
Globulin, Total: 3.1 g/dL (ref 1.5–4.5)
Glucose: 62 mg/dL — ABNORMAL LOW (ref 70–99)
Potassium: 3.7 mmol/L (ref 3.5–5.2)
Sodium: 136 mmol/L (ref 134–144)
Total Protein: 6.7 g/dL (ref 6.0–8.5)
eGFR: 107 mL/min/{1.73_m2} (ref >59)

## 2022-03-02 NOTE — Telephone Encounter (Signed)
I know Karsten Fells had a few she wanted me to see.  Ok to set Ms Bosques up with me.  Please make sure she has a 39mn slot.

## 2022-03-02 NOTE — Telephone Encounter (Signed)
scheduled

## 2022-03-02 NOTE — Telephone Encounter (Signed)
Received referral from Dr. Berline Lopes to contact patient to discuss taste changes. I called however, she was unavailable. I left message and will try to call her again on Monday.

## 2022-03-03 ENCOUNTER — Other Ambulatory Visit: Payer: Self-pay

## 2022-03-05 ENCOUNTER — Telehealth: Payer: Self-pay | Admitting: Dietician

## 2022-03-05 NOTE — Telephone Encounter (Signed)
This is one we talked about, you see her daughter.

## 2022-03-05 NOTE — Telephone Encounter (Signed)
Second attempt to contact patient via telephone to discuss taste changes. Patient did not answer. Unable to leave message. Scheduled nutrition appointment for Thursday 10/5 at [facility] following office visit with Dr. Delton Coombes.

## 2022-03-06 ENCOUNTER — Encounter: Payer: Self-pay | Admitting: Hematology

## 2022-03-06 ENCOUNTER — Inpatient Hospital Stay (HOSPITAL_COMMUNITY): Payer: Commercial Managed Care - PPO

## 2022-03-06 ENCOUNTER — Other Ambulatory Visit: Payer: Self-pay

## 2022-03-06 ENCOUNTER — Emergency Department (HOSPITAL_COMMUNITY): Payer: Commercial Managed Care - PPO

## 2022-03-06 ENCOUNTER — Observation Stay (HOSPITAL_COMMUNITY)
Admission: EM | Admit: 2022-03-06 | Discharge: 2022-03-07 | Disposition: A | Discharge status: Home / Self Care | Payer: Commercial Managed Care - PPO | Attending: Internal Medicine | Admitting: Internal Medicine

## 2022-03-06 ENCOUNTER — Encounter (HOSPITAL_COMMUNITY): Payer: Self-pay | Admitting: Emergency Medicine

## 2022-03-06 DIAGNOSIS — E871 Hypo-osmolality and hyponatremia: Secondary | ICD-10-CM | POA: Diagnosis present

## 2022-03-06 DIAGNOSIS — Z7982 Long term (current) use of aspirin: Secondary | ICD-10-CM

## 2022-03-06 DIAGNOSIS — Z923 Personal history of irradiation: Secondary | ICD-10-CM | POA: Diagnosis not present

## 2022-03-06 DIAGNOSIS — I2699 Other pulmonary embolism without acute cor pulmonale: Secondary | ICD-10-CM | POA: Diagnosis not present

## 2022-03-06 DIAGNOSIS — E119 Type 2 diabetes mellitus without complications: Secondary | ICD-10-CM | POA: Diagnosis present

## 2022-03-06 DIAGNOSIS — R55 Syncope and collapse: Secondary | ICD-10-CM | POA: Diagnosis present

## 2022-03-06 DIAGNOSIS — C569 Malignant neoplasm of unspecified ovary: Secondary | ICD-10-CM | POA: Diagnosis present

## 2022-03-06 DIAGNOSIS — N1330 Unspecified hydronephrosis: Secondary | ICD-10-CM | POA: Diagnosis present

## 2022-03-06 DIAGNOSIS — R0609 Other forms of dyspnea: Secondary | ICD-10-CM | POA: Diagnosis not present

## 2022-03-06 DIAGNOSIS — I10 Essential (primary) hypertension: Secondary | ICD-10-CM | POA: Diagnosis present

## 2022-03-06 DIAGNOSIS — Z8616 Personal history of COVID-19: Secondary | ICD-10-CM

## 2022-03-06 DIAGNOSIS — Z8052 Family history of malignant neoplasm of bladder: Secondary | ICD-10-CM | POA: Diagnosis not present

## 2022-03-06 DIAGNOSIS — N132 Hydronephrosis with renal and ureteral calculous obstruction: Secondary | ICD-10-CM | POA: Diagnosis present

## 2022-03-06 DIAGNOSIS — D6859 Other primary thrombophilia: Secondary | ICD-10-CM | POA: Diagnosis present

## 2022-03-06 DIAGNOSIS — N201 Calculus of ureter: Secondary | ICD-10-CM | POA: Diagnosis not present

## 2022-03-06 DIAGNOSIS — Z803 Family history of malignant neoplasm of breast: Secondary | ICD-10-CM | POA: Diagnosis not present

## 2022-03-06 DIAGNOSIS — Z8541 Personal history of malignant neoplasm of cervix uteri: Secondary | ICD-10-CM

## 2022-03-06 DIAGNOSIS — E876 Hypokalemia: Secondary | ICD-10-CM | POA: Diagnosis not present

## 2022-03-06 DIAGNOSIS — E782 Mixed hyperlipidemia: Secondary | ICD-10-CM | POA: Diagnosis present

## 2022-03-06 DIAGNOSIS — I2694 Multiple subsegmental pulmonary emboli without acute cor pulmonale: Principal | ICD-10-CM | POA: Diagnosis present

## 2022-03-06 DIAGNOSIS — Z79899 Other long term (current) drug therapy: Secondary | ICD-10-CM | POA: Diagnosis not present

## 2022-03-06 DIAGNOSIS — Z560 Unemployment, unspecified: Secondary | ICD-10-CM

## 2022-03-06 DIAGNOSIS — Z6832 Body mass index (BMI) 32.0-32.9, adult: Secondary | ICD-10-CM

## 2022-03-06 DIAGNOSIS — F32A Depression, unspecified: Secondary | ICD-10-CM | POA: Diagnosis present

## 2022-03-06 DIAGNOSIS — C482 Malignant neoplasm of peritoneum, unspecified: Secondary | ICD-10-CM | POA: Diagnosis present

## 2022-03-06 DIAGNOSIS — C786 Secondary malignant neoplasm of retroperitoneum and peritoneum: Secondary | ICD-10-CM | POA: Diagnosis present

## 2022-03-06 DIAGNOSIS — M549 Dorsalgia, unspecified: Secondary | ICD-10-CM | POA: Diagnosis not present

## 2022-03-06 DIAGNOSIS — I2609 Other pulmonary embolism with acute cor pulmonale: Secondary | ICD-10-CM

## 2022-03-06 DIAGNOSIS — Z8249 Family history of ischemic heart disease and other diseases of the circulatory system: Secondary | ICD-10-CM

## 2022-03-06 DIAGNOSIS — E860 Dehydration: Secondary | ICD-10-CM | POA: Diagnosis present

## 2022-03-06 DIAGNOSIS — E785 Hyperlipidemia, unspecified: Secondary | ICD-10-CM | POA: Diagnosis not present

## 2022-03-06 DIAGNOSIS — F419 Anxiety disorder, unspecified: Secondary | ICD-10-CM | POA: Diagnosis present

## 2022-03-06 DIAGNOSIS — E669 Obesity, unspecified: Secondary | ICD-10-CM | POA: Diagnosis present

## 2022-03-06 DIAGNOSIS — Z9221 Personal history of antineoplastic chemotherapy: Secondary | ICD-10-CM | POA: Diagnosis not present

## 2022-03-06 DIAGNOSIS — M5430 Sciatica, unspecified side: Secondary | ICD-10-CM | POA: Diagnosis present

## 2022-03-06 DIAGNOSIS — N134 Hydroureter: Secondary | ICD-10-CM | POA: Diagnosis not present

## 2022-03-06 DIAGNOSIS — E66811 Obesity, class 1: Secondary | ICD-10-CM | POA: Diagnosis present

## 2022-03-06 HISTORY — DX: Other pulmonary embolism without acute cor pulmonale: I26.99

## 2022-03-06 LAB — BASIC METABOLIC PANEL
Anion gap: 17 — ABNORMAL HIGH (ref 5–15)
BUN: 7 mg/dL (ref 6–20)
CO2: 21 mmol/L — ABNORMAL LOW (ref 22–32)
Calcium: 8.4 mg/dL — ABNORMAL LOW (ref 8.9–10.3)
Chloride: 95 mmol/L — ABNORMAL LOW (ref 98–111)
Creatinine, Ser: 0.8 mg/dL (ref 0.44–1.00)
GFR, Estimated: >60 mL/min (ref >60)
Glucose, Bld: 86 mg/dL (ref 70–99)
Potassium: 2.8 mmol/L — ABNORMAL LOW (ref 3.5–5.1)
Sodium: 133 mmol/L — ABNORMAL LOW (ref 135–145)

## 2022-03-06 LAB — URINALYSIS, ROUTINE W REFLEX MICROSCOPIC
Bacteria, UA: NONE SEEN
Bilirubin Urine: NEGATIVE
Glucose, UA: NEGATIVE mg/dL
Ketones, ur: 20 mg/dL — AB
Nitrite: NEGATIVE
Protein, ur: NEGATIVE mg/dL
Specific Gravity, Urine: 1.03 (ref 1.005–1.030)
pH: 6 (ref 5.0–8.0)

## 2022-03-06 LAB — CBC
HCT: 35 % — ABNORMAL LOW (ref 36.0–46.0)
Hemoglobin: 11.9 g/dL — ABNORMAL LOW (ref 12.0–15.0)
MCH: 32.5 pg (ref 26.0–34.0)
MCHC: 34 g/dL (ref 30.0–36.0)
MCV: 95.6 fL (ref 80.0–100.0)
Platelets: 183 10*3/uL (ref 150–400)
RBC: 3.66 MIL/uL — ABNORMAL LOW (ref 3.87–5.11)
RDW: 21.3 % — ABNORMAL HIGH (ref 11.5–15.5)
WBC: 6.5 10*3/uL (ref 4.0–10.5)
nRBC: 0 % (ref 0.0–0.2)

## 2022-03-06 LAB — ECHOCARDIOGRAM LIMITED
Height: 62 in
S' Lateral: 2.9 cm
Weight: 2821.89 oz

## 2022-03-06 LAB — MAGNESIUM: Magnesium: 1.7 mg/dL (ref 1.7–2.4)

## 2022-03-06 LAB — HEPARIN LEVEL (UNFRACTIONATED): Heparin Unfractionated: >1.1 IU/mL — ABNORMAL HIGH (ref 0.30–0.70)

## 2022-03-06 LAB — CBG MONITORING, ED: Glucose-Capillary: 89 mg/dL (ref 70–99)

## 2022-03-06 LAB — TROPONIN I (HIGH SENSITIVITY): Troponin I (High Sensitivity): 2 ng/L (ref <18)

## 2022-03-06 MED ORDER — HEPARIN BOLUS VIA INFUSION
4500.0000 [IU] | Freq: Once | INTRAVENOUS | Status: AC
  Administered 2022-03-06: 4500 [IU] via INTRAVENOUS

## 2022-03-06 MED ORDER — ACETAMINOPHEN 325 MG PO TABS: 650.0000 mg | ORAL_TABLET | Freq: Four times a day (QID) | ORAL | Status: DC | PRN

## 2022-03-06 MED ORDER — MAGNESIUM SULFATE 2 GM/50ML IV SOLN
2.0000 g | Freq: Once | INTRAVENOUS | Status: AC
  Administered 2022-03-06: 2 g via INTRAVENOUS
  Filled 2022-03-06: qty 50

## 2022-03-06 MED ORDER — POTASSIUM CHLORIDE IN NACL 20-0.9 MEQ/L-% IV SOLN
INTRAVENOUS | Status: DC
  Filled 2022-03-06: qty 1000

## 2022-03-06 MED ORDER — IOHEXOL 350 MG/ML SOLN
100.0000 mL | Freq: Once | INTRAVENOUS | Status: AC | PRN
  Administered 2022-03-06: 100 mL via INTRAVENOUS

## 2022-03-06 MED ORDER — ONDANSETRON HCL 4 MG PO TABS: 4.0000 mg | ORAL_TABLET | Freq: Four times a day (QID) | ORAL | Status: DC | PRN

## 2022-03-06 MED ORDER — MAGNESIUM OXIDE -MG SUPPLEMENT 400 (240 MG) MG PO TABS: 400.0000 mg | ORAL_TABLET | Freq: Every day | ORAL | Status: DC

## 2022-03-06 MED ORDER — ATORVASTATIN CALCIUM 20 MG PO TABS
20.0000 mg | ORAL_TABLET | Freq: Every day | ORAL | Status: DC
  Administered 2022-03-06: 20 mg via ORAL
  Filled 2022-03-06: qty 1

## 2022-03-06 MED ORDER — CALCIUM CARBONATE ANTACID 500 MG PO CHEW
1.0000 | CHEWABLE_TABLET | Freq: Three times a day (TID) | ORAL | Status: DC
  Filled 2022-03-06: qty 1

## 2022-03-06 MED ORDER — HEPARIN (PORCINE) 25000 UT/250ML-% IV SOLN
900.0000 [IU]/h | INTRAVENOUS | Status: DC
  Administered 2022-03-06: 1100 [IU]/h via INTRAVENOUS
  Administered 2022-03-07: 900 [IU]/h via INTRAVENOUS
  Filled 2022-03-06 (×2): qty 250

## 2022-03-06 MED ORDER — MAGNESIUM SULFATE 2 GM/50ML IV SOLN: 2.0000 g | Freq: Once | INTRAVENOUS | Status: DC

## 2022-03-06 MED ORDER — TAMSULOSIN HCL 0.4 MG PO CAPS
0.4000 mg | ORAL_CAPSULE | Freq: Every day | ORAL | Status: DC
  Administered 2022-03-06: 0.4 mg via ORAL
  Filled 2022-03-06 (×2): qty 1

## 2022-03-06 MED ORDER — BISACODYL 5 MG PO TBEC: 5.0000 mg | DELAYED_RELEASE_TABLET | Freq: Every day | ORAL | Status: DC | PRN

## 2022-03-06 MED ORDER — OXYCODONE HCL 5 MG PO TABS: 5.0000 mg | ORAL_TABLET | Freq: Four times a day (QID) | ORAL | Status: DC | PRN

## 2022-03-06 MED ORDER — POTASSIUM CHLORIDE CRYS ER 20 MEQ PO TBCR
40.0000 meq | EXTENDED_RELEASE_TABLET | Freq: Once | ORAL | Status: AC
  Administered 2022-03-06: 40 meq via ORAL
  Filled 2022-03-06: qty 2

## 2022-03-06 MED ORDER — ONDANSETRON HCL 4 MG/2ML IJ SOLN: 4.0000 mg | Freq: Four times a day (QID) | INTRAMUSCULAR | Status: DC | PRN

## 2022-03-06 MED ORDER — SODIUM CHLORIDE 0.9 % IV BOLUS
1000.0000 mL | Freq: Once | INTRAVENOUS | Status: AC
  Administered 2022-03-06: 1000 mL via INTRAVENOUS

## 2022-03-06 MED ORDER — ASPIRIN 81 MG PO TBEC
81.0000 mg | DELAYED_RELEASE_TABLET | Freq: Every day | ORAL | Status: DC
  Administered 2022-03-07: 81 mg via ORAL
  Filled 2022-03-06: qty 1

## 2022-03-06 MED ORDER — FENTANYL CITRATE PF 50 MCG/ML IJ SOSY: 12.5000 ug | PREFILLED_SYRINGE | INTRAMUSCULAR | Status: DC | PRN

## 2022-03-06 MED ORDER — ACETAMINOPHEN 650 MG RE SUPP: 650.0000 mg | Freq: Four times a day (QID) | RECTAL | Status: DC | PRN

## 2022-03-06 MED ORDER — TRAZODONE HCL 50 MG PO TABS: 50.0000 mg | ORAL_TABLET | Freq: Every evening | ORAL | Status: DC | PRN

## 2022-03-06 NOTE — Assessment & Plan Note (Addendum)
-   Associated with mild right hydroureteronephrosis -Dr. Wynetta Emery spoke with urologist Dr. Felipa Eth. He recommends to treat conservatively for now as there is high chance that she will pass stone due to its size.  He recommended pain management, tamsulosin, strain urine.  Reach out to urology if there is any further problem.   -03/07/22--suspect pt has expelled stone as her pain is completely gone at time of dc

## 2022-03-06 NOTE — Assessment & Plan Note (Signed)
-   Blood pressures well managed continue to follow

## 2022-03-06 NOTE — Assessment & Plan Note (Signed)
-   Secondary to acute pulmonary embolus, treating supportively - stable on RA

## 2022-03-06 NOTE — ED Notes (Signed)
Patient transported to CT 

## 2022-03-06 NOTE — Assessment & Plan Note (Signed)
-   Secondary to dehydration, treating with normal saline infusion IV -improved with IVF

## 2022-03-06 NOTE — Assessment & Plan Note (Signed)
-   Currently undergoing chemotherapy with Dr. Delton Coombes -Notify oncology team of admission

## 2022-03-06 NOTE — Assessment & Plan Note (Signed)
-   follow with Dr. Delton Coombes and Dr. Jeral Pinch -had been scheduled for interval debulking surgery in the stage of metastatic GYN malignancy, but concerned about pt's fitness for surgery due to weakness

## 2022-03-06 NOTE — Progress Notes (Signed)
Patient does not want to go to restroom at this time, due to her weakness, she has a purewick on , refused to go to bathroom at this time, will pass on to third shift to encourage patient to walk to restroom so urine strain can be done. Dr. Wynetta Emery aware .

## 2022-03-06 NOTE — Assessment & Plan Note (Addendum)
-   Add Tums - Add albumin to a.m. labs to calculate free calcium - 10/4 corrected calcium 9.0

## 2022-03-06 NOTE — Assessment & Plan Note (Signed)
-   Oral and IV replacement ordered, check magnesium and replace as needed

## 2022-03-06 NOTE — Progress Notes (Signed)
ANTICOAGULATION CONSULT NOTE - Initial Consult  Pharmacy Consult for heparin Indication: pulmonary embolus  Allergies  Allergen Reactions   Augmentin [Amoxicillin-Pot Clavulanate] Diarrhea and Nausea And Vomiting   Doxycycline     Abdominal cramps.   Topamax [Topiramate] Nausea And Vomiting    Patient Measurements: Height: '5\' 2"'$  (157.5 cm) Weight: 80 kg (176 lb 5.9 oz) IBW/kg (Calculated) : 50.1 Heparin Dosing Weight: 68 kg  Vital Signs: Temp: 97.5 F (36.4 C) (10/03 0542) Temp Source: Oral (10/03 0542) BP: 112/80 (10/03 0700) Pulse Rate: 84 (10/03 0700)  Labs: Recent Labs    03/06/22 0546 03/06/22 0554  HGB 11.9*  --   HCT 35.0*  --   PLT 183  --   CREATININE 0.80  --   TROPONINIHS  --  2    Estimated Creatinine Clearance: 79.7 mL/min (by C-G formula based on SCr of 0.8 mg/dL).   Medical History: Past Medical History:  Diagnosis Date   Acute respiratory disease due to COVID-19 virus 02/02/2019   Anxiety    Cancer ([place])    Depression    Dyspnea    Dysrhythmia    History of diabetes mellitus    Resolved in Sept 2023 with weight loss.   Hypertension    Ovarian cancer ([place]) 11/14/2021   Peritoneal carcinomatosis ([place])    Pneumonia    Sleep apnea    doesn't use CPAP   Tachycardia     Medications:  (Not in a hospital admission)   Assessment: Pharmacy consulted to dose heparin in patient with pulmonary embolism confirmed by CT angio. She is not on anticoagulation prior to admission.  CBC WNL  Goal of Therapy:  Heparin level 0.3-0.7 units/ml Monitor platelets by anticoagulation protocol: Yes   Plan:  Give 4500 units bolus x 1 Start heparin infusion at 1100 units/hr Check anti-Xa level in 6 hours and daily while on heparin Continue to monitor H&H and platelets  Margot Ables, PharmD Clinical Pharmacist 03/06/2022 7:38 AM

## 2022-03-06 NOTE — Progress Notes (Signed)
Pt refused use of CPAP, don't use one at home but will call if she changes her mind.

## 2022-03-06 NOTE — ED Provider Notes (Signed)
[facility] Emergency Department Provider Note MRN:  [id number]  Arrival date & time: 03/06/22     Chief Complaint   Loss of Consciousness   History of Present Illness   Nicole Santos is a 51 y.o. year-old female with a history of ovarian cancer, peritoneal carcinomatosis presenting to the ED with chief complaint of loss of consciousness.  Sitting on the commode and passed out.  Denies chest pain or shortness of breath before or after the syncopal episode.  Had some abdominal pain yesterday, more of a right flank pain.  Currently feels fatigued, tired.  Last chemo 2 months ago  Review of Systems  A thorough review of systems was obtained and all systems are negative except as noted in the HPI and PMH.   Patient's Health History    Past Medical History:  Diagnosis Date   Acute respiratory disease due to COVID-19 virus 02/02/2019   Anxiety    Cancer ([place])    Depression    Dyspnea    Dysrhythmia    History of diabetes mellitus    Resolved in Sept 2023 with weight loss.   Hypertension    Ovarian cancer ([place]) 11/14/2021   Peritoneal carcinomatosis ([place])    Pneumonia    Sleep apnea    doesn't use CPAP   Tachycardia     Past Surgical History:  Procedure Laterality Date   KNEE SURGERY Right    Arthroscopic   LAPAROSCOPIC ABDOMINAL EXPLORATION N/A 11/13/2021   Procedure: Exploratory laparoscopy;  Surgeon: Aviva Signs, MD;  Location: [facility];  Service: General;  Laterality: N/A;   PORTACATH PLACEMENT Left 11/15/2021   Procedure: INSERTION PORT-A-CATH;  Surgeon: Aviva Signs, MD;  Location: [facility];  Service: General;  Laterality: Left;   TONSILLECTOMY AND ADENOIDECTOMY      Family History  Problem Relation Age of Onset   Hypertension Mother    Bladder Cancer Maternal Grandmother    Breast cancer Maternal Great-grandmother    Colon cancer Neg Hx    Ovarian cancer Neg Hx    Endometrial cancer Neg Hx    Pancreatic cancer Neg Hx    Prostate cancer Neg  Hx     Social History   Socioeconomic History   Marital status: Married    Spouse name: Not on file   Number of children: Not on file   Years of education: Not on file   Highest education level: Not on file  Occupational History   Occupation: Unemployed  Tobacco Use   Smoking status: Never   Smokeless tobacco: Never  Vaping Use   Vaping Use: Never used  Substance and Sexual Activity   Alcohol use: No   Drug use: No   Sexual activity: Not Currently  Other Topics Concern   Not on file  Social History Narrative   Not on file   Social Determinants of Health   Financial Resource Strain: Not on file  Food Insecurity: No Food Insecurity (02/17/2022)   Hunger Vital Sign    Worried About Running Out of Food in the Last Year: Never true    Ran Out of Food in the Last Year: Never true  Transportation Needs: No Transportation Needs (02/17/2022)   PRAPARE - Hydrologist (Medical): No    Lack of Transportation (Non-Medical): No  Physical Activity: Not on file  Stress: Not on file  Social Connections: Not on file  Intimate Partner Violence: Not At Risk (02/17/2022)   Humiliation, Afraid, Rape,  and Kick questionnaire    Fear of Current or Ex-Partner: No    Emotionally Abused: No    Physically Abused: No    Sexually Abused: No     Physical Exam   Vitals:   03/06/22 0630 03/06/22 0700  BP: 124/63 112/80  Pulse: (!) 57 84  Resp: 20 (!) 22  Temp:    SpO2: 99% 100%    CONSTITUTIONAL: Well-appearing, NAD NEURO/PSYCH:  Alert and oriented x 3, no focal deficits EYES:  eyes equal and reactive ENT/NECK:  no LAD, no JVD CARDIO: Regular rate, well-perfused, normal S1 and S2 PULM:  CTAB no wheezing or rhonchi GI/GU:  non-distended, non-tender MSK/SPINE:  No gross deformities, no edema SKIN:  no rash, atraumatic   *Additional and/or pertinent findings included in MDM below  Diagnostic and Interventional Summary    EKG  Interpretation  Date/Time:  Tuesday March 06 2022 05:47:57 EDT Ventricular Rate:  73 PR Interval:  134 QRS Duration: 104 QT Interval:  404 QTC Calculation: 446 R Axis:   40 Text Interpretation: Sinus rhythm Low voltage, precordial leads Borderline repolarization abnormality Baseline wander in lead(s) V4 Confirmed by Gerlene Fee [phone]) on 03/06/2022 7:10:14 AM       Labs Reviewed  BASIC METABOLIC PANEL - Abnormal; Notable for the following components:      Result Value   Sodium 133 (*)    Potassium 2.8 (*)    Chloride 95 (*)    CO2 21 (*)    Calcium 8.4 (*)    Anion gap 17 (*)    All other components within normal limits  CBC - Abnormal; Notable for the following components:   RBC 3.66 (*)    Hemoglobin 11.9 (*)    HCT 35.0 (*)    RDW 21.3 (*)    All other components within normal limits  URINALYSIS, ROUTINE W REFLEX MICROSCOPIC  CBG MONITORING, ED  TROPONIN I (HIGH SENSITIVITY)  TROPONIN I (HIGH SENSITIVITY)    CT Angio Chest Pulmonary Embolism (PE) W or WO Contrast  Final Result    CT ABDOMEN PELVIS W CONTRAST  Final Result      Medications  potassium chloride SA (KLOR-CON M) CR tablet 40 mEq (has no administration in time range)  sodium chloride 0.9 % bolus 1,000 mL (1,000 mLs Intravenous New Bag/Given 03/06/22 0621)  iohexol (OMNIPAQUE) 350 MG/ML injection 100 mL (100 mLs Intravenous Contrast Given 03/06/22 0644)     Procedures  /  Critical Care .Critical Care  Performed by: Maudie Flakes, MD Authorized by: Maudie Flakes, MD   Critical care provider statement:    Critical care time (minutes):  45   Critical care was necessary to treat or prevent imminent or life-threatening deterioration of the following conditions: Acute pulmonary emboli.   Critical care was time spent personally by me on the following activities:  Development of treatment plan with patient or surrogate, discussions with consultants, evaluation of patient's response to treatment,  examination of patient, ordering and review of laboratory studies, ordering and review of radiographic studies, ordering and performing treatments and interventions, pulse oximetry, re-evaluation of patient's condition and review of old charts   ED Course and Medical Decision Making  Initial Impression and Ddx Syncopal episode, recent admission for the same, felt to be related to hypovolemia.  Given the active cancer there is also consideration for PE.  With this in the recent abdominal pain, will obtain CT imaging.  Providing fluids.  Patient has a reassuring neurological exam  at this time, normal and symmetric strength and sensation, normal coordination, normal speech, denies any traumatic injuries or pain from the fall.  Past medical/surgical history that increases complexity of ED encounter: Peritoneal carcinomatosis  Interpretation of Diagnostics I personally reviewed the laboratory assessment and my interpretation is as follows: Hyponatremia, hypokalemia noted, no significant blood count disturbance  Called by radiology for CT findings, multiple subsegmental right-sided PEs, also kidney stone in the ureter on the right.  Patient Reassessment and Ultimate Disposition/Management     We will admit to medicine.  Starting heparin.  Patient management required discussion with the following services or consulting groups:  Hospitalist Service  Complexity of Problems Addressed Acute illness or injury that poses threat of life of bodily function  Additional Data Reviewed and Analyzed Further history obtained from: Recent discharge summary and Recent Consult notes  Additional Factors Impacting ED Encounter Risk Consideration of hospitalization  Barth Kirks. [place] Small, [place] [email or web address]  Final Clinical Impressions(s) / ED Diagnoses     ICD-10-CM   1. Acute pulmonary embolism, unspecified pulmonary embolism type, unspecified  whether acute cor pulmonale present ([place])  I26.99       ED Discharge Orders     None        Discharge Instructions Discussed with and Provided to Patient:   Discharge Instructions   None      Maudie Flakes, MD 03/06/22 [id number]

## 2022-03-06 NOTE — Progress Notes (Signed)
ANTICOAGULATION CONSULT NOTE -   Pharmacy Consult for heparin Indication: pulmonary embolus  Allergies  Allergen Reactions   Augmentin [Amoxicillin-Pot Clavulanate] Diarrhea and Nausea And Vomiting   Doxycycline     Abdominal cramps.   Topamax [Topiramate] Nausea And Vomiting    Patient Measurements: Height: '5\' 2"'$  (157.5 cm) Weight: 80 kg (176 lb 5.9 oz) IBW/kg (Calculated) : 50.1 Heparin Dosing Weight: 68 kg  Vital Signs: Temp: 97.8 F (36.6 C) (10/03 1700) Temp Source: Oral (10/03 1700) BP: 114/65 (10/03 1700) Pulse Rate: 69 (10/03 1700)  Labs: Recent Labs    03/06/22 0546 03/06/22 0554 03/06/22 1512  HGB 11.9*  --   --   HCT 35.0*  --   --   PLT 183  --   --   HEPARINUNFRC  --   --  >1.10*  CREATININE 0.80  --   --   TROPONINIHS  --  2  --      Estimated Creatinine Clearance: 79.7 mL/min (by C-G formula based on SCr of 0.8 mg/dL).   Medical History: Past Medical History:  Diagnosis Date   Acute respiratory disease due to COVID-19 virus 02/02/2019   Anxiety    Cancer ([place])    Depression    Dyspnea    Dysrhythmia    History of diabetes mellitus    Resolved in Sept 2023 with weight loss.   Hypertension    Ovarian cancer ([place]) 11/14/2021   Peritoneal carcinomatosis ([place])    Pneumonia    Sleep apnea    doesn't use CPAP   Tachycardia     Medications:  Medications Prior to Admission  Medication Sig Dispense Refill Last Dose   aspirin EC 81 MG tablet Take 1 tablet (81 mg total) by mouth daily with breakfast. 30 tablet 3 03/06/2022   atorvastatin (LIPITOR) 20 MG tablet Take 1 tablet (20 mg total) by mouth daily. (Patient taking differently: Take 20 mg by mouth at bedtime.) 90 tablet 1 03/05/2022   magnesium oxide (MAG-OX) 400 (240 Mg) MG tablet Take 1 tablet (400 mg total) by mouth 2 (two) times daily. (Patient taking differently: Take 400 mg by mouth at bedtime.) 60 tablet 2 03/06/2022   Potassium 99 MG TABS Take 99 mg by mouth at bedtime.   03/06/2022    Beneprotein PACK Take 1 Scoop (6 g total) by mouth 3 (three) times daily. (Patient not taking: Reported on 03/06/2022) 90 each 2 Not Taking   cephALEXin (KEFLEX) 500 MG capsule Take 1 capsule (500 mg total) by mouth 2 (two) times daily. (Patient not taking: Reported on 03/06/2022) 14 capsule 0 Completed Course   metoprolol succinate (TOPROL-XL) 25 MG 24 hr tablet Take 0.5 tablets (12.5 mg total) by mouth daily. (Patient not taking: Reported on 03/06/2022) 15 tablet 2 Not Taking   mirtazapine (REMERON) 15 MG tablet Take 1 tablet (15 mg total) by mouth at bedtime. (Patient not taking: Reported on 03/06/2022) 30 tablet 3 Not Taking   Misc. Throat Products (LEMON-GLYCERIN) SWAB Use as directed 1 each in the mouth or throat 5 (five) times daily. (Patient not taking: Reported on 03/06/2022) 450 each 3 Not Taking    Assessment: Pharmacy consulted to dose heparin in patient with pulmonary embolism confirmed by CT angio. She is not on anticoagulation prior to admission.  HL >1.10 CBC WNL  Goal of Therapy:  Heparin level 0.3-0.7 units/ml Monitor platelets by anticoagulation protocol: Yes   Plan:  Hold heparin infusion for 1 hour. Decrease heparin infusion to 900 units/hr  Check anti-Xa level in 6 hours and daily Continue to monitor H&H and platelets.   Margot Ables, PharmD Clinical Pharmacist 03/06/2022 5:16 PM

## 2022-03-06 NOTE — Assessment & Plan Note (Addendum)
-   Reportedly mild, discussed with urology Felipa Eth), conservative management for now recommended, pain management, tamsulosin, strain urine

## 2022-03-06 NOTE — Assessment & Plan Note (Signed)
-   Resume home atorvastatin 20 mg nightly

## 2022-03-06 NOTE — Progress Notes (Signed)
*  PRELIMINARY RESULTS* Echocardiogram Limited 2-D Echocardiogram  has been performed.  Nicole Santos 03/06/2022, 2:02 PM

## 2022-03-06 NOTE — Assessment & Plan Note (Signed)
-   Secondary to hypercoagulable state associated with cancer -Check venous Doppler ultrasound of lower extremities to rule out DVT -Started IV heparin infusion>>d/c home with apixaban  -03/06/22 2D echo--EF 55-60%, no WMA, normal RVF

## 2022-03-06 NOTE — Hospital Course (Signed)
51 year old female with history of ovarian cancer, abdominal carcinomatosis currently on chemotherapy, last chemo treatment 2 months ago, depression anxiety, type 2 diabetes mellitus, hypertension and recently discharge for recurrent syncopal episodes and dehydration presented to the ED with complaint of syncope.  Patient reports that she passed out while having a bowel movement on the toilet.  She has been feeling weaker than usual.  She has had intermittent shortness of breath.  She has no chest pain symptoms.  She is reporting right flank pain.  She is tired and fatigued.  No edema in the legs.  She was sent for CT with findings of 6 multiple subsegmental right-sided pulmonary emboli and also kidney stone in the right ureter and mild hydroureteronephrosis on the right side.  She was noted to be dehydrated and have electrolyte abnormalities.  She was started on an IV heparin infusion and admission was requested.

## 2022-03-06 NOTE — Assessment & Plan Note (Signed)
-   This is chronic and feels different than the flank pain that she is experiencing at this time -Treat supportively and symptomatic management

## 2022-03-06 NOTE — Assessment & Plan Note (Signed)
-   Lifestyle modifications advised - BMI 32.26

## 2022-03-06 NOTE — H&P (Addendum)
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  Patient reports that she passed out while having a bowel movement on the toilet.  She has been feeling weaker than usual.  She has had intermittent shortness of breath.  She has no chest pain symptoms.  She is reporting right flank pain.  She is tired and fatigued.  No edema in the legs.  She was sent for CT with findings of 6 multiple subsegmental right-sided pulmonary emboli and also kidney stone in the right ureter and mild hydroureteronephrosis on the right side.  She was noted to be dehydrated and have electrolyte abnormalities.  She was started on an IV heparin infusion and admission was requested.   Past Medical History:  Diagnosis Date   Acute respiratory disease due to COVID-19 virus 02/02/2019   Anxiety    Cancer ([place])    Depression    Dyspnea    Dysrhythmia    History of diabetes mellitus    Resolved in Sept 2023 with weight loss.   Hypertension    Ovarian cancer ([place]) 11/14/2021   Peritoneal carcinomatosis ([place])    Pneumonia    Sleep apnea    doesn't use CPAP   Tachycardia     Past Surgical History:  Procedure Laterality Date   KNEE SURGERY Right    Arthroscopic   LAPAROSCOPIC ABDOMINAL EXPLORATION N/A 11/13/2021   Procedure: Exploratory laparoscopy;  Surgeon: Aviva Signs, MD;  Location: [facility];  Service: General;  Laterality: N/A;    PORTACATH PLACEMENT Left 11/15/2021   Procedure: INSERTION PORT-A-CATH;  Surgeon: Aviva Signs, MD;  Location: [facility];  Service: General;  Laterality: Left;   TONSILLECTOMY AND ADENOIDECTOMY       reports that she has never smoked. She has never used smokeless tobacco. She reports that she does not drink alcohol and does not use drugs.  Allergies  Allergen Reactions   Augmentin [Amoxicillin-Pot Clavulanate] Diarrhea and Nausea And Vomiting   Doxycycline     Abdominal cramps.   Topamax [Topiramate] Nausea And Vomiting    Family History  Problem Relation Age of Onset   Hypertension Mother    Bladder Cancer Maternal Grandmother    Breast cancer Maternal Great-grandmother    Colon cancer Neg Hx    Ovarian cancer Neg Hx    Endometrial cancer Neg Hx    Pancreatic cancer Neg Hx    Prostate cancer Neg Hx     Prior to Admission medications   Medication Sig Start Date End Date Taking? Authorizing Provider  aspirin EC 81 MG tablet Take 1 tablet (81 mg total) by mouth daily with breakfast. 02/21/22  Yes Emokpae, Courage, MD  atorvastatin (LIPITOR) 20 MG tablet Take 1 tablet (20 mg total) by mouth daily. Patient taking differently: Take 20 mg by mouth at bedtime. 02/08/22  Yes Hendricks Limes F, FNP  magnesium oxide (MAG-OX) 400 (240 Mg) MG tablet Take 1 tablet (400 mg total) by mouth 2 (two) times daily.  Patient taking differently: Take 400 mg by mouth at bedtime. 01/02/22  Yes Derek Jack, MD  Potassium 99 MG TABS Take 99 mg by mouth at bedtime.   Yes [provider]  Beneprotein PACK Take 1 Scoop (6 g total) by mouth 3 (three) times daily. Patient not taking: Reported on 03/06/2022 02/08/22   Loman Brooklyn, FNP  cephALEXin (KEFLEX) 500 MG capsule Take 1 capsule (500 mg total) by mouth 2 (two) times daily. Patient not taking: Reported on 03/06/2022 02/23/22   Evelina Dun A, FNP  metoprolol succinate (TOPROL-XL) 25 MG 24 hr tablet Take 0.5 tablets (12.5 mg total) by mouth  daily. Patient not taking: Reported on 03/06/2022 02/08/22   Loman Brooklyn, FNP  mirtazapine (REMERON) 15 MG tablet Take 1 tablet (15 mg total) by mouth at bedtime. Patient not taking: Reported on 03/06/2022 02/21/22   Roxan Hockey, MD  Misc. Throat Products (LEMON-GLYCERIN) SWAB Use as directed 1 each in the mouth or throat 5 (five) times daily. Patient not taking: Reported on 03/06/2022 03/01/22   Dettinger, Fransisca Kaufmann, MD    Physical Exam: Vitals:   03/06/22 0541 03/06/22 0542 03/06/22 0630 03/06/22 0700  BP:  122/74 124/63 112/80  Pulse:  73 (!) 57 84  Resp:   20 (!) 22  Temp:  (!) 97.5 F (36.4 C)    TempSrc:  Oral    SpO2:  100% 99% 100%  Weight: 80 kg     Height: '5\' 2"'$  (1.575 m)       Constitutional: NAD, calm, comfortable Eyes: PERRL, lids and conjunctivae normal ENMT: Mucous membranes are moist. Posterior pharynx clear of any exudate or lesions.Normal dentition.  Neck: normal, supple, no masses, no thyromegaly Respiratory: clear to auscultation bilaterally, no wheezing, no crackles. Normal respiratory effort. No accessory muscle use.  Cardiovascular: normal s1, s2 sounds, no murmurs / rubs / gallops. No extremity edema. 2+ pedal pulses. No carotid bruits.  Abdomen: no tenderness, no masses palpated. No hepatosplenomegaly. Bowel sounds positive.  Musculoskeletal: no clubbing / cyanosis. No joint deformity upper and lower extremities. Good ROM, no contractures. Normal muscle tone.  Skin: no rashes, lesions, ulcers. No induration Neurologic: CN 2-12 grossly intact. Sensation intact, DTR normal. Strength 5/5 in all 4.  Psychiatric: Normal judgment and insight. Alert and oriented x 3. Normal mood.   Labs on Admission: I have personally reviewed following labs and imaging studies  CBC: Recent Labs  Lab 03/06/22 0546  WBC 6.5  HGB 11.9*  HCT 35.0*  MCV 95.6  PLT 220   Basic Metabolic Panel: Recent Labs  Lab 03/01/22 1242 03/06/22 0546  NA 136 133*  K 3.7 2.8*  CL  93* 95*  CO2 20 21*  GLUCOSE 62* 86  BUN 5* 7  CREATININE 0.61 0.80  CALCIUM 8.8 8.4*  MG  --  1.7   GFR: Estimated Creatinine Clearance: 79.7 mL/min (by C-G formula based on SCr of 0.8 mg/dL). Liver Function Tests: Recent Labs  Lab 03/01/22 1242  AST 21  ALT 8  ALKPHOS 66  BILITOT 1.0  PROT 6.7  ALBUMIN 3.6*   No results for input(s): "LIPASE", "AMYLASE" in the last 168 hours. No results for input(s): "AMMONIA" in the last 168 hours. Coagulation Profile: No results for input(s): "INR", "PROTIME" in the last 168 hours. Cardiac Enzymes: No results for input(s): "CKTOTAL", "CKMB", "CKMBINDEX", "TROPONINI" in the last 168 hours. BNP (last 3 results) No results for input(s): "PROBNP" in the last 8760 hours. HbA1C: No results  for input(s): "HGBA1C" in the last 72 hours. CBG: Recent Labs  Lab 03/06/22 0555  GLUCAP 89   Lipid Profile: No results for input(s): "CHOL", "HDL", "LDLCALC", "TRIG", "CHOLHDL", "LDLDIRECT" in the last 72 hours. Thyroid Function Tests: No results for input(s): "TSH", "T4TOTAL", "FREET4", "T3FREE", "THYROIDAB" in the last 72 hours. Anemia Panel: No results for input(s): "VITAMINB12", "FOLATE", "FERRITIN", "TIBC", "IRON", "RETICCTPCT" in the last 72 hours. Urine analysis:    Component Value Date/Time   COLORURINE YELLOW 03/06/2022 0546   APPEARANCEUR CLEAR 03/06/2022 0546   APPEARANCEUR Clear 11/03/2021 1613   LABSPEC 1.030 03/06/2022 0546   PHURINE 6.0 03/06/2022 0546   GLUCOSEU NEGATIVE 03/06/2022 0546   HGBUR MODERATE (A) 03/06/2022 0546   BILIRUBINUR NEGATIVE 03/06/2022 0546   BILIRUBINUR Negative 11/03/2021 1613   KETONESUR 20 (A) 03/06/2022 0546   PROTEINUR NEGATIVE 03/06/2022 0546   NITRITE NEGATIVE 03/06/2022 0546   LEUKOCYTESUR TRACE (A) 03/06/2022 0546    Radiological Exams on Admission: CT Angio Chest Pulmonary Embolism (PE) W or WO Contrast  Result Date: 03/06/2022 CLINICAL DATA:  Syncope.  Weakness. EXAM: CT ANGIOGRAPHY  CHEST CT ABDOMEN AND PELVIS WITH CONTRAST TECHNIQUE: Multidetector CT imaging of the chest was performed using the standard protocol during bolus administration of intravenous contrast. Multiplanar CT image reconstructions and MIPs were obtained to evaluate the vascular anatomy. Multidetector CT imaging of the abdomen and pelvis was performed using the standard protocol during bolus administration of intravenous contrast. RADIATION DOSE REDUCTION: This exam was performed according to the departmental dose-optimization program which includes automated exposure control, adjustment of the mA and/or kV according to patient size and/or use of iterative reconstruction technique. CONTRAST:  170m OMNIPAQUE IOHEXOL 350 MG/ML SOLN COMPARISON:  11/13/2021 FINDINGS: CTA CHEST FINDINGS Cardiovascular: Satisfactory opacification of the pulmonary arteries to the segmental level. Subsegmental branching and linear pulmonary artery filling defects seen within 3 vessels of the right lung, marked on series 5. Normal heart size. No pericardial effusion. Mediastinum/Nodes: Cystic density anterior to the trachea measuring 2 cm, attributed to uncomplicated foregut cyst. Lungs/Pleura: Mosaic attenuation of the lungs, likely from small airways disease and air trapping. Musculoskeletal: No acute finding Review of the MIP images confirms the above findings. CT ABDOMEN and PELVIS FINDINGS Hepatobiliary: No focal liver abnormality.Cholelithiasis. No evidence of biliary inflammation or obstruction. Pancreas: Unremarkable. Spleen: Unremarkable. Adrenals/Urinary Tract: Negative adrenals. Mild right hydronephrosis and hydroureter due to a 4 mm UVJ calculus. Distal urothelial thickening at the right ureter is likely reactive. 2.2 cm left renal cyst. Negative bladder. Stomach/Bowel:  No obstruction. No visible bowel inflammation Vascular/Lymphatic: Notable atheromatous calcification for age affecting the aorta and iliacs. Patent appearance of the  left iliac vein stent. No mass or adenopathy. Reproductive:Stable. Other: Unchanged generalized omental reticulation and peritoneal thickening. There is chart history of peritoneal carcinomatosis. Musculoskeletal: Subjective osteopenia. Coarsened appearance of right ilium trabecule and cortex question prior radiotherapy. No radiation administered, Paget's is favored. Review of the MIP images confirms the above findings. Critical Value/emergent results were called by telephone at the time of interpretation on 03/06/2022 at 7:14 am to provider [facility], who verbally acknowledged these results. IMPRESSION: Chest CT: Scattered subsegmental pulmonary emboli in the right lung. Abdominal CT: 1. Mild right hydroureteronephrosis from a 4 mm UVJ calculus. 2. Unchanged omental disease. 3. Cholelithiasis and other chronic findings described above. Electronically Signed   By: JJorje GuildM.D.   On: 03/06/2022 07:17   CT ABDOMEN PELVIS W CONTRAST  Result Date: 03/06/2022 CLINICAL DATA:  Syncope.  Weakness. EXAM: CT ANGIOGRAPHY CHEST CT ABDOMEN AND PELVIS WITH CONTRAST TECHNIQUE: Multidetector CT imaging of the chest was performed using the standard protocol during bolus administration of intravenous contrast. Multiplanar CT image reconstructions and MIPs were obtained to evaluate the vascular anatomy. Multidetector CT imaging of the abdomen and pelvis was performed using the standard protocol during bolus administration of intravenous contrast. RADIATION DOSE REDUCTION: This exam was performed according to the departmental dose-optimization program which includes automated exposure control, adjustment of the mA and/or kV according to patient size and/or use of iterative reconstruction technique. CONTRAST:  181m OMNIPAQUE IOHEXOL 350 MG/ML SOLN COMPARISON:  11/13/2021 FINDINGS: CTA CHEST FINDINGS Cardiovascular: Satisfactory opacification of the pulmonary arteries to the segmental level. Subsegmental branching and  linear pulmonary artery filling defects seen within 3 vessels of the right lung, marked on series 5. Normal heart size. No pericardial effusion. Mediastinum/Nodes: Cystic density anterior to the trachea measuring 2 cm, attributed to uncomplicated foregut cyst. Lungs/Pleura: Mosaic attenuation of the lungs, likely from small airways disease and air trapping. Musculoskeletal: No acute finding Review of the MIP images confirms the above findings. CT ABDOMEN and PELVIS FINDINGS Hepatobiliary: No focal liver abnormality.Cholelithiasis. No evidence of biliary inflammation or obstruction. Pancreas: Unremarkable. Spleen: Unremarkable. Adrenals/Urinary Tract: Negative adrenals. Mild right hydronephrosis and hydroureter due to a 4 mm UVJ calculus. Distal urothelial thickening at the right ureter is likely reactive. 2.2 cm left renal cyst. Negative bladder. Stomach/Bowel:  No obstruction. No visible bowel inflammation Vascular/Lymphatic: Notable atheromatous calcification for age affecting the aorta and iliacs. Patent appearance of the left iliac vein stent. No mass or adenopathy. Reproductive:Stable. Other: Unchanged generalized omental reticulation and peritoneal thickening. There is chart history of peritoneal carcinomatosis. Musculoskeletal: Subjective osteopenia. Coarsened appearance of right ilium trabecule and cortex question prior radiotherapy. No radiation administered, Paget's is favored. Review of the MIP images confirms the above findings. Critical Value/emergent results were called by telephone at the time of interpretation on 03/06/2022 at 7:14 am to provider MHoly Cross Santos, who verbally acknowledged these results. IMPRESSION: Chest CT: Scattered subsegmental pulmonary emboli in the right lung. Abdominal CT: 1. Mild right hydroureteronephrosis from a 4 mm UVJ calculus. 2. Unchanged omental disease. 3. Cholelithiasis and other chronic findings described above. Electronically Signed   By: JJorje GuildM.D.   On:  03/06/2022 07:17    EKG: Independently reviewed.   Assessment/Plan Principal Problem:   Acute pulmonary embolism (HCC) Active Problems:   Right Hydroureteronephrosis   Calculus of ureterovesical junction (UVJ) Right    Essential hypertension   Dyspnea on exertion   Hyperlipidemia   Back pain with sciatica   Obesity (BMI 30.0-34.9)   [place]   [place] carcinomatosis ([place]   Vasovagal syncope   Hypokalemia   Hypocalcemia   Hyponatremia   Assessment and Plan: * Acute pulmonary embolism (HCC) - Secondary to hypercoagulable state associated with cancer -Check venous Doppler ultrasound of lower extremities to rule out DVT -Continue IV heparin infusion for least 24 hours -Check 2D echocardiogram to evaluate for right heart strain  Calculus of ureterovesical junction (UVJ) Right  - Associated with mild right hydroureteronephrosis - I reached out to urologist Dr. SFelipa Eth He recommends to treat conservatively for now as there is high chance that she will pass stone due to its size.  He recommended pain management, tamsulosin, strain urine.  Reach out to urology if there is any further problem.    Right Hydroureteronephrosis - Reportedly mild, discussed with urologist, conservative  management for now recommended, pain management, tamsulosin, strain urine  Hyponatremia - Secondary to dehydration, treating with normal saline infusion IV  Hypocalcemia - Add Tums - Add albumin to a.m. labs to calculate free calcium   Hypokalemia - Oral and IV replacement ordered, check magnesium and replace as needed  Vasovagal syncope - Treating supportively  Primary peritoneal carcinomatosis ([place]) - Currently undergoing chemotherapy with Dr. Delton Coombes -Notify oncology team of admission  Malignant neoplasm of ovary ([place]) - Currently undergoing chemotherapy with Dr. Delton Coombes  Obesity (BMI 30.0-34.9) - Lifestyle modifications advised  Back pain  with sciatica - This is chronic and feels different than the flank pain that she is experiencing at this time -Treat supportively and symptomatic management  Hyperlipidemia - Resume home atorvastatin 20 mg nightly  Dyspnea on exertion - Secondary to acute pulmonary embolus, treating supportively  Essential hypertension - Blood pressures well managed continue to follow   DVT prophylaxis: IV heparin infusion  Code Status: Full   Family Communication: husband at bedside 10/3  Disposition Plan: anticipate home   Consults called: urology called   Admission status: INP  Level of care: Telemetry Irwin Brakeman MD [facility] How to contact the [facility] Attending or Consulting provider 7A - 7P or covering provider during after hours 7P -7A, for this patient?  Check the care team in [facility] and look for a) attending/consulting [facility] provider listed and b) the [facility] team listed Log into [email or web address] and use [place]'s universal password to access. If you do not have the password, please contact the Santos operator. Locate the [facility] provider you are looking for under [facility] and page to a number that you can be directly reached. If you still have difficulty reaching the provider, please page the [facility] (Director on Call) for the Hospitalists listed on amion for assistance.   If 7PM-7AM, please contact night-coverage [email or web address] Password [email or web address]  03/06/2022, 12:15 PM

## 2022-03-06 NOTE — Assessment & Plan Note (Addendum)
-   Treating supportively - continue IVF

## 2022-03-06 NOTE — ED Triage Notes (Signed)
Pt from home after she "passed out" while on toilet. Pt reports feeling more weak than usual.

## 2022-03-07 DIAGNOSIS — E876 Hypokalemia: Secondary | ICD-10-CM | POA: Diagnosis not present

## 2022-03-07 DIAGNOSIS — N201 Calculus of ureter: Secondary | ICD-10-CM | POA: Diagnosis not present

## 2022-03-07 DIAGNOSIS — I10 Essential (primary) hypertension: Secondary | ICD-10-CM | POA: Diagnosis not present

## 2022-03-07 DIAGNOSIS — E669 Obesity, unspecified: Secondary | ICD-10-CM

## 2022-03-07 DIAGNOSIS — I2699 Other pulmonary embolism without acute cor pulmonale: Secondary | ICD-10-CM | POA: Diagnosis not present

## 2022-03-07 LAB — BASIC METABOLIC PANEL
Anion gap: 12 (ref 5–15)
BUN: <5 mg/dL — ABNORMAL LOW (ref 6–20)
CO2: 20 mmol/L — ABNORMAL LOW (ref 22–32)
Calcium: 7.6 mg/dL — ABNORMAL LOW (ref 8.9–10.3)
Chloride: 105 mmol/L (ref 98–111)
Creatinine, Ser: 0.47 mg/dL (ref 0.44–1.00)
GFR, Estimated: >60 mL/min (ref >60)
Glucose, Bld: 53 mg/dL — ABNORMAL LOW (ref 70–99)
Potassium: 3.8 mmol/L (ref 3.5–5.1)
Sodium: 137 mmol/L (ref 135–145)

## 2022-03-07 LAB — ALBUMIN: Albumin: 2.3 g/dL — ABNORMAL LOW (ref 3.5–5.0)

## 2022-03-07 LAB — MAGNESIUM: Magnesium: 2.1 mg/dL (ref 1.7–2.4)

## 2022-03-07 LAB — HEPARIN LEVEL (UNFRACTIONATED)
Heparin Unfractionated: 0.49 IU/mL (ref 0.30–0.70)
Heparin Unfractionated: 0.43 IU/mL (ref 0.30–0.70)

## 2022-03-07 LAB — GLUCOSE, CAPILLARY
Glucose-Capillary: 51 mg/dL — ABNORMAL LOW (ref 70–99)
Glucose-Capillary: 58 mg/dL — ABNORMAL LOW (ref 70–99)
Glucose-Capillary: 79 mg/dL (ref 70–99)

## 2022-03-07 MED ORDER — CHLORHEXIDINE GLUCONATE CLOTH 2 % EX PADS
6.0000 | MEDICATED_PAD | Freq: Every day | CUTANEOUS | Status: DC
  Administered 2022-03-07: 6 via TOPICAL

## 2022-03-07 MED ORDER — APIXABAN 5 MG PO TABS: 5.0000 mg | ORAL_TABLET | Freq: Two times a day (BID) | ORAL | Status: DC

## 2022-03-07 MED ORDER — APIXABAN 5 MG PO TABS
10.0000 mg | ORAL_TABLET | Freq: Two times a day (BID) | ORAL | Status: DC
  Administered 2022-03-07: 10 mg via ORAL
  Filled 2022-03-07: qty 2

## 2022-03-07 MED ORDER — HEPARIN SOD (PORK) LOCK FLUSH 100 UNIT/ML IV SOLN
500.0000 [IU] | Freq: Once | INTRAVENOUS | Status: DC
  Filled 2022-03-07: qty 5

## 2022-03-07 MED ORDER — APIXABAN 5 MG PO TABS: 10.0000 mg | ORAL_TABLET | Freq: Two times a day (BID) | ORAL | 1 refills | Status: DC

## 2022-03-07 NOTE — Progress Notes (Signed)
  Transition of Care (TOC) Screening Note   Patient Details  Name: Nicole Santos Date of Birth: January 24, 1971   Transition of Care [facility]) CM/SW Contact:    Ihor Gully, LCSW Phone Number: 03/07/2022, 10:24 AM    Transition of Care Department [facility]) has reviewed patient and no TOC needs have been identified at this time. We will continue to monitor patient advancement through interdisciplinary progression rounds. If new patient transition needs arise, please place a TOC consult.

## 2022-03-07 NOTE — Discharge Summary (Signed)
Physician Discharge Summary   Patient: Nicole Santos Santos MRN: [id number] DOB: 1970-09-05  Admit date:     03/06/2022  Discharge date: 03/07/22  Discharge Physician: Nicole Santos Santos   PCP: Nicole Brooklyn, FNP   Recommendations at discharge:   Please follow up with primary care provider within 1-2 weeks  Please repeat BMP and CBC in one week   Hospital Course: 51 year old female with history of ovarian cancer, abdominal carcinomatosis  with remote history of cervical cancer treated with chemotherapy and radiation.  currently on chemotherapy, last chemo treatment 2 months ago, depression anxiety, type 2 diabetes mellitus, hypertension and recently discharge for recurrent syncopal episodes and dehydration presented to the ED with complaint of syncope.  Patient reports that she passed out while having a bowel movement on the toilet.  She has been feeling weaker than usual.  She has had intermittent shortness of breath.  She has no chest pain symptoms.  She is reporting right flank pain.  She is tired and fatigued.  No edema in the legs.  She was sent for CT chest and abd with findings of 6 multiple subsegmental right-sided pulmonary emboli and also kidney stone in the right ureter and mild hydroureteronephrosis on the right side.  She was noted to be dehydrated and have electrolyte abnormalities.  She was started on an IV heparin infusion and admission was requested.  Assessment and Plan: * Acute pulmonary embolism (Nicole Santos Santos) - Secondary to hypercoagulable state associated with cancer -Check venous Doppler ultrasound of lower extremities to rule out DVT -Started IV heparin infusion>>d/c home with apixaban  -03/06/22 2D echo--EF 55-60%, no WMA, normal RVF  Calculus of ureterovesical junction (UVJ) Right  - Associated with mild right hydroureteronephrosis -Dr. Wynetta Santos spoke with urologist Nicole Santos Santos. He recommends to treat conservatively for now as there is high chance that she will pass stone due to its size.   He recommended pain management, tamsulosin, strain urine.  Reach out to urology if there is any further problem.   -03/07/22--suspect pt has expelled stone as her pain is completely gone at time of dc  Right Hydroureteronephrosis - Reportedly mild, discussed with urology (Nicole Santos Santos), conservative management for now recommended, pain management, tamsulosin, strain urine  Hyponatremia - Secondary to dehydration, treating with normal saline infusion IV -improved with IVF  Hypocalcemia - Add Tums - Add albumin to a.m. labs to calculate free calcium - 10/4 corrected calcium 9.0   Hypokalemia - Oral and IV replacement ordered, check magnesium and replace as needed  Vasovagal syncope - Treating supportively - continue IVF  Primary peritoneal carcinomatosis (Nicole Santos Santos) - Currently undergoing chemotherapy with Dr. Delton Santos -Notifed Dr. Delton Santos of admission  Malignant neoplasm of ovary [facility]) - follow with Dr. Delton Santos and Dr. Jeral Santos -had been scheduled for interval debulking surgery in the stage of metastatic GYN malignancy, but concerned about pt's fitness for surgery due to weakness  Obesity (BMI 30.0-34.9) - Lifestyle modifications advised - BMI 32.26  Back pain with sciatica - This is chronic and feels different than the flank pain that she is experiencing at this time -Treat supportively and symptomatic management  Hyperlipidemia - Resume home atorvastatin 20 mg nightly  Dyspnea on exertion - Secondary to acute pulmonary embolus, treating supportively - stable on RA  Essential hypertension - Blood pressures well managed continue to follow         Consultants: none Procedures performed: none  Disposition: Home Diet recommendation:  Regular diet DISCHARGE MEDICATION: Allergies as of 03/07/2022  Reactions   Augmentin [amoxicillin-pot Clavulanate] Diarrhea, Nausea And Vomiting   Doxycycline    Abdominal cramps.   Topamax [topiramate] Nausea And  Vomiting        Medication List     STOP taking these medications    aspirin EC 81 MG tablet   Beneprotein Pack   cephALEXin 500 MG capsule Commonly known as: KEFLEX   Lemon-Glycerin Swab   metoprolol succinate 25 MG 24 hr tablet Commonly known as: TOPROL-XL   mirtazapine 15 MG tablet Commonly known as: Remeron       TAKE these medications    apixaban 5 MG Tabs tablet Commonly known as: ELIQUIS Take 2 tablets (10 mg total) by mouth 2 (two) times daily. Then on 03/14/22, take 1 tab (5 mg) two times daily.   atorvastatin 20 MG tablet Commonly known as: LIPITOR Take 1 tablet (20 mg total) by mouth daily. What changed: when to take this   magnesium oxide 400 (240 Mg) MG tablet Commonly known as: MAG-OX Take 1 tablet (400 mg total) by mouth 2 (two) times daily. What changed: when to take this   Potassium 99 MG Tabs Take 99 mg by mouth at bedtime.        Discharge Exam: Filed Weights   03/06/22 0541  Weight: 80 kg   HEENT:  Nicole Santos Santos/AT, No thrush, no icterus CV:  RRR, no rub, no S3, no S4 Lung:  CTA, no wheeze, no rhonchi Abd:  soft/+BS, NT Ext:  No edema, no lymphangitis, no synovitis, no rash   Condition at discharge: stable  The results of significant diagnostics from this hospitalization (including imaging, microbiology, ancillary and laboratory) are listed below for reference.   Imaging Studies: ECHOCARDIOGRAM LIMITED  Result Date: 03/06/2022    ECHOCARDIOGRAM LIMITED REPORT   Patient Name:   Nicole Santos Santos Date of Exam: 03/06/2022 Medical Rec #:  [id number]   Height:       62.0 in Accession #:    [id number]  Weight:       176.4 lb Date of Birth:  February 04, 1971    BSA:          1.812 m Patient Age:    85 years    BP:           116/74 mmHg Patient Gender: F           HR:           69 bpm. Exam [facility] Procedure: Limited Echo Indications:    Pulmonary Embolus I26.09  History:        Patient has prior history of Echocardiogram examinations, most                  recent 02/17/2022. Signs/Symptoms:Dyspnea and Syncope; Risk                 Factors:Hypertension, Dyslipidemia and Diabetes.  Sonographer:    Nicole Santos Santos RCS Referring Phys: Britton  1. Left ventricular ejection fraction, by estimation, is 55 to 60%. The left ventricle has normal function. The left ventricle has no regional wall motion abnormalities.  2. Right ventricular systolic function is normal. The right ventricular size is normal.  3. The inferior vena cava is normal in size with greater than 50% respiratory variability, suggesting right atrial pressure of 3 mmHg.  4. Limited echo to evaluate LV and RV function FINDINGS  Left Ventricle: Left ventricular ejection fraction, by estimation, is 55 to 60%. The left ventricle  has normal function. The left ventricle has no regional wall motion abnormalities. There is no left ventricular hypertrophy. Right Ventricle: The right ventricular size is normal. Right vetricular wall thickness was not well visualized. Right ventricular systolic function is normal. Venous: The inferior vena cava is normal in size with greater than 50% respiratory variability, suggesting right atrial pressure of 3 mmHg. LEFT VENTRICLE PLAX 2D LVIDd:         4.60 cm LVIDs:         2.90 cm LV PW:         0.70 cm LV IVS:        0.80 cm LVOT diam:     1.90 cm LVOT Area:     2.84 cm  LEFT ATRIUM         Index LA diam:    3.00 cm 1.66 cm/m   AORTA Ao Root diam: 2.90 cm  SHUNTS Systemic Diam: 1.90 cm Carlyle Dolly MD Electronically signed by Carlyle Dolly MD Signature Date/Time: 03/06/2022/4:10:58 PM    Final    US Venous Img Lower Bilateral (DVT)  Result Date: 03/06/2022 CLINICAL DATA:  Shortness of breath with syncope. EXAM: BILATERAL LOWER EXTREMITY VENOUS DOPPLER ULTRASOUND TECHNIQUE: Gray-scale sonography with graded compression, as well as color Doppler and duplex ultrasound were performed to evaluate the lower extremity deep venous systems from the  level of the common femoral vein and including the common femoral, femoral, profunda femoral, popliteal and calf veins including the posterior tibial, peroneal and gastrocnemius veins when visible. The superficial great saphenous vein was also interrogated. Spectral Doppler was utilized to evaluate flow at rest and with distal augmentation maneuvers in the common femoral, femoral and popliteal veins. COMPARISON:  CT abdomen and pelvis 03/06/2022 FINDINGS: RIGHT LOWER EXTREMITY Common Femoral Vein: No evidence of thrombus. Normal compressibility, respiratory phasicity and response to augmentation. Saphenofemoral Junction: No evidence of thrombus. Normal compressibility and flow on color Doppler imaging. Profunda Femoral Vein: No evidence of thrombus. Normal compressibility and flow on color Doppler imaging. Femoral Vein: No evidence of thrombus. Normal compressibility, respiratory phasicity and response to augmentation. Popliteal Vein: No evidence of thrombus. Normal compressibility, respiratory phasicity and response to augmentation. Calf Veins: No evidence of thrombus. Normal compressibility and flow on color Doppler imaging. LEFT LOWER EXTREMITY Common Femoral Vein: No evidence of thrombus. Normal compressibility, respiratory phasicity and response to augmentation. Saphenofemoral Junction: No evidence of thrombus. Normal compressibility and flow on color Doppler imaging. Profunda Femoral Vein: No evidence of thrombus. Normal compressibility and flow on color Doppler imaging. Femoral Vein: No evidence of thrombus. Normal compressibility, respiratory phasicity and response to augmentation. Popliteal Vein: No evidence of thrombus. Normal compressibility, respiratory phasicity and response to augmentation. Calf Veins: No evidence of thrombus. Normal compressibility and flow on color Doppler imaging. Other Findings:  None. IMPRESSION: No evidence of deep venous thrombosis in either lower extremity. Electronically  Signed   By: Markus Daft M.D.   On: 03/06/2022 14:56   CT Angio Chest Pulmonary Embolism (PE) W or WO Contrast  Result Date: 03/06/2022 CLINICAL DATA:  Syncope.  Weakness. EXAM: CT ANGIOGRAPHY CHEST CT ABDOMEN AND PELVIS WITH CONTRAST TECHNIQUE: Multidetector CT imaging of the chest was performed using the standard protocol during bolus administration of intravenous contrast. Multiplanar CT image reconstructions and MIPs were obtained to evaluate the vascular anatomy. Multidetector CT imaging of the abdomen and pelvis was performed using the standard protocol during bolus administration of intravenous contrast. RADIATION DOSE REDUCTION: This exam was performed according  to the departmental dose-optimization program which includes automated exposure control, adjustment of the mA and/or kV according to patient size and/or use of iterative reconstruction technique. CONTRAST:  159m OMNIPAQUE IOHEXOL 350 MG/ML SOLN COMPARISON:  11/13/2021 FINDINGS: CTA CHEST FINDINGS Cardiovascular: Satisfactory opacification of the pulmonary arteries to the segmental level. Subsegmental branching and linear pulmonary artery filling defects seen within 3 vessels of the right lung, marked on series 5. Normal heart size. No pericardial effusion. Mediastinum/Nodes: Cystic density anterior to the trachea measuring 2 cm, attributed to uncomplicated foregut cyst. Lungs/Pleura: Mosaic attenuation of the lungs, likely from small airways disease and air trapping. Musculoskeletal: No acute finding Review of the MIP images confirms the above findings. CT ABDOMEN and PELVIS FINDINGS Hepatobiliary: No focal liver abnormality.Cholelithiasis. No evidence of biliary inflammation or obstruction. Pancreas: Unremarkable. Spleen: Unremarkable. Adrenals/Urinary Tract: Negative adrenals. Mild right hydronephrosis and hydroureter due to a 4 mm UVJ calculus. Distal urothelial thickening at the right ureter is likely reactive. 2.2 cm left renal cyst.  Negative bladder. Stomach/Bowel:  No obstruction. No visible bowel inflammation Vascular/Lymphatic: Notable atheromatous calcification for age affecting the aorta and iliacs. Patent appearance of the left iliac vein stent. No mass or adenopathy. Reproductive:Stable. Other: Unchanged generalized omental reticulation and peritoneal thickening. There is chart history of peritoneal carcinomatosis. Musculoskeletal: Subjective osteopenia. Coarsened appearance of right ilium trabecule and cortex question prior radiotherapy. No radiation administered, Paget's is favored. Review of the MIP images confirms the above findings. Critical Value/emergent results were called by telephone at the time of interpretation on 03/06/2022 at 7:14 am [facility] verbally acknowledged these results. IMPRESSION: Chest CT: Scattered subsegmental pulmonary emboli in the right lung. Abdominal CT: 1. Mild right hydroureteronephrosis from a 4 mm UVJ calculus. 2. Unchanged omental disease. 3. Cholelithiasis and other chronic findings described above. Electronically Signed   By: JJorje GuildM.D.   On: 03/06/2022 07:17   CT ABDOMEN PELVIS W CONTRAST  Result Date: 03/06/2022 CLINICAL DATA:  Syncope.  Weakness. EXAM: CT ANGIOGRAPHY CHEST CT ABDOMEN AND PELVIS WITH CONTRAST TECHNIQUE: Multidetector CT imaging of the chest was performed using the standard protocol during bolus administration of intravenous contrast. Multiplanar CT image reconstructions and MIPs were obtained to evaluate the vascular anatomy. Multidetector CT imaging of the abdomen and pelvis was performed using the standard protocol during bolus administration of intravenous contrast. RADIATION DOSE REDUCTION: This exam was performed according to the departmental dose-optimization program which includes automated exposure control, adjustment of the mA and/or kV according to patient size and/or use of iterative reconstruction technique. CONTRAST:  1041mOMNIPAQUE  IOHEXOL 350 MG/ML SOLN COMPARISON:  11/13/2021 FINDINGS: CTA CHEST FINDINGS Cardiovascular: Satisfactory opacification of the pulmonary arteries to the segmental level. Subsegmental branching and linear pulmonary artery filling defects seen within 3 vessels of the right lung, marked on series 5. Normal heart size. No pericardial effusion. Mediastinum/Nodes: Cystic density anterior to the trachea measuring 2 cm, attributed to uncomplicated foregut cyst. Lungs/Pleura: Mosaic attenuation of the lungs, likely from small airways disease and air trapping. Musculoskeletal: No acute finding Review of the MIP images confirms the above findings. CT ABDOMEN and PELVIS FINDINGS Hepatobiliary: No focal liver abnormality.Cholelithiasis. No evidence of biliary inflammation or obstruction. Pancreas: Unremarkable. Spleen: Unremarkable. Adrenals/Urinary Tract: Negative adrenals. Mild right hydronephrosis and hydroureter due to a 4 mm UVJ calculus. Distal urothelial thickening at the right ureter is likely reactive. 2.2 cm left renal cyst. Negative bladder. Stomach/Bowel:  No obstruction. No visible bowel inflammation Vascular/Lymphatic: Notable atheromatous  calcification for age affecting the aorta and iliacs. Patent appearance of the left iliac vein stent. No mass or adenopathy. Reproductive:Stable. Other: Unchanged generalized omental reticulation and peritoneal thickening. There is chart history of peritoneal carcinomatosis. Musculoskeletal: Subjective osteopenia. Coarsened appearance of right ilium trabecule and cortex question prior radiotherapy. No radiation administered, Paget's is favored. Review of the MIP images confirms the above findings. Critical Value/emergent results were called by telephone at the time of interpretation on 03/06/2022 at 7:14 am to provider [facility] , who verbally acknowledged these results. IMPRESSION: Chest CT: Scattered subsegmental pulmonary emboli in the right lung. Abdominal CT: 1. Mild right  hydroureteronephrosis from a 4 mm UVJ calculus. 2. Unchanged omental disease. 3. Cholelithiasis and other chronic findings described above. Electronically Signed   By: Jorje Guild M.D.   On: 03/06/2022 07:17   ECHOCARDIOGRAM COMPLETE  Result Date: 02/17/2022    ECHOCARDIOGRAM REPORT   Patient Name:   Yesenia F Macnair Date of Exam: 02/17/2022 Medical Rec #:  [id number]   Height:       62.0 in Accession #:    [id number]  Weight:       189.4 lb Date of Birth:  November 21, 1970    BSA:          1.868 m Patient Age:    33 years    BP:           88/58 mmHg Patient Gender: F           HR:           83 bpm. Exam Location[facility] Procedure: 2D Echo, Cardiac Doppler and Color Doppler Indications:    Syncope  History:        Patient has prior history of Echocardiogram examinations, most                 recent 09/17/2016. Signs/Symptoms:Dyspnea and Syncope; Risk                 Factors:Hypertension and Dyslipidemia.  Sonographer:    Wenda Low Referring Phys: [id number] ASIA B [place]  1. Left ventricular ejection fraction, by estimation, is 60 to 65%. The left ventricle has normal function. The left ventricle has no regional wall motion abnormalities. Left ventricular diastolic parameters were normal.  2. Right ventricular systolic function is normal. The right ventricular size is mildly enlarged. There is mildly elevated pulmonary artery systolic pressure. The estimated right ventricular systolic pressure is 75.1 mmHg.  3. The mitral valve is grossly normal. Trivial mitral valve regurgitation.  4. The aortic valve is tricuspid. Aortic valve regurgitation is trivial. Aortic valve sclerosis is present, with no evidence of aortic valve stenosis. Aortic valve mean gradient measures 5.0 mmHg.  5. The inferior vena cava is normal in size with <50% respiratory variability, suggesting right atrial pressure of 8 mmHg. Comparison(s): Prior images unable to be directly viewed, comparison made by report only. FINDINGS   Left Ventricle: Left ventricular ejection fraction, by estimation, is 60 to 65%. The left ventricle has normal function. The left ventricle has no regional wall motion abnormalities. The left ventricular internal cavity size was normal in size. There is  no left ventricular hypertrophy. Left ventricular diastolic parameters were normal. Right Ventricle: The right ventricular size is mildly enlarged. No increase in right ventricular wall thickness. Right ventricular systolic function is normal. There is mildly elevated pulmonary artery systolic pressure. The tricuspid regurgitant velocity is 2.73 m/s, and with an assumed right atrial pressure of 8  mmHg, the estimated right ventricular systolic pressure is 91.4 mmHg. Left Atrium: Left atrial size was normal in size. Right Atrium: Right atrial size was normal in size. Pericardium: Trivial pericardial effusion is present. The pericardial effusion is posterior to the left ventricle. Presence of epicardial fat layer. Mitral Valve: The mitral valve is grossly normal. Trivial mitral valve regurgitation. MV peak gradient, 7.1 mmHg. The mean mitral valve gradient is 3.0 mmHg. Tricuspid Valve: The tricuspid valve is grossly normal. Tricuspid valve regurgitation is mild. Aortic Valve: The aortic valve is tricuspid. There is mild aortic valve annular calcification. Aortic valve regurgitation is trivial. Aortic valve sclerosis is present, with no evidence of aortic valve stenosis. Aortic valve mean gradient measures 5.0 mmHg. Aortic valve peak gradient measures 12.8 mmHg. Aortic valve area, by VTI measures 2.16 cm. Pulmonic Valve: The pulmonic valve was grossly normal. Pulmonic valve regurgitation is trivial. Aorta: The aortic root is normal in size and structure. Venous: The inferior vena cava is normal in size with less than 50% respiratory variability, suggesting right atrial pressure of 8 mmHg. IAS/Shunts: No atrial level shunt detected by color flow Doppler.  LEFT VENTRICLE  PLAX 2D LVIDd:         4.60 cm   Diastology LVIDs:         2.70 cm   LV e' medial:    9.46 cm/s LV PW:         0.80 cm   LV E/e' medial:  12.4 LV IVS:        0.90 cm   LV e' lateral:   9.57 cm/s LVOT diam:     2.00 cm   LV E/e' lateral: 12.2 LV SV:         72 LV SV Index:   39 LVOT Area:     3.14 cm  RIGHT VENTRICLE RV Basal diam:  2.90 cm RV Mid diam:    4.00 cm RV S prime:     10.30 cm/s TAPSE (M-mode): 2.3 cm LEFT ATRIUM             Index        RIGHT ATRIUM           Index LA diam:        3.20 cm 1.71 cm/m   RA Area:     12.70 cm LA Vol (A2C):   38.5 ml 20.61 ml/m  RA Volume:   30.40 ml  16.28 ml/m LA Vol (A4C):   34.0 ml 18.20 ml/m LA Biplane Vol: 38.7 ml 20.72 ml/m  AORTIC VALVE                     PULMONIC VALVE AV Area (Vmax):    2.05 cm      PV Vmax:       1.02 m/s AV Area (Vmean):   2.24 cm      PV Peak grad:  4.2 mmHg AV Area (VTI):     2.16 cm AV Vmax:           179.00 cm/s AV Vmean:          104.000 cm/s AV VTI:            0.333 m AV Peak Grad:      12.8 mmHg AV Mean Grad:      5.0 mmHg LVOT Vmax:         117.00 cm/s LVOT Vmean:        74.000 cm/s LVOT VTI:  0.229 m LVOT/AV VTI ratio: 0.69  AORTA Ao Root diam: 2.90 cm MITRAL VALVE                TRICUSPID VALVE MV Area (PHT): 4.57 cm     TR Peak grad:   29.8 mmHg MV Area VTI:   2.23 cm     TR Vmax:        273.00 cm/s MV Peak grad:  7.1 mmHg MV Mean grad:  3.0 mmHg     SHUNTS MV Vmax:       1.33 m/s     Systemic VTI:  0.23 m MV Vmean:      74.9 cm/s    Systemic Diam: 2.00 cm MV Decel Time: 166 msec MV E velocity: 117.00 cm/s MV A velocity: 79.70 cm/s MV E/A ratio:  1.47 Rozann Lesches MD Electronically signed by Rozann Lesches MD Signature Date/Time: 02/17/2022/1:38:30 PM    Final    DG Chest Port 1 View  Result Date: 02/16/2022 CLINICAL DATA:  Possible sepsis, unwitnessed fall. EXAM: PORTABLE CHEST 1 VIEW COMPARISON:  11/15/2021. FINDINGS: The heart size and mediastinal contours are within normal limits. No consolidation,  effusion, or pneumothorax. Old healed rib fractures are noted bilaterally. A left chest port terminates over the junction of the brachiocephalic vein and superior vena cava, slightly retracted as compared with the prior exam. IMPRESSION: No active disease. Electronically Signed   By: Brett Fairy M.D.   On: 02/16/2022 23:40    Microbiology: Results for orders placed or performed during the hospital encounter of 02/16/22  Blood Culture (routine x 2)     Status: None   Collection Time: 02/16/22 11:05 PM   Specimen: Site Not Specified; Blood  Result Value Ref Range Status   Specimen Description   Final    SITE NOT SPECIFIED BOTTLES DRAWN AEROBIC AND ANAEROBIC   Special Requests Blood Culture adequate volume  Final   Culture   Final    NO GROWTH 5 DAYS Performed at [facility], [address], [place], [place] [address]    Report Status 02/21/2022 FINAL  Final  Blood Culture (routine x 2)     Status: None   Collection Time: 02/16/22 11:10 PM   Specimen: BLOOD RIGHT FOREARM  Result Value Ref Range Status   Specimen Description   Final    BLOOD RIGHT FOREARM BOTTLES DRAWN AEROBIC AND ANAEROBIC   Special Requests Blood Culture adequate volume  Final   Culture   Final    NO GROWTH 5 DAYS Performed at [facility], [address] [place], [place] [address]    Report Status 02/21/2022 FINAL  Final  Urine Culture     Status: None   Collection Time: 02/18/22  3:57 PM   Specimen: Urine, Clean Catch  Result Value Ref Range Status   Specimen Description   Final    URINE, CLEAN CATCH Performed at [facility], [address], [place], [place] [address]    Special Requests   Final    NONE Performed at [facility], [address], [place],  [address]    Culture   Final    NO GROWTH Performed at [facility], [place] [address], [place],  [address]    Report Status 02/20/2022 FINAL  Final    Labs: CBC: Recent Labs  Lab 03/06/22 0546 03/07/22 0320  WBC 6.5 3.2*  HGB  11.9* 9.2*  HCT 35.0* 27.2*  MCV 95.6 97.1  PLT 183 166   Basic  Metabolic Panel: Recent Labs  Lab 03/01/22 1242 03/06/22 0546 03/07/22 0320  NA 136 133* 137  K 3.7 2.8* 3.8  CL 93* 95* 105  CO2 20 21* 20*  GLUCOSE 62* 86 53*  BUN 5* 7 <5*  CREATININE 0.61 0.80 0.47  CALCIUM 8.8 8.4* 7.6*  MG  --  1.7 2.1   Liver Function Tests: Recent Labs  Lab 03/01/22 1242 03/07/22 0320  AST 21  --   ALT 8  --   ALKPHOS 66  --   BILITOT 1.0  --   PROT 6.7  --   ALBUMIN 3.6* 2.3*   CBG: Recent Labs  Lab 03/06/22 0555 03/07/22 0629 03/07/22 0705 03/07/22 0751  GLUCAP 89 51* 58* 79    Discharge time spent: greater than 30 minutes.  Signed: Orson Eva, MD [facility] 03/07/2022

## 2022-03-07 NOTE — Progress Notes (Signed)
Patient blood sugar 51. Patient A/ox4. Showing no symptoms. Patient given orange juice. Will recheck.

## 2022-03-07 NOTE — Progress Notes (Signed)
PROGRESS NOTE  Nicole Santos IRW:[id number] DOB: 13-Dec-1970 DOA: 03/06/2022 PCP: Loman Brooklyn, FNP  Brief History:  51 year old female with history of ovarian cancer, abdominal carcinomatosis  with remote history of cervical cancer treated with chemotherapy and radiation.  currently on chemotherapy, last chemo treatment 2 months ago, depression anxiety, type 2 diabetes mellitus, hypertension and recently discharge for recurrent syncopal episodes and dehydration presented to the ED with complaint of syncope.  Patient reports that she passed out while having a bowel movement on the toilet.  She has been feeling weaker than usual.  She has had intermittent shortness of breath.  She has no chest pain symptoms.  She is reporting right flank pain.  She is tired and fatigued.  No edema in the legs.  She was sent for CT chest and abd with findings of 6 multiple subsegmental right-sided pulmonary emboli and also kidney stone in the right ureter and mild hydroureteronephrosis on the right side.  She was noted to be dehydrated and have electrolyte abnormalities.  She was started on an IV heparin infusion and admission was requested.   Assessment/Plan:    Assessment and Plan: * Acute pulmonary embolism ([place]) - Secondary to hypercoagulable state associated with cancer -Check venous Doppler ultrasound of lower extremities to rule out DVT -Continue IV heparin infusion for least 24 hours -03/06/22 2D echo--EF 55-60%, no WMA, normal RVF  Calculus of ureterovesical junction (UVJ) Right  - Associated with mild right hydroureteronephrosis -Dr. Wynetta Emery spoke with urologist Dr. Felipa Eth. He recommends to treat conservatively for now as there is high chance that she will pass stone due to its size.  He recommended pain management, tamsulosin, strain urine.  Reach out to urology if there is any further problem.    Right Hydroureteronephrosis - Reportedly mild, discussed with urology ([place]),  conservative management for now recommended, pain management, tamsulosin, strain urine  Hyponatremia - Secondary to dehydration, treating with normal saline infusion IV  Hypocalcemia - Add Tums - Add albumin to a.m. labs to calculate free calcium - 10/4 corrected calcium 9.0   Hypokalemia - Oral and IV replacement ordered, check magnesium and replace as needed  Vasovagal syncope - Treating supportively - continue IVF  Primary peritoneal carcinomatosis ([place]) - Currently undergoing chemotherapy with Dr. Delton Coombes -Notifed Dr. Delton Coombes of admission  Malignant neoplasm of ovary ([place]) - follow with Dr. Delton Coombes and Dr. Jeral Pinch -had been scheduled for interval debulking surgery in the stage of metastatic GYN malignancy, but concerned about pt's fitness for surgery due to weakness  Obesity (BMI 30.0-34.9) - Lifestyle modifications advised - BMI 32.26  Back pain with sciatica - This is chronic and feels different than the flank pain that she is experiencing at this time -Treat supportively and symptomatic management  Hyperlipidemia - Resume home atorvastatin 20 mg nightly  Dyspnea on exertion - Secondary to acute pulmonary embolus, treating supportively - stable on RA  Essential hypertension - Blood pressures well managed continue to follow      Family Communication:   no Family at bedside  Consultants:  med onc  Code Status:  FULL   DVT Prophylaxis:  IV Heparin    Procedures: As Listed in Progress Note Above  Antibiotics: None  RN Pressure Injury Documentation:        Subjective:   Objective: Vitals:   03/06/22 2016 03/06/22 2332 03/07/22 0328 03/07/22 0447  BP: 115/61 100/63 (!) 94/59 95/60  Pulse: 73 88 83  90  Resp: '19 18 18   '$ Temp: (!) 97 F (36.1 C) (!) 97.2 F (36.2 C) (!) 97.4 F (36.3 C)   TempSrc:      SpO2: 100% 100% 97%   Weight:      Height:        Intake/Output Summary (Last 24 hours) at 03/07/2022 0828 Last  data filed at 03/07/2022 0556 Gross per 24 hour  Intake 1823.48 ml  Output 150 ml  Net 1673.48 ml   Weight change:  Exam:  General:  Pt is alert, follows commands appropriately, not in acute distress HEENT: No icterus, No thrush, No neck mass, [place]/AT Cardiovascular: RRR, S1/S2, no rubs, no gallops Respiratory: CTA bilaterally, no wheezing, no crackles, no rhonchi Abdomen: Soft/+BS, non tender, non distended, no guarding Extremities: No edema, No lymphangitis, No petechiae, No rashes, no synovitis   Data Reviewed: I have personally reviewed following labs and imaging studies Basic Metabolic Panel: Recent Labs  Lab 03/01/22 1242 03/06/22 0546 03/07/22 0320  NA 136 133* 137  K 3.7 2.8* 3.8  CL 93* 95* 105  CO2 20 21* 20*  GLUCOSE 62* 86 53*  BUN 5* 7 <5*  CREATININE 0.61 0.80 0.47  CALCIUM 8.8 8.4* 7.6*  MG  --  1.7 2.1   Liver Function Tests: Recent Labs  Lab 03/01/22 1242 03/07/22 0320  AST 21  --   ALT 8  --   ALKPHOS 66  --   BILITOT 1.0  --   PROT 6.7  --   ALBUMIN 3.6* 2.3*   No results for input(s): "LIPASE", "AMYLASE" in the last 168 hours. No results for input(s): "AMMONIA" in the last 168 hours. Coagulation Profile: No results for input(s): "INR", "PROTIME" in the last 168 hours. CBC: Recent Labs  Lab 03/06/22 0546 03/07/22 0320  WBC 6.5 3.2*  HGB 11.9* 9.2*  HCT 35.0* 27.2*  MCV 95.6 97.1  PLT 183 166   Cardiac Enzymes: No results for input(s): "CKTOTAL", "CKMB", "CKMBINDEX", "TROPONINI" in the last 168 hours. BNP: Invalid input(s): "POCBNP" CBG: Recent Labs  Lab 03/06/22 0555 03/07/22 0629 03/07/22 0705 03/07/22 0751  GLUCAP 89 51* 58* 79   HbA1C: No results for input(s): "HGBA1C" in the last 72 hours. Urine analysis:    Component Value Date/Time   COLORURINE YELLOW 03/06/2022 0546   APPEARANCEUR CLEAR 03/06/2022 0546   APPEARANCEUR Clear 11/03/2021 1613   LABSPEC 1.030 03/06/2022 0546   PHURINE 6.0 03/06/2022 0546   GLUCOSEU  NEGATIVE 03/06/2022 0546   HGBUR MODERATE (A) 03/06/2022 0546   BILIRUBINUR NEGATIVE 03/06/2022 0546   BILIRUBINUR Negative 11/03/2021 1613   KETONESUR 20 (A) 03/06/2022 0546   PROTEINUR NEGATIVE 03/06/2022 0546   NITRITE NEGATIVE 03/06/2022 0546   LEUKOCYTESUR TRACE (A) 03/06/2022 0546   Sepsis Labs: '@LABRCNTIP'$ (procalcitonin:4,lacticidven:4) )No results found for this or any previous visit (from the past 240 hour(s)).   Scheduled Meds:  aspirin EC  81 mg Oral Q breakfast   atorvastatin  20 mg Oral QHS   calcium carbonate  1 tablet Oral TID WC   Chlorhexidine Gluconate Cloth  6 each Topical Daily   magnesium oxide  400 mg Oral QHS   tamsulosin  0.4 mg Oral QPC supper   Continuous Infusions:  0.9 % NaCl with KCl 20 mEq / L 125 mL/hr at 03/07/22 0442   heparin 900 Units/hr (03/07/22 0645)    Procedures/Studies: ECHOCARDIOGRAM LIMITED  Result Date: 03/06/2022    ECHOCARDIOGRAM LIMITED REPORT   Patient Name:  Nanette F Wessell Date of Exam: 03/06/2022 Medical Rec #:  [id number]   Height:       62.0 in Accession #:    [id number]  Weight:       176.4 lb Date of Birth:  Jul 18, 1970    BSA:          1.812 m Patient Age:    58 years    BP:           116/74 mmHg Patient Gender: F           HR:           69 bpm. Exam Location:  [facility] Procedure: Limited Echo Indications:    Pulmonary Embolus I26.09  History:        Patient has prior history of Echocardiogram examinations, most                 recent 02/17/2022. Signs/Symptoms:Dyspnea and Syncope; Risk                 Factors:Hypertension, Dyslipidemia and Diabetes.  Sonographer:    Alvino Chapel RCS Referring Phys: [place]  1. Left ventricular ejection fraction, by estimation, is 55 to 60%. The left ventricle has normal function. The left ventricle has no regional wall motion abnormalities.  2. Right ventricular systolic function is normal. The right ventricular size is normal.  3. The inferior vena cava is normal in size with  greater than 50% respiratory variability, suggesting right atrial pressure of 3 mmHg.  4. Limited echo to evaluate LV and RV function FINDINGS  Left Ventricle: Left ventricular ejection fraction, by estimation, is 55 to 60%. The left ventricle has normal function. The left ventricle has no regional wall motion abnormalities. There is no left ventricular hypertrophy. Right Ventricle: The right ventricular size is normal. Right vetricular wall thickness was not well visualized. Right ventricular systolic function is normal. Venous: The inferior vena cava is normal in size with greater than 50% respiratory variability, suggesting right atrial pressure of 3 mmHg. LEFT VENTRICLE PLAX 2D LVIDd:         4.60 cm LVIDs:         2.90 cm LV PW:         0.70 cm LV IVS:        0.80 cm LVOT diam:     1.90 cm LVOT Area:     2.84 cm  LEFT ATRIUM         Index LA diam:    3.00 cm 1.66 cm/m   AORTA Ao Root diam: 2.90 cm  SHUNTS Systemic Diam: 1.90 cm Carlyle Dolly MD Electronically signed by Carlyle Dolly MD Signature Date/Time: 03/06/2022/4:10:58 PM    Final    US Venous Img Lower Bilateral (DVT)  Result Date: 03/06/2022 CLINICAL DATA:  Shortness of breath with syncope. EXAM: BILATERAL LOWER EXTREMITY VENOUS DOPPLER ULTRASOUND TECHNIQUE: Gray-scale sonography with graded compression, as well as color Doppler and duplex ultrasound were performed to evaluate the lower extremity deep venous systems from the level of the common femoral vein and including the common femoral, femoral, profunda femoral, popliteal and calf veins including the posterior tibial, peroneal and gastrocnemius veins when visible. The superficial great saphenous vein was also interrogated. Spectral Doppler was utilized to evaluate flow at rest and with distal augmentation maneuvers in the common femoral, femoral and popliteal veins. COMPARISON:  CT abdomen and pelvis 03/06/2022 FINDINGS: RIGHT LOWER EXTREMITY Common Femoral Vein: No evidence of thrombus.  Normal  compressibility, respiratory phasicity and response to augmentation. Saphenofemoral Junction: No evidence of thrombus. Normal compressibility and flow on color Doppler imaging. Profunda Femoral Vein: No evidence of thrombus. Normal compressibility and flow on color Doppler imaging. Femoral Vein: No evidence of thrombus. Normal compressibility, respiratory phasicity and response to augmentation. Popliteal Vein: No evidence of thrombus. Normal compressibility, respiratory phasicity and response to augmentation. Calf Veins: No evidence of thrombus. Normal compressibility and flow on color Doppler imaging. LEFT LOWER EXTREMITY Common Femoral Vein: No evidence of thrombus. Normal compressibility, respiratory phasicity and response to augmentation. Saphenofemoral Junction: No evidence of thrombus. Normal compressibility and flow on color Doppler imaging. Profunda Femoral Vein: No evidence of thrombus. Normal compressibility and flow on color Doppler imaging. Femoral Vein: No evidence of thrombus. Normal compressibility, respiratory phasicity and response to augmentation. Popliteal Vein: No evidence of thrombus. Normal compressibility, respiratory phasicity and response to augmentation. Calf Veins: No evidence of thrombus. Normal compressibility and flow on color Doppler imaging. Other Findings:  None. IMPRESSION: No evidence of deep venous thrombosis in either lower extremity. Electronically Signed   By: Markus Daft M.D.   On: 03/06/2022 14:56   CT Angio Chest Pulmonary Embolism (PE) W or WO Contrast  Result Date: 03/06/2022 CLINICAL DATA:  Syncope.  Weakness. EXAM: CT ANGIOGRAPHY CHEST CT ABDOMEN AND PELVIS WITH CONTRAST TECHNIQUE: Multidetector CT imaging of the chest was performed using the standard protocol during bolus administration of intravenous contrast. Multiplanar CT image reconstructions and MIPs were obtained to evaluate the vascular anatomy. Multidetector CT imaging of the abdomen and pelvis was  performed using the standard protocol during bolus administration of intravenous contrast. RADIATION DOSE REDUCTION: This exam was performed according to the departmental dose-optimization program which includes automated exposure control, adjustment of the mA and/or kV according to patient size and/or use of iterative reconstruction technique. CONTRAST:  135m OMNIPAQUE IOHEXOL 350 MG/ML SOLN COMPARISON:  11/13/2021 FINDINGS: CTA CHEST FINDINGS Cardiovascular: Satisfactory opacification of the pulmonary arteries to the segmental level. Subsegmental branching and linear pulmonary artery filling defects seen within 3 vessels of the right lung, marked on series 5. Normal heart size. No pericardial effusion. Mediastinum/Nodes: Cystic density anterior to the trachea measuring 2 cm, attributed to uncomplicated foregut cyst. Lungs/Pleura: Mosaic attenuation of the lungs, likely from small airways disease and air trapping. Musculoskeletal: No acute finding Review of the MIP images confirms the above findings. CT ABDOMEN and PELVIS FINDINGS Hepatobiliary: No focal liver abnormality.Cholelithiasis. No evidence of biliary inflammation or obstruction. Pancreas: Unremarkable. Spleen: Unremarkable. Adrenals/Urinary Tract: Negative adrenals. Mild right hydronephrosis and hydroureter due to a 4 mm UVJ calculus. Distal urothelial thickening at the right ureter is likely reactive. 2.2 cm left renal cyst. Negative bladder. Stomach/Bowel:  No obstruction. No visible bowel inflammation Vascular/Lymphatic: Notable atheromatous calcification for age affecting the aorta and iliacs. Patent appearance of the left iliac vein stent. No mass or adenopathy. Reproductive:Stable. Other: Unchanged generalized omental reticulation and peritoneal thickening. There is chart history of peritoneal carcinomatosis. Musculoskeletal: Subjective osteopenia. Coarsened appearance of right ilium trabecule and cortex question prior radiotherapy. No radiation  administered, Paget's is favored. Review of the MIP images confirms the above findings. Critical Value/emergent results were called by telephone at the time of interpretation on 03/06/2022 at 7:14 am [facility], who verbally acknowledged these results. IMPRESSION: Chest CT: Scattered subsegmental pulmonary emboli in the right lung. Abdominal CT: 1. Mild right hydroureteronephrosis from a 4 mm UVJ calculus. 2. Unchanged omental disease. 3. Cholelithiasis and other chronic findings  described above. Electronically Signed   By: Jorje Guild M.D.   On: 03/06/2022 07:17   CT ABDOMEN PELVIS W CONTRAST  Result Date: 03/06/2022 CLINICAL DATA:  Syncope.  Weakness. EXAM: CT ANGIOGRAPHY CHEST CT ABDOMEN AND PELVIS WITH CONTRAST TECHNIQUE: Multidetector CT imaging of the chest was performed using the standard protocol during bolus administration of intravenous contrast. Multiplanar CT image reconstructions and MIPs were obtained to evaluate the vascular anatomy. Multidetector CT imaging of the abdomen and pelvis was performed using the standard protocol during bolus administration of intravenous contrast. RADIATION DOSE REDUCTION: This exam was performed according to the departmental dose-optimization program which includes automated exposure control, adjustment of the mA and/or kV according to patient size and/or use of iterative reconstruction technique. CONTRAST:  171m OMNIPAQUE IOHEXOL 350 MG/ML SOLN COMPARISON:  11/13/2021 FINDINGS: CTA CHEST FINDINGS Cardiovascular: Satisfactory opacification of the pulmonary arteries to the segmental level. Subsegmental branching and linear pulmonary artery filling defects seen within 3 vessels of the right lung, marked on series 5. Normal heart size. No pericardial effusion. Mediastinum/Nodes: Cystic density anterior to the trachea measuring 2 cm, attributed to uncomplicated foregut cyst. Lungs/Pleura: Mosaic attenuation of the lungs, likely from small airways  disease and air trapping. Musculoskeletal: No acute finding Review of the MIP images confirms the above findings. CT ABDOMEN and PELVIS FINDINGS Hepatobiliary: No focal liver abnormality.Cholelithiasis. No evidence of biliary inflammation or obstruction. Pancreas: Unremarkable. Spleen: Unremarkable. Adrenals/Urinary Tract: Negative adrenals. Mild right hydronephrosis and hydroureter due to a 4 mm UVJ calculus. Distal urothelial thickening at the right ureter is likely reactive. 2.2 cm left renal cyst. Negative bladder. Stomach/Bowel:  No obstruction. No visible bowel inflammation Vascular/Lymphatic: Notable atheromatous calcification for age affecting the aorta and iliacs. Patent appearance of the left iliac vein stent. No mass or adenopathy. Reproductive:Stable. Other: Unchanged generalized omental reticulation and peritoneal thickening. There is chart history of peritoneal carcinomatosis. Musculoskeletal: Subjective osteopenia. Coarsened appearance of right ilium trabecule and cortex question prior radiotherapy. No radiation administered, Paget's is favored. Review of the MIP images confirms the above findings. Critical Value/emergent results were called by telephone at the time of interpretation on 03/06/2022 at 7:14 am to provider [facility], who verbally acknowledged these results. IMPRESSION: Chest CT: Scattered subsegmental pulmonary emboli in the right lung. Abdominal CT: 1. Mild right hydroureteronephrosis from a 4 mm UVJ calculus. 2. Unchanged omental disease. 3. Cholelithiasis and other chronic findings described above. Electronically Signed   By: JJorje GuildM.D.   On: 03/06/2022 07:17   ECHOCARDIOGRAM COMPLETE  Result Date: 02/17/2022    ECHOCARDIOGRAM REPORT   Patient Name:   Suni F Salsbury Date of Exam: 02/17/2022 Medical Rec #:  [id number]  Height:       62.0 in Accession #:    [id number] Weight:       189.4 lb Date of Birth:  [birth year]   BSA:          1.868 m Patient Age:    89years    BP:            88/58 mmHg Patient Gender: F           HR:           83 bpm. [facility], Cardiac Doppler and Color Doppler Indications:    Syncope  History:        Patient has prior history of Echocardiogram examinations, most  recent 09/17/2016. Signs/Symptoms:Dyspnea and Syncope; Risk                 Factors:Hypertension and Dyslipidemia.  Sonographer:    Wenda Low Referring Phys: [id number] AS[facility] B [place]  1. Left ventricular ejection fraction, by estimation, is 60 to 65%. The left ventricle has normal function. The left ventricle has no regional wall motion abnormalities. Left ventricular diastolic parameters were normal.  2. Right ventricular systolic function is normal. The right ventricular size is mildly enlarged. There is mildly elevated pulmonary artery systolic pressure. The estimated right ventricular systolic pressure is 10.6 mmHg.  3. The mitral valve is grossly normal. Trivial mitral valve regurgitation.  4. The aortic valve is tricuspid. Aortic valve regurgitation is trivial. Aortic valve sclerosis is present, with no evidence of aortic valve stenosis. Aortic valve mean gradient measures 5.0 mmHg.  5. The inferior vena cava is normal in size with <50% respiratory variability, suggesting right atrial pressure of 8 mmHg. Comparison(s): Prior images unable to be directly viewed, comparison made by report only. FINDINGS  Left Ventricle: Left ventricular ejection fraction, by estimation, is 60 to 65%. The left ventricle has normal function. The left ventricle has no regional wall motion abnormalities. The left ventricular internal cavity size was normal in size. There is  no left ventricular hypertrophy. Left ventricular diastolic parameters were normal. Right Ventricle: The right ventricular size is mildly enlarged. No increase in right ventricular wall thickness. Right ventricular systolic function is normal. There is mildly elevated  pulmonary artery systolic pressure. The tricuspid regurgitant velocity is 2.73 m/s, and with an assumed right atrial pressure of 8 mmHg, the estimated right ventricular systolic pressure is 26.9 mmHg. Left Atrium: Left atrial size was normal in size. Right Atrium: Right atrial size was normal in size. Pericardium: Trivial pericardial effusion is present. The pericardial effusion is posterior to the left ventricle. Presence of epicardial fat layer. Mitral Valve: The mitral valve is grossly normal. Trivial mitral valve regurgitation. MV peak gradient, 7.1 mmHg. The mean mitral valve gradient is 3.0 mmHg. Tricuspid Valve: The tricuspid valve is grossly normal. Tricuspid valve regurgitation is mild. Aortic Valve: The aortic valve is tricuspid. There is mild aortic valve annular calcification. Aortic valve regurgitation is trivial. Aortic valve sclerosis is present, with no evidence of aortic valve stenosis. Aortic valve mean gradient measures 5.0 mmHg. Aortic valve peak gradient measures 12.8 mmHg. Aortic valve area, by VTI measures 2.16 cm. Pulmonic Valve: The pulmonic valve was grossly normal. Pulmonic valve regurgitation is trivial. Aorta: The aortic root is normal in size and structure. Venous: The inferior vena cava is normal in size with less than 50% respiratory variability, suggesting right atrial pressure of 8 mmHg. IAS/Shunts: No atrial level shunt detected by color flow Doppler.  LEFT VENTRICLE PLAX 2D LVIDd:         4.60 cm   Diastology LVIDs:         2.70 cm   LV e' medial:    9.46 cm/s LV PW:         0.80 cm   LV E/e' medial:  12.4 LV IVS:        0.90 cm   LV e' lateral:   9.57 cm/s LVOT diam:     2.00 cm   LV E/e' lateral: 12.2 LV SV:         72 LV SV Index:   39 LVOT Area:     3.14 cm  RIGHT VENTRICLE RV Basal diam:  2.90  cm RV Mid diam:    4.00 cm RV S prime:     10.30 cm/s TAPSE (M-mode): 2.3 cm LEFT ATRIUM             Index        RIGHT ATRIUM           Index LA diam:        3.20 cm 1.71 cm/m   RA  Area:     12.70 cm LA Vol (A2C):   38.5 ml 20.61 ml/m  RA Volume:   30.40 ml  16.28 ml/m LA Vol (A4C):   34.0 ml 18.20 ml/m LA Biplane Vol: 38.7 ml 20.72 ml/m  AORTIC VALVE                     PULMONIC VALVE AV Area (Vmax):    2.05 cm      PV Vmax:       1.02 m/s AV Area (Vmean):   2.24 cm      PV Peak grad:  4.2 mmHg AV Area (VTI):     2.16 cm AV Vmax:           179.00 cm/s AV Vmean:          104.000 cm/s AV VTI:            0.333 m AV Peak Grad:      12.8 mmHg AV Mean Grad:      5.0 mmHg LVOT Vmax:         117.00 cm/s LVOT Vmean:        74.000 cm/s LVOT VTI:          0.229 m LVOT/AV VTI ratio: 0.69  AORTA Ao Root diam: 2.90 cm MITRAL VALVE                TRICUSPID VALVE MV Area (PHT): 4.57 cm     TR Peak grad:   29.8 mmHg MV Area VTI:   2.23 cm     TR Vmax:        273.00 cm/s MV Peak grad:  7.1 mmHg MV Mean grad:  3.0 mmHg     SHUNTS MV Vmax:       1.33 m/s     Systemic VTI:  0.23 m MV Vmean:      74.9 cm/s    Systemic Diam: 2.00 cm MV Decel Time: 166 msec MV E velocity: 117.00 cm/s MV A velocity: 79.70 cm/s MV E/A ratio:  1.47 Rozann Lesches MD Electronically signed by Rozann Lesches MD Signature Date/Time: 02/17/2022/1:38:30 PM    Final    DG Chest Port 1 View  Result Date: 02/16/2022 CLINICAL DATA:  Possible sepsis, unwitnessed fall. EXAM: PORTABLE CHEST 1 VIEW COMPARISON:  11/15/2021. FINDINGS: The heart size and mediastinal contours are within normal limits. No consolidation, effusion, or pneumothorax. Old healed rib fractures are noted bilaterally. A left chest port terminates over the junction of the brachiocephalic vein and superior vena cava, slightly retracted as compared with the prior exam. IMPRESSION: No active disease. Electronically Signed   By: Brett Fairy M.D.   On: 02/16/2022 23:40    Orson Eva, DO  [facility]  If 7PM-7AM, please contact night-coverage [email or web address] Password [email or web address] 03/07/2022, 8:28 AM   LOS: 1 day

## 2022-03-07 NOTE — Progress Notes (Signed)
Blood sugar rechecked. 58. Day shift nurse Lauren made aware. Another cup of orange juice given. Patient refusing anything else due to poor appetite/taste buds.

## 2022-03-07 NOTE — Plan of Care (Signed)

## 2022-03-07 NOTE — Progress Notes (Signed)
ANTICOAGULATION CONSULT NOTE - Follow Up Consult  Pharmacy Consult for heparin Indication: pulmonary embolus  Labs: Recent Labs    03/06/22 0546 03/06/22 0554 03/06/22 1512 03/07/22 0022  HGB 11.9*  --   --   --   HCT 35.0*  --   --   --   PLT 183  --   --   --   HEPARINUNFRC  --   --  >1.10* 0.49  CREATININE 0.80  --   --   --   TROPONINIHS  --  2  --   --     Assessment/Plan:  51yo female therapeutic on heparin after rate change. Will continue infusion at current rate of 900 units/hr and confirm stable with additional level.   [name], PharmD, BCPS  03/07/2022,1:23 AM

## 2022-03-07 NOTE — Progress Notes (Signed)
ANTICOAGULATION CONSULT NOTE -   Pharmacy Consult for heparin Indication: pulmonary embolus  Allergies  Allergen Reactions   Augmentin [Amoxicillin-Pot Clavulanate] Diarrhea and Nausea And Vomiting   Doxycycline     Abdominal cramps.   Topamax [Topiramate] Nausea And Vomiting    Patient Measurements: Height: '5\' 2"'$  (157.5 cm) Weight: 80 kg (176 lb 5.9 oz) IBW/kg (Calculated) : 50.1 Heparin Dosing Weight: 68 kg  Vital Signs: Temp: 97.4 F (36.3 C) (10/04 0328) BP: 95/60 (10/04 0447) Pulse Rate: 90 (10/04 0447)  Labs: Recent Labs    03/06/22 0546 03/06/22 0554 03/06/22 1512 03/07/22 0022 03/07/22 0320  HGB 11.9*  --   --   --  9.2*  HCT 35.0*  --   --   --  27.2*  PLT 183  --   --   --  166  HEPARINUNFRC  --   --  >1.10* 0.49 0.43  CREATININE 0.80  --   --   --  0.47  TROPONINIHS  --  2  --   --   --      Estimated Creatinine Clearance: 79.7 mL/min (by C-G formula based on SCr of 0.47 mg/dL).   Medical History: Past Medical History:  Diagnosis Date   Acute respiratory disease due to COVID-19 virus 02/02/2019   Anxiety    Cancer ([place])    Depression    Dyspnea    Dysrhythmia    History of diabetes mellitus    Resolved in Sept 2023 with weight loss.   Hypertension    Ovarian cancer ([place]) 11/14/2021   Peritoneal carcinomatosis ([place])    Pneumonia    Sleep apnea    doesn't use CPAP   Tachycardia     Medications:  Medications Prior to Admission  Medication Sig Dispense Refill Last Dose   aspirin EC 81 MG tablet Take 1 tablet (81 mg total) by mouth daily with breakfast. 30 tablet 3 03/06/2022   atorvastatin (LIPITOR) 20 MG tablet Take 1 tablet (20 mg total) by mouth daily. (Patient taking differently: Take 20 mg by mouth at bedtime.) 90 tablet 1 03/05/2022   magnesium oxide (MAG-OX) 400 (240 Mg) MG tablet Take 1 tablet (400 mg total) by mouth 2 (two) times daily. (Patient taking differently: Take 400 mg by mouth at bedtime.) 60 tablet 2 03/06/2022   Potassium  99 MG TABS Take 99 mg by mouth at bedtime.   03/06/2022   Beneprotein PACK Take 1 Scoop (6 g total) by mouth 3 (three) times daily. (Patient not taking: Reported on 03/06/2022) 90 each 2 Not Taking   cephALEXin (KEFLEX) 500 MG capsule Take 1 capsule (500 mg total) by mouth 2 (two) times daily. (Patient not taking: Reported on 03/06/2022) 14 capsule 0 Completed Course   metoprolol succinate (TOPROL-XL) 25 MG 24 hr tablet Take 0.5 tablets (12.5 mg total) by mouth daily. (Patient not taking: Reported on 03/06/2022) 15 tablet 2 Not Taking   mirtazapine (REMERON) 15 MG tablet Take 1 tablet (15 mg total) by mouth at bedtime. (Patient not taking: Reported on 03/06/2022) 30 tablet 3 Not Taking   Misc. Throat Products (LEMON-GLYCERIN) SWAB Use as directed 1 each in the mouth or throat 5 (five) times daily. (Patient not taking: Reported on 03/06/2022) 450 each 3 Not Taking    Assessment: Pharmacy consulted to dose heparin in patient with pulmonary embolism confirmed by CT angio. She is not on anticoagulation prior to admission.  HL 0.43- therapeutic x 2 CBC WNL  Goal of Therapy:  Heparin level 0.3-0.7 units/ml Monitor platelets by anticoagulation protocol: Yes   Plan:  Continue heparin infusion at 900 units/hr Check anti-Xa level daily Continue to monitor H&H and platelets.   Margot Ables, PharmD Clinical Pharmacist 03/07/2022 8:03 AM

## 2022-03-07 NOTE — Progress Notes (Signed)
Patient has ambulated twice this shift to the bathroom. Patient urine has been strained. No sign of stone. Patient having no pain. Will continue to monitor.

## 2022-03-07 NOTE — Evaluation (Signed)
Physical Therapy Evaluation Patient Details Name: Nicole Santos MRN: [id number] DOB: 30-Aug-1970 Today's Date: 03/07/2022  History of Present Illness  Nicole Santos is a 51 year old female with history of ovarian cancer, abdominal carcinomatosis currently on chemotherapy, last chemo treatment 2 months ago, depression anxiety, type 2 diabetes mellitus, hypertension and recently discharge for recurrent syncopal episodes and dehydration presented to the ED with complaint of syncope.  Patient reports that she passed out while having a bowel movement on the toilet.  She has been feeling weaker than usual.  She has had intermittent shortness of breath.  She has no chest pain symptoms.  She is reporting right flank pain.  She is tired and fatigued.  No edema in the legs.  She was sent for CT with findings of 6 multiple subsegmental right-sided pulmonary emboli and also kidney stone in the right ureter and mild hydroureteronephrosis on the right side.  She was noted to be dehydrated and have electrolyte abnormalities.  She was started on an IV heparin infusion and admission was requested.   Clinical Impression  Patient functioning near baseline for functional mobility and gait. Patient demonstrates good return for ambulation in hallway with RW without loss of balance. Plan: patient discharged from physical therapy to care of nursing for ambulation daily as tolerated for length of stay.       Recommendations for follow up therapy are one component of a multi-disciplinary discharge planning process, led by the attending physician.  Recommendations may be updated based on patient status, additional functional criteria and insurance authorization.  Follow Up Recommendations No PT follow up Can patient physically be transported by private vehicle: Yes    Assistance Recommended at Discharge PRN  Patient can return home with the following  Assistance with cooking/housework;Help with stairs or ramp for entrance     Equipment Recommendations None recommended by PT  Recommendations for Other Services       Functional Status Assessment Patient has not had a recent decline in their functional status     Precautions / Restrictions Precautions Precautions: Fall Restrictions Weight Bearing Restrictions: No      Mobility  Bed Mobility Overal bed mobility: Independent, Modified Independent Bed Mobility: Supine to Sit     Supine to sit: Modified independent (Device/Increase time), Independent     General bed mobility comments: did not use handrails    Transfers Overall transfer level: Modified independent Equipment used: Rolling walker (2 wheels) Transfers: Sit to/from Stand Sit to Stand: Modified independent (Device/Increase time), Supervision           General transfer comment: Supervision for safety, uses RW for stability    Ambulation/Gait Ambulation/Gait assistance: Supervision, Modified independent (Device/Increase time) Gait Distance (Feet): 150 Feet Assistive device: Rolling walker (2 wheels) Gait Pattern/deviations: Decreased step length - left, Decreased step length - right, Decreased stride length Gait velocity: decreased     General Gait Details: slowed cadence with use of RW, no loss of balance, no physical assist required  Stairs            Wheelchair Mobility    Modified Rankin (Stroke Patients Only)       Balance Overall balance assessment: Needs assistance Sitting-balance support: Feet supported, No upper extremity supported Sitting balance-Leahy Scale: Good Sitting balance - Comments: seated at EOB   Standing balance support: During functional activity, Bilateral upper extremity supported Standing balance-Leahy Scale: Good Standing balance comment: good with use of RW  Pertinent Vitals/Pain Pain Assessment Pain Assessment: No/denies pain    Home Living Family/patient expects to be discharged to::  Private residence Living Arrangements: Spouse/significant other;Children Available Help at Discharge: Family;Available PRN/intermittently Type of Home: House Home Access: Stairs to enter Entrance Stairs-Rails: None Entrance Stairs-Number of Steps: 1   Home Layout: One level Home Equipment: Rollator (4 wheels);BSC/3in1;Shower seat      Prior Function Prior Level of Function : Independent/Modified Independent             Mobility Comments: household ambulator without AD ADLs Comments: Independent with household ADLs, family assist with community ADLs     Hand Dominance        Extremity/Trunk Assessment   Upper Extremity Assessment Upper Extremity Assessment: Overall WFL for tasks assessed    Lower Extremity Assessment Lower Extremity Assessment: Overall WFL for tasks assessed    Cervical / Trunk Assessment Cervical / Trunk Assessment: Normal  Communication   Communication: No difficulties  Cognition Arousal/Alertness: Awake/alert Behavior During Therapy: WFL for tasks assessed/performed Overall Cognitive Status: Within Functional Limits for tasks assessed                                          General Comments      Exercises     Assessment/Plan    PT Assessment Patient does not need any further PT services  PT Problem List Decreased strength;Decreased activity tolerance;Decreased balance;Decreased mobility       PT Treatment Interventions      PT Goals (Current goals can be found in the Care Plan section)  Acute Rehab PT Goals Patient Stated Goal: return home PT Goal Formulation: With patient/family Time For Goal Achievement: 03/07/22 Potential to Achieve Goals: Good    Frequency       Co-evaluation               AM-PAC PT "6 Clicks" Mobility  Outcome Measure Help needed turning from your back to your side while in a flat bed without using bedrails?: None Help needed moving from lying on your back to sitting on the  side of a flat bed without using bedrails?: None Help needed moving to and from a bed to a chair (including a wheelchair)?: None Help needed standing up from a chair using your arms (e.g., wheelchair or bedside chair)?: A Little Help needed to walk in hospital room?: A Little Help needed climbing 3-5 steps with a railing? : A Lot 6 Click Score: 20    End of Session   Activity Tolerance: Patient tolerated treatment well;Patient limited by fatigue Patient left: in bed;with call bell/phone within reach;with family/visitor present (seated EOB) Nurse Communication: Mobility status PT Visit Diagnosis: Unsteadiness on feet (R26.81);Other abnormalities of gait and mobility (R26.89);Muscle weakness (generalized) (M62.81)    Time: 8563-1497 PT Time Calculation (min) (ACUTE ONLY): 10 min   Charges:   PT Evaluation $PT Eval Low Complexity: 1 Low PT Treatments $Therapeutic Activity: 8-22 mins        Zigmund Gottron, SPT

## 2022-03-08 ENCOUNTER — Inpatient Hospital Stay: Payer: Commercial Managed Care - PPO

## 2022-03-08 ENCOUNTER — Inpatient Hospital Stay: Payer: Commercial Managed Care - PPO | Attending: Gynecologic Oncology | Admitting: Hematology

## 2022-03-08 ENCOUNTER — Telehealth: Payer: Self-pay

## 2022-03-08 ENCOUNTER — Inpatient Hospital Stay: Payer: Commercial Managed Care - PPO | Admitting: Dietician

## 2022-03-08 DIAGNOSIS — I2699 Other pulmonary embolism without acute cor pulmonale: Secondary | ICD-10-CM | POA: Diagnosis not present

## 2022-03-08 DIAGNOSIS — C482 Malignant neoplasm of peritoneum, unspecified: Secondary | ICD-10-CM | POA: Diagnosis present

## 2022-03-08 DIAGNOSIS — Z7901 Long term (current) use of anticoagulants: Secondary | ICD-10-CM | POA: Insufficient documentation

## 2022-03-08 DIAGNOSIS — R634 Abnormal weight loss: Secondary | ICD-10-CM | POA: Diagnosis not present

## 2022-03-08 DIAGNOSIS — G629 Polyneuropathy, unspecified: Secondary | ICD-10-CM | POA: Diagnosis not present

## 2022-03-08 DIAGNOSIS — E876 Hypokalemia: Secondary | ICD-10-CM | POA: Insufficient documentation

## 2022-03-08 DIAGNOSIS — Z79899 Other long term (current) drug therapy: Secondary | ICD-10-CM | POA: Diagnosis not present

## 2022-03-08 LAB — GLUCOSE, CAPILLARY: Glucose-Capillary: 87 mg/dL (ref 70–99)

## 2022-03-08 LAB — CBC
HCT: 27.2 % — ABNORMAL LOW (ref 36.0–46.0)
Hemoglobin: 9.2 g/dL — ABNORMAL LOW (ref 12.0–15.0)
MCH: 32.9 pg (ref 26.0–34.0)
MCHC: 33.8 g/dL (ref 30.0–36.0)
MCV: 97.1 fL (ref 80.0–100.0)
Platelets: 166 10*3/uL (ref 150–400)
RBC: 2.8 MIL/uL — ABNORMAL LOW (ref 3.87–5.11)
RDW: 21.8 % — ABNORMAL HIGH (ref 11.5–15.5)
WBC: 3.2 10*3/uL — ABNORMAL LOW (ref 4.0–10.5)
nRBC: 0 % (ref 0.0–0.2)

## 2022-03-08 MED ORDER — MEGESTROL ACETATE 400 MG/10ML PO SUSP: 400.0000 mg | Freq: Two times a day (BID) | ORAL | 2 refills | Status: DC

## 2022-03-08 MED ORDER — ENOXAPARIN SODIUM 120 MG/0.8ML IJ SOSY
120.0000 mg | PREFILLED_SYRINGE | Freq: Once | INTRAMUSCULAR | Status: AC
  Administered 2022-03-08: 120 mg via SUBCUTANEOUS
  Filled 2022-03-08: qty 0.8

## 2022-03-08 MED ORDER — APIXABAN 5 MG PO TABS: 10.0000 mg | ORAL_TABLET | Freq: Two times a day (BID) | ORAL | 1 refills | Status: DC

## 2022-03-08 NOTE — Progress Notes (Signed)
No labs needed per Dr. Delton Coombes.  Patient was discharged from the hospital yesterday.

## 2022-03-08 NOTE — Progress Notes (Signed)
Patient in clinic today for follow up visit. Lovenox injection ordered by physician. Patient remained stable throughout injection. See MAR for injection information. Patient discharged from clinic in wheelchair with husband in stable condition.

## 2022-03-08 NOTE — Patient Instructions (Addendum)
[facility] at [facility] Discharge Instructions   You were seen and examined today by Dr. Delton Coombes.  He discussed with you about placement of PEG tube with your for feeding to get your nutritional status to where you will be ready for surgery.   We will give you a blood thinner shot today (Lovenox). Please call [facility] to check the status of your Eliquis prescription. Call us if they do not have the medication in stock and we will send it to another pharmacy.  You will see the dietician today.   We will send a prescription to help stimulate your appetite. It is called Megace. You take 2 teaspoons twice a day.   We will see you back in 2 weeks.   Thank you for choosing [place] at [facility] to provide your oncology and hematology care.  To afford each patient quality time with our provider, please arrive at least 15 minutes before your scheduled appointment time.   If you have a lab appointment with the [facility] please come in thru the Main Entrance and check in at the main information desk.  You need to re-schedule your appointment should you arrive 10 or more minutes late.  We strive to give you quality time with our providers, and arriving late affects you and other patients whose appointments are after yours.  Also, if you no show three or more times for appointments you may be dismissed from the clinic at the providers discretion.     Again, thank you for choosing [facility].  Our hope is that these requests will decrease the amount of time that you wait before being seen by our physicians.       _____________________________________________________________  Should you have questions after your visit to [facility], please contact our office at [phone] and follow the prompts.  Our office hours are 8:00 a.m. and 4:30 p.m. Monday - Friday.  Please note that voicemails left after 4:00 p.m. may not be  returned until the following business day.  We are closed weekends and major holidays.  You do have access to a nurse 24-7, just call the main number to the clinic [phone] and do not press any options, hold on the line and a nurse will answer the phone.    For prescription refill requests, have your pharmacy contact our office and allow 72 hours.    Due to Covid, you will need to wear a mask upon entering the hospital. If you do not have a mask, a mask will be given to you at the Main Entrance upon arrival. For doctor visits, patients may have 1 support person age 48 or older with them. For treatment visits, patients can not have anyone with them due to social distancing guidelines and our immunocompromised population.

## 2022-03-08 NOTE — Progress Notes (Signed)
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  PRIOR THERAPY: none  NGS Results: not done  CURRENT THERAPY: Carboplatin (AUC 6) / Paclitaxel (175) q21d x 6 cycles  BRIEF ONCOLOGIC HISTORY:  Oncology History  Primary peritoneal carcinomatosis ([place])  11/17/2021 Initial Diagnosis   Primary peritoneal carcinomatosis ([place])   11/21/2021 - 01/25/2022 Chemotherapy   Patient is on Treatment Plan : OVARIAN Carboplatin (AUC 6) / Paclitaxel (175) q21d x 6 cycles     11/21/2021 -  Chemotherapy   Patient is on Treatment Plan : OVARIAN Carboplatin (AUC 6) + Paclitaxel (175) q21d X 6 Cycles       CANCER STAGING:  Cancer Staging  Primary peritoneal carcinomatosis ([place]) Staging form: Ovary, Fallopian Tube, and Primary Peritoneal Carcinoma, AJCC 8th Edition - Clinical stage from 11/17/2021: FIGO Stage IIIC (cT3c, cM0) - Unsigned   INTERVAL HISTORY:  [name], a 51 y.o. female, seen for follow-up of ovarian cancer.  She was admitted to the hospital for syncopal episodes and found to have pulmonary embolism.  She was started on heparin which was discontinued yesterday.  Unfortunately she could not fill the Eliquis prescription as the pharmacy does not have it.  She reports energy levels 20%.  She reports loss of taste and is unable to eat.  She also reports decrease in appetite.   REVIEW OF SYSTEMS:  Review of Systems  Constitutional:  Negative for appetite change, fatigue and unexpected weight change.  Gastrointestinal:  Positive for diarrhea.  Genitourinary:  Negative for frequency.   Neurological:  Positive for dizziness and numbness (In the fingertips and feet stable).  Psychiatric/Behavioral:  Negative for sleep disturbance.   All other systems reviewed and are  negative.   PAST MEDICAL/SURGICAL HISTORY:  Past Medical History:  Diagnosis Date   Acute respiratory disease due to COVID-19 virus 02/02/2019   Anxiety    Cancer ([place])    Depression    Dyspnea    Dysrhythmia    History of diabetes mellitus    Resolved in Sept 2023 with weight loss.   Hypertension    Ovarian cancer ([place]) 11/14/2021   Peritoneal carcinomatosis ([place])    Pneumonia    Sleep apnea    doesn't use CPAP   Tachycardia    Past Surgical History:  Procedure Laterality Date   KNEE SURGERY Right    Arthroscopic   LAPAROSCOPIC ABDOMINAL EXPLORATION N/A 11/13/2021   Procedure: Exploratory laparoscopy;  Surgeon: Aviva Signs, MD;  Location: [facility];  Service: General;  Laterality: N/A;   PORTACATH PLACEMENT Left 11/15/2021   Procedure: INSERTION PORT-A-CATH;  Surgeon: Aviva Signs, MD;  Location: [facility];  Service: General;  Laterality: Left;   TONSILLECTOMY AND ADENOIDECTOMY      SOCIAL HISTORY:  Social History   Socioeconomic History   Marital status: Married    Spouse name: Not on file   Number of children: Not on file   Years of education: Not on file   Highest education level: Not on file  Occupational History   Occupation: Unemployed  Tobacco Use   Smoking status: Never   Smokeless tobacco: Never  Vaping Use   Vaping Use: Never used  Substance and Sexual Activity   Alcohol use: No   Drug use: No   Sexual  activity: Not Currently  Other Topics Concern   Not on file  Social History Narrative   Not on file   Social Determinants of Health   Financial Resource Strain: Not on file  Food Insecurity: No Food Insecurity (03/06/2022)   Hunger Vital Sign    Worried About Running Out of Food in the Last Year: Never true    Ran Out of Food in the Last Year: Never true  Transportation Needs: No Transportation Needs (03/06/2022)   PRAPARE - Hydrologist (Medical): No    Lack of Transportation (Non-Medical): No  Physical Activity: Not  on file  Stress: Not on file  Social Connections: Not on file  Intimate Partner Violence: Not At Risk (03/06/2022)   Humiliation, Afraid, Rape, and Kick questionnaire    Fear of Current or Ex-Partner: No    Emotionally Abused: No    Physically Abused: No    Sexually Abused: No    FAMILY HISTORY:  Family History  Problem Relation Age of Onset   Hypertension Mother    Bladder Cancer Maternal Grandmother    Breast cancer Maternal Great-grandmother    Colon cancer Neg Hx    Ovarian cancer Neg Hx    Endometrial cancer Neg Hx    Pancreatic cancer Neg Hx    Prostate cancer Neg Hx     CURRENT MEDICATIONS:  Current Outpatient Medications  Medication Sig Dispense Refill   atorvastatin (LIPITOR) 20 MG tablet Take 1 tablet (20 mg total) by mouth daily. (Patient taking differently: Take 20 mg by mouth at bedtime.) 90 tablet 1   magnesium oxide (MAG-OX) 400 (240 Mg) MG tablet Take 1 tablet (400 mg total) by mouth 2 (two) times daily. (Patient taking differently: Take 400 mg by mouth at bedtime.) 60 tablet 2   megestrol (MEGACE) 400 MG/10ML suspension Take 10 mLs (400 mg total) by mouth 2 (two) times daily. 480 mL 2   Potassium 99 MG TABS Take 99 mg by mouth at bedtime.     apixaban (ELIQUIS) 5 MG TABS tablet Take 2 tablets (10 mg total) by mouth 2 (two) times daily. Then on 03/14/22, take 1 tab (5 mg) two times daily. 74 tablet 1   No current facility-administered medications for this visit.    ALLERGIES:  Allergies  Allergen Reactions   Augmentin [Amoxicillin-Pot Clavulanate] Diarrhea and Nausea And Vomiting   Doxycycline     Abdominal cramps.   Topamax [Topiramate] Nausea And Vomiting    PHYSICAL EXAM:  Performance status (ECOG): 1 - Symptomatic but completely ambulatory  There were no vitals filed for this visit.  Wt Readings from Last 3 Encounters:  03/06/22 176 lb 5.9 oz (80 kg)  03/01/22 177 lb (80.3 kg)  02/20/22 197 lb 8.5 oz (89.6 kg)   Physical Exam Vitals reviewed.   Constitutional:      Appearance: Normal appearance. She is obese.     Comments: In wheelchair  Cardiovascular:     Rate and Rhythm: Normal rate and regular rhythm.     Pulses: Normal pulses.     Heart sounds: Normal heart sounds.  Pulmonary:     Effort: Pulmonary effort is normal.     Breath sounds: Normal breath sounds.  Abdominal:     Palpations: Abdomen is soft. There is no hepatomegaly, splenomegaly or mass.     Tenderness: There is no abdominal tenderness.  Neurological:     General: No focal deficit present.     Mental Status: She  is alert and oriented to person, place, and time.  Psychiatric:        Mood and Affect: Mood normal.        Behavior: Behavior normal.     LABORATORY DATA:  I have reviewed the labs as listed.     Latest Ref Rng & Units 03/07/2022    3:20 AM 03/06/2022    5:46 AM 02/21/2022    5:55 AM  CBC  WBC 4.0 - 10.5 K/uL 3.2  6.5  3.4   Hemoglobin 12.0 - 15.0 g/dL 9.2  11.9  10.8   Hematocrit 36.0 - 46.0 % 27.2  35.0  33.0   Platelets 150 - 400 K/uL 166  183  144       Latest Ref Rng & Units 03/07/2022    3:20 AM 03/06/2022    5:46 AM 03/01/2022   12:42 PM  CMP  Glucose 70 - 99 mg/dL 53  86  62   BUN 6 - 20 mg/dL <5  7  5    Creatinine 0.44 - 1.00 mg/dL 0.47  0.80  0.61   Sodium 135 - 145 mmol/L 137  133  136   Potassium 3.5 - 5.1 mmol/L 3.8  2.8  3.7   Chloride 98 - 111 mmol/L 105  95  93   CO2 22 - 32 mmol/L 20  21  20    Calcium 8.9 - 10.3 mg/dL 7.6  8.4  8.8   Total Protein 6.0 - 8.5 g/dL   6.7   Total Bilirubin 0.0 - 1.2 mg/dL   1.0   Alkaline Phos 44 - 121 IU/L   66   AST 0 - 40 IU/L   21   ALT 0 - 32 IU/L   8     DIAGNOSTIC IMAGING:  I have independently reviewed the scans and discussed with the patient. ECHOCARDIOGRAM LIMITED  Result Date: 03/06/2022    ECHOCARDIOGRAM LIMITED REPORT   Patient Name:   LATIVIA VELIE Date of Exam: 03/06/2022 Medical Rec #:  [id number]   Height:       62.0 in Accession #:    [id number]  Weight:       176.4  lb Date of Birth:  Jul 24, 1970    BSA:          1.812 m Patient Age:    84 years    BP:           116/74 mmHg Patient Gender: F           HR:           69 bpm. Exam Location:  [facility] Procedure: Limited Echo Indications:    Pulmonary Embolus I26.09  History:        Patient has prior history of Echocardiogram examinations, most                 recent 02/17/2022. Signs/Symptoms:Dyspnea and Syncope; Risk                 Factors:Hypertension, Dyslipidemia and Diabetes.  Sonographer:    Alvino Chapel RCS Referring Phys: [place]  1. Left ventricular ejection fraction, by estimation, is 55 to 60%. The left ventricle has normal function. The left ventricle has no regional wall motion abnormalities.  2. Right ventricular systolic function is normal. The right ventricular size is normal.  3. The inferior vena cava is normal in size with greater than 50% respiratory variability, suggesting right atrial pressure of 3 mmHg.  4. Limited echo to evaluate LV and RV function FINDINGS  Left Ventricle: Left ventricular ejection fraction, by estimation, is 55 to 60%. The left ventricle has normal function. The left ventricle has no regional wall motion abnormalities. There is no left ventricular hypertrophy. Right Ventricle: The right ventricular size is normal. Right vetricular wall thickness was not well visualized. Right ventricular systolic function is normal. Venous: The inferior vena cava is normal in size with greater than 50% respiratory variability, suggesting right atrial pressure of 3 mmHg. LEFT VENTRICLE PLAX 2D LVIDd:         4.60 cm LVIDs:         2.90 cm LV PW:         0.70 cm LV IVS:        0.80 cm LVOT diam:     1.90 cm LVOT Area:     2.84 cm  LEFT ATRIUM         Index LA diam:    3.00 cm 1.66 cm/m   AORTA Ao Root diam: 2.90 cm  SHUNTS Systemic Diam: 1.90 cm Carlyle Dolly MD Electronically signed by Carlyle Dolly MD Signature Date/Time: 03/06/2022/4:10:58 PM    Final    US Venous Img  Lower Bilateral (DVT)  Result Date: 03/06/2022 CLINICAL DATA:  Shortness of breath with syncope. EXAM: BILATERAL LOWER EXTREMITY VENOUS DOPPLER ULTRASOUND TECHNIQUE: Gray-scale sonography with graded compression, as well as color Doppler and duplex ultrasound were performed to evaluate the lower extremity deep venous systems from the level of the common femoral vein and including the common femoral, femoral, profunda femoral, popliteal and calf veins including the posterior tibial, peroneal and gastrocnemius veins when visible. The superficial great saphenous vein was also interrogated. Spectral Doppler was utilized to evaluate flow at rest and with distal augmentation maneuvers in the common femoral, femoral and popliteal veins. COMPARISON:  CT abdomen and pelvis 03/06/2022 FINDINGS: RIGHT LOWER EXTREMITY Common Femoral Vein: No evidence of thrombus. Normal compressibility, respiratory phasicity and response to augmentation. Saphenofemoral Junction: No evidence of thrombus. Normal compressibility and flow on color Doppler imaging. Profunda Femoral Vein: No evidence of thrombus. Normal compressibility and flow on color Doppler imaging. Femoral Vein: No evidence of thrombus. Normal compressibility, respiratory phasicity and response to augmentation. Popliteal Vein: No evidence of thrombus. Normal compressibility, respiratory phasicity and response to augmentation. Calf Veins: No evidence of thrombus. Normal compressibility and flow on color Doppler imaging. LEFT LOWER EXTREMITY Common Femoral Vein: No evidence of thrombus. Normal compressibility, respiratory phasicity and response to augmentation. Saphenofemoral Junction: No evidence of thrombus. Normal compressibility and flow on color Doppler imaging. Profunda Femoral Vein: No evidence of thrombus. Normal compressibility and flow on color Doppler imaging. Femoral Vein: No evidence of thrombus. Normal compressibility, respiratory phasicity and response to  augmentation. Popliteal Vein: No evidence of thrombus. Normal compressibility, respiratory phasicity and response to augmentation. Calf Veins: No evidence of thrombus. Normal compressibility and flow on color Doppler imaging. Other Findings:  None. IMPRESSION: No evidence of deep venous thrombosis in either lower extremity. Electronically Signed   By: Markus Daft M.D.   On: 03/06/2022 14:56   CT Angio Chest Pulmonary Embolism (PE) W or WO Contrast  Result Date: 03/06/2022 CLINICAL DATA:  Syncope.  Weakness. EXAM: CT ANGIOGRAPHY CHEST CT ABDOMEN AND PELVIS WITH CONTRAST TECHNIQUE: Multidetector CT imaging of the chest was performed using the standard protocol during bolus administration of intravenous contrast. Multiplanar CT image reconstructions and MIPs were obtained to evaluate the vascular anatomy. Multidetector CT  imaging of the abdomen and pelvis was performed using the standard protocol during bolus administration of intravenous contrast. RADIATION DOSE REDUCTION: This exam was performed according to the departmental dose-optimization program which includes automated exposure control, adjustment of the mA and/or kV according to patient size and/or use of iterative reconstruction technique. CONTRAST:  158m OMNIPAQUE IOHEXOL 350 MG/ML SOLN COMPARISON:  11/13/2021 FINDINGS: CTA CHEST FINDINGS Cardiovascular: Satisfactory opacification of the pulmonary arteries to the segmental level. Subsegmental branching and linear pulmonary artery filling defects seen within 3 vessels of the right lung, marked on series 5. Normal heart size. No pericardial effusion. Mediastinum/Nodes: Cystic density anterior to the trachea measuring 2 cm, attributed to uncomplicated foregut cyst. Lungs/Pleura: Mosaic attenuation of the lungs, likely from small airways disease and air trapping. Musculoskeletal: No acute finding Review of the MIP images confirms the above findings. CT ABDOMEN and PELVIS FINDINGS Hepatobiliary: No focal  liver abnormality.Cholelithiasis. No evidence of biliary inflammation or obstruction. Pancreas: Unremarkable. Spleen: Unremarkable. Adrenals/Urinary Tract: Negative adrenals. Mild right hydronephrosis and hydroureter due to a 4 mm UVJ calculus. Distal urothelial thickening at the right ureter is likely reactive. 2.2 cm left renal cyst. Negative bladder. Stomach/Bowel:  No obstruction. No visible bowel inflammation Vascular/Lymphatic: Notable atheromatous calcification for age affecting the aorta and iliacs. Patent appearance of the left iliac vein stent. No mass or adenopathy. Reproductive:Stable. Other: Unchanged generalized omental reticulation and peritoneal thickening. There is chart history of peritoneal carcinomatosis. Musculoskeletal: Subjective osteopenia. Coarsened appearance of right ilium trabecule and cortex question prior radiotherapy. No radiation administered, Paget's is favored. Review of the MIP images confirms the above findings. Critical Value/emergent results were called by telephone at the time of interpretation on 03/06/2022 at 7:14 am [facility] these results. IMPRESSION: Chest CT: Scattered subsegmental pulmonary emboli in the right lung. Abdominal CT: 1. Mild right hydroureteronephrosis from a 4 mm UVJ calculus. 2. Unchanged omental disease. 3. Cholelithiasis and other chronic findings described above. Electronically Signed   By: JJorje GuildM.D.   On: 03/06/2022 07:17   CT ABDOMEN PELVIS W CONTRAST  Result Date: 03/06/2022 CLINICAL DATA:  Syncope.  Weakness. EXAM: CT ANGIOGRAPHY CHEST CT ABDOMEN AND PELVIS WITH CONTRAST TECHNIQUE: Multidetector CT imaging of the chest was performed using the standard protocol during bolus administration of intravenous contrast. Multiplanar CT image reconstructions and MIPs were obtained to evaluate the vascular anatomy. Multidetector CT imaging of the abdomen and pelvis was performed using the standard  protocol during bolus administration of intravenous contrast. RADIATION DOSE REDUCTION: This exam was performed according to the departmental dose-optimization program which includes automated exposure control, adjustment of the mA and/or kV according to patient size and/or use of iterative reconstruction technique. CONTRAST:  1060mOMNIPAQUE IOHEXOL 350 MG/ML SOLN COMPARISON:  11/13/2021 FINDINGS: CTA CHEST FINDINGS Cardiovascular: Satisfactory opacification of the pulmonary arteries to the segmental level. Subsegmental branching and linear pulmonary artery filling defects seen within 3 vessels of the right lung, marked on series 5. Normal heart size. No pericardial effusion. Mediastinum/Nodes: Cystic density anterior to the trachea measuring 2 cm, attributed to uncomplicated foregut cyst. Lungs/Pleura: Mosaic attenuation of the lungs, likely from small airways disease and air trapping. Musculoskeletal: No acute finding Review of the MIP images confirms the above findings. CT ABDOMEN and PELVIS FINDINGS Hepatobiliary: No focal liver abnormality.Cholelithiasis. No evidence of biliary inflammation or obstruction. Pancreas: Unremarkable. Spleen: Unremarkable. Adrenals/Urinary Tract: Negative adrenals. Mild right hydronephrosis and hydroureter due to a 4 mm UVJ calculus. Distal urothelial  thickening at the right ureter is likely reactive. 2.2 cm left renal cyst. Negative bladder. Stomach/Bowel:  No obstruction. No visible bowel inflammation Vascular/Lymphatic: Notable atheromatous calcification for age affecting the aorta and iliacs. Patent appearance of the left iliac vein stent. No mass or adenopathy. Reproductive:Stable. Other: Unchanged generalized omental reticulation and peritoneal thickening. There is chart history of peritoneal carcinomatosis. Musculoskeletal: Subjective osteopenia. Coarsened appearance of right ilium trabecule and cortex question prior radiotherapy. No radiation administered, Paget's is  favored. Review of the MIP images confirms the above findings. Critical Value/emergent results were called by telephone at the time of interpretation on 03/06/2022 at 7:14 am to provider [facility] , who verbally acknowledged these results. IMPRESSION: Chest CT: Scattered subsegmental pulmonary emboli in the right lung. Abdominal CT: 1. Mild right hydroureteronephrosis from a 4 mm UVJ calculus. 2. Unchanged omental disease. 3. Cholelithiasis and other chronic findings described above. Electronically Signed   By: Jorje Guild M.D.   On: 03/06/2022 07:17   ECHOCARDIOGRAM COMPLETE  Result Date: 02/17/2022    ECHOCARDIOGRAM REPORT   Patient Name:   Verlinda F Ding Date of Exam: 02/17/2022 Medical Rec #:  [id number]   Height:       62.0 in Accession #:    [id number]  Weight:       189.4 lb Date of Birth:  1971/01/09    BSA:          1.868 m Patient Age:    55 years    BP:           88/58 mmHg Patient Gender: F           HR:           83 bpm. Exam Location:  [facility] Procedure: 2D Echo, Cardiac Doppler and Color Doppler Indications:    Syncope  History:        Patient has prior history of Echocardiogram examinations, most                 recent 09/17/2016. Signs/Symptoms:Dyspnea and Syncope; Risk                 Factors:Hypertension and Dyslipidemia.  Sonographer:    Wenda Low Referring Phys: [id number] ASIA B [place]  1. Left ventricular ejection fraction, by estimation, is 60 to 65%. The left ventricle has normal function. The left ventricle has no regional wall motion abnormalities. Left ventricular diastolic parameters were normal.  2. Right ventricular systolic function is normal. The right ventricular size is mildly enlarged. There is mildly elevated pulmonary artery systolic pressure. The estimated right ventricular systolic pressure is 51.7 mmHg.  3. The mitral valve is grossly normal. Trivial mitral valve regurgitation.  4. The aortic valve is tricuspid. Aortic valve regurgitation is  trivial. Aortic valve sclerosis is present, with no evidence of aortic valve stenosis. Aortic valve mean gradient measures 5.0 mmHg.  5. The inferior vena cava is normal in size with <50% respiratory variability, suggesting right atrial pressure of 8 mmHg. Comparison(s): Prior images unable to be directly viewed, comparison made by report only. FINDINGS  Left Ventricle: Left ventricular ejection fraction, by estimation, is 60 to 65%. The left ventricle has normal function. The left ventricle has no regional wall motion abnormalities. The left ventricular internal cavity size was normal in size. There is  no left ventricular hypertrophy. Left ventricular diastolic parameters were normal. Right Ventricle: The right ventricular size is mildly enlarged. No increase in right ventricular wall thickness. Right ventricular systolic function  is normal. There is mildly elevated pulmonary artery systolic pressure. The tricuspid regurgitant velocity is 2.73 m/s, and with an assumed right atrial pressure of 8 mmHg, the estimated right ventricular systolic pressure is 74.2 mmHg. Left Atrium: Left atrial size was normal in size. Right Atrium: Right atrial size was normal in size. Pericardium: Trivial pericardial effusion is present. The pericardial effusion is posterior to the left ventricle. Presence of epicardial fat layer. Mitral Valve: The mitral valve is grossly normal. Trivial mitral valve regurgitation. MV peak gradient, 7.1 mmHg. The mean mitral valve gradient is 3.0 mmHg. Tricuspid Valve: The tricuspid valve is grossly normal. Tricuspid valve regurgitation is mild. Aortic Valve: The aortic valve is tricuspid. There is mild aortic valve annular calcification. Aortic valve regurgitation is trivial. Aortic valve sclerosis is present, with no evidence of aortic valve stenosis. Aortic valve mean gradient measures 5.0 mmHg. Aortic valve peak gradient measures 12.8 mmHg. Aortic valve area, by VTI measures 2.16 cm. Pulmonic  Valve: The pulmonic valve was grossly normal. Pulmonic valve regurgitation is trivial. Aorta: The aortic root is normal in size and structure. Venous: The inferior vena cava is normal in size with less than 50% respiratory variability, suggesting right atrial pressure of 8 mmHg. IAS/Shunts: No atrial level shunt detected by color flow Doppler.  LEFT VENTRICLE PLAX 2D LVIDd:         4.60 cm   Diastology LVIDs:         2.70 cm   LV e' medial:    9.46 cm/s LV PW:         0.80 cm   LV E/e' medial:  12.4 LV IVS:        0.90 cm   LV e' lateral:   9.57 cm/s LVOT diam:     2.00 cm   LV E/e' lateral: 12.2 LV SV:         72 LV SV Index:   39 LVOT Area:     3.14 cm  RIGHT VENTRICLE RV Basal diam:  2.90 cm RV Mid diam:    4.00 cm RV S prime:     10.30 cm/s TAPSE (M-mode): 2.3 cm LEFT ATRIUM             Index        RIGHT ATRIUM           Index LA diam:        3.20 cm 1.71 cm/m   RA Area:     12.70 cm LA Vol (A2C):   38.5 ml 20.61 ml/m  RA Volume:   30.40 ml  16.28 ml/m LA Vol (A4C):   34.0 ml 18.20 ml/m LA Biplane Vol: 38.7 ml 20.72 ml/m  AORTIC VALVE                     PULMONIC VALVE AV Area (Vmax):    2.05 cm      PV Vmax:       1.02 m/s AV Area (Vmean):   2.24 cm      PV Peak grad:  4.2 mmHg AV Area (VTI):     2.16 cm AV Vmax:           179.00 cm/s AV Vmean:          104.000 cm/s AV VTI:            0.333 m AV Peak Grad:      12.8 mmHg AV Mean Grad:      5.0 mmHg LVOT  Vmax:         117.00 cm/s LVOT Vmean:        74.000 cm/s LVOT VTI:          0.229 m LVOT/AV VTI ratio: 0.69  AORTA Ao Root diam: 2.90 cm MITRAL VALVE                TRICUSPID VALVE MV Area (PHT): 4.57 cm     TR Peak grad:   29.8 mmHg MV Area VTI:   2.23 cm     TR Vmax:        273.00 cm/s MV Peak grad:  7.1 mmHg MV Mean grad:  3.0 mmHg     SHUNTS MV Vmax:       1.33 m/s     Systemic VTI:  0.23 m MV Vmean:      74.9 cm/s    Systemic Diam: 2.00 cm MV Decel Time: 166 msec MV E velocity: 117.00 cm/s MV A velocity: 79.70 cm/s MV E/A ratio:  1.47 Rozann Lesches MD Electronically signed by Rozann Lesches MD Signature Date/Time: 02/17/2022/1:38:30 PM    Final    DG Chest Port 1 View  Result Date: 02/16/2022 CLINICAL DATA:  Possible sepsis, unwitnessed fall. EXAM: PORTABLE CHEST 1 VIEW COMPARISON:  11/15/2021. FINDINGS: The heart size and mediastinal contours are within normal limits. No consolidation, effusion, or pneumothorax. Old healed rib fractures are noted bilaterally. A left chest port terminates over the junction of the brachiocephalic vein and superior vena cava, slightly retracted as compared with the prior exam. IMPRESSION: No active disease. Electronically Signed   By: Brett Fairy M.D.   On: 02/16/2022 23:40     ASSESSMENT:  High-grade serous carcinoma of peritoneum: - She had developed sharp left-sided abdominal pain since Mother's Day, occasional radiating to the back.  40 pound weight loss in the last 6 months. - Personal history of cervical cancer in 2000, status post chemo plus XRT at [facility].  She has slightly decreased hearing in the left ear since then. - US abdomen (11/08/2021): Cholelithiasis with gallbladder wall thickening and sonographic Murphy sign.  Upper abdominal ascites. - Laparoscopy (11/13/2021): Starting of the omentum as well as the upper abdominal wall and diaphragm.  Liver was within normal limits.  6 L of ascites was evacuated.  Omental and peritoneal biopsies were sent.  Peritoneal implants throughout and a significant portion of omentum and on the small intestine. - CT CAP (11/13/2021): Ascites, extensive peritoneal thickening, nodularity and omental caking.  No evidence of primary malignancy in the chest, abdomen or pelvis.  Small left pleural effusion, nonspecific.  Cholelithiasis. - US pelvis (11/14/2021): Uterus is grossly unremarkable.  Ovaries not visualized. - CA125 (11/13/2021): 1269 - Pathology: Peritoneum and omental biopsy consistent with high-grade serous carcinoma.  HER2 2+ by IHC and negative  by FISH. - 3 cycles of carboplatin and paclitaxel from 11/21/2021 through 01/02/2022.    Social/family history: - Lives at home with her husband.  Worked in Scientist, research (medical) at [employer].  Non-smoker. - Maternal grandmother with bladder cancer.  Maternal great grandmother with breast cancer.   PLAN:  High-grade serous carcinoma of peritoneum: - She has completed 4 cycles of chemotherapy and was supposed to have surgery done on 02/20/2022.  However she had hospital admissions and was deemed not a surgical candidate immediately as she has poor nutritional status.  We talked about PEG tube placement.  She is reluctant to consider it at this time.  Her new diagnosis of pulmonary  embolism will also complicate the timing of PEG tube placement and surgery.  We will discuss this with Dr. Berline Lopes.  RTC 2 weeks for follow-up.   2.  Peripheral neuropathy: -She has a neuropathy which is stable in the feet.  3.  Weight loss: - She lost about 20 pounds in the last 3 weeks.  She is not eating much because of loss of taste and appetite. - We will start her on Megace 400 twice daily. - Talk to her about PEG tube placement.  She is reluctant to consider it. - Recommend nutrition consult.  4.  Hypomagnesemia: - Continue magnesium twice daily.  5.  Hypokalemia: - Continue potassium 20 mEq daily.  6.  Subsegmental pulmonary embolism: - Diagnosed on 03/06/2022.  She could not fill the Eliquis. - Today she will receive Lovenox 1.5 mg/kg x 1 dose in the office.  We have sent prescription to another pharmacy.   Orders placed this encounter:  Orders Placed This Encounter  Procedures   CBC with Differential   Comprehensive metabolic panel   Magnesium   CA Lamar, MD [place] [phone]

## 2022-03-08 NOTE — Progress Notes (Signed)
Nutrition Assessment   Reason for Assessment: taste changes, wt loss    ASSESSMENT: 51 year old female with primary peritoneal carcinomatosis, ovarian cancer. Patient completed 4 cycles neoadjuvant chemotherapy with carboplatin + taxol q21d. Debulking surgery under the care of Dr. Berline Lopes planned for 9/19 cancelled due to hospitalization.   Past medical history includes anxiety, depression, dyspnea, DM, HTN, sleep apnea, COVID-19 (2020).  10/3-10/4 hospital admission for acute PE and kidney stone  Met with patient and husband in clinic. She is in a wheelchair and covered in blanket. Patient reports fatigue and weakness. Patient says she has an appetite but severe taste changes are preventing her from eating. She states sweet foods are sickeningly sweet, reminds her of the liquid consumed for glucose tolerance test during pregnancy. All other foods are beginning to taste sweet or are horrible tasting the moment it goes in her mouth. Patient reports smell of food makes her throat close up. She has tried every intervention for altered taste and states she is frustrated. Patient does not feel anyone listens to her. Patient reluctant in receiving the majority of RD information today. She is asking when her taste will return given last chemotherapy was 6 weeks ago. Patient reports appetite stimulant would make things worse. Says the food won't taste any better and she will be starving. All she wants to to be able to be included at meal times with her family. Patient does not want feeding tube at this time. She will think about this over the next few days.   Nutrition Focused Physical Exam:   Orbital Region: moderate Buccal Region: moderate Upper Arm Region: Therapist, art and Lumbar Region: uta Temple Region: mild Clavicle Bone Region: mild Shoulder and Acromion Bone Region: moderate Scapular Bone Region: uta Dorsal Hand: uta Patellar Region: uta Anterior Thigh Region: uta Posterior Calf Region:  uta Edema (RD assessment): uta Hair: reviewed (significant thinning d/t treatment) Eyes: reviewed  Mouth: reviewed  Skin: reviewed  Nails: reviewed    Medications: eliquis, lipitor, mag-ox, megace, potassium    Labs: 10/4 - albumin 2.3, glucose 53, BUN <5   Anthropometrics: Pt is unsure of baseline weight and endorses weight loss while on Ozempic. States she has been off of this medication for months.   Height: 5'2" Weight: 176 lb 5.9 oz  UBW: 195 lb 1.7 lb (9/18 - suspect some wt loss r/t fluid) BMI: 32.26   NUTRITION DIAGNOSIS: Inadequate oral intake related to side effects of therapy as evidenced by altered taste, 9.7% weight loss in 16 days which is severe for time frame.    INTERVENTION:  Pt has tried tart/sour flavors - suggested pt try bland foods (boiled chicken, baked potato with sour cream, plain yogurt) - handout with tips for taste changes provided Suggested baking soda salt water rinses several times daily especially before meals - recipe provided  Samples of unjury protein powder, Pedialyte hydration packs, CIB provided for pt to try (once she can tolerate milk, pt agreeable to trying CIB powder) Recommend placement of feeding tube for nutrition support if pt unable to increase po intake - discussed this with pt and husband. She will think about this over the weekend.  Recommend daily [facility] Contact information given   MONITORING, EVALUATION, GOAL: Patient will tolerate increased calories and protein to minimize further weight loss   Next Visit: Will f/u Monday October 9 via telephone

## 2022-03-08 NOTE — Telephone Encounter (Signed)
Transition Care Management Follow-up Telephone Call Date of discharge and from where: 03/07/2022 [facility]    How have you been since you were released from the hospital? patient states that she is doing well  Any questions or concerns? No   Items Reviewed: Did the pt receive and understand the discharge instructions provided? Yes  Medications obtained and verified? Yes  Other? Patient has no other questions at this time  Any new allergies since your discharge? No  Dietary orders reviewed? No Do you have support at home? Yes    Home Care and Equipment/Supplies: Were home health services ordered? Patient declined because she has people at home everyday that can provide for her needs.  If so, what is the name of the agency? N/A  Has the agency set up a time to come to the patient's home? not applicable Were any new equipment or medical supplies ordered?  No. Patient states that she has wheelchair, portable potty chair and walker at home  What is the name of the medical supply agency? N/A Were you able to get the supplies/equipment? not applicable Do you have any questions related to the use of the equipment or supplies? No   Functional Questionnaire: (I = Independent and D = Dependent) ADLs: I - patient has help from husband and daughter as needed.    Bathing/Dressing- I patient has shower chair   Meal Prep- D   Eating- I   Maintaining continence- I   Transferring/Ambulation- I - patient is able to transfer and ambulation is independent with walker as needed   Managing Meds- D   Follow up appointments reviewed:   PCP Hospital f/u appt confirmed? Patient declines appt - has a follow up with [facility] on 04/02/2022 and states that this works better for her and she does not feel like she needs to come in sooner.  [facility] f/u appt confirmed?  Patient had follow up with oncology today 03/08/22 Are transportation arrangements needed? No  If their condition worsens, is  the pt aware to call PCP or go to the Emergency Dept.? Yes Was the patient provided with contact information for the PCP's office or ED? Yes Was to pt encouraged to call back with questions or concerns? Yes

## 2022-03-12 ENCOUNTER — Inpatient Hospital Stay: Payer: Commercial Managed Care - PPO | Admitting: Dietician

## 2022-03-12 ENCOUNTER — Telehealth: Payer: Self-pay | Admitting: Dietician

## 2022-03-12 NOTE — Telephone Encounter (Addendum)
Attempted to contact pt via telephone for nutrition follow-up. Patient seen 9/5 due to poor po secondary to significant taste changes. Unable to reach patient on cell or home telephone. Left message with request for return call on home phone. Contact information provided.    Received return call from patient. She reports foods are starting to taste better. Patient is drinking a glass of orange juice every morning. She recalls sausage gravy for breakfast. Patient ate a few bites of eggs and ham over the weekend. Says ham slightly irritated her throat, but was able to keep this down. She has a bowl of broccoli cheddar soup in the evenings. Patient is pleased with progress and hopeful of continued improvement.   Encouraged small bites/sips q2-3 hours Encouraged soft moist high protein foods for ease of intake  Next appointment -  Monday, October 16 after MD

## 2022-03-13 ENCOUNTER — Encounter: Payer: Commercial Managed Care - PPO | Admitting: Gynecologic Oncology

## 2022-03-19 ENCOUNTER — Inpatient Hospital Stay: Payer: Commercial Managed Care - PPO

## 2022-03-19 ENCOUNTER — Encounter: Payer: Self-pay | Admitting: General Surgery

## 2022-03-19 ENCOUNTER — Inpatient Hospital Stay: Payer: Commercial Managed Care - PPO | Admitting: Hematology

## 2022-03-19 ENCOUNTER — Inpatient Hospital Stay: Payer: Commercial Managed Care - PPO | Admitting: Dietician

## 2022-03-19 VITALS — BP 116/86 | HR 120 | Temp 96.9°F | Resp 16

## 2022-03-19 DIAGNOSIS — C482 Malignant neoplasm of peritoneum, unspecified: Secondary | ICD-10-CM

## 2022-03-19 LAB — CBC WITH DIFFERENTIAL/PLATELET
Abs Immature Granulocytes: 0.02 10*3/uL (ref 0.00–0.07)
Basophils Absolute: 0 10*3/uL (ref 0.0–0.1)
Basophils Relative: 1 %
Eosinophils Absolute: 0.1 10*3/uL (ref 0.0–0.5)
Eosinophils Relative: 2 %
HCT: 31.7 % — ABNORMAL LOW (ref 36.0–46.0)
Hemoglobin: 10.4 g/dL — ABNORMAL LOW (ref 12.0–15.0)
Immature Granulocytes: 1 %
Lymphocytes Relative: 28 %
Lymphs Abs: 1.2 10*3/uL (ref 0.7–4.0)
MCH: 34 pg (ref 26.0–34.0)
MCHC: 32.8 g/dL (ref 30.0–36.0)
MCV: 103.6 fL — ABNORMAL HIGH (ref 80.0–100.0)
Monocytes Absolute: 0.3 10*3/uL (ref 0.1–1.0)
Monocytes Relative: 7 %
Neutro Abs: 2.7 10*3/uL (ref 1.7–7.7)
Neutrophils Relative %: 61 %
Platelets: 196 10*3/uL (ref 150–400)
RBC: 3.06 MIL/uL — ABNORMAL LOW (ref 3.87–5.11)
RDW: 22.4 % — ABNORMAL HIGH (ref 11.5–15.5)
WBC: 4.3 10*3/uL (ref 4.0–10.5)
nRBC: 0 % (ref 0.0–0.2)

## 2022-03-19 LAB — COMPREHENSIVE METABOLIC PANEL
ALT: 14 U/L (ref 0–44)
AST: 24 U/L (ref 15–41)
Albumin: 2.5 g/dL — ABNORMAL LOW (ref 3.5–5.0)
Alkaline Phosphatase: 61 U/L (ref 38–126)
Anion gap: 7 (ref 5–15)
BUN: 10 mg/dL (ref 6–20)
CO2: 27 mmol/L (ref 22–32)
Calcium: 8.2 mg/dL — ABNORMAL LOW (ref 8.9–10.3)
Chloride: 106 mmol/L (ref 98–111)
Creatinine, Ser: 0.61 mg/dL (ref 0.44–1.00)
GFR, Estimated: >60 mL/min (ref >60)
Glucose, Bld: 122 mg/dL — ABNORMAL HIGH (ref 70–99)
Potassium: 3.2 mmol/L — ABNORMAL LOW (ref 3.5–5.1)
Sodium: 140 mmol/L (ref 135–145)
Total Bilirubin: 0.7 mg/dL (ref 0.3–1.2)
Total Protein: 6.2 g/dL — ABNORMAL LOW (ref 6.5–8.1)

## 2022-03-19 LAB — MAGNESIUM: Magnesium: 1.7 mg/dL (ref 1.7–2.4)

## 2022-03-19 MED ORDER — SODIUM CHLORIDE 0.9% FLUSH: 10.0000 mL | Freq: Once | INTRAVENOUS | Status: DC

## 2022-03-19 MED ORDER — HEPARIN SOD (PORK) LOCK FLUSH 100 UNIT/ML IV SOLN: 500.0000 [IU] | Freq: Once | INTRAVENOUS | Status: DC

## 2022-03-19 NOTE — Progress Notes (Signed)
[place],  [id number]   CLINIC:  Medical Oncology/Hematology  PCP:  Loman Brooklyn, [place] / [place] [address] [phone]   REASON FOR VISIT:  Follow-up for high-grade serous carcinoma of peritoneum  PRIOR THERAPY: none  NGS Results: not done  CURRENT THERAPY: Carboplatin (AUC 6) / Paclitaxel (175) q21d x 6 cycles  BRIEF ONCOLOGIC HISTORY:  Oncology History  Primary peritoneal carcinomatosis ([place])  11/17/2021 Initial Diagnosis   Primary peritoneal carcinomatosis ([place])   11/21/2021 - 01/25/2022 Chemotherapy   Patient is on Treatment Plan : OVARIAN Carboplatin (AUC 6) / Paclitaxel (175) q21d x 6 cycles     11/21/2021 -  Chemotherapy   Patient is on Treatment Plan : OVARIAN Carboplatin (AUC 6) + Paclitaxel (175) q21d X 6 Cycles       CANCER STAGING:  Cancer Staging  Primary peritoneal carcinomatosis ([place]) Staging form: Ovary, Fallopian Tube, and Primary Peritoneal Carcinoma, AJCC 8th Edition - Clinical stage from 11/17/2021: FIGO Stage IIIC (cT3c, cM0) - Unsigned   INTERVAL HISTORY:  [name], a 51 y.o. female, seen for follow-up of ovarian cancer.  She reports that her appetite and her taste buds have improved in the last 1 week and she is eating very well.  She is not taking Megace.  She has gained some weight since then.  Denies any nausea vomiting or diarrhea.   REVIEW OF SYSTEMS:  Review of Systems  Constitutional:  Negative for appetite change, fatigue and unexpected weight change.  Respiratory:  Positive for shortness of breath.   Genitourinary:  Negative for frequency.   Neurological:  Positive for numbness (In the fingertips and feet stable).  Psychiatric/Behavioral:  Negative for sleep disturbance.   All other systems reviewed and are negative.   PAST MEDICAL/SURGICAL HISTORY:  Past Medical History:  Diagnosis Date   Acute respiratory disease due to COVID-19 virus 02/02/2019    Anxiety    Cancer ([place])    Depression    Dyspnea    Dysrhythmia    History of diabetes mellitus    Resolved in Sept 2023 with weight loss.   Hypertension    Ovarian cancer ([place]) 11/14/2021   Peritoneal carcinomatosis ([place])    Pneumonia    Sleep apnea    doesn't use CPAP   Tachycardia    Past Surgical History:  Procedure Laterality Date   KNEE SURGERY Right    Arthroscopic   LAPAROSCOPIC ABDOMINAL EXPLORATION N/A 11/13/2021   Procedure: Exploratory laparoscopy;  Surgeon: Aviva Signs, MD;  Location: [facility];  Service: General;  Laterality: N/A;   PORTACATH PLACEMENT Left 11/15/2021   Procedure: INSERTION PORT-A-CATH;  Surgeon: Aviva Signs, MD;  Location: [facility];  Service: General;  Laterality: Left;   TONSILLECTOMY AND ADENOIDECTOMY      SOCIAL HISTORY:  Social History   Socioeconomic History   Marital status: Married    Spouse name: Not on file   Number of children: Not on file   Years of education: Not on file   Highest education level: Not on file  Occupational History   Occupation: Unemployed  Tobacco Use   Smoking status: Never   Smokeless tobacco: Never  Vaping Use   Vaping Use: Never used  Substance and Sexual Activity   Alcohol use: No   Drug use: No   Sexual activity: Not Currently  Other Topics Concern   Not on file  Social History Narrative   Not on file  Social Determinants of Health   Financial Resource Strain: Not on file  Food Insecurity: No Food Insecurity (03/06/2022)   Hunger Vital Sign    Worried About Running Out of Food in the Last Year: Never true    Ran Out of Food in the Last Year: Never true  Transportation Needs: No Transportation Needs (03/06/2022)   PRAPARE - Hydrologist (Medical): No    Lack of Transportation (Non-Medical): No  Physical Activity: Not on file  Stress: Not on file  Social Connections: Not on file  Intimate Partner Violence: Not At Risk (03/06/2022)   Humiliation, Afraid, Rape, and  Kick questionnaire    Fear of Current or Ex-Partner: No    Emotionally Abused: No    Physically Abused: No    Sexually Abused: No    FAMILY HISTORY:  Family History  Problem Relation Age of Onset   Hypertension Mother    Bladder Cancer Maternal Grandmother    Breast cancer Maternal Great-grandmother    Colon cancer Neg Hx    Ovarian cancer Neg Hx    Endometrial cancer Neg Hx    Pancreatic cancer Neg Hx    Prostate cancer Neg Hx     CURRENT MEDICATIONS:  Current Outpatient Medications  Medication Sig Dispense Refill   apixaban (ELIQUIS) 5 MG TABS tablet Take 2 tablets (10 mg total) by mouth 2 (two) times daily. Then on 03/14/22, take 1 tab (5 mg) two times daily. 74 tablet 1   atorvastatin (LIPITOR) 20 MG tablet Take 1 tablet (20 mg total) by mouth daily. (Patient taking differently: Take 20 mg by mouth at bedtime.) 90 tablet 1   magnesium oxide (MAG-OX) 400 (240 Mg) MG tablet Take 1 tablet (400 mg total) by mouth 2 (two) times daily. (Patient taking differently: Take 400 mg by mouth at bedtime.) 60 tablet 2   Potassium 99 MG TABS Take 99 mg by mouth at bedtime.     No current facility-administered medications for this visit.    ALLERGIES:  Allergies  Allergen Reactions   Augmentin [Amoxicillin-Pot Clavulanate] Diarrhea and Nausea And Vomiting   Doxycycline     Abdominal cramps.   Topamax [Topiramate] Nausea And Vomiting    PHYSICAL EXAM:  Performance status (ECOG): 1 - Symptomatic but completely ambulatory  There were no vitals filed for this visit.  Wt Readings from Last 3 Encounters:  03/06/22 176 lb 5.9 oz (80 kg)  03/01/22 177 lb (80.3 kg)  02/20/22 197 lb 8.5 oz (89.6 kg)   Physical Exam Vitals reviewed.  Constitutional:      Appearance: Normal appearance. She is obese.     Comments: In wheelchair  Cardiovascular:     Rate and Rhythm: Normal rate and regular rhythm.     Pulses: Normal pulses.     Heart sounds: Normal heart sounds.  Pulmonary:      Effort: Pulmonary effort is normal.     Breath sounds: Normal breath sounds.  Abdominal:     Palpations: Abdomen is soft. There is no hepatomegaly, splenomegaly or mass.     Tenderness: There is no abdominal tenderness.  Neurological:     General: No focal deficit present.     Mental Status: She is alert and oriented to person, place, and time.  Psychiatric:        Mood and Affect: Mood normal.        Behavior: Behavior normal.     LABORATORY DATA:  I have reviewed the labs  as listed.     Latest Ref Rng & Units 03/19/2022   10:31 AM 03/07/2022    3:20 AM 03/06/2022    5:46 AM  CBC  WBC 4.0 - 10.5 K/uL 4.3  3.2  6.5   Hemoglobin 12.0 - 15.0 g/dL 10.4  9.2  11.9   Hematocrit 36.0 - 46.0 % 31.7  27.2  35.0   Platelets 150 - 400 K/uL 196  166  183       Latest Ref Rng & Units 03/19/2022   10:31 AM 03/07/2022    3:20 AM 03/06/2022    5:46 AM  CMP  Glucose 70 - 99 mg/dL 122  53  86   BUN 6 - 20 mg/dL 10  <5  7   Creatinine 0.44 - 1.00 mg/dL 0.61  0.47  0.80   Sodium 135 - 145 mmol/L 140  137  133   Potassium 3.5 - 5.1 mmol/L 3.2  3.8  2.8   Chloride 98 - 111 mmol/L 106  105  95   CO2 22 - 32 mmol/L _0 Calcium 8.9 - 10.3 mg/dL 8.2  7.6  8.4   Total Protein 6.5 - 8.1 g/dL 6.2     Total Bilirubin 0.3 - 1.2 mg/dL 0.7     Alkaline Phos 38 - 126 U/L 61     AST 15 - 41 U/L 24     ALT 0 - 44 U/L 14       DIAGNOSTIC IMAGING:  I have independently reviewed the scans and discussed with the patient. ECHOCARDIOGRAM LIMITED  Result Date: 03/06/2022    ECHOCARDIOGRAM LIMITED REPORT   Patient Name:   Nicole Santos Date of Exam: 03/06/2022 Medical Rec #:  [id number]   Height:       62.0 in Accession #:    [id number]  Weight:       176.4 lb Date of Birth:  1970/08/09    BSA:          1.812 m Patient Age:    50 years    BP:           116/74 mmHg Patient Gender: F           HR:           69 bpm. Exam Location:  [facility] Procedure: Limited Echo Indications:    Pulmonary Embolus I26.09   History:        Patient has prior history of Echocardiogram examinations, most                 recent 02/17/2022. Signs/Symptoms:Dyspnea and Syncope; Risk                 Factors:Hypertension, Dyslipidemia and Diabetes.  Sonographer:    Alvino Chapel RCS Referring Phys: [place]  1. Left ventricular ejection fraction, by estimation, is 55 to 60%. The left ventricle has normal function. The left ventricle has no regional wall motion abnormalities.  2. Right ventricular systolic function is normal. The right ventricular size is normal.  3. The inferior vena cava is normal in size with greater than 50% respiratory variability, suggesting right atrial pressure of 3 mmHg.  4. Limited echo to evaluate LV and RV function FINDINGS  Left Ventricle: Left ventricular ejection fraction, by estimation, is 55 to 60%. The left ventricle has normal function. The left ventricle has no regional wall motion abnormalities. There is no left ventricular hypertrophy. Right Ventricle:  The right ventricular size is normal. Right vetricular wall thickness was not well visualized. Right ventricular systolic function is normal. Venous: The inferior vena cava is normal in size with greater than 50% respiratory variability, suggesting right atrial pressure of 3 mmHg. LEFT VENTRICLE PLAX 2D LVIDd:         4.60 cm LVIDs:         2.90 cm LV PW:         0.70 cm LV IVS:        0.80 cm LVOT diam:     1.90 cm LVOT Area:     2.84 cm  LEFT ATRIUM         Index LA diam:    3.00 cm 1.66 cm/m   AORTA Ao Root diam: 2.90 cm  SHUNTS Systemic Diam: 1.90 cm Carlyle Dolly MD Electronically signed by Carlyle Dolly MD Signature Date/Time: 03/06/2022/4:10:58 PM    Final    US Venous Img Lower Bilateral (DVT)  Result Date: 03/06/2022 CLINICAL DATA:  Shortness of breath with syncope. EXAM: BILATERAL LOWER EXTREMITY VENOUS DOPPLER ULTRASOUND TECHNIQUE: Gray-scale sonography with graded compression, as well as color Doppler and duplex  ultrasound were performed to evaluate the lower extremity deep venous systems from the level of the common femoral vein and including the common femoral, femoral, profunda femoral, popliteal and calf veins including the posterior tibial, peroneal and gastrocnemius veins when visible. The superficial great saphenous vein was also interrogated. Spectral Doppler was utilized to evaluate flow at rest and with distal augmentation maneuvers in the common femoral, femoral and popliteal veins. COMPARISON:  CT abdomen and pelvis 03/06/2022 FINDINGS: RIGHT LOWER EXTREMITY Common Femoral Vein: No evidence of thrombus. Normal compressibility, respiratory phasicity and response to augmentation. Saphenofemoral Junction: No evidence of thrombus. Normal compressibility and flow on color Doppler imaging. Profunda Femoral Vein: No evidence of thrombus. Normal compressibility and flow on color Doppler imaging. Femoral Vein: No evidence of thrombus. Normal compressibility, respiratory phasicity and response to augmentation. Popliteal Vein: No evidence of thrombus. Normal compressibility, respiratory phasicity and response to augmentation. Calf Veins: No evidence of thrombus. Normal compressibility and flow on color Doppler imaging. LEFT LOWER EXTREMITY Common Femoral Vein: No evidence of thrombus. Normal compressibility, respiratory phasicity and response to augmentation. Saphenofemoral Junction: No evidence of thrombus. Normal compressibility and flow on color Doppler imaging. Profunda Femoral Vein: No evidence of thrombus. Normal compressibility and flow on color Doppler imaging. Femoral Vein: No evidence of thrombus. Normal compressibility, respiratory phasicity and response to augmentation. Popliteal Vein: No evidence of thrombus. Normal compressibility, respiratory phasicity and response to augmentation. Calf Veins: No evidence of thrombus. Normal compressibility and flow on color Doppler imaging. Other Findings:  None.  IMPRESSION: No evidence of deep venous thrombosis in either lower extremity. Electronically Signed   By: Markus Daft M.D.   On: 03/06/2022 14:56   CT Angio Chest Pulmonary Embolism (PE) W or WO Contrast  Result Date: 03/06/2022 CLINICAL DATA:  Syncope.  Weakness. EXAM: CT ANGIOGRAPHY CHEST CT ABDOMEN AND PELVIS WITH CONTRAST TECHNIQUE: Multidetector CT imaging of the chest was performed using the standard protocol during bolus administration of intravenous contrast. Multiplanar CT image reconstructions and MIPs were obtained to evaluate the vascular anatomy. Multidetector CT imaging of the abdomen and pelvis was performed using the standard protocol during bolus administration of intravenous contrast. RADIATION DOSE REDUCTION: This exam was performed according to the departmental dose-optimization program which includes automated exposure control, adjustment of the mA and/or kV according to patient size  and/or use of iterative reconstruction technique. CONTRAST:  111m OMNIPAQUE IOHEXOL 350 MG/ML SOLN COMPARISON:  11/13/2021 FINDINGS: CTA CHEST FINDINGS Cardiovascular: Satisfactory opacification of the pulmonary arteries to the segmental level. Subsegmental branching and linear pulmonary artery filling defects seen within 3 vessels of the right lung, marked on series 5. Normal heart size. No pericardial effusion. Mediastinum/Nodes: Cystic density anterior to the trachea measuring 2 cm, attributed to uncomplicated foregut cyst. Lungs/Pleura: Mosaic attenuation of the lungs, likely from small airways disease and air trapping. Musculoskeletal: No acute finding Review of the MIP images confirms the above findings. CT ABDOMEN and PELVIS FINDINGS Hepatobiliary: No focal liver abnormality.Cholelithiasis. No evidence of biliary inflammation or obstruction. Pancreas: Unremarkable. Spleen: Unremarkable. Adrenals/Urinary Tract: Negative adrenals. Mild right hydronephrosis and hydroureter due to a 4 mm UVJ calculus. Distal  urothelial thickening at the right ureter is likely reactive. 2.2 cm left renal cyst. Negative bladder. Stomach/Bowel:  No obstruction. No visible bowel inflammation Vascular/Lymphatic: Notable atheromatous calcification for age affecting the aorta and iliacs. Patent appearance of the left iliac vein stent. No mass or adenopathy. Reproductive:Stable. Other: Unchanged generalized omental reticulation and peritoneal thickening. There is chart history of peritoneal carcinomatosis. Musculoskeletal: Subjective osteopenia. Coarsened appearance of right ilium trabecule and cortex question prior radiotherapy. No radiation administered, Paget's is favored. Review of the MIP images confirms the above findings. Critical Value/emergent results were called by telephone at the time of interpretation on 03/06/2022 at 7:14 am [facility] verbally [facility] these results. IMPRESSION: Chest CT: Scattered subsegmental pulmonary emboli in the right lung. Abdominal CT: 1. Mild right hydroureteronephrosis from a 4 mm UVJ calculus. 2. Unchanged omental disease. 3. Cholelithiasis and other chronic findings described above. Electronically Signed   By: JJorje GuildM.D.   On: 03/06/2022 07:17   CT ABDOMEN PELVIS W CONTRAST  Result Date: 03/06/2022 CLINICAL DATA:  Syncope.  Weakness. EXAM: CT ANGIOGRAPHY CHEST CT ABDOMEN AND PELVIS WITH CONTRAST TECHNIQUE: Multidetector CT imaging of the chest was performed using the standard protocol during bolus administration of intravenous contrast. Multiplanar CT image reconstructions and MIPs were obtained to evaluate the vascular anatomy. Multidetector CT imaging of the abdomen and pelvis was performed using the standard protocol during bolus administration of intravenous contrast. RADIATION DOSE REDUCTION: This exam was performed according to the departmental dose-optimization program which includes automated exposure control, adjustment of the mA and/or kV according to  patient size and/or use of iterative reconstruction technique. CONTRAST:  1040mOMNIPAQUE IOHEXOL 350 MG/ML SOLN COMPARISON:  11/13/2021 FINDINGS: CTA CHEST FINDINGS Cardiovascular: Satisfactory opacification of the pulmonary arteries to the segmental level. Subsegmental branching and linear pulmonary artery filling defects seen within 3 vessels of the right lung, marked on series 5. Normal heart size. No pericardial effusion. Mediastinum/Nodes: Cystic density anterior to the trachea measuring 2 cm, attributed to uncomplicated foregut cyst. Lungs/Pleura: Mosaic attenuation of the lungs, likely from small airways disease and air trapping. Musculoskeletal: No acute finding Review of the MIP images confirms the above findings. CT ABDOMEN and PELVIS FINDINGS Hepatobiliary: No focal liver abnormality.Cholelithiasis. No evidence of biliary inflammation or obstruction. Pancreas: Unremarkable. Spleen: Unremarkable. Adrenals/Urinary Tract: Negative adrenals. Mild right hydronephrosis and hydroureter due to a 4 mm UVJ calculus. Distal urothelial thickening at the right ureter is likely reactive. 2.2 cm left renal cyst. Negative bladder. Stomach/Bowel:  No obstruction. No visible bowel inflammation Vascular/Lymphatic: Notable atheromatous calcification for age affecting the aorta and iliacs. Patent appearance of the left iliac vein stent. No mass or adenopathy.  Reproductive:Stable. Other: Unchanged generalized omental reticulation and peritoneal thickening. There is chart history of peritoneal carcinomatosis. Musculoskeletal: Subjective osteopenia. Coarsened appearance of right ilium trabecule and cortex question prior radiotherapy. No radiation administered, Paget's is favored. Review of the MIP images confirms the above findings. Critical Value/emergent results were called by telephone at the time of interpretation on 03/06/2022 at 7:14 am to provider [facility] , who verbally acknowledged these results. IMPRESSION: Chest  CT: Scattered subsegmental pulmonary emboli in the right lung. Abdominal CT: 1. Mild right hydroureteronephrosis from a 4 mm UVJ calculus. 2. Unchanged omental disease. 3. Cholelithiasis and other chronic findings described above. Electronically Signed   By: Jorje Guild M.D.   On: 03/06/2022 07:17     ASSESSMENT:  High-grade serous carcinoma of peritoneum: - She had developed sharp left-sided abdominal pain since Mother's Day, occasional radiating to the back.  40 pound weight loss in the last 6 months. - Personal history of cervical cancer in 2000, status post chemo plus XRT at [facility].  She has slightly decreased hearing in the left ear since then. - US abdomen (11/08/2021): Cholelithiasis with gallbladder wall thickening and sonographic Murphy sign.  Upper abdominal ascites. - Laparoscopy (11/13/2021): Starting of the omentum as well as the upper abdominal wall and diaphragm.  Liver was within normal limits.  6 L of ascites was evacuated.  Omental and peritoneal biopsies were sent.  Peritoneal implants throughout and a significant portion of omentum and on the small intestine. - CT CAP (11/13/2021): Ascites, extensive peritoneal thickening, nodularity and omental caking.  No evidence of primary malignancy in the chest, abdomen or pelvis.  Small left pleural effusion, nonspecific.  Cholelithiasis. - US pelvis (11/14/2021): Uterus is grossly unremarkable.  Ovaries not visualized. - CA125 (11/13/2021): 1269 - Pathology: Peritoneum and omental biopsy consistent with high-grade serous carcinoma.  HER2 2+ by IHC and negative by FISH. - 3 cycles of carboplatin and paclitaxel from 11/21/2021 through 01/02/2022.    Social/family history: - Lives at home with her husband.  Worked in Scientist, research (medical) at [employer].  Non-smoker. - Maternal grandmother with bladder cancer.  Maternal great grandmother with breast cancer.   PLAN:  High-grade serous carcinoma of peritoneum: - She has completed 4 cycles of  chemotherapy.  Surgery is on hold due to severe malnutrition and weight loss.        -Reviewed labs today which showed normal LFTs.  Albumin improved slightly to 2.5.  CA125 is pending.  If her weight and albumin levels improve, will will consider referring her back to Dr. Berline Lopes.   2.  Peripheral neuropathy: - Neuropathy in the feet has been stable.  3.  Weight loss: - She has not started Megace. - She reported that her appetite has really picked up in the last 7 days.  Taste buds also improved. - She is eating well.  2-3 meals per day. - She gained from 176 at last visit to 184 pounds today. - We will reevaluate her in 4 weeks.  4.  Hypomagnesemia: - Continue magnesium twice daily.  Magnesium is normal today.  5.  Hypokalemia: - Continue potassium 20 mg daily.  6.  Subsegmental pulmonary embolism (Dx 03/06/2022): - Continue Eliquis twice daily.  No bleeding issues.   Orders placed this encounter:  No orders of the defined types were placed in this encounter.     Derek Jack, MD [place] [phone]

## 2022-03-19 NOTE — Progress Notes (Signed)
Nutrition Follow-up:  Patient with carcinoma of peritoneum. She completed 4 cycles neoadjuvant chemotherapy. Plans for debulking surgery under the care of Dr. Berline Lopes.   Met with patient in clinic. She reports altered taste continues to improve. Patient says sweets still do not taste good, but she is fine with that. Patient is eating 2-3 meals/day depending on how she feels after getting up. Patient states she has never been a breakfast person. This morning she ate a bologna burger (thick sliced bologna, scrambled egg, cheese on biscuit). Patient had [facility] for lunch/dinner yesterday (steak, baked potato, broccoli, salad, bread). This was delicious. She is going out for [facility] after appointment today. Patient is working to increase water. She is drinking ~24-30 ounces. Patient has orange juice in the morning to bring her sugar up. She denies nausea, vomiting, diarrhea, constipation.   Medications: reviewed   Labs: K 3.2, glucose 122, albumin 2.5  Anthropometrics: Wt has not been entered at this time. Per pt she weighed 182 lb today in office. This is increased from 176 lb 5.9 oz on 10/3   NUTRITION DIAGNOSIS: Inadequate oral intake improving    INTERVENTION:  Continue strategies for increasing calories and protein with smaller meals more often Educated on importance of protein to support post-op healing. Pt will have protein source with all meals and encouraged high protein snacks in between and at bedtime - pt has handout with ideas Continue working to increase intake of water Patient has contact information    MONITORING, EVALUATION, GOAL: weight trends, intake    NEXT VISIT: To be scheduled

## 2022-03-19 NOTE — Patient Instructions (Signed)
[place] at [facility] Discharge Instructions   You were seen and examined today by Dr. Delton Coombes.  He reviewed the results of your lab work which are normal/stable. Your albumin is improving.  Continue eating as you have been.  We will see you back in month with repeat labs.    Thank you for choosing [place] at [facility] to provide your oncology and hematology care.  To afford each patient quality time with our provider, please arrive at least 15 minutes before your scheduled appointment time.   If you have a lab appointment with the [place] please come in thru the Main Entrance and check in at the main information desk.  You need to re-schedule your appointment should you arrive 10 or more minutes late.  We strive to give you quality time with our providers, and arriving late affects you and other patients whose appointments are after yours.  Also, if you no show three or more times for appointments you may be dismissed from the clinic at the providers discretion.     Again, thank you for choosing [facility].  Our hope is that these requests will decrease the amount of time that you wait before being seen by our physicians.       _____________________________________________________________  Should you have questions after your visit to [facility], please contact our office at [phone] and follow the prompts.  Our office hours are 8:00 a.m. and 4:30 p.m. Monday - Friday.  Please note that voicemails left after 4:00 p.m. may not be returned until the following business day.  We are closed weekends and major holidays.  You do have access to a nurse 24-7, just call the main number to the clinic [phone] and do not press any options, hold on the line and a nurse will answer the phone.    For prescription refill requests, have your pharmacy contact our office and allow 72 hours.    Due to Covid,  you will need to wear a mask upon entering the hospital. If you do not have a mask, a mask will be given to you at the Main Entrance upon arrival. For doctor visits, patients may have 1 support person age 13 or older with them. For treatment visits, patients can not have anyone with them due to social distancing guidelines and our immunocompromised population.

## 2022-03-20 ENCOUNTER — Other Ambulatory Visit: Payer: Self-pay

## 2022-03-20 LAB — CA 125: Cancer Antigen (CA) 125: 27.4 U/mL (ref 0.0–38.1)

## 2022-03-21 ENCOUNTER — Ambulatory Visit: Payer: Commercial Managed Care - PPO | Admitting: General Surgery

## 2022-03-22 ENCOUNTER — Other Ambulatory Visit: Payer: Self-pay | Admitting: *Deleted

## 2022-03-22 DIAGNOSIS — C482 Malignant neoplasm of peritoneum, unspecified: Secondary | ICD-10-CM

## 2022-03-29 ENCOUNTER — Other Ambulatory Visit: Payer: Self-pay | Admitting: Hematology

## 2022-04-01 ENCOUNTER — Other Ambulatory Visit: Payer: Self-pay

## 2022-04-02 ENCOUNTER — Encounter: Payer: Self-pay | Admitting: Family Medicine

## 2022-04-02 ENCOUNTER — Ambulatory Visit (INDEPENDENT_AMBULATORY_CARE_PROVIDER_SITE_OTHER): Payer: Commercial Managed Care - PPO | Admitting: Family Medicine

## 2022-04-02 VITALS — BP 132/79 | HR 108 | Temp 97.1°F | Ht 62.0 in | Wt 185.4 lb

## 2022-04-02 DIAGNOSIS — I2694 Multiple subsegmental pulmonary emboli without acute cor pulmonale: Secondary | ICD-10-CM | POA: Diagnosis not present

## 2022-04-02 DIAGNOSIS — C482 Malignant neoplasm of peritoneum, unspecified: Secondary | ICD-10-CM

## 2022-04-02 DIAGNOSIS — E119 Type 2 diabetes mellitus without complications: Secondary | ICD-10-CM

## 2022-04-02 DIAGNOSIS — E876 Hypokalemia: Secondary | ICD-10-CM

## 2022-04-02 DIAGNOSIS — G629 Polyneuropathy, unspecified: Secondary | ICD-10-CM

## 2022-04-02 NOTE — Patient Instructions (Signed)
A1c 5.2 in 02/2022.  No need to add meds Will consider Glipizide orally if needed.  Let me know if fasting sugars are >150 consistently and we can considering adding.  Alpha lipoic acid '600mg'$  daily (OTC supplement) for neuropathy Let me know how this is going in 2-3 weeks. If no improvement, can consider Gabapentin/ Lyrica.

## 2022-04-02 NOTE — Progress Notes (Signed)
Subjective: GB:TDVVOHYWV care, peritoneal cancer HPI: Nicole Santos is a 51 y.o. female presenting to clinic today for:  1.  Cancer/ Type 2 DM/ neuropathy Patient is under the care of Dr. Delton Coombes for peritoneal cancer, which was diagnosed back in June.  She is waiting to get her blood work to certain level in efforts to get surgical intervention with Dr. Berline Lopes.  She has had her chemotherapy paused for a couple of months due to her labs not being quite at goal.  She notes that during this time her taste receptors have gotten back to some level of normalcy and she has been able to eat again.  She will be seeing Dr. Delton Coombes again in about 2 weeks and they will decide whether or not she will resume the chemotherapy or not.  She monitors her blood sugars closely and notes that they run less than 190 and typically on the lower 100s side.  She was previously on Ozempic but could not tolerate metformin.  She still has Ozempic in her fridge but was not aware that that could exacerbate nausea, vomiting and abdominal pain.  She does report having neuropathy secondary to the chemotherapy and wonders if there is anything that we can do about that.  The neuropathy has gotten slightly better after her chemotherapy regimen was adjusted but she continues to experience in the tips of her fingers and her lower extremities.  Not currently treated with anything.    Past Medical History:  Diagnosis Date   Acute respiratory disease due to COVID-19 virus 02/02/2019   Anxiety    Cancer ([place])    Depression    Dyspnea    Dysrhythmia    History of diabetes mellitus    Resolved in Sept 2023 with weight loss.   Hypertension    Ovarian cancer ([place]) 11/14/2021   Peritoneal carcinomatosis ([place])    Pneumonia    Sleep apnea    doesn't use CPAP   Tachycardia    Past Surgical History:  Procedure Laterality Date   KNEE SURGERY Right    Arthroscopic   LAPAROSCOPIC ABDOMINAL EXPLORATION N/A 11/13/2021    Procedure: Exploratory laparoscopy;  Surgeon: Aviva Signs, MD;  Location: [facility];  Service: General;  Laterality: N/A;   PORTACATH PLACEMENT Left 11/15/2021   Procedure: INSERTION PORT-A-CATH;  Surgeon: Aviva Signs, MD;  Location: [facility];  Service: General;  Laterality: Left;   TONSILLECTOMY AND ADENOIDECTOMY     Social History   Socioeconomic History   Marital status: Married    Spouse name: Not on file   Number of children: Not on file   Years of education: Not on file   Highest education level: Not on file  Occupational History   Occupation: Unemployed  Tobacco Use   Smoking status: Never   Smokeless tobacco: Never  Vaping Use   Vaping Use: Never used  Substance and Sexual Activity   Alcohol use: No   Drug use: No   Sexual activity: Not Currently  Other Topics Concern   Not on file  Social History Narrative   Not on file   Social Determinants of Health   Financial Resource Strain: Not on file  Food Insecurity: No Food Insecurity (03/06/2022)   Hunger Vital Sign    Worried About Running Out of Food in the Last Year: Never true    Ran Out of Food in the Last Year: Never true  Transportation Needs: No Transportation Needs (03/06/2022)   [place] - Transportation  Lack of Transportation (Medical): No    Lack of Transportation (Non-Medical): No  Physical Activity: Not on file  Stress: Not on file  Social Connections: Not on file  Intimate Partner Violence: Not At Risk (03/06/2022)   Humiliation, Afraid, Rape, and Kick questionnaire    Fear of Current or Ex-Partner: No    Emotionally Abused: No    Physically Abused: No    Sexually Abused: No   No outpatient medications have been marked as taking for the 04/02/22 encounter (Appointment) with Janora Norlander, DO.   Family History  Problem Relation Age of Onset   Hypertension Mother    Bladder Cancer Maternal Grandmother    Breast cancer Maternal Great-grandmother    Colon cancer Neg Hx    Ovarian cancer Neg  Hx    Endometrial cancer Neg Hx    Pancreatic cancer Neg Hx    Prostate cancer Neg Hx    Allergies  Allergen Reactions   Augmentin [Amoxicillin-Pot Clavulanate] Diarrhea and Nausea And Vomiting   Doxycycline     Abdominal cramps.   Topamax [Topiramate] Nausea And Vomiting   ROS: Per HPI  Objective: Office vital signs reviewed. BP 132/79   Pulse (!) 108   Temp (!) 97.1 F (36.2 C)   Ht '5\' 2"'$  (1.575 m)   Wt 185 lb 6.4 oz (84.1 kg)   SpO2 99%   BMI 33.91 kg/m   Physical Examination:  General: Awake, alert, nontoxic-appearing female, No acute distress HEENT: Sclera white.  Cardio: regular rate and rhythm, S1S2 heard, no murmurs appreciated Pulm: clear to auscultation bilaterally, no wheezes, rhonchi or rales; normal work of breathing on room air MSK: Ambulating independently  Assessment/ Plan: 51 y.o. female   Primary peritoneal carcinomatosis ([place])  Hypokalemia  Hypocalcemia  Multiple subsegmental pulmonary emboli without acute cor pulmonale (HCC)  Neuropathy  Diet-controlled diabetes mellitus ([place])  I reviewed the notes by her oncologist.  Continue potassium, calcium and magnesium supplements as directed  Continue Eliquis as directed for history of DVT and PE.  For the neuropathy I have suggested alpha lipoic acid 600 mg daily.  We certainly could consider gabapentin or Lyrica but we discussed the sedating side effect of those medications.  She will contact me in the next 2 to 3 weeks to let me know how the alpha lipoic acid is going and if she wishes to pursue one of the prescription medications for neuropathy.  I reviewed her last A1c which was 5.2.  We will consider something like glipizide if needed for blood sugars that are consistently above 150.  She will continue monitoring these.  Otherwise, we will plan to see each other in 6 months, sooner if concerns arise  Tara Rud Windell Moulding, [place] [phone]

## 2022-04-03 ENCOUNTER — Other Ambulatory Visit: Payer: Self-pay

## 2022-04-12 ENCOUNTER — Encounter: Payer: Self-pay | Admitting: Family Medicine

## 2022-04-16 ENCOUNTER — Inpatient Hospital Stay: Payer: Commercial Managed Care - PPO | Attending: Gynecologic Oncology

## 2022-04-16 ENCOUNTER — Inpatient Hospital Stay: Payer: Commercial Managed Care - PPO | Admitting: Hematology

## 2022-04-16 VITALS — BP 125/87 | HR 97 | Temp 97.7°F | Resp 18 | Ht 62.0 in | Wt 191.0 lb

## 2022-04-16 DIAGNOSIS — I2693 Single subsegmental pulmonary embolism without acute cor pulmonale: Secondary | ICD-10-CM | POA: Insufficient documentation

## 2022-04-16 DIAGNOSIS — C482 Malignant neoplasm of peritoneum, unspecified: Secondary | ICD-10-CM

## 2022-04-16 DIAGNOSIS — Z993 Dependence on wheelchair: Secondary | ICD-10-CM | POA: Insufficient documentation

## 2022-04-16 DIAGNOSIS — G629 Polyneuropathy, unspecified: Secondary | ICD-10-CM | POA: Diagnosis not present

## 2022-04-16 DIAGNOSIS — Z7901 Long term (current) use of anticoagulants: Secondary | ICD-10-CM | POA: Diagnosis not present

## 2022-04-16 DIAGNOSIS — Z79899 Other long term (current) drug therapy: Secondary | ICD-10-CM | POA: Insufficient documentation

## 2022-04-16 DIAGNOSIS — E876 Hypokalemia: Secondary | ICD-10-CM | POA: Insufficient documentation

## 2022-04-16 DIAGNOSIS — R739 Hyperglycemia, unspecified: Secondary | ICD-10-CM

## 2022-04-16 LAB — COMPREHENSIVE METABOLIC PANEL
ALT: 16 U/L (ref 0–44)
AST: 20 U/L (ref 15–41)
Albumin: 3.2 g/dL — ABNORMAL LOW (ref 3.5–5.0)
Alkaline Phosphatase: 49 U/L (ref 38–126)
Anion gap: 6 (ref 5–15)
BUN: 12 mg/dL (ref 6–20)
CO2: 23 mmol/L (ref 22–32)
Calcium: 8.6 mg/dL — ABNORMAL LOW (ref 8.9–10.3)
Chloride: 110 mmol/L (ref 98–111)
Creatinine, Ser: 0.52 mg/dL (ref 0.44–1.00)
GFR, Estimated: >60 mL/min (ref >60)
Glucose, Bld: 119 mg/dL — ABNORMAL HIGH (ref 70–99)
Potassium: 3.7 mmol/L (ref 3.5–5.1)
Sodium: 139 mmol/L (ref 135–145)
Total Bilirubin: 0.7 mg/dL (ref 0.3–1.2)
Total Protein: 7 g/dL (ref 6.5–8.1)

## 2022-04-16 LAB — CBC WITH DIFFERENTIAL/PLATELET
Abs Immature Granulocytes: 0.01 10*3/uL (ref 0.00–0.07)
Basophils Absolute: 0 10*3/uL (ref 0.0–0.1)
Basophils Relative: 0 %
Eosinophils Absolute: 0.1 10*3/uL (ref 0.0–0.5)
Eosinophils Relative: 1 %
HCT: 34.1 % — ABNORMAL LOW (ref 36.0–46.0)
Hemoglobin: 11.3 g/dL — ABNORMAL LOW (ref 12.0–15.0)
Immature Granulocytes: 0 %
Lymphocytes Relative: 24 %
Lymphs Abs: 1.2 10*3/uL (ref 0.7–4.0)
MCH: 35.4 pg — ABNORMAL HIGH (ref 26.0–34.0)
MCHC: 33.1 g/dL (ref 30.0–36.0)
MCV: 106.9 fL — ABNORMAL HIGH (ref 80.0–100.0)
Monocytes Absolute: 0.4 10*3/uL (ref 0.1–1.0)
Monocytes Relative: 8 %
Neutro Abs: 3.4 10*3/uL (ref 1.7–7.7)
Neutrophils Relative %: 67 %
Platelets: 244 10*3/uL (ref 150–400)
RBC: 3.19 MIL/uL — ABNORMAL LOW (ref 3.87–5.11)
RDW: 15.3 % (ref 11.5–15.5)
WBC: 5 10*3/uL (ref 4.0–10.5)
nRBC: 0 % (ref 0.0–0.2)

## 2022-04-16 LAB — HEMOGLOBIN A1C
Hgb A1c MFr Bld: 4.2 % — ABNORMAL LOW (ref 4.8–5.6)
Mean Plasma Glucose: 73.84 mg/dL

## 2022-04-16 LAB — MAGNESIUM: Magnesium: 1.8 mg/dL (ref 1.7–2.4)

## 2022-04-16 MED ORDER — DULOXETINE HCL 30 MG PO CPEP: 30.0000 mg | ORAL_CAPSULE | Freq: Every day | ORAL | 3 refills | Status: DC

## 2022-04-16 MED ORDER — SODIUM CHLORIDE 0.9% FLUSH: 10.0000 mL | INTRAVENOUS | Status: AC | PRN

## 2022-04-16 MED ORDER — HEPARIN SOD (PORK) LOCK FLUSH 100 UNIT/ML IV SOLN: 500.0000 [IU] | Freq: Once | INTRAVENOUS | Status: AC

## 2022-04-16 NOTE — Patient Instructions (Addendum)
[place] at [facility] Discharge Instructions   You were seen and examined today by Dr. Delton Coombes.  He reviewed the results of your lab work which are normal/stable. The cancer tumor marker from today is pending. The results from the tumor marker done 4 weeks ago has come down to normal.   We will make a referral back to Dr. Berline Lopes.   We will repeat lab work and scan in 6 weeks.    Thank you for choosing [place] at [facility] to provide your oncology and hematology care.  To afford each patient quality time with our provider, please arrive at least 15 minutes before your scheduled appointment time.   If you have a lab appointment with the [place] please come in thru the Main Entrance and check in at the main information desk.  You need to re-schedule your appointment should you arrive 10 or more minutes late.  We strive to give you quality time with our providers, and arriving late affects you and other patients whose appointments are after yours.  Also, if you no show three or more times for appointments you may be dismissed from the clinic at the providers discretion.     Again, thank you for choosing [facility].  Our hope is that these requests will decrease the amount of time that you wait before being seen by our physicians.       _____________________________________________________________  Should you have questions after your visit to [facility], please contact our office at [phone] and follow the prompts.  Our office hours are 8:00 a.m. and 4:30 p.m. Monday - Friday.  Please note that voicemails left after 4:00 p.m. may not be returned until the following business day.  We are closed weekends and major holidays.  You do have access to a nurse 24-7, just call the main number to the clinic [phone] and do not press any options, hold on the line and a nurse will answer the phone.    For  prescription refill requests, have your pharmacy contact our office and allow 72 hours.    Due to Covid, you will need to wear a mask upon entering the hospital. If you do not have a mask, a mask will be given to you at the Main Entrance upon arrival. For doctor visits, patients may have 1 support person age 51 or older with them. For treatment visits, patients can not have anyone with them due to social distancing guidelines and our immunocompromised population.

## 2022-04-16 NOTE — Progress Notes (Signed)
[place], [place] [address]   CLINIC:  Medical Oncology/Hematology  PCP:  Janora Norlander, DO [address] [place] [address] [phone]   REASON FOR VISIT:  Follow-up for high-grade serous carcinoma of peritoneum  PRIOR THERAPY: Carboplatin and paclitaxel x 4 cycles, completed on 01/23/2022  NGS Results: not done  CURRENT THERAPY: Observation  BRIEF ONCOLOGIC HISTORY:  Oncology History  Primary peritoneal carcinomatosis ([place])  11/17/2021 Initial Diagnosis   Primary peritoneal carcinomatosis ([place])   11/21/2021 - 01/25/2022 Chemotherapy   Patient is on Treatment Plan : OVARIAN Carboplatin (AUC 6) / Paclitaxel (175) q21d x 6 cycles     11/21/2021 -  Chemotherapy   Patient is on Treatment Plan : OVARIAN Carboplatin (AUC 6) + Paclitaxel (175) q21d X 6 Cycles       CANCER STAGING:  Cancer Staging  Primary peritoneal carcinomatosis ([place]) Staging form: Ovary, Fallopian Tube, and Primary Peritoneal Carcinoma, AJCC 8th Edition - Clinical stage from 11/17/2021: FIGO Stage IIIC (cT3c, cM0) - Unsigned   INTERVAL HISTORY:  Ms. BIRD TAILOR, a 51 y.o. female, seen for follow-up of ovarian cancer.  She reports that she has been eating better, 2-3 meals per day.  She is unable to drink any Ensure or boost.  She has gained 6 pounds since last visit.  She reports slight worsening of pins-and-needles sensation which is constant in the feet and also reports numbness in the legs and stomach area.  She started having left hip pain last night after walking in [place] yesterday.  REVIEW OF SYSTEMS:  Review of Systems  Constitutional:  Negative for appetite change, fatigue and unexpected weight change.  Genitourinary:  Negative for frequency.   Neurological:  Positive for numbness (In the fingertips and feet slightly worse since last visit).  Psychiatric/Behavioral:  Negative for sleep disturbance.   All other systems reviewed and are  negative.   PAST MEDICAL/SURGICAL HISTORY:  Past Medical History:  Diagnosis Date   Acute respiratory disease due to COVID-19 virus 02/02/2019   Anxiety    Cancer ([place])    Depression    Dyspnea    Dysrhythmia    History of diabetes mellitus    Resolved in Sept 2023 with weight loss.   Hypertension    Ovarian cancer ([place]) 11/14/2021   Peritoneal carcinomatosis ([place])    Pneumonia    Sleep apnea    doesn't use CPAP   Tachycardia    Past Surgical History:  Procedure Laterality Date   KNEE SURGERY Right    Arthroscopic   LAPAROSCOPIC ABDOMINAL EXPLORATION N/A 11/13/2021   Procedure: Exploratory laparoscopy;  Surgeon: Aviva Signs, MD;  Location: [facility];  Service: General;  Laterality: N/A;   PORTACATH PLACEMENT Left 11/15/2021   Procedure: INSERTION PORT-A-CATH;  Surgeon: Aviva Signs, MD;  Location: [facility];  Service: General;  Laterality: Left;   TONSILLECTOMY AND ADENOIDECTOMY      SOCIAL HISTORY:  Social History   Socioeconomic History   Marital status: Married    Spouse name: Not on file   Number of children: Not on file   Years of education: Not on file   Highest education level: Not on file  Occupational History   Occupation: Unemployed  Tobacco Use   Smoking status: Never   Smokeless tobacco: Never  Vaping Use   Vaping Use: Never used  Substance and Sexual Activity   Alcohol use: No   Drug use: No   Sexual activity: Not  Currently  Other Topics Concern   Not on file  Social History Narrative   Not on file   Social Determinants of Health   Financial Resource Strain: Not on file  Food Insecurity: No Food Insecurity (03/06/2022)   Hunger Vital Sign    Worried About Running Out of Food in the Last Year: Never true    Ran Out of Food in the Last Year: Never true  Transportation Needs: No Transportation Needs (03/06/2022)   PRAPARE - Hydrologist (Medical): No    Lack of Transportation (Non-Medical): No  Physical Activity: Not  on file  Stress: Not on file  Social Connections: Not on file  Intimate Partner Violence: Not At Risk (03/06/2022)   Humiliation, Afraid, Rape, and Kick questionnaire    Fear of Current or Ex-Partner: No    Emotionally Abused: No    Physically Abused: No    Sexually Abused: No    FAMILY HISTORY:  Family History  Problem Relation Age of Onset   Hypertension Mother    Bladder Cancer Maternal Grandmother    Breast cancer Maternal Great-grandmother    Colon cancer Neg Hx    Ovarian cancer Neg Hx    Endometrial cancer Neg Hx    Pancreatic cancer Neg Hx    Prostate cancer Neg Hx     CURRENT MEDICATIONS:  Current Outpatient Medications  Medication Sig Dispense Refill   apixaban (ELIQUIS) 5 MG TABS tablet Take 2 tablets (10 mg total) by mouth 2 (two) times daily. Then on 03/14/22, take 1 tab (5 mg) two times daily. 74 tablet 1   atorvastatin (LIPITOR) 20 MG tablet Take 1 tablet (20 mg total) by mouth daily. (Patient taking differently: Take 20 mg by mouth at bedtime.) 90 tablet 1   DULoxetine (CYMBALTA) 30 MG capsule Take 1 capsule (30 mg total) by mouth daily. Take 1 capsule by mouth x 7 days, then increase to 2 capsules daily 90 capsule 3   magnesium oxide (MAG-OX) 400 (240 Mg) MG tablet TAKE 1 TABLET BY MOUTH TWICE A DAY 60 tablet 2   Multiple Vitamin (MULTIVITAMIN) capsule Take 1 capsule by mouth daily.     Potassium 99 MG TABS Take 99 mg by mouth at bedtime.     No current facility-administered medications for this visit.   Facility-Administered Medications Ordered in Other Visits  Medication Dose Route Frequency Provider Last Rate Last Admin   heparin lock flush 100 unit/mL  500 Units Intravenous Once Derek Jack, MD       sodium chloride flush (NS) 0.9 % injection 10 mL  10 mL Intravenous PRN Derek Jack, MD        ALLERGIES:  Allergies  Allergen Reactions   Augmentin [Amoxicillin-Pot Clavulanate] Diarrhea and Nausea And Vomiting   Doxycycline      Abdominal cramps.   Topamax [Topiramate] Nausea And Vomiting    PHYSICAL EXAM:  Performance status (ECOG): 1 - Symptomatic but completely ambulatory  There were no vitals filed for this visit.  Wt Readings from Last 3 Encounters:  04/16/22 191 lb (86.6 kg)  04/02/22 185 lb 6.4 oz (84.1 kg)  03/06/22 176 lb 5.9 oz (80 kg)   Physical Exam Vitals reviewed.  Constitutional:      Appearance: Normal appearance. She is obese.     Comments: In wheelchair  Cardiovascular:     Rate and Rhythm: Normal rate and regular rhythm.     Pulses: Normal pulses.     Heart  sounds: Normal heart sounds.  Pulmonary:     Effort: Pulmonary effort is normal.     Breath sounds: Normal breath sounds.  Abdominal:     Palpations: Abdomen is soft. There is no hepatomegaly, splenomegaly or mass.     Tenderness: There is no abdominal tenderness.  Neurological:     General: No focal deficit present.     Mental Status: She is alert and oriented to person, place, and time.  Psychiatric:        Mood and Affect: Mood normal.        Behavior: Behavior normal.     LABORATORY DATA:  I have reviewed the labs as listed.     Latest Ref Rng & Units 04/16/2022    9:25 AM 03/19/2022   10:31 AM 03/07/2022    3:20 AM  CBC  WBC 4.0 - 10.5 K/uL 5.0  4.3  3.2   Hemoglobin 12.0 - 15.0 g/dL 11.3  10.4  9.2   Hematocrit 36.0 - 46.0 % 34.1  31.7  27.2   Platelets 150 - 400 K/uL 244  196  166       Latest Ref Rng & Units 04/16/2022    9:25 AM 03/19/2022   10:31 AM 03/07/2022    3:20 AM  CMP  Glucose 70 - 99 mg/dL 119  122  53   BUN 6 - 20 mg/dL 12  10  <5   Creatinine 0.44 - 1.00 mg/dL 0.52  0.61  0.47   Sodium 135 - 145 mmol/L 139  140  137   Potassium 3.5 - 5.1 mmol/L 3.7  3.2  3.8   Chloride 98 - 111 mmol/L 110  106  105   CO2 22 - 32 mmol/L _0 Calcium 8.9 - 10.3 mg/dL 8.6  8.2  7.6   Total Protein 6.5 - 8.1 g/dL 7.0  6.2    Total Bilirubin 0.3 - 1.2 mg/dL 0.7  0.7    Alkaline Phos 38 - 126 U/L 49   61    AST 15 - 41 U/L 20  24    ALT 0 - 44 U/L 16  14      DIAGNOSTIC IMAGING:  I have independently reviewed the scans and discussed with the patient. No results found.   ASSESSMENT:  High-grade serous carcinoma of peritoneum: - She had developed sharp left-sided abdominal pain since Mother's Day, occasional radiating to the back.  40 pound weight loss in the last 6 months. - Personal history of cervical cancer in 2000, status post chemo plus XRT at [facility].  She has slightly decreased hearing in the left ear since then. - US abdomen (11/08/2021): Cholelithiasis with gallbladder wall thickening and sonographic Murphy sign.  Upper abdominal ascites. - Laparoscopy (11/13/2021): Starting of the omentum as well as the upper abdominal wall and diaphragm.  Liver was within normal limits.  6 L of ascites was evacuated.  Omental and peritoneal biopsies were sent.  Peritoneal implants throughout and a significant portion of omentum and on the small intestine. - CT CAP (11/13/2021): Ascites, extensive peritoneal thickening, nodularity and omental caking.  No evidence of primary malignancy in the chest, abdomen or pelvis.  Small left pleural effusion, nonspecific.  Cholelithiasis. - US pelvis (11/14/2021): Uterus is grossly unremarkable.  Ovaries not visualized. - CA125 (11/13/2021): 1269 - Pathology: Peritoneum and omental biopsy consistent with high-grade serous carcinoma.  HER2 2+ by IHC and negative by FISH. - 3 cycles of carboplatin  and paclitaxel from 11/21/2021 through 01/02/2022.    Social/family history: - Lives at home with her husband.  Worked in Scientist, research (medical) at [employer].  Non-smoker. - Maternal grandmother with bladder cancer.  Maternal great grandmother with breast cancer.   PLAN:  High-grade serous carcinoma of peritoneum: -She has completed 4 cycles of chemotherapy.  Initially surgery was held due to poor nutrition and low albumin.  -On 03/06/2022 she was diagnosed with subsegmental  pulmonary embolism. - She has gained weight and is eating well. - Unfortunately she cannot have surgery until 12 weeks after the diagnosis of pulmonary embolism. - Reviewed labs today which showed normal LFTs and creatinine.  Albumin improved to 3.2 from 2.5.  CBC is also better.  Last CA125 was 27.4 on 03/19/2022. - CA125 from today is pending. - We will closely follow-up the CA125 level.  If it goes up, we will schedule CT scan of the abdomen and pelvis in the next 2 weeks. - If the CA125 continues to be in the normal range, will rescan her in 4 to 6 weeks.   2.  Peripheral neuropathy: - Neuropathy with pins-and-needles sensation in the feet has gotten worse.  Numbness in the legs and stomach is new. - We will start her on Cymbalta 30 mg for 7 days followed by 60 mg daily.  3.  Weight loss: - She has never started Megace.  However she is eating better.  She is eating 2-3 meals per day.  She cannot tolerate Ensure or boost. - She gained about 6 pounds since last visit.  Albumin also improved to 3.2.  4.  Hypomagnesemia: - Continue magnesium twice daily.  Magnesium level is normal.  5.  Hypokalemia: - Continue potassium 20 mEq daily.  Potassium is normal.  6.  Subsegmental pulmonary embolism (Dx 03/06/2022): - Continue Eliquis twice daily.  No bleeding issues.   Orders placed this encounter:  Orders Placed This Encounter  Procedures   CT Abdomen Pelvis [place], MD [place] [phone]

## 2022-04-17 LAB — CA 125: Cancer Antigen (CA) 125: 13.4 U/mL (ref 0.0–38.1)

## 2022-05-24 ENCOUNTER — Inpatient Hospital Stay: Payer: Commercial Managed Care - PPO | Attending: Gynecologic Oncology

## 2022-05-24 ENCOUNTER — Ambulatory Visit (HOSPITAL_COMMUNITY)
Admission: RE | Admit: 2022-05-24 | Discharge: 2022-05-24 | Disposition: A | Discharge status: Home / Self Care | Payer: Commercial Managed Care - PPO | Source: Ambulatory Visit | Attending: Hematology | Admitting: Hematology

## 2022-05-24 VITALS — BP 126/73 | HR 90 | Temp 97.6°F | Resp 18

## 2022-05-24 DIAGNOSIS — R109 Unspecified abdominal pain: Secondary | ICD-10-CM | POA: Insufficient documentation

## 2022-05-24 DIAGNOSIS — Z8541 Personal history of malignant neoplasm of cervix uteri: Secondary | ICD-10-CM | POA: Diagnosis not present

## 2022-05-24 DIAGNOSIS — Z86718 Personal history of other venous thrombosis and embolism: Secondary | ICD-10-CM | POA: Diagnosis not present

## 2022-05-24 DIAGNOSIS — C482 Malignant neoplasm of peritoneum, unspecified: Secondary | ICD-10-CM | POA: Insufficient documentation

## 2022-05-24 DIAGNOSIS — E876 Hypokalemia: Secondary | ICD-10-CM | POA: Diagnosis not present

## 2022-05-24 DIAGNOSIS — Z923 Personal history of irradiation: Secondary | ICD-10-CM | POA: Diagnosis not present

## 2022-05-24 DIAGNOSIS — Z79899 Other long term (current) drug therapy: Secondary | ICD-10-CM | POA: Diagnosis not present

## 2022-05-24 DIAGNOSIS — Z95828 Presence of other vascular implants and grafts: Secondary | ICD-10-CM

## 2022-05-24 DIAGNOSIS — Z9221 Personal history of antineoplastic chemotherapy: Secondary | ICD-10-CM | POA: Diagnosis not present

## 2022-05-24 DIAGNOSIS — Z7901 Long term (current) use of anticoagulants: Secondary | ICD-10-CM | POA: Diagnosis not present

## 2022-05-24 DIAGNOSIS — G629 Polyneuropathy, unspecified: Secondary | ICD-10-CM | POA: Diagnosis not present

## 2022-05-24 LAB — COMPREHENSIVE METABOLIC PANEL
ALT: 11 U/L (ref 0–44)
AST: 17 U/L (ref 15–41)
Albumin: 3.5 g/dL (ref 3.5–5.0)
Alkaline Phosphatase: 56 U/L (ref 38–126)
Anion gap: 8 (ref 5–15)
BUN: 15 mg/dL (ref 6–20)
CO2: 23 mmol/L (ref 22–32)
Calcium: 8.9 mg/dL (ref 8.9–10.3)
Chloride: 107 mmol/L (ref 98–111)
Creatinine, Ser: 0.51 mg/dL (ref 0.44–1.00)
GFR, Estimated: >60 mL/min (ref >60)
Glucose, Bld: 101 mg/dL — ABNORMAL HIGH (ref 70–99)
Potassium: 3.8 mmol/L (ref 3.5–5.1)
Sodium: 138 mmol/L (ref 135–145)
Total Bilirubin: 0.9 mg/dL (ref 0.3–1.2)
Total Protein: 6.7 g/dL (ref 6.5–8.1)

## 2022-05-24 LAB — CBC WITH DIFFERENTIAL/PLATELET
Abs Immature Granulocytes: 0.01 10*3/uL (ref 0.00–0.07)
Basophils Absolute: 0 10*3/uL (ref 0.0–0.1)
Basophils Relative: 0 %
Eosinophils Absolute: 0.1 10*3/uL (ref 0.0–0.5)
Eosinophils Relative: 2 %
HCT: 37.2 % (ref 36.0–46.0)
Hemoglobin: 12.5 g/dL (ref 12.0–15.0)
Immature Granulocytes: 0 %
Lymphocytes Relative: 28 %
Lymphs Abs: 1.5 10*3/uL (ref 0.7–4.0)
MCH: 33.6 pg (ref 26.0–34.0)
MCHC: 33.6 g/dL (ref 30.0–36.0)
MCV: 100 fL (ref 80.0–100.0)
Monocytes Absolute: 0.5 10*3/uL (ref 0.1–1.0)
Monocytes Relative: 9 %
Neutro Abs: 3.3 10*3/uL (ref 1.7–7.7)
Neutrophils Relative %: 61 %
Platelets: 250 10*3/uL (ref 150–400)
RBC: 3.72 MIL/uL — ABNORMAL LOW (ref 3.87–5.11)
RDW: 11.8 % (ref 11.5–15.5)
WBC: 5.5 10*3/uL (ref 4.0–10.5)
nRBC: 0 % (ref 0.0–0.2)

## 2022-05-24 LAB — MAGNESIUM: Magnesium: 1.8 mg/dL (ref 1.7–2.4)

## 2022-05-24 MED ORDER — HEPARIN SOD (PORK) LOCK FLUSH 100 UNIT/ML IV SOLN
INTRAVENOUS | Status: AC
  Filled 2022-05-24: qty 5

## 2022-05-24 MED ORDER — SODIUM CHLORIDE 0.9% FLUSH
10.0000 mL | Freq: Once | INTRAVENOUS | Status: AC
  Administered 2022-05-24: 10 mL via INTRAVENOUS

## 2022-05-24 MED ORDER — IOHEXOL 300 MG/ML  SOLN
100.0000 mL | Freq: Once | INTRAMUSCULAR | Status: AC | PRN
  Administered 2022-05-24: 100 mL via INTRAVENOUS

## 2022-05-24 NOTE — Progress Notes (Signed)
Patient accessed for CT scan. Port flushed with good blood return noted, labs drawn. No bruising or swelling at site. Patient discharged in satisfactory condition. VVS stable with no signs or symptoms of distressed noted.

## 2022-05-26 LAB — CA 125: Cancer Antigen (CA) 125: 20.4 U/mL (ref 0.0–38.1)

## 2022-05-31 ENCOUNTER — Inpatient Hospital Stay: Payer: Commercial Managed Care - PPO | Admitting: Hematology

## 2022-05-31 VITALS — BP 111/76 | HR 82 | Temp 99.0°F | Resp 17 | Ht 62.0 in | Wt 185.4 lb

## 2022-05-31 DIAGNOSIS — C569 Malignant neoplasm of unspecified ovary: Secondary | ICD-10-CM | POA: Diagnosis not present

## 2022-05-31 DIAGNOSIS — C482 Malignant neoplasm of peritoneum, unspecified: Secondary | ICD-10-CM | POA: Diagnosis not present

## 2022-05-31 NOTE — Progress Notes (Signed)
[place], [place] [address]   CLINIC:  Medical Oncology/Hematology  PCP:  Janora Norlander, DO [address] [place] [address] [phone]   REASON FOR VISIT:  Follow-up for high-grade serous carcinoma of peritoneum  PRIOR THERAPY: Carboplatin and paclitaxel x 4 cycles, completed on 01/23/2022  NGS Results: not done  CURRENT THERAPY: Observation  BRIEF ONCOLOGIC HISTORY:  Oncology History  Primary peritoneal carcinomatosis ([facility])  11/17/2021 Initial Diagnosis   Primary peritoneal carcinomatosis ([facility])   11/21/2021 - 01/25/2022 Chemotherapy   Patient is on Treatment Plan : OVARIAN Carboplatin (AUC 6) / Paclitaxel (175) q21d x 6 cycles     11/21/2021 -  Chemotherapy   Patient is on Treatment Plan : OVARIAN Carboplatin (AUC 6) + Paclitaxel (175) q21d X 6 Cycles       CANCER STAGING:  Cancer Staging  Primary peritoneal carcinomatosis ([facility]) Staging form: Ovary, Fallopian Tube, and Primary Peritoneal Carcinoma, AJCC 8th Edition - Clinical stage from 11/17/2021: FIGO Stage IIIC (cT3c, cM0) - Unsigned   INTERVAL HISTORY:  Nicole Santos, a 51 y.o. female, seen for follow-up of ovarian cancer.  She reported new onset abdominal pain in the epigastric region followed by a distention and vomiting which she goes on over 2 days with each episode.  She had 6 such episodes in the last 6 weeks.  She lost about 6 pounds since last visit on 04/16/2022 due to decreased eating during these episodes.  She reports that these episodes are similar to when she was diagnosed.  REVIEW OF SYSTEMS:  Review of Systems  Constitutional:  Negative for appetite change, fatigue and unexpected weight change.  Gastrointestinal:  Positive for abdominal pain.  Genitourinary:  Negative for frequency.   Neurological:  Positive for numbness (In the fingertips and feet slightly worse since last visit).  Psychiatric/Behavioral:  Negative for sleep disturbance.   All  other systems reviewed and are negative.   PAST MEDICAL/SURGICAL HISTORY:  Past Medical History:  Diagnosis Date   Acute respiratory disease due to COVID-19 virus 02/02/2019   Anxiety    Cancer ([facility] [facility])    Depression    Dyspnea    Dysrhythmia    History of diabetes mellitus    Resolved in Sept 2023 with weight loss.   Hypertension    Ovarian cancer ([facility]) 11/14/2021   Peritoneal carcinomatosis ([facility])    Pneumonia    Sleep apnea    doesn't use CPAP   Tachycardia    Past Surgical History:  Procedure Laterality Date   KNEE SURGERY Right    Arthroscopic   LAPAROSCOPIC ABDOMINAL EXPLORATION N/A 11/13/2021   Procedure: Exploratory laparoscopy;  Surgeon: Aviva Signs, MD;  Location: [facility];  Service: General;  Laterality: N/A;   PORTACATH PLACEMENT Left 11/15/2021   Procedure: INSERTION PORT-A-CATH;  Surgeon: Aviva Signs, MD;  Location: [facility];  Service: General;  Laterality: Left;   TONSILLECTOMY AND ADENOIDECTOMY      SOCIAL HISTORY:  Social History   Socioeconomic History   Marital status: Married    Spouse name: Not on file   Number of children: Not on file   Years of education: Not on file   Highest education level: Not on file  Occupational History   Occupation: Unemployed  Tobacco Use   Smoking status: Never   Smokeless tobacco: Never  Vaping Use   Vaping Use: Never used  Substance and Sexual Activity   Alcohol use: No   Drug use:  No   Sexual activity: Not Currently  Other Topics Concern   Not on file  Social History Narrative   Not on file   Social Determinants of Health   Financial Resource Strain: Not on file  Food Insecurity: No Food Insecurity (03/06/2022)   Hunger Vital Sign    Worried About Running Out of Food in the Last Year: Never true    Ran Out of Food in the Last Year: Never true  Transportation Needs: No Transportation Needs (03/06/2022)   PRAPARE - Hydrologist (Medical): No    Lack of Transportation  (Non-Medical): No  Physical Activity: Not on file  Stress: Not on file  Social Connections: Not on file  Intimate Partner Violence: Not At Risk (03/06/2022)   Humiliation, Afraid, Rape, and Kick questionnaire    Fear of Current or Ex-Partner: No    Emotionally Abused: No    Physically Abused: No    Sexually Abused: No    FAMILY HISTORY:  Family History  Problem Relation Age of Onset   Hypertension Mother    Bladder Cancer Maternal Grandmother    Breast cancer Maternal Great-grandmother    Colon cancer Neg Hx    Ovarian cancer Neg Hx    Endometrial cancer Neg Hx    Pancreatic cancer Neg Hx    Prostate cancer Neg Hx     CURRENT MEDICATIONS:  Current Outpatient Medications  Medication Sig Dispense Refill   apixaban (ELIQUIS) 5 MG TABS tablet Take 2 tablets (10 mg total) by mouth 2 (two) times daily. Then on 03/14/22, take 1 tab (5 mg) two times daily. 74 tablet 1   atorvastatin (LIPITOR) 20 MG tablet Take 1 tablet (20 mg total) by mouth daily. (Patient taking differently: Take 20 mg by mouth at bedtime.) 90 tablet 1   DULoxetine (CYMBALTA) 30 MG capsule Take 1 capsule (30 mg total) by mouth daily. Take 1 capsule by mouth x 7 days, then increase to 2 capsules daily 90 capsule 3   magnesium oxide (MAG-OX) 400 (240 Mg) MG tablet TAKE 1 TABLET BY MOUTH TWICE A DAY 60 tablet 2   Multiple Vitamin (MULTIVITAMIN) capsule Take 1 capsule by mouth daily.     Potassium 99 MG TABS Take 99 mg by mouth at bedtime.     No current facility-administered medications for this visit.   Facility-Administered Medications Ordered in Other Visits  Medication Dose Route Frequency Provider Last Rate Last Admin   heparin lock flush 100 unit/mL  500 Units Intravenous Once Derek Jack, MD       sodium chloride flush (NS) 0.9 % injection 10 mL  10 mL Intravenous PRN Derek Jack, MD        ALLERGIES:  Allergies  Allergen Reactions   Augmentin [Amoxicillin-Pot Clavulanate] Diarrhea and  Nausea And Vomiting   Doxycycline     Abdominal cramps.   Topamax [Topiramate] Nausea And Vomiting    PHYSICAL EXAM:  Performance status (ECOG): 1 - Symptomatic but completely ambulatory  Vitals:   05/31/22 1513  BP: 111/76  Pulse: 82  Resp: 17  Temp: 99 F (37.2 C)  SpO2: 100%    Wt Readings from Last 3 Encounters:  05/31/22 185 lb 6.4 oz (84.1 kg)  04/16/22 191 lb (86.6 kg)  04/02/22 185 lb 6.4 oz (84.1 kg)   Physical Exam Vitals reviewed.  Constitutional:      Appearance: Normal appearance. She is obese.     Comments: In wheelchair  Cardiovascular:  Rate and Rhythm: Normal rate and regular rhythm.     Pulses: Normal pulses.     Heart sounds: Normal heart sounds.  Pulmonary:     Effort: Pulmonary effort is normal.     Breath sounds: Normal breath sounds.  Abdominal:     Palpations: Abdomen is soft. There is no hepatomegaly, splenomegaly or mass.     Tenderness: There is no abdominal tenderness.  Neurological:     General: No focal deficit present.     Mental Status: She is alert and oriented to person, place, and time.  Psychiatric:        Mood and Affect: Mood normal.        Behavior: Behavior normal.     LABORATORY DATA:  I have reviewed the labs as listed.     Latest Ref Rng & Units 05/24/2022   12:17 PM 04/16/2022    9:25 AM 03/19/2022   10:31 AM  CBC  WBC 4.0 - 10.5 K/uL 5.5  5.0  4.3   Hemoglobin 12.0 - 15.0 g/dL 12.5  11.3  10.4   Hematocrit 36.0 - 46.0 % 37.2  34.1  31.7   Platelets 150 - 400 K/uL 250  244  196       Latest Ref Rng & Units 05/24/2022   12:17 PM 04/16/2022    9:25 AM 03/19/2022   10:31 AM  CMP  Glucose 70 - 99 mg/dL 101  119  122   BUN 6 - 20 mg/dL _0 Creatinine 0.44 - 1.00 mg/dL 0.51  0.52  0.61   Sodium 135 - 145 mmol/L 138  139  140   Potassium 3.5 - 5.1 mmol/L 3.8  3.7  3.2   Chloride 98 - 111 mmol/L 107  110  106   CO2 22 - 32 mmol/L _1 Calcium 8.9 - 10.3 mg/dL 8.9  8.6  8.2   Total  Protein 6.5 - 8.1 g/dL 6.7  7.0  6.2   Total Bilirubin 0.3 - 1.2 mg/dL 0.9  0.7  0.7   Alkaline Phos 38 - 126 U/L 56  49  61   AST 15 - 41 U/L _2 ALT 0 - 44 U/L _3 DIAGNOSTIC IMAGING:  I have independently reviewed the scans and discussed with the patient. CT Abdomen Pelvis W Contrast  Result Date: 05/28/2022 CLINICAL DATA:  Follow-up metastatic ovarian carcinoma. * Tracking Code: BO * EXAM: CT ABDOMEN AND PELVIS WITH CONTRAST TECHNIQUE: Multidetector CT imaging of the abdomen and pelvis was performed using the standard protocol following bolus administration of intravenous contrast. RADIATION DOSE REDUCTION: This exam was performed according to the departmental dose-optimization program which includes automated exposure control, adjustment of the mA and/or kV according to patient size and/or use of iterative reconstruction technique. CONTRAST:  119m OMNIPAQUE IOHEXOL 300 MG/ML  SOLN COMPARISON:  03/06/2022 FINDINGS: Lower Chest: No acute findings. Hepatobiliary: No hepatic masses identified. Multiple calcified gallstones again seen, however there is no evidence of cholecystitis or biliary ductal dilatation. Pancreas:  No mass or inflammatory changes. Spleen: Within normal limits in size and appearance. Adrenals/Urinary Tract: No suspicious masses identified. No evidence of ureteral calculi or hydronephrosis. Stomach/Bowel: New moderate small bowel dilatation is seen, with transition point involving a distal ileum in the central pelvis adjacent to the uterine fundus. Some peritoneal thickening is seen at this site, and the  etiology could be peritoneal carcinoma or adhesion. Vascular/Lymphatic: No pathologically enlarged lymph nodes. No acute vascular findings. Aortic atherosclerotic calcification incidentally noted. Stent again seen within the left common iliac vein. Reproductive:  Normal size uterus.  No adnexal mass identified. Other: Increased soft tissue stranding is seen  throughout the omental fat. Mild ascites in the anterior pelvis is also increased. These findings are consistent with increased peritoneal carcinomatosis. Musculoskeletal:  No suspicious bone lesions identified. IMPRESSION: Increased peritoneal carcinomatosis and mild ascites. New moderate distal small bowel obstruction, with transition point in the central pelvis adjacent to the uterine fundus, which may be due to peritoneal carcinomatosis or adhesion. Cholelithiasis. No radiographic evidence of cholecystitis. Aortic Atherosclerosis (ICD10-I70.0). Electronically Signed   By: Marlaine Hind M.D.   On: 05/28/2022 17:40     ASSESSMENT:  High-grade serous carcinoma of peritoneum: - She had developed sharp left-sided abdominal pain since Mother's Day, occasional radiating to the back.  40 pound weight loss in the last 6 months. - Personal history of cervical cancer in 2000, status post chemo plus XRT at [facility].  She has slightly decreased hearing in the left ear since then. - US abdomen (11/08/2021): Cholelithiasis with gallbladder wall thickening and sonographic Murphy sign.  Upper abdominal ascites. - Laparoscopy (11/13/2021): Starting of the omentum as well as the upper abdominal wall and diaphragm.  Liver was within normal limits.  6 L of ascites was evacuated.  Omental and peritoneal biopsies were sent.  Peritoneal implants throughout and a significant portion of omentum and on the small intestine. - CT CAP (11/13/2021): Ascites, extensive peritoneal thickening, nodularity and omental caking.  No evidence of primary malignancy in the chest, abdomen or pelvis.  Small left pleural effusion, nonspecific.  Cholelithiasis. - US pelvis (11/14/2021): Uterus is grossly unremarkable.  Ovaries not visualized. - CA125 (11/13/2021): 1269 - Pathology: Peritoneum and omental biopsy consistent with high-grade serous carcinoma.  HER2 2+ by IHC and negative by FISH. - 4 cycles of carboplatin and paclitaxel from  11/21/2021 through 01/23/2022    Social/family history: - Lives at home with her husband.  Worked in Scientist, research (medical) at [employer].  Non-smoker. - Maternal grandmother with bladder cancer.  Maternal great grandmother with breast cancer.   PLAN:  High-grade serous carcinoma of peritoneum: - Debulking surgery was delayed until 12 weeks after diagnosis of pulmonary embolism. - She reported episodes of epigastric abdominal pain followed by distention followed by vomiting lasting over 1 to 2 days.  She had 6 such episodes in the last 6 weeks.  These episodes resemble the symptoms she had when she was initially diagnosed. - Reviewed Ca1 25 which was 20.4.  Previous value was 13.4. - Reviewed CTAP (05/24/2022): Increased peritoneal carcinomatosis and mild ascites.  New moderate distal small bowel obstruction with transition point in the central pelvis adjacent to the uterine fundus which may be due to peritoneal carcinomatosis or adhesion. - I will reach out to Dr. Berline Lopes for further plan.  She might require 2-3 more cycles of chemotherapy prior to surgery.   2.  Peripheral neuropathy: - She has numbness in the feet.  Pins and needle sensation is gone. - Continue Cymbalta 60 mg daily.  3.  Weight loss: - She has eaten better and gained weight.  Albumin is normal. - However because of recent abdominal pain episodes, she has decreased eating and lost 6 pounds since last visit on 04/16/2022.  4.  Hypomagnesemia: - Continue magnesium twice daily.  Magnesium is normal.  5.  Hypokalemia: -  Continue potassium 20 mg daily.  Potassium is normal.  6.  Subsegmental pulmonary embolism (Dx 03/06/2022): - Continue Eliquis twice daily.  No bleeding issues.   Orders placed this encounter:  No orders of the defined types were placed in this encounter.      Derek Jack, MD [place] [phone]

## 2022-05-31 NOTE — Progress Notes (Signed)
Per Dr Delton Coombes omit diphenhydramine IV from plan and substitute with Quzyttir (cetirizine) 10 mg IVP x 1 with each treatment.  Henreitta Leber, PharmD 05/31/22 @ [phone]

## 2022-05-31 NOTE — Patient Instructions (Signed)
[facility]  Discharge Instructions  Dr. Delton Coombes has reviewed your most recent CT scan and lab work. Your labs are stable/normal.  Dr. Delton Coombes is going to reach out to Dr. Berline Lopes regarding your CT scan which revealed an increase in the size of the cancer. You will most likely need additional chemotherapy treatments. We will tentatively schedule this. If it not needed, we will call you and let you know. Otherwise, proceed as scheduled.  Please call with any questions or concerns.  Thank you for choosing [facility] to provide your oncology and hematology care.   To afford each patient quality time with our provider, please arrive at least 15 minutes before your scheduled appointment time. You may need to reschedule your appointment if you arrive late (10 or more minutes). Arriving late affects you and other patients whose appointments are after yours.  Also, if you miss three or more appointments without notifying the office, you may be dismissed from the clinic at the provider's discretion.    Again, thank you for choosing [facility].  Our hope is that these requests will decrease the amount of time that you wait before being seen by our physicians.   If you have a lab appointment with the [facility] please come in thru the Main Entrance and check in at the main information desk.           _____________________________________________________________  Should you have questions after your visit to [facility], please contact our office at [phone] and follow the prompts.  Our office hours are 8:00 a.m. to 4:30 p.m. Monday - Thursday and 8:00 a.m. to 2:30 p.m. Friday.  Please note that voicemails left after 4:00 p.m. may not be returned until the following business day.  We are closed weekends and all major holidays.  You do have access to a nurse 24-7, just call the main number to the clinic  [phone] and do not press any options, hold on the line and a nurse will answer the phone.    For prescription refill requests, have your pharmacy contact our office and allow 72 hours.    Masks are optional in the cancer centers. If you would like for your care team to wear a mask while they are taking care of you, please let them know. You may have one support person who is at least 53 years old accompany you for your appointments.

## 2022-06-01 ENCOUNTER — Other Ambulatory Visit: Payer: Self-pay

## 2022-06-01 DIAGNOSIS — C569 Malignant neoplasm of unspecified ovary: Secondary | ICD-10-CM

## 2022-06-05 ENCOUNTER — Inpatient Hospital Stay: Payer: Commercial Managed Care - PPO | Attending: Gynecologic Oncology

## 2022-06-05 ENCOUNTER — Inpatient Hospital Stay: Payer: Commercial Managed Care - PPO

## 2022-06-05 VITALS — BP 101/61 | HR 105 | Temp 97.8°F | Resp 18 | Wt 188.8 lb

## 2022-06-05 VITALS — BP 133/70 | HR 94 | Temp 98.3°F | Resp 16

## 2022-06-05 DIAGNOSIS — C569 Malignant neoplasm of unspecified ovary: Secondary | ICD-10-CM

## 2022-06-05 DIAGNOSIS — Z452 Encounter for adjustment and management of vascular access device: Secondary | ICD-10-CM | POA: Insufficient documentation

## 2022-06-05 DIAGNOSIS — Z5111 Encounter for antineoplastic chemotherapy: Secondary | ICD-10-CM | POA: Diagnosis not present

## 2022-06-05 DIAGNOSIS — C482 Malignant neoplasm of peritoneum, unspecified: Secondary | ICD-10-CM | POA: Diagnosis present

## 2022-06-05 DIAGNOSIS — Z5189 Encounter for other specified aftercare: Secondary | ICD-10-CM | POA: Diagnosis not present

## 2022-06-05 DIAGNOSIS — Z95828 Presence of other vascular implants and grafts: Secondary | ICD-10-CM

## 2022-06-05 LAB — COMPREHENSIVE METABOLIC PANEL
ALT: 11 U/L (ref 0–44)
AST: 18 U/L (ref 15–41)
Albumin: 3.4 g/dL — ABNORMAL LOW (ref 3.5–5.0)
Alkaline Phosphatase: 59 U/L (ref 38–126)
Anion gap: 7 (ref 5–15)
BUN: 12 mg/dL (ref 6–20)
CO2: 25 mmol/L (ref 22–32)
Calcium: 8.8 mg/dL — ABNORMAL LOW (ref 8.9–10.3)
Chloride: 106 mmol/L (ref 98–111)
Creatinine, Ser: 0.49 mg/dL (ref 0.44–1.00)
GFR, Estimated: >60 mL/min (ref >60)
Glucose, Bld: 123 mg/dL — ABNORMAL HIGH (ref 70–99)
Potassium: 3.7 mmol/L (ref 3.5–5.1)
Sodium: 138 mmol/L (ref 135–145)
Total Bilirubin: 1 mg/dL (ref 0.3–1.2)
Total Protein: 6.6 g/dL (ref 6.5–8.1)

## 2022-06-05 LAB — CBC WITH DIFFERENTIAL/PLATELET
Abs Immature Granulocytes: 0.02 10*3/uL (ref 0.00–0.07)
Basophils Absolute: 0 10*3/uL (ref 0.0–0.1)
Basophils Relative: 0 %
Eosinophils Absolute: 0.1 10*3/uL (ref 0.0–0.5)
Eosinophils Relative: 2 %
HCT: 37 % (ref 36.0–46.0)
Hemoglobin: 12.5 g/dL (ref 12.0–15.0)
Immature Granulocytes: 0 %
Lymphocytes Relative: 20 %
Lymphs Abs: 1.3 10*3/uL (ref 0.7–4.0)
MCH: 33.3 pg (ref 26.0–34.0)
MCHC: 33.8 g/dL (ref 30.0–36.0)
MCV: 98.7 fL (ref 80.0–100.0)
Monocytes Absolute: 0.6 10*3/uL (ref 0.1–1.0)
Monocytes Relative: 9 %
Neutro Abs: 4.5 10*3/uL (ref 1.7–7.7)
Neutrophils Relative %: 69 %
Platelets: 265 10*3/uL (ref 150–400)
RBC: 3.75 MIL/uL — ABNORMAL LOW (ref 3.87–5.11)
RDW: 11.5 % (ref 11.5–15.5)
WBC: 6.6 10*3/uL (ref 4.0–10.5)
nRBC: 0 % (ref 0.0–0.2)

## 2022-06-05 LAB — MAGNESIUM: Magnesium: 1.8 mg/dL (ref 1.7–2.4)

## 2022-06-05 MED ORDER — SODIUM CHLORIDE 0.9% FLUSH
10.0000 mL | Freq: Once | INTRAVENOUS | Status: AC
  Administered 2022-06-05: 10 mL via INTRAVENOUS

## 2022-06-05 MED ORDER — SODIUM CHLORIDE 0.9 % IV SOLN
150.0000 mg | Freq: Once | INTRAVENOUS | Status: AC
  Administered 2022-06-05: 150 mg via INTRAVENOUS
  Filled 2022-06-05: qty 150

## 2022-06-05 MED ORDER — PALONOSETRON HCL INJECTION 0.25 MG/5ML
0.2500 mg | Freq: Once | INTRAVENOUS | Status: AC
  Administered 2022-06-05: 0.25 mg via INTRAVENOUS
  Filled 2022-06-05: qty 5

## 2022-06-05 MED ORDER — SODIUM CHLORIDE 0.9 % IV SOLN: Freq: Once | INTRAVENOUS | Status: AC

## 2022-06-05 MED ORDER — SODIUM CHLORIDE 0.9% FLUSH
10.0000 mL | INTRAVENOUS | Status: DC | PRN
  Administered 2022-06-05: 10 mL

## 2022-06-05 MED ORDER — HEPARIN SOD (PORK) LOCK FLUSH 100 UNIT/ML IV SOLN
500.0000 [IU] | Freq: Once | INTRAVENOUS | Status: AC | PRN
  Administered 2022-06-05: 500 [IU]

## 2022-06-05 MED ORDER — SODIUM CHLORIDE 0.9 % IV SOLN
140.0000 mg/m2 | Freq: Once | INTRAVENOUS | Status: AC
  Administered 2022-06-05: 276 mg via INTRAVENOUS
  Filled 2022-06-05: qty 46

## 2022-06-05 MED ORDER — CETIRIZINE HCL 10 MG/ML IV SOLN
10.0000 mg | Freq: Once | INTRAVENOUS | Status: AC
  Administered 2022-06-05: 10 mg via INTRAVENOUS
  Filled 2022-06-05: qty 1

## 2022-06-05 MED ORDER — FAMOTIDINE IN NACL 20-0.9 MG/50ML-% IV SOLN
20.0000 mg | Freq: Once | INTRAVENOUS | Status: AC
  Administered 2022-06-05: 20 mg via INTRAVENOUS
  Filled 2022-06-05: qty 50

## 2022-06-05 MED ORDER — SODIUM CHLORIDE 0.9 % IV SOLN
720.0000 mg | Freq: Once | INTRAVENOUS | Status: AC
  Administered 2022-06-05: 720 mg via INTRAVENOUS
  Filled 2022-06-05: qty 72

## 2022-06-05 MED ORDER — SODIUM CHLORIDE 0.9 % IV SOLN
10.0000 mg | Freq: Once | INTRAVENOUS | Status: AC
  Administered 2022-06-05: 10 mg via INTRAVENOUS
  Filled 2022-06-05: qty 10

## 2022-06-05 NOTE — Progress Notes (Signed)
Pt presents today for Taxol and Carboplatin per provider's order. Vital signs and labs WNL for treatment today. Okay to proceed with treatment today.  Taxol and Carboplatin given today per MD orders. Tolerated infusion without adverse affects. Vital signs stable. No complaints at this time. Discharged from clinic ambulatory in stable condition. Alert and oriented x 3. F/U with [facility] as scheduled.

## 2022-06-05 NOTE — Patient Instructions (Signed)
[facility]  Discharge Instructions: Thank you for choosing [facility] to provide your oncology and hematology care.  If you have a lab appointment with the [facility], please come in thru the Main Entrance and check in at the main information desk.  Wear comfortable clothing and clothing appropriate for easy access to any Portacath or PICC line.   We strive to give you quality time with your provider. You may need to reschedule your appointment if you arrive late (15 or more minutes).  Arriving late affects you and other patients whose appointments are after yours.  Also, if you miss three or more appointments without notifying the office, you may be dismissed from the clinic at the provider's discretion.      For prescription refill requests, have your pharmacy contact our office and allow 72 hours for refills to be completed.    Today you received the following chemotherapy and/or immunotherapy agents Taxol and Carboplatin   To help prevent nausea and vomiting after your treatment, we encourage you to take your nausea medication as directed.  Paclitaxel Injection What is this medication? PACLITAXEL (PAK li TAX el) treats some types of cancer. It works by slowing down the growth of cancer cells. This medicine may be used for other purposes; ask your health care provider or pharmacist if you have questions. COMMON BRAND NAME(S): Onxol, Taxol What should I tell my care team before I take this medication? They need to know if you have any of these conditions: Heart disease Liver disease Low white blood cell levels An unusual or allergic reaction to paclitaxel, other medications, foods, dyes, or preservatives If you or your partner are pregnant or trying to get pregnant Breast-feeding How should I use this medication? This medication is injected into a vein. It is given by your care team in a hospital or clinic setting. Talk to your care team about  the use of this medication in children. While it may be given to children for selected conditions, precautions do apply. Overdosage: If you think you have taken too much of this medicine contact a poison control center or emergency room at once. NOTE: This medicine is only for you. Do not share this medicine with others. What if I miss a dose? Keep appointments for follow-up doses. It is important not to miss your dose. Call your care team if you are unable to keep an appointment. What may interact with this medication? Do not take this medication with any of the following: Live virus vaccines Other medications may affect the way this medication works. Talk with your care team about all of the medications you take. They may suggest changes to your treatment plan to lower the risk of side effects and to make sure your medications work as intended. This list may not describe all possible interactions. Give your health care provider a list of all the medicines, herbs, non-prescription drugs, or dietary supplements you use. Also tell them if you smoke, drink alcohol, or use illegal drugs. Some items may interact with your medicine. What should I watch for while using this medication? Your condition will be monitored carefully while you are receiving this medication. You may need blood work while taking this medication. This medication may make you feel generally unwell. This is not uncommon as chemotherapy can affect healthy cells as well as cancer cells. Report any side effects. Continue your course of treatment even though you feel ill unless your care team tells  you to stop. This medication can cause serious allergic reactions. To reduce the risk, your care team may give you other medications to take before receiving this one. Be sure to follow the directions from your care team. This medication may increase your risk of getting an infection. Call your care team for advice if you get a fever, chills,  sore throat, or other symptoms of a cold or flu. Do not treat yourself. Try to avoid being around people who are sick. This medication may increase your risk to bruise or bleed. Call your care team if you notice any unusual bleeding. Be careful brushing or flossing your teeth or using a toothpick because you may get an infection or bleed more easily. If you have any dental work done, tell your dentist you are receiving this medication. Talk to your care team if you may be pregnant. Serious birth defects can occur if you take this medication during pregnancy. Talk to your care team before breastfeeding. Changes to your treatment plan may be needed. What side effects may I notice from receiving this medication? Side effects that you should report to your care team as soon as possible: Allergic reactions--skin rash, itching, hives, swelling of the face, lips, tongue, or throat Heart rhythm changes--fast or irregular heartbeat, dizziness, feeling faint or lightheaded, chest pain, trouble breathing Increase in blood pressure Infection--fever, chills, cough, sore throat, wounds that don't heal, pain or trouble when passing urine, general feeling of discomfort or being unwell Low blood pressure--dizziness, feeling faint or lightheaded, blurry vision Low red blood cell level--unusual weakness or fatigue, dizziness, headache, trouble breathing Painful swelling, warmth, or redness of the skin, blisters or sores at the infusion site Pain, tingling, or numbness in the hands or feet Slow heartbeat--dizziness, feeling faint or lightheaded, confusion, trouble breathing, unusual weakness or fatigue Unusual bruising or bleeding Side effects that usually do not require medical attention (report to your care team if they continue or are bothersome): Diarrhea Hair loss Joint pain Loss of appetite Muscle pain Nausea Vomiting This list may not describe all possible side effects. Call your doctor for medical  advice about side effects. You may report side effects to FDA at 1-800-FDA-1088. Where should I keep my medication? This medication is given in a hospital or clinic. It will not be stored at home. NOTE: This sheet is a summary. It may not cover all possible information. If you have questions about this medicine, talk to your doctor, pharmacist, or health care provider.  2023 Elsevier/Gold Standard (2021-09-20 00:00:00)  Carboplatin Injection What is this medication? CARBOPLATIN (KAR boe pla tin) treats some types of cancer. It works by slowing down the growth of cancer cells. This medicine may be used for other purposes; ask your health care provider or pharmacist if you have questions. COMMON BRAND NAME(S): Paraplatin What should I tell my care team before I take this medication? They need to know if you have any of these conditions: Blood disorders Hearing problems Kidney disease Recent or ongoing radiation therapy An unusual or allergic reaction to carboplatin, cisplatin, other medications, foods, dyes, or preservatives Pregnant or trying to get pregnant Breast-feeding How should I use this medication? This medication is injected into a vein. It is given by your care team in a hospital or clinic setting. Talk to your care team about the use of this medication in children. Special care may be needed. Overdosage: If you think you have taken too much of this medicine contact a poison control center  or emergency room at once. NOTE: This medicine is only for you. Do not share this medicine with others. What if I miss a dose? Keep appointments for follow-up doses. It is important not to miss your dose. Call your care team if you are unable to keep an appointment. What may interact with this medication? Medications for seizures Some antibiotics, such as amikacin, gentamicin, neomycin, streptomycin, tobramycin Vaccines This list may not describe all possible interactions. Give your health  care provider a list of all the medicines, herbs, non-prescription drugs, or dietary supplements you use. Also tell them if you smoke, drink alcohol, or use illegal drugs. Some items may interact with your medicine. What should I watch for while using this medication? Your condition will be monitored carefully while you are receiving this medication. You may need blood work while taking this medication. This medication may make you feel generally unwell. This is not uncommon, as chemotherapy can affect healthy cells as well as cancer cells. Report any side effects. Continue your course of treatment even though you feel ill unless your care team tells you to stop. In some cases, you may be given additional medications to help with side effects. Follow all directions for their use. This medication may increase your risk of getting an infection. Call your care team for advice if you get a fever, chills, sore throat, or other symptoms of a cold or flu. Do not treat yourself. Try to avoid being around people who are sick. Avoid taking medications that contain aspirin, acetaminophen, ibuprofen, naproxen, or ketoprofen unless instructed by your care team. These medications may hide a fever. Be careful brushing or flossing your teeth or using a toothpick because you may get an infection or bleed more easily. If you have any dental work done, tell your dentist you are receiving this medication. Talk to your care team if you wish to become pregnant or think you might be pregnant. This medication can cause serious birth defects. Talk to your care team about effective forms of contraception. Do not breast-feed while taking this medication. What side effects may I notice from receiving this medication? Side effects that you should report to your care team as soon as possible: Allergic reactions--skin rash, itching, hives, swelling of the face, lips, tongue, or throat Infection--fever, chills, cough, sore throat,  wounds that don't heal, pain or trouble when passing urine, general feeling of discomfort or being unwell Low red blood cell level--unusual weakness or fatigue, dizziness, headache, trouble breathing Pain, tingling, or numbness in the hands or feet, muscle weakness, change in vision, confusion or trouble speaking, loss of balance or coordination, trouble walking, seizures Unusual bruising or bleeding Side effects that usually do not require medical attention (report to your care team if they continue or are bothersome): Hair loss Nausea Unusual weakness or fatigue Vomiting This list may not describe all possible side effects. Call your doctor for medical advice about side effects. You may report side effects to FDA at 1-800-FDA-1088. Where should I keep my medication? This medication is given in a hospital or clinic. It will not be stored at home. NOTE: This sheet is a summary. It may not cover all possible information. If you have questions about this medicine, talk to your doctor, pharmacist, or health care provider.  2023 Elsevier/Gold Standard (2021-09-04 00:00:00)    BELOW ARE SYMPTOMS THAT SHOULD BE REPORTED IMMEDIATELY: *FEVER GREATER THAN 100.4 F (38 C) OR HIGHER *CHILLS OR SWEATING *NAUSEA AND VOMITING THAT IS NOT CONTROLLED  WITH YOUR NAUSEA MEDICATION *UNUSUAL SHORTNESS OF BREATH *UNUSUAL BRUISING OR BLEEDING *URINARY PROBLEMS (pain or burning when urinating, or frequent urination) *BOWEL PROBLEMS (unusual diarrhea, constipation, pain near the anus) TENDERNESS IN MOUTH AND THROAT WITH OR WITHOUT PRESENCE OF ULCERS (sore throat, sores in mouth, or a toothache) UNUSUAL RASH, SWELLING OR PAIN  UNUSUAL VAGINAL DISCHARGE OR ITCHING   Items with * indicate a potential emergency and should be followed up as soon as possible or go to the Emergency Department if any problems should occur.  Please show the CHEMOTHERAPY ALERT CARD or IMMUNOTHERAPY ALERT CARD at check-in to the  Emergency Department and triage nurse.  Should you have questions after your visit or need to cancel or reschedule your appointment, please contact [facility] [phone]  and follow the prompts.  Office hours are 8:00 a.m. to 4:30 p.m. Monday - Friday. Please note that voicemails left after 4:00 p.m. may not be returned until the following business day.  We are closed weekends and major holidays. You have access to a nurse at all times for urgent questions. Please call the main number to the clinic [phone] and follow the prompts.  For any non-urgent questions, you may also contact your provider using MyChart. We now offer e-Visits for anyone 32 and older to request care online for non-urgent symptoms. For details visit [email or web address].   Also download the MyChart app! Go to the app store, search "MyChart", open the app, select [facility], and log in with your MyChart username and password.

## 2022-06-07 ENCOUNTER — Inpatient Hospital Stay: Payer: Commercial Managed Care - PPO

## 2022-06-07 VITALS — BP 144/83 | HR 98 | Temp 97.5°F | Resp 18

## 2022-06-07 DIAGNOSIS — Z5111 Encounter for antineoplastic chemotherapy: Secondary | ICD-10-CM | POA: Diagnosis not present

## 2022-06-07 DIAGNOSIS — C482 Malignant neoplasm of peritoneum, unspecified: Secondary | ICD-10-CM

## 2022-06-07 MED ORDER — PEGFILGRASTIM INJECTION 6 MG/0.6ML ~~LOC~~
6.0000 mg | PREFILLED_SYRINGE | Freq: Once | SUBCUTANEOUS | Status: AC
  Administered 2022-06-07: 6 mg via SUBCUTANEOUS
  Filled 2022-06-07: qty 0.6

## 2022-06-07 NOTE — Progress Notes (Signed)
Nicole Santos presents today for injection per the provider's orders.  Neulasta administration without incident; injection site WNL; see MAR for injection details.  Patient tolerated procedure well and without incident.  No questions or complaints noted at this time. Vital signs stable. No complaints at this time. Discharged from clinic ambulatory in stable condition. Alert and oriented x 3. F/U with [facility] as scheduled.

## 2022-06-07 NOTE — Patient Instructions (Signed)
[facility]  Discharge Instructions: Thank you for choosing [facility] to provide your oncology and hematology care.  If you have a lab appointment with the Cancer Center, please come in thru the Main Entrance and check in at the main information desk.  Wear comfortable clothing and clothing appropriate for easy access to any Portacath or PICC line.   We strive to give you quality time with your provider. You may need to reschedule your appointment if you arrive late (15 or more minutes).  Arriving late affects you and other patients whose appointments are after yours.  Also, if you miss three or more appointments without notifying the office, you may be dismissed from the clinic at the provider's discretion.      For prescription refill requests, have your pharmacy contact our office and allow 72 hours for refills to be completed.    Today you received the following chemotherapy and/or immunotherapy agents Neulasta injection. Pegfilgrastim Injection What is this medication? PEGFILGRASTIM (PEG fil gra stim) lowers the risk of infection in people who are receiving chemotherapy. It works by helping your body make more white blood cells, which protects your body from infection. It may also be used to help people who have been exposed to high doses of radiation. This medicine may be used for other purposes; ask your health care provider or pharmacist if you have questions. COMMON BRAND NAME(S): Fulphila, Fylnetra, Neulasta, Nyvepria, Stimufend, UDENYCA, Ziextenzo What should I tell my care team before I take this medication? They need to know if you have any of these conditions: Kidney disease Latex allergy Ongoing radiation therapy Sickle cell disease Skin reactions to acrylic adhesives (On-Body Injector only) An unusual or allergic reaction to pegfilgrastim, filgrastim, other medications, foods, dyes, or preservatives Pregnant or trying to get  pregnant Breast-feeding How should I use this medication? This medication is for injection under the skin. If you get this medication at home, you will be taught how to prepare and give the pre-filled syringe or how to use the On-body Injector. Refer to the patient Instructions for Use for detailed instructions. Use exactly as directed. Tell your care team immediately if you suspect that the On-body Injector may not have performed as intended or if you suspect the use of the On-body Injector resulted in a missed or partial dose. It is important that you put your used needles and syringes in a special sharps container. Do not put them in a trash can. If you do not have a sharps container, call your pharmacist or care team to get one. Talk to your care team about the use of this medication in children. While this medication may be prescribed for selected conditions, precautions do apply. Overdosage: If you think you have taken too much of this medicine contact a poison control center or emergency room at once. NOTE: This medicine is only for you. Do not share this medicine with others. What if I miss a dose? It is important not to miss your dose. Call your care team if you miss your dose. If you miss a dose due to an On-body Injector failure or leakage, a new dose should be administered as soon as possible using a single prefilled syringe for manual use. What may interact with this medication? Interactions have not been studied. This list may not describe all possible interactions. Give your health care provider a list of all the medicines, herbs, non-prescription drugs, or dietary supplements you use. Also tell   them if you smoke, drink alcohol, or use illegal drugs. Some items may interact with your medicine. What should I watch for while using this medication? Your condition will be monitored carefully while you are receiving this medication. You may need blood work done while you are taking this  medication. Talk to your care team about your risk of cancer. You may be more at risk for certain types of cancer if you take this medication. If you are going to need a MRI, CT scan, or other procedure, tell your care team that you are using this medication (On-Body Injector only). What side effects may I notice from receiving this medication? Side effects that you should report to your care team as soon as possible: Allergic reactions--skin rash, itching, hives, swelling of the face, lips, tongue, or throat Capillary leak syndrome--stomach or muscle pain, unusual weakness or fatigue, feeling faint or lightheaded, decrease in the amount of urine, swelling of the ankles, hands, or feet, trouble breathing High white blood cell level--fever, fatigue, trouble breathing, night sweats, change in vision, weight loss Inflammation of the aorta--fever, fatigue, back, chest, or stomach pain, severe headache Kidney injury (glomerulonephritis)--decrease in the amount of urine, red or dark brown urine, foamy or bubbly urine, swelling of the ankles, hands, or feet Shortness of breath or trouble breathing Spleen injury--pain in upper left stomach or shoulder Unusual bruising or bleeding Side effects that usually do not require medical attention (report to your care team if they continue or are bothersome): Bone pain Pain in the hands or feet This list may not describe all possible side effects. Call your doctor for medical advice about side effects. You may report side effects to FDA at 1-800-FDA-1088. Where should I keep my medication? Keep out of the reach of children. If you are using this medication at home, you will be instructed on how to store it. Throw away any unused medication after the expiration date on the label. NOTE: This sheet is a summary. It may not cover all possible information. If you have questions about this medicine, talk to your doctor, pharmacist, or health care provider.  2023  Elsevier/Gold Standard (2020-12-08 00:00:00)       To help prevent nausea and vomiting after your treatment, we encourage you to take your nausea medication as directed.  BELOW ARE SYMPTOMS THAT SHOULD BE REPORTED IMMEDIATELY: *FEVER GREATER THAN 100.4 F (38 C) OR HIGHER *CHILLS OR SWEATING *NAUSEA AND VOMITING THAT IS NOT CONTROLLED WITH YOUR NAUSEA MEDICATION *UNUSUAL SHORTNESS OF BREATH *UNUSUAL BRUISING OR BLEEDING *URINARY PROBLEMS (pain or burning when urinating, or frequent urination) *BOWEL PROBLEMS (unusual diarrhea, constipation, pain near the anus) TENDERNESS IN MOUTH AND THROAT WITH OR WITHOUT PRESENCE OF ULCERS (sore throat, sores in mouth, or a toothache) UNUSUAL RASH, SWELLING OR PAIN  UNUSUAL VAGINAL DISCHARGE OR ITCHING   Items with * indicate a potential emergency and should be followed up as soon as possible or go to the Emergency Department if any problems should occur.  Please show the CHEMOTHERAPY ALERT CARD or IMMUNOTHERAPY ALERT CARD at check-in to the Emergency Department and triage nurse.  Should you have questions after your visit or need to cancel or reschedule your appointment, please contact [facility] [phone]  and follow the prompts.  Office hours are 8:00 a.m. to 4:30 p.m. Monday - Friday. Please note that voicemails left after 4:00 p.m. may not be returned until the following business day.  We are closed weekends and major holidays.   You have access to a nurse at all times for urgent questions. Please call the main number to the clinic [phone] and follow the prompts.  For any non-urgent questions, you may also contact your provider using MyChart. We now offer e-Visits for anyone 18 and older to request care online for non-urgent symptoms. For details visit [email or web address].   Also download the MyChart app! Go to the app store, search "MyChart", open the app, select [facility], and log in with your MyChart username and  password.   

## 2022-06-21 ENCOUNTER — Other Ambulatory Visit: Payer: Self-pay

## 2022-06-21 DIAGNOSIS — C569 Malignant neoplasm of unspecified ovary: Secondary | ICD-10-CM

## 2022-06-26 ENCOUNTER — Encounter: Payer: Self-pay | Admitting: Hematology

## 2022-06-26 ENCOUNTER — Inpatient Hospital Stay: Payer: Commercial Managed Care - PPO

## 2022-06-26 ENCOUNTER — Other Ambulatory Visit: Payer: Self-pay | Admitting: *Deleted

## 2022-06-26 ENCOUNTER — Inpatient Hospital Stay: Payer: Commercial Managed Care - PPO | Admitting: Hematology

## 2022-06-26 VITALS — BP 135/61 | HR 90 | Temp 97.1°F | Resp 18 | Wt 189.9 lb

## 2022-06-26 VITALS — BP 135/66 | HR 92 | Temp 98.1°F | Resp 18

## 2022-06-26 DIAGNOSIS — C482 Malignant neoplasm of peritoneum, unspecified: Secondary | ICD-10-CM | POA: Diagnosis not present

## 2022-06-26 DIAGNOSIS — Z95828 Presence of other vascular implants and grafts: Secondary | ICD-10-CM

## 2022-06-26 DIAGNOSIS — Z5111 Encounter for antineoplastic chemotherapy: Secondary | ICD-10-CM | POA: Diagnosis not present

## 2022-06-26 DIAGNOSIS — C569 Malignant neoplasm of unspecified ovary: Secondary | ICD-10-CM

## 2022-06-26 LAB — CBC WITH DIFFERENTIAL/PLATELET
Abs Immature Granulocytes: 0.04 10*3/uL (ref 0.00–0.07)
Basophils Absolute: 0 10*3/uL (ref 0.0–0.1)
Basophils Relative: 1 %
Eosinophils Absolute: 0.1 10*3/uL (ref 0.0–0.5)
Eosinophils Relative: 1 %
HCT: 32.1 % — ABNORMAL LOW (ref 36.0–46.0)
Hemoglobin: 10.8 g/dL — ABNORMAL LOW (ref 12.0–15.0)
Immature Granulocytes: 1 %
Lymphocytes Relative: 26 %
Lymphs Abs: 1.1 10*3/uL (ref 0.7–4.0)
MCH: 32.9 pg (ref 26.0–34.0)
MCHC: 33.6 g/dL (ref 30.0–36.0)
MCV: 97.9 fL (ref 80.0–100.0)
Monocytes Absolute: 0.5 10*3/uL (ref 0.1–1.0)
Monocytes Relative: 11 %
Neutro Abs: 2.7 10*3/uL (ref 1.7–7.7)
Neutrophils Relative %: 60 %
Platelets: 198 10*3/uL (ref 150–400)
RBC: 3.28 MIL/uL — ABNORMAL LOW (ref 3.87–5.11)
RDW: 12.6 % (ref 11.5–15.5)
WBC: 4.5 10*3/uL (ref 4.0–10.5)
nRBC: 0 % (ref 0.0–0.2)

## 2022-06-26 LAB — COMPREHENSIVE METABOLIC PANEL
ALT: 16 U/L (ref 0–44)
AST: 17 U/L (ref 15–41)
Albumin: 3.1 g/dL — ABNORMAL LOW (ref 3.5–5.0)
Alkaline Phosphatase: 62 U/L (ref 38–126)
Anion gap: 7 (ref 5–15)
BUN: 14 mg/dL (ref 6–20)
CO2: 26 mmol/L (ref 22–32)
Calcium: 8.5 mg/dL — ABNORMAL LOW (ref 8.9–10.3)
Chloride: 104 mmol/L (ref 98–111)
Creatinine, Ser: 0.59 mg/dL (ref 0.44–1.00)
GFR, Estimated: >60 mL/min (ref >60)
Glucose, Bld: 111 mg/dL — ABNORMAL HIGH (ref 70–99)
Potassium: 3.5 mmol/L (ref 3.5–5.1)
Sodium: 137 mmol/L (ref 135–145)
Total Bilirubin: 0.6 mg/dL (ref 0.3–1.2)
Total Protein: 6.3 g/dL — ABNORMAL LOW (ref 6.5–8.1)

## 2022-06-26 LAB — MAGNESIUM: Magnesium: 1.6 mg/dL — ABNORMAL LOW (ref 1.7–2.4)

## 2022-06-26 MED ORDER — HEPARIN SOD (PORK) LOCK FLUSH 100 UNIT/ML IV SOLN
500.0000 [IU] | Freq: Once | INTRAVENOUS | Status: AC | PRN
  Administered 2022-06-26: 500 [IU]

## 2022-06-26 MED ORDER — MAGNESIUM SULFATE 2 GM/50ML IV SOLN
2.0000 g | Freq: Once | INTRAVENOUS | Status: AC
  Administered 2022-06-26: 2 g via INTRAVENOUS
  Filled 2022-06-26: qty 50

## 2022-06-26 MED ORDER — SODIUM CHLORIDE 0.9 % IV SOLN
720.0000 mg | Freq: Once | INTRAVENOUS | Status: AC
  Administered 2022-06-26: 700 mg via INTRAVENOUS
  Filled 2022-06-26: qty 70

## 2022-06-26 MED ORDER — SODIUM CHLORIDE 0.9% FLUSH
10.0000 mL | INTRAVENOUS | Status: DC | PRN
  Administered 2022-06-26: 10 mL via INTRAVENOUS

## 2022-06-26 MED ORDER — SODIUM CHLORIDE 0.9 % IV SOLN
10.0000 mg | Freq: Once | INTRAVENOUS | Status: AC
  Administered 2022-06-26: 10 mg via INTRAVENOUS
  Filled 2022-06-26: qty 10

## 2022-06-26 MED ORDER — FAMOTIDINE IN NACL 20-0.9 MG/50ML-% IV SOLN
20.0000 mg | Freq: Once | INTRAVENOUS | Status: AC
  Administered 2022-06-26: 20 mg via INTRAVENOUS
  Filled 2022-06-26: qty 50

## 2022-06-26 MED ORDER — CETIRIZINE HCL 10 MG/ML IV SOLN
10.0000 mg | Freq: Once | INTRAVENOUS | Status: AC
  Administered 2022-06-26: 10 mg via INTRAVENOUS
  Filled 2022-06-26: qty 1

## 2022-06-26 MED ORDER — SODIUM CHLORIDE 0.9 % IV SOLN
150.0000 mg | Freq: Once | INTRAVENOUS | Status: AC
  Administered 2022-06-26: 150 mg via INTRAVENOUS
  Filled 2022-06-26: qty 150

## 2022-06-26 MED ORDER — ALTEPLASE 2 MG IJ SOLR
2.0000 mg | Freq: Once | INTRAMUSCULAR | Status: AC | PRN
  Administered 2022-06-26: 2 mg
  Filled 2022-06-26: qty 2

## 2022-06-26 MED ORDER — SODIUM CHLORIDE 0.9% FLUSH
10.0000 mL | INTRAVENOUS | Status: DC | PRN
  Administered 2022-06-26: 10 mL

## 2022-06-26 MED ORDER — DIPHENOXYLATE-ATROPINE 2.5-0.025 MG PO TABS: ORAL_TABLET | ORAL | 3 refills | Status: DC

## 2022-06-26 MED ORDER — PALONOSETRON HCL INJECTION 0.25 MG/5ML
0.2500 mg | Freq: Once | INTRAVENOUS | Status: AC
  Administered 2022-06-26: 0.25 mg via INTRAVENOUS
  Filled 2022-06-26: qty 5

## 2022-06-26 MED ORDER — SODIUM CHLORIDE 0.9 % IV SOLN
140.0000 mg/m2 | Freq: Once | INTRAVENOUS | Status: AC
  Administered 2022-06-26: 276 mg via INTRAVENOUS
  Filled 2022-06-26: qty 46

## 2022-06-26 MED ORDER — SODIUM CHLORIDE 0.9 % IV SOLN: Freq: Once | INTRAVENOUS | Status: AC

## 2022-06-26 NOTE — Progress Notes (Signed)
Pt presents today for Paclitaxel and Carboplatin per provider's order. Vital sign and other labs WNL for treatment. Pt's magnesium is 1.6 today. Pt will receive 2g IV magnesium sulfate per Dr.K. Faythe Ghee to proceed with treatment today per Dr.K.  Paclitaxel, Carboplatin, and 2g IV magnesium sulfate given today per MD orders. Tolerated infusion without adverse affects. Vital signs stable. No complaints at this time. Discharged from clinic ambulatory in stable condition. Alert and oriented x 3. F/U with [facility] as scheduled.

## 2022-06-26 NOTE — Progress Notes (Signed)
Nicole Santos, [employer] [id number]   CLINIC:  Medical Oncology/Hematology  PCP:  Janora Norlander, DO [address] [place] [address] [phone]   REASON FOR VISIT:  Follow-up for high-grade serous carcinoma of peritoneum  PRIOR THERAPY: Carboplatin and paclitaxel x 4 cycles, completed on 01/23/2022  NGS Results: not done  CURRENT THERAPY: Chemotherapy  BRIEF ONCOLOGIC HISTORY:  Oncology History  Primary peritoneal carcinomatosis ([place])  11/17/2021 Initial Diagnosis   Primary peritoneal carcinomatosis ([place])   11/21/2021 - 01/25/2022 Chemotherapy   Patient is on Treatment Plan : OVARIAN Carboplatin (AUC 6) / Paclitaxel (175) q21d x 6 cycles     11/21/2021 -  Chemotherapy   Patient is on Treatment Plan : OVARIAN Carboplatin (AUC 6) + Paclitaxel (175) q21d X 6 Cycles       CANCER STAGING:  Cancer Staging  Primary peritoneal carcinomatosis ([place]) Staging form: Ovary, Fallopian Tube, and Primary Peritoneal Carcinoma, AJCC 8th Edition - Clinical stage from 11/17/2021: FIGO Stage IIIC (cT3c, cM0) - Unsigned   INTERVAL HISTORY:  [name], a 52 y.o. female, seen for follow-up of ovarian cancer.  She has tolerated chemotherapy 3 weeks ago very well.  Numbness in the extremities has been stable.  Reportedly had diarrhea for couple of weeks but did not take Lomotil aggressively.  REVIEW OF SYSTEMS:  Review of Systems  Constitutional:  Negative for appetite change, fatigue and unexpected weight change.  Genitourinary:  Negative for frequency.   Neurological:  Positive for numbness (In the fingertips and feet slightly worse since last visit).  Psychiatric/Behavioral:  Negative for sleep disturbance.   All other systems reviewed and are negative.   PAST MEDICAL/SURGICAL HISTORY:  Past Medical History:  Diagnosis Date   Acute respiratory disease due to COVID-19 virus 02/02/2019   Anxiety    Cancer ([place])    Depression    Dyspnea     Dysrhythmia    History of diabetes mellitus    Resolved in Sept 2023 with weight loss.   Hypertension    Ovarian cancer ([place]) 11/14/2021   Peritoneal carcinomatosis ([place])    Pneumonia    Sleep apnea    doesn't use CPAP   Tachycardia    Past Surgical History:  Procedure Laterality Date   KNEE SURGERY Right    Arthroscopic   LAPAROSCOPIC ABDOMINAL EXPLORATION N/A 11/13/2021   Procedure: Exploratory laparoscopy;  Surgeon: Aviva Signs, MD;  Location: [facility];  Service: General;  Laterality: N/A;   PORTACATH PLACEMENT Left 11/15/2021   Procedure: INSERTION PORT-A-CATH;  Surgeon: Aviva Signs, MD;  Location: [facility];  Service: General;  Laterality: Left;   TONSILLECTOMY AND ADENOIDECTOMY      SOCIAL HISTORY:  Social History   Socioeconomic History   Marital status: Married    Spouse name: Not on file   Number of children: Not on file   Years of education: Not on file   Highest education level: Not on file  Occupational History   Occupation: Unemployed  Tobacco Use   Smoking status: Never   Smokeless tobacco: Never  Vaping Use   Vaping Use: Never used  Substance and Sexual Activity   Alcohol use: No   Drug use: No   Sexual activity: Not Currently  Other Topics Concern   Not on file  Social History Narrative   Not on file   Social Determinants of Health   Financial Resource Strain: Not on file  Food Insecurity: No Food  Insecurity (03/06/2022)   Hunger Vital Sign    Worried About Running Out of Food in the Last Year: Never true    Ran Out of Food in the Last Year: Never true  Transportation Needs: No Transportation Needs (03/06/2022)   PRAPARE - Hydrologist (Medical): No    Lack of Transportation (Non-Medical): No  Physical Activity: Not on file  Stress: Not on file  Social Connections: Not on file  Intimate Partner Violence: Not At Risk (03/06/2022)   Humiliation, Afraid, Rape, and Kick questionnaire    Fear of Current or Ex-Partner: No     Emotionally Abused: No    Physically Abused: No    Sexually Abused: No    FAMILY HISTORY:  Family History  Problem Relation Age of Onset   Hypertension Mother    Bladder Cancer Maternal Grandmother    Breast cancer Maternal Great-grandmother    Colon cancer Neg Hx    Ovarian cancer Neg Hx    Endometrial cancer Neg Hx    Pancreatic cancer Neg Hx    Prostate cancer Neg Hx     CURRENT MEDICATIONS:  Current Outpatient Medications  Medication Sig Dispense Refill   apixaban (ELIQUIS) 5 MG TABS tablet Take 2 tablets (10 mg total) by mouth 2 (two) times daily. Then on 03/14/22, take 1 tab (5 mg) two times daily. 74 tablet 1   atorvastatin (LIPITOR) 20 MG tablet Take 1 tablet (20 mg total) by mouth daily. (Patient taking differently: Take 20 mg by mouth at bedtime.) 90 tablet 1   DULoxetine (CYMBALTA) 30 MG capsule Take 1 capsule (30 mg total) by mouth daily. Take 1 capsule by mouth x 7 days, then increase to 2 capsules daily 90 capsule 3   magnesium oxide (MAG-OX) 400 (240 Mg) MG tablet TAKE 1 TABLET BY MOUTH TWICE A DAY 60 tablet 2   Multiple Vitamin (MULTIVITAMIN) capsule Take 1 capsule by mouth daily.     Potassium 99 MG TABS Take 99 mg by mouth at bedtime.     No current facility-administered medications for this visit.   Facility-Administered Medications Ordered in Other Visits  Medication Dose Route Frequency Provider Last Rate Last Admin   heparin lock flush 100 unit/mL  500 Units Intravenous Once Derek Jack, MD       sodium chloride flush (NS) 0.9 % injection 10 mL  10 mL Intravenous PRN Derek Jack, MD       sodium chloride flush (NS) 0.9 % injection 10 mL  10 mL Intravenous PRN Derek Jack, MD        ALLERGIES:  Allergies  Allergen Reactions   Augmentin [Amoxicillin-Pot Clavulanate] Diarrhea and Nausea And Vomiting   Doxycycline     Abdominal cramps.   Topamax [Topiramate] Nausea And Vomiting    PHYSICAL EXAM:  Performance status  (ECOG): 1 - Symptomatic but completely ambulatory  There were no vitals filed for this visit.   Wt Readings from Last 3 Encounters:  06/26/22 189 lb 14.4 oz (86.1 kg)  06/05/22 188 lb 12.8 oz (85.6 kg)  05/31/22 185 lb 6.4 oz (84.1 kg)   Physical Exam Vitals reviewed.  Constitutional:      Appearance: Normal appearance. She is obese.     Comments: In wheelchair  Cardiovascular:     Rate and Rhythm: Normal rate and regular rhythm.     Pulses: Normal pulses.     Heart sounds: Normal heart sounds.  Pulmonary:     Effort: Pulmonary  effort is normal.     Breath sounds: Normal breath sounds.  Abdominal:     Palpations: Abdomen is soft. There is no hepatomegaly, splenomegaly or mass.     Tenderness: There is no abdominal tenderness.  Neurological:     General: No focal deficit present.     Mental Status: She is alert and oriented to person, place, and time.  Psychiatric:        Mood and Affect: Mood normal.        Behavior: Behavior normal.    LABORATORY DATA:  I have reviewed the labs as listed.     Latest Ref Rng & Units 06/05/2022    8:01 AM 05/24/2022   12:17 PM 04/16/2022    9:25 AM  CBC  WBC 4.0 - 10.5 K/uL 6.6  5.5  5.0   Hemoglobin 12.0 - 15.0 g/dL 12.5  12.5  11.3   Hematocrit 36.0 - 46.0 % 37.0  37.2  34.1   Platelets 150 - 400 K/uL 265  250  244       Latest Ref Rng & Units 06/05/2022    8:01 AM 05/24/2022   12:17 PM 04/16/2022    9:25 AM  CMP  Glucose 70 - 99 mg/dL 123  101  119   BUN 6 - 20 mg/dL '12  15  12   '$ Creatinine 0.44 - 1.00 mg/dL 0.49  0.51  0.52   Sodium 135 - 145 mmol/L 138  138  139   Potassium 3.5 - 5.1 mmol/L 3.7  3.8  3.7   Chloride 98 - 111 mmol/L 106  107  110   CO2 22 - 32 mmol/L '25  23  23   '$ Calcium 8.9 - 10.3 mg/dL 8.8  8.9  8.6   Total Protein 6.5 - 8.1 g/dL 6.6  6.7  7.0   Total Bilirubin 0.3 - 1.2 mg/dL 1.0  0.9  0.7   Alkaline Phos 38 - 126 U/L 59  56  49   AST 15 - 41 U/L '18  17  20   '$ ALT 0 - 44 U/L '11  11  16     '$ DIAGNOSTIC  IMAGING:  I have independently reviewed the scans and discussed with the patient. No results found.   ASSESSMENT:  High-grade serous carcinoma of peritoneum: - She had developed sharp left-sided abdominal pain since Mother's Day, occasional radiating to the back.  40 pound weight loss in the last 6 months. - Personal history of cervical cancer in 2000, status post chemo plus XRT at [facility].  She has slightly decreased hearing in the left ear since then. - US abdomen (11/08/2021): Cholelithiasis with gallbladder wall thickening and sonographic Murphy sign.  Upper abdominal ascites. - Laparoscopy (11/13/2021): Starting of the omentum as well as the upper abdominal wall and diaphragm.  Liver was within normal limits.  6 L of ascites was evacuated.  Omental and peritoneal biopsies were sent.  Peritoneal implants throughout and a significant portion of omentum and on the small intestine. - CT CAP (11/13/2021): Ascites, extensive peritoneal thickening, nodularity and omental caking.  No evidence of primary malignancy in the chest, abdomen or pelvis.  Small left pleural effusion, nonspecific.  Cholelithiasis. - US pelvis (11/14/2021): Uterus is grossly unremarkable.  Ovaries not visualized. - CA125 (11/13/2021): 1269 - Pathology: Peritoneum and omental biopsy consistent with high-grade serous carcinoma.  HER2 2+ by IHC and negative by FISH. - 4 cycles of carboplatin and paclitaxel from 11/21/2021 through 01/23/2022  Social/family history: - Lives at home with her husband.  Worked in Scientist, research (medical) at [employer].  Non-smoker. - Maternal grandmother with bladder cancer.  Maternal great grandmother with breast cancer.   PLAN:  High-grade serous carcinoma of peritoneum: - Debulking surgery after 4 cycles was delayed due to pulmonary embolism. - CT [facility] on 05/24/2022 showed increased peritoneal carcinomatosis and mild ascites with new moderate distal small bowel obstruction with transition point in the central  pelvis. - She was started back on chemotherapy 3 weeks ago which she tolerated reasonably well. - Reviewed labs today which showed normal LFTs.  CBC was grossly normal. - She will proceed with cycle 6 today.  RTC 3 weeks for follow-up with tumor markers.  Will plan to repeat CT scan after cycle 7.   2.  Peripheral neuropathy: - Continue Cymbalta 60 mg daily.  It is well-controlled.  3.  Diarrhea: - She diarrhea intermittently for 2 weeks after last cycle. - She was instructed to take Lomotil 2 tablets at the first onset of diarrhea followed by 1 tablet after each watery bowel movement.  We have sent the prescription.  4.  Hypomagnesemia: - Continue magnesium twice daily.  She will receive 2 g of magnesium.  5.  Hypokalemia: - Continue potassium 20 mg daily.  Potassium is normal.  6.  Subsegmental pulmonary embolism (Dx 03/06/2022): - Continue Eliquis twice daily.  No bleeding issues reported.   Orders placed this encounter:  No orders of the defined types were placed in this encounter.      Derek Jack, MD [facility] [phone]

## 2022-06-26 NOTE — Progress Notes (Signed)
Patients port flushed without difficulty.  No blood return noted.  Alteplase 2 mg administered at [facility].  Patient remains accessed for chemotherapy treatment.

## 2022-06-26 NOTE — Patient Instructions (Signed)
[facility] at [facility] Discharge Instructions   You were seen and examined today by Dr. Katragadda.     Thank you for choosing [facility] at [facility] to provide your oncology and hematology care.  To afford each patient quality time with our provider, please arrive at least 15 minutes before your scheduled appointment time.   If you have a lab appointment with the Cancer Center please come in thru the Main Entrance and check in at the main information desk.  You need to re-schedule your appointment should you arrive 10 or more minutes late.  We strive to give you quality time with our providers, and arriving late affects you and other patients whose appointments are after yours.  Also, if you no show three or more times for appointments you may be dismissed from the clinic at the providers discretion.     Again, thank you for choosing [facility].  Our hope is that these requests will decrease the amount of time that you wait before being seen by our physicians.       _____________________________________________________________  Should you have questions after your visit to  Cancer Center, please contact our office at [phone] and follow the prompts.  Our office hours are 8:00 a.m. and 4:30 p.m. Monday - Friday.  Please note that voicemails left after 4:00 p.m. may not be returned until the following business day.  We are closed weekends and major holidays.  You do have access to a nurse 24-7, just call the main number to the clinic [phone] and do not press any options, hold on the line and a nurse will answer the phone.    For prescription refill requests, have your pharmacy contact our office and allow 72 hours.    Due to Covid, you will need to wear a mask upon entering the hospital. If you do not have a mask, a mask will be given to you at the Main Entrance upon arrival. For doctor visits, patients may  have 1 support person age 18 or older with them. For treatment visits, patients can not have anyone with them due to social distancing guidelines and our immunocompromised population.      

## 2022-06-26 NOTE — Patient Instructions (Signed)
Nicole Santos  Discharge Instructions: Thank you for choosing [facility] to provide your oncology and hematology care.  If you have a lab appointment with the [facility], please come in thru the Main Entrance and check in at the main information desk.  Wear comfortable clothing and clothing appropriate for easy access to any Portacath or PICC line.   We strive to give you quality time with your provider. You may need to reschedule your appointment if you arrive late (15 or more minutes).  Arriving late affects you and other patients whose appointments are after yours.  Also, if you miss three or more appointments without notifying the office, you may be dismissed from the clinic at the provider's discretion.      For prescription refill requests, have your pharmacy contact our office and allow 72 hours for refills to be completed.    Today you received the following chemotherapy and/or immunotherapy agents Paclitaxel and Carboplatin and 2g IV magnesium sulfate   To help prevent nausea and vomiting after your treatment, we encourage you to take your nausea medication as directed.  BELOW ARE SYMPTOMS THAT SHOULD BE REPORTED IMMEDIATELY: *FEVER GREATER THAN 100.4 F (38 C) OR HIGHER *CHILLS OR SWEATING *NAUSEA AND VOMITING THAT IS NOT CONTROLLED WITH YOUR NAUSEA MEDICATION *UNUSUAL SHORTNESS OF BREATH *UNUSUAL BRUISING OR BLEEDING *URINARY PROBLEMS (pain or burning when urinating, or frequent urination) *BOWEL PROBLEMS (unusual diarrhea, constipation, pain near the anus) TENDERNESS IN MOUTH AND THROAT WITH OR WITHOUT PRESENCE OF ULCERS (sore throat, sores in mouth, or a toothache) UNUSUAL RASH, SWELLING OR PAIN  UNUSUAL VAGINAL DISCHARGE OR ITCHING   Items with * indicate a potential emergency and should be followed up as soon as possible or go to the Emergency Department if any problems should occur.  Please show the CHEMOTHERAPY ALERT CARD or  IMMUNOTHERAPY ALERT CARD at check-in to the Emergency Department and triage nurse.  Should you have questions after your visit or need to cancel or reschedule your appointment, please contact [facility] [phone]  and follow the prompts.  Office hours are 8:00 a.m. to 4:30 p.m. Monday - Friday. Please note that voicemails left after 4:00 p.m. may not be returned until the following business day.  We are closed weekends and major holidays. You have access to a nurse at all times for urgent questions. Please call the main number to the clinic [phone] and follow the prompts.  For any non-urgent questions, you may also contact your provider using MyChart. We now offer e-Visits for anyone 58 and older to request care online for non-urgent symptoms. For details visit [email or web address].   Also download the MyChart app! Go to the app store, search "MyChart", open the app, select [facility], and log in with your MyChart username and password.

## 2022-06-27 LAB — CA 125: Cancer Antigen (CA) 125: 16 U/mL (ref 0.0–38.1)

## 2022-06-28 ENCOUNTER — Inpatient Hospital Stay: Payer: Commercial Managed Care - PPO

## 2022-06-28 VITALS — BP 116/67 | HR 99 | Temp 97.6°F | Resp 18

## 2022-06-28 DIAGNOSIS — Z5111 Encounter for antineoplastic chemotherapy: Secondary | ICD-10-CM | POA: Diagnosis not present

## 2022-06-28 DIAGNOSIS — C482 Malignant neoplasm of peritoneum, unspecified: Secondary | ICD-10-CM

## 2022-06-28 MED ORDER — PEGFILGRASTIM INJECTION 6 MG/0.6ML ~~LOC~~
6.0000 mg | PREFILLED_SYRINGE | Freq: Once | SUBCUTANEOUS | Status: AC
  Administered 2022-06-28: 6 mg via SUBCUTANEOUS
  Filled 2022-06-28: qty 0.6

## 2022-06-28 NOTE — Progress Notes (Signed)
Patient tolerated injection with no complaints voiced. Site clean and dry with no bruising or swelling noted at site. See MAR for details. Band aid applied.  Patient stable during and after injection. VSS with discharge and left in satisfactory condition with no s/s of distress noted.  

## 2022-06-28 NOTE — Patient Instructions (Signed)
[facility]  Discharge Instructions: Thank you for choosing [facility] to provide your oncology and hematology care.  If you have a lab appointment with the [facility], please come in thru the Main Entrance and check in at the main information desk.  Wear comfortable clothing and clothing appropriate for easy access to any Portacath or PICC line.   We strive to give you quality time with your provider. You may need to reschedule your appointment if you arrive late (15 or more minutes).  Arriving late affects you and other patients whose appointments are after yours.  Also, if you miss three or more appointments without notifying the office, you may be dismissed from the clinic at the provider's discretion.      For prescription refill requests, have your pharmacy contact our office and allow 72 hours for refills to be completed.    Today you received the following neulasta, return as scheduled.   To help prevent nausea and vomiting after your treatment, we encourage you to take your nausea medication as directed.  BELOW ARE SYMPTOMS THAT SHOULD BE REPORTED IMMEDIATELY: *FEVER GREATER THAN 100.4 F (38 C) OR HIGHER *CHILLS OR SWEATING *NAUSEA AND VOMITING THAT IS NOT CONTROLLED WITH YOUR NAUSEA MEDICATION *UNUSUAL SHORTNESS OF BREATH *UNUSUAL BRUISING OR BLEEDING *URINARY PROBLEMS (pain or burning when urinating, or frequent urination) *BOWEL PROBLEMS (unusual diarrhea, constipation, pain near the anus) TENDERNESS IN MOUTH AND THROAT WITH OR WITHOUT PRESENCE OF ULCERS (sore throat, sores in mouth, or a toothache) UNUSUAL RASH, SWELLING OR PAIN  UNUSUAL VAGINAL DISCHARGE OR ITCHING   Items with * indicate a potential emergency and should be followed up as soon as possible or go to the Emergency Department if any problems should occur.  Please show the CHEMOTHERAPY ALERT CARD or IMMUNOTHERAPY ALERT CARD at check-in to the Emergency Department and  triage nurse.  Should you have questions after your visit or need to cancel or reschedule your appointment, please contact [facility] [phone]  and follow the prompts.  Office hours are 8:00 a.m. to 4:30 p.m. Monday - Friday. Please note that voicemails left after 4:00 p.m. may not be returned until the following business day.  We are closed weekends and major holidays. You have access to a nurse at all times for urgent questions. Please call the main number to the clinic [phone] and follow the prompts.  For any non-urgent questions, you may also contact your provider using MyChart. We now offer e-Visits for anyone 6 and older to request care online for non-urgent symptoms. For details visit [email or web address].   Also download the MyChart app! Go to the app store, search "MyChart", open the app, select [facility], and log in with your MyChart username and password.

## 2022-07-16 ENCOUNTER — Inpatient Hospital Stay (HOSPITAL_BASED_OUTPATIENT_CLINIC_OR_DEPARTMENT_OTHER): Payer: Commercial Managed Care - PPO | Admitting: Hematology

## 2022-07-16 ENCOUNTER — Inpatient Hospital Stay: Payer: Commercial Managed Care - PPO

## 2022-07-16 ENCOUNTER — Encounter: Payer: Self-pay | Admitting: Hematology

## 2022-07-16 ENCOUNTER — Inpatient Hospital Stay: Payer: Commercial Managed Care - PPO | Attending: Gynecologic Oncology

## 2022-07-16 VITALS — BP 123/76 | HR 99 | Temp 96.8°F | Resp 18 | Wt 187.9 lb

## 2022-07-16 VITALS — BP 109/64 | HR 103 | Temp 98.1°F | Resp 19

## 2022-07-16 DIAGNOSIS — Z5189 Encounter for other specified aftercare: Secondary | ICD-10-CM | POA: Insufficient documentation

## 2022-07-16 DIAGNOSIS — C482 Malignant neoplasm of peritoneum, unspecified: Secondary | ICD-10-CM | POA: Insufficient documentation

## 2022-07-16 DIAGNOSIS — Z452 Encounter for adjustment and management of vascular access device: Secondary | ICD-10-CM | POA: Insufficient documentation

## 2022-07-16 DIAGNOSIS — Z5111 Encounter for antineoplastic chemotherapy: Secondary | ICD-10-CM | POA: Insufficient documentation

## 2022-07-16 DIAGNOSIS — C569 Malignant neoplasm of unspecified ovary: Secondary | ICD-10-CM

## 2022-07-16 DIAGNOSIS — Z79899 Other long term (current) drug therapy: Secondary | ICD-10-CM | POA: Diagnosis not present

## 2022-07-16 LAB — CBC WITH DIFFERENTIAL/PLATELET
Abs Immature Granulocytes: 0.01 10*3/uL (ref 0.00–0.07)
Basophils Absolute: 0 10*3/uL (ref 0.0–0.1)
Basophils Relative: 1 %
Eosinophils Absolute: 0.1 10*3/uL (ref 0.0–0.5)
Eosinophils Relative: 2 %
HCT: 30.7 % — ABNORMAL LOW (ref 36.0–46.0)
Hemoglobin: 10.1 g/dL — ABNORMAL LOW (ref 12.0–15.0)
Immature Granulocytes: 0 %
Lymphocytes Relative: 26 %
Lymphs Abs: 1.1 10*3/uL (ref 0.7–4.0)
MCH: 32.9 pg (ref 26.0–34.0)
MCHC: 32.9 g/dL (ref 30.0–36.0)
MCV: 100 fL (ref 80.0–100.0)
Monocytes Absolute: 0.5 10*3/uL (ref 0.1–1.0)
Monocytes Relative: 12 %
Neutro Abs: 2.4 10*3/uL (ref 1.7–7.7)
Neutrophils Relative %: 59 %
Platelets: 203 10*3/uL (ref 150–400)
RBC: 3.07 MIL/uL — ABNORMAL LOW (ref 3.87–5.11)
RDW: 15.6 % — ABNORMAL HIGH (ref 11.5–15.5)
WBC: 4.1 10*3/uL (ref 4.0–10.5)
nRBC: 0 % (ref 0.0–0.2)

## 2022-07-16 LAB — COMPREHENSIVE METABOLIC PANEL
ALT: 13 U/L (ref 0–44)
AST: 17 U/L (ref 15–41)
Albumin: 3.2 g/dL — ABNORMAL LOW (ref 3.5–5.0)
Alkaline Phosphatase: 64 U/L (ref 38–126)
Anion gap: 8 (ref 5–15)
BUN: 11 mg/dL (ref 6–20)
CO2: 27 mmol/L (ref 22–32)
Calcium: 8.7 mg/dL — ABNORMAL LOW (ref 8.9–10.3)
Chloride: 105 mmol/L (ref 98–111)
Creatinine, Ser: 0.59 mg/dL (ref 0.44–1.00)
GFR, Estimated: >60 mL/min (ref >60)
Glucose, Bld: 118 mg/dL — ABNORMAL HIGH (ref 70–99)
Potassium: 4.1 mmol/L (ref 3.5–5.1)
Sodium: 140 mmol/L (ref 135–145)
Total Bilirubin: 0.7 mg/dL (ref 0.3–1.2)
Total Protein: 6.4 g/dL — ABNORMAL LOW (ref 6.5–8.1)

## 2022-07-16 LAB — MAGNESIUM: Magnesium: 1.8 mg/dL (ref 1.7–2.4)

## 2022-07-16 MED ORDER — SODIUM CHLORIDE 0.9 % IV SOLN
10.0000 mg | Freq: Once | INTRAVENOUS | Status: AC
  Administered 2022-07-16: 10 mg via INTRAVENOUS
  Filled 2022-07-16: qty 10

## 2022-07-16 MED ORDER — HEPARIN SOD (PORK) LOCK FLUSH 100 UNIT/ML IV SOLN
500.0000 [IU] | Freq: Once | INTRAVENOUS | Status: AC | PRN
  Administered 2022-07-16: 500 [IU]

## 2022-07-16 MED ORDER — CETIRIZINE HCL 10 MG/ML IV SOLN
10.0000 mg | Freq: Once | INTRAVENOUS | Status: AC
  Administered 2022-07-16: 10 mg via INTRAVENOUS
  Filled 2022-07-16: qty 1

## 2022-07-16 MED ORDER — FAMOTIDINE IN NACL 20-0.9 MG/50ML-% IV SOLN
20.0000 mg | Freq: Once | INTRAVENOUS | Status: AC
  Administered 2022-07-16: 20 mg via INTRAVENOUS
  Filled 2022-07-16: qty 50

## 2022-07-16 MED ORDER — SODIUM CHLORIDE 0.9% FLUSH
10.0000 mL | INTRAVENOUS | Status: DC | PRN
  Administered 2022-07-16: 10 mL

## 2022-07-16 MED ORDER — SODIUM CHLORIDE 0.9 % IV SOLN
140.0000 mg/m2 | Freq: Once | INTRAVENOUS | Status: AC
  Administered 2022-07-16: 276 mg via INTRAVENOUS
  Filled 2022-07-16: qty 46

## 2022-07-16 MED ORDER — SODIUM CHLORIDE 0.9 % IV SOLN: Freq: Once | INTRAVENOUS | Status: AC

## 2022-07-16 MED ORDER — ALTEPLASE 2 MG IJ SOLR
2.0000 mg | Freq: Once | INTRAMUSCULAR | Status: AC | PRN
  Administered 2022-07-16: 2 mg
  Filled 2022-07-16: qty 2

## 2022-07-16 MED ORDER — SODIUM CHLORIDE 0.9 % IV SOLN
720.0000 mg | Freq: Once | INTRAVENOUS | Status: AC
  Administered 2022-07-16: 700 mg via INTRAVENOUS
  Filled 2022-07-16: qty 70

## 2022-07-16 MED ORDER — SODIUM CHLORIDE 0.9% FLUSH
10.0000 mL | INTRAVENOUS | Status: DC | PRN
  Administered 2022-07-16: 10 mL via INTRAVENOUS

## 2022-07-16 MED ORDER — PALONOSETRON HCL INJECTION 0.25 MG/5ML
0.2500 mg | Freq: Once | INTRAVENOUS | Status: AC
  Administered 2022-07-16: 0.25 mg via INTRAVENOUS
  Filled 2022-07-16: qty 5

## 2022-07-16 MED ORDER — SODIUM CHLORIDE 0.9 % IV SOLN
150.0000 mg | Freq: Once | INTRAVENOUS | Status: AC
  Administered 2022-07-16: 150 mg via INTRAVENOUS
  Filled 2022-07-16: qty 150

## 2022-07-16 NOTE — Patient Instructions (Signed)
[facility]  Discharge Instructions: Thank you for choosing [facility] to provide your oncology and hematology care.  If you have a lab appointment with the [facility], please come in thru the Main Entrance and check in at the main information desk.  Wear comfortable clothing and clothing appropriate for easy access to any Portacath or PICC line.   We strive to give you quality time with your provider. You may need to reschedule your appointment if you arrive late (15 or more minutes).  Arriving late affects you and other patients whose appointments are after yours.  Also, if you miss three or more appointments without notifying the office, you may be dismissed from the clinic at the provider's discretion.      For prescription refill requests, have your pharmacy contact our office and allow 72 hours for refills to be completed.    Today you received the following chemotherapy and/or immunotherapy agents Taxol/Carboplatin      To help prevent nausea and vomiting after your treatment, we encourage you to take your nausea medication as directed.  BELOW ARE SYMPTOMS THAT SHOULD BE REPORTED IMMEDIATELY: *FEVER GREATER THAN 100.4 F (38 C) OR HIGHER *CHILLS OR SWEATING *NAUSEA AND VOMITING THAT IS NOT CONTROLLED WITH YOUR NAUSEA MEDICATION *UNUSUAL SHORTNESS OF BREATH *UNUSUAL BRUISING OR BLEEDING *URINARY PROBLEMS (pain or burning when urinating, or frequent urination) *BOWEL PROBLEMS (unusual diarrhea, constipation, pain near the anus) TENDERNESS IN MOUTH AND THROAT WITH OR WITHOUT PRESENCE OF ULCERS (sore throat, sores in mouth, or a toothache) UNUSUAL RASH, SWELLING OR PAIN  UNUSUAL VAGINAL DISCHARGE OR ITCHING   Items with * indicate a potential emergency and should be followed up as soon as possible or go to the Emergency Department if any problems should occur.  Please show the CHEMOTHERAPY ALERT CARD or IMMUNOTHERAPY ALERT CARD at check-in to the  Emergency Department and triage nurse.  Should you have questions after your visit or need to cancel or reschedule your appointment, please contact [facility] [phone]  and follow the prompts.  Office hours are 8:00 a.m. to 4:30 p.m. Monday - Friday. Please note that voicemails left after 4:00 p.m. may not be returned until the following business day.  We are closed weekends and major holidays. You have access to a nurse at all times for urgent questions. Please call the main number to the clinic [phone] and follow the prompts.  For any non-urgent questions, you may also contact your provider using MyChart. We now offer e-Visits for anyone 23 and older to request care online for non-urgent symptoms. For details visit [email or web address].   Also download the MyChart app! Go to the app store, search "MyChart", open the app, select [facility], and log in with your MyChart username and password.

## 2022-07-16 NOTE — Patient Instructions (Signed)
Cancer Center at [facility] Discharge Instructions   You were seen and examined today by Dr. Katragadda.  He reviewed the results of your lab work which are normal/stable.   We will proceed with your treatment today.  Return as scheduled.    Thank you for choosing  Cancer Center at [facility] to provide your oncology and hematology care.  To afford each patient quality time with our provider, please arrive at least 15 minutes before your scheduled appointment time.   If you have a lab appointment with the Cancer Center please come in thru the Main Entrance and check in at the main information desk.  You need to re-schedule your appointment should you arrive 10 or more minutes late.  We strive to give you quality time with our providers, and arriving late affects you and other patients whose appointments are after yours.  Also, if you no show three or more times for appointments you may be dismissed from the clinic at the providers discretion.     Again, thank you for choosing [facility].  Our hope is that these requests will decrease the amount of time that you wait before being seen by our physicians.       _____________________________________________________________  Should you have questions after your visit to [facility], please contact our office at [phone] and follow the prompts.  Our office hours are 8:00 a.m. and 4:30 p.m. Monday - Friday.  Please note that voicemails left after 4:00 p.m. may not be returned until the following business day.  We are closed weekends and major holidays.  You do have access to a nurse 24-7, just call the main number to the clinic [phone] and do not press any options, hold on the line and a nurse will answer the phone.    For prescription refill requests, have your pharmacy contact our office and allow 72 hours.    Due to Covid, you will need to wear a mask upon entering  the hospital. If you do not have a mask, a mask will be given to you at the Main Entrance upon arrival. For doctor visits, patients may have 1 support person age 18 or older with them. For treatment visits, patients can not have anyone with them due to social distancing guidelines and our immunocompromised population.      

## 2022-07-16 NOTE — Progress Notes (Signed)
[place] [address], [place] [address]    Clinic Day:  07/16/2022  Referring physician: Janora Norlander, DO  Patient Care Team: Janora Norlander, DO as PCP - General (Family Medicine) Harlen Labs, MD as Referring Physician (Optometry) Derek Jack, MD as Medical Oncologist (Medical Oncology) Brien Mates, RN as Oncology Nurse Navigator (Medical Oncology)   ASSESSMENT & PLAN:   Assessment: High-grade serous carcinoma of peritoneum: - She had developed sharp left-sided abdominal pain since Mother's Day, occasional radiating to the back.  40 pound weight loss in the last 6 months. - Personal history of cervical cancer in 2000, status post chemo plus XRT at [facility].  She has slightly decreased hearing in the left ear since then. - US abdomen (11/08/2021): Cholelithiasis with gallbladder wall thickening and sonographic Murphy sign.  Upper abdominal ascites. - Laparoscopy (11/13/2021): Starting of the omentum as well as the upper abdominal wall and diaphragm.  Liver was within normal limits.  6 L of ascites was evacuated.  Omental and peritoneal biopsies were sent.  Peritoneal implants throughout and a significant portion of omentum and on the small intestine. - CT CAP (11/13/2021): Ascites, extensive peritoneal thickening, nodularity and omental caking.  No evidence of primary malignancy in the chest, abdomen or pelvis.  Small left pleural effusion, nonspecific.  Cholelithiasis. - US pelvis (11/14/2021): Uterus is grossly unremarkable.  Ovaries not visualized. - CA125 (11/13/2021): 1269 - Pathology: Peritoneum and omental biopsy consistent with high-grade serous carcinoma.  HER2 2+ by IHC and negative by FISH. - 4 cycles of carboplatin and paclitaxel from 11/21/2021 through 01/23/2022    Social/family history: - Lives at home with her husband.  Worked in Scientist, research (medical) at [employer].  Non-smoker. - Maternal grandmother with bladder cancer.  Maternal  great grandmother with breast cancer.  Plan: High-grade serous carcinoma of peritoneum: - Cycle 7 of chemotherapy on 07/16/2022.  She had diarrhea for couple of days and subsided.  Previous surgery was delayed due to pulmonary embolism. - She has tolerated it reasonably well.  Neuropathy is stable. - Reviewed labs today which showed normal LFTs.  CBC was grossly normal. - Proceed with cycle 7 today. - Will arrange for Dr. Berline Lopes follow-up visit. - RTC 3 weeks with CT AP with contrast and CEA level.     2.  Peripheral neuropathy: - Continue Cymbalta 60 mg daily.  2 this is well-controlled.  3.  Diarrhea: - She had diarrhea for 2 days.  Continue Lomotil as needed.  4.  Hypomagnesemia: - Continue magnesium twice daily.  Magnesium is normal.   5.  Hypokalemia: - Continue potassium 20 mEq daily.  Potassium is normal.   6.  Subsegmental pulmonary embolism (Dx 03/06/2022): - Continue Eliquis twice daily.  No bleeding issues reported.  Orders Placed This Encounter  Procedures   CT Abdomen Pelvis W Contrast    Standing Status:   Future    Standing Expiration Date:   07/12/2023    Order Specific Question:   If indicated for the ordered procedure, I authorize the administration of contrast media per Radiology protocol    Answer:   Yes    Order Specific Question:   Does the patient have a contrast media/X-ray dye allergy?    Answer:   No    Order Specific Question:   Is patient pregnant?    Answer:   No    Order Specific Question:   Preferred imaging location?    Answer:  [facility]    Order Specific Question:   Is Oral Contrast requested for this exam?    Answer:   Yes, Per Radiology protocol      Nicole Santos as a scribe for Derek Jack, MD.,have documented all relevant documentation on the behalf of Derek Jack, MD,as directed by  Derek Jack, MD while in the presence of Derek Jack, MD.   I, Derek Jack MD, have  reviewed the above documentation for accuracy and completeness, and I agree with the above.   Doyce Loose   2/12/20248:09 AM  CHIEF COMPLAINT:   Diagnosis: high-grade serous carcinoma of peritoneum   Cancer Staging  Primary peritoneal carcinomatosis [facility]) Staging form: Ovary, Fallopian Tube, and Primary Peritoneal Carcinoma, AJCC 8th Edition - Clinical stage from 11/17/2021: FIGO Stage IIIC (cT3c, cM0) - Unsigned    Prior Therapy: Carboplatin and paclitaxel x 4 cycles, completed on 01/23/2022   Current Therapy:  Carboplatin (AUC 6) + Paclitaxel   HISTORY OF PRESENT ILLNESS:   Oncology History  Primary peritoneal carcinomatosis ([place])  11/17/2021 Initial Diagnosis   Primary peritoneal carcinomatosis ([place])   11/21/2021 - 01/25/2022 Chemotherapy   Patient is on Treatment Plan : OVARIAN Carboplatin (AUC 6) / Paclitaxel (175) q21d x 6 cycles     11/21/2021 -  Chemotherapy   Patient is on Treatment Plan : OVARIAN Carboplatin (AUC 6) + Paclitaxel (175) q21d X 6 Cycles        INTERVAL HISTORY:   Nicole Santos is a 52 y.o. female presenting to clinic today for follow up of high-grade serous carcinoma of peritoneum. She was last seen by me on 06/26/22.  Today, she states that she is doing well overall. Her appetite level is at 100%. Her energy level is at 100%. She tolerated her last treatment well. She had diarrhea for a few days after last treatment of chemotherapy but had relief with Lomotil. She denies any nausea and vomiting. She is currently taking Mag-Ox 437m. She is currently taking potassium supplements. She currently takes Cymbalta for her unchanged neuropathy. She has not yet heard back from Dr. TCharisse Marchoffice for follow up.   PAST MEDICAL HISTORY:   Past Medical History: Past Medical History:  Diagnosis Date   Acute respiratory disease due to COVID-19 virus 02/02/2019   Anxiety    Cancer ([place]    Depression    Dyspnea    Dysrhythmia    History of diabetes mellitus     Resolved in Sept 2023 with weight loss.   Hypertension    Ovarian cancer ([place] 11/14/2021   Peritoneal carcinomatosis ([place]    Pneumonia    Sleep apnea    doesn't use CPAP   Tachycardia     Surgical History: Past Surgical History:  Procedure Laterality Date   KNEE SURGERY Right    Arthroscopic   LAPAROSCOPIC ABDOMINAL EXPLORATION N/A 11/13/2021   Procedure: Exploratory laparoscopy;  Surgeon: JAviva Signs MD;  Location: [facility];  Service: General;  Laterality: N/A;   PORTACATH PLACEMENT Left 11/15/2021   Procedure: INSERTION PORT-A-CATH;  Surgeon: JAviva Signs MD;  Location: [facility];  Service: General;  Laterality: Left;   TONSILLECTOMY AND ADENOIDECTOMY      Social History: Social History   Socioeconomic History   Marital status: Married    Spouse name: Not on file   Number of children: Not on file   Years of education: Not on file   Highest education level: Not on file  Occupational History   Occupation: Unemployed  Tobacco Use   Smoking status: Never   Smokeless tobacco: Never  Vaping Use   Vaping Use: Never used  Substance and Sexual Activity   Alcohol use: No   Drug use: No   Sexual activity: Not Currently  Other Topics Concern   Not on file  Social History Narrative   Not on file   Social Determinants of Health   Financial Resource Strain: Not on file  Food Insecurity: No Food Insecurity (03/06/2022)   Hunger Vital Sign    Worried About Running Out of Food in the Last Year: Never true    Ran Out of Food in the Last Year: Never true  Transportation Needs: No Transportation Needs (03/06/2022)   PRAPARE - Hydrologist (Medical): No    Lack of Transportation (Non-Medical): No  Physical Activity: Not on file  Stress: Not on file  Social Connections: Not on file  Intimate Partner Violence: Not At Risk (03/06/2022)   Humiliation, Afraid, Rape, and Kick questionnaire    Fear of Current or Ex-Partner: No    Emotionally Abused: No     Physically Abused: No    Sexually Abused: No    Family History: Family History  Problem Relation Age of Onset   Hypertension Mother    Bladder Cancer Maternal Grandmother    Breast cancer Maternal Great-grandmother    Colon cancer Neg Hx    Ovarian cancer Neg Hx    Endometrial cancer Neg Hx    Pancreatic cancer Neg Hx    Prostate cancer Neg Hx     Current Medications:  Current Outpatient Medications:    apixaban (ELIQUIS) 5 MG TABS tablet, Take 2 tablets (10 mg total) by mouth 2 (two) times daily. Then on 03/14/22, take 1 tab (5 mg) two times daily., Disp: 74 tablet, Rfl: 1   atorvastatin (LIPITOR) 20 MG tablet, Take 1 tablet (20 mg total) by mouth daily. (Patient taking differently: Take 20 mg by mouth at bedtime.), Disp: 90 tablet, Rfl: 1   diphenoxylate-atropine (LOMOTIL) 2.5-0.025 MG tablet, Take 2 tablets by mouth after the first watery bowel movement. May then take 1 pill by mouth after each watery bowel movement. Do not exceed 8 pills in a 24 hour period., Disp: 60 tablet, Rfl: 3   DULoxetine (CYMBALTA) 30 MG capsule, Take 1 capsule (30 mg total) by mouth daily. Take 1 capsule by mouth x 7 days, then increase to 2 capsules daily, Disp: 90 capsule, Rfl: 3   magnesium oxide (MAG-OX) 400 (240 Mg) MG tablet, TAKE 1 TABLET BY MOUTH TWICE A DAY, Disp: 60 tablet, Rfl: 2   Multiple Vitamin (MULTIVITAMIN) capsule, Take 1 capsule by mouth daily., Disp: , Rfl:    Potassium 99 MG TABS, Take 99 mg by mouth at bedtime., Disp: , Rfl:  No current facility-administered medications for this visit.  Facility-Administered Medications Ordered in Other Visits:    heparin lock flush 100 unit/mL, 500 Units, Intravenous, Once, Derek Jack, MD   sodium chloride flush (NS) 0.9 % injection 10 mL, 10 mL, Intravenous, PRN, Derek Jack, MD   Allergies: Allergies  Allergen Reactions   Augmentin [Amoxicillin-Pot Clavulanate] Diarrhea and Nausea And Vomiting   Doxycycline     Abdominal  cramps.   Topamax [Topiramate] Nausea And Vomiting    REVIEW OF SYSTEMS:   Review of Systems  Constitutional:  Negative for chills, fatigue and fever.  HENT:   Negative for lump/mass, mouth sores, nosebleeds, sore throat and trouble swallowing.  Eyes:  Negative for eye problems.  Respiratory:  Negative for cough and shortness of breath.   Cardiovascular:  Negative for chest pain, leg swelling and palpitations.  Gastrointestinal:  Negative for abdominal pain, constipation, diarrhea, nausea and vomiting.  Genitourinary:  Negative for bladder incontinence, difficulty urinating, dysuria, frequency, hematuria and nocturia.   Musculoskeletal:  Negative for arthralgias, back pain, flank pain, myalgias and neck pain.  Skin:  Negative for itching and rash.  Neurological:  Positive for numbness (Feet unchanged). Negative for dizziness and headaches.  Hematological:  Does not bruise/bleed easily.  Psychiatric/Behavioral:  Positive for sleep disturbance (Sometimes). Negative for depression and suicidal ideas. The patient is not nervous/anxious.   All other systems reviewed and are negative.    VITALS:   There were no vitals taken for this visit.  Wt Readings from Last 3 Encounters:  06/26/22 189 lb 14.4 oz (86.1 kg)  06/05/22 188 lb 12.8 oz (85.6 kg)  05/31/22 185 lb 6.4 oz (84.1 kg)    There is no height or weight on file to calculate BMI.  Performance status (ECOG): 1 - Symptomatic but completely ambulatory  PHYSICAL EXAM:   Physical Exam Vitals and nursing note reviewed. Exam conducted with a chaperone present.  Constitutional:      Appearance: Normal appearance.  Cardiovascular:     Rate and Rhythm: Normal rate and regular rhythm.     Pulses: Normal pulses.     Heart sounds: Normal heart sounds.  Pulmonary:     Effort: Pulmonary effort is normal.     Breath sounds: Normal breath sounds.  Abdominal:     Palpations: Abdomen is soft. There is no hepatomegaly, splenomegaly or  mass.     Tenderness: There is no abdominal tenderness.  Musculoskeletal:     Right lower leg: No edema.     Left lower leg: No edema.  Lymphadenopathy:     Cervical: No cervical adenopathy.     Right cervical: No superficial, deep or posterior cervical adenopathy.    Left cervical: No superficial, deep or posterior cervical adenopathy.     Upper Body:     Right upper body: No supraclavicular or axillary adenopathy.     Left upper body: No supraclavicular or axillary adenopathy.  Neurological:     General: No focal deficit present.     Mental Status: She is alert and oriented to person, place, and time.  Psychiatric:        Mood and Affect: Mood normal.        Behavior: Behavior normal.     LABS:      Latest Ref Rng & Units 06/26/2022    8:27 AM 06/05/2022    8:01 AM 05/24/2022   12:17 PM  CBC  WBC 4.0 - 10.5 K/uL 4.5  6.6  5.5   Hemoglobin 12.0 - 15.0 g/dL 10.8  12.5  12.5   Hematocrit 36.0 - 46.0 % 32.1  37.0  37.2   Platelets 150 - 400 K/uL 198  265  250       Latest Ref Rng & Units 06/26/2022    8:27 AM 06/05/2022    8:01 AM 05/24/2022   12:17 PM  CMP  Glucose 70 - 99 mg/dL 111  123  101   BUN 6 - 20 mg/dL 14  12  15   $ Creatinine 0.44 - 1.00 mg/dL 0.59  0.49  0.51   Sodium 135 - 145 mmol/L 137  138  138   Potassium 3.5 - 5.1  mmol/L 3.5  3.7  3.8   Chloride 98 - 111 mmol/L 104  106  107   CO2 22 - 32 mmol/L 26  25  23   $ Calcium 8.9 - 10.3 mg/dL 8.5  8.8  8.9   Total Protein 6.5 - 8.1 g/dL 6.3  6.6  6.7   Total Bilirubin 0.3 - 1.2 mg/dL 0.6  1.0  0.9   Alkaline Phos 38 - 126 U/L 62  59  56   AST 15 - 41 U/L 17  18  17   $ ALT 0 - 44 U/L 16  11  11      $ Lab Results  Component Value Date   CEA1 1.1 11/13/2021   /  CEA  Date Value Ref Range Status  11/13/2021 1.1 0.0 - 4.7 ng/mL Final    Comment:    (NOTE)                             Nonsmokers          <3.9                             Smokers             <5.6 [employer] Electrochemiluminescence  Immunoassay (ECLIA) Values obtained with different assay methods or kits cannot be used interchangeably.  Results cannot be interpreted as absolute evidence of the presence or absence of malignant disease. Performed At: [facility] [place], [place] [id number] Rush Farmer MD [id number]    No results found for: "PSA1" Lab Results  Component Value Date   CAN199 6 11/13/2021   Lab Results  Component Value Date   CAN125 16.0 06/26/2022    No results found for: "TOTALPROTELP", "ALBUMINELP", "A1GS", "A2GS", "BETS", "BETA2SER", "GAMS", "MSPIKE", "SPEI" Lab Results  Component Value Date   FERRITIN 356 (H) 02/07/2019   FERRITIN 311 (H) 02/06/2019   FERRITIN 343 (H) 02/05/2019   Lab Results  Component Value Date   LDH 259 (H) 02/02/2019     STUDIES:   No results found.

## 2022-07-16 NOTE — Progress Notes (Signed)
Patient has been examined by Dr. Katragadda, and vital signs and labs have been reviewed. ANC, Creatinine, LFTs, hemoglobin, and platelets are within treatment parameters per M.D. - pt may proceed with treatment.  Primary RN and pharmacy notified.  

## 2022-07-16 NOTE — Progress Notes (Signed)
Patient presents today for Taxol/Carboplatin infusions per providers order.  Vitals signs within parameters for treatment.  Labs pending.  Patient has no new complaints at this time.  Message received from Anastasio Champion RN/Dr. Delton Coombes patient okay for treatment.  Patients Port not giving blood return.  Alteplase administered at 0830, 0930 and still no blood return noted.  MD made aware.  Per Delton Coombes instructions to remove Alteplase and rapid bolus with normal saline over 5-10 minutes, if still no blood return then patient will be sent for a dye study.  Blood return noted after rapid bolus, proceeding with treatment per MD.  Treatment given today per MD orders.  Table during infusion without adverse affects.  Vital signs stable.  No complaints at this time.  Discharge from clinic ambulatory in stable condition.  Alert and oriented X 3.  Follow up with [facility] as scheduled.

## 2022-07-18 ENCOUNTER — Inpatient Hospital Stay: Payer: Commercial Managed Care - PPO

## 2022-07-18 VITALS — BP 108/61 | HR 87 | Temp 97.0°F | Resp 20

## 2022-07-18 DIAGNOSIS — C482 Malignant neoplasm of peritoneum, unspecified: Secondary | ICD-10-CM

## 2022-07-18 DIAGNOSIS — Z5111 Encounter for antineoplastic chemotherapy: Secondary | ICD-10-CM | POA: Diagnosis not present

## 2022-07-18 LAB — CA 125: Cancer Antigen (CA) 125: 11.6 U/mL (ref 0.0–38.1)

## 2022-07-18 MED ORDER — PEGFILGRASTIM INJECTION 6 MG/0.6ML ~~LOC~~
6.0000 mg | PREFILLED_SYRINGE | Freq: Once | SUBCUTANEOUS | Status: AC
  Administered 2022-07-18: 6 mg via SUBCUTANEOUS
  Filled 2022-07-18: qty 0.6

## 2022-07-18 NOTE — Patient Instructions (Signed)
[facility]  Discharge Instructions: Thank you for choosing [facility] to provide your oncology and hematology care.  If you have a lab appointment with the [facility], please come in thru the Main Entrance and check in at the main information desk.  Wear comfortable clothing and clothing appropriate for easy access to any Portacath or PICC line.   We strive to give you quality time with your provider. You may need to reschedule your appointment if you arrive late (15 or more minutes).  Arriving late affects you and other patients whose appointments are after yours.  Also, if you miss three or more appointments without notifying the office, you may be dismissed from the clinic at the provider's discretion.      For prescription refill requests, have your pharmacy contact our office and allow 72 hours for refills to be completed.    Today you received the following chemotherapy and/or immunotherapy agents Neulasta injection. Pegfilgrastim Injection What is this medication? PEGFILGRASTIM (PEG fil gra stim) lowers the risk of infection in people who are receiving chemotherapy. It works by Building control surveyor make more white blood cells, which protects your body from infection. It may also be used to help people who have been exposed to high doses of radiation. This medicine may be used for other purposes; ask your health care provider or pharmacist if you have questions. COMMON BRAND NAME(S): [employer], Neulasta, Nyvepria, Stimufend, UDENYCA, Ziextenzo What should I tell my care team before I take this medication? They need to know if you have any of these conditions: Kidney disease Latex allergy Ongoing radiation therapy Sickle cell disease Skin reactions to acrylic adhesives (On-Body Injector only) An unusual or allergic reaction to pegfilgrastim, filgrastim, other medications, foods, dyes, or preservatives Pregnant or trying to get  pregnant Breast-feeding How should I use this medication? This medication is for injection under the skin. If you get this medication at home, you will be taught how to prepare and give the pre-filled syringe or how to use the On-body Injector. Refer to the patient Instructions for Use for detailed instructions. Use exactly as directed. Tell your care team immediately if you suspect that the On-body Injector may not have performed as intended or if you suspect the use of the On-body Injector resulted in a missed or partial dose. It is important that you put your used needles and syringes in a special sharps container. Do not put them in a trash can. If you do not have a sharps container, call your pharmacist or care team to get one. Talk to your care team about the use of this medication in children. While this medication may be prescribed for selected conditions, precautions do apply. Overdosage: If you think you have taken too much of this medicine contact a poison control center or emergency room at once. NOTE: This medicine is only for you. Do not share this medicine with others. What if I miss a dose? It is important not to miss your dose. Call your care team if you miss your dose. If you miss a dose due to an On-body Injector failure or leakage, a new dose should be administered as soon as possible using a single prefilled syringe for manual use. What may interact with this medication? Interactions have not been studied. This list may not describe all possible interactions. Give your health care provider a list of all the medicines, herbs, non-prescription drugs, or dietary supplements you use. Also tell  them if you smoke, drink alcohol, or use illegal drugs. Some items may interact with your medicine. What should I watch for while using this medication? Your condition will be monitored carefully while you are receiving this medication. You may need blood work done while you are taking this  medication. Talk to your care team about your risk of cancer. You may be more at risk for certain types of cancer if you take this medication. If you are going to need a MRI, CT scan, or other procedure, tell your care team that you are using this medication (On-Body Injector only). What side effects may I notice from receiving this medication? Side effects that you should report to your care team as soon as possible: Allergic reactions--skin rash, itching, hives, swelling of the face, lips, tongue, or throat Capillary leak syndrome--stomach or muscle pain, unusual weakness or fatigue, feeling faint or lightheaded, decrease in the amount of urine, swelling of the ankles, hands, or feet, trouble breathing High white blood cell level--fever, fatigue, trouble breathing, night sweats, change in vision, weight loss Inflammation of the aorta--fever, fatigue, back, chest, or stomach pain, severe headache Kidney injury (glomerulonephritis)--decrease in the amount of urine, red or dark brown urine, foamy or bubbly urine, swelling of the ankles, hands, or feet Shortness of breath or trouble breathing Spleen injury--pain in upper left stomach or shoulder Unusual bruising or bleeding Side effects that usually do not require medical attention (report to your care team if they continue or are bothersome): Bone pain Pain in the hands or feet This list may not describe all possible side effects. Call your doctor for medical advice about side effects. You may report side effects to FDA at 1-800-FDA-1088. Where should I keep my medication? Keep out of the reach of children. If you are using this medication at home, you will be instructed on how to store it. Throw away any unused medication after the expiration date on the label. NOTE: This sheet is a summary. It may not cover all possible information. If you have questions about this medicine, talk to your doctor, pharmacist, or health care provider.  2023  Elsevier/Gold Standard (2020-12-08 00:00:00)       To help prevent nausea and vomiting after your treatment, we encourage you to take your nausea medication as directed.  BELOW ARE SYMPTOMS THAT SHOULD BE REPORTED IMMEDIATELY: *FEVER GREATER THAN 100.4 F (38 C) OR HIGHER *CHILLS OR SWEATING *NAUSEA AND VOMITING THAT IS NOT CONTROLLED WITH YOUR NAUSEA MEDICATION *UNUSUAL SHORTNESS OF BREATH *UNUSUAL BRUISING OR BLEEDING *URINARY PROBLEMS (pain or burning when urinating, or frequent urination) *BOWEL PROBLEMS (unusual diarrhea, constipation, pain near the anus) TENDERNESS IN MOUTH AND THROAT WITH OR WITHOUT PRESENCE OF ULCERS (sore throat, sores in mouth, or a toothache) UNUSUAL RASH, SWELLING OR PAIN  UNUSUAL VAGINAL DISCHARGE OR ITCHING   Items with * indicate a potential emergency and should be followed up as soon as possible or go to the Emergency Department if any problems should occur.  Please show the CHEMOTHERAPY ALERT CARD or IMMUNOTHERAPY ALERT CARD at check-in to the Emergency Department and triage nurse.  Should you have questions after your visit or need to cancel or reschedule your appointment, please contact [facility] [phone]  and follow the prompts.  Office hours are 8:00 a.m. to 4:30 p.m. Monday - Friday. Please note that voicemails left after 4:00 p.m. may not be returned until the following business day.  We are closed weekends and major holidays.  You have access to a nurse at all times for urgent questions. Please call the main number to the clinic [phone] and follow the prompts.  For any non-urgent questions, you may also contact your provider using MyChart. We now offer e-Visits for anyone 92 and older to request care online for non-urgent symptoms. For details visit [email or web address].   Also download the MyChart app! Go to the app store, search "MyChart", open the app, select [facility], and log in with your MyChart username and  password.

## 2022-07-18 NOTE — Progress Notes (Signed)
Nicole Santos presents today for injection per the provider's orders.  Neulasta injection administration without incident; injection site WNL; see MAR for injection details.  Patient tolerated procedure well and without incident.  No questions or complaints noted at this time. Discharged from clinic ambulatory in stable condition. Alert and oriented x 3. F/U with [facility] as scheduled.

## 2022-08-06 NOTE — Progress Notes (Signed)
[facility] [address] Nicole Santos    Clinic Day:  08/07/2022  Referring physician: Janora Norlander, DO  Patient Care Team: Janora Norlander, DO as PCP - General (Family Medicine) Harlen Labs, MD as Referring Physician (Optometry) Derek Jack, MD as Medical Oncologist (Medical Oncology) Brien Mates, RN as Oncology Nurse Navigator (Medical Oncology)   ASSESSMENT & PLAN:   Assessment: High-grade serous carcinoma of peritoneum: - She had developed sharp left-sided abdominal pain since Mother's Day, occasional radiating to the back.  40 pound weight loss in the last 6 months. - Personal history of cervical cancer in 2000, status post chemo plus XRT at [facility].  She has slightly decreased hearing in the left ear since then. - US abdomen (11/08/2021): Cholelithiasis with gallbladder wall thickening and sonographic Murphy sign.  Upper abdominal ascites. - Laparoscopy (11/13/2021): Starting of the omentum as well as the upper abdominal wall and diaphragm.  Liver was within normal limits.  6 L of ascites was evacuated.  Omental and peritoneal biopsies were sent.  Peritoneal implants throughout and a significant portion of omentum and on the small intestine. - CT CAP (11/13/2021): Ascites, extensive peritoneal thickening, nodularity and omental caking.  No evidence of primary malignancy in the chest, abdomen or pelvis.  Small left pleural effusion, nonspecific.  Cholelithiasis. - US pelvis (11/14/2021): Uterus is grossly unremarkable.  Ovaries not visualized. - CA125 (11/13/2021): 1269 - Pathology: Peritoneum and omental biopsy consistent with high-grade serous carcinoma.  HER2 2+ by IHC and negative by FISH. - 4 cycles of carboplatin and paclitaxel from 11/21/2021 through 01/23/2022    Social/family history: - Lives at home with her husband.  Worked in Scientist, research (medical) at [employer].  Non-smoker. - Maternal grandmother with bladder cancer.  Maternal  great grandmother with breast cancer.  Plan: High-grade serous carcinoma of peritoneum: - She has tolerated last cycle of chemotherapy very well. - She did not have any GI side effects. - Reviewed labs today which showed normal LFTs with albumin 3.3.  CBC was grossly normal.  Last CA125 was 11.6. - Proceed with cycle 8 today with no dose modifications.  RTC 3 weeks for follow-up with repeat CT scan. - She has an appointment to follow-up with Dr. Berline Lopes on 08/31/2022.     2.  Peripheral neuropathy: - Continue Cymbalta 60 mg daily.  Neuropathy stable.  3.  Diarrhea: - Continue Lomotil as needed for diarrhea.  4.  Hypomagnesemia: - Continue magnesium twice daily.  She will receive IV magnesium today.   5.  Hypokalemia: - Continue potassium 20 mEq daily.  Potassium is normal.   6.  Subsegmental pulmonary embolism (Dx 03/06/2022): - Continue Eliquis twice daily.  No bleeding issues.  Orders Placed This Encounter  Procedures   Magnesium    Standing Status:   Future    Standing Expiration Date:   08/28/2023   CBC with Differential    Standing Status:   Future    Standing Expiration Date:   08/28/2023   Comprehensive metabolic panel    Standing Status:   Future    Standing Expiration Date:   08/28/2023     I,Alexis Herring,acting as a scribe for Derek Jack, MD.,have documented all relevant documentation on the behalf of Derek Jack, MD,as directed by  Derek Jack, MD while in the presence of Derek Jack, MD.  I, Derek Jack MD, have reviewed the above documentation for accuracy and completeness, and I agree with the above.  Derek Jack, MD   3/5/20245:57 PM  CHIEF COMPLAINT:   Diagnosis: high-grade serous carcinoma of peritoneum    Cancer Staging  Primary peritoneal carcinomatosis [facility]) Staging form: Ovary, Fallopian Tube, and Primary Peritoneal Carcinoma, AJCC 8th Edition - Clinical stage from 11/17/2021: FIGO Stage IIIC  (cT3c, cM0) - Unsigned    Prior Therapy: Carboplatin and paclitaxel x 4 cycles, completed on 01/23/2022   Current Therapy:  Carboplatin (AUC 6) + Paclitaxel   HISTORY OF PRESENT ILLNESS:   Oncology History  Primary peritoneal carcinomatosis ([place])  11/17/2021 Initial Diagnosis   Primary peritoneal carcinomatosis ([place])   11/21/2021 - 01/25/2022 Chemotherapy   Patient is on Treatment Plan : OVARIAN Carboplatin (AUC 6) / Paclitaxel (175) q21d x 6 cycles     11/21/2021 -  Chemotherapy   Patient is on Treatment Plan : OVARIAN Carboplatin (AUC 6) + Paclitaxel (175) q21d X 6 Cycles        INTERVAL HISTORY:   Nicole Santos is a 52 y.o. female presenting to clinic today for follow up of high-grade serous carcinoma of peritoneum. She was last seen by me on 07/16/22.  Today, she states that she is doing well overall. Her appetite level is at 100%. Her energy level is at 100%.  She reports her neuropathy symptoms are stable.  Occasional diarrhea well-controlled with Lomotil.    PAST MEDICAL HISTORY:   Past Medical History: Past Medical History:  Diagnosis Date   Acute respiratory disease due to COVID-19 virus 02/02/2019   Anxiety    Cancer ([place])    Depression    Dyspnea    Dysrhythmia    History of diabetes mellitus    Resolved in Sept 2023 with weight loss.   Hypertension    Ovarian cancer ([place]) 11/14/2021   Peritoneal carcinomatosis ([place])    Pneumonia    Sleep apnea    doesn't use CPAP   Tachycardia     Surgical History: Past Surgical History:  Procedure Laterality Date   KNEE SURGERY Right    Arthroscopic   LAPAROSCOPIC ABDOMINAL EXPLORATION N/A 11/13/2021   Procedure: Exploratory laparoscopy;  Surgeon: Aviva Signs, MD;  Location: [facility];  Service: General;  Laterality: N/A;   PORTACATH PLACEMENT Left 11/15/2021   Procedure: INSERTION PORT-A-CATH;  Surgeon: Aviva Signs, MD;  Location: [facility];  Service: General;  Laterality: Left;   TONSILLECTOMY AND ADENOIDECTOMY      Social  History: Social History   Socioeconomic History   Marital status: Married    Spouse name: Not on file   Number of children: Not on file   Years of education: Not on file   Highest education level: Not on file  Occupational History   Occupation: Unemployed  Tobacco Use   Smoking status: Never   Smokeless tobacco: Never  Vaping Use   Vaping Use: Never used  Substance and Sexual Activity   Alcohol use: No   Drug use: No   Sexual activity: Not Currently  Other Topics Concern   Not on file  Social History Narrative   Not on file   Social Determinants of Health   Financial Resource Strain: Not on file  Food Insecurity: No Food Insecurity (03/06/2022)   Hunger Vital Sign    Worried About Running Out of Food in the Last Year: Never true    Ran Out of Food in the Last Year: Never true  Transportation Needs: No Transportation Needs (03/06/2022)   PRAPARE - Hydrologist (Medical): No  Lack of Transportation (Non-Medical): No  Physical Activity: Not on file  Stress: Not on file  Social Connections: Not on file  Intimate Partner Violence: Not At Risk (03/06/2022)   Humiliation, Afraid, Rape, and Kick questionnaire    Fear of Current or Ex-Partner: No    Emotionally Abused: No    Physically Abused: No    Sexually Abused: No    Family History: Family History  Problem Relation Age of Onset   Hypertension Mother    Bladder Cancer Maternal Grandmother    Breast cancer Maternal Great-grandmother    Colon cancer Neg Hx    Ovarian cancer Neg Hx    Endometrial cancer Neg Hx    Pancreatic cancer Neg Hx    Prostate cancer Neg Hx     Current Medications:  Current Outpatient Medications:    apixaban (ELIQUIS) 5 MG TABS tablet, Take 2 tablets (10 mg total) by mouth 2 (two) times daily. Then on 03/14/22, take 1 tab (5 mg) two times daily., Disp: 74 tablet, Rfl: 1   atorvastatin (LIPITOR) 20 MG tablet, Take 1 tablet (20 mg total) by mouth daily. (Patient  taking differently: Take 20 mg by mouth at bedtime.), Disp: 90 tablet, Rfl: 1   diphenoxylate-atropine (LOMOTIL) 2.5-0.025 MG tablet, Take 2 tablets by mouth after the first watery bowel movement. May then take 1 pill by mouth after each watery bowel movement. Do not exceed 8 pills in a 24 hour period., Disp: 60 tablet, Rfl: 3   DULoxetine (CYMBALTA) 30 MG capsule, Take 1 capsule (30 mg total) by mouth daily. Take 1 capsule by mouth x 7 days, then increase to 2 capsules daily, Disp: 90 capsule, Rfl: 3   magnesium oxide (MAG-OX) 400 (240 Mg) MG tablet, TAKE 1 TABLET BY MOUTH TWICE A DAY, Disp: 60 tablet, Rfl: 2   Multiple Vitamin (MULTIVITAMIN) capsule, Take 1 capsule by mouth daily., Disp: , Rfl:    Potassium 99 MG TABS, Take 99 mg by mouth at bedtime., Disp: , Rfl:  No current facility-administered medications for this visit.  Facility-Administered Medications Ordered in Other Visits:    heparin lock flush 100 unit/mL, 500 Units, Intravenous, Once, Derek Jack, MD   sodium chloride flush (NS) 0.9 % injection 10 mL, 10 mL, Intravenous, PRN, Derek Jack, MD   sodium chloride flush (NS) 0.9 % injection 10 mL, 10 mL, Intracatheter, PRN, Derek Jack, MD, 10 mL at 08/07/22 1608   Allergies: Allergies  Allergen Reactions   Augmentin [Amoxicillin-Pot Clavulanate] Diarrhea and Nausea And Vomiting   Doxycycline     Abdominal cramps.   Topamax [Topiramate] Nausea And Vomiting    REVIEW OF SYSTEMS:   Review of Systems  Constitutional:  Negative for chills, fatigue and fever.  HENT:   Negative for lump/mass, mouth sores, nosebleeds, sore throat and trouble swallowing.   Eyes:  Negative for eye problems.  Respiratory:  Negative for cough and shortness of breath.   Cardiovascular:  Negative for chest pain, leg swelling and palpitations.  Gastrointestinal:  Negative for abdominal pain, constipation, diarrhea, nausea and vomiting.  Genitourinary:  Negative for bladder  incontinence, difficulty urinating, dysuria, frequency, hematuria and nocturia.   Musculoskeletal:  Negative for arthralgias, back pain, flank pain, myalgias and neck pain.  Skin:  Negative for itching and rash.  Neurological:  Negative for dizziness, headaches and numbness.  Hematological:  Does not bruise/bleed easily.  Psychiatric/Behavioral:  Negative for depression, sleep disturbance and suicidal ideas. The patient is not nervous/anxious.   All other  systems reviewed and are negative.    VITALS:   There were no vitals taken for this visit.  Wt Readings from Last 3 Encounters:  08/07/22 191 lb 6.4 oz (86.8 kg)  07/16/22 187 lb 14.4 oz (85.2 kg)  06/26/22 189 lb 14.4 oz (86.1 kg)    There is no height or weight on file to calculate BMI.  Performance status (ECOG): 1 - Symptomatic but completely ambulatory  PHYSICAL EXAM:   Physical Exam Vitals and nursing note reviewed. Exam conducted with a chaperone present.  Constitutional:      Appearance: Normal appearance.  Cardiovascular:     Rate and Rhythm: Normal rate and regular rhythm.     Pulses: Normal pulses.     Heart sounds: Normal heart sounds.  Pulmonary:     Effort: Pulmonary effort is normal.     Breath sounds: Normal breath sounds.  Abdominal:     Palpations: Abdomen is soft. There is no hepatomegaly, splenomegaly or mass.     Tenderness: There is no abdominal tenderness.  Musculoskeletal:     Right lower leg: No edema.     Left lower leg: No edema.  Lymphadenopathy:     Cervical: No cervical adenopathy.     Right cervical: No superficial, deep or posterior cervical adenopathy.    Left cervical: No superficial, deep or posterior cervical adenopathy.     Upper Body:     Right upper body: No supraclavicular or axillary adenopathy.     Left upper body: No supraclavicular or axillary adenopathy.  Neurological:     General: No focal deficit present.     Mental Status: She is alert and oriented to person, place, and  time.  Psychiatric:        Mood and Affect: Mood normal.        Behavior: Behavior normal.     LABS:      Latest Ref Rng & Units 08/07/2022    8:13 AM 07/16/2022    8:19 AM 06/26/2022    8:27 AM  CBC  WBC 4.0 - 10.5 K/uL 4.3  4.1  4.5   Hemoglobin 12.0 - 15.0 g/dL 10.3  10.1  10.8   Hematocrit 36.0 - 46.0 % 31.6  30.7  32.1   Platelets 150 - 400 K/uL 188  203  198       Latest Ref Rng & Units 08/07/2022    8:13 AM 07/16/2022    8:19 AM 06/26/2022    8:27 AM  CMP  Glucose 70 - 99 mg/dL 117  118  111   BUN 6 - 20 mg/dL '8  11  14   '$ Creatinine 0.44 - 1.00 mg/dL 0.53  0.59  0.59   Sodium 135 - 145 mmol/L 137  140  137   Potassium 3.5 - 5.1 mmol/L 3.9  4.1  3.5   Chloride 98 - 111 mmol/L 105  105  104   CO2 22 - 32 mmol/L '25  27  26   '$ Calcium 8.9 - 10.3 mg/dL 8.6  8.7  8.5   Total Protein 6.5 - 8.1 g/dL 6.4  6.4  6.3   Total Bilirubin 0.3 - 1.2 mg/dL 0.8  0.7  0.6   Alkaline Phos 38 - 126 U/L 63  64  62   AST 15 - 41 U/L '20  17  17   '$ ALT 0 - 44 U/L '16  13  16      '$ Lab Results  Component Value Date  CEA1 1.1 11/13/2021   /  CEA  Date Value Ref Range Status  11/13/2021 1.1 0.0 - 4.7 ng/mL Final    Comment:    (NOTE)                             Nonsmokers          <3.9                             Smokers             <5.6 Roche [employer] Electrochemiluminescence Immunoassay (ECLIA) Values obtained with different assay methods or kits cannot be used interchangeably.  Results cannot be interpreted as absolute evidence of the presence or absence of malignant disease. Performed At: [facility] [place], [place] [id number] Rush Farmer MD [id number]    No results found for: "PSA1" Lab Results  Component Value Date   CAN199 6 11/13/2021   Lab Results  Component Value Date   CAN125 11.6 07/16/2022    No results found for: "TOTALPROTELP", "ALBUMINELP", "A1GS", "A2GS", "BETS", "BETA2SER", "GAMS", "MSPIKE", "SPEI" Lab Results  Component  Value Date   FERRITIN 356 (H) 02/07/2019   FERRITIN 311 (H) 02/06/2019   FERRITIN 343 (H) 02/05/2019   Lab Results  Component Value Date   LDH 259 (H) 02/02/2019     STUDIES:   No results found.

## 2022-08-07 ENCOUNTER — Inpatient Hospital Stay: Payer: Commercial Managed Care - PPO | Attending: Gynecologic Oncology

## 2022-08-07 ENCOUNTER — Encounter: Payer: Self-pay | Admitting: Hematology

## 2022-08-07 ENCOUNTER — Inpatient Hospital Stay (HOSPITAL_BASED_OUTPATIENT_CLINIC_OR_DEPARTMENT_OTHER): Payer: Commercial Managed Care - PPO | Admitting: Hematology

## 2022-08-07 ENCOUNTER — Inpatient Hospital Stay: Payer: Commercial Managed Care - PPO

## 2022-08-07 DIAGNOSIS — C482 Malignant neoplasm of peritoneum, unspecified: Secondary | ICD-10-CM

## 2022-08-07 DIAGNOSIS — E876 Hypokalemia: Secondary | ICD-10-CM | POA: Diagnosis not present

## 2022-08-07 DIAGNOSIS — Z5111 Encounter for antineoplastic chemotherapy: Secondary | ICD-10-CM | POA: Diagnosis present

## 2022-08-07 DIAGNOSIS — Z5189 Encounter for other specified aftercare: Secondary | ICD-10-CM | POA: Diagnosis not present

## 2022-08-07 LAB — COMPREHENSIVE METABOLIC PANEL
ALT: 16 U/L (ref 0–44)
AST: 20 U/L (ref 15–41)
Albumin: 3.3 g/dL — ABNORMAL LOW (ref 3.5–5.0)
Alkaline Phosphatase: 63 U/L (ref 38–126)
Anion gap: 7 (ref 5–15)
BUN: 8 mg/dL (ref 6–20)
CO2: 25 mmol/L (ref 22–32)
Calcium: 8.6 mg/dL — ABNORMAL LOW (ref 8.9–10.3)
Chloride: 105 mmol/L (ref 98–111)
Creatinine, Ser: 0.53 mg/dL (ref 0.44–1.00)
GFR, Estimated: >60 mL/min (ref >60)
Glucose, Bld: 117 mg/dL — ABNORMAL HIGH (ref 70–99)
Potassium: 3.9 mmol/L (ref 3.5–5.1)
Sodium: 137 mmol/L (ref 135–145)
Total Bilirubin: 0.8 mg/dL (ref 0.3–1.2)
Total Protein: 6.4 g/dL — ABNORMAL LOW (ref 6.5–8.1)

## 2022-08-07 LAB — CBC WITH DIFFERENTIAL/PLATELET
Abs Immature Granulocytes: 0.02 10*3/uL (ref 0.00–0.07)
Basophils Absolute: 0 10*3/uL (ref 0.0–0.1)
Basophils Relative: 1 %
Eosinophils Absolute: 0.1 10*3/uL (ref 0.0–0.5)
Eosinophils Relative: 2 %
HCT: 31.6 % — ABNORMAL LOW (ref 36.0–46.0)
Hemoglobin: 10.3 g/dL — ABNORMAL LOW (ref 12.0–15.0)
Immature Granulocytes: 1 %
Lymphocytes Relative: 27 %
Lymphs Abs: 1.2 10*3/uL (ref 0.7–4.0)
MCH: 33.2 pg (ref 26.0–34.0)
MCHC: 32.6 g/dL (ref 30.0–36.0)
MCV: 101.9 fL — ABNORMAL HIGH (ref 80.0–100.0)
Monocytes Absolute: 0.5 10*3/uL (ref 0.1–1.0)
Monocytes Relative: 12 %
Neutro Abs: 2.5 10*3/uL (ref 1.7–7.7)
Neutrophils Relative %: 57 %
Platelets: 188 10*3/uL (ref 150–400)
RBC: 3.1 MIL/uL — ABNORMAL LOW (ref 3.87–5.11)
RDW: 16.1 % — ABNORMAL HIGH (ref 11.5–15.5)
WBC: 4.3 10*3/uL (ref 4.0–10.5)
nRBC: 0 % (ref 0.0–0.2)

## 2022-08-07 LAB — MAGNESIUM: Magnesium: 1.6 mg/dL — ABNORMAL LOW (ref 1.7–2.4)

## 2022-08-07 MED ORDER — SODIUM CHLORIDE 0.9 % IV SOLN
150.0000 mg | Freq: Once | INTRAVENOUS | Status: AC
  Administered 2022-08-07: 150 mg via INTRAVENOUS
  Filled 2022-08-07: qty 150

## 2022-08-07 MED ORDER — FAMOTIDINE IN NACL 20-0.9 MG/50ML-% IV SOLN
20.0000 mg | Freq: Once | INTRAVENOUS | Status: AC
  Administered 2022-08-07: 20 mg via INTRAVENOUS
  Filled 2022-08-07: qty 50

## 2022-08-07 MED ORDER — PALONOSETRON HCL INJECTION 0.25 MG/5ML
0.2500 mg | Freq: Once | INTRAVENOUS | Status: AC
  Administered 2022-08-07: 0.25 mg via INTRAVENOUS
  Filled 2022-08-07: qty 5

## 2022-08-07 MED ORDER — SODIUM CHLORIDE 0.9 % IV SOLN
140.0000 mg/m2 | Freq: Once | INTRAVENOUS | Status: AC
  Administered 2022-08-07: 276 mg via INTRAVENOUS
  Filled 2022-08-07: qty 46

## 2022-08-07 MED ORDER — SODIUM CHLORIDE 0.9% FLUSH
10.0000 mL | INTRAVENOUS | Status: DC | PRN
  Administered 2022-08-07: 10 mL

## 2022-08-07 MED ORDER — MAGNESIUM SULFATE 2 GM/50ML IV SOLN
2.0000 g | Freq: Once | INTRAVENOUS | Status: AC
  Administered 2022-08-07: 2 g via INTRAVENOUS
  Filled 2022-08-07: qty 50

## 2022-08-07 MED ORDER — SODIUM CHLORIDE 0.9 % IV SOLN
10.0000 mg | Freq: Once | INTRAVENOUS | Status: AC
  Administered 2022-08-07: 10 mg via INTRAVENOUS
  Filled 2022-08-07: qty 10

## 2022-08-07 MED ORDER — HEPARIN SOD (PORK) LOCK FLUSH 100 UNIT/ML IV SOLN
500.0000 [IU] | Freq: Once | INTRAVENOUS | Status: AC | PRN
  Administered 2022-08-07: 500 [IU]

## 2022-08-07 MED ORDER — SODIUM CHLORIDE 0.9 % IV SOLN
720.0000 mg | Freq: Once | INTRAVENOUS | Status: AC
  Administered 2022-08-07: 700 mg via INTRAVENOUS
  Filled 2022-08-07: qty 70

## 2022-08-07 MED ORDER — CETIRIZINE HCL 10 MG/ML IV SOLN
10.0000 mg | Freq: Once | INTRAVENOUS | Status: AC
  Administered 2022-08-07: 10 mg via INTRAVENOUS
  Filled 2022-08-07: qty 1

## 2022-08-07 MED ORDER — SODIUM CHLORIDE 0.9 % IV SOLN: Freq: Once | INTRAVENOUS | Status: AC

## 2022-08-07 NOTE — Patient Instructions (Signed)
[facility]  Discharge Instructions: Thank you for choosing [facility] to provide your oncology and hematology care.  If you have a lab appointment with the [facility], please come in thru the Main Entrance and check in at the main information desk.  Wear comfortable clothing and clothing appropriate for easy access to any Portacath or PICC line.   We strive to give you quality time with your provider. You may need to reschedule your appointment if you arrive late (15 or more minutes).  Arriving late affects you and other patients whose appointments are after yours.  Also, if you miss three or more appointments without notifying the office, you may be dismissed from the clinic at the provider's discretion.      For prescription refill requests, have your pharmacy contact our office and allow 72 hours for refills to be completed.    Today you received the following chemotherapy and/or immunotherapy agents Taxol/Carboplatin.  Paclitaxel Injection What is this medication? PACLITAXEL (PAK li TAX el) treats some types of cancer. It works by slowing down the growth of cancer cells. This medicine may be used for other purposes; ask your health care provider or pharmacist if you have questions. COMMON BRAND NAME(S): Onxol, Taxol What should I tell my care team before I take this medication? They need to know if you have any of these conditions: Heart disease Liver disease Low white blood cell levels An unusual or allergic reaction to paclitaxel, other medications, foods, dyes, or preservatives If you or your partner are pregnant or trying to get pregnant Breast-feeding How should I use this medication? This medication is injected into a vein. It is given by your care team in a hospital or clinic setting. Talk to your care team about the use of this medication in children. While it may be given to children for selected conditions, precautions do  apply. Overdosage: If you think you have taken too much of this medicine contact a poison control center or emergency room at once. NOTE: This medicine is only for you. Do not share this medicine with others. What if I miss a dose? Keep appointments for follow-up doses. It is important not to miss your dose. Call your care team if you are unable to keep an appointment. What may interact with this medication? Do not take this medication with any of the following: Live virus vaccines Other medications may affect the way this medication works. Talk with your care team about all of the medications you take. They may suggest changes to your treatment plan to lower the risk of side effects and to make sure your medications work as intended. This list may not describe all possible interactions. Give your health care provider a list of all the medicines, herbs, non-prescription drugs, or dietary supplements you use. Also tell them if you smoke, drink alcohol, or use illegal drugs. Some items may interact with your medicine. What should I watch for while using this medication? Your condition will be monitored carefully while you are receiving this medication. You may need blood work while taking this medication. This medication may make you feel generally unwell. This is not uncommon as chemotherapy can affect healthy cells as well as cancer cells. Report any side effects. Continue your course of treatment even though you feel ill unless your care team tells you to stop. This medication can cause serious allergic reactions. To reduce the risk, your care team may give you other medications to  take before receiving this one. Be sure to follow the directions from your care team. This medication may increase your risk of getting an infection. Call your care team for advice if you get a fever, chills, sore throat, or other symptoms of a cold or flu. Do not treat yourself. Try to avoid being around people who are  sick. This medication may increase your risk to bruise or bleed. Call your care team if you notice any unusual bleeding. Be careful brushing or flossing your teeth or using a toothpick because you may get an infection or bleed more easily. If you have any dental work done, tell your dentist you are receiving this medication. Talk to your care team if you may be pregnant. Serious birth defects can occur if you take this medication during pregnancy. Talk to your care team before breastfeeding. Changes to your treatment plan may be needed. What side effects may I notice from receiving this medication? Side effects that you should report to your care team as soon as possible: Allergic reactions--skin rash, itching, hives, swelling of the face, lips, tongue, or throat Heart rhythm changes--fast or irregular heartbeat, dizziness, feeling faint or lightheaded, chest pain, trouble breathing Increase in blood pressure Infection--fever, chills, cough, sore throat, wounds that don't heal, pain or trouble when passing urine, general feeling of discomfort or being unwell Low blood pressure--dizziness, feeling faint or lightheaded, blurry vision Low red blood cell level--unusual weakness or fatigue, dizziness, headache, trouble breathing Painful swelling, warmth, or redness of the skin, blisters or sores at the infusion site Pain, tingling, or numbness in the hands or feet Slow heartbeat--dizziness, feeling faint or lightheaded, confusion, trouble breathing, unusual weakness or fatigue Unusual bruising or bleeding Side effects that usually do not require medical attention (report to your care team if they continue or are bothersome): Diarrhea Hair loss Joint pain Loss of appetite Muscle pain Nausea Vomiting This list may not describe all possible side effects. Call your doctor for medical advice about side effects. You may report side effects to FDA at 1-800-FDA-1088. Where should I keep my  medication? This medication is given in a hospital or clinic. It will not be stored at home. NOTE: This sheet is a summary. It may not cover all possible information. If you have questions about this medicine, talk to your doctor, pharmacist, or health care provider.  2023 Elsevier/Gold Standard (2021-10-05 00:00:00)    Carboplatin Injection What is this medication? CARBOPLATIN (KAR boe pla tin) treats some types of cancer. It works by slowing down the growth of cancer cells. This medicine may be used for other purposes; ask your health care provider or pharmacist if you have questions. COMMON BRAND NAME(S): Paraplatin What should I tell my care team before I take this medication? They need to know if you have any of these conditions: Blood disorders Hearing problems Kidney disease Recent or ongoing radiation therapy An unusual or allergic reaction to carboplatin, cisplatin, other medications, foods, dyes, or preservatives Pregnant or trying to get pregnant Breast-feeding How should I use this medication? This medication is injected into a vein. It is given by your care team in a hospital or clinic setting. Talk to your care team about the use of this medication in children. Special care may be needed. Overdosage: If you think you have taken too much of this medicine contact a poison control center or emergency room at once. NOTE: This medicine is only for you. Do not share this medicine with others. What if  I miss a dose? Keep appointments for follow-up doses. It is important not to miss your dose. Call your care team if you are unable to keep an appointment. What may interact with this medication? Medications for seizures Some antibiotics, such as amikacin, gentamicin, neomycin, streptomycin, tobramycin Vaccines This list may not describe all possible interactions. Give your health care provider a list of all the medicines, herbs, non-prescription drugs, or dietary supplements you  use. Also tell them if you smoke, drink alcohol, or use illegal drugs. Some items may interact with your medicine. What should I watch for while using this medication? Your condition will be monitored carefully while you are receiving this medication. You may need blood work while taking this medication. This medication may make you feel generally unwell. This is not uncommon, as chemotherapy can affect healthy cells as well as cancer cells. Report any side effects. Continue your course of treatment even though you feel ill unless your care team tells you to stop. In some cases, you may be given additional medications to help with side effects. Follow all directions for their use. This medication may increase your risk of getting an infection. Call your care team for advice if you get a fever, chills, sore throat, or other symptoms of a cold or flu. Do not treat yourself. Try to avoid being around people who are sick. Avoid taking medications that contain aspirin, acetaminophen, ibuprofen, naproxen, or ketoprofen unless instructed by your care team. These medications may hide a fever. Be careful brushing or flossing your teeth or using a toothpick because you may get an infection or bleed more easily. If you have any dental work done, tell your dentist you are receiving this medication. Talk to your care team if you wish to become pregnant or think you might be pregnant. This medication can cause serious birth defects. Talk to your care team about effective forms of contraception. Do not breast-feed while taking this medication. What side effects may I notice from receiving this medication? Side effects that you should report to your care team as soon as possible: Allergic reactions--skin rash, itching, hives, swelling of the face, lips, tongue, or throat Infection--fever, chills, cough, sore throat, wounds that don't heal, pain or trouble when passing urine, general feeling of discomfort or being  unwell Low red blood cell level--unusual weakness or fatigue, dizziness, headache, trouble breathing Pain, tingling, or numbness in the hands or feet, muscle weakness, change in vision, confusion or trouble speaking, loss of balance or coordination, trouble walking, seizures Unusual bruising or bleeding Side effects that usually do not require medical attention (report to your care team if they continue or are bothersome): Hair loss Nausea Unusual weakness or fatigue Vomiting This list may not describe all possible side effects. Call your doctor for medical advice about side effects. You may report side effects to FDA at 1-800-FDA-1088. Where should I keep my medication? This medication is given in a hospital or clinic. It will not be stored at home. NOTE: This sheet is a summary. It may not cover all possible information. If you have questions about this medicine, talk to your doctor, pharmacist, or health care provider.  2023 Elsevier/Gold Standard (2007-07-12 00:00:00)        To help prevent nausea and vomiting after your treatment, we encourage you to take your nausea medication as directed.  BELOW ARE SYMPTOMS THAT SHOULD BE REPORTED IMMEDIATELY: *FEVER GREATER THAN 100.4 F (38 C) OR HIGHER *CHILLS OR SWEATING *NAUSEA AND VOMITING THAT  IS NOT CONTROLLED WITH YOUR NAUSEA MEDICATION *UNUSUAL SHORTNESS OF BREATH *UNUSUAL BRUISING OR BLEEDING *URINARY PROBLEMS (pain or burning when urinating, or frequent urination) *BOWEL PROBLEMS (unusual diarrhea, constipation, pain near the anus) TENDERNESS IN MOUTH AND THROAT WITH OR WITHOUT PRESENCE OF ULCERS (sore throat, sores in mouth, or a toothache) UNUSUAL RASH, SWELLING OR PAIN  UNUSUAL VAGINAL DISCHARGE OR ITCHING   Items with * indicate a potential emergency and should be followed up as soon as possible or go to the Emergency Department if any problems should occur.  Please show the CHEMOTHERAPY ALERT CARD or IMMUNOTHERAPY ALERT CARD  at check-in to the Emergency Department and triage nurse.  Should you have questions after your visit or need to cancel or reschedule your appointment, please contact [facility] [phone]  and follow the prompts.  Office hours are 8:00 a.m. to 4:30 p.m. Monday - Friday. Please note that voicemails left after 4:00 p.m. may not be returned until the following business day.  We are closed weekends and major holidays. You have access to a nurse at all times for urgent questions. Please call the main number to the clinic [phone] and follow the prompts.  For any non-urgent questions, you may also contact your provider using MyChart. We now offer e-Visits for anyone 16 and older to request care online for non-urgent symptoms. For details visit [email or web address].   Also download the MyChart app! Go to the app store, search "MyChart", open the app, select [facility], and log in with your MyChart username and password.

## 2022-08-07 NOTE — Progress Notes (Signed)
Patient has been examined by Dr. Delton Coombes. Vital signs and labs have been reviewed by MD - ANC, Creatinine, LFTs, hemoglobin, and platelets are within treatment parameters per M.D. - pt may proceed with treatment.  Primary RN and pharmacy notified.

## 2022-08-07 NOTE — Progress Notes (Signed)
Patient presents today for chemotherapy infusion.  Patient is in satisfactory condition with no new complaints voiced.  Vital signs are stable.  Labs reviewed by Dr. Delton Coombes during her office visit.  All labs are within treatment parameters.  Magnesium today is 1.6.  We will give Magnesium 2 grams IV x one dose today per Dr. Delton Coombes.  We will proceed with treatment per MD orders.   Patient tolerated treatment well with no complaints voiced.  Patient left ambulatory in stable condition.  Vital signs stable at discharge.  Follow up as scheduled.

## 2022-08-07 NOTE — Patient Instructions (Addendum)
[facility] at [facility] Discharge Instructions   You were seen and examined today by Dr. Delton Coombes.  He reviewed the results of your lab work which are normal/stable. Your magnesium is slightly low at 1.6. We will give IV magnesium in the clinic today.   We will repeat a CT scan after this treatment.   Return as scheduled.    Thank you for choosing [facility] at [facility] to provide your oncology and hematology care.  To afford each patient quality time with our provider, please arrive at least 15 minutes before your scheduled appointment time.   If you have a lab appointment with the [facility] please come in thru the Main Entrance and check in at the main information desk.  You need to re-schedule your appointment should you arrive 10 or more minutes late.  We strive to give you quality time with our providers, and arriving late affects you and other patients whose appointments are after yours.  Also, if you no show three or more times for appointments you may be dismissed from the clinic at the providers discretion.     Again, thank you for choosing [facility].  Our hope is that these requests will decrease the amount of time that you wait before being seen by our physicians.       _____________________________________________________________  Should you have questions after your visit to [facility], please contact our office at [phone] and follow the prompts.  Our office hours are 8:00 a.m. and 4:30 p.m. Monday - Friday.  Please note that voicemails left after 4:00 p.m. may not be returned until the following business day.  We are closed weekends and major holidays.  You do have access to a nurse 24-7, just call the main number to the clinic [phone] and do not press any options, hold on the line and a nurse will answer the phone.    For prescription refill requests, have your pharmacy contact our  office and allow 72 hours.    Due to Covid, you will need to wear a mask upon entering the hospital. If you do not have a mask, a mask will be given to you at the Main Entrance upon arrival. For doctor visits, patients may have 1 support person age 96 or older with them. For treatment visits, patients can not have anyone with them due to social distancing guidelines and our immunocompromised population.

## 2022-08-09 ENCOUNTER — Inpatient Hospital Stay: Payer: Commercial Managed Care - PPO

## 2022-08-09 VITALS — BP 152/100 | HR 114 | Temp 96.2°F | Resp 20

## 2022-08-09 DIAGNOSIS — C482 Malignant neoplasm of peritoneum, unspecified: Secondary | ICD-10-CM

## 2022-08-09 DIAGNOSIS — Z5111 Encounter for antineoplastic chemotherapy: Secondary | ICD-10-CM | POA: Diagnosis not present

## 2022-08-09 DIAGNOSIS — R112 Nausea with vomiting, unspecified: Secondary | ICD-10-CM

## 2022-08-09 MED ORDER — PROCHLORPERAZINE MALEATE 10 MG PO TABS: 10.0000 mg | ORAL_TABLET | Freq: Four times a day (QID) | ORAL | 3 refills | Status: DC | PRN

## 2022-08-09 MED ORDER — PEGFILGRASTIM INJECTION 6 MG/0.6ML ~~LOC~~
6.0000 mg | PREFILLED_SYRINGE | Freq: Once | SUBCUTANEOUS | Status: AC
  Administered 2022-08-09: 6 mg via SUBCUTANEOUS
  Filled 2022-08-09: qty 0.6

## 2022-08-09 NOTE — Progress Notes (Signed)
Patient presented today for growth factor injection. Patient nauseous, and actively throwing up. Offered doctor visit and possibly fluids with antiemetics. Patient declined, said nausea was not related to chemo treatment. Sent nausea medication to patients pharmacy. Patient to call if she needs to be seen.

## 2022-08-13 ENCOUNTER — Encounter: Payer: Self-pay | Admitting: Family Medicine

## 2022-08-13 ENCOUNTER — Other Ambulatory Visit: Payer: Self-pay | Admitting: Family Medicine

## 2022-08-13 DIAGNOSIS — C482 Malignant neoplasm of peritoneum, unspecified: Secondary | ICD-10-CM

## 2022-08-13 DIAGNOSIS — Z86711 Personal history of pulmonary embolism: Secondary | ICD-10-CM

## 2022-08-13 MED ORDER — APIXABAN 5 MG PO TABS: 5.0000 mg | ORAL_TABLET | Freq: Two times a day (BID) | ORAL | 0 refills | Status: DC

## 2022-08-13 NOTE — Telephone Encounter (Signed)
I have sent rx in.  Looks like Dr [name] did fill it for her last year w/ refills. Not sure where that rx went but I've resent in.

## 2022-08-19 ENCOUNTER — Other Ambulatory Visit: Payer: Self-pay | Admitting: Hematology

## 2022-08-20 ENCOUNTER — Encounter: Payer: Self-pay | Admitting: Hematology

## 2022-08-20 ENCOUNTER — Ambulatory Visit (HOSPITAL_COMMUNITY)
Admission: RE | Admit: 2022-08-20 | Discharge: 2022-08-20 | Disposition: A | Discharge status: Home / Self Care | Payer: Commercial Managed Care - PPO | Source: Ambulatory Visit | Attending: Hematology | Admitting: Hematology

## 2022-08-20 DIAGNOSIS — C569 Malignant neoplasm of unspecified ovary: Secondary | ICD-10-CM | POA: Insufficient documentation

## 2022-08-20 DIAGNOSIS — C482 Malignant neoplasm of peritoneum, unspecified: Secondary | ICD-10-CM | POA: Insufficient documentation

## 2022-08-20 MED ORDER — IOHEXOL 9 MG/ML PO SOLN
ORAL | Status: AC
  Filled 2022-08-20: qty 1000

## 2022-08-20 MED ORDER — IOHEXOL 300 MG/ML  SOLN
100.0000 mL | Freq: Once | INTRAMUSCULAR | Status: AC | PRN
  Administered 2022-08-20: 100 mL via INTRAVENOUS

## 2022-08-29 ENCOUNTER — Inpatient Hospital Stay: Payer: Commercial Managed Care - PPO | Admitting: Hematology

## 2022-08-29 ENCOUNTER — Inpatient Hospital Stay: Payer: Commercial Managed Care - PPO

## 2022-08-29 ENCOUNTER — Telehealth: Payer: Self-pay | Admitting: Oncology

## 2022-08-29 VITALS — BP 121/63 | HR 88 | Temp 97.7°F | Resp 18

## 2022-08-29 VITALS — BP 131/69 | HR 96 | Temp 97.5°F | Resp 18 | Wt 193.0 lb

## 2022-08-29 DIAGNOSIS — C482 Malignant neoplasm of peritoneum, unspecified: Secondary | ICD-10-CM | POA: Diagnosis not present

## 2022-08-29 DIAGNOSIS — Z5111 Encounter for antineoplastic chemotherapy: Secondary | ICD-10-CM | POA: Diagnosis not present

## 2022-08-29 DIAGNOSIS — Z95828 Presence of other vascular implants and grafts: Secondary | ICD-10-CM

## 2022-08-29 LAB — COMPREHENSIVE METABOLIC PANEL
ALT: 12 U/L (ref 0–44)
AST: 13 U/L — ABNORMAL LOW (ref 15–41)
Albumin: 3.3 g/dL — ABNORMAL LOW (ref 3.5–5.0)
Alkaline Phosphatase: 64 U/L (ref 38–126)
Anion gap: 6 (ref 5–15)
BUN: 14 mg/dL (ref 6–20)
CO2: 25 mmol/L (ref 22–32)
Calcium: 8.4 mg/dL — ABNORMAL LOW (ref 8.9–10.3)
Chloride: 105 mmol/L (ref 98–111)
Creatinine, Ser: 0.49 mg/dL (ref 0.44–1.00)
GFR, Estimated: >60 mL/min (ref >60)
Glucose, Bld: 113 mg/dL — ABNORMAL HIGH (ref 70–99)
Potassium: 3.8 mmol/L (ref 3.5–5.1)
Sodium: 136 mmol/L (ref 135–145)
Total Bilirubin: 0.5 mg/dL (ref 0.3–1.2)
Total Protein: 6.5 g/dL (ref 6.5–8.1)

## 2022-08-29 LAB — CBC WITH DIFFERENTIAL/PLATELET
Abs Immature Granulocytes: 0.02 10*3/uL (ref 0.00–0.07)
Basophils Absolute: 0 10*3/uL (ref 0.0–0.1)
Basophils Relative: 0 %
Eosinophils Absolute: 0.1 10*3/uL (ref 0.0–0.5)
Eosinophils Relative: 2 %
HCT: 29.7 % — ABNORMAL LOW (ref 36.0–46.0)
Hemoglobin: 9.9 g/dL — ABNORMAL LOW (ref 12.0–15.0)
Immature Granulocytes: 1 %
Lymphocytes Relative: 32 %
Lymphs Abs: 1.2 10*3/uL (ref 0.7–4.0)
MCH: 34.4 pg — ABNORMAL HIGH (ref 26.0–34.0)
MCHC: 33.3 g/dL (ref 30.0–36.0)
MCV: 103.1 fL — ABNORMAL HIGH (ref 80.0–100.0)
Monocytes Absolute: 0.4 10*3/uL (ref 0.1–1.0)
Monocytes Relative: 11 %
Neutro Abs: 2.1 10*3/uL (ref 1.7–7.7)
Neutrophils Relative %: 54 %
Platelets: 118 10*3/uL — ABNORMAL LOW (ref 150–400)
RBC: 2.88 MIL/uL — ABNORMAL LOW (ref 3.87–5.11)
RDW: 14.9 % (ref 11.5–15.5)
WBC: 3.8 10*3/uL — ABNORMAL LOW (ref 4.0–10.5)
nRBC: 0 % (ref 0.0–0.2)

## 2022-08-29 LAB — MAGNESIUM: Magnesium: 1.6 mg/dL — ABNORMAL LOW (ref 1.7–2.4)

## 2022-08-29 MED ORDER — FAMOTIDINE IN NACL 20-0.9 MG/50ML-% IV SOLN
20.0000 mg | Freq: Once | INTRAVENOUS | Status: AC
  Administered 2022-08-29: 20 mg via INTRAVENOUS
  Filled 2022-08-29: qty 50

## 2022-08-29 MED ORDER — CETIRIZINE HCL 10 MG/ML IV SOLN
10.0000 mg | Freq: Once | INTRAVENOUS | Status: AC
  Administered 2022-08-29: 10 mg via INTRAVENOUS
  Filled 2022-08-29: qty 1

## 2022-08-29 MED ORDER — SODIUM CHLORIDE 0.9 % IV SOLN: Freq: Once | INTRAVENOUS | Status: AC

## 2022-08-29 MED ORDER — MAGNESIUM SULFATE 2 GM/50ML IV SOLN
2.0000 g | Freq: Once | INTRAVENOUS | Status: AC
  Administered 2022-08-29: 2 g via INTRAVENOUS
  Filled 2022-08-29: qty 50

## 2022-08-29 MED ORDER — SODIUM CHLORIDE 0.9 % IV SOLN
140.0000 mg/m2 | Freq: Once | INTRAVENOUS | Status: AC
  Administered 2022-08-29: 276 mg via INTRAVENOUS
  Filled 2022-08-29: qty 46

## 2022-08-29 MED ORDER — HEPARIN SOD (PORK) LOCK FLUSH 100 UNIT/ML IV SOLN
500.0000 [IU] | Freq: Once | INTRAVENOUS | Status: AC | PRN
  Administered 2022-08-29: 500 [IU]

## 2022-08-29 MED ORDER — SODIUM CHLORIDE 0.9% FLUSH
10.0000 mL | Freq: Once | INTRAVENOUS | Status: AC
  Administered 2022-08-29: 10 mL via INTRAVENOUS

## 2022-08-29 MED ORDER — SODIUM CHLORIDE 0.9% FLUSH
10.0000 mL | INTRAVENOUS | Status: DC | PRN
  Administered 2022-08-29: 10 mL

## 2022-08-29 MED ORDER — PALONOSETRON HCL INJECTION 0.25 MG/5ML
0.2500 mg | Freq: Once | INTRAVENOUS | Status: AC
  Administered 2022-08-29: 0.25 mg via INTRAVENOUS
  Filled 2022-08-29: qty 5

## 2022-08-29 MED ORDER — SODIUM CHLORIDE 0.9 % IV SOLN
720.0000 mg | Freq: Once | INTRAVENOUS | Status: AC
  Administered 2022-08-29: 720 mg via INTRAVENOUS
  Filled 2022-08-29: qty 72

## 2022-08-29 MED ORDER — SODIUM CHLORIDE 0.9 % IV SOLN
10.0000 mg | Freq: Once | INTRAVENOUS | Status: AC
  Administered 2022-08-29: 10 mg via INTRAVENOUS
  Filled 2022-08-29: qty 10

## 2022-08-29 MED ORDER — SODIUM CHLORIDE 0.9 % IV SOLN
150.0000 mg | Freq: Once | INTRAVENOUS | Status: AC
  Administered 2022-08-29: 150 mg via INTRAVENOUS
  Filled 2022-08-29: qty 150

## 2022-08-29 NOTE — Telephone Encounter (Signed)
Called Nicole Santos and rescheduled her visit with Dr. Berline Lopes to 09/21/22 at 8:00.  Discussed that we will watch for an opening on 09/14/22 and will call if an appointment becomes available.  She verbalized understanding and agreement.

## 2022-08-29 NOTE — Patient Instructions (Addendum)
[facility] at [facility] Discharge Instructions   You were seen and examined today by Dr. Delton Coombes.  He reviewed the results of your lab work which are normal/stable.   He reviewed the results of your CT scan which is stable.   We will proceed with your treatment today.   Follow up with Dr. Berline Lopes as scheduled on 08/31/22.   Return as scheduled.    Thank you for choosing [place] at [facility] to provide your oncology and hematology care.  To afford each patient quality time with our provider, please arrive at least 15 minutes before your scheduled appointment time.   If you have a lab appointment with the [facility] please come in thru the Main Entrance and check in at the main information desk.  You need to re-schedule your appointment should you arrive 10 or more minutes late.  We strive to give you quality time with our providers, and arriving late affects you and other patients whose appointments are after yours.  Also, if you no show three or more times for appointments you may be dismissed from the clinic at the providers discretion.     Again, thank you for choosing [facility].  Our hope is that these requests will decrease the amount of time that you wait before being seen by our physicians.       _____________________________________________________________  Should you have questions after your visit to [facility], please contact our office at [phone] and follow the prompts.  Our office hours are 8:00 a.m. and 4:30 p.m. Monday - Friday.  Please note that voicemails left after 4:00 p.m. may not be returned until the following business day.  We are closed weekends and major holidays.  You do have access to a nurse 24-7, just call the main number to the clinic [phone] and do not press any options, hold on the line and a nurse will answer the phone.    For prescription refill requests, have  your pharmacy contact our office and allow 72 hours.    Due to Covid, you will need to wear a mask upon entering the hospital. If you do not have a mask, a mask will be given to you at the Main Entrance upon arrival. For doctor visits, patients may have 1 support person age 29 or older with them. For treatment visits, patients can not have anyone with them due to social distancing guidelines and our immunocompromised population.

## 2022-08-29 NOTE — Progress Notes (Signed)
Patient presents today for treatment and follow visit with Dr. Delton Coombes. Labs within parameters for treatment. Magnesium 1.6 today. Vital signs within parameters for treatment.   Message received from A. Ouida Sills RN / Dr. Delton Coombes to proceed with treatment . Orders received to administer 2 grams IV Magnesium Sulfate with treatment.    Treatment given today per MD orders. Tolerated infusion without adverse affects. Vital signs stable. No complaints at this time. Discharged from clinic ambulatory in stable condition. Alert and oriented x 3. F/U with [facility] as scheduled.

## 2022-08-29 NOTE — Progress Notes (Signed)
[facility] [address], [place] [address]    Clinic Day:  08/29/2022  Referring physician: Janora Norlander, DO  Patient Care Team: Janora Norlander, DO as PCP - General (Family Medicine) Harlen Labs, MD as Referring Physician (Optometry) Derek Jack, MD as Medical Oncologist (Medical Oncology) Brien Mates, RN as Oncology Nurse Navigator (Medical Oncology)   ASSESSMENT & PLAN:   Assessment: High-grade serous carcinoma of peritoneum: - She had developed sharp left-sided abdominal pain since Mother's Day, occasional radiating to the back.  40 pound weight loss in the last 6 months. - Personal history of cervical cancer in 2000, status post chemo plus XRT at [facility].  She has slightly decreased hearing in the left ear since then. - US abdomen (11/08/2021): Cholelithiasis with gallbladder wall thickening and sonographic Murphy sign.  Upper abdominal ascites. - Laparoscopy (11/13/2021): Starting of the omentum as well as the upper abdominal wall and diaphragm.  Liver was within normal limits.  6 L of ascites was evacuated.  Omental and peritoneal biopsies were sent.  Peritoneal implants throughout and a significant portion of omentum and on the small intestine. - CT CAP (11/13/2021): Ascites, extensive peritoneal thickening, nodularity and omental caking.  No evidence of primary malignancy in the chest, abdomen or pelvis.  Small left pleural effusion, nonspecific.  Cholelithiasis. - US pelvis (11/14/2021): Uterus is grossly unremarkable.  Ovaries not visualized. - CA125 (11/13/2021): 1269 - Pathology: Peritoneum and omental biopsy consistent with high-grade serous carcinoma.  HER2 2+ by IHC and negative by FISH. - 4 cycles of carboplatin and paclitaxel from 11/21/2021 through 01/23/2022    Social/family history: - Lives at home with her husband.  Worked in Scientist, research (medical) at [employer].  Non-smoker. - Maternal grandmother with bladder cancer.  Maternal  great grandmother with breast cancer.  Plan: High-grade serous carcinoma of peritoneum: - She has tolerated last cycle of chemotherapy reasonably well. - She did not have any major GI side effects. - CTAP (08/20/2022): Improved peritoneal carcinomatosis and mild ascites.  Prominent/mildly enlarged loops of small bowel with transition point in the pelvis not significantly changed from last scan. - Reviewed labs today which showed normal LFTs.  CBC was grossly normal with mild leukopenia and thrombocytopenia.  CEA was 11.6. - She will proceed with chemotherapy today. - She has an appointment to see Dr. Berline Lopes on 08/31/2022 to talk about surgical debulking.  RTC 3 weeks for follow-up.     2.  Peripheral neuropathy: - Continue Cymbalta 60 mg daily.  She reports toes feel like wrapped in gauze.  Will closely monitor.  3.  Diarrhea: - Continue Lomotil as needed for diarrhea.  4.  Hypomagnesemia: - Continue magnesium twice daily.  Magnesium is 1.6 today.  She will receive 2 g IV magnesium.   5.  Hypokalemia: - Continue potassium 20 mEq daily.  Potassium is normal today.   6.  Subsegmental pulmonary embolism (Dx 03/06/2022): - Continue Eliquis twice daily.  No bleeding issues.  No orders of the defined types were placed in this encounter.    I,Alexis Herring,acting as a Education administrator for [employer], MD.,have documented all relevant documentation on the behalf of Derek Jack, MD,as directed by  Derek Jack, MD while in the presence of Derek Jack, MD.  I, Derek Jack MD, have reviewed the above documentation for accuracy and completeness, and I agree with the above.    Derek Jack, MD   3/27/20246:44 PM  CHIEF COMPLAINT:   Diagnosis:  high-grade serous carcinoma of peritoneum    Cancer Staging  Primary peritoneal carcinomatosis [facility]) Staging form: Ovary, Fallopian Tube, and Primary Peritoneal Carcinoma, AJCC 8th Edition - Clinical stage from  11/17/2021: FIGO Stage IIIC (cT3c, cM0) - Unsigned    Prior Therapy: Carboplatin and paclitaxel x 4 cycles, completed on 01/23/2022   Current Therapy:  Carboplatin (AUC 6) + Paclitaxel  HISTORY OF PRESENT ILLNESS:   Oncology History  Primary peritoneal carcinomatosis ([place])  11/17/2021 Initial Diagnosis   Primary peritoneal carcinomatosis ([place])   11/21/2021 - 01/25/2022 Chemotherapy   Patient is on Treatment Plan : OVARIAN Carboplatin (AUC 6) / Paclitaxel (175) q21d x 6 cycles     11/21/2021 -  Chemotherapy   Patient is on Treatment Plan : OVARIAN Carboplatin (AUC 6) + Paclitaxel (175) q21d X 6 Cycles       INTERVAL HISTORY:   Nicole Santos is a 52 y.o. female presenting to clinic today for follow up of high-grade serous carcinoma of peritoneum. She was last seen by me on 08/07/22.  Today, she states that she is doing well overall. Her appetite level is at 100%. Her energy level is at 100%. She states that she did fine with her last treatment. She is scheduled to see gyn onc Dr. Berline Lopes on 3/29. She reports that at her last visit with Dr. Berline Lopes they discussed the surgical plan.   She states that her numbness in her toes has persisted and slightly worsened. However, she denies any pain or balance issues. She denies any difficulty walking. She is still taking Cymbalta.  She denies any nausea or vomiting.  She reports that she is taking MagOx still but sometimes forgets her morning pill.  PAST MEDICAL HISTORY:   Past Medical History: Past Medical History:  Diagnosis Date   Acute respiratory disease due to COVID-19 virus 02/02/2019   Anxiety    Cancer ([place])    Depression    Dyspnea    Dysrhythmia    History of diabetes mellitus    Resolved in Sept 2023 with weight loss.   Hypertension    Ovarian cancer ([place]) 11/14/2021   Peritoneal carcinomatosis ([place])    Pneumonia    Sleep apnea    doesn't use CPAP   Tachycardia     Surgical History: Past Surgical History:  Procedure Laterality  Date   KNEE SURGERY Right    Arthroscopic   LAPAROSCOPIC ABDOMINAL EXPLORATION N/A 11/13/2021   Procedure: Exploratory laparoscopy;  Surgeon: Aviva Signs, MD;  Location: [facility];  Service: General;  Laterality: N/A;   PORTACATH PLACEMENT Left 11/15/2021   Procedure: INSERTION PORT-A-CATH;  Surgeon: Aviva Signs, MD;  Location: [facility];  Service: General;  Laterality: Left;   TONSILLECTOMY AND ADENOIDECTOMY      Social History: Social History   Socioeconomic History   Marital status: Married    Spouse name: Not on file   Number of children: Not on file   Years of education: Not on file   Highest education level: Not on file  Occupational History   Occupation: Unemployed  Tobacco Use   Smoking status: Never   Smokeless tobacco: Never  Vaping Use   Vaping Use: Never used  Substance and Sexual Activity   Alcohol use: No   Drug use: No   Sexual activity: Not Currently  Other Topics Concern   Not on file  Social History Narrative   Not on file   Social Determinants of Health   Financial Resource Strain: Not on file  Food  Insecurity: No Food Insecurity (03/06/2022)   Hunger Vital Sign    Worried About Running Out of Food in the Last Year: Never true    Ran Out of Food in the Last Year: Never true  Transportation Needs: No Transportation Needs (03/06/2022)   PRAPARE - Hydrologist (Medical): No    Lack of Transportation (Non-Medical): No  Physical Activity: Not on file  Stress: Not on file  Social Connections: Not on file  Intimate Partner Violence: Not At Risk (03/06/2022)   Humiliation, Afraid, Rape, and Kick questionnaire    Fear of Current or Ex-Partner: No    Emotionally Abused: No    Physically Abused: No    Sexually Abused: No    Family History: Family History  Problem Relation Age of Onset   Hypertension Mother    Bladder Cancer Maternal Grandmother    Breast cancer Maternal Great-grandmother    Colon cancer Neg Hx    Ovarian  cancer Neg Hx    Endometrial cancer Neg Hx    Pancreatic cancer Neg Hx    Prostate cancer Neg Hx     Current Medications:  Current Outpatient Medications:    apixaban (ELIQUIS) 5 MG TABS tablet, Take 1 tablet (5 mg total) by mouth 2 (two) times daily., Disp: 180 tablet, Rfl: 0   atorvastatin (LIPITOR) 20 MG tablet, Take 1 tablet (20 mg total) by mouth daily. (Patient taking differently: Take 20 mg by mouth at bedtime.), Disp: 90 tablet, Rfl: 1   diphenoxylate-atropine (LOMOTIL) 2.5-0.025 MG tablet, Take 2 tablets by mouth after the first watery bowel movement. May then take 1 pill by mouth after each watery bowel movement. Do not exceed 8 pills in a 24 hour period., Disp: 60 tablet, Rfl: 3   DULoxetine (CYMBALTA) 30 MG capsule, Take 1 capsule (30 mg total) by mouth daily. Take 1 capsule by mouth x 7 days, then increase to 2 capsules daily, Disp: 90 capsule, Rfl: 3   magnesium oxide (MAG-OX) 400 (240 Mg) MG tablet, TAKE 1 TABLET BY MOUTH TWICE A DAY, Disp: 60 tablet, Rfl: 2   Multiple Vitamin (MULTIVITAMIN) capsule, Take 1 capsule by mouth daily., Disp: , Rfl:    Potassium 99 MG TABS, Take 99 mg by mouth at bedtime., Disp: , Rfl:    prochlorperazine (COMPAZINE) 10 MG tablet, Take 1 tablet (10 mg total) by mouth every 6 (six) hours as needed for nausea or vomiting., Disp: 30 tablet, Rfl: 3 No current facility-administered medications for this visit.  Facility-Administered Medications Ordered in Other Visits:    heparin lock flush 100 unit/mL, 500 Units, Intravenous, Once, Derek Jack, MD   sodium chloride flush (NS) 0.9 % injection 10 mL, 10 mL, Intravenous, PRN, Derek Jack, MD   sodium chloride flush (NS) 0.9 % injection 10 mL, 10 mL, Intracatheter, PRN, Derek Jack, MD, 10 mL at 08/29/22 1544   Allergies: Allergies  Allergen Reactions   Augmentin [Amoxicillin-Pot Clavulanate] Diarrhea and Nausea And Vomiting   Doxycycline     Abdominal cramps.   Topamax  [Topiramate] Nausea And Vomiting    REVIEW OF SYSTEMS:   Review of Systems  Constitutional:  Negative for chills, fatigue and fever.  HENT:   Negative for lump/mass, mouth sores, nosebleeds, sore throat and trouble swallowing.   Eyes:  Negative for eye problems.  Respiratory:  Negative for cough and shortness of breath.   Cardiovascular:  Negative for chest pain, leg swelling and palpitations.  Gastrointestinal:  Negative for  abdominal pain, constipation, diarrhea, nausea and vomiting.  Genitourinary:  Negative for bladder incontinence, difficulty urinating, dysuria, frequency, hematuria and nocturia.   Musculoskeletal:  Negative for arthralgias, back pain, flank pain, myalgias and neck pain.  Skin:  Negative for itching and rash.  Neurological:  Positive for headaches (x1 episode) and numbness (feet). Negative for dizziness.  Hematological:  Does not bruise/bleed easily.  Psychiatric/Behavioral:  Positive for sleep disturbance. Negative for depression and suicidal ideas. The patient is not nervous/anxious.   All other systems reviewed and are negative.    VITALS:   There were no vitals taken for this visit.  Wt Readings from Last 3 Encounters:  08/29/22 193 lb (87.5 kg)  08/07/22 191 lb 6.4 oz (86.8 kg)  07/16/22 187 lb 14.4 oz (85.2 kg)    There is no height or weight on file to calculate BMI.  Performance status (ECOG): 1 - Symptomatic but completely ambulatory  PHYSICAL EXAM:   Physical Exam Vitals and nursing note reviewed. Exam conducted with a chaperone present.  Constitutional:      Appearance: Normal appearance.  Cardiovascular:     Rate and Rhythm: Normal rate and regular rhythm.     Pulses: Normal pulses.     Heart sounds: Normal heart sounds.  Pulmonary:     Effort: Pulmonary effort is normal.     Breath sounds: Normal breath sounds.  Abdominal:     Palpations: Abdomen is soft. There is no hepatomegaly, splenomegaly or mass.     Tenderness: There is no  abdominal tenderness.  Musculoskeletal:     Right lower leg: No edema.     Left lower leg: No edema.  Lymphadenopathy:     Cervical: No cervical adenopathy.     Right cervical: No superficial, deep or posterior cervical adenopathy.    Left cervical: No superficial, deep or posterior cervical adenopathy.     Upper Body:     Right upper body: No supraclavicular or axillary adenopathy.     Left upper body: No supraclavicular or axillary adenopathy.  Neurological:     General: No focal deficit present.     Mental Status: She is alert and oriented to person, place, and time.  Psychiatric:        Mood and Affect: Mood normal.        Behavior: Behavior normal.     LABS:      Latest Ref Rng & Units 08/29/2022    8:25 AM 08/07/2022    8:13 AM 07/16/2022    8:19 AM  CBC  WBC 4.0 - 10.5 K/uL 3.8  4.3  4.1   Hemoglobin 12.0 - 15.0 g/dL 9.9  10.3  10.1   Hematocrit 36.0 - 46.0 % 29.7  31.6  30.7   Platelets 150 - 400 K/uL 118  188  203       Latest Ref Rng & Units 08/29/2022    8:25 AM 08/07/2022    8:13 AM 07/16/2022    8:19 AM  CMP  Glucose 70 - 99 mg/dL 113  117  118   BUN 6 - 20 mg/dL 14  8  11    Creatinine 0.44 - 1.00 mg/dL 0.49  0.53  0.59   Sodium 135 - 145 mmol/L 136  137  140   Potassium 3.5 - 5.1 mmol/L 3.8  3.9  4.1   Chloride 98 - 111 mmol/L 105  105  105   CO2 22 - 32 mmol/L 25  25  27    Calcium  8.9 - 10.3 mg/dL 8.4  8.6  8.7   Total Protein 6.5 - 8.1 g/dL 6.5  6.4  6.4   Total Bilirubin 0.3 - 1.2 mg/dL 0.5  0.8  0.7   Alkaline Phos 38 - 126 U/L 64  63  64   AST 15 - 41 U/L 13  20  17    ALT 0 - 44 U/L 12  16  13       Lab Results  Component Value Date   CEA1 1.1 11/13/2021   /  CEA  Date Value Ref Range Status  11/13/2021 1.1 0.0 - 4.7 ng/mL Final    Comment:    (NOTE)                             Nonsmokers          <3.9                             Smokers             <5.6 [employer] Electrochemiluminescence Immunoassay (ECLIA) Values obtained with  different assay methods or kits cannot be used interchangeably.  Results cannot be interpreted as absolute evidence of the presence or absence of malignant disease. Performed At: [facility] [place], [place] [id number] Rush Farmer MD [id number]    No results found for: "PSA1" Lab Results  Component Value Date   CAN199 6 11/13/2021   Lab Results  Component Value Date   CAN125 11.6 07/16/2022    No results found for: "TOTALPROTELP", "ALBUMINELP", "A1GS", "A2GS", "BETS", "BETA2SER", "GAMS", "MSPIKE", "SPEI" Lab Results  Component Value Date   FERRITIN 356 (H) 02/07/2019   FERRITIN 311 (H) 02/06/2019   FERRITIN 343 (H) 02/05/2019   Lab Results  Component Value Date   LDH 259 (H) 02/02/2019     STUDIES:   CT Abdomen Pelvis W Contrast  Result Date: 08/21/2022 CLINICAL DATA:  History of metastatic ovarian cancer assess treatment response. * Tracking Code: BO * EXAM: CT ABDOMEN AND PELVIS WITH CONTRAST TECHNIQUE: Multidetector CT imaging of the abdomen and pelvis was performed using the standard protocol following bolus administration of intravenous contrast. RADIATION DOSE REDUCTION: This exam was performed according to the departmental dose-optimization program which includes automated exposure control, adjustment of the mA and/or kV according to patient size and/or use of iterative reconstruction technique. CONTRAST:  132mL OMNIPAQUE IOHEXOL 300 MG/ML  SOLN COMPARISON:  Multiple priors including most recent CT abdomen pelvis May 24, 2022 FINDINGS: Lower chest: No acute abnormality. Hepatobiliary: No suspicious hepatic lesion. Cholelithiasis without findings of acute cholecystitis. No biliary ductal dilation. Pancreas: No pancreatic ductal dilation or evidence of acute inflammation. Spleen: No splenomegaly. Adrenals/Urinary Tract: Bilateral adrenal glands appear normal. No hydronephrosis. Hypodense 19 mm left interpolar renal lesion demonstrates  Hounsfield units of fluid compatible with a cyst and considered benign requiring no independent imaging follow-up. Symmetric bilateral renal enhancement. Urinary bladder is minimally distended limiting evaluation. Stomach/Bowel: Radiopaque enteric contrast material traverses the splenic flexure. Stomach is unremarkable for degree of distension. Prominent/mildly enlarged loops of small bowel throughout the abdomen with transition point in the pelvis on image 65/2, not significantly changed from prior and compatible with a least low-grade obstruction. No evidence of acute bowel inflammation. Vascular/Lymphatic: . normal caliber abdominal aorta. Metallic stent graft in the left common iliac artery. No pathologically enlarged abdominal or pelvic lymph  nodes. Reproductive: Uterus is unchanged.  No suspicious adnexal mass. Other: Decreased soft tissue stranding in the omental fat. Similar trace abdominopelvic free fluid. Musculoskeletal: No aggressive lytic or blastic lesion of bone. IMPRESSION: 1. Improved peritoneal carcinomatosis and mild ascites. 2. Prominent/mildly enlarged loops of small bowel throughout the abdomen with transition point in the pelvis, not significantly changed from prior and compatible with a least low-grade obstruction. 3. Cholelithiasis without findings of acute cholecystitis. Electronically Signed   By: Dahlia Bailiff M.D.   On: 08/21/2022 13:39

## 2022-08-29 NOTE — Patient Instructions (Signed)
[facility]  Discharge Instructions: Thank you for choosing [facility] to provide your oncology and hematology care.  If you have a lab appointment with the [facility], please come in thru the Main Entrance and check in at the main information desk.  Wear comfortable clothing and clothing appropriate for easy access to any Portacath or PICC line.   We strive to give you quality time with your provider. You may need to reschedule your appointment if you arrive late (15 or more minutes).  Arriving late affects you and other patients whose appointments are after yours.  Also, if you miss three or more appointments without notifying the office, you may be dismissed from the clinic at the provider's discretion.      For prescription refill requests, have your pharmacy contact our office and allow 72 hours for refills to be completed.    Today you received the following chemotherapy and/or immunotherapy agents Taxol and Carboplatin.Carboplatin Injection What is this medication? CARBOPLATIN (KAR boe pla tin) treats some types of cancer. It works by slowing down the growth of cancer cells. This medicine may be used for other purposes; ask your health care provider or pharmacist if you have questions. COMMON BRAND NAME(S): Paraplatin What should I tell my care team before I take this medication? They need to know if you have any of these conditions: Blood disorders Hearing problems Kidney disease Recent or ongoing radiation therapy An unusual or allergic reaction to carboplatin, cisplatin, other medications, foods, dyes, or preservatives Pregnant or trying to get pregnant Breast-feeding How should I use this medication? This medication is injected into a vein. It is given by your care team in a hospital or clinic setting. Talk to your care team about the use of this medication in children. Special care may be needed. Overdosage: If you think you have taken  too much of this medicine contact a poison control center or emergency room at once. NOTE: This medicine is only for you. Do not share this medicine with others. What if I miss a dose? Keep appointments for follow-up doses. It is important not to miss your dose. Call your care team if you are unable to keep an appointment. What may interact with this medication? Medications for seizures Some antibiotics, such as amikacin, gentamicin, neomycin, streptomycin, tobramycin Vaccines This list may not describe all possible interactions. Give your health care provider a list of all the medicines, herbs, non-prescription drugs, or dietary supplements you use. Also tell them if you smoke, drink alcohol, or use illegal drugs. Some items may interact with your medicine. What should I watch for while using this medication? Your condition will be monitored carefully while you are receiving this medication. You may need blood work while taking this medication. This medication may make you feel generally unwell. This is not uncommon, as chemotherapy can affect healthy cells as well as cancer cells. Report any side effects. Continue your course of treatment even though you feel ill unless your care team tells you to stop. In some cases, you may be given additional medications to help with side effects. Follow all directions for their use. This medication may increase your risk of getting an infection. Call your care team for advice if you get a fever, chills, sore throat, or other symptoms of a cold or flu. Do not treat yourself. Try to avoid being around people who are sick. Avoid taking medications that contain aspirin, acetaminophen, ibuprofen, naproxen, or ketoprofen unless  instructed by your care team. These medications may hide a fever. Be careful brushing or flossing your teeth or using a toothpick because you may get an infection or bleed more easily. If you have any dental work done, tell your dentist you are  receiving this medication. Talk to your care team if you wish to become pregnant or think you might be pregnant. This medication can cause serious birth defects. Talk to your care team about effective forms of contraception. Do not breast-feed while taking this medication. What side effects may I notice from receiving this medication? Side effects that you should report to your care team as soon as possible: Allergic reactions--skin rash, itching, hives, swelling of the face, lips, tongue, or throat Infection--fever, chills, cough, sore throat, wounds that don't heal, pain or trouble when passing urine, general feeling of discomfort or being unwell Low red blood cell level--unusual weakness or fatigue, dizziness, headache, trouble breathing Pain, tingling, or numbness in the hands or feet, muscle weakness, change in vision, confusion or trouble speaking, loss of balance or coordination, trouble walking, seizures Unusual bruising or bleeding Side effects that usually do not require medical attention (report to your care team if they continue or are bothersome): Hair loss Nausea Unusual weakness or fatigue Vomiting This list may not describe all possible side effects. Call your doctor for medical advice about side effects. You may report side effects to FDA at 1-800-FDA-1088. Where should I keep my medication? This medication is given in a hospital or clinic. It will not be stored at home. NOTE: This sheet is a summary. It may not cover all possible information. If you have questions about this medicine, talk to your doctor, pharmacist, or health care provider.  2023 Elsevier/Gold Standard (2007-07-12 00:00:00) Paclitaxel Injection What is this medication? PACLITAXEL (PAK li TAX el) treats some types of cancer. It works by slowing down the growth of cancer cells. This medicine may be used for other purposes; ask your health care provider or pharmacist if you have questions. COMMON BRAND  NAME(S): Onxol, Taxol What should I tell my care team before I take this medication? They need to know if you have any of these conditions: Heart disease Liver disease Low white blood cell levels An unusual or allergic reaction to paclitaxel, other medications, foods, dyes, or preservatives If you or your partner are pregnant or trying to get pregnant Breast-feeding How should I use this medication? This medication is injected into a vein. It is given by your care team in a hospital or clinic setting. Talk to your care team about the use of this medication in children. While it may be given to children for selected conditions, precautions do apply. Overdosage: If you think you have taken too much of this medicine contact a poison control center or emergency room at once. NOTE: This medicine is only for you. Do not share this medicine with others. What if I miss a dose? Keep appointments for follow-up doses. It is important not to miss your dose. Call your care team if you are unable to keep an appointment. What may interact with this medication? Do not take this medication with any of the following: Live virus vaccines Other medications may affect the way this medication works. Talk with your care team about all of the medications you take. They may suggest changes to your treatment plan to lower the risk of side effects and to make sure your medications work as intended. This list may not describe all  possible interactions. Give your health care provider a list of all the medicines, herbs, non-prescription drugs, or dietary supplements you use. Also tell them if you smoke, drink alcohol, or use illegal drugs. Some items may interact with your medicine. What should I watch for while using this medication? Your condition will be monitored carefully while you are receiving this medication. You may need blood work while taking this medication. This medication may make you feel generally unwell.  This is not uncommon as chemotherapy can affect healthy cells as well as cancer cells. Report any side effects. Continue your course of treatment even though you feel ill unless your care team tells you to stop. This medication can cause serious allergic reactions. To reduce the risk, your care team may give you other medications to take before receiving this one. Be sure to follow the directions from your care team. This medication may increase your risk of getting an infection. Call your care team for advice if you get a fever, chills, sore throat, or other symptoms of a cold or flu. Do not treat yourself. Try to avoid being around people who are sick. This medication may increase your risk to bruise or bleed. Call your care team if you notice any unusual bleeding. Be careful brushing or flossing your teeth or using a toothpick because you may get an infection or bleed more easily. If you have any dental work done, tell your dentist you are receiving this medication. Talk to your care team if you may be pregnant. Serious birth defects can occur if you take this medication during pregnancy. Talk to your care team before breastfeeding. Changes to your treatment plan may be needed. What side effects may I notice from receiving this medication? Side effects that you should report to your care team as soon as possible: Allergic reactions--skin rash, itching, hives, swelling of the face, lips, tongue, or throat Heart rhythm changes--fast or irregular heartbeat, dizziness, feeling faint or lightheaded, chest pain, trouble breathing Increase in blood pressure Infection--fever, chills, cough, sore throat, wounds that don't heal, pain or trouble when passing urine, general feeling of discomfort or being unwell Low blood pressure--dizziness, feeling faint or lightheaded, blurry vision Low red blood cell level--unusual weakness or fatigue, dizziness, headache, trouble breathing Painful swelling, warmth, or  redness of the skin, blisters or sores at the infusion site Pain, tingling, or numbness in the hands or feet Slow heartbeat--dizziness, feeling faint or lightheaded, confusion, trouble breathing, unusual weakness or fatigue Unusual bruising or bleeding Side effects that usually do not require medical attention (report to your care team if they continue or are bothersome): Diarrhea Hair loss Joint pain Loss of appetite Muscle pain Nausea Vomiting This list may not describe all possible side effects. Call your doctor for medical advice about side effects. You may report side effects to FDA at 1-800-FDA-1088. Where should I keep my medication? This medication is given in a hospital or clinic. It will not be stored at home. NOTE: This sheet is a summary. It may not cover all possible information. If you have questions about this medicine, talk to your doctor, pharmacist, or health care provider.  2023 Elsevier/Gold Standard (2021-10-05 00:00:00)       To help prevent nausea and vomiting after your treatment, we encourage you to take your nausea medication as directed.  BELOW ARE SYMPTOMS THAT SHOULD BE REPORTED IMMEDIATELY: *FEVER GREATER THAN 100.4 F (38 C) OR HIGHER *CHILLS OR SWEATING *NAUSEA AND VOMITING THAT IS NOT CONTROLLED WITH  YOUR NAUSEA MEDICATION *UNUSUAL SHORTNESS OF BREATH *UNUSUAL BRUISING OR BLEEDING *URINARY PROBLEMS (pain or burning when urinating, or frequent urination) *BOWEL PROBLEMS (unusual diarrhea, constipation, pain near the anus) TENDERNESS IN MOUTH AND THROAT WITH OR WITHOUT PRESENCE OF ULCERS (sore throat, sores in mouth, or a toothache) UNUSUAL RASH, SWELLING OR PAIN  UNUSUAL VAGINAL DISCHARGE OR ITCHING   Items with * indicate a potential emergency and should be followed up as soon as possible or go to the Emergency Department if any problems should occur.  Please show the CHEMOTHERAPY ALERT CARD or IMMUNOTHERAPY ALERT CARD at check-in to the Emergency  Department and triage nurse.  Should you have questions after your visit or need to cancel or reschedule your appointment, please contact [facility] [phone]  and follow the prompts.  Office hours are 8:00 a.m. to 4:30 p.m. Monday - Friday. Please note that voicemails left after 4:00 p.m. may not be returned until the following business day.  We are closed weekends and major holidays. You have access to a nurse at all times for urgent questions. Please call the main number to the clinic [phone] and follow the prompts.  For any non-urgent questions, you may also contact your provider using MyChart. We now offer e-Visits for anyone 59 and older to request care online for non-urgent symptoms. For details visit [email or web address].   Also download the MyChart app! Go to the app store, search "MyChart", open the app, select [facility], and log in with your MyChart username and password.

## 2022-08-31 ENCOUNTER — Inpatient Hospital Stay: Payer: Commercial Managed Care - PPO

## 2022-08-31 ENCOUNTER — Inpatient Hospital Stay: Payer: Commercial Managed Care - PPO | Admitting: Gynecologic Oncology

## 2022-08-31 VITALS — BP 118/63 | HR 101 | Temp 97.3°F | Resp 19

## 2022-08-31 DIAGNOSIS — Z5111 Encounter for antineoplastic chemotherapy: Secondary | ICD-10-CM | POA: Diagnosis not present

## 2022-08-31 DIAGNOSIS — C482 Malignant neoplasm of peritoneum, unspecified: Secondary | ICD-10-CM

## 2022-08-31 MED ORDER — PEGFILGRASTIM INJECTION 6 MG/0.6ML ~~LOC~~
6.0000 mg | PREFILLED_SYRINGE | Freq: Once | SUBCUTANEOUS | Status: AC
  Administered 2022-08-31: 6 mg via SUBCUTANEOUS
  Filled 2022-08-31: qty 0.6

## 2022-08-31 NOTE — Patient Instructions (Signed)
[facility]  Discharge Instructions: Thank you for choosing [facility] to provide your oncology and hematology care.  If you have a lab appointment with the Cancer Center, please come in thru the Main Entrance and check in at the main information desk.  Wear comfortable clothing and clothing appropriate for easy access to any Portacath or PICC line.   We strive to give you quality time with your provider. You may need to reschedule your appointment if you arrive late (15 or more minutes).  Arriving late affects you and other patients whose appointments are after yours.  Also, if you miss three or more appointments without notifying the office, you may be dismissed from the clinic at the provider's discretion.      For prescription refill requests, have your pharmacy contact our office and allow 72 hours for refills to be completed.    Today you received the following chemotherapy and/or immunotherapy agents Neulasta injection. Pegfilgrastim Injection What is this medication? PEGFILGRASTIM (PEG fil gra stim) lowers the risk of infection in people who are receiving chemotherapy. It works by helping your body make more white blood cells, which protects your body from infection. It may also be used to help people who have been exposed to high doses of radiation. This medicine may be used for other purposes; ask your health care provider or pharmacist if you have questions. COMMON BRAND NAME(S): Fulphila, Fylnetra, Neulasta, Nyvepria, Stimufend, UDENYCA, Ziextenzo What should I tell my care team before I take this medication? They need to know if you have any of these conditions: Kidney disease Latex allergy Ongoing radiation therapy Sickle cell disease Skin reactions to acrylic adhesives (On-Body Injector only) An unusual or allergic reaction to pegfilgrastim, filgrastim, other medications, foods, dyes, or preservatives Pregnant or trying to get  pregnant Breast-feeding How should I use this medication? This medication is for injection under the skin. If you get this medication at home, you will be taught how to prepare and give the pre-filled syringe or how to use the On-body Injector. Refer to the patient Instructions for Use for detailed instructions. Use exactly as directed. Tell your care team immediately if you suspect that the On-body Injector may not have performed as intended or if you suspect the use of the On-body Injector resulted in a missed or partial dose. It is important that you put your used needles and syringes in a special sharps container. Do not put them in a trash can. If you do not have a sharps container, call your pharmacist or care team to get one. Talk to your care team about the use of this medication in children. While this medication may be prescribed for selected conditions, precautions do apply. Overdosage: If you think you have taken too much of this medicine contact a poison control center or emergency room at once. NOTE: This medicine is only for you. Do not share this medicine with others. What if I miss a dose? It is important not to miss your dose. Call your care team if you miss your dose. If you miss a dose due to an On-body Injector failure or leakage, a new dose should be administered as soon as possible using a single prefilled syringe for manual use. What may interact with this medication? Interactions have not been studied. This list may not describe all possible interactions. Give your health care provider a list of all the medicines, herbs, non-prescription drugs, or dietary supplements you use. Also tell   them if you smoke, drink alcohol, or use illegal drugs. Some items may interact with your medicine. What should I watch for while using this medication? Your condition will be monitored carefully while you are receiving this medication. You may need blood work done while you are taking this  medication. Talk to your care team about your risk of cancer. You may be more at risk for certain types of cancer if you take this medication. If you are going to need a MRI, CT scan, or other procedure, tell your care team that you are using this medication (On-Body Injector only). What side effects may I notice from receiving this medication? Side effects that you should report to your care team as soon as possible: Allergic reactions--skin rash, itching, hives, swelling of the face, lips, tongue, or throat Capillary leak syndrome--stomach or muscle pain, unusual weakness or fatigue, feeling faint or lightheaded, decrease in the amount of urine, swelling of the ankles, hands, or feet, trouble breathing High white blood cell level--fever, fatigue, trouble breathing, night sweats, change in vision, weight loss Inflammation of the aorta--fever, fatigue, back, chest, or stomach pain, severe headache Kidney injury (glomerulonephritis)--decrease in the amount of urine, red or dark brown urine, foamy or bubbly urine, swelling of the ankles, hands, or feet Shortness of breath or trouble breathing Spleen injury--pain in upper left stomach or shoulder Unusual bruising or bleeding Side effects that usually do not require medical attention (report to your care team if they continue or are bothersome): Bone pain Pain in the hands or feet This list may not describe all possible side effects. Call your doctor for medical advice about side effects. You may report side effects to FDA at 1-800-FDA-1088. Where should I keep my medication? Keep out of the reach of children. If you are using this medication at home, you will be instructed on how to store it. Throw away any unused medication after the expiration date on the label. NOTE: This sheet is a summary. It may not cover all possible information. If you have questions about this medicine, talk to your doctor, pharmacist, or health care provider.  2023  Elsevier/Gold Standard (2021-02-03 00:00:00)       To help prevent nausea and vomiting after your treatment, we encourage you to take your nausea medication as directed.  BELOW ARE SYMPTOMS THAT SHOULD BE REPORTED IMMEDIATELY: *FEVER GREATER THAN 100.4 F (38 C) OR HIGHER *CHILLS OR SWEATING *NAUSEA AND VOMITING THAT IS NOT CONTROLLED WITH YOUR NAUSEA MEDICATION *UNUSUAL SHORTNESS OF BREATH *UNUSUAL BRUISING OR BLEEDING *URINARY PROBLEMS (pain or burning when urinating, or frequent urination) *BOWEL PROBLEMS (unusual diarrhea, constipation, pain near the anus) TENDERNESS IN MOUTH AND THROAT WITH OR WITHOUT PRESENCE OF ULCERS (sore throat, sores in mouth, or a toothache) UNUSUAL RASH, SWELLING OR PAIN  UNUSUAL VAGINAL DISCHARGE OR ITCHING   Items with * indicate a potential emergency and should be followed up as soon as possible or go to the Emergency Department if any problems should occur.  Please show the CHEMOTHERAPY ALERT CARD or IMMUNOTHERAPY ALERT CARD at check-in to the Emergency Department and triage nurse.  Should you have questions after your visit or need to cancel or reschedule your appointment, please contact [facility] [phone]  and follow the prompts.  Office hours are 8:00 a.m. to 4:30 p.m. Monday - Friday. Please note that voicemails left after 4:00 p.m. may not be returned until the following business day.  We are closed weekends and major holidays.   You have access to a nurse at all times for urgent questions. Please call the main number to the clinic [phone] and follow the prompts.  For any non-urgent questions, you may also contact your provider using MyChart. We now offer e-Visits for anyone 92 and older to request care online for non-urgent symptoms. For details visit [email or web address].   Also download the MyChart app! Go to the app store, search "MyChart", open the app, select [facility], and log in with your MyChart username and  password.

## 2022-08-31 NOTE — Progress Notes (Signed)
Nicole Santos presents today for injection per the provider's orders.  Neulasta administration without incident; injection site WNL; see MAR for injection details.  Patient tolerated procedure well and without incident. No complaints at this time. Discharged from clinic ambulatory in stable condition. Alert and oriented x 3. F/U with [facility] as scheduled.

## 2022-09-07 ENCOUNTER — Telehealth: Payer: Self-pay | Admitting: Oncology

## 2022-09-07 NOTE — Telephone Encounter (Signed)
Called Nicole Santos and rescheduled her appointment with Dr. Pricilla Holm to 09/14/22 at 8:00.  She has a dentist apt to have a crown at 10:30 in [place] so she wanted the earliest possible appointment.

## 2022-09-11 ENCOUNTER — Telehealth (INDEPENDENT_AMBULATORY_CARE_PROVIDER_SITE_OTHER): Payer: Commercial Managed Care - PPO | Admitting: Family Medicine

## 2022-09-11 ENCOUNTER — Encounter (INDEPENDENT_AMBULATORY_CARE_PROVIDER_SITE_OTHER): Payer: Commercial Managed Care - PPO | Admitting: Family Medicine

## 2022-09-11 ENCOUNTER — Encounter: Payer: Self-pay | Admitting: Family Medicine

## 2022-09-11 DIAGNOSIS — B9689 Other specified bacterial agents as the cause of diseases classified elsewhere: Secondary | ICD-10-CM

## 2022-09-11 DIAGNOSIS — J208 Acute bronchitis due to other specified organisms: Secondary | ICD-10-CM

## 2022-09-11 MED ORDER — BENZONATATE 100 MG PO CAPS: 100.0000 mg | ORAL_CAPSULE | Freq: Three times a day (TID) | ORAL | 0 refills | Status: DC | PRN

## 2022-09-11 MED ORDER — FLUCONAZOLE 150 MG PO TABS: 150.0000 mg | ORAL_TABLET | Freq: Once | ORAL | 0 refills | Status: AC

## 2022-09-11 MED ORDER — CEFDINIR 300 MG PO CAPS: 300.0000 mg | ORAL_CAPSULE | Freq: Two times a day (BID) | ORAL | 0 refills | Status: DC

## 2022-09-11 NOTE — Telephone Encounter (Signed)

## 2022-09-11 NOTE — Progress Notes (Signed)
Could not get video to connect on her phone despite multiple attempts.  She is going to message through [facility] and we will conduct her visit through that.

## 2022-09-12 ENCOUNTER — Other Ambulatory Visit: Payer: Self-pay | Admitting: Gynecologic Oncology

## 2022-09-12 ENCOUNTER — Telehealth: Payer: Self-pay | Admitting: Oncology

## 2022-09-12 DIAGNOSIS — C579 Malignant neoplasm of female genital organ, unspecified: Secondary | ICD-10-CM

## 2022-09-12 NOTE — Progress Notes (Unsigned)
Gynecologic Oncology Return Clinic Visit  09/13/22  Reason for Visit: treatment planning  Treatment History: Oncology History  Primary peritoneal carcinomatosis  11/13/2021 Imaging   CT A/P: 1. Trace ascites throughout the abdomen and pelvis, with extensive peritoneal thickening, nodularity, and omental caking. Findings are consistent with widespread peritoneal and omental metastatic disease. 2. No evidence of primary malignancy in the chest, abdomen, or pelvis, with specific attention to the most likely etiologies of peritoneal metastatic disease including pancreatic, ovarian, and endometrial malignancy. Although there is no obvious mass, pelvic ultrasound may be helpful to more closely assess the female reproductive organs. 3. Small left pleural effusion and associated atelectasis or consolidation, nonspecific although worrisome for malignant effusion. No overt pleural thickening or nodularity. 4. Scattered areas of nonspecific infectious or inflammatory alveolar ground-glass airspace opacity throughout the lungs. 5. Cholelithiasis without evidence of acute cholecystitis. 6. Nonobstructive right nephrolithiasis. 7. Postoperative findings of same day laparoscopy including minimal pneumoperitoneum.   Aortic Atherosclerosis (ICD10-I70.0).   11/13/2021 Surgery   Diagnostic laparoscopy with biopsy Dr. Lovell Sheehan Findings: Significant bloody ascites, studding of the omentum as well as the upper abdominal wall and diaphragm.  Nodularity noted along the small intestine.  Omental disease inhibited view of the pelvis.   11/17/2021 Initial Diagnosis   Primary peritoneal carcinomatosis (HCC)   11/21/2021 - 01/25/2022 Chemotherapy   Patient is on Treatment Plan : OVARIAN Carboplatin (AUC 6) / Paclitaxel (175) q21d x 6 cycles     11/21/2021 -  Chemotherapy   Patient is on Treatment Plan : OVARIAN Carboplatin (AUC 6) + Paclitaxel (175) q21d X 6 Cycles     01/19/2022 Imaging   CT A/P: 1.  Small amount of adherent thrombus or fibrin sheath around the catheter in the LEFT brachiocephalic vein. 2. Small low-attenuation area in the inter lobar fissure region associated with medial segment of LEFT hepatic lobe, new from previous imaging, may represent developing "variable fat deposition" though given history of peritoneal disease would suggest attention on subsequent imaging. 3. Diminished thickening of mesenteric reflections, decreased ascites and decreased omental infiltration since previous imaging. 4. Focal concentric narrowing of the rectosigmoid junction is nonspecific but suspicious for neoplasm potentially related to serosal involvement in the pelvis in this patient with known peritoneal disease from ovarian cancer. Underlying colonic neoplasm would be difficult to exclude as well. Colonic assessment with endoscopy or comparison with recent endoscopy performed is suggested. 5. Cholelithiasis without evidence of acute cholecystitis. 6. 4 mm RIGHT lower pole renal calculus.   Aortic Atherosclerosis (ICD10-I70.0).   03/06/2022 Imaging   CT C/A/P: Chest CT:   Scattered subsegmental pulmonary emboli in the right lung.   Abdominal CT:   1. Mild right hydroureteronephrosis from a 4 mm UVJ calculus. 2. Unchanged omental disease. 3. Cholelithiasis and other chronic findings described above.   05/24/2022 Imaging   Increased peritoneal carcinomatosis and mild ascites.   New moderate distal small bowel obstruction, with transition point in the central pelvis adjacent to the uterine fundus, which may be due to peritoneal carcinomatosis or adhesion.   Cholelithiasis. No radiographic evidence of cholecystitis.   Aortic Atherosclerosis (ICD10-I70.0).   08/21/2022 Imaging   CT A/P: 1. Improved peritoneal carcinomatosis and mild ascites. 2. Prominent/mildly enlarged loops of small bowel throughout the abdomen with transition point in the pelvis, not  significantly changed from prior and compatible with a least low-grade obstruction. 3. Cholelithiasis without findings of acute cholecystitis.    Received cycle 9 of Carbo/taxol on 08/29/22  Interval History: Patient reports overall  doing very well.  [name] is a good appetite without nausea or emesis.  She continues to have normal bowel function which she describes as several soft bowel movements a day.  Denies any issues with constipation.  Denies any recent changes to her bowel function.  She denies any urinary symptoms.  Denies any vaginal bleeding or discharge.  Is voiding without difficulty.  She continues on her Eliquis twice a day without any increased bleeding.  Past Medical/Surgical History: Past Medical History:  Diagnosis Date   Acute respiratory disease due to COVID-19 virus 02/02/2019   Anxiety    Cancer    Depression    Dyspnea    Dysrhythmia    History of diabetes mellitus    Resolved in Sept 2023 with weight loss.   Hypertension    Ovarian cancer 11/14/2021   Peritoneal carcinomatosis    Pneumonia    Sleep apnea    doesn't use CPAP   Tachycardia     Past Surgical History:  Procedure Laterality Date   KNEE SURGERY Right    Arthroscopic   LAPAROSCOPIC ABDOMINAL EXPLORATION N/A 11/13/2021   Procedure: Exploratory laparoscopy;  Surgeon: Franky MachoJenkins, Mark, MD;  Location: [facility];  Service: General;  Laterality: N/A;   PORTACATH PLACEMENT Left 11/15/2021   Procedure: INSERTION PORT-A-CATH;  Surgeon: Franky MachoJenkins, Mark, MD;  Location: [facility];  Service: General;  Laterality: Left;   TONSILLECTOMY AND ADENOIDECTOMY      Family History  Problem Relation Age of Onset   Hypertension Mother    Bladder Cancer Maternal Grandmother    Breast cancer Maternal Great-grandmother    Colon cancer Neg Hx    Ovarian cancer Neg Hx    Endometrial cancer Neg Hx    Pancreatic cancer Neg Hx    Prostate cancer Neg Hx     Social History   Socioeconomic History   Marital status: Married     Spouse name: Not on file   Number of children: Not on file   Years of education: Not on file   Highest education level: Not on file  Occupational History   Occupation: Unemployed  Tobacco Use   Smoking status: Never   Smokeless tobacco: Never  Vaping Use   Vaping Use: Never used  Substance and Sexual Activity   Alcohol use: No   Drug use: No   Sexual activity: Not Currently  Other Topics Concern   Not on file  Social History Narrative   Not on file   Social Determinants of Health   Financial Resource Strain: Not on file  Food Insecurity: No Food Insecurity (03/06/2022)   Hunger Vital Sign    Worried About Running Out of Food in the Last Year: Never true    Ran Out of Food in the Last Year: Never true  Transportation Needs: No Transportation Needs (03/06/2022)   PRAPARE - Administrator, Civil ServiceTransportation    Lack of Transportation (Medical): No    Lack of Transportation (Non-Medical): No  Physical Activity: Not on file  Stress: Not on file  Social Connections: Not on file    Current Medications:  Current Outpatient Medications:    apixaban (ELIQUIS) 5 MG TABS tablet, Take 1 tablet (5 mg total) by mouth 2 (two) times daily., Disp: 180 tablet, Rfl: 0   atorvastatin (LIPITOR) 20 MG tablet, Take 1 tablet (20 mg total) by mouth daily. (Patient taking differently: Take 20 mg by mouth at bedtime.), Disp: 90 tablet, Rfl: 1   benzonatate (TESSALON PERLES) 100 MG capsule, Take 1 capsule (  100 mg total) by mouth 3 (three) times daily as needed., Disp: 20 capsule, Rfl: 0   cefdinir (OMNICEF) 300 MG capsule, Take 1 capsule (300 mg total) by mouth 2 (two) times daily. 1 po BID, Disp: 20 capsule, Rfl: 0   diphenoxylate-atropine (LOMOTIL) 2.5-0.025 MG tablet, Take 2 tablets by mouth after the first watery bowel movement. May then take 1 pill by mouth after each watery bowel movement. Do not exceed 8 pills in a 24 hour period., Disp: 60 tablet, Rfl: 3   DULoxetine (CYMBALTA) 30 MG capsule, Take 1 capsule (30  mg total) by mouth daily. Take 1 capsule by mouth x 7 days, then increase to 2 capsules daily, Disp: 90 capsule, Rfl: 3   magnesium oxide (MAG-OX) 400 (240 Mg) MG tablet, TAKE 1 TABLET BY MOUTH TWICE A DAY, Disp: 60 tablet, Rfl: 2   Multiple Vitamin (MULTIVITAMIN) capsule, Take 1 capsule by mouth daily., Disp: , Rfl:    Potassium 99 MG TABS, Take 99 mg by mouth at bedtime., Disp: , Rfl:    prochlorperazine (COMPAZINE) 10 MG tablet, Take 1 tablet (10 mg total) by mouth every 6 (six) hours as needed for nausea or vomiting., Disp: 30 tablet, Rfl: 3 No current facility-administered medications for this visit.  Facility-Administered Medications Ordered in Other Visits:    heparin lock flush 100 unit/mL, 500 Units, Intravenous, Once, [name], MD   sodium chloride flush (NS) 0.9 % injection 10 mL, 10 mL, Intravenous, PRN, [name], MD  Review of Systems: Denies appetite changes, fevers, chills, fatigue, unexplained weight changes. Denies hearing loss, neck lumps or masses, mouth sores, ringing in ears or voice changes. Denies cough or wheezing.  Denies shortness of breath. Denies chest pain or palpitations. Denies leg swelling. Denies abdominal distention, pain, blood in stools, constipation, diarrhea, nausea, vomiting, or early satiety. Denies pain with intercourse, dysuria, frequency, hematuria or incontinence. Denies hot flashes, pelvic pain, vaginal bleeding or vaginal discharge.   Denies joint pain, back pain or muscle pain/cramps. Denies itching, rash, or wounds. Denies dizziness, headaches, numbness or seizures. Denies swollen lymph nodes or glands, denies easy bruising or bleeding. Denies anxiety, depression, confusion, or decreased concentration.  Physical Exam: BP 120/64 (BP Location: Left Arm, Patient Position: Sitting)   Pulse (!) 103   Temp 98.9 F (37.2 C) (Oral)   Wt 189 lb 9.6 oz (86 kg)   SpO2 100%   BMI 34.68 kg/m  General: Alert, oriented, no  acute distress. HEENT: Normocephalic, atraumatic, sclera anicteric. Chest: Unlabored breathing on room air.  Laboratory & Radiologic Studies: Component Ref Range & Units 1 mo ago 2 mo ago 3 mo ago 4 mo ago 5 mo ago 7 mo ago 8 mo ago  Cancer Antigen (CA) 125 0.0 - 38.1 U/mL 11.6 16.0 CM 20.4 CM 13.4 CM 27.4 CM 44.3 High  CM 322.0 High  C    Assessment & Plan: Nicole Santos is a 52 y.o. woman with stage IVA gynecologic malignancy, presumed to be primary peritoneal versus uterine.  Patient presents today to discuss interval debulking surgery.  She has now received 9 cycles of neoadjuvant chemotherapy with normalization of her CA125 and improvement in peritoneal disease on CT scan.  From a performance status standpoint, she is doing quite well and is looking/feeling better than I have ever seen her.  She and I have discussed interval debulking surgery on prior occasions with regard to her radiation history.  Today I spent significant time reviewing recent CT findings  with her.  We looked at the images together.  On her last several CT scans, there is a loop of small bowel that appears to be tethered in the pelvis, possibly to the uterus.  Likely, this is not causing any obstructive symptoms, but I worry that this piece of bowel may be fixed.  Given radiation history, mobilizing this bowel may cause injury and require resection with 3 anastomosis.  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  I stressed again the changes that radiation can cause with regard to tissue integrity as well as difficulty from a healing standpoint.  Previously, the patient has voiced to me that she would like his minimally invasive and approach as possible for surgery.  She also has been adamant against wanting an ostomy.  In terms of moving forward, I offered several options today.  The first would be to proceed with interval  debulking surgery.  If she is interested in this, my recommendation is that this be done at [facility] given may be necessary at the time of surgery and complexity of postoperative care.  The second option would be to take a chemo break and reassess disease status in 3 months with imaging.  The third would be to transition from carboplatin Taxol to maintenance therapy.  After discussing these options, she is wanting to move forward with surgery.  I recommended that we start with a diagnostic laparoscopy to see if minimally invasive surgery is feasible.  Goal would be resection of gross visible disease.  I worry that debulking of her pelvic organs run several risks.  I think that it would likely require mobilization of potentially tethered small bowel to access her pelvic anatomy.  This could lead to the need for surgical repair of enterotomy.  I think most likely removal of the tubes and ovaries would be feasible without significant risk of injury to her colon.  If the rectum is adherent posteriorly to the uterus, then hysterectomy would likely require rectosigmoid resection.  In the setting of her prior radiation, reanastomosis would likely be attempted but I would recommend temporary diverting ileostomy.  I have asked the patient to think about what would and would not be acceptable to her at the time of surgery so that given findings at the time of surgery, I can make decisions about procedures performed that would be acceptable to her.  Plan for surgery: diagnostic laparoscopy, possible open versus robotic tumor debulking including omentectomy, fulguration of peritoneal implants, possible bilateral salpingo-oophorectomy, possible total hysterectomy, possible bowel resection, and any other indicated procedures.   I reached out to the surgical scheduler at [facility] regarding surgery on 4/29 or 4/30 with plan to get the patient in for a preoperative visit at [facility] on 4/16.   36 minutes of total time was spent for this  patient encounter, including preparation, face-to-face counseling with the patient and coordination of care, and documentation of the encounter.  Eugene Garnet, MD  Division of Gynecologic Oncology  Department of Obstetrics and Gynecology  [facility]

## 2022-09-12 NOTE — Telephone Encounter (Signed)
Called Nicole Santos and advised her that we need to reschedule her appointment with Dr. Pricilla Holm to tomorrow, 09/13/22 around 2:00.  Advised her that this time may change based on cancellations and that we will confirm the appointment this afternoon.

## 2022-09-13 ENCOUNTER — Inpatient Hospital Stay: Payer: Commercial Managed Care - PPO | Attending: Gynecologic Oncology | Admitting: Gynecologic Oncology

## 2022-09-13 ENCOUNTER — Other Ambulatory Visit: Payer: Self-pay

## 2022-09-13 ENCOUNTER — Encounter: Payer: Self-pay | Admitting: Gynecologic Oncology

## 2022-09-13 ENCOUNTER — Telehealth: Payer: Self-pay | Admitting: *Deleted

## 2022-09-13 VITALS — BP 120/64 | HR 103 | Temp 98.9°F | Wt 189.6 lb

## 2022-09-13 DIAGNOSIS — C579 Malignant neoplasm of female genital organ, unspecified: Secondary | ICD-10-CM | POA: Insufficient documentation

## 2022-09-13 DIAGNOSIS — Z8541 Personal history of malignant neoplasm of cervix uteri: Secondary | ICD-10-CM

## 2022-09-13 DIAGNOSIS — Z9221 Personal history of antineoplastic chemotherapy: Secondary | ICD-10-CM | POA: Diagnosis not present

## 2022-09-13 DIAGNOSIS — K56699 Other intestinal obstruction unspecified as to partial versus complete obstruction: Secondary | ICD-10-CM

## 2022-09-13 DIAGNOSIS — Z7189 Other specified counseling: Secondary | ICD-10-CM

## 2022-09-13 DIAGNOSIS — C800 Disseminated malignant neoplasm, unspecified: Secondary | ICD-10-CM

## 2022-09-13 DIAGNOSIS — Z923 Personal history of irradiation: Secondary | ICD-10-CM | POA: Insufficient documentation

## 2022-09-13 NOTE — Patient Instructions (Addendum)
It was good to see you today.  I am working with the surgery scheduler at [facility] to hopefully get the surgery booked for the afternoon of 4/29.  I have also asked that they get you scheduled to come see me next Tuesday afternoon, the 16th, for a preoperative visit there.  Between now and then, please think about everything that we discussed in terms of surgery.  I will plan to start with looking on the inside with a camera and we will try to do as much as I can through small incisions.  There is a risk that any surgery will require a bigger incision.  We also discussed the risk today of injury to your small or large bowel requiring removal of part of each structure and the risk of needing a temporary or permanent ostomy/bag.

## 2022-09-13 NOTE — Telephone Encounter (Signed)
Per Dr Pricilla Holm fax surgical optimization form to patient's PCP for eliquis recommendations

## 2022-09-13 NOTE — Patient Instructions (Addendum)
DUE TO COVID-19 ONLY TWO VISITORS  (aged 54 and older)  ARE ALLOWED TO COME WITH YOU AND STAY IN THE WAITING ROOM ONLY DURING PRE OP AND PROCEDURE.   **NO VISITORS ARE ALLOWED IN THE SHORT STAY AREA OR RECOVERY ROOM!!**  IF YOU WILL BE ADMITTED INTO THE HOSPITAL YOU ARE ALLOWED ONLY FOUR SUPPORT PEOPLE DURING VISITATION HOURS ONLY (7 AM -8PM)   The support person(s) must pass our screening, gel in and out, and wear a mask at all times, including in the patient's room. Patients must also wear a mask when staff or their support person are in the room. Visitors GUEST BADGE MUST BE WORN VISIBLY  One adult visitor may remain with you overnight and MUST be in the room by 8 P.M.     Your procedure is scheduled on: 09/26/22   Report to [facility] Main Entrance    Report to admitting at : 9:45 AM   Call this number if you have problems the morning of surgery [phone]   Clear liquids starting the day before surgery until : 9:00 AM DAY OF SURGERY  Water Black Coffee (sugar ok, NO MILK/CREAM OR CREAMERS)  Tea (sugar ok, NO MILK/CREAM OR CREAMERS) regular and decaf                             Plain Jell-O (NO RED)                                           Fruit ices (not with fruit pulp, NO RED)                                     Popsicles (NO RED)                                                                  Juice: apple, WHITE grape, WHITE cranberry Sports drinks like Gatorade (NO RED)              Drink 2 Ensure/G2 drinks AT 10:00 PM the night before surgery.     The day of surgery:  Drink ONE (1) Pre-Surgery Clear Ensure at : 9:00AM the morning of surgery. Drink in one sitting. Do not sip.  This drink was given to you during your hospital  pre-op appointment visit. Nothing else to drink after completing the  Pre-Surgery Clear Ensure or G2.          If you have questions, please contact your surgeon's office.  FOLLOW BOWEL PREP AND ANY ADDITIONAL PRE OP INSTRUCTIONS YOU  RECEIVED FROM YOUR SURGEON'S OFFICE!!! Pt to have bowel prep. Clear liquids after midnight until 3 hours before surgery. Finish prescribed drink (Pre-Surgery Ensure or G2 if patient is diabetic) by 3 hours before surgery. NPO except meds with a sip of water starting 3 hours before surgery. DAY PRIOR TO SURGERY Switch to a full liquid diet the day before surgery Drink plenty of liquids all day to avoid getting dehydrated 7:00 am Swallow 4 dulcolax tablets with  some water 10:00 am Mix the bottle of Miralax with the 64 ounces of Gatorade (G 2 or powerade zero) Drink the Gatorade/Miralax mixture gradually (8 oz glass every 15 minutes) until gone. (You should finish in 4 hours) 2:00pm Take 2 Flagyl (total of 1000 mg) tablets 3:00pm Take 2 Flagyl (total of 1000 mg) tablets Drink plenty of clear liquids all evening to avoid getting dehydrated 10:00pm Take 2 Flagyl (total of 1000 mg) tablets Drink 2 Carbohydrate loading nutrition drinks (ex: Ensure Presurgery). These will be given at pre-op appointment. Do not eat anything solid after bedtime (midnight) the night before your surgery. BUT DO drink plenty of clear liquids (Water, Gatorade, juice, soda, coffee, tea, broths, etc.) up to 3 hours prior to surgery to avoid getting dehydrated., Pre-op   Oral Hygiene is also important to reduce your risk of infection.                                    Remember - BRUSH YOUR TEETH THE MORNING OF SURGERY WITH YOUR REGULAR TOOTHPASTE  DENTURES WILL BE REMOVED PRIOR TO SURGERY PLEASE DO NOT APPLY "Poly grip" OR ADHESIVES!!!   Do NOT smoke after Midnight   Take these medicines the morning of surgery with A SIP OF WATER: duloxetine.  DO NOT TAKE ANY ORAL DIABETIC MEDICATIONS DAY OF YOUR SURGERY  Bring CPAP mask and tubing day of surgery.                              You may not have any metal on your body including hair pins, jewelry, and body piercing             Do not wear make-up, lotions, powders, perfumes/cologne,  or deodorant  Do not wear nail polish including gel and S&S, artificial/acrylic nails, or any other type of covering on natural nails including finger and toenails. If you have artificial nails, gel coating, etc. that needs to be removed by a nail salon please have this removed prior to surgery or surgery may need to be canceled/ delayed if the surgeon/ anesthesia feels like they are unable to be safely monitored.   Do not shave  48 hours prior to surgery.    Do not bring valuables to the hospital. [facility] IS NOT             RESPONSIBLE   FOR VALUABLES.   Contacts, glasses, or bridgework may not be worn into surgery.   Bring small overnight bag day of surgery.   DO NOT BRING YOUR HOME MEDICATIONS TO THE HOSPITAL. PHARMACY WILL DISPENSE MEDICATIONS LISTED ON YOUR MEDICATION LIST TO YOU DURING YOUR ADMISSION IN THE HOSPITAL!    Patients discharged on the day of surgery will not be allowed to drive home.  Someone NEEDS to stay with you for the first 24 hours after anesthesia.   Special Instructions: Bring a copy of your healthcare power of attorney and living will documents         the day of surgery if you haven't scanned them before.              Please read over the following fact sheets you were given: IF YOU HAVE QUESTIONS ABOUT YOUR PRE-OP INSTRUCTIONS PLEASE CALL [phone]    [facility] - Preparing for Surgery Before surgery, you can play an important role.  Because  skin is not sterile, your skin needs to be as free of germs as possible.  You can reduce the number of germs on your skin by washing with CHG (chlorahexidine gluconate) soap before surgery.  CHG is an antiseptic cleaner which kills germs and bonds with the skin to continue killing germs even after washing. Please DO NOT use if you have an allergy to CHG or antibacterial soaps.  If your skin becomes reddened/irritated stop using the CHG and inform your nurse when you arrive at Short Stay. Do not shave (including legs  and underarms) for at least 48 hours prior to the first CHG shower.  You may shave your face/neck. Please follow these instructions carefully:  1.  Shower with CHG Soap the night before surgery and the  morning of Surgery.  2.  If you choose to wash your hair, wash your hair first as usual with your  normal  shampoo.  3.  After you shampoo, rinse your hair and body thoroughly to remove the  shampoo.                           4.  Use CHG as you would any other liquid soap.  You can apply chg directly  to the skin and wash                       Gently with a scrungie or clean washcloth.  5.  Apply the CHG Soap to your body ONLY FROM THE NECK DOWN.   Do not use on face/ open                           Wound or open sores. Avoid contact with eyes, ears mouth and genitals (private parts).                       Wash face,  Genitals (private parts) with your normal soap.             6.  Wash thoroughly, paying special attention to the area where your surgery  will be performed.  7.  Thoroughly rinse your body with warm water from the neck down.  8.  DO NOT shower/wash with your normal soap after using and rinsing off  the CHG Soap.                9.  Pat yourself dry with a clean towel.            10.  Wear clean pajamas.            11.  Place clean sheets on your bed the night of your first shower and do not  sleep with pets. Day of Surgery : Do not apply any lotions/deodorants the morning of surgery.  Please wear clean clothes to the hospital/surgery center.  FAILURE TO FOLLOW THESE INSTRUCTIONS MAY RESULT IN THE CANCELLATION OF YOUR SURGERY PATIENT SIGNATURE_________________________________  NURSE SIGNATURE__________________________________  ________________________________________________________________________ WHAT IS A BLOOD TRANSFUSION? Blood Transfusion Information  A transfusion is the replacement of blood or some of its parts. Blood is made up of multiple cells which provide different  functions. Red blood cells carry oxygen and are used for blood loss replacement. White blood cells fight against infection. Platelets control bleeding. Plasma helps clot blood. Other blood products are available for specialized needs, such as hemophilia or other clotting disorders. BEFORE  THE TRANSFUSION  Who gives blood for transfusions?  Healthy volunteers who are fully evaluated to make sure their blood is safe. This is blood bank blood. Transfusion therapy is the safest it has ever been in the practice of medicine. Before blood is taken from a donor, a complete history is taken to make sure that person has no history of diseases nor engages in risky social behavior (examples are intravenous drug use or sexual activity with multiple partners). The donor's travel history is screened to minimize risk of transmitting infections, such as malaria. The donated blood is tested for signs of infectious diseases, such as HIV and hepatitis. The blood is then tested to be sure it is compatible with you in order to minimize the chance of a transfusion reaction. If you or a relative donates blood, this is often done in anticipation of surgery and is not appropriate for emergency situations. It takes many days to process the donated blood. RISKS AND COMPLICATIONS Although transfusion therapy is very safe and saves many lives, the main dangers of transfusion include:  Getting an infectious disease. Developing a transfusion reaction. This is an allergic reaction to something in the blood you were given. Every precaution is taken to prevent this. The decision to have a blood transfusion has been considered carefully by your caregiver before blood is given. Blood is not given unless the benefits outweigh the risks. AFTER THE TRANSFUSION Right after receiving a blood transfusion, you will usually feel much better and more energetic. This is especially true if your red blood cells have gotten low (anemic). The  transfusion raises the level of the red blood cells which carry oxygen, and this usually causes an energy increase. The nurse administering the transfusion will monitor you carefully for complications. HOME CARE INSTRUCTIONS  No special instructions are needed after a transfusion. You may find your energy is better. Speak with your caregiver about any limitations on activity for underlying diseases you may have. SEEK MEDICAL CARE IF:  Your condition is not improving after your transfusion. You develop redness or irritation at the intravenous (IV) site. SEEK IMMEDIATE MEDICAL CARE IF:  Any of the following symptoms occur over the next 12 hours: Shaking chills. You have a temperature by mouth above 102 F (38.9 C), not controlled by medicine. Chest, back, or muscle pain. People around you feel you are not acting correctly or are confused. Shortness of breath or difficulty breathing. Dizziness and fainting. You get a rash or develop hives. You have a decrease in urine output. Your urine turns a dark color or changes to pink, red, or brown. Any of the following symptoms occur over the next 10 days: You have a temperature by mouth above 102 F (38.9 C), not controlled by medicine. Shortness of breath. Weakness after normal activity. The white part of the eye turns yellow (jaundice). You have a decrease in the amount of urine or are urinating less often. Your urine turns a dark color or changes to pink, red, or brown. Document Released: 05/18/2000 Document Revised: 08/13/2011 Document Reviewed: 01/05/2008 [facility] Patient Information 2014 [place], [place].  _______________________________________________________________________

## 2022-09-14 ENCOUNTER — Encounter: Payer: Self-pay | Admitting: Gynecologic Oncology

## 2022-09-14 ENCOUNTER — Inpatient Hospital Stay: Payer: Commercial Managed Care - PPO | Admitting: Gynecologic Oncology

## 2022-09-17 ENCOUNTER — Encounter (HOSPITAL_COMMUNITY)
Admission: RE | Admit: 2022-09-17 | Discharge: 2022-09-17 | Disposition: A | Discharge status: Home / Self Care | Payer: Commercial Managed Care - PPO | Source: Ambulatory Visit | Attending: Anesthesiology | Admitting: Anesthesiology

## 2022-09-18 ENCOUNTER — Encounter: Payer: Self-pay | Admitting: Family Medicine

## 2022-09-18 ENCOUNTER — Ambulatory Visit (INDEPENDENT_AMBULATORY_CARE_PROVIDER_SITE_OTHER): Payer: Commercial Managed Care - PPO | Admitting: Family Medicine

## 2022-09-18 VITALS — BP 117/76 | HR 97 | Temp 97.9°F | Ht 62.0 in | Wt 195.0 lb

## 2022-09-18 DIAGNOSIS — E119 Type 2 diabetes mellitus without complications: Secondary | ICD-10-CM

## 2022-09-18 DIAGNOSIS — Z86711 Personal history of pulmonary embolism: Secondary | ICD-10-CM

## 2022-09-18 DIAGNOSIS — C482 Malignant neoplasm of peritoneum, unspecified: Secondary | ICD-10-CM

## 2022-09-18 DIAGNOSIS — E1169 Type 2 diabetes mellitus with other specified complication: Secondary | ICD-10-CM

## 2022-09-18 DIAGNOSIS — Z01818 Encounter for other preprocedural examination: Secondary | ICD-10-CM | POA: Diagnosis not present

## 2022-09-18 DIAGNOSIS — E785 Hyperlipidemia, unspecified: Secondary | ICD-10-CM

## 2022-09-18 DIAGNOSIS — Z7901 Long term (current) use of anticoagulants: Secondary | ICD-10-CM | POA: Diagnosis not present

## 2022-09-18 LAB — BAYER DCA HB A1C WAIVED: HB A1C (BAYER DCA - WAIVED): 5.5 % (ref 4.8–5.6)

## 2022-09-18 MED ORDER — ATORVASTATIN CALCIUM 20 MG PO TABS: 20.0000 mg | ORAL_TABLET | Freq: Every day | ORAL | 3 refills | Status: DC

## 2022-09-18 MED ORDER — APIXABAN 5 MG PO TABS: 5.0000 mg | ORAL_TABLET | Freq: Two times a day (BID) | ORAL | 3 refills | Status: DC

## 2022-09-18 NOTE — Patient Instructions (Signed)
Cleared for surgery from my standpoint.  Sugar well controlled. Labs per preop provider this pm.  A1c collected today.

## 2022-09-18 NOTE — Progress Notes (Signed)
Subjective: [name] clearance PCP: [name], DO HPI:Nicole Santos is a 52 y.o. female presenting to clinic today for:  1. Surgical clearance for laparoscopic abdominal surgery Patient has planned laparoscopic surgery to remove her fallopian tubes, ovaries and the fat pad that contains a lot of the cancer, peritoneal carcinomatosis, scheduled for the end of the month.  She has preop clearance with [facility] this afternoon.  She denies any history of intolerance to anesthesia.  She is never suffered any postop complications including difficulty arousing or postop nausea and vomiting.  She is anticoagulated for history of pulmonary embolism with Eliquis and she is compliant with this twice daily.  No rectal bleeding.  No abnormal vaginal bleeding or blood in urine.  2.  Type 2 diabetes, diet controlled, associated with hyperlipidemia Patient is compliant with Lipitor 20 mg daily.  Her diabetes has been diet controlled.  She does not report any visual disturbance, polydipsia, polyuria, chest pain or shortness of breath today.   ROS: Per HPI  Allergies  Allergen Reactions   Augmentin [Amoxicillin-Pot Clavulanate] Diarrhea and Nausea And Vomiting   Doxycycline     Abdominal cramps.   Topamax [Topiramate] Nausea And Vomiting   Past Medical History:  Diagnosis Date   Acute respiratory disease due to COVID-19 virus 02/02/2019   Anxiety    Cancer    Depression    Dyspnea    Dysrhythmia    History of diabetes mellitus    Resolved in Sept 2023 with weight loss.   Hypertension    Ovarian cancer 11/14/2021   Peritoneal carcinomatosis    Pneumonia    Sleep apnea    doesn't use CPAP   Tachycardia     Current Outpatient Medications:    apixaban (ELIQUIS) 5 MG TABS tablet, Take 1 tablet (5 mg total) by mouth 2 (two) times daily., Disp: 180 tablet, Rfl: 0   atorvastatin (LIPITOR) 20 MG tablet, Take 1 tablet (20 mg total) by mouth daily. (Patient taking differently: Take 20 mg by  mouth at bedtime.), Disp: 90 tablet, Rfl: 1   benzonatate (TESSALON PERLES) 100 MG capsule, Take 1 capsule (100 mg total) by mouth 3 (three) times daily as needed., Disp: 20 capsule, Rfl: 0   cefdinir (OMNICEF) 300 MG capsule, Take 1 capsule (300 mg total) by mouth 2 (two) times daily. 1 po BID, Disp: 20 capsule, Rfl: 0   diphenoxylate-atropine (LOMOTIL) 2.5-0.025 MG tablet, Take 2 tablets by mouth after the first watery bowel movement. May then take 1 pill by mouth after each watery bowel movement. Do not exceed 8 pills in a 24 hour period., Disp: 60 tablet, Rfl: 3   DULoxetine (CYMBALTA) 30 MG capsule, Take 1 capsule (30 mg total) by mouth daily. Take 1 capsule by mouth x 7 days, then increase to 2 capsules daily, Disp: 90 capsule, Rfl: 3   magnesium oxide (MAG-OX) 400 (240 Mg) MG tablet, TAKE 1 TABLET BY MOUTH TWICE A DAY, Disp: 60 tablet, Rfl: 2   Multiple Vitamin (MULTIVITAMIN) capsule, Take 1 capsule by mouth daily., Disp: , Rfl:    Potassium 99 MG TABS, Take 99 mg by mouth at bedtime., Disp: , Rfl:    prochlorperazine (COMPAZINE) 10 MG tablet, Take 1 tablet (10 mg total) by mouth every 6 (six) hours as needed for nausea or vomiting., Disp: 30 tablet, Rfl: 3 No current facility-administered medications for this visit.  Facility-Administered Medications Ordered in Other Visits:    heparin lock flush 100 unit/mL, 500 Units,  Intravenous, Once, [name], MD   sodium chloride flush (NS) 0.9 % injection 10 mL, 10 mL, Intravenous, PRN, [name], MD Social History   Socioeconomic History   Marital status: Married    Spouse name: Not on file   Number of children: Not on file   Years of education: Not on file   Highest education level: Not on file  Occupational History   Occupation: Unemployed  Tobacco Use   Smoking status: Never   Smokeless tobacco: Never  Vaping Use   Vaping Use: Never used  Substance and Sexual Activity   Alcohol use: No   Drug use: No   Sexual  activity: Not Currently  Other Topics Concern   Not on file  Social History Narrative   Not on file   Social Determinants of Health   Financial Resource Strain: Not on file  Food Insecurity: No Food Insecurity (03/06/2022)   Hunger Vital Sign    Worried About Running Out of Food in the Last Year: Never true    Ran Out of Food in the Last Year: Never true  Transportation Needs: No Transportation Needs (03/06/2022)   PRAPARE - Administrator, Civil Service (Medical): No    Lack of Transportation (Non-Medical): No  Physical Activity: Not on file  Stress: Not on file  Social Connections: Not on file  Intimate Partner Violence: Not At Risk (03/06/2022)   Humiliation, Afraid, Rape, and Kick questionnaire    Fear of Current or Ex-Partner: No    Emotionally Abused: No    Physically Abused: No    Sexually Abused: No   Family History  Problem Relation Age of Onset   Hypertension Mother    Bladder Cancer Maternal Grandmother    Breast cancer Maternal Great-grandmother    Colon cancer Neg Hx    Ovarian cancer Neg Hx    Endometrial cancer Neg Hx    Pancreatic cancer Neg Hx    Prostate cancer Neg Hx     Objective: Office vital signs reviewed. BP 117/76   Pulse 97   Temp 97.9 F (36.6 C)   Ht 5\' 2"  (1.575 m)   Wt 195 lb (88.5 kg)   SpO2 96%   BMI 35.67 kg/m   Physical Examination:  General: Awake, alert, well nourished, No acute distress HEENT: Sclera white.  Moist mucous membranes.  Mallampati 1.  No lymphadenopathy Cardio: regular rate and rhythm, S1S2 heard, no murmurs appreciated Pulm: clear to auscultation bilaterally, no wheezes, rhonchi or rales; normal work of breathing on room air MSK: Able to extend C-spine fully Neuro: See diabetic foot exam below Diabetic Foot Exam - Simple   Simple Foot Form Diabetic Foot exam was performed with the following findings: Yes 09/18/2022 11:57 AM  Visual Inspection No deformities, no ulcerations, no other skin breakdown  bilaterally: Yes Sensation Testing Intact to touch and monofilament testing bilaterally: Yes Pulse Check Posterior Tibialis and Dorsalis pulse intact bilaterally: Yes Comments      Assessment/ Plan: 52 y.o. female   Preop examination - Plan: Bayer DCA Hb A1c Waived, CANCELED: CMP14+EGFR, CANCELED: CBC, CANCELED: Urinalysis  History of pulmonary embolus (PE) - Plan: apixaban (ELIQUIS) 5 MG TABS tablet  Chronic anticoagulation - Plan: CANCELED: CBC  Primary peritoneal carcinomatosis - Plan: apixaban (ELIQUIS) 5 MG TABS tablet, CANCELED: CMP14+EGFR  Diet-controlled diabetes mellitus - Plan: Bayer DCA Hb A1c Waived, Microalbumin / creatinine urine ratio  Hyperlipidemia associated with type 2 diabetes mellitus - Plan: atorvastatin (LIPITOR) 20 MG  tablet  I have independently evaluated patient.  Murriel JAELYNNE HOCKLEY is a 52 y.o. female who is low risk for a high risk surgery.  There are not modifiable risk factors (smoking, etc). Leslyn F Bontrager's RCRI calculation for MACE is: 0.    No barriers to surgery.  Anticipate need to hold Eliquis prior to surgery.  I will defer lab draw to her preop clearance appointment that is scheduled this afternoon but I did go ahead and obtain A1c and urine microalbumin since she had an appointment scheduled with me in 2 weeks for follow-up on diabetes.  This diabetic remains diet controlled with A1c of 5.5 today.  No changes needed.  Refills on both statin and Eliquis sent.  No orders of the defined types were placed in this encounter.  No orders of the defined types were placed in this encounter.    [name], DO [facility] [phone]

## 2022-09-19 ENCOUNTER — Other Ambulatory Visit: Payer: Self-pay

## 2022-09-19 ENCOUNTER — Telehealth: Payer: Self-pay | Admitting: *Deleted

## 2022-09-19 ENCOUNTER — Encounter: Payer: Self-pay | Admitting: Hematology

## 2022-09-19 NOTE — Telephone Encounter (Signed)
Per Dr Pricilla Holm patient scheduled for a post op appt on 6/27, patient aware

## 2022-09-19 NOTE — Telephone Encounter (Signed)
Please advise 

## 2022-09-19 NOTE — Telephone Encounter (Signed)
Clearance received from PCP fax records to [facility]

## 2022-09-20 LAB — MICROALBUMIN / CREATININE URINE RATIO
Creatinine, Urine: 121.8 mg/dL
Microalb/Creat Ratio: <2 mg/g{creat} (ref 0–29)
Microalbumin, Urine: <3 ug/mL

## 2022-09-21 ENCOUNTER — Inpatient Hospital Stay: Payer: Commercial Managed Care - PPO | Admitting: Gynecologic Oncology

## 2022-09-25 ENCOUNTER — Ambulatory Visit: Payer: Commercial Managed Care - PPO | Admitting: Hematology

## 2022-09-25 ENCOUNTER — Other Ambulatory Visit: Payer: Commercial Managed Care - PPO

## 2022-09-25 ENCOUNTER — Ambulatory Visit: Payer: Commercial Managed Care - PPO

## 2022-09-26 ENCOUNTER — Inpatient Hospital Stay: Admit: 2022-09-26 | Payer: Commercial Managed Care - PPO | Admitting: Gynecologic Oncology

## 2022-09-26 DIAGNOSIS — C569 Malignant neoplasm of unspecified ovary: Secondary | ICD-10-CM

## 2022-09-26 DIAGNOSIS — C482 Malignant neoplasm of peritoneum, unspecified: Secondary | ICD-10-CM

## 2022-09-26 SURGERY — HYSTERECTOMY, ABDOMINAL, WITH SALPINGO-OOPHORECTOMY
Anesthesia: General

## 2022-09-27 ENCOUNTER — Ambulatory Visit: Payer: Commercial Managed Care - PPO

## 2022-10-01 IMAGING — US US ABDOMEN COMPLETE
1 series · 13 of 25 positions shown · non-contrast
Comparison: None Available.

CLINICAL DATA: Right upper quadrant abdominal pain

EXAM:
ABDOMEN ULTRASOUND COMPLETE

[Series 1: us abdomen complete · 13 of 205 slices shown]
[im 1/205]
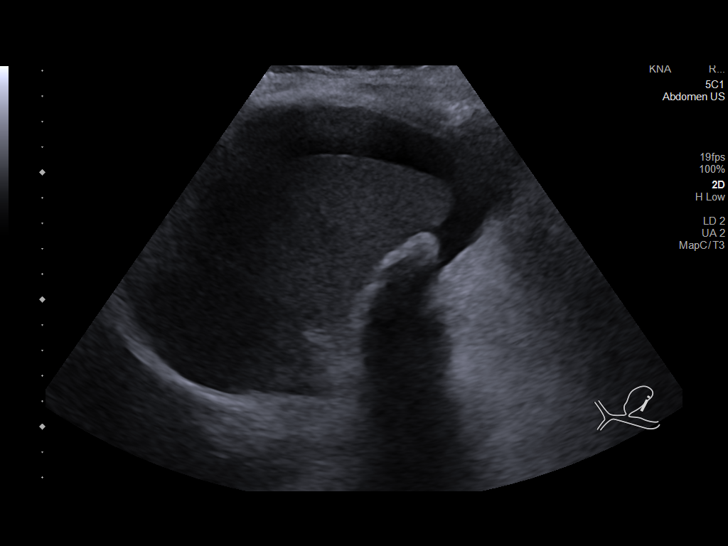
[im 18/205]
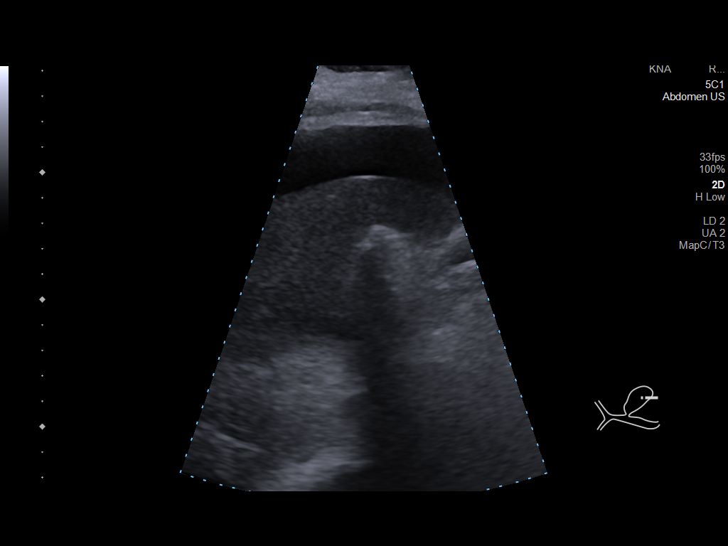
[im 35/205]
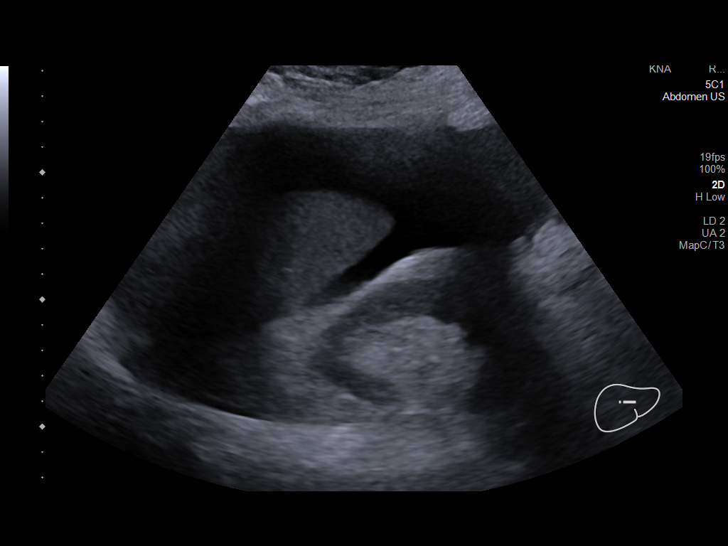
[im 52/205]
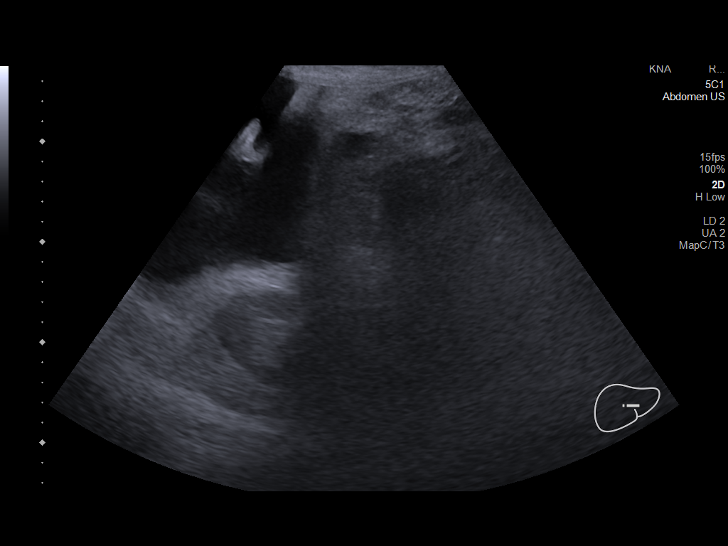
[im 69/205]
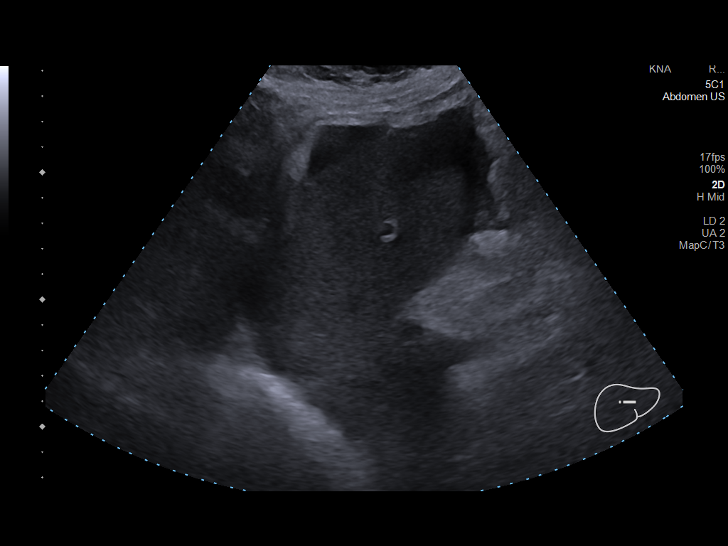
[im 86/205]
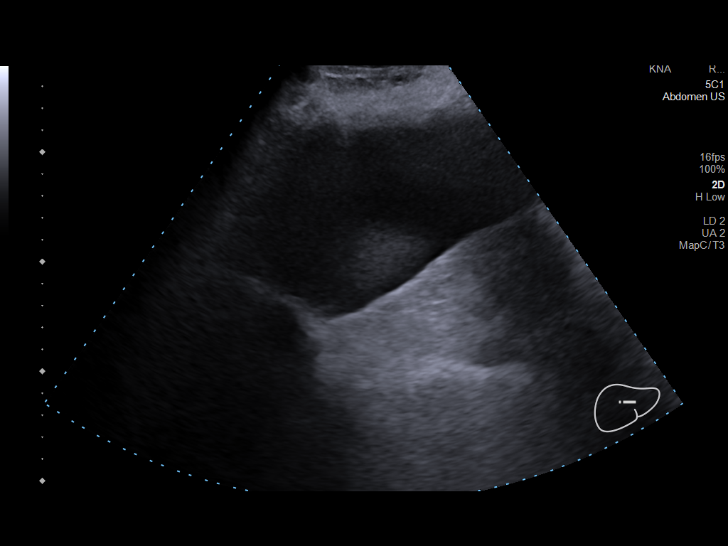
[im 103/205]
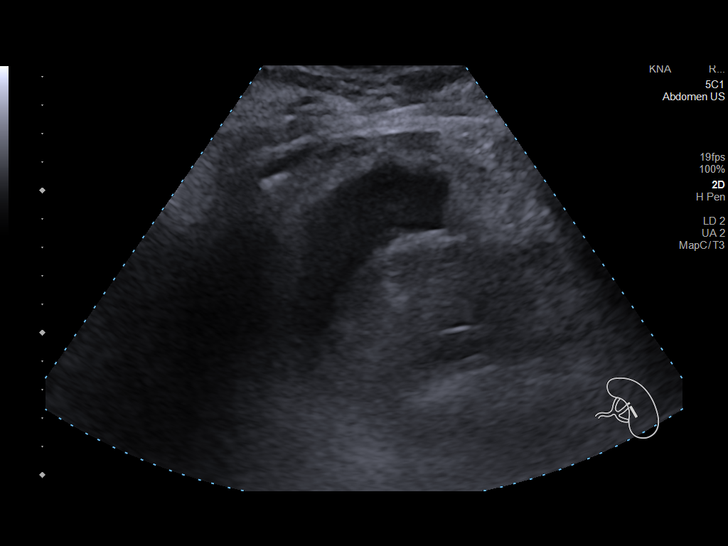
[im 120/205]
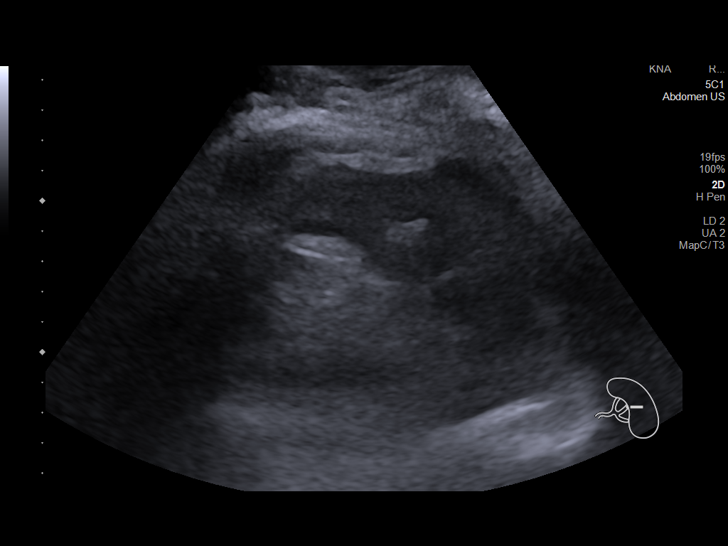
[im 137/205]
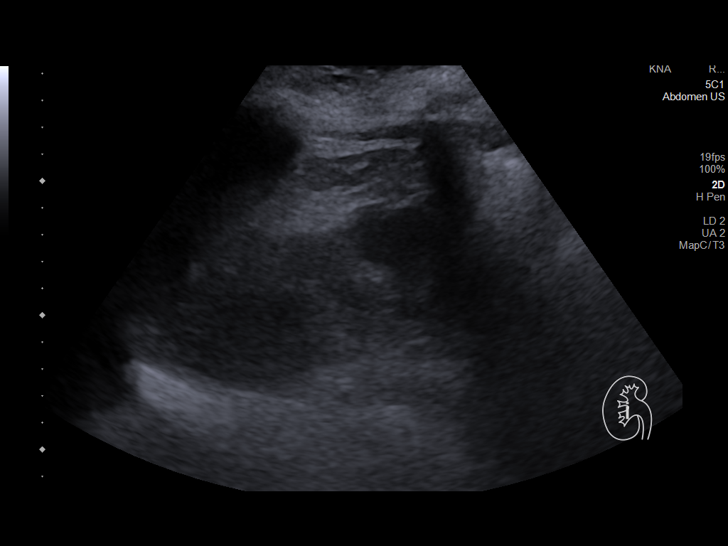
[im 154/205]
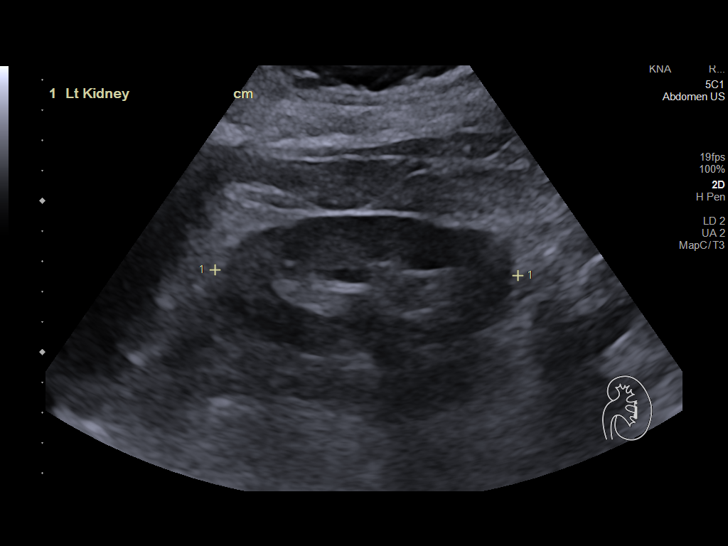
[im 171/205]
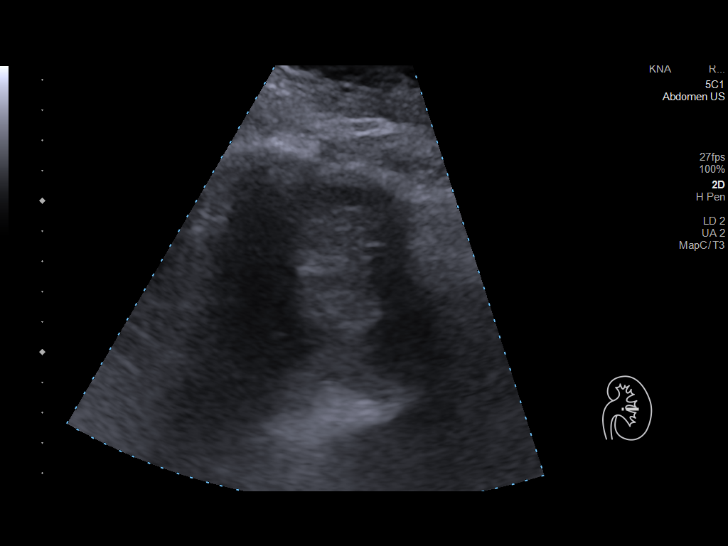
[im 188/205]
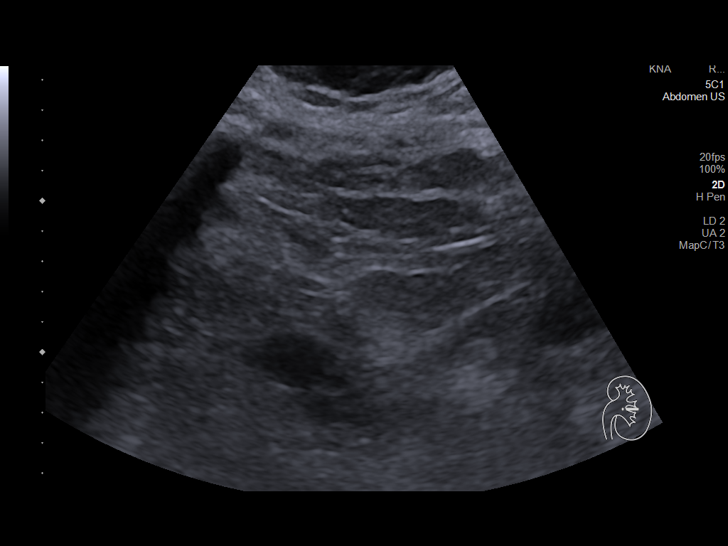
[im 205/205]
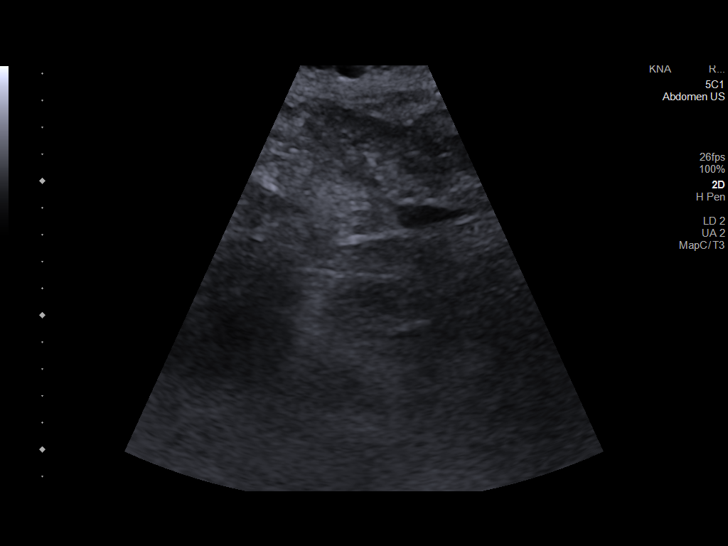

[13 of 25 positions shown; findings below may reference images not displayed]

FINDINGS: Gallbladder: Gallstones filling the gallbladder cause a wall echo
shadow appearance. Gallbladder wall thickened at 0.4 cm. Sonographic
Murphy's sign documented as present by sonographer.

Common bile duct: Diameter: 0.3 cm

Liver: No focal lesion identified. Within normal limits in
parenchymal echogenicity. Portal vein is patent on color Doppler
imaging with normal direction of blood flow towards the liver.

IVC: No abnormality visualized.

Pancreas: Poorly seen due to bowel gas.

Spleen: Size and appearance within normal limits.

Right Kidney: Length: 9.2 cm. Echogenicity within normal limits. No
mass or hydronephrosis visualized.

Left Kidney: Length: 10.0 cm. Echogenicity within normal limits. No
solid mass or hydronephrosis visualized. 2.0 by 1.8 by 1.8 cm
hypoechoic lesion with mildly accentuated through transmission
performed. Image 169 of series [DATE] leads in question whether there
is any internal Doppler signal in this lesion.

Abdominal aorta: No aneurysm visualized.

Other findings: Limitations include body habitus and bowel gas.

Perihepatic and perisplenic ascites.
IMPRESSION: 1. Cholelithiasis with gallbladder wall thickening and sonographic
Murphy sign. Appearance raises suspicion for potential
cholelithiasis, correlate clinically.
2. Upper abdominal ascites.
3. 2.0 cm in diameter hypoechoic lesion in the left kidney is
technically nonspecific because 1 image shows a question of internal
Doppler signal. Otherwise this appears typical for a cyst. Dedicated
renal protocol CT or MRI with and without contrast is recommended
for further characterization in the nonacute setting.

## 2022-10-02 ENCOUNTER — Ambulatory Visit: Payer: Commercial Managed Care - PPO | Admitting: Family Medicine

## 2022-10-04 ENCOUNTER — Telehealth: Payer: Self-pay

## 2022-10-04 NOTE — Transitions of Care (Post Inpatient/ED Visit) (Signed)
   10/04/2022  Name: Nicole Santos MRN: [id number] DOB: [birth year]  Today's TOC FU Call Status: Today's TOC FU Call Status:: Successful TOC FU Call Competed TOC FU Call Complete Date: 10/04/22  Transition Care Management Follow-up Telephone Call Date of Discharge: 10/03/22 Discharge Facility: Other ([facility]) Name of Other ([facility]) Discharge Facility: [facility] Type of Discharge: Inpatient Admission Primary Inpatient Discharge Diagnosis:: neoplasm ovary How have you been since you were released from the hospital?: Better Any questions or concerns?: No  Items Reviewed: Did you receive and understand the discharge instructions provided?: Yes Medications obtained,verified, and reconciled?: Yes (Medications Reviewed) Any new allergies since your discharge?: No Dietary orders reviewed?: Yes Do you have support at home?: Yes People in Home: spouse  Medications Reviewed Today: Medications Reviewed Today     Reviewed by Karena Addison, LPN (Licensed Practical Nurse) on 10/04/22 at 1112  Med List Status: <None>   Medication Order Taking? Sig Documenting Provider Last Dose Status Informant  apixaban (ELIQUIS) 5 MG TABS tablet [id number] Yes Take 1 tablet (5 mg total) by mouth 2 (two) times daily. Delynn Flavin M, DO Taking Active   atorvastatin (LIPITOR) 20 MG tablet [id number] Yes Take 1 tablet (20 mg total) by mouth at bedtime. Delynn Flavin M, DO Taking Active   diphenoxylate-atropine (LOMOTIL) 2.5-0.025 MG tablet [id number] Yes Take 2 tablets by mouth after the first watery bowel movement. May then take 1 pill by mouth after each watery bowel movement. Do not exceed 8 pills in a 24 hour period. [name], MD Taking Active   DULoxetine (CYMBALTA) 30 MG capsule [id number] Yes Take 1 capsule (30 mg total) by mouth daily. Take 1 capsule by mouth x 7 days, then increase to 2 capsules daily [name], MD Taking Active   magnesium oxide (MAG-OX) 400 (240 Mg) MG  tablet [id number] Yes TAKE 1 TABLET BY MOUTH TWICE A Herminio Commons, MD Taking Active   Multiple Vitamin (MULTIVITAMIN) capsule [id number] Yes Take 1 capsule by mouth daily. [provider] Taking Active   Potassium 99 MG TABS [id number] Yes Take 99 mg by mouth at bedtime. [provider] Taking Active Self  prochlorperazine (COMPAZINE) 10 MG tablet [id number] Yes Take 1 tablet (10 mg total) by mouth every 6 (six) hours as needed for nausea or vomiting. [name], MD Taking Active             Home Care and Equipment/Supplies: Were Home Health Services Ordered?: NA Any new equipment or medical supplies ordered?: NA  Functional Questionnaire: Do you need assistance with bathing/showering or dressing?: No Do you need assistance with meal preparation?: No Do you need assistance with eating?: No Do you have difficulty maintaining continence: No Do you need assistance with getting out of bed/getting out of a chair/moving?: No Do you have difficulty managing or taking your medications?: No  Follow up appointments reviewed: PCP Follow-up appointment confirmed?: NA Specialist Hospital Follow-up appointment confirmed?: Yes Date of Specialist follow-up appointment?: 10/25/22 Follow-Up Specialty Provider:: GYN Do you need transportation to your follow-up appointment?: No Do you understand care options if your condition(s) worsen?: Yes-patient verbalized understanding    SIGNATURE Karena Addison, LPN [facility] Nurse Health Advisor Direct Dial [phone]

## 2022-10-08 IMAGING — DX DG CHEST 1V PORT
1 series · 1 of 1 positions shown · non-contrast
Comparison: February 02, 2019 and December 13, 2021.

CLINICAL DATA: A 51-year-old female presents for evaluation of
Port-A-Cath placement.

EXAM:
PORTABLE CHEST 1 VIEW

[chest ap]
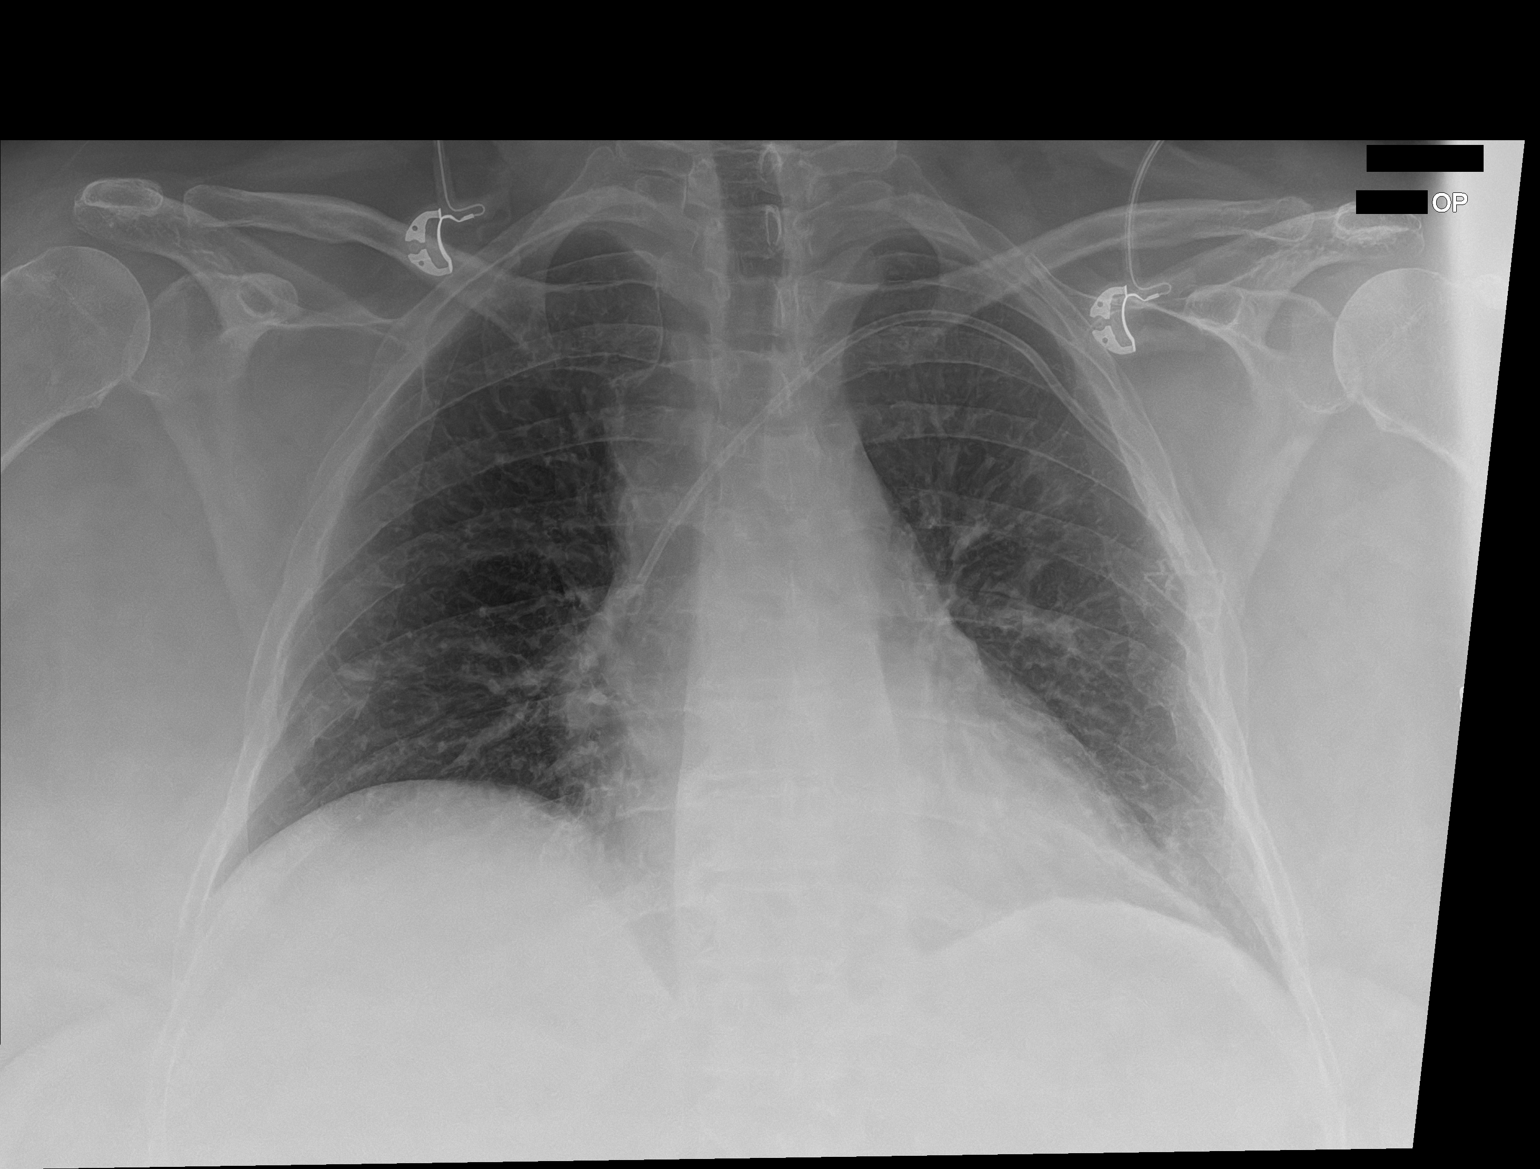

[1 of 1 positions shown; findings below may reference images not displayed]

FINDINGS: Interval placement of LEFT subclavian Port-A-Cath, tip in the mid to
distal superior vena cava.

Cardiomediastinal contours and hilar structures are normal.

No lobar consolidation. No sign of pleural effusion. No visible
pneumothorax.

On limited assessment there is no acute skeletal finding. Signs of
prior trauma to the LEFT hemithorax involving lateral ribs.
IMPRESSION: Interval placement of LEFT subclavian Port-A-Cath, tip in the mid to
distal superior vena cava. No acute cardiopulmonary disease.

## 2022-10-18 ENCOUNTER — Other Ambulatory Visit: Payer: Self-pay | Admitting: Hematology

## 2022-10-25 ENCOUNTER — Other Ambulatory Visit: Payer: Self-pay

## 2022-10-25 ENCOUNTER — Ambulatory Visit: Payer: Commercial Managed Care - PPO

## 2022-10-25 ENCOUNTER — Encounter: Payer: Self-pay | Admitting: Gynecologic Oncology

## 2022-10-25 ENCOUNTER — Other Ambulatory Visit: Payer: Self-pay | Admitting: Genetic Counselor

## 2022-10-25 ENCOUNTER — Inpatient Hospital Stay: Payer: Commercial Managed Care - PPO

## 2022-10-25 ENCOUNTER — Inpatient Hospital Stay: Payer: Commercial Managed Care - PPO | Attending: Gynecologic Oncology | Admitting: Gynecologic Oncology

## 2022-10-25 VITALS — BP 107/74 | HR 101 | Temp 98.6°F | Resp 18 | Ht 62.21 in | Wt 199.0 lb

## 2022-10-25 DIAGNOSIS — Z86711 Personal history of pulmonary embolism: Secondary | ICD-10-CM | POA: Insufficient documentation

## 2022-10-25 DIAGNOSIS — C482 Malignant neoplasm of peritoneum, unspecified: Secondary | ICD-10-CM | POA: Insufficient documentation

## 2022-10-25 DIAGNOSIS — Z9221 Personal history of antineoplastic chemotherapy: Secondary | ICD-10-CM | POA: Insufficient documentation

## 2022-10-25 DIAGNOSIS — Z7189 Other specified counseling: Secondary | ICD-10-CM

## 2022-10-25 DIAGNOSIS — Z803 Family history of malignant neoplasm of breast: Secondary | ICD-10-CM | POA: Insufficient documentation

## 2022-10-25 DIAGNOSIS — C579 Malignant neoplasm of female genital organ, unspecified: Secondary | ICD-10-CM

## 2022-10-25 DIAGNOSIS — Z7901 Long term (current) use of anticoagulants: Secondary | ICD-10-CM | POA: Insufficient documentation

## 2022-10-25 DIAGNOSIS — G629 Polyneuropathy, unspecified: Secondary | ICD-10-CM | POA: Insufficient documentation

## 2022-10-25 DIAGNOSIS — Z8052 Family history of malignant neoplasm of bladder: Secondary | ICD-10-CM | POA: Insufficient documentation

## 2022-10-25 DIAGNOSIS — Z923 Personal history of irradiation: Secondary | ICD-10-CM | POA: Insufficient documentation

## 2022-10-25 DIAGNOSIS — C569 Malignant neoplasm of unspecified ovary: Secondary | ICD-10-CM

## 2022-10-25 DIAGNOSIS — E876 Hypokalemia: Secondary | ICD-10-CM | POA: Insufficient documentation

## 2022-10-25 DIAGNOSIS — Z8541 Personal history of malignant neoplasm of cervix uteri: Secondary | ICD-10-CM

## 2022-10-25 DIAGNOSIS — R197 Diarrhea, unspecified: Secondary | ICD-10-CM | POA: Insufficient documentation

## 2022-10-25 DIAGNOSIS — Z79899 Other long term (current) drug therapy: Secondary | ICD-10-CM | POA: Insufficient documentation

## 2022-10-25 LAB — GENETIC SCREENING ORDER

## 2022-10-25 NOTE — Progress Notes (Signed)
Gynecologic Oncology Return Clinic Visit  10/25/22  Reason for Visit: follow-up, treatment planning  Treatment History: Oncology History  Primary peritoneal carcinomatosis (HCC)  11/13/2021 Imaging   CT A/P: 1. Trace ascites throughout the abdomen and pelvis, with extensive peritoneal thickening, nodularity, and omental caking. Findings are consistent with widespread peritoneal and omental metastatic disease. 2. No evidence of primary malignancy in the chest, abdomen, or pelvis, with specific attention to the most likely etiologies of peritoneal metastatic disease including pancreatic, ovarian, and endometrial malignancy. Although there is no obvious mass, pelvic ultrasound may be helpful to more closely assess the female reproductive organs. 3. Small left pleural effusion and associated atelectasis or consolidation, nonspecific although worrisome for malignant effusion. No overt pleural thickening or nodularity. 4. Scattered areas of nonspecific infectious or inflammatory alveolar ground-glass airspace opacity throughout the lungs. 5. Cholelithiasis without evidence of acute cholecystitis. 6. Nonobstructive right nephrolithiasis. 7. Postoperative findings of same day laparoscopy including minimal pneumoperitoneum.   Aortic Atherosclerosis (ICD10-I70.0).   11/13/2021 Surgery   Diagnostic laparoscopy with biopsy Dr. Lovell Sheehan Findings: Significant bloody ascites, studding of the omentum as well as the upper abdominal wall and diaphragm.  Nodularity noted along the small intestine.  Omental disease inhibited view of the pelvis.   11/17/2021 Initial Diagnosis   Primary peritoneal carcinomatosis (HCC)   11/21/2021 - 01/25/2022 Chemotherapy   Patient is on Treatment Plan : OVARIAN Carboplatin (AUC 6) / Paclitaxel (175) q21d x 6 cycles     11/21/2021 -  Chemotherapy   Patient is on Treatment Plan : OVARIAN Carboplatin (AUC 6) + Paclitaxel (175) q21d X 6 Cycles     01/19/2022 Imaging    CT A/P: 1. Small amount of adherent thrombus or fibrin sheath around the catheter in the LEFT brachiocephalic vein. 2. Small low-attenuation area in the inter lobar fissure region associated with medial segment of LEFT hepatic lobe, new from previous imaging, may represent developing "variable fat deposition" though given history of peritoneal disease would suggest attention on subsequent imaging. 3. Diminished thickening of mesenteric reflections, decreased ascites and decreased omental infiltration since previous imaging. 4. Focal concentric narrowing of the rectosigmoid junction is nonspecific but suspicious for neoplasm potentially related to serosal involvement in the pelvis in this patient with known peritoneal disease from ovarian cancer. Underlying colonic neoplasm would be difficult to exclude as well. Colonic assessment with endoscopy or comparison with recent endoscopy performed is suggested. 5. Cholelithiasis without evidence of acute cholecystitis. 6. 4 mm RIGHT lower pole renal calculus.   Aortic Atherosclerosis (ICD10-I70.0).   03/06/2022 Imaging   CT C/A/P: Chest CT:   Scattered subsegmental pulmonary emboli in the right lung.   Abdominal CT:   1. Mild right hydroureteronephrosis from a 4 mm UVJ calculus. 2. Unchanged omental disease. 3. Cholelithiasis and other chronic findings described above.   05/24/2022 Imaging   Increased peritoneal carcinomatosis and mild ascites.   New moderate distal small bowel obstruction, with transition point in the central pelvis adjacent to the uterine fundus, which may be due to peritoneal carcinomatosis or adhesion.   Cholelithiasis. No radiographic evidence of cholecystitis.   Aortic Atherosclerosis (ICD10-I70.0).   08/21/2022 Imaging   CT A/P: 1. Improved peritoneal carcinomatosis and mild ascites. 2. Prominent/mildly enlarged loops of small bowel throughout the abdomen with transition point in the pelvis, not  significantly changed from prior and compatible with a least low-grade obstruction. 3. Cholelithiasis without findings of acute cholecystitis.   10/01/2022 Surgery   Diagnostic laparoscopy, robotic assisted laparoscopic lysis of adhesions,  exploratory laparotomy, omentectomy   Findings: On EUA, small uterus within minimal mobility. On intra-abdominal entry, omentum adherent to the anterior abdominal wall at the level of the transverse colon. Some adhesions noted between the anterior liver surface and the diaphragm on the right. Small miliary disease noted on bilateral diaphragms, small bowel, small bowel mesentery, abdominal and pelvic peritoneum. No ascites. Complete retroperitonealization of the pelvic organs. Multiple loops of small bowel were densely adherent to the uterine fundus without obvious obstruction. Terminal ileum and cecum also adherent along the right pelvic sidewall. Sigmoid colon densely adherent to a loop of ileum and to the lateral aspect of the retroperitonealized pelvis on the left. After opening the retroperitoneum bilaterally and transecting the round ligaments, the bladder flap was able to be partially developed anteriorly. This revealed dense adhesions of the sigmoid to the lateral uterus. Given prior discussions with the patient, I did not feel that proceeding with further attempt at lysis of adhesions (some mobilization of two loops of ileum adherent to the uterine fundus was performed) would be possible without significant risk of injury to multiple loops of small bowel and to the sigmoid colon. 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 On laparotomy, 8 x 4 cm omental cake noted. No disease visualized or palpated within the lesser sac. There was no palpable disease along the liver edges or spleen. Stomach was normal in appearance. Some miliary disease palpated along the abdominal peritoneum. Numerous miliary disease noted along  the small bowel and small bowel mesentery. Numerous prominent lymph nodes (1cm or less) within the stalk of the small bowel mesentery.  R1 resection based on miliary disease (in the setting of incomplete pelvic evaluation).   10/01/2022 Pathology Results   A. Pelvic sidewall, right, excision - High-grade serous carcinoma   B. Omentum, resection - High-grade serous carcinoma - Treatment effect present - See comment   C. Small bowel, implant, excision - Infarcted epiploic appendage - Negative for carcinoma  Immunohistochemical stains were performed on block B1. The tumor cells are positive for WT1, compatible with tubo-ovarian or peritoneal origin. p53 demonstrates faint positivity in rare cells, favored to be most consistent with a null pattern.     Received cycle 9 of Carbo/taxol on 08/29/22   Interval History: Doing well.  Denies any abdominal or pelvic pain.  Reports baseline bowel bladder function.  Endorses good appetite without nausea or emesis.  Past Medical/Surgical History: Past Medical History:  Diagnosis Date   Acute respiratory disease due to COVID-19 virus 02/02/2019   Anxiety    Cancer (HCC)    Depression    Dyspnea    Dysrhythmia    History of diabetes mellitus    Resolved in Sept 2023 with weight loss.   Hypertension    Ovarian cancer (HCC) 11/14/2021   Peritoneal carcinomatosis (HCC)    Pneumonia    Sleep apnea    doesn't use CPAP   Tachycardia     Past Surgical History:  Procedure Laterality Date   KNEE SURGERY Right    Arthroscopic   LAPAROSCOPIC ABDOMINAL EXPLORATION N/A 11/13/2021   Procedure: Exploratory laparoscopy;  Surgeon: Franky Macho, MD;  Location: [facility];  Service: General;  Laterality: N/A;   PORTACATH PLACEMENT Left 11/15/2021   Procedure: INSERTION PORT-A-CATH;  Surgeon: Franky Macho, MD;  Location: [facility];  Service: General;  Laterality: Left;   TONSILLECTOMY AND ADENOIDECTOMY      Family History  Problem Relation Age of Onset  Hypertension Mother    Bladder Cancer Maternal Grandmother    Breast cancer Maternal Great-grandmother    Colon cancer Neg Hx    Ovarian cancer Neg Hx    Endometrial cancer Neg Hx    Pancreatic cancer Neg Hx    Prostate cancer Neg Hx     Social History   Socioeconomic History   Marital status: Married    Spouse name: Not on file   Number of children: Not on file   Years of education: Not on file   Highest education level: Not on file  Occupational History   Occupation: Unemployed  Tobacco Use   Smoking status: Never   Smokeless tobacco: Never  Vaping Use   Vaping Use: Never used  Substance and Sexual Activity   Alcohol use: No   Drug use: No   Sexual activity: Not Currently  Other Topics Concern   Not on file  Social History Narrative   Not on file   Social Determinants of Health   Financial Resource Strain: Not on file  Food Insecurity: No Food Insecurity (03/06/2022)   Hunger Vital Sign    Worried About Running Out of Food in the Last Year: Never true    Ran Out of Food in the Last Year: Never true  Transportation Needs: No Transportation Needs (03/06/2022)   PRAPARE - Administrator, Civil Service (Medical): No    Lack of Transportation (Non-Medical): No  Physical Activity: Not on file  Stress: Not on file  Social Connections: Not on file    Current Medications:  Current Outpatient Medications:    apixaban (ELIQUIS) 5 MG TABS tablet, Take 1 tablet (5 mg total) by mouth 2 (two) times daily., Disp: 180 tablet, Rfl: 3   atorvastatin (LIPITOR) 20 MG tablet, Take 1 tablet (20 mg total) by mouth at bedtime., Disp: 90 tablet, Rfl: 3   diphenoxylate-atropine (LOMOTIL) 2.5-0.025 MG tablet, Take 2 tablets by mouth after the first watery bowel movement. May then take 1 pill by mouth after each watery bowel movement. Do not exceed 8 pills in a 24 hour period., Disp: 60 tablet, Rfl: 3   DULoxetine (CYMBALTA) 30 MG capsule, TAKE 1 CAPSULE BY MOUTH DAILY FOR 7  DAYS, THEN INCREASE TO 2 CAPSULES DAILY, Disp: 60 capsule, Rfl: 5   magnesium oxide (MAG-OX) 400 (240 Mg) MG tablet, TAKE 1 TABLET BY MOUTH TWICE A DAY, Disp: 60 tablet, Rfl: 2   Multiple Vitamin (MULTIVITAMIN) capsule, Take 1 capsule by mouth daily., Disp: , Rfl:    Potassium 99 MG TABS, Take 99 mg by mouth at bedtime., Disp: , Rfl:    prochlorperazine (COMPAZINE) 10 MG tablet, Take 1 tablet (10 mg total) by mouth every 6 (six) hours as needed for nausea or vomiting. (Patient not taking: Reported on 10/25/2022), Disp: 30 tablet, Rfl: 3 No current facility-administered medications for this visit.  Facility-Administered Medications Ordered in Other Visits:    heparin lock flush 100 unit/mL, 500 Units, Intravenous, Once, [name], MD   sodium chloride flush (NS) 0.9 % injection 10 mL, 10 mL, Intravenous, PRN, [name], MD  Review of Systems: Denies appetite changes, fevers, chills, fatigue, unexplained weight changes. Denies hearing loss, neck lumps or masses, mouth sores, ringing in ears or voice changes. Denies cough or wheezing.  Denies shortness of breath. Denies chest pain or palpitations. Denies leg swelling. Denies abdominal distention, pain, blood in stools, constipation, diarrhea, nausea, vomiting, or early satiety. Denies pain with intercourse, dysuria, frequency, hematuria  or incontinence. Denies hot flashes, pelvic pain, vaginal bleeding or vaginal discharge.   Denies joint pain, back pain or muscle pain/cramps. Denies itching, rash, or wounds. Denies dizziness, headaches, numbness or seizures. Denies swollen lymph nodes or glands, denies easy bruising or bleeding. Denies anxiety, depression, confusion, or decreased concentration.  Physical Exam: BP 107/74 (BP Location: Left Arm, Patient Position: Sitting)   Pulse (!) 101   Temp 98.6 F (37 C)   Resp 18   Ht 5' 2.21" (1.58 m)   Wt 199 lb (90.3 kg)   SpO2 99%   BMI 36.16 kg/m  General: Alert,  oriented, no acute distress. HEENT: Normocephalic, atraumatic, sclera anicteric. Chest: Unlabored breathing on room air. Abdomen: Obese, soft, nontender.  No masses or hepatosplenomegaly appreciated.  Well-healed incisions. Extremities: Grossly normal range of motion.  Warm, well perfused.  No edema bilaterally.  Laboratory & Radiologic Studies:          Component Ref Range & Units 3 mo ago (07/16/22) 4 mo ago (06/26/22) 5 mo ago (05/24/22) 6 mo ago (04/16/22) 7 mo ago (03/19/22) 9 mo ago (01/22/22) 9 mo ago (01/02/22)  Cancer Antigen (CA) 125 0.0 - 38.1 U/mL 11.6 16.0 CM 20.4 CM 13.4 CM 27.4 CM 44.3 High  CM 322.0 High  CM     Assessment & Plan: Nicole Santos is a 52 y.o. 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 P53 mutated.   The patient is doing well after surgery.  Discussed continued expectations and restrictions.  Reviewed pathology from surgery.  The patient was given a copy of her pathology report which we reviewed in detail today.  Discussed suspicion that this was a primary peritoneal carcinoma.  In terms of next steps, given response to platinum doublet, I would recommend another 2-3 cycles of platinum doublet therapy with consideration of adding bevacizumab.  Bevacizumab could then be continued as maintenance therapy.  I have also recommended to the patient that we get genetic testing and of asked that her tumor be sent for genomic testing at [facility].  If this identifies BRCA mutation or HRD positive, she would be a candidate for either a PARP inhibitor alone or in combination with Avastin for maintenance.  I will plan to see the patient back in 3-4 months for follow-up.     20 minutes of total time was spent for this patient encounter, including preparation, face-to-face counseling with the patient and coordination of care, and documentation of the encounter.  Eugene Garnet, MD  Division of  Gynecologic Oncology  Department of Obstetrics and Gynecology  [facility]

## 2022-10-25 NOTE — Patient Instructions (Signed)
It was great to see you.  You are healing very well from surgery.  I will send my note from today to Dr. Kirtland Bouchard and will let you know once I get your genetic testing and tumor testing results back.  Please remember, no heavy lifting for 4-6 weeks after surgery.

## 2022-10-26 ENCOUNTER — Telehealth: Payer: Self-pay | Admitting: Genetic Counselor

## 2022-10-26 NOTE — Telephone Encounter (Signed)
Blood drawn and sent out for genetic testing.  Patinet's appointment is scheduled for Tuesday, 5/28.  Due to distance from her home to [facility], this will now be a virtual appointment.  Results will go into the s drive, and ultimately EPIC once received.

## 2022-10-29 NOTE — Progress Notes (Signed)
[facility] [address] [name] [address]    Clinic Day:  10/30/2022  Referring physician: [name], DO  Patient Care Team: Nicole Ip, DO as PCP - General (Family Medicine) [name], MD as Referring Physician (Optometry) Nicole Massed, MD as Medical Oncologist (Medical Oncology) [name], RN as Oncology Nurse Navigator (Medical Oncology)   ASSESSMENT & PLAN:   Assessment: 1. High-grade serous carcinoma of peritoneum: - She had developed sharp left-sided abdominal pain since Mother's Day, occasional radiating to the back.  40 pound weight loss in the last 6 months. - Personal history of cervical cancer in 2000, status post chemo plus XRT at [facility].  She has slightly decreased hearing in the left ear since then. - US abdomen (11/08/2021): Cholelithiasis with gallbladder wall thickening and sonographic Murphy sign.  Upper abdominal ascites. - Laparoscopy (11/13/2021): Starting of the omentum as well as the upper abdominal wall and diaphragm.  Liver was within normal limits.  6 L of ascites was evacuated.  Omental and peritoneal biopsies were sent.  Peritoneal implants throughout and a significant portion of omentum and on the small intestine. - CT CAP (11/13/2021): Ascites, extensive peritoneal thickening, nodularity and omental caking.  No evidence of primary malignancy in the chest, abdomen or pelvis.  Small left pleural effusion, nonspecific.  Cholelithiasis. - US pelvis (11/14/2021): Uterus is grossly unremarkable.  Ovaries not visualized. - CA125 (11/13/2021): 1269 - Pathology: Peritoneum and omental biopsy consistent with high-grade serous carcinoma.  HER2 2+ by IHC and negative by FISH. - 4 cycles of carboplatin and paclitaxel from 11/21/2021 through 01/23/2022, cycle 9 on 08/29/2022. - 10/01/2022: Diagnostic laparoscopy, exploratory laparotomy, omentectomy - Pathology: Right pelvic sidewall excision-high-grade  serous carcinoma, omental resection-high-grade serous carcinoma with treatment effect present.  Small bowel implant excision was negative for carcinoma.  2. Social/family history: - Lives at home with her husband.  Worked in Engineering geologist at [employer].  Non-smoker. - Maternal grandmother with bladder cancer.  Maternal great grandmother with breast cancer.    Plan: High-grade serous carcinoma of peritoneum: - She underwent debulking surgery on 10/01/2022.  She has tolerated it very well. - She met with Dr. Pricilla Holm on 10/25/2022. - I have reviewed surgery and pathology reports. - Agree with continuing another 2-3 cycles of platinum doublet therapy with addition of bevacizumab. - Germline and somatic mutation testing results are pending at this time. - If she has BRCA mutation/HRD positive, would be a candidate for PARP inhibitor alone or with Avastin.  If not she will continue bevacizumab maintenance. - We will schedule her for surgery next week.   2.  Peripheral neuropathy: - She reports that her toes feel like wrapped in a gauze.  Continue Cymbalta 60 mg daily.  She does not report any neuropathic pains. - She was prescribed Lyrica by Dr. Pricilla Holm.  She will try and see if it helps.  3.  Diarrhea: - Continue Lomotil as needed.  She did not require Lomotil recently.  4.  Hypomagnesemia: - Continue magnesium twice daily.   5.  Hypokalemia: - Continue potassium 20 mill equivalents daily.   6.  Subsegmental pulmonary embolism (Dx 03/06/2022): - Continue Eliquis twice daily.  No bleeding issues reported.    Orders Placed This Encounter  Procedures   Magnesium    Standing Status:   Future    Standing Expiration Date:   11/06/2023   CBC with Differential    Standing Status:   Future  Standing Expiration Date:   11/06/2023   Comprehensive metabolic panel    Standing Status:   Future    Standing Expiration Date:   11/06/2023   Magnesium    Standing Status:   Future    Standing Expiration Date:    11/27/2023   CBC with Differential    Standing Status:   Future    Standing Expiration Date:   11/27/2023   Comprehensive metabolic panel    Standing Status:   Future    Standing Expiration Date:   11/27/2023   Magnesium    Standing Status:   Future    Standing Expiration Date:   12/18/2023   CBC with Differential    Standing Status:   Future    Standing Expiration Date:   12/18/2023   Comprehensive metabolic panel    Standing Status:   Future    Standing Expiration Date:   12/18/2023      I,Katie Daubenspeck,acting as a scribe for Nicole Massed, MD.,have documented all relevant documentation on the behalf of Nicole Massed, MD,as directed by  Nicole Massed, MD while in the presence of Nicole Massed, MD.   I, Nicole Massed MD, have reviewed the above documentation for accuracy and completeness, and I agree with the above.   Nicole Massed, MD   5/28/20244:38 PM  CHIEF COMPLAINT:   Diagnosis: high-grade serous carcinoma of peritoneum    Cancer Staging  Primary peritoneal carcinomatosis [facility]) Staging form: Ovary, Fallopian Tube, and Primary Peritoneal Carcinoma, AJCC 8th Edition - Clinical stage from 11/17/2021: FIGO Stage IIIC (cT3c, cM0) - Unsigned    Prior Therapy: Carboplatin and paclitaxel-- x 4 cycles 11/21/2021 - 01/23/2022, x5 cycles 06/05/22 - 08/29/22  Current Therapy: Carboplatin/paclitaxel/bevacizumab   HISTORY OF PRESENT ILLNESS:   Oncology History  Primary peritoneal carcinomatosis (HCC)  11/13/2021 Imaging   CT A/P: 1. Trace ascites throughout the abdomen and pelvis, with extensive peritoneal thickening, nodularity, and omental caking. Findings are consistent with widespread peritoneal and omental metastatic disease. 2. No evidence of primary malignancy in the chest, abdomen, or pelvis, with specific attention to the most likely etiologies of peritoneal metastatic disease including pancreatic, ovarian, and endometrial  malignancy. Although there is no obvious mass, pelvic ultrasound may be helpful to more closely assess the female reproductive organs. 3. Small left pleural effusion and associated atelectasis or consolidation, nonspecific although worrisome for malignant effusion. No overt pleural thickening or nodularity. 4. Scattered areas of nonspecific infectious or inflammatory alveolar ground-glass airspace opacity throughout the lungs. 5. Cholelithiasis without evidence of acute cholecystitis. 6. Nonobstructive right nephrolithiasis. 7. Postoperative findings of same day laparoscopy including minimal pneumoperitoneum.   Aortic Atherosclerosis (ICD10-I70.0).   11/13/2021 Surgery   Diagnostic laparoscopy with biopsy Dr. Lovell Sheehan Findings: Significant bloody ascites, studding of the omentum as well as the upper abdominal wall and diaphragm.  Nodularity noted along the small intestine.  Omental disease inhibited view of the pelvis.   11/17/2021 Initial Diagnosis   Primary peritoneal carcinomatosis (HCC)   11/21/2021 - 01/25/2022 Chemotherapy   Patient is on Treatment Plan : OVARIAN Carboplatin (AUC 6) / Paclitaxel (175) q21d x 6 cycles     11/21/2021 -  Chemotherapy   Patient is on Treatment Plan : OVARIAN Carboplatin (AUC 6) + Paclitaxel (175) q21d X 6 Cycles     01/19/2022 Imaging   CT A/P: 1. Small amount of adherent thrombus or fibrin sheath around the catheter in the LEFT brachiocephalic vein. 2. Small low-attenuation area in the inter lobar fissure region associated  with medial segment of LEFT hepatic lobe, new from previous imaging, may represent developing "variable fat deposition" though given history of peritoneal disease would suggest attention on subsequent imaging. 3. Diminished thickening of mesenteric reflections, decreased ascites and decreased omental infiltration since previous imaging. 4. Focal concentric narrowing of the rectosigmoid junction is nonspecific but suspicious for  neoplasm potentially related to serosal involvement in the pelvis in this patient with known peritoneal disease from ovarian cancer. Underlying colonic neoplasm would be difficult to exclude as well. Colonic assessment with endoscopy or comparison with recent endoscopy performed is suggested. 5. Cholelithiasis without evidence of acute cholecystitis. 6. 4 mm RIGHT lower pole renal calculus.   Aortic Atherosclerosis (ICD10-I70.0).   03/06/2022 Imaging   CT C/A/P: Chest CT:   Scattered subsegmental pulmonary emboli in the right lung.   Abdominal CT:   1. Mild right hydroureteronephrosis from a 4 mm UVJ calculus. 2. Unchanged omental disease. 3. Cholelithiasis and other chronic findings described above.   05/24/2022 Imaging   Increased peritoneal carcinomatosis and mild ascites.   New moderate distal small bowel obstruction, with transition point in the central pelvis adjacent to the uterine fundus, which may be due to peritoneal carcinomatosis or adhesion.   Cholelithiasis. No radiographic evidence of cholecystitis.   Aortic Atherosclerosis (ICD10-I70.0).   08/21/2022 Imaging   CT A/P: 1. Improved peritoneal carcinomatosis and mild ascites. 2. Prominent/mildly enlarged loops of small bowel throughout the abdomen with transition point in the pelvis, not significantly changed from prior and compatible with a least low-grade obstruction. 3. Cholelithiasis without findings of acute cholecystitis.   10/01/2022 Surgery   Diagnostic laparoscopy, robotic assisted laparoscopic lysis of adhesions, exploratory laparotomy, omentectomy   Findings: On EUA, small uterus within minimal mobility. On intra-abdominal entry, omentum adherent to the anterior abdominal wall at the level of the transverse colon. Some adhesions noted between the anterior liver surface and the diaphragm on the right. Small miliary disease noted on bilateral diaphragms, small bowel, small bowel mesentery, abdominal  and pelvic peritoneum. No ascites. Complete retroperitonealization of the pelvic organs. Multiple loops of small bowel were densely adherent to the uterine fundus without obvious obstruction. Terminal ileum and cecum also adherent along the right pelvic sidewall. Sigmoid colon densely adherent to a loop of ileum and to the lateral aspect of the retroperitonealized pelvis on the left. After opening the retroperitoneum bilaterally and transecting the round ligaments, the bladder flap was able to be partially developed anteriorly. This revealed dense adhesions of the sigmoid to the lateral uterus. Given prior discussions with the patient, I did not feel that proceeding with further attempt at lysis of adhesions (some mobilization of two loops of ileum adherent to the uterine fundus was performed) would be possible without significant risk of injury to multiple loops of small bowel and to the sigmoid colon. 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 On laparotomy, 8 x 4 cm omental cake noted. No disease visualized or palpated within the lesser sac. There was no palpable disease along the liver edges or spleen. Stomach was normal in appearance. Some miliary disease palpated along the abdominal peritoneum. Numerous miliary disease noted along the small bowel and small bowel mesentery. Numerous prominent lymph nodes (1cm or less) within the stalk of the small bowel mesentery.  R1 resection based on miliary disease (in the setting of incomplete pelvic evaluation).   10/01/2022 Pathology Results   A. Pelvic sidewall, right, excision - High-grade serous carcinoma   B. Omentum, resection -  High-grade serous carcinoma - Treatment effect present - See comment   C. Small bowel, implant, excision - Infarcted epiploic appendage - Negative for carcinoma  Immunohistochemical stains were performed on block B1. The tumor cells are positive for WT1, compatible with  tubo-ovarian or peritoneal origin. p53 demonstrates faint positivity in rare cells, favored to be most consistent with a null pattern.       INTERVAL HISTORY:   Nicole Santos is a 52 y.o. female presenting to clinic today for follow up of high-grade serous carcinoma of peritoneum. She was last seen by me on 08/29/22.  Since her last visit, she proceeded with debulking surgery on 10/01/22 under Dr. Pricilla Holm. Pathology from the right pelvic sidewall and omentum showed high-grade serous carcinoma, while the small bowel implant was negative for carcinoma.  Today, she states that she is doing well overall. Her appetite level is at 100%. Her energy level is at 100%.  PAST MEDICAL HISTORY:   Past Medical History: Past Medical History:  Diagnosis Date   Acute respiratory disease due to COVID-19 virus 02/02/2019   Anxiety    Cancer (HCC)    Depression    Dyspnea    Dysrhythmia    History of diabetes mellitus    Resolved in Sept 2023 with weight loss.   Hypertension    Ovarian cancer (HCC) 11/14/2021   Peritoneal carcinomatosis (HCC)    Pneumonia    Sleep apnea    doesn't use CPAP   Tachycardia     Surgical History: Past Surgical History:  Procedure Laterality Date   KNEE SURGERY Right    Arthroscopic   LAPAROSCOPIC ABDOMINAL EXPLORATION N/A 11/13/2021   Procedure: Exploratory laparoscopy;  Surgeon: Franky Macho, MD;  Location: [facility];  Service: General;  Laterality: N/A;   PORTACATH PLACEMENT Left 11/15/2021   Procedure: INSERTION PORT-A-CATH;  Surgeon: Franky Macho, MD;  Location: [facility];  Service: General;  Laterality: Left;   TONSILLECTOMY AND ADENOIDECTOMY      Social History: Social History   Socioeconomic History   Marital status: Married    Spouse name: Not on file   Number of children: Not on file   Years of education: Not on file   Highest education level: Not on file  Occupational History   Occupation: Unemployed  Tobacco Use   Smoking status: Never   Smokeless tobacco:  Never  Vaping Use   Vaping Use: Never used  Substance and Sexual Activity   Alcohol use: No   Drug use: No   Sexual activity: Not Currently  Other Topics Concern   Not on file  Social History Narrative   Not on file   Social Determinants of Health   Financial Resource Strain: Not on file  Food Insecurity: No Food Insecurity (03/06/2022)   Hunger Vital Sign    Worried About Running Out of Food in the Last Year: Never true    Ran Out of Food in the Last Year: Never true  Transportation Needs: No Transportation Needs (03/06/2022)   PRAPARE - Administrator, Civil Service (Medical): No    Lack of Transportation (Non-Medical): No  Physical Activity: Not on file  Stress: Not on file  Social Connections: Not on file  Intimate Partner Violence: Not At Risk (03/06/2022)   Humiliation, Afraid, Rape, and Kick questionnaire    Fear of Current or Ex-Partner: No    Emotionally Abused: No    Physically Abused: No    Sexually Abused: No    Family History: Family  History  Problem Relation Age of Onset   Hypertension Mother    Bladder Cancer Maternal Grandmother    Breast cancer Maternal Great-grandmother    Colon cancer Neg Hx    Ovarian cancer Neg Hx    Endometrial cancer Neg Hx    Pancreatic cancer Neg Hx    Prostate cancer Neg Hx     Current Medications:  Current Outpatient Medications:    apixaban (ELIQUIS) 5 MG TABS tablet, Take 1 tablet (5 mg total) by mouth 2 (two) times daily., Disp: 180 tablet, Rfl: 3   atorvastatin (LIPITOR) 20 MG tablet, Take 1 tablet (20 mg total) by mouth at bedtime., Disp: 90 tablet, Rfl: 3   diphenoxylate-atropine (LOMOTIL) 2.5-0.025 MG tablet, Take 2 tablets by mouth after the first watery bowel movement. May then take 1 pill by mouth after each watery bowel movement. Do not exceed 8 pills in a 24 hour period., Disp: 60 tablet, Rfl: 3   DULoxetine (CYMBALTA) 30 MG capsule, TAKE 1 CAPSULE BY MOUTH DAILY FOR 7 DAYS, THEN INCREASE TO 2  CAPSULES DAILY, Disp: 60 capsule, Rfl: 5   magnesium oxide (MAG-OX) 400 (240 Mg) MG tablet, TAKE 1 TABLET BY MOUTH TWICE A DAY, Disp: 60 tablet, Rfl: 2   Multiple Vitamin (MULTIVITAMIN) capsule, Take 1 capsule by mouth daily., Disp: , Rfl:    Potassium 99 MG TABS, Take 99 mg by mouth at bedtime., Disp: , Rfl:    pregabalin (LYRICA) 100 MG capsule, Take 100 mg by mouth 2 (two) times daily., Disp: , Rfl:    prochlorperazine (COMPAZINE) 10 MG tablet, Take 1 tablet (10 mg total) by mouth every 6 (six) hours as needed for nausea or vomiting., Disp: 30 tablet, Rfl: 3 No current facility-administered medications for this visit.  Facility-Administered Medications Ordered in Other Visits:    heparin lock flush 100 unit/mL, 500 Units, Intravenous, Once, Nicole Massed, MD   sodium chloride flush (NS) 0.9 % injection 10 mL, 10 mL, Intravenous, PRN, Nicole Massed, MD   Allergies: Allergies  Allergen Reactions   Augmentin [Amoxicillin-Pot Clavulanate] Diarrhea and Nausea And Vomiting   Doxycycline     Abdominal cramps.   Topamax [Topiramate] Nausea And Vomiting    REVIEW OF SYSTEMS:   Review of Systems  Constitutional:  Negative for chills, fatigue and fever.  HENT:   Negative for lump/mass, mouth sores, nosebleeds, sore throat and trouble swallowing.   Eyes:  Negative for eye problems.  Respiratory:  Negative for cough and shortness of breath.   Cardiovascular:  Negative for chest pain, leg swelling and palpitations.  Gastrointestinal:  Negative for abdominal pain, constipation, diarrhea, nausea and vomiting.  Genitourinary:  Negative for bladder incontinence, difficulty urinating, dysuria, frequency, hematuria and nocturia.   Musculoskeletal:  Negative for arthralgias, back pain, flank pain, myalgias and neck pain.  Skin:  Negative for itching and rash.  Neurological:  Positive for numbness. Negative for dizziness and headaches.  Hematological:  Does not bruise/bleed easily.   Psychiatric/Behavioral:  Negative for depression, sleep disturbance and suicidal ideas. The patient is not nervous/anxious.   All other systems reviewed and are negative.    VITALS:   Blood pressure 126/78, pulse 83, temperature 98.6 F (37 C), temperature source Oral, resp. rate 16, weight 201 lb 3.2 oz (91.3 kg), SpO2 99 %.  Wt Readings from Last 3 Encounters:  10/30/22 201 lb 3.2 oz (91.3 kg)  10/25/22 199 lb (90.3 kg)  09/18/22 195 lb (88.5 kg)    Body mass index  is 36.56 kg/m.  Performance status (ECOG): 1 - Symptomatic but completely ambulatory  PHYSICAL EXAM:   Physical Exam Vitals and nursing note reviewed. Exam conducted with a chaperone present.  Constitutional:      Appearance: Normal appearance.  Cardiovascular:     Rate and Rhythm: Normal rate and regular rhythm.     Pulses: Normal pulses.     Heart sounds: Normal heart sounds.  Pulmonary:     Effort: Pulmonary effort is normal.     Breath sounds: Normal breath sounds.  Abdominal:     Palpations: Abdomen is soft. There is no hepatomegaly, splenomegaly or mass.     Tenderness: There is no abdominal tenderness.  Musculoskeletal:     Right lower leg: No edema.     Left lower leg: No edema.  Lymphadenopathy:     Cervical: No cervical adenopathy.     Right cervical: No superficial, deep or posterior cervical adenopathy.    Left cervical: No superficial, deep or posterior cervical adenopathy.     Upper Body:     Right upper body: No supraclavicular or axillary adenopathy.     Left upper body: No supraclavicular or axillary adenopathy.  Neurological:     General: No focal deficit present.     Mental Status: She is alert and oriented to person, place, and time.  Psychiatric:        Mood and Affect: Mood normal.        Behavior: Behavior normal.     LABS:      Latest Ref Rng & Units 08/29/2022    8:25 AM 08/07/2022    8:13 AM 07/16/2022    8:19 AM  CBC  WBC 4.0 - 10.5 K/uL 3.8  4.3  4.1   Hemoglobin  12.0 - 15.0 g/dL 9.9  16.1  09.6   Hematocrit 36.0 - 46.0 % 29.7  31.6  30.7   Platelets 150 - 400 K/uL 118  188  203       Latest Ref Rng & Units 08/29/2022    8:25 AM 08/07/2022    8:13 AM 07/16/2022    8:19 AM  CMP  Glucose 70 - 99 mg/dL 045  409  811   BUN 6 - 20 mg/dL 14  8  11    Creatinine 0.44 - 1.00 mg/dL 9.14  7.82  9.56   Sodium 135 - 145 mmol/L 136  137  140   Potassium 3.5 - 5.1 mmol/L 3.8  3.9  4.1   Chloride 98 - 111 mmol/L 105  105  105   CO2 22 - 32 mmol/L 25  25  27    Calcium 8.9 - 10.3 mg/dL 8.4  8.6  8.7   Total Protein 6.5 - 8.1 g/dL 6.5  6.4  6.4   Total Bilirubin 0.3 - 1.2 mg/dL 0.5  0.8  0.7   Alkaline Phos 38 - 126 U/L 64  63  64   AST 15 - 41 U/L 13  20  17    ALT 0 - 44 U/L 12  16  13       Lab Results  Component Value Date   CEA1 1.1 11/13/2021   /  CEA  Date Value Ref Range Status  11/13/2021 1.1 0.0 - 4.7 ng/mL Final    Comment:    (NOTE)                             Nonsmokers          <  3.9                             Smokers             <5.6 [employer] Electrochemiluminescence Immunoassay (ECLIA) Values obtained with different assay methods or kits cannot be used interchangeably.  Results cannot be interpreted as absolute evidence of the presence or absence of malignant disease. Performed At: [facility] [address] [place], [name] [id number] Jolene Schimke MD WU:[id number]    No results found for: "PSA1" Lab Results  Component Value Date   CAN199 6 11/13/2021   Lab Results  Component Value Date   CAN125 11.6 07/16/2022    No results found for: "TOTALPROTELP", "ALBUMINELP", "A1GS", "A2GS", "BETS", "BETA2SER", "GAMS", "MSPIKE", "SPEI" Lab Results  Component Value Date   FERRITIN 356 (H) 02/07/2019   FERRITIN 311 (H) 02/06/2019   FERRITIN 343 (H) 02/05/2019   Lab Results  Component Value Date   LDH 259 (H) 02/02/2019     STUDIES:   No results found.

## 2022-10-30 ENCOUNTER — Other Ambulatory Visit: Payer: Self-pay | Admitting: Gynecologic Oncology

## 2022-10-30 ENCOUNTER — Other Ambulatory Visit: Payer: Commercial Managed Care - PPO

## 2022-10-30 ENCOUNTER — Inpatient Hospital Stay (HOSPITAL_BASED_OUTPATIENT_CLINIC_OR_DEPARTMENT_OTHER): Payer: Commercial Managed Care - PPO | Admitting: Genetic Counselor

## 2022-10-30 ENCOUNTER — Encounter: Payer: Self-pay | Admitting: Gynecologic Oncology

## 2022-10-30 ENCOUNTER — Inpatient Hospital Stay (HOSPITAL_BASED_OUTPATIENT_CLINIC_OR_DEPARTMENT_OTHER): Payer: Commercial Managed Care - PPO | Admitting: Hematology

## 2022-10-30 VITALS — BP 126/78 | HR 83 | Temp 98.6°F | Resp 16 | Wt 201.2 lb

## 2022-10-30 DIAGNOSIS — C482 Malignant neoplasm of peritoneum, unspecified: Secondary | ICD-10-CM | POA: Diagnosis present

## 2022-10-30 DIAGNOSIS — Z86711 Personal history of pulmonary embolism: Secondary | ICD-10-CM | POA: Diagnosis not present

## 2022-10-30 DIAGNOSIS — Z9221 Personal history of antineoplastic chemotherapy: Secondary | ICD-10-CM | POA: Diagnosis not present

## 2022-10-30 DIAGNOSIS — E876 Hypokalemia: Secondary | ICD-10-CM | POA: Diagnosis not present

## 2022-10-30 DIAGNOSIS — R197 Diarrhea, unspecified: Secondary | ICD-10-CM | POA: Diagnosis not present

## 2022-10-30 DIAGNOSIS — Z7901 Long term (current) use of anticoagulants: Secondary | ICD-10-CM | POA: Diagnosis not present

## 2022-10-30 DIAGNOSIS — Z8541 Personal history of malignant neoplasm of cervix uteri: Secondary | ICD-10-CM | POA: Diagnosis not present

## 2022-10-30 DIAGNOSIS — Z803 Family history of malignant neoplasm of breast: Secondary | ICD-10-CM

## 2022-10-30 DIAGNOSIS — G629 Polyneuropathy, unspecified: Secondary | ICD-10-CM | POA: Diagnosis not present

## 2022-10-30 DIAGNOSIS — Z8052 Family history of malignant neoplasm of bladder: Secondary | ICD-10-CM | POA: Diagnosis not present

## 2022-10-30 DIAGNOSIS — C569 Malignant neoplasm of unspecified ovary: Secondary | ICD-10-CM

## 2022-10-30 DIAGNOSIS — Z923 Personal history of irradiation: Secondary | ICD-10-CM | POA: Diagnosis not present

## 2022-10-30 DIAGNOSIS — Z79899 Other long term (current) drug therapy: Secondary | ICD-10-CM | POA: Diagnosis not present

## 2022-10-30 NOTE — Patient Instructions (Addendum)
[facility] at [facility] Discharge Instructions   You were seen and examined today by Dr. Ellin Saba.  He discussed with you restarting treatment at the request of Dr. Pricilla Holm. She would like for you to receive a few more cycles of treatment post surgery. She would also like [place] to add a medication called Avastin to your treatment. This is a monoclonal antibody that works by cutting off the blood supply to the cancer cells. Treatment will still be every 3 weeks.   Return as scheduled.      Thank you for choosing [facility] at [facility] to provide your oncology and hematology care.  To afford each patient quality time with our provider, please arrive at least 15 minutes before your scheduled appointment time.   If you have a lab appointment with the Cancer Center please come in thru the Main Entrance and check in at the main information desk.  You need to re-schedule your appointment should you arrive 10 or more minutes late.  We strive to give you quality time with our providers, and arriving late affects you and other patients whose appointments are after yours.  Also, if you no show three or more times for appointments you may be dismissed from the clinic at the providers discretion.     Again, thank you for choosing [facility]  Our hope is that these requests will decrease the amount of time that you wait before being seen by our physicians.       _____________________________________________________________  Should you have questions after your visit to [facility], please contact our office at [phone] and follow the prompts.  Our office hours are 8:00 a.m. and 4:30 p.m. Monday - Friday.  Please note that voicemails left after 4:00 p.m. may not be returned until the following business day.  We are closed weekends and major holidays.  You do have access to a nurse 24-7, just call the main number to the clinic  [phone] and do not press any options, hold on the line and a nurse will answer the phone.    For prescription refill requests, have your pharmacy contact our office and allow 72 hours.    Due to Covid, you will need to wear a mask upon entering the hospital. If you do not have a mask, a mask will be given to you at the Main Entrance upon arrival. For doctor visits, patients may have 1 support person age 53 or older with them. For treatment visits, patients can not have anyone with them due to social distancing guidelines and our immunocompromised population.

## 2022-10-31 ENCOUNTER — Encounter: Payer: Self-pay | Admitting: Hematology

## 2022-10-31 ENCOUNTER — Encounter: Payer: Self-pay | Admitting: Genetic Counselor

## 2022-10-31 DIAGNOSIS — Z803 Family history of malignant neoplasm of breast: Secondary | ICD-10-CM | POA: Insufficient documentation

## 2022-10-31 DIAGNOSIS — Z8052 Family history of malignant neoplasm of bladder: Secondary | ICD-10-CM | POA: Insufficient documentation

## 2022-10-31 NOTE — Progress Notes (Signed)
REFERRING PROVIDER: Carver Fila, MD [address] [place],  [name] [address]  PRIMARY PROVIDER:  [name], DO  PRIMARY REASON FOR VISIT:  1. Family history of breast cancer   2. Family history of bladder cancer   3. Malignant neoplasm of ovary, unspecified laterality (HCC)      HISTORY OF PRESENT ILLNESS:  I connected with  [name] on 10/31/2022 at 2 PM EDT by telephone conference and verified that I am speaking with the correct person using two identifiers.   Patient location: Home Provider location: [facility]  Ms. Bandt, a 52 y.o. female, was seen for a [place] cancer genetics consultation at the request of Dr. Pricilla Holm due to a personal and family history of cancer.  Ms. Personette presents to clinic today to discuss the possibility of a hereditary predisposition to cancer, genetic testing, and to further clarify her future cancer risks, as well as potential cancer risks for family members.   In June 2023, at the age of 72, Ms. Schnell was diagnosed with Primary Peritoneal/ovarian cancer. The treatment plan surgery and chemotherapy.     CANCER HISTORY:  Oncology History  Primary peritoneal carcinomatosis (HCC)  11/13/2021 Imaging   CT A/P: 1. Trace ascites throughout the abdomen and pelvis, with extensive peritoneal thickening, nodularity, and omental caking. Findings are consistent with widespread peritoneal and omental metastatic disease. 2. No evidence of primary malignancy in the chest, abdomen, or pelvis, with specific attention to the most likely etiologies of peritoneal metastatic disease including pancreatic, ovarian, and endometrial malignancy. Although there is no obvious mass, pelvic ultrasound may be helpful to more closely assess the female reproductive organs. 3. Small left pleural effusion and associated atelectasis or consolidation, nonspecific although worrisome for malignant effusion. No overt pleural thickening or  nodularity. 4. Scattered areas of nonspecific infectious or inflammatory alveolar ground-glass airspace opacity throughout the lungs. 5. Cholelithiasis without evidence of acute cholecystitis. 6. Nonobstructive right nephrolithiasis. 7. Postoperative findings of same day laparoscopy including minimal pneumoperitoneum.   Aortic Atherosclerosis (ICD10-I70.0).   11/13/2021 Surgery   Diagnostic laparoscopy with biopsy Dr. Lovell Sheehan Findings: Significant bloody ascites, studding of the omentum as well as the upper abdominal wall and diaphragm.  Nodularity noted along the small intestine.  Omental disease inhibited view of the pelvis.   11/17/2021 Initial Diagnosis   Primary peritoneal carcinomatosis (HCC)   11/21/2021 - 01/25/2022 Chemotherapy   Patient is on Treatment Plan : OVARIAN Carboplatin (AUC 6) / Paclitaxel (175) q21d x 6 cycles     11/21/2021 -  Chemotherapy   Patient is on Treatment Plan : OVARIAN Carboplatin (AUC 6) + Paclitaxel (175) q21d X 6 Cycles     01/19/2022 Imaging   CT A/P: 1. Small amount of adherent thrombus or fibrin sheath around the catheter in the LEFT brachiocephalic vein. 2. Small low-attenuation area in the inter lobar fissure region associated with medial segment of LEFT hepatic lobe, new from previous imaging, may represent developing "variable fat deposition" though given history of peritoneal disease would suggest attention on subsequent imaging. 3. Diminished thickening of mesenteric reflections, decreased ascites and decreased omental infiltration since previous imaging. 4. Focal concentric narrowing of the rectosigmoid junction is nonspecific but suspicious for neoplasm potentially related to serosal involvement in the pelvis in this patient with known peritoneal disease from ovarian cancer. Underlying colonic neoplasm would be difficult to exclude as well. Colonic assessment with endoscopy or comparison with recent endoscopy performed  is suggested. 5. Cholelithiasis without evidence  of acute cholecystitis. 6. 4 mm RIGHT lower pole renal calculus.   Aortic Atherosclerosis (ICD10-I70.0).   03/06/2022 Imaging   CT C/A/P: Chest CT:   Scattered subsegmental pulmonary emboli in the right lung.   Abdominal CT:   1. Mild right hydroureteronephrosis from a 4 mm UVJ calculus. 2. Unchanged omental disease. 3. Cholelithiasis and other chronic findings described above.   05/24/2022 Imaging   Increased peritoneal carcinomatosis and mild ascites.   New moderate distal small bowel obstruction, with transition point in the central pelvis adjacent to the uterine fundus, which may be due to peritoneal carcinomatosis or adhesion.   Cholelithiasis. No radiographic evidence of cholecystitis.   Aortic Atherosclerosis (ICD10-I70.0).   08/21/2022 Imaging   CT A/P: 1. Improved peritoneal carcinomatosis and mild ascites. 2. Prominent/mildly enlarged loops of small bowel throughout the abdomen with transition point in the pelvis, not significantly changed from prior and compatible with a least low-grade obstruction. 3. Cholelithiasis without findings of acute cholecystitis.   10/01/2022 Surgery   Diagnostic laparoscopy, robotic assisted laparoscopic lysis of adhesions, exploratory laparotomy, omentectomy   Findings: On EUA, small uterus within minimal mobility. On intra-abdominal entry, omentum adherent to the anterior abdominal wall at the level of the transverse colon. Some adhesions noted between the anterior liver surface and the diaphragm on the right. Small miliary disease noted on bilateral diaphragms, small bowel, small bowel mesentery, abdominal and pelvic peritoneum. No ascites. Complete retroperitonealization of the pelvic organs. Multiple loops of small bowel were densely adherent to the uterine fundus without obvious obstruction. Terminal ileum and cecum also adherent along the right pelvic sidewall. Sigmoid colon densely  adherent to a loop of ileum and to the lateral aspect of the retroperitonealized pelvis on the left. After opening the retroperitoneum bilaterally and transecting the round ligaments, the bladder flap was able to be partially developed anteriorly. This revealed dense adhesions of the sigmoid to the lateral uterus. Given prior discussions with the patient, I did not feel that proceeding with further attempt at lysis of adhesions (some mobilization of two loops of ileum adherent to the uterine fundus was performed) would be possible without significant risk of injury to multiple loops of small bowel and to the sigmoid colon. 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 On laparotomy, 8 x 4 cm omental cake noted. No disease visualized or palpated within the lesser sac. There was no palpable disease along the liver edges or spleen. Stomach was normal in appearance. Some miliary disease palpated along the abdominal peritoneum. Numerous miliary disease noted along the small bowel and small bowel mesentery. Numerous prominent lymph nodes (1cm or less) within the stalk of the small bowel mesentery.  R1 resection based on miliary disease (in the setting of incomplete pelvic evaluation).   10/01/2022 Pathology Results   A. Pelvic sidewall, right, excision - High-grade serous carcinoma   B. Omentum, resection - High-grade serous carcinoma - Treatment effect present - See comment   C. Small bowel, implant, excision - Infarcted epiploic appendage - Negative for carcinoma  Immunohistochemical stains were performed on block B1. The tumor cells are positive for WT1, compatible with tubo-ovarian or peritoneal origin. p53 demonstrates faint positivity in rare cells, favored to be most consistent with a null pattern.       Past Medical History:  Diagnosis Date   Acute respiratory disease due to COVID-19 virus 02/02/2019   Anxiety    Cancer (HCC)    Depression     Dyspnea  Dysrhythmia    Family history of bladder cancer    Family history of breast cancer    History of diabetes mellitus    Resolved in Sept 2023 with weight loss.   Hypertension    Ovarian cancer (HCC) 11/14/2021   Peritoneal carcinomatosis (HCC)    Pneumonia    Sleep apnea    doesn't use CPAP   Tachycardia     Past Surgical History:  Procedure Laterality Date   KNEE SURGERY Right    Arthroscopic   LAPAROSCOPIC ABDOMINAL EXPLORATION N/A 11/13/2021   Procedure: Exploratory laparoscopy;  Surgeon: Franky Macho, MD;  Location: [facility];  Service: General;  Laterality: N/A;   PORTACATH PLACEMENT Left 11/15/2021   Procedure: INSERTION PORT-A-CATH;  Surgeon: Franky Macho, MD;  Location: [facility];  Service: General;  Laterality: Left;   TONSILLECTOMY AND ADENOIDECTOMY      Social History   Socioeconomic History   Marital status: Married    Spouse name: Not on file   Number of children: Not on file   Years of education: Not on file   Highest education level: Not on file  Occupational History   Occupation: Unemployed  Tobacco Use   Smoking status: Never   Smokeless tobacco: Never  Vaping Use   Vaping Use: Never used  Substance and Sexual Activity   Alcohol use: No   Drug use: No   Sexual activity: Not Currently  Other Topics Concern   Not on file  Social History Narrative   Not on file   Social Determinants of Health   Financial Resource Strain: Not on file  Food Insecurity: No Food Insecurity (03/06/2022)   Hunger Vital Sign    Worried About Running Out of Food in the Last Year: Never true    Ran Out of Food in the Last Year: Never true  Transportation Needs: No Transportation Needs (03/06/2022)   PRAPARE - Administrator, Civil Service (Medical): No    Lack of Transportation (Non-Medical): No  Physical Activity: Not on file  Stress: Not on file  Social Connections: Not on file     FAMILY HISTORY:  We obtained a detailed, 4-generation family  history.  Significant diagnoses are listed below: Family History  Problem Relation Age of Onset   Hypertension Mother    Heart attack Paternal Uncle    Bladder Cancer Maternal Grandmother 53       non-smoker   Dementia Maternal Grandfather    Breast cancer Maternal Great-grandmother        MGF's mother   Colon cancer Neg Hx    Ovarian cancer Neg Hx    Endometrial cancer Neg Hx    Pancreatic cancer Neg Hx    Prostate cancer Neg Hx      The patient has a son and daughter who are cancer free.  She has one brother who is cancer free.  Her father is deceased and her mother is living.  The patient's father died of a GSW.  He had a brother and sister who were cancer free. The paternal grandparents died of natural causes.  The patient's mother does not have cancer. She has one sister and three deceased brothers.  None were reported to have cancer.  The maternal grandmother was diagnosed with bladder cancer at 62.  The grandfather died of demential.  His mother had breast cancer.  Ms. Wallo is unaware of previous family history of genetic testing for hereditary cancer risks.  There is no reported Ashkenazi [place]  ancestry. There is no known consanguinity.  GENETIC COUNSELING ASSESSMENT: Ms. Goldrick is a 52 y.o. female with a personal and family history of cancer which is somewhat suggestive of a hereditary cancer syndrome and predisposition to cancer given her diagnosis of ovarian cancer. We, therefore, discussed and recommended the following at today's visit.   DISCUSSION: We discussed that, in general, most cancer is not inherited in families, but instead is sporadic or familial. Sporadic cancers occur by chance and typically happen at older ages (>50 years) as this type of cancer is caused by genetic changes acquired during an individual's lifetime. Some families have more cancers than would be expected by chance; however, the ages or types of cancer are not consistent with a known genetic  mutation or known genetic mutations have been ruled out. This type of familial cancer is thought to be due to a combination of multiple genetic, environmental, hormonal, and lifestyle factors. While this combination of factors likely increases the risk of cancer, the exact source of this risk is not currently identifiable or testable.  We discussed that up to 20% of ovarian cancer is hereditary, with most cases associated with BRCA mutations.  There are other genes that can be associated with hereditary ovarian cancer syndromes.  These include BRIP1, RAD51C, RAD51D and Lynch syndrome.  We discussed that testing is beneficial for several reasons including knowing how to follow individuals after completing their treatment, identifying whether potential treatment options such as PARP inhibitors would be beneficial, and understand if other family members could be at risk for cancer and allow them to undergo genetic testing.   We reviewed the characteristics, features and inheritance patterns of hereditary cancer syndromes. We also discussed genetic testing, including the appropriate family members to test, the process of testing, insurance coverage and turn-around-time for results. We discussed the implications of a negative, positive, carrier and/or variant of uncertain significant result. Ms. Janisse  was offered a common hereditary cancer panel (47 genes) and an expanded pan-cancer panel (77 genes). Ms. Pearson was informed of the benefits and limitations of each panel, including that expanded pan-cancer panels contain genes that do not have clear management guidelines at this point in time.  We also discussed that as the number of genes included on a panel increases, the chances of variants of uncertain significance increases. Ms. Fernicola decided to pursue genetic testing for the multi-cancer + RNA gene panel.   The Multi-Cancer + RNA Panel offered by [facility] includes sequencing and/or deletion/duplication analysis  of the following 70 genes:  AIP*, ALK, APC*, ATM*, AXIN2*, BAP1*, BARD1*, BLM*, BMPR1A*, BRCA1*, BRCA2*, BRIP1*, CDC73*, CDH1*, CDK4, CDKN1B*, CDKN2A, CHEK2*, CTNNA1*, DICER1*, EPCAM (del/dup only), EGFR, FH*, FLCN*, GREM1 (promoter dup only), HOXB13, KIT, LZTR1, MAX*, MBD4, MEN1*, MET, MITF, MLH1*, MSH2*, MSH3*, MSH6*, MUTYH*, NF1*, NF2*, NTHL1*, PALB2*, PDGFRA, PMS2*, POLD1*, POLE*, POT1*, PRKAR1A*, PTCH1*, PTEN*, RAD51C*, RAD51D*, RB1*, RET, SDHA* (sequencing only), SDHAF2*, SDHB*, SDHC*, SDHD*, SMAD4*, SMARCA4*, SMARCB1*, SMARCE1*, STK11*, SUFU*, TMEM127*, TP53*, TSC1*, TSC2*, VHL*. RNA analysis is performed for * genes.   Based on Ms. Crader's personal and family history of cancer, she meets medical criteria for genetic testing. Despite that she meets criteria, she may still have an out of pocket cost. We discussed that if her out of pocket cost for testing is over $100, the laboratory will call and confirm whether she wants to proceed with testing.  If the out of pocket cost of testing is less than $100 she will be billed by the genetic testing  laboratory.   We discussed that some people do not want to undergo genetic testing due to fear of genetic discrimination.  The Genetic Information Nondiscrimination Act (GINA) was signed into federal law in 2008. GINA prohibits health insurers and most employers from discriminating against individuals based on genetic information (including the results of genetic tests and family history information). According to GINA, health insurance companies cannot consider genetic information to be a preexisting condition, nor can they use it to make decisions regarding coverage or rates. GINA also makes it illegal for most employers to use genetic information in making decisions about hiring, firing, promotion, or terms of employment. It is important to note that GINA does not offer protections for life insurance, disability insurance, or long-term care insurance. GINA does  not apply to those in the [employer], those who work for companies with less than 15 employees, and new life insurance or long-term disability insurance policies.  Health status due to a cancer diagnosis is not protected under GINA. More information about GINA can be found by visiting [email or web address].   PLAN: After considering the risks, benefits, and limitations, Ms. Dingledine provided informed consent to pursue genetic testing.  Blood was sent to [facility] for analysis of the Multi-cancer + RNA. Results should be available within approximately 2-3 weeks' time, at which point they will be disclosed by telephone to Ms. Sobek, as will any additional recommendations warranted by these results. Ms. Mesnard will receive a summary of her genetic counseling visit and a copy of her results once available. This information will also be available in Epic.   Lastly, we encouraged Ms. Harney to remain in contact with cancer genetics annually so that we can continuously update the family history and inform her of any changes in cancer genetics and testing that may be of benefit for this family.   Ms. Fiegel questions were answered to her satisfaction today. Our contact information was provided should additional questions or concerns arise. Thank you for the referral and allowing [place] to share in the care of your patient.   Madaline Lefeber P. Lowell Guitar, MS, [facility] Licensed, Patent attorney [email or web address] phone: [phone]  The patient was seen for a total of 30 minutes in telephone genetic counseling.  The patient was seen alone.  Drs. Meliton Rattan, and/or Haswell were available for questions, if needed..    _______________________________________________________________________ For Office Staff:  Number of people involved in session: 1 Was an Intern/ student involved with case: no

## 2022-11-02 ENCOUNTER — Other Ambulatory Visit: Payer: Self-pay

## 2022-11-02 DIAGNOSIS — C569 Malignant neoplasm of unspecified ovary: Secondary | ICD-10-CM

## 2022-11-02 NOTE — Progress Notes (Signed)
Pharmacist Chemotherapy Monitoring - Initial Assessment    Anticipated start date: 11/05/22   The following has been reviewed per standard work regarding the patient's treatment regimen: The patient's diagnosis, treatment plan and drug doses, and organ/hematologic function Lab orders and baseline tests specific to treatment regimen  The treatment plan start date, drug sequencing, and pre-medications Prior authorization status  Patient's documented medication list, including drug-drug interaction screen and prescriptions for anti-emetics and supportive care specific to the treatment regimen The drug concentrations, fluid compatibility, administration routes, and timing of the medications to be used The patient's access for treatment and lifetime cumulative dose history, if applicable  The patient's medication allergies and previous infusion related reactions, if applicable   Changes made to treatment plan:  Mvasi added to plan  Follow up needed:  Pending authorization for treatment    Stephens Shire, [facility], 11/02/2022  9:53 AM

## 2022-11-05 ENCOUNTER — Inpatient Hospital Stay: Payer: Commercial Managed Care - PPO

## 2022-11-05 ENCOUNTER — Inpatient Hospital Stay: Payer: Commercial Managed Care - PPO | Attending: Gynecologic Oncology

## 2022-11-05 DIAGNOSIS — Z923 Personal history of irradiation: Secondary | ICD-10-CM | POA: Diagnosis not present

## 2022-11-05 DIAGNOSIS — Z8541 Personal history of malignant neoplasm of cervix uteri: Secondary | ICD-10-CM | POA: Diagnosis not present

## 2022-11-05 DIAGNOSIS — R634 Abnormal weight loss: Secondary | ICD-10-CM | POA: Diagnosis not present

## 2022-11-05 DIAGNOSIS — E876 Hypokalemia: Secondary | ICD-10-CM | POA: Diagnosis not present

## 2022-11-05 DIAGNOSIS — C482 Malignant neoplasm of peritoneum, unspecified: Secondary | ICD-10-CM | POA: Insufficient documentation

## 2022-11-05 DIAGNOSIS — Z5189 Encounter for other specified aftercare: Secondary | ICD-10-CM | POA: Diagnosis not present

## 2022-11-05 DIAGNOSIS — Z5111 Encounter for antineoplastic chemotherapy: Secondary | ICD-10-CM | POA: Insufficient documentation

## 2022-11-05 DIAGNOSIS — Z86711 Personal history of pulmonary embolism: Secondary | ICD-10-CM | POA: Insufficient documentation

## 2022-11-05 DIAGNOSIS — Z79899 Other long term (current) drug therapy: Secondary | ICD-10-CM | POA: Diagnosis not present

## 2022-11-05 DIAGNOSIS — E119 Type 2 diabetes mellitus without complications: Secondary | ICD-10-CM | POA: Insufficient documentation

## 2022-11-05 DIAGNOSIS — I2699 Other pulmonary embolism without acute cor pulmonale: Secondary | ICD-10-CM | POA: Diagnosis not present

## 2022-11-05 DIAGNOSIS — Z7901 Long term (current) use of anticoagulants: Secondary | ICD-10-CM | POA: Diagnosis not present

## 2022-11-05 DIAGNOSIS — Z9221 Personal history of antineoplastic chemotherapy: Secondary | ICD-10-CM | POA: Insufficient documentation

## 2022-11-05 DIAGNOSIS — C569 Malignant neoplasm of unspecified ovary: Secondary | ICD-10-CM

## 2022-11-05 LAB — URINALYSIS, DIPSTICK ONLY
Bilirubin Urine: NEGATIVE
Glucose, UA: NEGATIVE mg/dL
Hgb urine dipstick: NEGATIVE
Ketones, ur: NEGATIVE mg/dL
Nitrite: NEGATIVE
Protein, ur: 30 mg/dL — AB
Specific Gravity, Urine: 1.03 (ref 1.005–1.030)
pH: 5 (ref 5.0–8.0)

## 2022-11-05 LAB — COMPREHENSIVE METABOLIC PANEL
ALT: 17 U/L (ref 0–44)
AST: 18 U/L (ref 15–41)
Albumin: 3.7 g/dL (ref 3.5–5.0)
Alkaline Phosphatase: 55 U/L (ref 38–126)
Anion gap: 10 (ref 5–15)
BUN: 13 mg/dL (ref 6–20)
CO2: 26 mmol/L (ref 22–32)
Calcium: 9.1 mg/dL (ref 8.9–10.3)
Chloride: 102 mmol/L (ref 98–111)
Creatinine, Ser: 0.72 mg/dL (ref 0.44–1.00)
GFR, Estimated: >60 mL/min (ref >60)
Glucose, Bld: 133 mg/dL — ABNORMAL HIGH (ref 70–99)
Potassium: 3.5 mmol/L (ref 3.5–5.1)
Sodium: 138 mmol/L (ref 135–145)
Total Bilirubin: 1.2 mg/dL (ref 0.3–1.2)
Total Protein: 7.1 g/dL (ref 6.5–8.1)

## 2022-11-05 LAB — CBC WITH DIFFERENTIAL/PLATELET
Abs Immature Granulocytes: 0.01 10*3/uL (ref 0.00–0.07)
Basophils Absolute: 0 10*3/uL (ref 0.0–0.1)
Basophils Relative: 0 %
Eosinophils Absolute: 0.2 10*3/uL (ref 0.0–0.5)
Eosinophils Relative: 3 %
HCT: 37.6 % (ref 36.0–46.0)
Hemoglobin: 12.3 g/dL (ref 12.0–15.0)
Immature Granulocytes: 0 %
Lymphocytes Relative: 23 %
Lymphs Abs: 1.5 10*3/uL (ref 0.7–4.0)
MCH: 32.7 pg (ref 26.0–34.0)
MCHC: 32.7 g/dL (ref 30.0–36.0)
MCV: 100 fL (ref 80.0–100.0)
Monocytes Absolute: 0.5 10*3/uL (ref 0.1–1.0)
Monocytes Relative: 7 %
Neutro Abs: 4.5 10*3/uL (ref 1.7–7.7)
Neutrophils Relative %: 67 %
Platelets: 230 10*3/uL (ref 150–400)
RBC: 3.76 MIL/uL — ABNORMAL LOW (ref 3.87–5.11)
RDW: 11.7 % (ref 11.5–15.5)
WBC: 6.7 10*3/uL (ref 4.0–10.5)
nRBC: 0 % (ref 0.0–0.2)

## 2022-11-05 LAB — MAGNESIUM: Magnesium: 1.6 mg/dL — ABNORMAL LOW (ref 1.7–2.4)

## 2022-11-05 MED ORDER — SODIUM CHLORIDE 0.9 % IV SOLN: Freq: Once | INTRAVENOUS | Status: AC

## 2022-11-05 MED ORDER — MAGNESIUM SULFATE 2 GM/50ML IV SOLN
2.0000 g | Freq: Once | INTRAVENOUS | Status: AC
  Administered 2022-11-05: 2 g via INTRAVENOUS
  Filled 2022-11-05: qty 50

## 2022-11-05 MED ORDER — SODIUM CHLORIDE 0.9 % IV SOLN
140.0000 mg/m2 | Freq: Once | INTRAVENOUS | Status: AC
  Administered 2022-11-05: 276 mg via INTRAVENOUS
  Filled 2022-11-05: qty 46

## 2022-11-05 MED ORDER — SODIUM CHLORIDE 0.9 % IV SOLN
150.0000 mg | Freq: Once | INTRAVENOUS | Status: AC
  Administered 2022-11-05: 150 mg via INTRAVENOUS
  Filled 2022-11-05: qty 150

## 2022-11-05 MED ORDER — SODIUM CHLORIDE 0.9% FLUSH
10.0000 mL | INTRAVENOUS | Status: DC | PRN
  Administered 2022-11-05: 10 mL

## 2022-11-05 MED ORDER — SODIUM CHLORIDE 0.9 % IV SOLN
15.0000 mg/kg | Freq: Once | INTRAVENOUS | Status: AC
  Administered 2022-11-05: 1300 mg via INTRAVENOUS
  Filled 2022-11-05: qty 48

## 2022-11-05 MED ORDER — CETIRIZINE HCL 10 MG/ML IV SOLN
10.0000 mg | Freq: Once | INTRAVENOUS | Status: AC
  Administered 2022-11-05: 10 mg via INTRAVENOUS
  Filled 2022-11-05: qty 1

## 2022-11-05 MED ORDER — PALONOSETRON HCL INJECTION 0.25 MG/5ML
0.2500 mg | Freq: Once | INTRAVENOUS | Status: AC
  Administered 2022-11-05: 0.25 mg via INTRAVENOUS
  Filled 2022-11-05: qty 5

## 2022-11-05 MED ORDER — FAMOTIDINE IN NACL 20-0.9 MG/50ML-% IV SOLN
20.0000 mg | Freq: Once | INTRAVENOUS | Status: AC
  Administered 2022-11-05: 20 mg via INTRAVENOUS
  Filled 2022-11-05: qty 50

## 2022-11-05 MED ORDER — ALTEPLASE 2 MG IJ SOLR
2.0000 mg | Freq: Once | INTRAMUSCULAR | Status: AC
  Administered 2022-11-05: 2 mg
  Filled 2022-11-05: qty 2

## 2022-11-05 MED ORDER — SODIUM CHLORIDE 0.9 % IV SOLN
720.0000 mg | Freq: Once | INTRAVENOUS | Status: AC
  Administered 2022-11-05: 720 mg via INTRAVENOUS
  Filled 2022-11-05: qty 72

## 2022-11-05 MED ORDER — HEPARIN SOD (PORK) LOCK FLUSH 100 UNIT/ML IV SOLN
500.0000 [IU] | Freq: Once | INTRAVENOUS | Status: AC | PRN
  Administered 2022-11-05: 500 [IU]

## 2022-11-05 MED ORDER — SODIUM CHLORIDE 0.9 % IV SOLN
10.0000 mg | Freq: Once | INTRAVENOUS | Status: AC
  Administered 2022-11-05: 10 mg via INTRAVENOUS
  Filled 2022-11-05: qty 1

## 2022-11-05 NOTE — Patient Instructions (Signed)
[facility]  Discharge Instructions: Thank you for choosing [facility] to provide your oncology and hematology care.  If you have a lab appointment with the Cancer Center - please note that after April 8th, 2024, all labs will be drawn in the cancer center.  You do not have to check in or register with the main entrance as you have in the past but will complete your check-in in the cancer center.  Wear comfortable clothing and clothing appropriate for easy access to any Portacath or PICC line.   We strive to give you quality time with your provider. You may need to reschedule your appointment if you arrive late (15 or more minutes).  Arriving late affects you and other patients whose appointments are after yours.  Also, if you miss three or more appointments without notifying the office, you may be dismissed from the clinic at the provider's discretion.      For prescription refill requests, have your pharmacy contact our office and allow 72 hours for refills to be completed.    Today you received the following chemotherapy and/or immunotherapy agents MVASI/Taxol/Carboplatin.   Bevacizumab Injection What is this medication? BEVACIZUMAB (be va SIZ yoo mab) treats some types of cancer. It works by blocking a protein that causes cancer cells to grow and multiply. This helps to slow or stop the spread of cancer cells. It is a monoclonal antibody. This medicine may be used for other purposes; ask your health care provider or pharmacist if you have questions. COMMON BRAND NAME(S): Alymsys, Avastin, MVASI, Omer Jack What should I tell my care team before I take this medication? They need to know if you have any of these conditions: Blood clots Coughing up blood Having or recent surgery Heart failure High blood pressure History of a connection between 2 or more body parts that do not usually connect (fistula) History of a tear in your stomach or  intestines Protein in your urine An unusual or allergic reaction to bevacizumab, other medications, foods, dyes, or preservatives Pregnant or trying to get pregnant Breast-feeding How should I use this medication? This medication is injected into a vein. It is given by your care team in a hospital or clinic setting. Talk to your care team the use of this medication in children. Special care may be needed. Overdosage: If you think you have taken too much of this medicine contact a poison control center or emergency room at once. NOTE: This medicine is only for you. Do not share this medicine with others. What if I miss a dose? Keep appointments for follow-up doses. It is important not to miss your dose. Call your care team if you are unable to keep an appointment. What may interact with this medication? Interactions are not expected. This list may not describe all possible interactions. Give your health care provider a list of all the medicines, herbs, non-prescription drugs, or dietary supplements you use. Also tell them if you smoke, drink alcohol, or use illegal drugs. Some items may interact with your medicine. What should I watch for while using this medication? Your condition will be monitored carefully while you are receiving this medication. You may need blood work while taking this medication. This medication may make you feel generally unwell. This is not uncommon as chemotherapy can affect healthy cells as well as cancer cells. Report any side effects. Continue your course of treatment even though you feel ill unless your care team tells you to stop.  This medication may increase your risk to bruise or bleed. Call your care team if you notice any unusual bleeding. Before having surgery, talk to your care team to make sure it is ok. This medication can increase the risk of poor healing of your surgical site or wound. You will need to stop this medication for 28 days before surgery. After  surgery, wait at least 28 days before restarting this medication. Make sure the surgical site or wound is healed enough before restarting this medication. Talk to your care team if questions. Talk to your care team if you may be pregnant. Serious birth defects can occur if you take this medication during pregnancy and for 6 months after the last dose. Contraception is recommended while taking this medication and for 6 months after the last dose. Your care team can help you find the option that works for you. Do not breastfeed while taking this medication and for 6 months after the last dose. This medication can cause infertility. Talk to your care team if you are concerned about your fertility. What side effects may I notice from receiving this medication? Side effects that you should report to your care team as soon as possible: Allergic reactions--skin rash, itching, hives, swelling of the face, lips, tongue, or throat Bleeding--bloody or black, tar-like stools, vomiting blood or Madelin Weseman material that looks like coffee grounds, red or dark Tarissa Kerin urine, small red or purple spots on skin, unusual bruising or bleeding Blood clot--pain, swelling, or warmth in the leg, shortness of breath, chest pain Heart attack--pain or tightness in the chest, shoulders, arms, or jaw, nausea, shortness of breath, cold or clammy skin, feeling faint or lightheaded Heart failure--shortness of breath, swelling of the ankles, feet, or hands, sudden weight gain, unusual weakness or fatigue Increase in blood pressure Infection--fever, chills, cough, sore throat, wounds that don't heal, pain or trouble when passing urine, general feeling of discomfort or being unwell Infusion reactions--chest pain, shortness of breath or trouble breathing, feeling faint or lightheaded Kidney injury--decrease in the amount of urine, swelling of the ankles, hands, or feet Stomach pain that is severe, does not go away, or gets worse Stroke--sudden  numbness or weakness of the face, arm, or leg, trouble speaking, confusion, trouble walking, loss of balance or coordination, dizziness, severe headache, change in vision Sudden and severe headache, confusion, change in vision, seizures, which may be signs of posterior reversible encephalopathy syndrome (PRES) Side effects that usually do not require medical attention (report to your care team if they continue or are bothersome): Back pain Change in taste Diarrhea Dry skin Increased tears Nosebleed This list may not describe all possible side effects. Call your doctor for medical advice about side effects. You may report side effects to FDA at 1-800-FDA-1088. Where should I keep my medication? This medication is given in a hospital or clinic. It will not be stored at home. NOTE: This sheet is a summary. It may not cover all possible information. If you have questions about this medicine, talk to your doctor, pharmacist, or health care provider.  2024 Elsevier/Gold Standard (2021-10-06 00:00:00)    Paclitaxel Injection What is this medication? PACLITAXEL (PAK li TAX el) treats some types of cancer. It works by slowing down the growth of cancer cells. This medicine may be used for other purposes; ask your health care provider or pharmacist if you have questions. COMMON BRAND NAME(S): Onxol, Taxol What should I tell my care team before I take this medication? They need to  know if you have any of these conditions: Heart disease Liver disease Low white blood cell levels An unusual or allergic reaction to paclitaxel, other medications, foods, dyes, or preservatives If you or your partner are pregnant or trying to get pregnant Breast-feeding How should I use this medication? This medication is injected into a vein. It is given by your care team in a hospital or clinic setting. Talk to your care team about the use of this medication in children. While it may be given to children for selected  conditions, precautions do apply. Overdosage: If you think you have taken too much of this medicine contact a poison control center or emergency room at once. NOTE: This medicine is only for you. Do not share this medicine with others. What if I miss a dose? Keep appointments for follow-up doses. It is important not to miss your dose. Call your care team if you are unable to keep an appointment. What may interact with this medication? Do not take this medication with any of the following: Live virus vaccines Other medications may affect the way this medication works. Talk with your care team about all of the medications you take. They may suggest changes to your treatment plan to lower the risk of side effects and to make sure your medications work as intended. This list may not describe all possible interactions. Give your health care provider a list of all the medicines, herbs, non-prescription drugs, or dietary supplements you use. Also tell them if you smoke, drink alcohol, or use illegal drugs. Some items may interact with your medicine. What should I watch for while using this medication? Your condition will be monitored carefully while you are receiving this medication. You may need blood work while taking this medication. This medication may make you feel generally unwell. This is not uncommon as chemotherapy can affect healthy cells as well as cancer cells. Report any side effects. Continue your course of treatment even though you feel ill unless your care team tells you to stop. This medication can cause serious allergic reactions. To reduce the risk, your care team may give you other medications to take before receiving this one. Be sure to follow the directions from your care team. This medication may increase your risk of getting an infection. Call your care team for advice if you get a fever, chills, sore throat, or other symptoms of a cold or flu. Do not treat yourself. Try to avoid  being around people who are sick. This medication may increase your risk to bruise or bleed. Call your care team if you notice any unusual bleeding. Be careful brushing or flossing your teeth or using a toothpick because you may get an infection or bleed more easily. If you have any dental work done, tell your dentist you are receiving this medication. Talk to your care team if you may be pregnant. Serious birth defects can occur if you take this medication during pregnancy. Talk to your care team before breastfeeding. Changes to your treatment plan may be needed. What side effects may I notice from receiving this medication? Side effects that you should report to your care team as soon as possible: Allergic reactions--skin rash, itching, hives, swelling of the face, lips, tongue, or throat Heart rhythm changes--fast or irregular heartbeat, dizziness, feeling faint or lightheaded, chest pain, trouble breathing Increase in blood pressure Infection--fever, chills, cough, sore throat, wounds that don't heal, pain or trouble when passing urine, general feeling of discomfort or being unwell  Low blood pressure--dizziness, feeling faint or lightheaded, blurry vision Low red blood cell level--unusual weakness or fatigue, dizziness, headache, trouble breathing Painful swelling, warmth, or redness of the skin, blisters or sores at the infusion site Pain, tingling, or numbness in the hands or feet Slow heartbeat--dizziness, feeling faint or lightheaded, confusion, trouble breathing, unusual weakness or fatigue Unusual bruising or bleeding Side effects that usually do not require medical attention (report to your care team if they continue or are bothersome): Diarrhea Hair loss Joint pain Loss of appetite Muscle pain Nausea Vomiting This list may not describe all possible side effects. Call your doctor for medical advice about side effects. You may report side effects to FDA at 1-800-FDA-1088. Where  should I keep my medication? This medication is given in a hospital or clinic. It will not be stored at home. NOTE: This sheet is a summary. It may not cover all possible information. If you have questions about this medicine, talk to your doctor, pharmacist, or health care provider.  2024 Elsevier/Gold Standard (2021-10-10 00:00:00)    Carboplatin Injection What is this medication? CARBOPLATIN (KAR boe pla tin) treats some types of cancer. It works by slowing down the growth of cancer cells. This medicine may be used for other purposes; ask your health care provider or pharmacist if you have questions. COMMON BRAND NAME(S): Paraplatin What should I tell my care team before I take this medication? They need to know if you have any of these conditions: Blood disorders Hearing problems Kidney disease Recent or ongoing radiation therapy An unusual or allergic reaction to carboplatin, cisplatin, other medications, foods, dyes, or preservatives Pregnant or trying to get pregnant Breast-feeding How should I use this medication? This medication is injected into a vein. It is given by your care team in a hospital or clinic setting. Talk to your care team about the use of this medication in children. Special care may be needed. Overdosage: If you think you have taken too much of this medicine contact a poison control center or emergency room at once. NOTE: This medicine is only for you. Do not share this medicine with others. What if I miss a dose? Keep appointments for follow-up doses. It is important not to miss your dose. Call your care team if you are unable to keep an appointment. What may interact with this medication? Medications for seizures Some antibiotics, such as amikacin, gentamicin, neomycin, streptomycin, tobramycin Vaccines This list may not describe all possible interactions. Give your health care provider a list of all the medicines, herbs, non-prescription drugs, or dietary  supplements you use. Also tell them if you smoke, drink alcohol, or use illegal drugs. Some items may interact with your medicine. What should I watch for while using this medication? Your condition will be monitored carefully while you are receiving this medication. You may need blood work while taking this medication. This medication may make you feel generally unwell. This is not uncommon, as chemotherapy can affect healthy cells as well as cancer cells. Report any side effects. Continue your course of treatment even though you feel ill unless your care team tells you to stop. In some cases, you may be given additional medications to help with side effects. Follow all directions for their use. This medication may increase your risk of getting an infection. Call your care team for advice if you get a fever, chills, sore throat, or other symptoms of a cold or flu. Do not treat yourself. Try to avoid being around people  who are sick. Avoid taking medications that contain aspirin, acetaminophen, ibuprofen, naproxen, or ketoprofen unless instructed by your care team. These medications may hide a fever. Be careful brushing or flossing your teeth or using a toothpick because you may get an infection or bleed more easily. If you have any dental work done, tell your dentist you are receiving this medication. Talk to your care team if you wish to become pregnant or think you might be pregnant. This medication can cause serious birth defects. Talk to your care team about effective forms of contraception. Do not breast-feed while taking this medication. What side effects may I notice from receiving this medication? Side effects that you should report to your care team as soon as possible: Allergic reactions--skin rash, itching, hives, swelling of the face, lips, tongue, or throat Infection--fever, chills, cough, sore throat, wounds that don't heal, pain or trouble when passing urine, general feeling of  discomfort or being unwell Low red blood cell level--unusual weakness or fatigue, dizziness, headache, trouble breathing Pain, tingling, or numbness in the hands or feet, muscle weakness, change in vision, confusion or trouble speaking, loss of balance or coordination, trouble walking, seizures Unusual bruising or bleeding Side effects that usually do not require medical attention (report to your care team if they continue or are bothersome): Hair loss Nausea Unusual weakness or fatigue Vomiting This list may not describe all possible side effects. Call your doctor for medical advice about side effects. You may report side effects to FDA at 1-800-FDA-1088. Where should I keep my medication? This medication is given in a hospital or clinic. It will not be stored at home. NOTE: This sheet is a summary. It may not cover all possible information. If you have questions about this medicine, talk to your doctor, pharmacist, or health care provider.  2024 Elsevier/Gold Standard (2021-09-12 00:00:00)        To help prevent nausea and vomiting after your treatment, we encourage you to take your nausea medication as directed.  BELOW ARE SYMPTOMS THAT SHOULD BE REPORTED IMMEDIATELY: *FEVER GREATER THAN 100.4 F (38 C) OR HIGHER *CHILLS OR SWEATING *NAUSEA AND VOMITING THAT IS NOT CONTROLLED WITH YOUR NAUSEA MEDICATION *UNUSUAL SHORTNESS OF BREATH *UNUSUAL BRUISING OR BLEEDING *URINARY PROBLEMS (pain or burning when urinating, or frequent urination) *BOWEL PROBLEMS (unusual diarrhea, constipation, pain near the anus) TENDERNESS IN MOUTH AND THROAT WITH OR WITHOUT PRESENCE OF ULCERS (sore throat, sores in mouth, or a toothache) UNUSUAL RASH, SWELLING OR PAIN  UNUSUAL VAGINAL DISCHARGE OR ITCHING   Items with * indicate a potential emergency and should be followed up as soon as possible or go to the Emergency Department if any problems should occur.  Please show the CHEMOTHERAPY ALERT CARD or  IMMUNOTHERAPY ALERT CARD at check-in to the Emergency Department and triage nurse.  Should you have questions after your visit or need to cancel or reschedule your appointment, please contact [facility] [phone]  and follow the prompts.  Office hours are 8:00 a.m. to 4:30 p.m. Monday - Friday. Please note that voicemails left after 4:00 p.m. may not be returned until the following business day.  We are closed weekends and major holidays. You have access to a nurse at all times for urgent questions. Please call the main number to the clinic [phone] and follow the prompts.  For any non-urgent questions, you may also contact your provider using MyChart. We now offer e-Visits for anyone 26 and older to request care online for  non-urgent symptoms. For details visit [email or web address].   Also download the MyChart app! Go to the app store, search "MyChart", open the app, select [place], and log in with your MyChart username and password.

## 2022-11-05 NOTE — Progress Notes (Signed)
Patient presents today for chemotherapy infusion.  Patient is in satisfactory condition with only complaints of recent diarrhea voiced.  Diarrhea has resolved now.  Vital signs are stable.  Labs reviewed and all labs are within treatment parameters.  Magnesium today is 1.6.  We will give Magnesium 2 gram IV x one dose today per standing orders by Dr. Ellin Saba.  Urine protein today is 30.  No blood return was initially noted when collecting labs. Alteplase was administered.  CXR from 02/16/22 noted that port was slightly retracted from prior exam.  Ok to use per Dr. Ellin Saba.  We will proceed with treatment per MD orders.   Patient tolerated treatment well with no complaints voiced.  Patient left ambulatory in stable condition.  Vital signs stable at discharge.  Follow up as scheduled.

## 2022-11-06 ENCOUNTER — Encounter: Payer: Self-pay | Admitting: Genetic Counselor

## 2022-11-06 ENCOUNTER — Telehealth: Payer: Self-pay | Admitting: Genetic Counselor

## 2022-11-06 DIAGNOSIS — Z1379 Encounter for other screening for genetic and chromosomal anomalies: Secondary | ICD-10-CM | POA: Insufficient documentation

## 2022-11-06 DIAGNOSIS — Z7189 Other specified counseling: Secondary | ICD-10-CM | POA: Insufficient documentation

## 2022-11-06 NOTE — Progress Notes (Signed)
24 HOUR CHEMOTHERAPY CALL BACK:  No answer.  Voice mail not set up.

## 2022-11-06 NOTE — Telephone Encounter (Signed)
Called with genetic test results.  VM box has not been set up yet and could not leave a message.

## 2022-11-07 ENCOUNTER — Inpatient Hospital Stay: Payer: Commercial Managed Care - PPO

## 2022-11-07 VITALS — BP 142/70 | HR 94 | Temp 97.5°F | Resp 18

## 2022-11-07 DIAGNOSIS — C482 Malignant neoplasm of peritoneum, unspecified: Secondary | ICD-10-CM

## 2022-11-07 DIAGNOSIS — Z5111 Encounter for antineoplastic chemotherapy: Secondary | ICD-10-CM | POA: Diagnosis not present

## 2022-11-07 MED ORDER — PEGFILGRASTIM INJECTION 6 MG/0.6ML ~~LOC~~
6.0000 mg | PREFILLED_SYRINGE | Freq: Once | SUBCUTANEOUS | Status: AC
  Administered 2022-11-07: 6 mg via SUBCUTANEOUS
  Filled 2022-11-07: qty 0.6

## 2022-11-07 NOTE — Patient Instructions (Signed)
[facility]  Discharge Instructions: Thank you for choosing [facility] to provide your oncology and hematology care.  If you have a lab appointment with the Cancer Center - please note that after April 8th, 2024, all labs will be drawn in the cancer center.  You do not have to check in or register with the main entrance as you have in the past but will complete your check-in in the cancer center.  Wear comfortable clothing and clothing appropriate for easy access to any Portacath or PICC line.   We strive to give you quality time with your provider. You may need to reschedule your appointment if you arrive late (15 or more minutes).  Arriving late affects you and other patients whose appointments are after yours.  Also, if you miss three or more appointments without notifying the office, you may be dismissed from the clinic at the provider's discretion.      For prescription refill requests, have your pharmacy contact our office and allow 72 hours for refills to be completed.    Today you received the following pegfilgrastim, return as scheduled.  To help prevent nausea and vomiting after your treatment, we encourage you to take your nausea medication as directed.  BELOW ARE SYMPTOMS THAT SHOULD BE REPORTED IMMEDIATELY: *FEVER GREATER THAN 100.4 F (38 C) OR HIGHER *CHILLS OR SWEATING *NAUSEA AND VOMITING THAT IS NOT CONTROLLED WITH YOUR NAUSEA MEDICATION *UNUSUAL SHORTNESS OF BREATH *UNUSUAL BRUISING OR BLEEDING *URINARY PROBLEMS (pain or burning when urinating, or frequent urination) *BOWEL PROBLEMS (unusual diarrhea, constipation, pain near the anus) TENDERNESS IN MOUTH AND THROAT WITH OR WITHOUT PRESENCE OF ULCERS (sore throat, sores in mouth, or a toothache) UNUSUAL RASH, SWELLING OR PAIN  UNUSUAL VAGINAL DISCHARGE OR ITCHING   Items with * indicate a potential emergency and should be followed up as soon as possible or go to the Emergency Department  if any problems should occur.  Please show the CHEMOTHERAPY ALERT CARD or IMMUNOTHERAPY ALERT CARD at check-in to the Emergency Department and triage nurse.  Should you have questions after your visit or need to cancel or reschedule your appointment, please contact [facility] [phone]  and follow the prompts.  Office hours are 8:00 a.m. to 4:30 p.m. Monday - Friday. Please note that voicemails left after 4:00 p.m. may not be returned until the following business day.  We are closed weekends and major holidays. You have access to a nurse at all times for urgent questions. Please call the main number to the clinic [phone] and follow the prompts.  For any non-urgent questions, you may also contact your provider using MyChart. We now offer e-Visits for anyone 24 and older to request care online for non-urgent symptoms. For details visit [email or web address].   Also download the MyChart app! Go to the app store, search "MyChart", open the app, select [place], and log in with your MyChart username and password.

## 2022-11-07 NOTE — Telephone Encounter (Signed)
Late entry: Patient accessed for 24-hour follow up after treatment. Patient states no issues from new treatment, will call cancer center if symptoms occur.

## 2022-11-07 NOTE — Progress Notes (Signed)
Patient tolerated injection with no complaints voiced. Site clean and dry with no bruising or swelling noted at site. See MAR for details. Band aid applied.  Patient stable during and after injection. VSS with discharge and left in satisfactory condition with no s/s of distress noted.  

## 2022-11-09 ENCOUNTER — Encounter: Payer: Self-pay | Admitting: Gynecologic Oncology

## 2022-11-12 ENCOUNTER — Encounter (HOSPITAL_COMMUNITY): Payer: Self-pay

## 2022-11-12 ENCOUNTER — Telehealth: Payer: Self-pay | Admitting: Gynecologic Oncology

## 2022-11-12 ENCOUNTER — Encounter: Payer: Self-pay | Admitting: Gynecologic Oncology

## 2022-11-12 ENCOUNTER — Encounter: Payer: Self-pay | Admitting: *Deleted

## 2022-11-12 NOTE — Telephone Encounter (Signed)
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 No answer, unable to leave a voicemail. Mychart message sent with this information.  Eugene Garnet MD Gynecologic Oncology

## 2022-11-13 ENCOUNTER — Other Ambulatory Visit: Payer: Self-pay

## 2022-11-15 ENCOUNTER — Encounter: Payer: Self-pay | Admitting: Gynecologic Oncology

## 2022-11-15 ENCOUNTER — Ambulatory Visit: Payer: Self-pay | Admitting: Genetic Counselor

## 2022-11-15 ENCOUNTER — Telehealth: Payer: Self-pay | Admitting: Gynecologic Oncology

## 2022-11-15 ENCOUNTER — Encounter: Payer: Self-pay | Admitting: Hematology

## 2022-11-15 DIAGNOSIS — Z1379 Encounter for other screening for genetic and chromosomal anomalies: Secondary | ICD-10-CM

## 2022-11-15 NOTE — Telephone Encounter (Signed)
I called to discuss her recent genomic testing results.  We discussed the implication of these results.  This would mean that she is a candidate for either PARP inhibitor therapy alone or in combination with bevacizumab after she finishes upfront chemotherapy.  Patient was sent some information about PARP inhibitors in MyChart and we reviewed some of the more common side effects today.  All questions answered.  Eugene Garnet MD Gynecologic Oncology

## 2022-11-15 NOTE — Telephone Encounter (Signed)
Revealed negative genetic testing.  Discussed that we do not know why she has ovarian cancer or why there is cancer in the family. It could be due to a different gene that we are not testing, or maybe our current technology may not be able to pick something up.  It will be important for her to keep in contact with genetics to keep up with whether additional testing may be needed.   

## 2022-11-15 NOTE — Progress Notes (Signed)
HPI:  Ms. Rinella was previously seen in the [facility] due to a personal and family history of cancer and concerns regarding a hereditary predisposition to cancer. Please refer to our prior cancer genetics clinic note for more information regarding our discussion, assessment and recommendations, at the time. Ms. Griffitts recent genetic test results were disclosed to her, as were recommendations warranted by these results. These results and recommendations are discussed in more detail below.  CANCER HISTORY:  Oncology History  Primary peritoneal carcinomatosis (HCC)  11/13/2021 Imaging   CT A/P: 1. Trace ascites throughout the abdomen and pelvis, with extensive peritoneal thickening, nodularity, and omental caking. Findings are consistent with widespread peritoneal and omental metastatic disease. 2. No evidence of primary malignancy in the chest, abdomen, or pelvis, with specific attention to the most likely etiologies of peritoneal metastatic disease including pancreatic, ovarian, and endometrial malignancy. Although there is no obvious mass, pelvic ultrasound may be helpful to more closely assess the female reproductive organs. 3. Small left pleural effusion and associated atelectasis or consolidation, nonspecific although worrisome for malignant effusion. No overt pleural thickening or nodularity. 4. Scattered areas of nonspecific infectious or inflammatory alveolar ground-glass airspace opacity throughout the lungs. 5. Cholelithiasis without evidence of acute cholecystitis. 6. Nonobstructive right nephrolithiasis. 7. Postoperative findings of same day laparoscopy including minimal pneumoperitoneum.   Aortic Atherosclerosis (ICD10-I70.0).   11/13/2021 Surgery   Diagnostic laparoscopy with biopsy Dr. Lovell Sheehan Findings: Significant bloody ascites, studding of the omentum as well as the upper abdominal wall and diaphragm.  Nodularity noted along the small intestine.   Omental disease inhibited view of the pelvis.   11/17/2021 Initial Diagnosis   Primary peritoneal carcinomatosis (HCC)   11/21/2021 - 01/25/2022 Chemotherapy   Patient is on Treatment Plan : OVARIAN Carboplatin (AUC 6) / Paclitaxel (175) q21d x 6 cycles     11/21/2021 -  Chemotherapy   Patient is on Treatment Plan : OVARIAN Carboplatin (AUC 6) + Paclitaxel (175)  + Mvasi q21d X 6 Cycles     01/19/2022 Imaging   CT A/P: 1. Small amount of adherent thrombus or fibrin sheath around the catheter in the LEFT brachiocephalic vein. 2. Small low-attenuation area in the inter lobar fissure region associated with medial segment of LEFT hepatic lobe, new from previous imaging, may represent developing "variable fat deposition" though given history of peritoneal disease would suggest attention on subsequent imaging. 3. Diminished thickening of mesenteric reflections, decreased ascites and decreased omental infiltration since previous imaging. 4. Focal concentric narrowing of the rectosigmoid junction is nonspecific but suspicious for neoplasm potentially related to serosal involvement in the pelvis in this patient with known peritoneal disease from ovarian cancer. Underlying colonic neoplasm would be difficult to exclude as well. Colonic assessment with endoscopy or comparison with recent endoscopy performed is suggested. 5. Cholelithiasis without evidence of acute cholecystitis. 6. 4 mm RIGHT lower pole renal calculus.   Aortic Atherosclerosis (ICD10-I70.0).   03/06/2022 Imaging   CT C/A/P: Chest CT:   Scattered subsegmental pulmonary emboli in the right lung.   Abdominal CT:   1. Mild right hydroureteronephrosis from a 4 mm UVJ calculus. 2. Unchanged omental disease. 3. Cholelithiasis and other chronic findings described above.   05/24/2022 Imaging   Increased peritoneal carcinomatosis and mild ascites.   New moderate distal small bowel obstruction, with transition point in the  central pelvis adjacent to the uterine fundus, which may be due to peritoneal carcinomatosis or adhesion.   Cholelithiasis. No radiographic evidence of cholecystitis.  Aortic Atherosclerosis (ICD10-I70.0).   08/21/2022 Imaging   CT A/P: 1. Improved peritoneal carcinomatosis and mild ascites. 2. Prominent/mildly enlarged loops of small bowel throughout the abdomen with transition point in the pelvis, not significantly changed from prior and compatible with a least low-grade obstruction. 3. Cholelithiasis without findings of acute cholecystitis.   10/01/2022 Surgery   Diagnostic laparoscopy, robotic assisted laparoscopic lysis of adhesions, exploratory laparotomy, omentectomy   Findings: On EUA, small uterus within minimal mobility. On intra-abdominal entry, omentum adherent to the anterior abdominal wall at the level of the transverse colon. Some adhesions noted between the anterior liver surface and the diaphragm on the right. Small miliary disease noted on bilateral diaphragms, small bowel, small bowel mesentery, abdominal and pelvic peritoneum. No ascites. Complete retroperitonealization of the pelvic organs. Multiple loops of small bowel were densely adherent to the uterine fundus without obvious obstruction. Terminal ileum and cecum also adherent along the right pelvic sidewall. Sigmoid colon densely adherent to a loop of ileum and to the lateral aspect of the retroperitonealized pelvis on the left. After opening the retroperitoneum bilaterally and transecting the round ligaments, the bladder flap was able to be partially developed anteriorly. This revealed dense adhesions of the sigmoid to the lateral uterus. Given prior discussions with the patient, I did not feel that proceeding with further attempt at lysis of adhesions (some mobilization of two loops of ileum adherent to the uterine fundus was performed) would be possible without significant risk of injury to multiple loops of small bowel  and to the sigmoid colon. 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 On laparotomy, 8 x 4 cm omental cake noted. No disease visualized or palpated within the lesser sac. There was no palpable disease along the liver edges or spleen. Stomach was normal in appearance. Some miliary disease palpated along the abdominal peritoneum. Numerous miliary disease noted along the small bowel and small bowel mesentery. Numerous prominent lymph nodes (1cm or less) within the stalk of the small bowel mesentery.  R1 resection based on miliary disease (in the setting of incomplete pelvic evaluation).   10/01/2022 Pathology Results   A. Pelvic sidewall, right, excision - High-grade serous carcinoma   B. Omentum, resection - High-grade serous carcinoma - Treatment effect present - See comment   C. Small bowel, implant, excision - Infarcted epiploic appendage - Negative for carcinoma  Immunohistochemical stains were performed on block B1. The tumor cells are positive for WT1, compatible with tubo-ovarian or peritoneal origin. p53 demonstrates faint positivity in rare cells, favored to be most consistent with a null pattern.    11/05/2022 Genetic Testing   Negative genetic testing on the [facility] Multi-cancer + RNA panel.  The report date is November 05, 2022.  The Multi-Cancer + RNA Panel offered by [facility] includes sequencing and/or deletion/duplication analysis of the following 70 genes:  AIP*, ALK, APC*, ATM*, AXIN2*, BAP1*, BARD1*, BLM*, BMPR1A*, BRCA1*, BRCA2*, BRIP1*, CDC73*, CDH1*, CDK4, CDKN1B*, CDKN2A, CHEK2*, CTNNA1*, DICER1*, EPCAM (del/dup only), EGFR, FH*, FLCN*, GREM1 (promoter dup only), HOXB13, KIT, LZTR1, MAX*, MBD4, MEN1*, MET, MITF, MLH1*, MSH2*, MSH3*, MSH6*, MUTYH*, NF1*, NF2*, NTHL1*, PALB2*, PDGFRA, PMS2*, POLD1*, POLE*, POT1*, PRKAR1A*, PTCH1*, PTEN*, RAD51C*, RAD51D*, RB1*, RET, SDHA* (sequencing only), SDHAF2*, SDHB*, SDHC*, SDHD*, SMAD4*, SMARCA4*,  SMARCB1*, SMARCE1*, STK11*, SUFU*, TMEM127*, TP53*, TSC1*, TSC2*, VHL*. RNA analysis is performed for * genes.      FAMILY HISTORY:  We obtained a detailed, 4-generation family history.  Significant diagnoses are listed below: Family History  Problem Relation Age of Onset   Hypertension Mother    Heart attack Paternal Uncle    Bladder Cancer Maternal Grandmother 36       non-smoker   Dementia Maternal Grandfather    Breast cancer Maternal Great-grandmother        MGF's mother   Colon cancer Neg Hx    Ovarian cancer Neg Hx    Endometrial cancer Neg Hx    Pancreatic cancer Neg Hx    Prostate cancer Neg Hx        The patient has a son and daughter who are cancer free.  She has one brother who is cancer free.  Her father is deceased and her mother is living.   The patient's father died of a GSW.  He had a brother and sister who were cancer free. The paternal grandparents died of natural causes.   The patient's mother does not have cancer. She has one sister and three deceased brothers.  None were reported to have cancer.  The maternal grandmother was diagnosed with bladder cancer at 64.  The grandfather died of demential.  His mother had breast cancer.   Ms. Busbin is unaware of previous family history of genetic testing for hereditary cancer risks.  There is no reported Ashkenazi Jewish ancestry. There is no known consanguinity  GENETIC TEST RESULTS: Genetic testing reported out on November 06, 2022 through the Multi-cancer+RNA cancer panel found no pathogenic mutations. The Multi-Cancer + RNA Panel offered by [facility] includes sequencing and/or deletion/duplication analysis of the following 70 genes:  AIP*, ALK, APC*, ATM*, AXIN2*, BAP1*, BARD1*, BLM*, BMPR1A*, BRCA1*, BRCA2*, BRIP1*, CDC73*, CDH1*, CDK4, CDKN1B*, CDKN2A, CHEK2*, CTNNA1*, DICER1*, EPCAM (del/dup only), EGFR, FH*, FLCN*, GREM1 (promoter dup only), HOXB13, KIT, LZTR1, MAX*, MBD4, MEN1*, MET, MITF, MLH1*, MSH2*, MSH3*, MSH6*,  MUTYH*, NF1*, NF2*, NTHL1*, PALB2*, PDGFRA, PMS2*, POLD1*, POLE*, POT1*, PRKAR1A*, PTCH1*, PTEN*, RAD51C*, RAD51D*, RB1*, RET, SDHA* (sequencing only), SDHAF2*, SDHB*, SDHC*, SDHD*, SMAD4*, SMARCA4*, SMARCB1*, SMARCE1*, STK11*, SUFU*, TMEM127*, TP53*, TSC1*, TSC2*, VHL*. RNA analysis is performed for * genes. The test report has been scanned into EPIC and is located under the Molecular Pathology section of the Results Review tab.  A portion of the result report is included below for reference.     We discussed with Ms. Fleisher that because current genetic testing is not perfect, it is possible there may be a gene mutation in one of these genes that current testing cannot detect, but that chance is small.  We also discussed, that there could be another gene that has not yet been discovered, or that we have not yet tested, that is responsible for the cancer diagnoses in the family. It is also possible there is a hereditary cause for the cancer in the family that Ms. Tomasello did not inherit and therefore was not identified in her testing.  Therefore, it is important to remain in touch with cancer genetics in the future so that we can continue to offer Ms. Yerger the most up to date genetic testing.   ADDITIONAL GENETIC TESTING: We discussed with Ms. Farney that her genetic testing was fairly extensive.  If there are genes identified to increase cancer risk that can be analyzed in the future, we would be happy to discuss and coordinate this testing at that time.    CANCER SCREENING RECOMMENDATIONS: Ms. Petrini test result is considered negative (normal).  This means that we have not identified a hereditary cause for her personal and family history of cancer at  this time. Most cancers happen by chance and this negative test suggests that her cancer may fall into this category.    Possible reasons for Ms. Swarthout's negative genetic test include:  1. There may be a gene mutation in one of these genes that current  testing methods cannot detect but that chance is small.  2. There could be another gene that has not yet been discovered, or that we have not yet tested, that is responsible for the cancer diagnoses in the family.  3.  There may be no hereditary risk for cancer in the family. The cancers in Ms. Schwier and/or her family may be sporadic/familial or due to other genetic and environmental factors. 4. It is also possible there is a hereditary cause for the cancer in the family that Ms. Harston did not inherit.  Therefore, it is recommended she continue to follow the cancer management and screening guidelines provided by her oncology and primary healthcare provider. An individual's cancer risk and medical management are not determined by genetic test results alone. Overall cancer risk assessment incorporates additional factors, including personal medical history, family history, and any available genetic information that may result in a personalized plan for cancer prevention and surveillance  RECOMMENDATIONS FOR FAMILY MEMBERS:  Individuals in this family might be at some increased risk of developing cancer, over the general population risk, simply due to the family history of cancer.  We recommended women in this family have a yearly mammogram beginning at age 31, or 53 years younger than the earliest onset of cancer, an annual clinical breast exam, and perform monthly breast self-exams. Women in this family should also have a gynecological exam as recommended by their primary provider. All family members should be referred for colonoscopy starting at age 27.  FOLLOW-UP: Lastly, we discussed with Ms. Helbing that cancer genetics is a rapidly advancing field and it is possible that new genetic tests will be appropriate for her and/or her family members in the future. We encouraged her to remain in contact with cancer genetics on an annual basis so we can update her personal and family histories and let her know of  advances in cancer genetics that may benefit this family.   Our contact number was provided. Ms. Billingsly questions were answered to her satisfaction, and she knows she is welcome to call us at anytime with additional questions or concerns.   Maylon Cos, MS, [facility] Licensed, Certified Genetic Counselor [email or web address]

## 2022-11-18 ENCOUNTER — Other Ambulatory Visit: Payer: Self-pay | Admitting: Hematology

## 2022-11-19 ENCOUNTER — Encounter: Payer: Self-pay | Admitting: Hematology

## 2022-11-25 NOTE — Progress Notes (Signed)
[facility] [address] [name] [address]    Clinic Day:  11/26/2022  Referring physician: [name], DO  Patient Care Team: [name], DO as PCP - General (Family Medicine) [name], MD as Referring Physician (Optometry) [name], MD as Medical Oncologist (Medical Oncology) [name], RN as Oncology Nurse Navigator (Medical Oncology)   ASSESSMENT & PLAN:   Assessment: 1. High-grade serous carcinoma of peritoneum: - She had developed sharp left-sided abdominal pain since Mother's Day, occasional radiating to the back.  40 pound weight loss in the last 6 months. - Personal history of cervical cancer in 2000, status post chemo plus XRT at [facility].  She has slightly decreased hearing in the left ear since then. - US abdomen (11/08/2021): Cholelithiasis with gallbladder wall thickening and sonographic Murphy sign.  Upper abdominal ascites. - Laparoscopy (11/13/2021): Starting of the omentum as well as the upper abdominal wall and diaphragm.  Liver was within normal limits.  6 L of ascites was evacuated.  Omental and peritoneal biopsies were sent.  Peritoneal implants throughout and a significant portion of omentum and on the small intestine. - CT CAP (11/13/2021): Ascites, extensive peritoneal thickening, nodularity and omental caking.  No evidence of primary malignancy in the chest, abdomen or pelvis.  Small left pleural effusion, nonspecific.  Cholelithiasis. - US pelvis (11/14/2021): Uterus is grossly unremarkable.  Ovaries not visualized. - CA125 (11/13/2021): 1269 - Pathology: Peritoneum and omental biopsy consistent with high-grade serous carcinoma.  HER2 2+ by IHC and negative by FISH. - 4 cycles of carboplatin and paclitaxel from 11/21/2021 through 01/23/2022, cycle 9 on 08/29/2022. - 10/01/2022: Diagnostic laparoscopy, exploratory laparotomy, omentectomy - Pathology: Right pelvic sidewall excision-high-grade  serous carcinoma, omental resection-high-grade serous carcinoma with treatment effect present.  Small bowel implant excision was negative for carcinoma. - Germline mutation testing negative - Foundation 1 NGS (10/01/2022): LOH-18.9%, HRD +, MS-stable, TMB-low  2. Social/family history: - Lives at home with her husband.  Worked in Engineering geologist at [employer].  Non-smoker. - Maternal grandmother with bladder cancer.  Maternal great grandmother with breast cancer.    Plan: High-grade serous carcinoma of peritoneum: - She has tolerated carboplatin, paclitaxel and bevacizumab very well.  She had 1 episode of nosebleed.  Blood pressure today is 124/72 after rechecking. - We discussed HRD positivity found on foundation 1 testing. - Proceed with cycle 2 of carboplatin, paclitaxel and bevacizumab today.  RTC 3 weeks for follow-up.  After cycle 3, we will switch her to maintenance bevacizumab with PARP inhibitor.   2.  Peripheral neuropathy: - Continue Cymbalta 60 mg daily.  She could not tolerate Lyrica as it made her feel disoriented and drunk.  3.  Diarrhea: - Continue with Imodium as needed.  She had diarrhea for a week and a half after G-CSF injection.  4.  Hypomagnesemia: - She is taking magnesium twice daily.  Magnesium is low at 1.5.  She will receive IV magnesium.  Will increase magnesium to 3 times daily.   5.  Hypokalemia: - Continue potassium 20 mEq daily.  Potassium is normal today.   6.  Subsegmental pulmonary embolism (Dx 03/06/2022): - Continue Eliquis twice daily.  No bleeding issues reported.    No orders of the defined types were placed in this encounter.     I,Katie Daubenspeck,acting as a Neurosurgeon for [name], MD.,have documented all relevant documentation on the behalf of [name], MD,as directed by  [name], MD  while in the presence of [name], MD.   I, [name] MD, have reviewed the above documentation for accuracy and  completeness, and I agree with the above.   [name], MD   6/24/20245:43 PM  CHIEF COMPLAINT:   Diagnosis: high-grade serous carcinoma of peritoneum    Cancer Staging  Primary peritoneal carcinomatosis [facility]) Staging form: Ovary, Fallopian Tube, and Primary Peritoneal Carcinoma, AJCC 8th Edition - Clinical stage from 11/17/2021: FIGO Stage IIIC (cT3c, cM0) - Unsigned    Prior Therapy: Carboplatin and paclitaxel-- x 4 cycles 11/21/2021 - 01/23/2022, x5 cycles 06/05/22 - 08/29/22   Current Therapy:  Carboplatin/paclitaxel/bevacizumab    HISTORY OF PRESENT ILLNESS:   Oncology History  Primary peritoneal carcinomatosis (HCC)  11/13/2021 Imaging   CT A/P: 1. Trace ascites throughout the abdomen and pelvis, with extensive peritoneal thickening, nodularity, and omental caking. Findings are consistent with widespread peritoneal and omental metastatic disease. 2. No evidence of primary malignancy in the chest, abdomen, or pelvis, with specific attention to the most likely etiologies of peritoneal metastatic disease including pancreatic, ovarian, and endometrial malignancy. Although there is no obvious mass, pelvic ultrasound may be helpful to more closely assess the female reproductive organs. 3. Small left pleural effusion and associated atelectasis or consolidation, nonspecific although worrisome for malignant effusion. No overt pleural thickening or nodularity. 4. Scattered areas of nonspecific infectious or inflammatory alveolar ground-glass airspace opacity throughout the lungs. 5. Cholelithiasis without evidence of acute cholecystitis. 6. Nonobstructive right nephrolithiasis. 7. Postoperative findings of same day laparoscopy including minimal pneumoperitoneum.   Aortic Atherosclerosis (ICD10-I70.0).   11/13/2021 Surgery   Diagnostic laparoscopy with biopsy Dr. Lovell Sheehan Findings: Significant bloody ascites, studding of the omentum as well as the upper abdominal wall  and diaphragm.  Nodularity noted along the small intestine.  Omental disease inhibited view of the pelvis.   11/17/2021 Initial Diagnosis   Primary peritoneal carcinomatosis (HCC)   11/21/2021 - 01/25/2022 Chemotherapy   Patient is on Treatment Plan : OVARIAN Carboplatin (AUC 6) / Paclitaxel (175) q21d x 6 cycles     11/21/2021 -  Chemotherapy   Patient is on Treatment Plan : OVARIAN Carboplatin (AUC 6) + Paclitaxel (175)  + Mvasi q21d X 6 Cycles     01/19/2022 Imaging   CT A/P: 1. Small amount of adherent thrombus or fibrin sheath around the catheter in the LEFT brachiocephalic vein. 2. Small low-attenuation area in the inter lobar fissure region associated with medial segment of LEFT hepatic lobe, new from previous imaging, may represent developing "variable fat deposition" though given history of peritoneal disease would suggest attention on subsequent imaging. 3. Diminished thickening of mesenteric reflections, decreased ascites and decreased omental infiltration since previous imaging. 4. Focal concentric narrowing of the rectosigmoid junction is nonspecific but suspicious for neoplasm potentially related to serosal involvement in the pelvis in this patient with known peritoneal disease from ovarian cancer. Underlying colonic neoplasm would be difficult to exclude as well. Colonic assessment with endoscopy or comparison with recent endoscopy performed is suggested. 5. Cholelithiasis without evidence of acute cholecystitis. 6. 4 mm RIGHT lower pole renal calculus.   Aortic Atherosclerosis (ICD10-I70.0).   03/06/2022 Imaging   CT C/A/P: Chest CT:   Scattered subsegmental pulmonary emboli in the right lung.   Abdominal CT:   1. Mild right hydroureteronephrosis from a 4 mm UVJ calculus. 2. Unchanged omental disease. 3. Cholelithiasis and other chronic findings described above.   05/24/2022 Imaging   Increased peritoneal carcinomatosis and mild ascites.   New  moderate  distal small bowel obstruction, with transition point in the central pelvis adjacent to the uterine fundus, which may be due to peritoneal carcinomatosis or adhesion.   Cholelithiasis. No radiographic evidence of cholecystitis.   Aortic Atherosclerosis (ICD10-I70.0).   08/21/2022 Imaging   CT A/P: 1. Improved peritoneal carcinomatosis and mild ascites. 2. Prominent/mildly enlarged loops of small bowel throughout the abdomen with transition point in the pelvis, not significantly changed from prior and compatible with a least low-grade obstruction. 3. Cholelithiasis without findings of acute cholecystitis.   10/01/2022 Surgery   Diagnostic laparoscopy, robotic assisted laparoscopic lysis of adhesions, exploratory laparotomy, omentectomy   Findings: On EUA, small uterus within minimal mobility. On intra-abdominal entry, omentum adherent to the anterior abdominal wall at the level of the transverse colon. Some adhesions noted between the anterior liver surface and the diaphragm on the right. Small miliary disease noted on bilateral diaphragms, small bowel, small bowel mesentery, abdominal and pelvic peritoneum. No ascites. Complete retroperitonealization of the pelvic organs. Multiple loops of small bowel were densely adherent to the uterine fundus without obvious obstruction. Terminal ileum and cecum also adherent along the right pelvic sidewall. Sigmoid colon densely adherent to a loop of ileum and to the lateral aspect of the retroperitonealized pelvis on the left. After opening the retroperitoneum bilaterally and transecting the round ligaments, the bladder flap was able to be partially developed anteriorly. This revealed dense adhesions of the sigmoid to the lateral uterus. Given prior discussions with the patient, I did not feel that proceeding with further attempt at lysis of adhesions (some mobilization of two loops of ileum adherent to the uterine fundus was performed) would be possible  without significant risk of injury to multiple loops of small bowel and to the sigmoid colon. 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 On laparotomy, 8 x 4 cm omental cake noted. No disease visualized or palpated within the lesser sac. There was no palpable disease along the liver edges or spleen. Stomach was normal in appearance. Some miliary disease palpated along the abdominal peritoneum. Numerous miliary disease noted along the small bowel and small bowel mesentery. Numerous prominent lymph nodes (1cm or less) within the stalk of the small bowel mesentery.  R1 resection based on miliary disease (in the setting of incomplete pelvic evaluation).   10/01/2022 Pathology Results   A. Pelvic sidewall, right, excision - High-grade serous carcinoma   B. Omentum, resection - High-grade serous carcinoma - Treatment effect present - See comment   C. Small bowel, implant, excision - Infarcted epiploic appendage - Negative for carcinoma  Immunohistochemical stains were performed on block B1. The tumor cells are positive for WT1, compatible with tubo-ovarian or peritoneal origin. p53 demonstrates faint positivity in rare cells, favored to be most consistent with a null pattern.    11/05/2022 Genetic Testing   Negative genetic testing on the [facility] Multi-cancer + RNA panel.  The report date is November 05, 2022.  The Multi-Cancer + RNA Panel offered by [facility] includes sequencing and/or deletion/duplication analysis of the following 70 genes:  AIP*, ALK, APC*, ATM*, AXIN2*, BAP1*, BARD1*, BLM*, BMPR1A*, BRCA1*, BRCA2*, BRIP1*, CDC73*, CDH1*, CDK4, CDKN1B*, CDKN2A, CHEK2*, CTNNA1*, DICER1*, EPCAM (del/dup only), EGFR, FH*, FLCN*, GREM1 (promoter dup only), HOXB13, KIT, LZTR1, MAX*, MBD4, MEN1*, MET, MITF, MLH1*, MSH2*, MSH3*, MSH6*, MUTYH*, NF1*, NF2*, NTHL1*, PALB2*, PDGFRA, PMS2*, POLD1*, POLE*, POT1*, PRKAR1A*, PTCH1*, PTEN*, RAD51C*, RAD51D*, RB1*, RET,  SDHA* (sequencing only), SDHAF2*, SDHB*, SDHC*, SDHD*, SMAD4*, SMARCA4*, SMARCB1*, SMARCE1*, STK11*,  SUFU*, VHQI696*, TP53*, TSC1*, TSC2*, VHL*. RNA analysis is performed for * genes.       INTERVAL HISTORY:   Nicole Santos is a 52 y.o. female presenting to clinic today for follow up of high-grade serous carcinoma of peritoneum. She was last seen by me on 10/30/22.  Of note since her last visit, the results of her genetic testing returned and were negative.  Today, she states that she is doing well overall. Her appetite level is at 90%. Her energy level is at 80%.  PAST MEDICAL HISTORY:   Past Medical History: Past Medical History:  Diagnosis Date   Acute respiratory disease due to COVID-19 virus 02/02/2019   Anxiety    Cancer (HCC)    Depression    Dyspnea    Dysrhythmia    Family history of bladder cancer    Family history of breast cancer    History of diabetes mellitus    Resolved in Sept 2023 with weight loss.   Hypertension    Ovarian cancer (HCC) 11/14/2021   Peritoneal carcinomatosis (HCC)    Pneumonia    Sleep apnea    doesn't use CPAP   Tachycardia     Surgical History: Past Surgical History:  Procedure Laterality Date   KNEE SURGERY Right    Arthroscopic   LAPAROSCOPIC ABDOMINAL EXPLORATION N/A 11/13/2021   Procedure: Exploratory laparoscopy;  Surgeon: Franky Macho, MD;  Location: [facility];  Service: General;  Laterality: N/A;   PORTACATH PLACEMENT Left 11/15/2021   Procedure: INSERTION PORT-A-CATH;  Surgeon: Franky Macho, MD;  Location: [facility];  Service: General;  Laterality: Left;   TONSILLECTOMY AND ADENOIDECTOMY      Social History: Social History   Socioeconomic History   Marital status: Married    Spouse name: Not on file   Number of children: Not on file   Years of education: Not on file   Highest education level: Not on file  Occupational History   Occupation: Unemployed  Tobacco Use   Smoking status: Never   Smokeless tobacco: Never  Vaping Use    Vaping Use: Never used  Substance and Sexual Activity   Alcohol use: No   Drug use: No   Sexual activity: Not Currently  Other Topics Concern   Not on file  Social History Narrative   Not on file   Social Determinants of Health   Financial Resource Strain: Not on file  Food Insecurity: No Food Insecurity (03/06/2022)   Hunger Vital Sign    Worried About Running Out of Food in the Last Year: Never true    Ran Out of Food in the Last Year: Never true  Transportation Needs: No Transportation Needs (03/06/2022)   PRAPARE - Administrator, Civil Service (Medical): No    Lack of Transportation (Non-Medical): No  Physical Activity: Not on file  Stress: Not on file  Social Connections: Not on file  Intimate Partner Violence: Not At Risk (03/06/2022)   Humiliation, Afraid, Rape, and Kick questionnaire    Fear of Current or Ex-Partner: No    Emotionally Abused: No    Physically Abused: No    Sexually Abused: No    Family History: Family History  Problem Relation Age of Onset   Hypertension Mother    Heart attack Paternal Uncle    Bladder Cancer Maternal Grandmother 66       non-smoker   Dementia Maternal Grandfather    Breast cancer Maternal Great-grandmother        MGF's  mother   Colon cancer Neg Hx    Ovarian cancer Neg Hx    Endometrial cancer Neg Hx    Pancreatic cancer Neg Hx    Prostate cancer Neg Hx     Current Medications:  Current Outpatient Medications:    apixaban (ELIQUIS) 5 MG TABS tablet, Take 1 tablet (5 mg total) by mouth 2 (two) times daily., Disp: 180 tablet, Rfl: 3   atorvastatin (LIPITOR) 20 MG tablet, Take 1 tablet (20 mg total) by mouth at bedtime., Disp: 90 tablet, Rfl: 3   diphenoxylate-atropine (LOMOTIL) 2.5-0.025 MG tablet, Take 2 tablets by mouth after the first watery bowel movement. May then take 1 pill by mouth after each watery bowel movement. Do not exceed 8 pills in a 24 hour period., Disp: 60 tablet, Rfl: 3   DULoxetine (CYMBALTA)  30 MG capsule, TAKE 1 CAPSULE BY MOUTH DAILY FOR 7 DAYS, THEN INCREASE TO 2 CAPSULES DAILY, Disp: 60 capsule, Rfl: 5   Multiple Vitamin (MULTIVITAMIN) capsule, Take 1 capsule by mouth daily., Disp: , Rfl:    Potassium 99 MG TABS, Take 99 mg by mouth at bedtime., Disp: , Rfl:    pregabalin (LYRICA) 100 MG capsule, Take 100 mg by mouth 2 (two) times daily., Disp: , Rfl:    prochlorperazine (COMPAZINE) 10 MG tablet, Take 1 tablet (10 mg total) by mouth every 6 (six) hours as needed for nausea or vomiting., Disp: 30 tablet, Rfl: 3   magnesium oxide (MAG-OX) 400 (240 Mg) MG tablet, Take 1 tablet (400 mg total) by mouth in the morning, at noon, and at bedtime., Disp: 90 tablet, Rfl: 2 No current facility-administered medications for this visit.  Facility-Administered Medications Ordered in Other Visits:    heparin lock flush 100 unit/mL, 500 Units, Intravenous, Once, [name], MD   sodium chloride flush (NS) 0.9 % injection 10 mL, 10 mL, Intravenous, PRN, [name], MD   sodium chloride flush (NS) 0.9 % injection 10 mL, 10 mL, Intracatheter, PRN, [name], MD, 10 mL at 11/26/22 1544   Allergies: Allergies  Allergen Reactions   Augmentin [Amoxicillin-Pot Clavulanate] Diarrhea and Nausea And Vomiting   Doxycycline     Abdominal cramps.   Topamax [Topiramate] Nausea And Vomiting    REVIEW OF SYSTEMS:   Review of Systems  Constitutional:  Negative for chills, fatigue and fever.  HENT:   Negative for lump/mass, mouth sores, nosebleeds, sore throat and trouble swallowing.   Eyes:  Negative for eye problems.  Respiratory:  Negative for cough and shortness of breath.   Cardiovascular:  Negative for chest pain, leg swelling and palpitations.  Gastrointestinal:  Positive for diarrhea. Negative for abdominal pain, constipation, nausea and vomiting.  Genitourinary:  Negative for bladder incontinence, difficulty urinating, dysuria, frequency, hematuria and nocturia.    Musculoskeletal:  Negative for arthralgias, back pain, flank pain, myalgias and neck pain.  Skin:  Negative for itching and rash.  Neurological:  Positive for numbness. Negative for dizziness and headaches.  Hematological:  Does not bruise/bleed easily.  Psychiatric/Behavioral:  Positive for sleep disturbance. Negative for depression and suicidal ideas. The patient is not nervous/anxious.   All other systems reviewed and are negative.    VITALS:   Blood pressure 124/72.  Wt Readings from Last 3 Encounters:  11/26/22 201 lb 6.4 oz (91.4 kg)  11/05/22 193 lb 6.4 oz (87.7 kg)  10/30/22 201 lb 3.2 oz (91.3 kg)    There is no height or weight on file to calculate BMI.  Performance status (ECOG): 1 - Symptomatic but completely ambulatory  PHYSICAL EXAM:   Physical Exam Vitals and nursing note reviewed. Exam conducted with a chaperone present.  Constitutional:      Appearance: Normal appearance.  Cardiovascular:     Rate and Rhythm: Normal rate and regular rhythm.     Pulses: Normal pulses.     Heart sounds: Normal heart sounds.  Pulmonary:     Effort: Pulmonary effort is normal.     Breath sounds: Normal breath sounds.  Abdominal:     Palpations: Abdomen is soft. There is no hepatomegaly, splenomegaly or mass.     Tenderness: There is no abdominal tenderness.  Musculoskeletal:     Right lower leg: No edema.     Left lower leg: No edema.  Lymphadenopathy:     Cervical: No cervical adenopathy.     Right cervical: No superficial, deep or posterior cervical adenopathy.    Left cervical: No superficial, deep or posterior cervical adenopathy.     Upper Body:     Right upper body: No supraclavicular or axillary adenopathy.     Left upper body: No supraclavicular or axillary adenopathy.  Neurological:     General: No focal deficit present.     Mental Status: She is alert and oriented to person, place, and time.  Psychiatric:        Mood and Affect: Mood normal.        Behavior:  Behavior normal.     LABS:      Latest Ref Rng & Units 11/26/2022    7:59 AM 11/05/2022    7:52 AM 08/29/2022    8:25 AM  CBC  WBC 4.0 - 10.5 K/uL 5.7  6.7  3.8   Hemoglobin 12.0 - 15.0 g/dL 56.2  13.0  9.9   Hematocrit 36.0 - 46.0 % 35.4  37.6  29.7   Platelets 150 - 400 K/uL 153  230  118       Latest Ref Rng & Units 11/26/2022    7:59 AM 11/05/2022    7:52 AM 08/29/2022    8:25 AM  CMP  Glucose 70 - 99 mg/dL 865  784  696   BUN 6 - 20 mg/dL 10  13  14    Creatinine 0.44 - 1.00 mg/dL 2.95  2.84  1.32   Sodium 135 - 145 mmol/L 136  138  136   Potassium 3.5 - 5.1 mmol/L 4.2  3.5  3.8   Chloride 98 - 111 mmol/L 103  102  105   CO2 22 - 32 mmol/L 24  26  25    Calcium 8.9 - 10.3 mg/dL 8.5  9.1  8.4   Total Protein 6.5 - 8.1 g/dL 6.9  7.1  6.5   Total Bilirubin 0.3 - 1.2 mg/dL 0.8  1.2  0.5   Alkaline Phos 38 - 126 U/L 56  55  64   AST 15 - 41 U/L 17  18  13    ALT 0 - 44 U/L 16  17  12       Lab Results  Component Value Date   CEA1 1.1 11/13/2021   /  CEA  Date Value Ref Range Status  11/13/2021 1.1 0.0 - 4.7 ng/mL Final    Comment:    (NOTE)                             Nonsmokers          <  3.9                             Smokers             <5.6 [employer] Electrochemiluminescence Immunoassay (ECLIA) Values obtained with different assay methods or kits cannot be used interchangeably.  Results cannot be interpreted as absolute evidence of the presence or absence of malignant disease. Performed At: [facility] [address] [place], [name] [id number] Jolene Schimke MD WU:[id number]    No results found for: "PSA1" Lab Results  Component Value Date   CAN199 6 11/13/2021   Lab Results  Component Value Date   CAN125 11.6 07/16/2022    No results found for: "TOTALPROTELP", "ALBUMINELP", "A1GS", "A2GS", "BETS", "BETA2SER", "GAMS", "MSPIKE", "SPEI" Lab Results  Component Value Date   FERRITIN 356 (H) 02/07/2019   FERRITIN 311 (H) 02/06/2019    FERRITIN 343 (H) 02/05/2019   Lab Results  Component Value Date   LDH 259 (H) 02/02/2019     STUDIES:   No results found.

## 2022-11-26 ENCOUNTER — Inpatient Hospital Stay (HOSPITAL_BASED_OUTPATIENT_CLINIC_OR_DEPARTMENT_OTHER): Payer: Commercial Managed Care - PPO | Admitting: Hematology

## 2022-11-26 ENCOUNTER — Inpatient Hospital Stay: Payer: Commercial Managed Care - PPO

## 2022-11-26 VITALS — BP 140/80 | HR 96 | Temp 98.5°F | Resp 18

## 2022-11-26 DIAGNOSIS — C482 Malignant neoplasm of peritoneum, unspecified: Secondary | ICD-10-CM

## 2022-11-26 DIAGNOSIS — Z5111 Encounter for antineoplastic chemotherapy: Secondary | ICD-10-CM | POA: Diagnosis not present

## 2022-11-26 LAB — CBC WITH DIFFERENTIAL/PLATELET
Abs Immature Granulocytes: 0.02 10*3/uL (ref 0.00–0.07)
Basophils Absolute: 0 10*3/uL (ref 0.0–0.1)
Basophils Relative: 0 %
Eosinophils Absolute: 0 10*3/uL (ref 0.0–0.5)
Eosinophils Relative: 1 %
HCT: 35.4 % — ABNORMAL LOW (ref 36.0–46.0)
Hemoglobin: 11.5 g/dL — ABNORMAL LOW (ref 12.0–15.0)
Immature Granulocytes: 0 %
Lymphocytes Relative: 21 %
Lymphs Abs: 1.2 10*3/uL (ref 0.7–4.0)
MCH: 32.9 pg (ref 26.0–34.0)
MCHC: 32.5 g/dL (ref 30.0–36.0)
MCV: 101.1 fL — ABNORMAL HIGH (ref 80.0–100.0)
Monocytes Absolute: 0.5 10*3/uL (ref 0.1–1.0)
Monocytes Relative: 8 %
Neutro Abs: 4 10*3/uL (ref 1.7–7.7)
Neutrophils Relative %: 70 %
Platelets: 153 10*3/uL (ref 150–400)
RBC: 3.5 MIL/uL — ABNORMAL LOW (ref 3.87–5.11)
RDW: 13.7 % (ref 11.5–15.5)
WBC: 5.7 10*3/uL (ref 4.0–10.5)
nRBC: 0 % (ref 0.0–0.2)

## 2022-11-26 LAB — COMPREHENSIVE METABOLIC PANEL
ALT: 16 U/L (ref 0–44)
AST: 17 U/L (ref 15–41)
Albumin: 3.4 g/dL — ABNORMAL LOW (ref 3.5–5.0)
Alkaline Phosphatase: 56 U/L (ref 38–126)
Anion gap: 9 (ref 5–15)
BUN: 10 mg/dL (ref 6–20)
CO2: 24 mmol/L (ref 22–32)
Calcium: 8.5 mg/dL — ABNORMAL LOW (ref 8.9–10.3)
Chloride: 103 mmol/L (ref 98–111)
Creatinine, Ser: 0.59 mg/dL (ref 0.44–1.00)
GFR, Estimated: >60 mL/min (ref >60)
Glucose, Bld: 151 mg/dL — ABNORMAL HIGH (ref 70–99)
Potassium: 4.2 mmol/L (ref 3.5–5.1)
Sodium: 136 mmol/L (ref 135–145)
Total Bilirubin: 0.8 mg/dL (ref 0.3–1.2)
Total Protein: 6.9 g/dL (ref 6.5–8.1)

## 2022-11-26 LAB — MAGNESIUM: Magnesium: 1.5 mg/dL — ABNORMAL LOW (ref 1.7–2.4)

## 2022-11-26 MED ORDER — SODIUM CHLORIDE 0.9 % IV SOLN
140.0000 mg/m2 | Freq: Once | INTRAVENOUS | Status: AC
  Administered 2022-11-26: 276 mg via INTRAVENOUS
  Filled 2022-11-26: qty 46

## 2022-11-26 MED ORDER — MAGNESIUM OXIDE -MG SUPPLEMENT 400 (240 MG) MG PO TABS: 1.0000 | ORAL_TABLET | Freq: Three times a day (TID) | ORAL | 2 refills | Status: DC

## 2022-11-26 MED ORDER — MAGNESIUM SULFATE 2 GM/50ML IV SOLN
2.0000 g | Freq: Once | INTRAVENOUS | Status: AC
  Administered 2022-11-26: 2 g via INTRAVENOUS
  Filled 2022-11-26: qty 50

## 2022-11-26 MED ORDER — SODIUM CHLORIDE 0.9 % IV SOLN
720.0000 mg | Freq: Once | INTRAVENOUS | Status: AC
  Administered 2022-11-26: 720 mg via INTRAVENOUS
  Filled 2022-11-26: qty 72

## 2022-11-26 MED ORDER — CETIRIZINE HCL 10 MG/ML IV SOLN
10.0000 mg | Freq: Once | INTRAVENOUS | Status: AC
  Administered 2022-11-26: 10 mg via INTRAVENOUS
  Filled 2022-11-26: qty 1

## 2022-11-26 MED ORDER — SODIUM CHLORIDE 0.9 % IV SOLN
10.0000 mg | Freq: Once | INTRAVENOUS | Status: AC
  Administered 2022-11-26: 10 mg via INTRAVENOUS
  Filled 2022-11-26: qty 10

## 2022-11-26 MED ORDER — HEPARIN SOD (PORK) LOCK FLUSH 100 UNIT/ML IV SOLN
500.0000 [IU] | Freq: Once | INTRAVENOUS | Status: AC | PRN
  Administered 2022-11-26: 500 [IU]

## 2022-11-26 MED ORDER — FAMOTIDINE IN NACL 20-0.9 MG/50ML-% IV SOLN
20.0000 mg | Freq: Once | INTRAVENOUS | Status: AC
  Administered 2022-11-26: 20 mg via INTRAVENOUS
  Filled 2022-11-26: qty 50

## 2022-11-26 MED ORDER — SODIUM CHLORIDE 0.9 % IV SOLN
15.0000 mg/kg | Freq: Once | INTRAVENOUS | Status: AC
  Administered 2022-11-26: 1300 mg via INTRAVENOUS
  Filled 2022-11-26: qty 48

## 2022-11-26 MED ORDER — SODIUM CHLORIDE 0.9% FLUSH
10.0000 mL | INTRAVENOUS | Status: DC | PRN
  Administered 2022-11-26: 10 mL

## 2022-11-26 MED ORDER — SODIUM CHLORIDE 0.9 % IV SOLN: Freq: Once | INTRAVENOUS | Status: AC

## 2022-11-26 MED ORDER — SODIUM CHLORIDE 0.9 % IV SOLN
150.0000 mg | Freq: Once | INTRAVENOUS | Status: AC
  Administered 2022-11-26: 150 mg via INTRAVENOUS
  Filled 2022-11-26: qty 150

## 2022-11-26 MED ORDER — PALONOSETRON HCL INJECTION 0.25 MG/5ML
0.2500 mg | Freq: Once | INTRAVENOUS | Status: AC
  Administered 2022-11-26: 0.25 mg via INTRAVENOUS
  Filled 2022-11-26: qty 5

## 2022-11-26 NOTE — Progress Notes (Signed)
Patient presents today for MVASI/Paclitaxel/Carboplatin infusion. Patient is in satisfactory condition with no new complaints voiced.  Vital signs are stable.  Labs reviewed by Dr. Ellin Saba during the office visit and all labs are within treatment parameters. Patient's magnesium is 1.5 today, pt will receive 2g IV magnesium sulfate per Dr.K's standing orders. We will proceed with treatment per MD orders.   Treatment given today per MD orders. Tolerated infusion without adverse affects. Vital signs stable. No complaints at this time. Discharged from clinic ambulatory in stable condition. Alert and oriented x 3. F/U with [facility] as scheduled.

## 2022-11-26 NOTE — Patient Instructions (Addendum)
[facility] at [facility] Discharge Instructions   You were seen and examined today by Dr. Ellin Saba.  He reviewed the results of your lab work which are mostly normal/stable. Your magnesium is low today at 1.5. We will give you IV magnesium in the clinic today. Increase the magnesium you take at home to 3 times a day.  We will proceed with your treatment today.    Return as scheduled.    Thank you for choosing [facility] at [facility] to provide your oncology and hematology care.  To afford each patient quality time with our provider, please arrive at least 15 minutes before your scheduled appointment time.   If you have a lab appointment with the Cancer Center please come in thru the Main Entrance and check in at the main information desk.  You need to re-schedule your appointment should you arrive 10 or more minutes late.  We strive to give you quality time with our providers, and arriving late affects you and other patients whose appointments are after yours.  Also, if you no show three or more times for appointments you may be dismissed from the clinic at the providers discretion.     Again, thank you for choosing [facility].  Our hope is that these requests will decrease the amount of time that you wait before being seen by our physicians.       _____________________________________________________________  Should you have questions after your visit to [facility], please contact our office at [phone] and follow the prompts.  Our office hours are 8:00 a.m. and 4:30 p.m. Monday - Friday.  Please note that voicemails left after 4:00 p.m. may not be returned until the following business day.  We are closed weekends and major holidays.  You do have access to a nurse 24-7, just call the main number to the clinic [phone] and do not press any options, hold on the line and a nurse will answer the phone.     For prescription refill requests, have your pharmacy contact our office and allow 72 hours.    Due to Covid, you will need to wear a mask upon entering the hospital. If you do not have a mask, a mask will be given to you at the Main Entrance upon arrival. For doctor visits, patients may have 1 support person age 36 or older with them. For treatment visits, patients can not have anyone with them due to social distancing guidelines and our immunocompromised population.

## 2022-11-26 NOTE — Progress Notes (Signed)
Patient has been examined by Dr. Ellin Saba. Vital signs and labs have been reviewed by MD - ANC, Creatinine, LFTs, hemoglobin, and platelets are within treatment parameters per M.D. - pt may proceed with treatment. Magnesium IV per standing orders. Primary RN and pharmacy notified.

## 2022-11-26 NOTE — Patient Instructions (Signed)
[facility] AT [facility]  Discharge Instructions: Thank you for choosing [facility] to provide your oncology and hematology care.  If you have a lab appointment with the Cancer Center - please note that after April 8th, 2024, all labs will be drawn in the cancer center.  You do not have to check in or register with the main entrance as you have in the past but will complete your check-in in the cancer center.  Wear comfortable clothing and clothing appropriate for easy access to any Portacath or PICC line.   We strive to give you quality time with your provider. You may need to reschedule your appointment if you arrive late (15 or more minutes).  Arriving late affects you and other patients whose appointments are after yours.  Also, if you miss three or more appointments without notifying the office, you may be dismissed from the clinic at the provider's discretion.      For prescription refill requests, have your pharmacy contact our office and allow 72 hours for refills to be completed.    Today you received the following chemotherapy and/or immunotherapy agents MVASI/Taxol/Carboplatin   To help prevent nausea and vomiting after your treatment, we encourage you to take your nausea medication as directed.  Bevacizumab Injection What is this medication? BEVACIZUMAB (be va SIZ yoo mab) treats some types of cancer. It works by blocking a protein that causes cancer cells to grow and multiply. This helps to slow or stop the spread of cancer cells. It is a monoclonal antibody. This medicine may be used for other purposes; ask your health care provider or pharmacist if you have questions. COMMON BRAND NAME(S): Alymsys, Avastin, MVASI, Omer Jack What should I tell my care team before I take this medication? They need to know if you have any of these conditions: Blood clots Coughing up blood Having or recent surgery Heart failure High blood pressure History of a connection  between 2 or more body parts that do not usually connect (fistula) History of a tear in your stomach or intestines Protein in your urine An unusual or allergic reaction to bevacizumab, other medications, foods, dyes, or preservatives Pregnant or trying to get pregnant Breast-feeding How should I use this medication? This medication is injected into a vein. It is given by your care team in a hospital or clinic setting. Talk to your care team the use of this medication in children. Special care may be needed. Overdosage: If you think you have taken too much of this medicine contact a poison control center or emergency room at once. NOTE: This medicine is only for you. Do not share this medicine with others. What if I miss a dose? Keep appointments for follow-up doses. It is important not to miss your dose. Call your care team if you are unable to keep an appointment. What may interact with this medication? Interactions are not expected. This list may not describe all possible interactions. Give your health care provider a list of all the medicines, herbs, non-prescription drugs, or dietary supplements you use. Also tell them if you smoke, drink alcohol, or use illegal drugs. Some items may interact with your medicine. What should I watch for while using this medication? Your condition will be monitored carefully while you are receiving this medication. You may need blood work while taking this medication. This medication may make you feel generally unwell. This is not uncommon as chemotherapy can affect healthy cells as well as cancer cells. Report any  side effects. Continue your course of treatment even though you feel ill unless your care team tells you to stop. This medication may increase your risk to bruise or bleed. Call your care team if you notice any unusual bleeding. Before having surgery, talk to your care team to make sure it is ok. This medication can increase the risk of poor healing  of your surgical site or wound. You will need to stop this medication for 28 days before surgery. After surgery, wait at least 28 days before restarting this medication. Make sure the surgical site or wound is healed enough before restarting this medication. Talk to your care team if questions. Talk to your care team if you may be pregnant. Serious birth defects can occur if you take this medication during pregnancy and for 6 months after the last dose. Contraception is recommended while taking this medication and for 6 months after the last dose. Your care team can help you find the option that works for you. Do not breastfeed while taking this medication and for 6 months after the last dose. This medication can cause infertility. Talk to your care team if you are concerned about your fertility. What side effects may I notice from receiving this medication? Side effects that you should report to your care team as soon as possible: Allergic reactions--skin rash, itching, hives, swelling of the face, lips, tongue, or throat Bleeding--bloody or black, tar-like stools, vomiting blood or brown material that looks like coffee grounds, red or dark brown urine, small red or purple spots on skin, unusual bruising or bleeding Blood clot--pain, swelling, or warmth in the leg, shortness of breath, chest pain Heart attack--pain or tightness in the chest, shoulders, arms, or jaw, nausea, shortness of breath, cold or clammy skin, feeling faint or lightheaded Heart failure--shortness of breath, swelling of the ankles, feet, or hands, sudden weight gain, unusual weakness or fatigue Increase in blood pressure Infection--fever, chills, cough, sore throat, wounds that don't heal, pain or trouble when passing urine, general feeling of discomfort or being unwell Infusion reactions--chest pain, shortness of breath or trouble breathing, feeling faint or lightheaded Kidney injury--decrease in the amount of urine, swelling of  the ankles, hands, or feet Stomach pain that is severe, does not go away, or gets worse Stroke--sudden numbness or weakness of the face, arm, or leg, trouble speaking, confusion, trouble walking, loss of balance or coordination, dizziness, severe headache, change in vision Sudden and severe headache, confusion, change in vision, seizures, which may be signs of posterior reversible encephalopathy syndrome (PRES) Side effects that usually do not require medical attention (report to your care team if they continue or are bothersome): Back pain Change in taste Diarrhea Dry skin Increased tears Nosebleed This list may not describe all possible side effects. Call your doctor for medical advice about side effects. You may report side effects to FDA at 1-800-FDA-1088. Where should I keep my medication? This medication is given in a hospital or clinic. It will not be stored at home. NOTE: This sheet is a summary. It may not cover all possible information. If you have questions about this medicine, talk to your doctor, pharmacist, or health care provider.  2024 Elsevier/Gold Standard (2021-10-06 00:00:00)  Paclitaxel Injection What is this medication? PACLITAXEL (PAK li TAX el) treats some types of cancer. It works by slowing down the growth of cancer cells. This medicine may be used for other purposes; ask your health care provider or pharmacist if you have questions. COMMON BRAND  NAME(S): Onxol, Taxol What should I tell my care team before I take this medication? They need to know if you have any of these conditions: Heart disease Liver disease Low white blood cell levels An unusual or allergic reaction to paclitaxel, other medications, foods, dyes, or preservatives If you or your partner are pregnant or trying to get pregnant Breast-feeding How should I use this medication? This medication is injected into a vein. It is given by your care team in a hospital or clinic setting. Talk to your  care team about the use of this medication in children. While it may be given to children for selected conditions, precautions do apply. Overdosage: If you think you have taken too much of this medicine contact a poison control center or emergency room at once. NOTE: This medicine is only for you. Do not share this medicine with others. What if I miss a dose? Keep appointments for follow-up doses. It is important not to miss your dose. Call your care team if you are unable to keep an appointment. What may interact with this medication? Do not take this medication with any of the following: Live virus vaccines Other medications may affect the way this medication works. Talk with your care team about all of the medications you take. They may suggest changes to your treatment plan to lower the risk of side effects and to make sure your medications work as intended. This list may not describe all possible interactions. Give your health care provider a list of all the medicines, herbs, non-prescription drugs, or dietary supplements you use. Also tell them if you smoke, drink alcohol, or use illegal drugs. Some items may interact with your medicine. What should I watch for while using this medication? Your condition will be monitored carefully while you are receiving this medication. You may need blood work while taking this medication. This medication may make you feel generally unwell. This is not uncommon as chemotherapy can affect healthy cells as well as cancer cells. Report any side effects. Continue your course of treatment even though you feel ill unless your care team tells you to stop. This medication can cause serious allergic reactions. To reduce the risk, your care team may give you other medications to take before receiving this one. Be sure to follow the directions from your care team. This medication may increase your risk of getting an infection. Call your care team for advice if you get a  fever, chills, sore throat, or other symptoms of a cold or flu. Do not treat yourself. Try to avoid being around people who are sick. This medication may increase your risk to bruise or bleed. Call your care team if you notice any unusual bleeding. Be careful brushing or flossing your teeth or using a toothpick because you may get an infection or bleed more easily. If you have any dental work done, tell your dentist you are receiving this medication. Talk to your care team if you may be pregnant. Serious birth defects can occur if you take this medication during pregnancy. Talk to your care team before breastfeeding. Changes to your treatment plan may be needed. What side effects may I notice from receiving this medication? Side effects that you should report to your care team as soon as possible: Allergic reactions--skin rash, itching, hives, swelling of the face, lips, tongue, or throat Heart rhythm changes--fast or irregular heartbeat, dizziness, feeling faint or lightheaded, chest pain, trouble breathing Increase in blood pressure Infection--fever, chills, cough, sore  throat, wounds that don't heal, pain or trouble when passing urine, general feeling of discomfort or being unwell Low blood pressure--dizziness, feeling faint or lightheaded, blurry vision Low red blood cell level--unusual weakness or fatigue, dizziness, headache, trouble breathing Painful swelling, warmth, or redness of the skin, blisters or sores at the infusion site Pain, tingling, or numbness in the hands or feet Slow heartbeat--dizziness, feeling faint or lightheaded, confusion, trouble breathing, unusual weakness or fatigue Unusual bruising or bleeding Side effects that usually do not require medical attention (report to your care team if they continue or are bothersome): Diarrhea Hair loss Joint pain Loss of appetite Muscle pain Nausea Vomiting This list may not describe all possible side effects. Call your doctor  for medical advice about side effects. You may report side effects to FDA at 1-800-FDA-1088. Where should I keep my medication? This medication is given in a hospital or clinic. It will not be stored at home. NOTE: This sheet is a summary. It may not cover all possible information. If you have questions about this medicine, talk to your doctor, pharmacist, or health care provider.  2024 Elsevier/Gold Standard (2021-10-10 00:00:00)  Carboplatin Injection What is this medication? CARBOPLATIN (KAR boe pla tin) treats some types of cancer. It works by slowing down the growth of cancer cells. This medicine may be used for other purposes; ask your health care provider or pharmacist if you have questions. COMMON BRAND NAME(S): Paraplatin What should I tell my care team before I take this medication? They need to know if you have any of these conditions: Blood disorders Hearing problems Kidney disease Recent or ongoing radiation therapy An unusual or allergic reaction to carboplatin, cisplatin, other medications, foods, dyes, or preservatives Pregnant or trying to get pregnant Breast-feeding How should I use this medication? This medication is injected into a vein. It is given by your care team in a hospital or clinic setting. Talk to your care team about the use of this medication in children. Special care may be needed. Overdosage: If you think you have taken too much of this medicine contact a poison control center or emergency room at once. NOTE: This medicine is only for you. Do not share this medicine with others. What if I miss a dose? Keep appointments for follow-up doses. It is important not to miss your dose. Call your care team if you are unable to keep an appointment. What may interact with this medication? Medications for seizures Some antibiotics, such as amikacin, gentamicin, neomycin, streptomycin, tobramycin Vaccines This list may not describe all possible interactions. Give  your health care provider a list of all the medicines, herbs, non-prescription drugs, or dietary supplements you use. Also tell them if you smoke, drink alcohol, or use illegal drugs. Some items may interact with your medicine. What should I watch for while using this medication? Your condition will be monitored carefully while you are receiving this medication. You may need blood work while taking this medication. This medication may make you feel generally unwell. This is not uncommon, as chemotherapy can affect healthy cells as well as cancer cells. Report any side effects. Continue your course of treatment even though you feel ill unless your care team tells you to stop. In some cases, you may be given additional medications to help with side effects. Follow all directions for their use. This medication may increase your risk of getting an infection. Call your care team for advice if you get a fever, chills, sore throat, or other  symptoms of a cold or flu. Do not treat yourself. Try to avoid being around people who are sick. Avoid taking medications that contain aspirin, acetaminophen, ibuprofen, naproxen, or ketoprofen unless instructed by your care team. These medications may hide a fever. Be careful brushing or flossing your teeth or using a toothpick because you may get an infection or bleed more easily. If you have any dental work done, tell your dentist you are receiving this medication. Talk to your care team if you wish to become pregnant or think you might be pregnant. This medication can cause serious birth defects. Talk to your care team about effective forms of contraception. Do not breast-feed while taking this medication. What side effects may I notice from receiving this medication? Side effects that you should report to your care team as soon as possible: Allergic reactions--skin rash, itching, hives, swelling of the face, lips, tongue, or throat Infection--fever, chills, cough, sore  throat, wounds that don't heal, pain or trouble when passing urine, general feeling of discomfort or being unwell Low red blood cell level--unusual weakness or fatigue, dizziness, headache, trouble breathing Pain, tingling, or numbness in the hands or feet, muscle weakness, change in vision, confusion or trouble speaking, loss of balance or coordination, trouble walking, seizures Unusual bruising or bleeding Side effects that usually do not require medical attention (report to your care team if they continue or are bothersome): Hair loss Nausea Unusual weakness or fatigue Vomiting This list may not describe all possible side effects. Call your doctor for medical advice about side effects. You may report side effects to FDA at 1-800-FDA-1088. Where should I keep my medication? This medication is given in a hospital or clinic. It will not be stored at home. NOTE: This sheet is a summary. It may not cover all possible information. If you have questions about this medicine, talk to your doctor, pharmacist, or health care provider.  2024 Elsevier/Gold Standard (2021-09-12 00:00:00)     BELOW ARE SYMPTOMS THAT SHOULD BE REPORTED IMMEDIATELY: *FEVER GREATER THAN 100.4 F (38 C) OR HIGHER *CHILLS OR SWEATING *NAUSEA AND VOMITING THAT IS NOT CONTROLLED WITH YOUR NAUSEA MEDICATION *UNUSUAL SHORTNESS OF BREATH *UNUSUAL BRUISING OR BLEEDING *URINARY PROBLEMS (pain or burning when urinating, or frequent urination) *BOWEL PROBLEMS (unusual diarrhea, constipation, pain near the anus) TENDERNESS IN MOUTH AND THROAT WITH OR WITHOUT PRESENCE OF ULCERS (sore throat, sores in mouth, or a toothache) UNUSUAL RASH, SWELLING OR PAIN  UNUSUAL VAGINAL DISCHARGE OR ITCHING   Items with * indicate a potential emergency and should be followed up as soon as possible or go to the Emergency Department if any problems should occur.  Please show the CHEMOTHERAPY ALERT CARD or IMMUNOTHERAPY ALERT CARD at check-in to  the Emergency Department and triage nurse.  Should you have questions after your visit or need to cancel or reschedule your appointment, please contact [facility] [phone]  and follow the prompts.  Office hours are 8:00 a.m. to 4:30 p.m. Monday - Friday. Please note that voicemails left after 4:00 p.m. may not be returned until the following business day.  We are closed weekends and major holidays. You have access to a nurse at all times for urgent questions. Please call the main number to the clinic [phone] and follow the prompts.  For any non-urgent questions, you may also contact your provider using MyChart. We now offer e-Visits for anyone 62 and older to request care online for non-urgent symptoms. For details visit [email or web address].  Also download the MyChart app! Go to the app store, search "MyChart", open the app, select [place], and log in with your MyChart username and password.

## 2022-11-27 LAB — CA 125: Cancer Antigen (CA) 125: 11.2 U/mL (ref 0.0–38.1)

## 2022-11-28 ENCOUNTER — Inpatient Hospital Stay: Payer: Commercial Managed Care - PPO

## 2022-11-28 VITALS — BP 120/76 | HR 74 | Temp 97.0°F | Resp 18

## 2022-11-28 DIAGNOSIS — C482 Malignant neoplasm of peritoneum, unspecified: Secondary | ICD-10-CM

## 2022-11-28 DIAGNOSIS — Z5111 Encounter for antineoplastic chemotherapy: Secondary | ICD-10-CM | POA: Diagnosis not present

## 2022-11-28 MED ORDER — PEGFILGRASTIM INJECTION 6 MG/0.6ML ~~LOC~~
6.0000 mg | PREFILLED_SYRINGE | Freq: Once | SUBCUTANEOUS | Status: AC
  Administered 2022-11-28: 6 mg via SUBCUTANEOUS
  Filled 2022-11-28: qty 0.6

## 2022-11-28 NOTE — Progress Notes (Signed)
Patient tolerated injection with no complaints voiced. Site clean and dry with no bruising or swelling noted at site. See MAR for details. Band aid applied.  Patient stable during and after injection. VSS with discharge and left in satisfactory condition with no s/s of distress noted.  

## 2022-11-28 NOTE — Patient Instructions (Signed)
[facility]  Discharge Instructions: Thank you for choosing [facility] to provide your oncology and hematology care.  If you have a lab appointment with the Cancer Center - please note that after April 8th, 2024, all labs will be drawn in the cancer center.  You do not have to check in or register with the main entrance as you have in the past but will complete your check-in in the cancer center.  Wear comfortable clothing and clothing appropriate for easy access to any Portacath or PICC line.   We strive to give you quality time with your provider. You may need to reschedule your appointment if you arrive late (15 or more minutes).  Arriving late affects you and other patients whose appointments are after yours.  Also, if you miss three or more appointments without notifying the office, you may be dismissed from the clinic at the provider's discretion.      For prescription refill requests, have your pharmacy contact our office and allow 72 hours for refills to be completed.    Today you received the following pegfilgrastim return as scheduled.   To help prevent nausea and vomiting after your treatment, we encourage you to take your nausea medication as directed.  BELOW ARE SYMPTOMS THAT SHOULD BE REPORTED IMMEDIATELY: *FEVER GREATER THAN 100.4 F (38 C) OR HIGHER *CHILLS OR SWEATING *NAUSEA AND VOMITING THAT IS NOT CONTROLLED WITH YOUR NAUSEA MEDICATION *UNUSUAL SHORTNESS OF BREATH *UNUSUAL BRUISING OR BLEEDING *URINARY PROBLEMS (pain or burning when urinating, or frequent urination) *BOWEL PROBLEMS (unusual diarrhea, constipation, pain near the anus) TENDERNESS IN MOUTH AND THROAT WITH OR WITHOUT PRESENCE OF ULCERS (sore throat, sores in mouth, or a toothache) UNUSUAL RASH, SWELLING OR PAIN  UNUSUAL VAGINAL DISCHARGE OR ITCHING   Items with * indicate a potential emergency and should be followed up as soon as possible or go to the Emergency Department  if any problems should occur.  Please show the CHEMOTHERAPY ALERT CARD or IMMUNOTHERAPY ALERT CARD at check-in to the Emergency Department and triage nurse.  Should you have questions after your visit or need to cancel or reschedule your appointment, please contact [facility] [phone]  and follow the prompts.  Office hours are 8:00 a.m. to 4:30 p.m. Monday - Friday. Please note that voicemails left after 4:00 p.m. may not be returned until the following business day.  We are closed weekends and major holidays. You have access to a nurse at all times for urgent questions. Please call the main number to the clinic [phone] and follow the prompts.  For any non-urgent questions, you may also contact your provider using MyChart. We now offer e-Visits for anyone 18 and older to request care online for non-urgent symptoms. For details visit [email or web address].   Also download the MyChart app! Go to the app store, search "MyChart", open the app, select [place], and log in with your MyChart username and password.   

## 2022-11-29 ENCOUNTER — Encounter: Payer: Commercial Managed Care - PPO | Admitting: Gynecologic Oncology

## 2022-12-13 ENCOUNTER — Other Ambulatory Visit: Payer: Self-pay

## 2022-12-13 DIAGNOSIS — C482 Malignant neoplasm of peritoneum, unspecified: Secondary | ICD-10-CM

## 2022-12-16 NOTE — Progress Notes (Signed)
[facility] [address] [name] [address]    Clinic Day:  12/17/22   Referring physician: [name], DO  Patient Care Team: [name], DO as PCP - General (Family Medicine) [name], MD as Referring Physician (Optometry) [name], MD as Medical Oncologist (Medical Oncology) [name], RN as Oncology Nurse Navigator (Medical Oncology)   ASSESSMENT & PLAN:   Assessment: 1. High-grade serous carcinoma of peritoneum: - She had developed sharp left-sided abdominal pain since Mother's Day, occasional radiating to the back.  40 pound weight loss in the last 6 months. - Personal history of cervical cancer in 2000, status post chemo plus XRT at [facility].  She has slightly decreased hearing in the left ear since then. - US abdomen (11/08/2021): Cholelithiasis with gallbladder wall thickening and sonographic Murphy sign.  Upper abdominal ascites. - Laparoscopy (11/13/2021): Starting of the omentum as well as the upper abdominal wall and diaphragm.  Liver was within normal limits.  6 L of ascites was evacuated.  Omental and peritoneal biopsies were sent.  Peritoneal implants throughout and a significant portion of omentum and on the small intestine. - CT CAP (11/13/2021): Ascites, extensive peritoneal thickening, nodularity and omental caking.  No evidence of primary malignancy in the chest, abdomen or pelvis.  Small left pleural effusion, nonspecific.  Cholelithiasis. - US pelvis (11/14/2021): Uterus is grossly unremarkable.  Ovaries not visualized. - CA125 (11/13/2021): 1269 - Pathology: Peritoneum and omental biopsy consistent with high-grade serous carcinoma.  HER2 2+ by IHC and negative by FISH. - 4 cycles of carboplatin and paclitaxel from 11/21/2021 through 01/23/2022, cycle 9 on 08/29/2022. - 10/01/2022: Diagnostic laparoscopy, exploratory laparotomy, omentectomy - Pathology: Right pelvic sidewall excision-high-grade  serous carcinoma, omental resection-high-grade serous carcinoma with treatment effect present.  Small bowel implant excision was negative for carcinoma. - Germline mutation testing negative - Foundation 1 NGS (10/01/2022): LOH-18.9%, HRD +, MS-stable, TMB-low  2. Social/family history: - Lives at home with her husband.  Worked in Engineering geologist at [employer].  Non-smoker. - Maternal grandmother with bladder cancer.  Maternal great grandmother with breast cancer.    Plan: High-grade serous carcinoma of peritoneum: - She has tolerated last cycle reasonably well except diarrhea lasting about 1 week after G-CSF injection. - Reviewed labs today: Normal LFTs and creatinine.  CBC grossly normal.  CA125 was 11.2. - UA today is negative for protein. - Recommend proceeding with cycle 3 today.  This will be her last treatment. - As her HRD was positive, we discussed maintenance therapy with the combination of olaparib 300 mg twice daily and bevacizumab every 3 weeks. - We discussed side effects of olaparib in detail. - We have sent prescription to her pharmacy.  She will hold onto the pills.  She will start with next cycle of bevacizumab.  RTC 3 weeks for follow-up.   2.  Peripheral neuropathy: - Continue Cymbalta 60 mg daily.  Numbness has spread from the toes to the bottom of the feet.  Will closely monitor.  3.  Diarrhea: - Continue Lomotil as needed.  She has reported diarrhea for 1 week after G-CSF injection.  I have discontinued G-CSF injection after this treatment.  4.  Hypomagnesemia: - Continue magnesium 3 times daily.  Magnesium is normal today.   5.  Hypokalemia: - Continue potassium 20 mill colons daily.  Potassium is normal.    6.  Subsegmental pulmonary embolism (Dx 03/06/2022): - Continue Eliquis twice daily.  No bleeding  issues.    No orders of the defined types were placed in this encounter.      Alben Deeds Teague,acting as a Neurosurgeon for [name], MD.,have documented all  relevant documentation on the behalf of [name], MD,as directed by  [name], MD while in the presence of [name], MD.  I, [name] MD, have reviewed the above documentation for accuracy and completeness, and I agree with the above.    Rexford Maus   7/14/20249:23 PM  CHIEF COMPLAINT:   Diagnosis: high-grade serous carcinoma of peritoneum    Cancer Staging  Primary peritoneal carcinomatosis [facility]) Staging form: Ovary, Fallopian Tube, and Primary Peritoneal Carcinoma, AJCC 8th Edition - Clinical stage from 11/17/2021: FIGO Stage IIIC (cT3c, cM0) - Unsigned    Prior Therapy: Carboplatin and paclitaxel-- x 4 cycles 11/21/2021 - 01/23/2022, x5 cycles 06/05/22 - 08/29/22   Current Therapy:  Carboplatin/paclitaxel/bevacizumab    HISTORY OF PRESENT ILLNESS:   Oncology History  Primary peritoneal carcinomatosis (HCC)  11/13/2021 Imaging   CT A/P: 1. Trace ascites throughout the abdomen and pelvis, with extensive peritoneal thickening, nodularity, and omental caking. Findings are consistent with widespread peritoneal and omental metastatic disease. 2. No evidence of primary malignancy in the chest, abdomen, or pelvis, with specific attention to the most likely etiologies of peritoneal metastatic disease including pancreatic, ovarian, and endometrial malignancy. Although there is no obvious mass, pelvic ultrasound may be helpful to more closely assess the female reproductive organs. 3. Small left pleural effusion and associated atelectasis or consolidation, nonspecific although worrisome for malignant effusion. No overt pleural thickening or nodularity. 4. Scattered areas of nonspecific infectious or inflammatory alveolar ground-glass airspace opacity throughout the lungs. 5. Cholelithiasis without evidence of acute cholecystitis. 6. Nonobstructive right nephrolithiasis. 7. Postoperative findings of same day laparoscopy including  minimal pneumoperitoneum.   Aortic Atherosclerosis (ICD10-I70.0).   11/13/2021 Surgery   Diagnostic laparoscopy with biopsy Dr. Lovell Sheehan Findings: Significant bloody ascites, studding of the omentum as well as the upper abdominal wall and diaphragm.  Nodularity noted along the small intestine.  Omental disease inhibited view of the pelvis.   11/17/2021 Initial Diagnosis   Primary peritoneal carcinomatosis (HCC)   11/21/2021 - 01/25/2022 Chemotherapy   Patient is on Treatment Plan : OVARIAN Carboplatin (AUC 6) / Paclitaxel (175) q21d x 6 cycles     11/21/2021 -  Chemotherapy   Patient is on Treatment Plan : OVARIAN Carboplatin (AUC 6) + Paclitaxel (175)  + Mvasi q21d X 6 Cycles     01/19/2022 Imaging   CT A/P: 1. Small amount of adherent thrombus or fibrin sheath around the catheter in the LEFT brachiocephalic vein. 2. Small low-attenuation area in the inter lobar fissure region associated with medial segment of LEFT hepatic lobe, new from previous imaging, may represent developing "variable fat deposition" though given history of peritoneal disease would suggest attention on subsequent imaging. 3. Diminished thickening of mesenteric reflections, decreased ascites and decreased omental infiltration since previous imaging. 4. Focal concentric narrowing of the rectosigmoid junction is nonspecific but suspicious for neoplasm potentially related to serosal involvement in the pelvis in this patient with known peritoneal disease from ovarian cancer. Underlying colonic neoplasm would be difficult to exclude as well. Colonic assessment with endoscopy or comparison with recent endoscopy performed is suggested. 5. Cholelithiasis without evidence of acute cholecystitis. 6. 4 mm RIGHT lower pole renal calculus.   Aortic Atherosclerosis (ICD10-I70.0).   03/06/2022 Imaging   CT C/A/P: Chest CT:   Scattered subsegmental pulmonary emboli  in the right lung.   Abdominal CT:   1. Mild right  hydroureteronephrosis from a 4 mm UVJ calculus. 2. Unchanged omental disease. 3. Cholelithiasis and other chronic findings described above.   05/24/2022 Imaging   Increased peritoneal carcinomatosis and mild ascites.   New moderate distal small bowel obstruction, with transition point in the central pelvis adjacent to the uterine fundus, which may be due to peritoneal carcinomatosis or adhesion.   Cholelithiasis. No radiographic evidence of cholecystitis.   Aortic Atherosclerosis (ICD10-I70.0).   08/21/2022 Imaging   CT A/P: 1. Improved peritoneal carcinomatosis and mild ascites. 2. Prominent/mildly enlarged loops of small bowel throughout the abdomen with transition point in the pelvis, not significantly changed from prior and compatible with a least low-grade obstruction. 3. Cholelithiasis without findings of acute cholecystitis.   10/01/2022 Surgery   Diagnostic laparoscopy, robotic assisted laparoscopic lysis of adhesions, exploratory laparotomy, omentectomy   Findings: On EUA, small uterus within minimal mobility. On intra-abdominal entry, omentum adherent to the anterior abdominal wall at the level of the transverse colon. Some adhesions noted between the anterior liver surface and the diaphragm on the right. Small miliary disease noted on bilateral diaphragms, small bowel, small bowel mesentery, abdominal and pelvic peritoneum. No ascites. Complete retroperitonealization of the pelvic organs. Multiple loops of small bowel were densely adherent to the uterine fundus without obvious obstruction. Terminal ileum and cecum also adherent along the right pelvic sidewall. Sigmoid colon densely adherent to a loop of ileum and to the lateral aspect of the retroperitonealized pelvis on the left. After opening the retroperitoneum bilaterally and transecting the round ligaments, the bladder flap was able to be partially developed anteriorly. This revealed dense adhesions of the sigmoid to the  lateral uterus. Given prior discussions with the patient, I did not feel that proceeding with further attempt at lysis of adhesions (some mobilization of two loops of ileum adherent to the uterine fundus was performed) would be possible without significant risk of injury to multiple loops of small bowel and to the sigmoid colon. 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 On laparotomy, 8 x 4 cm omental cake noted. No disease visualized or palpated within the lesser sac. There was no palpable disease along the liver edges or spleen. Stomach was normal in appearance. Some miliary disease palpated along the abdominal peritoneum. Numerous miliary disease noted along the small bowel and small bowel mesentery. Numerous prominent lymph nodes (1cm or less) within the stalk of the small bowel mesentery.  R1 resection based on miliary disease (in the setting of incomplete pelvic evaluation).   10/01/2022 Pathology Results   A. Pelvic sidewall, right, excision - High-grade serous carcinoma   B. Omentum, resection - High-grade serous carcinoma - Treatment effect present - See comment   C. Small bowel, implant, excision - Infarcted epiploic appendage - Negative for carcinoma  Immunohistochemical stains were performed on block B1. The tumor cells are positive for WT1, compatible with tubo-ovarian or peritoneal origin. p53 demonstrates faint positivity in rare cells, favored to be most consistent with a null pattern.    11/05/2022 Genetic Testing   Negative genetic testing on the [facility] Multi-cancer + RNA panel.  The report date is November 05, 2022.  The Multi-Cancer + RNA Panel offered by [facility] includes sequencing and/or deletion/duplication analysis of the following 70 genes:  AIP*, ALK, APC*, ATM*, AXIN2*, BAP1*, BARD1*, BLM*, BMPR1A*, BRCA1*, BRCA2*, BRIP1*, CDC73*, CDH1*, CDK4, CDKN1B*, CDKN2A, CHEK2*, CTNNA1*, DICER1*, EPCAM (del/dup only), EGFR, FH*,  FLCN*,  GREM1 (promoter dup only), HOXB13, KIT, LZTR1, MAX*, MBD4, MEN1*, MET, MITF, MLH1*, MSH2*, MSH3*, MSH6*, MUTYH*, NF1*, NF2*, NTHL1*, PALB2*, PDGFRA, PMS2*, POLD1*, POLE*, POT1*, PRKAR1A*, PTCH1*, PTEN*, RAD51C*, RAD51D*, RB1*, RET, SDHA* (sequencing only), SDHAF2*, SDHB*, SDHC*, SDHD*, SMAD4*, SMARCA4*, SMARCB1*, SMARCE1*, STK11*, SUFU*, TMEM127*, TP53*, TSC1*, TSC2*, VHL*. RNA analysis is performed for * genes.       INTERVAL HISTORY:   Nicole Santos is a 52 y.o. female presenting to clinic today for follow up of high-grade serous carcinoma of peritoneum. She was last seen by me on 11/26/22.  Today, she states that she is doing well overall. Her appetite level is at 100%. Her energy level is at 100%.  She denies any issues with treatment, including nausea, vomiting, bleeding, fever, diarrhea or constipation. She is taking Cymbalta as prescribed.   She notes her white cell booster shot gives her a week of diarrhea. She reports that Imodium and Lomotil did not help with diarrhea. She notes she lost 11 pounds in 4 days with her last episode of diarrhea following her white cell booster shot.   She reports numbness has spread to the bottom of her feet, but denies any pain or tingling to the area.   She is taking magnesium and potassium without issues.   PAST MEDICAL HISTORY:   Past Medical History: Past Medical History:  Diagnosis Date   Acute respiratory disease due to COVID-19 virus 02/02/2019   Anxiety    Cancer (HCC)    Depression    Dyspnea    Dysrhythmia    Family history of bladder cancer    Family history of breast cancer    History of diabetes mellitus    Resolved in Sept 2023 with weight loss.   Hypertension    Ovarian cancer (HCC) 11/14/2021   Peritoneal carcinomatosis (HCC)    Pneumonia    Sleep apnea    doesn't use CPAP   Tachycardia     Surgical History: Past Surgical History:  Procedure Laterality Date   KNEE SURGERY Right    Arthroscopic   LAPAROSCOPIC ABDOMINAL  EXPLORATION N/A 11/13/2021   Procedure: Exploratory laparoscopy;  Surgeon: Franky Macho, MD;  Location: [facility];  Service: General;  Laterality: N/A;   PORTACATH PLACEMENT Left 11/15/2021   Procedure: INSERTION PORT-A-CATH;  Surgeon: Franky Macho, MD;  Location: [facility];  Service: General;  Laterality: Left;   TONSILLECTOMY AND ADENOIDECTOMY      Social History: Social History   Socioeconomic History   Marital status: Married    Spouse name: Not on file   Number of children: Not on file   Years of education: Not on file   Highest education level: Not on file  Occupational History   Occupation: Unemployed  Tobacco Use   Smoking status: Never   Smokeless tobacco: Never  Vaping Use   Vaping status: Never Used  Substance and Sexual Activity   Alcohol use: No   Drug use: No   Sexual activity: Not Currently  Other Topics Concern   Not on file  Social History Narrative   Not on file   Social Determinants of Health   Financial Resource Strain: Not on file  Food Insecurity: No Food Insecurity (03/06/2022)   Hunger Vital Sign    Worried About Running Out of Food in the Last Year: Never true    Ran Out of Food in the Last Year: Never true  Transportation Needs: No Transportation Needs (03/06/2022)   PRAPARE - Transportation    Lack  of Transportation (Medical): No    Lack of Transportation (Non-Medical): No  Physical Activity: Not on file  Stress: Not on file  Social Connections: Not on file  Intimate Partner Violence: Not At Risk (03/06/2022)   Humiliation, Afraid, Rape, and Kick questionnaire    Fear of Current or Ex-Partner: No    Emotionally Abused: No    Physically Abused: No    Sexually Abused: No    Family History: Family History  Problem Relation Age of Onset   Hypertension Mother    Heart attack Paternal Uncle    Bladder Cancer Maternal Grandmother 49       non-smoker   Dementia Maternal Grandfather    Breast cancer Maternal Great-grandmother        MGF's mother    Colon cancer Neg Hx    Ovarian cancer Neg Hx    Endometrial cancer Neg Hx    Pancreatic cancer Neg Hx    Prostate cancer Neg Hx     Current Medications:  Current Outpatient Medications:    apixaban (ELIQUIS) 5 MG TABS tablet, Take 1 tablet (5 mg total) by mouth 2 (two) times daily., Disp: 180 tablet, Rfl: 3   atorvastatin (LIPITOR) 20 MG tablet, Take 1 tablet (20 mg total) by mouth at bedtime., Disp: 90 tablet, Rfl: 3   diphenoxylate-atropine (LOMOTIL) 2.5-0.025 MG tablet, Take 2 tablets by mouth after the first watery bowel movement. May then take 1 pill by mouth after each watery bowel movement. Do not exceed 8 pills in a 24 hour period., Disp: 60 tablet, Rfl: 3   DULoxetine (CYMBALTA) 30 MG capsule, TAKE 1 CAPSULE BY MOUTH DAILY FOR 7 DAYS, THEN INCREASE TO 2 CAPSULES DAILY, Disp: 60 capsule, Rfl: 5   magnesium oxide (MAG-OX) 400 (240 Mg) MG tablet, Take 1 tablet (400 mg total) by mouth in the morning, at noon, and at bedtime., Disp: 90 tablet, Rfl: 2   Multiple Vitamin (MULTIVITAMIN) capsule, Take 1 capsule by mouth daily., Disp: , Rfl:    Potassium 99 MG TABS, Take 99 mg by mouth at bedtime., Disp: , Rfl:    pregabalin (LYRICA) 100 MG capsule, Take 100 mg by mouth 2 (two) times daily., Disp: , Rfl:    prochlorperazine (COMPAZINE) 10 MG tablet, Take 1 tablet (10 mg total) by mouth every 6 (six) hours as needed for nausea or vomiting., Disp: 30 tablet, Rfl: 3 No current facility-administered medications for this visit.  Facility-Administered Medications Ordered in Other Visits:    heparin lock flush 100 unit/mL, 500 Units, Intravenous, Once, [name], MD   sodium chloride flush (NS) 0.9 % injection 10 mL, 10 mL, Intravenous, PRN, [name], MD   Allergies: Allergies  Allergen Reactions   Augmentin [Amoxicillin-Pot Clavulanate] Diarrhea and Nausea And Vomiting   Doxycycline     Abdominal cramps.   Topamax [Topiramate] Nausea And Vomiting    REVIEW OF  SYSTEMS:   Review of Systems  Constitutional:  Negative for chills, fatigue and fever.  HENT:   Negative for lump/mass, mouth sores, nosebleeds, sore throat and trouble swallowing.   Eyes:  Negative for eye problems.  Respiratory:  Negative for cough and shortness of breath.   Cardiovascular:  Negative for chest pain, leg swelling and palpitations.  Gastrointestinal:  Positive for diarrhea. Negative for abdominal pain, constipation, nausea and vomiting.  Genitourinary:  Negative for bladder incontinence, difficulty urinating, dysuria, frequency, hematuria and nocturia.   Musculoskeletal:  Negative for arthralgias, back pain, flank pain, myalgias and neck pain.  Skin:  Negative for itching and rash.  Neurological:  Positive for numbness (in feet). Negative for dizziness and headaches.  Hematological:  Does not bruise/bleed easily.  Psychiatric/Behavioral:  Positive for sleep disturbance. Negative for depression and suicidal ideas. The patient is not nervous/anxious.   All other systems reviewed and are negative.    VITALS:   There were no vitals taken for this visit.  Wt Readings from Last 3 Encounters:  11/26/22 201 lb 6.4 oz (91.4 kg)  11/05/22 193 lb 6.4 oz (87.7 kg)  10/30/22 201 lb 3.2 oz (91.3 kg)    There is no height or weight on file to calculate BMI.  Performance status (ECOG): 1 - Symptomatic but completely ambulatory  PHYSICAL EXAM:   Physical Exam Vitals and nursing note reviewed. Exam conducted with a chaperone present.  Constitutional:      Appearance: Normal appearance.  Cardiovascular:     Rate and Rhythm: Normal rate and regular rhythm.     Pulses: Normal pulses.     Heart sounds: Normal heart sounds.  Pulmonary:     Effort: Pulmonary effort is normal.     Breath sounds: Normal breath sounds.  Abdominal:     Palpations: Abdomen is soft. There is no hepatomegaly, splenomegaly or mass.     Tenderness: There is no abdominal tenderness.  Musculoskeletal:      Right lower leg: No edema.     Left lower leg: No edema.  Lymphadenopathy:     Cervical: No cervical adenopathy.     Right cervical: No superficial, deep or posterior cervical adenopathy.    Left cervical: No superficial, deep or posterior cervical adenopathy.     Upper Body:     Right upper body: No supraclavicular or axillary adenopathy.     Left upper body: No supraclavicular or axillary adenopathy.  Neurological:     General: No focal deficit present.     Mental Status: She is alert and oriented to person, place, and time.  Psychiatric:        Mood and Affect: Mood normal.        Behavior: Behavior normal.     LABS:      Latest Ref Rng & Units 11/26/2022    7:59 AM 11/05/2022    7:52 AM 08/29/2022    8:25 AM  CBC  WBC 4.0 - 10.5 K/uL 5.7  6.7  3.8   Hemoglobin 12.0 - 15.0 g/dL 16.1  09.6  9.9   Hematocrit 36.0 - 46.0 % 35.4  37.6  29.7   Platelets 150 - 400 K/uL 153  230  118       Latest Ref Rng & Units 11/26/2022    7:59 AM 11/05/2022    7:52 AM 08/29/2022    8:25 AM  CMP  Glucose 70 - 99 mg/dL 045  409  811   BUN 6 - 20 mg/dL 10  13  14    Creatinine 0.44 - 1.00 mg/dL 9.14  7.82  9.56   Sodium 135 - 145 mmol/L 136  138  136   Potassium 3.5 - 5.1 mmol/L 4.2  3.5  3.8   Chloride 98 - 111 mmol/L 103  102  105   CO2 22 - 32 mmol/L 24  26  25    Calcium 8.9 - 10.3 mg/dL 8.5  9.1  8.4   Total Protein 6.5 - 8.1 g/dL 6.9  7.1  6.5   Total Bilirubin 0.3 - 1.2 mg/dL 0.8  1.2  0.5  Alkaline Phos 38 - 126 U/L 56  55  64   AST 15 - 41 U/L 17  18  13    ALT 0 - 44 U/L 16  17  12       Lab Results  Component Value Date   CEA1 1.1 11/13/2021   /  CEA  Date Value Ref Range Status  11/13/2021 1.1 0.0 - 4.7 ng/mL Final    Comment:    (NOTE)                             Nonsmokers          <3.9                             Smokers             <5.6 [employer] Electrochemiluminescence Immunoassay (ECLIA) Values obtained with different assay methods or kits cannot be  used interchangeably.  Results cannot be interpreted as absolute evidence of the presence or absence of malignant disease. Performed At: [facility] [address] [place], [name] [id number] Jolene Schimke MD [id number]    No results found for: "PSA1" Lab Results  Component Value Date   CAN199 6 11/13/2021   Lab Results  Component Value Date   CAN125 11.2 11/26/2022    No results found for: "TOTALPROTELP", "ALBUMINELP", "A1GS", "A2GS", "BETS", "BETA2SER", "GAMS", "MSPIKE", "SPEI" Lab Results  Component Value Date   FERRITIN 356 (H) 02/07/2019   FERRITIN 311 (H) 02/06/2019   FERRITIN 343 (H) 02/05/2019   Lab Results  Component Value Date   LDH 259 (H) 02/02/2019     STUDIES:   No results found.

## 2022-12-17 ENCOUNTER — Telehealth: Payer: Self-pay | Admitting: Pharmacist

## 2022-12-17 ENCOUNTER — Other Ambulatory Visit: Payer: Self-pay

## 2022-12-17 ENCOUNTER — Telehealth: Payer: Self-pay

## 2022-12-17 ENCOUNTER — Encounter: Payer: Self-pay | Admitting: Hematology

## 2022-12-17 ENCOUNTER — Other Ambulatory Visit (HOSPITAL_COMMUNITY): Payer: Self-pay

## 2022-12-17 ENCOUNTER — Inpatient Hospital Stay: Payer: Commercial Managed Care - PPO | Attending: Gynecologic Oncology

## 2022-12-17 ENCOUNTER — Inpatient Hospital Stay: Payer: Commercial Managed Care - PPO

## 2022-12-17 ENCOUNTER — Inpatient Hospital Stay (HOSPITAL_BASED_OUTPATIENT_CLINIC_OR_DEPARTMENT_OTHER): Payer: Commercial Managed Care - PPO | Admitting: Hematology

## 2022-12-17 VITALS — BP 152/90 | HR 99 | Temp 97.6°F | Resp 20 | Wt 199.4 lb

## 2022-12-17 VITALS — BP 136/72 | HR 79 | Temp 97.6°F | Resp 18

## 2022-12-17 DIAGNOSIS — C482 Malignant neoplasm of peritoneum, unspecified: Secondary | ICD-10-CM | POA: Diagnosis present

## 2022-12-17 DIAGNOSIS — Z79899 Other long term (current) drug therapy: Secondary | ICD-10-CM | POA: Insufficient documentation

## 2022-12-17 DIAGNOSIS — Z95828 Presence of other vascular implants and grafts: Secondary | ICD-10-CM

## 2022-12-17 DIAGNOSIS — Z5111 Encounter for antineoplastic chemotherapy: Secondary | ICD-10-CM | POA: Diagnosis present

## 2022-12-17 LAB — COMPREHENSIVE METABOLIC PANEL
ALT: 15 U/L (ref 0–44)
AST: 16 U/L (ref 15–41)
Albumin: 3.2 g/dL — ABNORMAL LOW (ref 3.5–5.0)
Alkaline Phosphatase: 56 U/L (ref 38–126)
Anion gap: 6 (ref 5–15)
BUN: 9 mg/dL (ref 6–20)
CO2: 26 mmol/L (ref 22–32)
Calcium: 8.6 mg/dL — ABNORMAL LOW (ref 8.9–10.3)
Chloride: 106 mmol/L (ref 98–111)
Creatinine, Ser: 0.6 mg/dL (ref 0.44–1.00)
GFR, Estimated: >60 mL/min (ref >60)
Glucose, Bld: 121 mg/dL — ABNORMAL HIGH (ref 70–99)
Potassium: 4.2 mmol/L (ref 3.5–5.1)
Sodium: 138 mmol/L (ref 135–145)
Total Bilirubin: 0.5 mg/dL (ref 0.3–1.2)
Total Protein: 6.5 g/dL (ref 6.5–8.1)

## 2022-12-17 LAB — CBC WITH DIFFERENTIAL/PLATELET
Abs Immature Granulocytes: 0.02 10*3/uL (ref 0.00–0.07)
Basophils Absolute: 0 10*3/uL (ref 0.0–0.1)
Basophils Relative: 1 %
Eosinophils Absolute: 0 10*3/uL (ref 0.0–0.5)
Eosinophils Relative: 1 %
HCT: 31.8 % — ABNORMAL LOW (ref 36.0–46.0)
Hemoglobin: 10.2 g/dL — ABNORMAL LOW (ref 12.0–15.0)
Immature Granulocytes: 1 %
Lymphocytes Relative: 30 %
Lymphs Abs: 1.3 10*3/uL (ref 0.7–4.0)
MCH: 32.7 pg (ref 26.0–34.0)
MCHC: 32.1 g/dL (ref 30.0–36.0)
MCV: 101.9 fL — ABNORMAL HIGH (ref 80.0–100.0)
Monocytes Absolute: 0.7 10*3/uL (ref 0.1–1.0)
Monocytes Relative: 15 %
Neutro Abs: 2.4 10*3/uL (ref 1.7–7.7)
Neutrophils Relative %: 52 %
Platelets: 147 10*3/uL — ABNORMAL LOW (ref 150–400)
RBC: 3.12 MIL/uL — ABNORMAL LOW (ref 3.87–5.11)
RDW: 16.2 % — ABNORMAL HIGH (ref 11.5–15.5)
WBC: 4.4 10*3/uL (ref 4.0–10.5)
nRBC: 0 % (ref 0.0–0.2)

## 2022-12-17 LAB — URINALYSIS, DIPSTICK ONLY
Bilirubin Urine: NEGATIVE
Glucose, UA: NEGATIVE mg/dL
Hgb urine dipstick: NEGATIVE
Ketones, ur: NEGATIVE mg/dL
Leukocytes,Ua: NEGATIVE
Nitrite: NEGATIVE
Protein, ur: NEGATIVE mg/dL
Specific Gravity, Urine: 1.019 (ref 1.005–1.030)
pH: 5 (ref 5.0–8.0)

## 2022-12-17 LAB — MAGNESIUM: Magnesium: 1.7 mg/dL (ref 1.7–2.4)

## 2022-12-17 MED ORDER — SODIUM CHLORIDE 0.9% FLUSH
10.0000 mL | INTRAVENOUS | Status: DC | PRN
  Administered 2022-12-17: 10 mL

## 2022-12-17 MED ORDER — CETIRIZINE HCL 10 MG/ML IV SOLN
10.0000 mg | Freq: Once | INTRAVENOUS | Status: AC
  Administered 2022-12-17: 10 mg via INTRAVENOUS
  Filled 2022-12-17: qty 1

## 2022-12-17 MED ORDER — SODIUM CHLORIDE 0.9 % IV SOLN: Freq: Once | INTRAVENOUS | Status: AC

## 2022-12-17 MED ORDER — HEPARIN SOD (PORK) LOCK FLUSH 100 UNIT/ML IV SOLN
500.0000 [IU] | Freq: Once | INTRAVENOUS | Status: AC | PRN
  Administered 2022-12-17: 500 [IU]

## 2022-12-17 MED ORDER — SODIUM CHLORIDE 0.9 % IV SOLN
140.0000 mg/m2 | Freq: Once | INTRAVENOUS | Status: AC
  Administered 2022-12-17: 276 mg via INTRAVENOUS
  Filled 2022-12-17: qty 46

## 2022-12-17 MED ORDER — PALONOSETRON HCL INJECTION 0.25 MG/5ML
0.2500 mg | Freq: Once | INTRAVENOUS | Status: AC
  Administered 2022-12-17: 0.25 mg via INTRAVENOUS
  Filled 2022-12-17: qty 5

## 2022-12-17 MED ORDER — SODIUM CHLORIDE 0.9 % IV SOLN
15.0000 mg/kg | Freq: Once | INTRAVENOUS | Status: AC
  Administered 2022-12-17: 1300 mg via INTRAVENOUS
  Filled 2022-12-17: qty 48

## 2022-12-17 MED ORDER — FAMOTIDINE IN NACL 20-0.9 MG/50ML-% IV SOLN
20.0000 mg | Freq: Once | INTRAVENOUS | Status: AC
  Administered 2022-12-17: 20 mg via INTRAVENOUS
  Filled 2022-12-17: qty 50

## 2022-12-17 MED ORDER — SODIUM CHLORIDE 0.9% FLUSH
10.0000 mL | INTRAVENOUS | Status: DC | PRN
  Administered 2022-12-17: 10 mL via INTRAVENOUS

## 2022-12-17 MED ORDER — LIDOCAINE-PRILOCAINE 2.5-2.5 % EX CREA: TOPICAL_CREAM | CUTANEOUS | 3 refills | Status: DC

## 2022-12-17 MED ORDER — SODIUM CHLORIDE 0.9 % IV SOLN
720.0000 mg | Freq: Once | INTRAVENOUS | Status: AC
  Administered 2022-12-17: 720 mg via INTRAVENOUS
  Filled 2022-12-17: qty 72

## 2022-12-17 MED ORDER — OLAPARIB 150 MG PO TABS
300.0000 mg | ORAL_TABLET | Freq: Two times a day (BID) | ORAL | 3 refills | Status: DC
Start: 2022-12-17 — End: 2022-12-17
  Filled 2022-12-17: qty 60, 15d supply, fill #0

## 2022-12-17 MED ORDER — OLAPARIB 150 MG PO TABS
300.0000 mg | ORAL_TABLET | Freq: Two times a day (BID) | ORAL | 3 refills | Status: DC
Start: 2022-12-17 — End: 2023-04-15

## 2022-12-17 MED ORDER — SODIUM CHLORIDE 0.9 % IV SOLN
150.0000 mg | Freq: Once | INTRAVENOUS | Status: AC
  Administered 2022-12-17: 150 mg via INTRAVENOUS
  Filled 2022-12-17: qty 150

## 2022-12-17 MED ORDER — SODIUM CHLORIDE 0.9 % IV SOLN
10.0000 mg | Freq: Once | INTRAVENOUS | Status: AC
  Administered 2022-12-17: 10 mg via INTRAVENOUS
  Filled 2022-12-17: qty 10

## 2022-12-17 NOTE — Patient Instructions (Signed)

## 2022-12-17 NOTE — Progress Notes (Signed)
Patient has been examined by Dr. Katragadda. Vital signs and labs have been reviewed by MD - ANC, Creatinine, LFTs, hemoglobin, and platelets are within treatment parameters per M.D. - pt may proceed with treatment.  Primary RN and pharmacy notified.  

## 2022-12-17 NOTE — Progress Notes (Addendum)
Patient is holding Neulasta this cycle. Paclitaxel diluted in NS276mL, Excel bag, [id number], Exp: 08/31/2024.   Anola Gurney [place], [place], BCPS, BCOP 12/17/2022 10:48 AM

## 2022-12-17 NOTE — Progress Notes (Signed)
Patient presents today for chemotherapy/immunotherapy infusion  of MVASI, Taxol, and Carboplatin. Patient is in satisfactory condition with no new complaints voiced.  Vital signs are stable.  Labs reviewed by Dr. Ellin Saba during the office visit and all labs are within treatment parameters.  We will proceed with treatment per MD orders.

## 2022-12-17 NOTE — Progress Notes (Signed)
Patients port flushed without difficulty.  Good blood return noted with no bruising or swelling noted at site.  Patient remains accessed for treatment.  

## 2022-12-17 NOTE — Progress Notes (Signed)

## 2022-12-17 NOTE — Patient Instructions (Signed)
[facility]  Discharge Instructions: Thank you for choosing [facility] to provide your oncology and hematology care.  If you have a lab appointment with the Cancer Center - please note that after April 8th, 2024, all labs will be drawn in the cancer center.  You do not have to check in or register with the main entrance as you have in the past but will complete your check-in in the cancer center.  Wear comfortable clothing and clothing appropriate for easy access to any Portacath or PICC line.   We strive to give you quality time with your provider. You may need to reschedule your appointment if you arrive late (15 or more minutes).  Arriving late affects you and other patients whose appointments are after yours.  Also, if you miss three or more appointments without notifying the office, you may be dismissed from the clinic at the provider's discretion.      For prescription refill requests, have your pharmacy contact our office and allow 72 hours for refills to be completed.    Today you received the following chemotherapy and/or immunotherapy agents taxol, carboplatin, mvasi.       To help prevent nausea and vomiting after your treatment, we encourage you to take your nausea medication as directed.  BELOW ARE SYMPTOMS THAT SHOULD BE REPORTED IMMEDIATELY: *FEVER GREATER THAN 100.4 F (38 C) OR HIGHER *CHILLS OR SWEATING *NAUSEA AND VOMITING THAT IS NOT CONTROLLED WITH YOUR NAUSEA MEDICATION *UNUSUAL SHORTNESS OF BREATH *UNUSUAL BRUISING OR BLEEDING *URINARY PROBLEMS (pain or burning when urinating, or frequent urination) *BOWEL PROBLEMS (unusual diarrhea, constipation, pain near the anus) TENDERNESS IN MOUTH AND THROAT WITH OR WITHOUT PRESENCE OF ULCERS (sore throat, sores in mouth, or a toothache) UNUSUAL RASH, SWELLING OR PAIN  UNUSUAL VAGINAL DISCHARGE OR ITCHING   Items with * indicate a potential emergency and should be followed up as soon as  possible or go to the Emergency Department if any problems should occur.  Please show the CHEMOTHERAPY ALERT CARD or IMMUNOTHERAPY ALERT CARD at check-in to the Emergency Department and triage nurse.  Should you have questions after your visit or need to cancel or reschedule your appointment, please contact [facility] [phone]  and follow the prompts.  Office hours are 8:00 a.m. to 4:30 p.m. Monday - Friday. Please note that voicemails left after 4:00 p.m. may not be returned until the following business day.  We are closed weekends and major holidays. You have access to a nurse at all times for urgent questions. Please call the main number to the clinic [phone] and follow the prompts.  For any non-urgent questions, you may also contact your provider using MyChart. We now offer e-Visits for anyone 68 and older to request care online for non-urgent symptoms. For details visit [email or web address].   Also download the MyChart app! Go to the app store, search "MyChart", open the app, select [place], and log in with your MyChart username and password.

## 2022-12-17 NOTE — Telephone Encounter (Signed)
Oral Oncology Patient Advocate Encounter   Was successful in obtaining a copay card for Lynparza.  This copay card will make the patients copay ~$0.00.   The billing information is as follows and has been shared with [facility].   RxBin: [id number] PCN: [id number] Member ID: [id number] Group ID: [id number]   Ardeen Fillers, CPhT Oncology Pharmacy Patient Advocate  [facility]  [phone] (phone) [phone] (fax) 12/17/2022 11:46 AM

## 2022-12-17 NOTE — Telephone Encounter (Signed)
Clinical Pharmacist Practitioner Encounter   Received new prescription for Lynparza (olaparib) for the maintenace treatment of high-grade serous carcinoma of peritoneum, HRD+, in conjunction with bevacizumab, planned duration until disease progression or unacceptable drug toxicity.  CMP from 12/17/22 assessed, no relevant lab abnormalities. Prescription dose and frequency assessed.   Current medication list in Epic reviewed, no DDIs with olaparib identified.  Evaluated chart and no patient barriers to medication adherence identified.   Prescription has been e-scribed to the [facility] for benefits analysis and approval.  Oral Oncology Clinic will continue to follow for insurance authorization, copayment issues, initial counseling and start date.   Remi Haggard, PharmD, BCPS, BCOP, CPP Hematology/Oncology Clinical Pharmacist Practitioner [place] [phone]  12/17/2022 11:39 AM

## 2022-12-17 NOTE — Telephone Encounter (Addendum)
Oral Oncology Patient Advocate Encounter  After completing a benefits investigation, prior authorization for Garey Ham is not required at this time through [employer].  Patient must fill through [facility] per insurance.    Obtained co-pay card to bring co-pay to ~$0.00.   Script has been e-scribed to [facility] and all supporting documents, including co-pay card billing information, have been sent as well.    Ardeen Fillers, CPhT Oncology Pharmacy Patient Advocate  [facility]  [phone] (phone) [phone] (fax) 12/17/2022 11:33 AM

## 2022-12-18 ENCOUNTER — Other Ambulatory Visit (HOSPITAL_COMMUNITY): Payer: Self-pay

## 2022-12-18 NOTE — Telephone Encounter (Signed)
Clinical Pharmacist Practitioner Encounter   Patient is awaiting medication delivery from [facility]. She knows not to start until 01/07/23.  Patient Education I spoke with patient for overview of new oral chemotherapy medication: Lynparza (olaparib) for the maintenace treatment of high-grade serous carcinoma of peritoneum, HRD+, in conjunction with bevacizumab, planned duration until disease progression or unacceptable drug toxicity. Planned started 01/07/23  Counseled patient on administration, dosing, side effects, monitoring, drug-food interactions, safe handling, storage, and disposal. Patient will take 2 tablets (300 mg total) by mouth 2 (two) times daily. Swallow whole. May take with food to decrease nausea and vomiting .  Side effects include but not limited to: diarrhea, nausea, decreased hgb.    Reviewed with patient importance of keeping a medication schedule and plan for any missed doses.  After discussion with patient no patient barriers to medication adherence identified.   Nicole Santos voiced understanding and appreciation. All questions answered. Medication handout provided.  Provided patient with Oral Chemotherapy Navigation Clinic phone number. Patient knows to call the office with questions or concerns. Oral Chemotherapy Navigation Clinic will continue to follow.  Remi Haggard, PharmD, BCPS, BCOP, CPP Hematology/Oncology Clinical Pharmacist Practitioner [place] [phone]  12/18/2022 2:54 PM

## 2022-12-19 ENCOUNTER — Inpatient Hospital Stay: Payer: Commercial Managed Care - PPO

## 2022-12-21 NOTE — Telephone Encounter (Signed)
Called [facility] to check status of Lynparza delivery. Informed by representative that patient has scheduled delivery for Wednesday, 12/26/22.    Ardeen Fillers, CPhT Oncology Pharmacy Patient Advocate  [facility]  [phone] (phone) [phone] (fax) 12/21/2022 11:29 AM

## 2022-12-24 ENCOUNTER — Other Ambulatory Visit: Payer: Self-pay | Admitting: *Deleted

## 2022-12-24 ENCOUNTER — Other Ambulatory Visit: Payer: Self-pay | Admitting: Hematology

## 2022-12-24 ENCOUNTER — Other Ambulatory Visit: Payer: Self-pay

## 2022-12-24 MED ORDER — DIPHENOXYLATE-ATROPINE 2.5-0.025 MG PO TABS: ORAL_TABLET | ORAL | 3 refills | Status: DC

## 2022-12-26 ENCOUNTER — Encounter: Payer: Self-pay | Admitting: Hematology

## 2023-01-04 ENCOUNTER — Encounter: Payer: Self-pay | Admitting: Hematology

## 2023-01-06 NOTE — Progress Notes (Signed)
[facility] [address] [place] [address]    Clinic Day:  01/07/23    Referring physician: [name], DO  Patient Care Team: Nicole Ip, DO as PCP - General (Family Medicine) Nicole Copas, MD as Referring Physician (Optometry) Nicole Massed, MD as Medical Oncologist (Medical Oncology) Nicole Sarah, RN as Oncology Nurse Navigator (Medical Oncology)   ASSESSMENT & PLAN:   Assessment: 1. High-grade serous carcinoma of peritoneum: - She had developed sharp left-sided abdominal pain since Mother's Day, occasional radiating to the back.  40 pound weight loss in the last 6 months. - Personal history of cervical cancer in 2000, status post chemo plus XRT at [facility]  She has slightly decreased hearing in the left ear since then. - US abdomen (11/08/2021): Cholelithiasis with gallbladder wall thickening and sonographic Murphy sign.  Upper abdominal ascites. - Laparoscopy (11/13/2021): Starting of the omentum as well as the upper abdominal wall and diaphragm.  Liver was within normal limits.  6 L of ascites was evacuated.  Omental and peritoneal biopsies were sent.  Peritoneal implants throughout and a significant portion of omentum and on the small intestine. - CT CAP (11/13/2021): Ascites, extensive peritoneal thickening, nodularity and omental caking.  No evidence of primary malignancy in the chest, abdomen or pelvis.  Small left pleural effusion, nonspecific.  Cholelithiasis. - US pelvis (11/14/2021): Uterus is grossly unremarkable.  Ovaries not visualized. - CA125 (11/13/2021): 1269 - Pathology: Peritoneum and omental biopsy consistent with high-grade serous carcinoma.  HER2 2+ by IHC and negative by FISH. - 4 cycles of carboplatin and paclitaxel from 11/21/2021 through 01/23/2022, cycle 9 on 08/29/2022. - 10/01/2022: Diagnostic laparoscopy, exploratory laparotomy, omentectomy - Pathology: Right pelvic sidewall excision-high-grade  serous carcinoma, omental resection-high-grade serous carcinoma with treatment effect present.  Small bowel implant excision was negative for carcinoma. - Germline mutation testing negative - Foundation 1 NGS (10/01/2022): LOH-18.9%, HRD +, MS-stable, TMB-low - Olaparib 300 mg twice daily and bevacizumab maintenance started on 01/07/2023  2. Social/family history: - Lives at home with her husband.  Worked in Engineering geologist at [employer].  Non-smoker. - Maternal grandmother with bladder cancer.  Maternal great grandmother with breast cancer.    Plan: High-grade serous carcinoma of peritoneum: - She has tolerated last cycle of chemotherapy very well. - Reviewed labs from today: Normal LFTs.  Creatinine normal.  CBC grossly normal. - Recommend that she start Lynparza today at a dose of 300 mg twice daily.  Discussed side effects in detail.  She will proceed with maintenance bevacizumab today.  RTC 3 weeks for follow-up.   2.  Peripheral neuropathy: - Continue Cymbalta 60 mg daily.  Numbness has spread from the toes to the balls of the feet.  3.  Diarrhea: - Continue Lomotil as needed.  4.  Hypomagnesemia: - Magnesium today is 1.3.  She will receive IV magnesium.  Continue magnesium 3 times daily.   5.  Hypokalemia: - Continue potassium 20 mEq daily.  Potassium today is normal.   6.  Subsegmental pulmonary embolism (Dx 03/06/2022): - Continue Eliquis twice daily.  No bleeding issues.    Orders Placed This Encounter  Procedures   CBC with Differential    Standing Status:   Future    Standing Expiration Date:   01/29/2024   Comprehensive metabolic panel    Standing Status:   Future    Standing Expiration Date:   01/29/2024   CBC with Differential    Standing Status:  Future    Standing Expiration Date:   02/19/2024   Comprehensive metabolic panel    Standing Status:   Future    Standing Expiration Date:   02/19/2024   Urinalysis, dipstick only    Standing Status:   Future    Standing Expiration  Date:   02/19/2024   CBC with Differential    Standing Status:   Future    Standing Expiration Date:   03/11/2024   Comprehensive metabolic panel    Standing Status:   Future    Standing Expiration Date:   03/11/2024   CBC with Differential    Standing Status:   Future    Standing Expiration Date:   04/01/2024   Comprehensive metabolic panel    Standing Status:   Future    Standing Expiration Date:   04/01/2024   CBC with Differential    Standing Status:   Future    Standing Expiration Date:   04/22/2024   Comprehensive metabolic panel    Standing Status:   Future    Standing Expiration Date:   04/22/2024   Urinalysis, dipstick only    Standing Status:   Future    Standing Expiration Date:   04/22/2024   CBC with Differential    Standing Status:   Future    Standing Expiration Date:   05/13/2024   Comprehensive metabolic panel    Standing Status:   Future    Standing Expiration Date:   05/13/2024   CBC with Differential    Standing Status:   Future    Standing Expiration Date:   06/03/2024   Comprehensive metabolic panel    Standing Status:   Future    Standing Expiration Date:   06/03/2024   Magnesium    Standing Status:   Future    Standing Expiration Date:   01/29/2024   Magnesium    Standing Status:   Future    Standing Expiration Date:   02/19/2024   Magnesium    Standing Status:   Future    Standing Expiration Date:   03/11/2024   Magnesium    Standing Status:   Future    Standing Expiration Date:   04/01/2024   Magnesium    Standing Status:   Future    Standing Expiration Date:   04/22/2024   Magnesium    Standing Status:   Future    Standing Expiration Date:   05/13/2024   Magnesium    Standing Status:   Future    Standing Expiration Date:   06/03/2024       Nicole Santos R Nicole Santos,acting as a scribe for Nicole Massed, MD.,have documented all relevant documentation on the behalf of Nicole Massed, MD,as directed by  Nicole Massed, MD while in  the presence of Nicole Massed, MD.  I, Nicole Massed MD, have reviewed the above documentation for accuracy and completeness, and I agree with the above.     Nicole Massed, MD   8/5/20246:12 PM  CHIEF COMPLAINT:   Diagnosis: high-grade serous carcinoma of peritoneum    Cancer Staging  Primary peritoneal carcinomatosis [facility] [place] [facility] Staging form: Ovary, Fallopian Tube, and Primary Peritoneal Carcinoma, AJCC 8th Edition - Clinical stage from 11/17/2021: FIGO Stage IIIC (cT3c, cM0) - Unsigned    Prior Therapy: Carboplatin and paclitaxel-- x 4 cycles 11/21/2021 - 01/23/2022, x5 cycles 06/05/22 - 08/29/22   Current Therapy:  Carboplatin/paclitaxel/bevacizumab    HISTORY OF PRESENT ILLNESS:   Oncology History  Malignant neoplasm of ovary (HCC)  11/15/2021 Initial  Diagnosis   Malignant neoplasm of ovary (HCC)   01/07/2023 -  Chemotherapy   Patient is on Treatment Plan : OVARIAN Bevacizumab q21d     Primary peritoneal carcinomatosis (HCC)  11/13/2021 Imaging   CT A/P: 1. Trace ascites throughout the abdomen and pelvis, with extensive peritoneal thickening, nodularity, and omental caking. Findings are consistent with widespread peritoneal and omental metastatic disease. 2. No evidence of primary malignancy in the chest, abdomen, or pelvis, with specific attention to the most likely etiologies of peritoneal metastatic disease including pancreatic, ovarian, and endometrial malignancy. Although there is no obvious mass, pelvic ultrasound may be helpful to more closely assess the female reproductive organs. 3. Small left pleural effusion and associated atelectasis or consolidation, nonspecific although worrisome for malignant effusion. No overt pleural thickening or nodularity. 4. Scattered areas of nonspecific infectious or inflammatory alveolar ground-glass airspace opacity throughout the lungs. 5. Cholelithiasis without evidence of acute cholecystitis. 6. Nonobstructive  right nephrolithiasis. 7. Postoperative findings of same day laparoscopy including minimal pneumoperitoneum.   Aortic Atherosclerosis (ICD10-I70.0).   11/13/2021 Surgery   Diagnostic laparoscopy with biopsy Dr. Lovell Sheehan Findings: Significant bloody ascites, studding of the omentum as well as the upper abdominal wall and diaphragm.  Nodularity noted along the small intestine.  Omental disease inhibited view of the pelvis.   11/17/2021 Initial Diagnosis   Primary peritoneal carcinomatosis (HCC)   11/21/2021 - 01/25/2022 Chemotherapy   Patient is on Treatment Plan : OVARIAN Carboplatin (AUC 6) / Paclitaxel (175) q21d x 6 cycles     11/21/2021 - 12/17/2022 Chemotherapy   Patient is on Treatment Plan : OVARIAN Carboplatin (AUC 6) + Paclitaxel (175)  + Mvasi q21d X 6 Cycles     01/19/2022 Imaging   CT A/P: 1. Small amount of adherent thrombus or fibrin sheath around the catheter in the LEFT brachiocephalic vein. 2. Small low-attenuation area in the inter lobar fissure region associated with medial segment of LEFT hepatic lobe, new from previous imaging, may represent developing "variable fat deposition" though given history of peritoneal disease would suggest attention on subsequent imaging. 3. Diminished thickening of mesenteric reflections, decreased ascites and decreased omental infiltration since previous imaging. 4. Focal concentric narrowing of the rectosigmoid junction is nonspecific but suspicious for neoplasm potentially related to serosal involvement in the pelvis in this patient with known peritoneal disease from ovarian cancer. Underlying colonic neoplasm would be difficult to exclude as well. Colonic assessment with endoscopy or comparison with recent endoscopy performed is suggested. 5. Cholelithiasis without evidence of acute cholecystitis. 6. 4 mm RIGHT lower pole renal calculus.   Aortic Atherosclerosis (ICD10-I70.0).   03/06/2022 Imaging   CT C/A/P: Chest CT:    Scattered subsegmental pulmonary emboli in the right lung.   Abdominal CT:   1. Mild right hydroureteronephrosis from a 4 mm UVJ calculus. 2. Unchanged omental disease. 3. Cholelithiasis and other chronic findings described above.   05/24/2022 Imaging   Increased peritoneal carcinomatosis and mild ascites.   New moderate distal small bowel obstruction, with transition point in the central pelvis adjacent to the uterine fundus, which may be due to peritoneal carcinomatosis or adhesion.   Cholelithiasis. No radiographic evidence of cholecystitis.   Aortic Atherosclerosis (ICD10-I70.0).   08/21/2022 Imaging   CT A/P: 1. Improved peritoneal carcinomatosis and mild ascites. 2. Prominent/mildly enlarged loops of small bowel throughout the abdomen with transition point in the pelvis, not significantly changed from prior and compatible with a least low-grade obstruction. 3. Cholelithiasis without findings of acute cholecystitis.  10/01/2022 Surgery   Diagnostic laparoscopy, robotic assisted laparoscopic lysis of adhesions, exploratory laparotomy, omentectomy   Findings: On EUA, small uterus within minimal mobility. On intra-abdominal entry, omentum adherent to the anterior abdominal wall at the level of the transverse colon. Some adhesions noted between the anterior liver surface and the diaphragm on the right. Small miliary disease noted on bilateral diaphragms, small bowel, small bowel mesentery, abdominal and pelvic peritoneum. No ascites. Complete retroperitonealization of the pelvic organs. Multiple loops of small bowel were densely adherent to the uterine fundus without obvious obstruction. Terminal ileum and cecum also adherent along the right pelvic sidewall. Sigmoid colon densely adherent to a loop of ileum and to the lateral aspect of the retroperitonealized pelvis on the left. After opening the retroperitoneum bilaterally and transecting the round ligaments, the bladder flap was  able to be partially developed anteriorly. This revealed dense adhesions of the sigmoid to the lateral uterus. Given prior discussions with the patient, I did not feel that proceeding with further attempt at lysis of adhesions (some mobilization of two loops of ileum adherent to the uterine fundus was performed) would be possible without significant risk of injury to multiple loops of small bowel and to the sigmoid colon. 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 On laparotomy, 8 x 4 cm omental cake noted. No disease visualized or palpated within the lesser sac. There was no palpable disease along the liver edges or spleen. Stomach was normal in appearance. Some miliary disease palpated along the abdominal peritoneum. Numerous miliary disease noted along the small bowel and small bowel mesentery. Numerous prominent lymph nodes (1cm or less) within the stalk of the small bowel mesentery.  R1 resection based on miliary disease (in the setting of incomplete pelvic evaluation).   10/01/2022 Pathology Results   A. Pelvic sidewall, right, excision - High-grade serous carcinoma   B. Omentum, resection - High-grade serous carcinoma - Treatment effect present - See comment   C. Small bowel, implant, excision - Infarcted epiploic appendage - Negative for carcinoma  Immunohistochemical stains were performed on block B1. The tumor cells are positive for WT1, compatible with tubo-ovarian or peritoneal origin. p53 demonstrates faint positivity in rare cells, favored to be most consistent with a null pattern.    11/05/2022 Genetic Testing   Negative genetic testing on the [facility] Multi-cancer + RNA panel.  The report date is November 05, 2022.  The Multi-Cancer + RNA Panel offered by [facility] includes sequencing and/or deletion/duplication analysis of the following 70 genes:  AIP*, ALK, APC*, ATM*, AXIN2*, BAP1*, BARD1*, BLM*, BMPR1A*, BRCA1*, BRCA2*, BRIP1*, CDC73*,  CDH1*, CDK4, CDKN1B*, CDKN2A, CHEK2*, CTNNA1*, DICER1*, EPCAM (del/dup only), EGFR, FH*, FLCN*, GREM1 (promoter dup only), HOXB13, KIT, LZTR1, MAX*, MBD4, MEN1*, MET, MITF, MLH1*, MSH2*, MSH3*, MSH6*, MUTYH*, NF1*, NF2*, NTHL1*, PALB2*, PDGFRA, PMS2*, POLD1*, POLE*, POT1*, PRKAR1A*, PTCH1*, PTEN*, RAD51C*, RAD51D*, RB1*, RET, SDHA* (sequencing only), SDHAF2*, SDHB*, SDHC*, SDHD*, SMAD4*, SMARCA4*, SMARCB1*, SMARCE1*, STK11*, SUFU*, TMEM127*, TP53*, TSC1*, TSC2*, VHL*. RNA analysis is performed for * genes.    01/07/2023 -  Chemotherapy   Patient is on Treatment Plan : OVARIAN Bevacizumab q21d        INTERVAL HISTORY:   Nicole Santos is a 52 y.o. female presenting to clinic today for follow up of high-grade serous carcinoma of peritoneum. She was last seen by me on 12/17/22.  Today, she states that she is doing well overall. Her appetite level is at 100%. Her energy level is at 40%.  She reports normal appetite. She tolerated her last treatment well. She did not have any issues receiving pills for treatment. She notes neuropathy in the feet has remained unchanged. She is taking all medications as prescribed. She notes she has compazine and Imodium pills. She denies any ankle swelling or back pain.  She c/o SOB with exertion. She notes after her last treatment she has been unable to increase energy levels back to normal.   PAST MEDICAL HISTORY:   Past Medical History: Past Medical History:  Diagnosis Date   Acute respiratory disease due to COVID-19 virus 02/02/2019   Anxiety    Cancer (HCC)    Depression    Dyspnea    Dysrhythmia    Family history of bladder cancer    Family history of breast cancer    History of diabetes mellitus    Resolved in Sept 2023 with weight loss.   Hypertension    Ovarian cancer (HCC) 11/14/2021   Peritoneal carcinomatosis (HCC)    Pneumonia    Sleep apnea    doesn't use CPAP   Tachycardia     Surgical History: Past Surgical History:  Procedure Laterality Date    KNEE SURGERY Right    Arthroscopic   LAPAROSCOPIC ABDOMINAL EXPLORATION N/A 11/13/2021   Procedure: Exploratory laparoscopy;  Surgeon: Franky Macho, MD;  Location: [facility];  Service: General;  Laterality: N/A;   PORTACATH PLACEMENT Left 11/15/2021   Procedure: INSERTION PORT-A-CATH;  Surgeon: Franky Macho, MD;  Location: [facility];  Service: General;  Laterality: Left;   TONSILLECTOMY AND ADENOIDECTOMY      Social History: Social History   Socioeconomic History   Marital status: Married    Spouse name: Not on file   Number of children: Not on file   Years of education: Not on file   Highest education level: Not on file  Occupational History   Occupation: Unemployed  Tobacco Use   Smoking status: Never   Smokeless tobacco: Never  Vaping Use   Vaping status: Never Used  Substance and Sexual Activity   Alcohol use: No   Drug use: No   Sexual activity: Not Currently  Other Topics Concern   Not on file  Social History Narrative   Not on file   Social Determinants of Health   Financial Resource Strain: Not on file  Food Insecurity: No Food Insecurity (03/06/2022)   Hunger Vital Sign    Worried About Running Out of Food in the Last Year: Never true    Ran Out of Food in the Last Year: Never true  Transportation Needs: No Transportation Needs (03/06/2022)   PRAPARE - Administrator, Civil Service (Medical): No    Lack of Transportation (Non-Medical): No  Physical Activity: Not on file  Stress: Not on file  Social Connections: Not on file  Intimate Partner Violence: Not At Risk (03/06/2022)   Humiliation, Afraid, Rape, and Kick questionnaire    Fear of Current or Ex-Partner: No    Emotionally Abused: No    Physically Abused: No    Sexually Abused: No    Family History: Family History  Problem Relation Age of Onset   Hypertension Mother    Heart attack Paternal Uncle    Bladder Cancer Maternal Grandmother 38       non-smoker   Dementia Maternal Grandfather     Breast cancer Maternal Great-grandmother        MGF's mother   Colon cancer Neg Hx    Ovarian cancer  Neg Hx    Endometrial cancer Neg Hx    Pancreatic cancer Neg Hx    Prostate cancer Neg Hx     Current Medications:  Current Outpatient Medications:    apixaban (ELIQUIS) 5 MG TABS tablet, Take 1 tablet (5 mg total) by mouth 2 (two) times daily., Disp: 180 tablet, Rfl: 3   atorvastatin (LIPITOR) 20 MG tablet, Take 1 tablet (20 mg total) by mouth at bedtime., Disp: 90 tablet, Rfl: 3   diphenoxylate-atropine (LOMOTIL) 2.5-0.025 MG tablet, Take 2 tablets by mouth after the first watery bowel movement. May then take 1 pill by mouth after each watery bowel movement. Do not exceed 8 pills in a 24 hour period., Disp: 60 tablet, Rfl: 3   DULoxetine (CYMBALTA) 30 MG capsule, TAKE 1 CAPSULE BY MOUTH DAILY FOR 7 DAYS, THEN INCREASE TO 2 CAPSULES DAILY, Disp: 60 capsule, Rfl: 5   lidocaine-prilocaine (EMLA) cream, Apply a quarter sized amount to port a cath site and cover with plastic wrap 1 hour prior to infusion appointments, Disp: 30 g, Rfl: 3   magnesium oxide (MAG-OX) 400 (240 Mg) MG tablet, Take 1 tablet (400 mg total) by mouth in the morning, at noon, and at bedtime., Disp: 90 tablet, Rfl: 2   Multiple Vitamin (MULTIVITAMIN) capsule, Take 1 capsule by mouth daily., Disp: , Rfl:    olaparib (LYNPARZA) 150 MG tablet, Take 2 tablets (300 mg total) by mouth 2 (two) times daily. Swallow whole. May take with food to decrease nausea and vomiting., Disp: 120 tablet, Rfl: 3   Potassium 99 MG TABS, Take 99 mg by mouth at bedtime., Disp: , Rfl:    pregabalin (LYRICA) 100 MG capsule, Take 100 mg by mouth 2 (two) times daily., Disp: , Rfl:    prochlorperazine (COMPAZINE) 10 MG tablet, Take 1 tablet (10 mg total) by mouth every 6 (six) hours as needed for nausea or vomiting., Disp: 30 tablet, Rfl: 3 No current facility-administered medications for this visit.  Facility-Administered Medications Ordered in  Other Visits:    heparin lock flush 100 unit/mL, 500 Units, Intravenous, Once, Nicole Massed, MD   sodium chloride flush (NS) 0.9 % injection 10 mL, 10 mL, Intravenous, PRN, Nicole Massed, MD   sodium chloride flush (NS) 0.9 % injection 10 mL, 10 mL, Intracatheter, PRN, Nicole Massed, MD, 10 mL at 01/07/23 1350   Allergies: Allergies  Allergen Reactions   Augmentin [Amoxicillin-Pot Clavulanate] Diarrhea and Nausea And Vomiting   Doxycycline     Abdominal cramps.   Topamax [Topiramate] Nausea And Vomiting    REVIEW OF SYSTEMS:   Review of Systems  Constitutional:  Negative for chills, fatigue and fever.  HENT:   Negative for lump/mass, mouth sores, nosebleeds, sore throat and trouble swallowing.   Eyes:  Negative for eye problems.  Respiratory:  Positive for shortness of breath (with exertion). Negative for cough.   Cardiovascular:  Negative for chest pain, leg swelling and palpitations.  Gastrointestinal:  Negative for abdominal pain, constipation, diarrhea, nausea and vomiting.  Genitourinary:  Negative for bladder incontinence, difficulty urinating, dysuria, frequency, hematuria and nocturia.   Musculoskeletal:  Negative for arthralgias, back pain, flank pain, myalgias and neck pain.  Skin:  Negative for itching and rash.  Neurological:  Positive for numbness (tingling in feet). Negative for dizziness and headaches.  Hematological:  Does not bruise/bleed easily.  Psychiatric/Behavioral:  Negative for depression, sleep disturbance and suicidal ideas. The patient is not nervous/anxious.   All other systems reviewed and are negative.  VITALS:   Blood pressure 129/81, pulse (!) 101, temperature 98.3 F (36.8 C), temperature source Oral, resp. rate 18, weight 199 lb (90.3 kg), SpO2 99%.  Wt Readings from Last 3 Encounters:  01/07/23 199 lb (90.3 kg)  12/17/22 199 lb 6.4 oz (90.4 kg)  11/26/22 201 lb 6.4 oz (91.4 kg)    Body mass index is 36.16  kg/m.  Performance status (ECOG): 1 - Symptomatic but completely ambulatory  PHYSICAL EXAM:   Physical Exam Vitals and nursing note reviewed. Exam conducted with a chaperone present.  Constitutional:      Appearance: Normal appearance.  Cardiovascular:     Rate and Rhythm: Normal rate and regular rhythm.     Pulses: Normal pulses.     Heart sounds: Normal heart sounds.  Pulmonary:     Effort: Pulmonary effort is normal.     Breath sounds: Normal breath sounds.  Abdominal:     Palpations: Abdomen is soft. There is no hepatomegaly, splenomegaly or mass.     Tenderness: There is no abdominal tenderness.  Musculoskeletal:     Right lower leg: No edema.     Left lower leg: No edema.  Lymphadenopathy:     Cervical: No cervical adenopathy.     Right cervical: No superficial, deep or posterior cervical adenopathy.    Left cervical: No superficial, deep or posterior cervical adenopathy.     Upper Body:     Right upper body: No supraclavicular or axillary adenopathy.     Left upper body: No supraclavicular or axillary adenopathy.  Neurological:     General: No focal deficit present.     Mental Status: She is alert and oriented to person, place, and time.  Psychiatric:        Mood and Affect: Mood normal.        Behavior: Behavior normal.     LABS:      Latest Ref Rng & Units 01/07/2023   10:37 AM 12/17/2022    9:20 AM 11/26/2022    7:59 AM  CBC  WBC 4.0 - 10.5 K/uL 5.4  4.4  5.7   Hemoglobin 12.0 - 15.0 g/dL 9.7  82.9  56.2   Hematocrit 36.0 - 46.0 % 29.7  31.8  35.4   Platelets 150 - 400 K/uL 105  147  153       Latest Ref Rng & Units 01/07/2023   10:35 AM 12/17/2022    9:20 AM 11/26/2022    7:59 AM  CMP  Glucose 70 - 99 mg/dL 130  865  784   BUN 6 - 20 mg/dL 11  9  10    Creatinine 0.44 - 1.00 mg/dL 6.96  2.95  2.84   Sodium 135 - 145 mmol/L 135  138  136   Potassium 3.5 - 5.1 mmol/L 3.8  4.2  4.2   Chloride 98 - 111 mmol/L 105  106  103   CO2 22 - 32 mmol/L 24  26  24     Calcium 8.9 - 10.3 mg/dL 7.9  8.6  8.5   Total Protein 6.5 - 8.1 g/dL 6.5  6.5  6.9   Total Bilirubin 0.3 - 1.2 mg/dL 0.5  0.5  0.8   Alkaline Phos 38 - 126 U/L 58  56  56   AST 15 - 41 U/L 18  16  17    ALT 0 - 44 U/L 15  15  16       Lab Results  Component Value Date  CEA1 1.1 11/13/2021   /  CEA  Date Value Ref Range Status  11/13/2021 1.1 0.0 - 4.7 ng/mL Final    Comment:    (NOTE)                             Nonsmokers          <3.9                             Smokers             <5.6 [employer] Electrochemiluminescence Immunoassay (ECLIA) Values obtained with different assay methods or kits cannot be used interchangeably.  Results cannot be interpreted as absolute evidence of the presence or absence of malignant disease. Performed At: [facility] [address] [place], [place] [id number] Jolene Schimke MD [id number]    No results found for: "PSA1" Lab Results  Component Value Date   CAN199 6 11/13/2021   Lab Results  Component Value Date   CAN125 11.2 11/26/2022    No results found for: "TOTALPROTELP", "ALBUMINELP", "A1GS", "A2GS", "BETS", "BETA2SER", "GAMS", "MSPIKE", "SPEI" Lab Results  Component Value Date   FERRITIN 356 (H) 02/07/2019   FERRITIN 311 (H) 02/06/2019   FERRITIN 343 (H) 02/05/2019   Lab Results  Component Value Date   LDH 259 (H) 02/02/2019     STUDIES:   No results found.

## 2023-01-07 ENCOUNTER — Inpatient Hospital Stay: Payer: Commercial Managed Care - PPO

## 2023-01-07 ENCOUNTER — Inpatient Hospital Stay: Payer: Commercial Managed Care - PPO | Attending: Gynecologic Oncology | Admitting: Hematology

## 2023-01-07 VITALS — BP 129/81 | HR 101 | Temp 98.3°F | Resp 18 | Wt 199.0 lb

## 2023-01-07 DIAGNOSIS — Z7901 Long term (current) use of anticoagulants: Secondary | ICD-10-CM | POA: Diagnosis not present

## 2023-01-07 DIAGNOSIS — G620 Drug-induced polyneuropathy: Secondary | ICD-10-CM | POA: Diagnosis not present

## 2023-01-07 DIAGNOSIS — Z5111 Encounter for antineoplastic chemotherapy: Secondary | ICD-10-CM | POA: Diagnosis present

## 2023-01-07 DIAGNOSIS — C482 Malignant neoplasm of peritoneum, unspecified: Secondary | ICD-10-CM | POA: Diagnosis present

## 2023-01-07 DIAGNOSIS — Z95828 Presence of other vascular implants and grafts: Secondary | ICD-10-CM

## 2023-01-07 DIAGNOSIS — C569 Malignant neoplasm of unspecified ovary: Secondary | ICD-10-CM

## 2023-01-07 DIAGNOSIS — Z9221 Personal history of antineoplastic chemotherapy: Secondary | ICD-10-CM | POA: Diagnosis not present

## 2023-01-07 DIAGNOSIS — Z8541 Personal history of malignant neoplasm of cervix uteri: Secondary | ICD-10-CM | POA: Diagnosis not present

## 2023-01-07 DIAGNOSIS — R197 Diarrhea, unspecified: Secondary | ICD-10-CM | POA: Diagnosis not present

## 2023-01-07 DIAGNOSIS — Z923 Personal history of irradiation: Secondary | ICD-10-CM | POA: Diagnosis not present

## 2023-01-07 DIAGNOSIS — E876 Hypokalemia: Secondary | ICD-10-CM | POA: Diagnosis not present

## 2023-01-07 DIAGNOSIS — I2693 Single subsegmental pulmonary embolism without acute cor pulmonale: Secondary | ICD-10-CM | POA: Insufficient documentation

## 2023-01-07 LAB — CBC WITH DIFFERENTIAL/PLATELET
Abs Immature Granulocytes: 0.03 10*3/uL (ref 0.00–0.07)
Basophils Absolute: 0 10*3/uL (ref 0.0–0.1)
Basophils Relative: 0 %
Eosinophils Absolute: 0.1 10*3/uL (ref 0.0–0.5)
Eosinophils Relative: 1 %
HCT: 29.7 % — ABNORMAL LOW (ref 36.0–46.0)
Hemoglobin: 9.7 g/dL — ABNORMAL LOW (ref 12.0–15.0)
Immature Granulocytes: 1 %
Lymphocytes Relative: 26 %
Lymphs Abs: 1.4 10*3/uL (ref 0.7–4.0)
MCH: 33.1 pg (ref 26.0–34.0)
MCHC: 32.7 g/dL (ref 30.0–36.0)
MCV: 101.4 fL — ABNORMAL HIGH (ref 80.0–100.0)
Monocytes Absolute: 0.5 10*3/uL (ref 0.1–1.0)
Monocytes Relative: 10 %
Neutro Abs: 3.4 10*3/uL (ref 1.7–7.7)
Neutrophils Relative %: 62 %
Platelets: 105 10*3/uL — ABNORMAL LOW (ref 150–400)
RBC: 2.93 MIL/uL — ABNORMAL LOW (ref 3.87–5.11)
RDW: 16.9 % — ABNORMAL HIGH (ref 11.5–15.5)
WBC: 5.4 10*3/uL (ref 4.0–10.5)
nRBC: 0 % (ref 0.0–0.2)

## 2023-01-07 LAB — URINALYSIS, DIPSTICK ONLY
Bilirubin Urine: NEGATIVE
Glucose, UA: NEGATIVE mg/dL
Hgb urine dipstick: NEGATIVE
Ketones, ur: NEGATIVE mg/dL
Leukocytes,Ua: NEGATIVE
Nitrite: NEGATIVE
Protein, ur: NEGATIVE mg/dL
Specific Gravity, Urine: 1.02 (ref 1.005–1.030)
pH: 5 (ref 5.0–8.0)

## 2023-01-07 LAB — COMPREHENSIVE METABOLIC PANEL
ALT: 15 U/L (ref 0–44)
AST: 18 U/L (ref 15–41)
Albumin: 3.2 g/dL — ABNORMAL LOW (ref 3.5–5.0)
Alkaline Phosphatase: 58 U/L (ref 38–126)
Anion gap: 6 (ref 5–15)
BUN: 11 mg/dL (ref 6–20)
CO2: 24 mmol/L (ref 22–32)
Calcium: 7.9 mg/dL — ABNORMAL LOW (ref 8.9–10.3)
Chloride: 105 mmol/L (ref 98–111)
Creatinine, Ser: 0.57 mg/dL (ref 0.44–1.00)
GFR, Estimated: >60 mL/min (ref >60)
Glucose, Bld: 118 mg/dL — ABNORMAL HIGH (ref 70–99)
Potassium: 3.8 mmol/L (ref 3.5–5.1)
Sodium: 135 mmol/L (ref 135–145)
Total Bilirubin: 0.5 mg/dL (ref 0.3–1.2)
Total Protein: 6.5 g/dL (ref 6.5–8.1)

## 2023-01-07 LAB — MAGNESIUM: Magnesium: 1.3 mg/dL — ABNORMAL LOW (ref 1.7–2.4)

## 2023-01-07 MED ORDER — SODIUM CHLORIDE 0.9% FLUSH: 10.0000 mL | INTRAVENOUS | Status: DC | PRN

## 2023-01-07 MED ORDER — SODIUM CHLORIDE 0.9% FLUSH
10.0000 mL | INTRAVENOUS | Status: DC | PRN
  Administered 2023-01-07: 10 mL

## 2023-01-07 MED ORDER — SODIUM CHLORIDE 0.9 % IV SOLN: Freq: Once | INTRAVENOUS | Status: AC

## 2023-01-07 MED ORDER — HEPARIN SOD (PORK) LOCK FLUSH 100 UNIT/ML IV SOLN
500.0000 [IU] | Freq: Once | INTRAVENOUS | Status: AC | PRN
  Administered 2023-01-07: 500 [IU]

## 2023-01-07 MED ORDER — SODIUM CHLORIDE 0.9 % IV SOLN
15.0000 mg/kg | Freq: Once | INTRAVENOUS | Status: AC
  Administered 2023-01-07: 1400 mg via INTRAVENOUS
  Filled 2023-01-07: qty 48

## 2023-01-07 MED ORDER — MAGNESIUM SULFATE 2 GM/50ML IV SOLN
2.0000 g | Freq: Once | INTRAVENOUS | Status: AC
  Administered 2023-01-07: 2 g via INTRAVENOUS
  Filled 2023-01-07: qty 50

## 2023-01-07 NOTE — Patient Instructions (Signed)
[facility]  Discharge Instructions: Thank you for choosing [facility] to provide your oncology and hematology care.  If you have a lab appointment with the Cancer Center - please note that after April 8th, 2024, all labs will be drawn in the cancer center.  You do not have to check in or register with the main entrance as you have in the past but will complete your check-in in the cancer center.  Wear comfortable clothing and clothing appropriate for easy access to any Portacath or PICC line.   We strive to give you quality time with your provider. You may need to reschedule your appointment if you arrive late (15 or more minutes).  Arriving late affects you and other patients whose appointments are after yours.  Also, if you miss three or more appointments without notifying the office, you may be dismissed from the clinic at the provider's discretion.      For prescription refill requests, have your pharmacy contact our office and allow 72 hours for refills to be completed.    Today you received the following chemotherapy and/or immunotherapy agents Avasitn      To help prevent nausea and vomiting after your treatment, we encourage you to take your nausea medication as directed.  BELOW ARE SYMPTOMS THAT SHOULD BE REPORTED IMMEDIATELY: *FEVER GREATER THAN 100.4 F (38 C) OR HIGHER *CHILLS OR SWEATING *NAUSEA AND VOMITING THAT IS NOT CONTROLLED WITH YOUR NAUSEA MEDICATION *UNUSUAL SHORTNESS OF BREATH *UNUSUAL BRUISING OR BLEEDING *URINARY PROBLEMS (pain or burning when urinating, or frequent urination) *BOWEL PROBLEMS (unusual diarrhea, constipation, pain near the anus) TENDERNESS IN MOUTH AND THROAT WITH OR WITHOUT PRESENCE OF ULCERS (sore throat, sores in mouth, or a toothache) UNUSUAL RASH, SWELLING OR PAIN  UNUSUAL VAGINAL DISCHARGE OR ITCHING   Items with * indicate a potential emergency and should be followed up as soon as possible or go to the  Emergency Department if any problems should occur.  Please show the CHEMOTHERAPY ALERT CARD or IMMUNOTHERAPY ALERT CARD at check-in to the Emergency Department and triage nurse.  Should you have questions after your visit or need to cancel or reschedule your appointment, please contact [facility] [phone]  and follow the prompts.  Office hours are 8:00 a.m. to 4:30 p.m. Monday - Friday. Please note that voicemails left after 4:00 p.m. may not be returned until the following business day.  We are closed weekends and major holidays. You have access to a nurse at all times for urgent questions. Please call the main number to the clinic [phone] and follow the prompts.  For any non-urgent questions, you may also contact your provider using MyChart. We now offer e-Visits for anyone 34 and older to request care online for non-urgent symptoms. For details visit [email or web address].   Also download the MyChart app! Go to the app store, search "MyChart", open the app, select [place], and log in with your MyChart username and password.

## 2023-01-07 NOTE — Progress Notes (Signed)
Patient has been assessed, vital signs and labs have been reviewed by Dr. Katragadda. ANC, Creatinine, LFTs, and Platelets are within treatment parameters per Dr. Katragadda. The patient is good to proceed with treatment at this time. Primary RN and pharmacy aware.  

## 2023-01-07 NOTE — Progress Notes (Signed)
Patient presents today for Bevacizumab infusion per providers order.  Vital signs and labs reviewed by MD.  Message received from Kennith Gain RN/Dr. Ellin Saba patient okay for treatment.  Treatment given today per MD orders.  Stable during infusion without adverse affects.  Vital signs stable.  No complaints at this time.  Discharge from clinic ambulatory in stable condition.  Alert and oriented X 3.  Follow up with [facility] as scheduled.

## 2023-01-07 NOTE — Patient Instructions (Addendum)
[facility]  Discharge Instructions  You were seen and examined today by Dr. Ellin Saba.  Your labs are stable.  Proceed with treatment today as planned.  Start Lynparza twice daily. Take it with food to avoid GI upset.  Follow-up as scheduled.  Thank you for choosing [facility] to provide your oncology and hematology care.   To afford each patient quality time with our provider, please arrive at least 15 minutes before your scheduled appointment time. You may need to reschedule your appointment if you arrive late (10 or more minutes). Arriving late affects you and other patients whose appointments are after yours.  Also, if you miss three or more appointments without notifying the office, you may be dismissed from the clinic at the provider's discretion.    Again, thank you for choosing [facility].  Our hope is that these requests will decrease the amount of time that you wait before being seen by our physicians.   If you have a lab appointment with the Cancer Center - please note that after April 8th, all labs will be drawn in the cancer center.  You do not have to check in or register with the main entrance as you have in the past but will complete your check-in at the cancer center.            _____________________________________________________________  Should you have questions after your visit to [facility], please contact our office at [phone] and follow the prompts.  Our office hours are 8:00 a.m. to 4:30 p.m. Monday - Thursday and 8:00 a.m. to 2:30 p.m. Friday.  Please note that voicemails left after 4:00 p.m. may not be returned until the following business day.  We are closed weekends and all major holidays.  You do have access to a nurse 24-7, just call the main number to the clinic [phone] and do not press any options, hold on the line and a nurse will answer the phone.    For  prescription refill requests, have your pharmacy contact our office and allow 72 hours.    Masks are no longer required in the cancer centers. If you would like for your care team to wear a mask while they are taking care of you, please let them know. You may have one support person who is at least 54 years old accompany you for your appointments.

## 2023-01-08 NOTE — Progress Notes (Signed)
24 hour call back: Patient denies any side effects related to new treatment. Patient states she feel good and has no complaints of any side effects.

## 2023-01-16 ENCOUNTER — Other Ambulatory Visit: Payer: Self-pay

## 2023-01-16 DIAGNOSIS — Z1231 Encounter for screening mammogram for malignant neoplasm of breast: Secondary | ICD-10-CM

## 2023-01-27 NOTE — Progress Notes (Signed)
[facility] [address] [name] [address]    Clinic Day:  01/28/2023  Referring physician: [name], DO  Patient Care Team: [name], DO as PCP - General (Family Medicine) [name], MD as Referring Physician (Optometry) [name], MD as Medical Oncologist (Medical Oncology) [name], RN as Oncology Nurse Navigator (Medical Oncology)   ASSESSMENT & PLAN:   Assessment: 1. High-grade serous carcinoma of peritoneum: - She had developed sharp left-sided abdominal pain since Mother's Day, occasional radiating to the back.  40 pound weight loss in the last 6 months. - Personal history of cervical cancer in 2000, status post chemo plus XRT at [facility].  She has slightly decreased hearing in the left ear since then. - US abdomen (11/08/2021): Cholelithiasis with gallbladder wall thickening and sonographic Murphy sign.  Upper abdominal ascites. - Laparoscopy (11/13/2021): Starting of the omentum as well as the upper abdominal wall and diaphragm.  Liver was within normal limits.  6 L of ascites was evacuated.  Omental and peritoneal biopsies were sent.  Peritoneal implants throughout and a significant portion of omentum and on the small intestine. - CT CAP (11/13/2021): Ascites, extensive peritoneal thickening, nodularity and omental caking.  No evidence of primary malignancy in the chest, abdomen or pelvis.  Small left pleural effusion, nonspecific.  Cholelithiasis. - US pelvis (11/14/2021): Uterus is grossly unremarkable.  Ovaries not visualized. - CA125 (11/13/2021): 1269 - Pathology: Peritoneum and omental biopsy consistent with high-grade serous carcinoma.  HER2 2+ by IHC and negative by FISH. - 4 cycles of carboplatin and paclitaxel from 11/21/2021 through 01/23/2022, cycle 9 on 08/29/2022. - 10/01/2022: Diagnostic laparoscopy, exploratory laparotomy, omentectomy - Pathology: Right pelvic sidewall excision-high-grade  serous carcinoma, omental resection-high-grade serous carcinoma with treatment effect present.  Small bowel implant excision was negative for carcinoma. - Germline mutation testing negative - Foundation 1 NGS (10/01/2022): LOH-18.9%, HRD +, MS-stable, TMB-low - Olaparib 300 mg twice daily and bevacizumab maintenance started on 01/07/2023  2. Social/family history: - Lives at home with her husband.  Worked in Engineering geologist at [employer].  Non-smoker. - Maternal grandmother with bladder cancer.  Maternal great grandmother with breast cancer.    Plan: High-grade serous carcinoma of peritoneum: - She is tolerating Lynparza 300 mg twice daily reasonably well.  She started it on 01/07/2023. - She reports some easy tiredness and legs getting weak. - Labs today: Creatinine 0.76.  LFTs are normal.  CBC shows stable hemoglobin 9.6.  White count is normal and platelet count mildly low and stable. - CA125 was 8.9 on 01/07/2023. - Continue Lynparza 300 mg twice daily.  Proceed with bevacizumab today.  RTC 3 weeks for follow-up.   2.  Peripheral neuropathy: - Continue Cymbalta 60 mg daily.  Numbness in the toes has been stable.  3.  Hypomagnesemia: - Continue magnesium 3 times daily.  Magnesium is 1.6 today.  She will receive IV magnesium.  4.  Hypokalemia: - Continue potassium 20 mEq daily.  Potassium is normal.   5.  Subsegmental pulmonary embolism (Dx 03/06/2022): - Continue Eliquis twice daily.  No bleeding issues.      Orders Placed This Encounter  Procedures   CA 125    Standing Status:   Standing    Number of Occurrences:   10    Standing Expiration Date:   01/28/2024      I,Katie Daubenspeck,acting as a scribe for [name], MD.,have documented all relevant documentation on the behalf  of [name], MD,as directed by  [name], MD while in the presence of [name], MD.   I, [name] MD, have reviewed the above documentation for accuracy and  completeness, and I agree with the above.   [name], MD   8/26/20245:18 PM  CHIEF COMPLAINT:   Diagnosis: high-grade serous carcinoma of peritoneum    Cancer Staging  Primary peritoneal carcinomatosis [facility]) Staging form: Ovary, Fallopian Tube, and Primary Peritoneal Carcinoma, AJCC 8th Edition - Clinical stage from 11/17/2021: FIGO Stage IIIC (cT3c, cM0) - Unsigned    Prior Therapy: Carboplatin and paclitaxel-- x 4 cycles 11/21/2021 - 01/23/2022, x5 cycles 06/05/22 - 08/29/22, x3 cycles 11/05/22 - 12/17/22  Current Therapy:  olaparib and maintenance bevacizumab    HISTORY OF PRESENT ILLNESS:   Oncology History  Malignant neoplasm of ovary (HCC)  11/15/2021 Initial Diagnosis   Malignant neoplasm of ovary (HCC)   01/07/2023 -  Chemotherapy   Patient is on Treatment Plan : OVARIAN Bevacizumab q21d     Primary peritoneal carcinomatosis (HCC)  11/13/2021 Imaging   CT A/P: 1. Trace ascites throughout the abdomen and pelvis, with extensive peritoneal thickening, nodularity, and omental caking. Findings are consistent with widespread peritoneal and omental metastatic disease. 2. No evidence of primary malignancy in the chest, abdomen, or pelvis, with specific attention to the most likely etiologies of peritoneal metastatic disease including pancreatic, ovarian, and endometrial malignancy. Although there is no obvious mass, pelvic ultrasound may be helpful to more closely assess the female reproductive organs. 3. Small left pleural effusion and associated atelectasis or consolidation, nonspecific although worrisome for malignant effusion. No overt pleural thickening or nodularity. 4. Scattered areas of nonspecific infectious or inflammatory alveolar ground-glass airspace opacity throughout the lungs. 5. Cholelithiasis without evidence of acute cholecystitis. 6. Nonobstructive right nephrolithiasis. 7. Postoperative findings of same day laparoscopy including  minimal pneumoperitoneum.   Aortic Atherosclerosis (ICD10-I70.0).   11/13/2021 Surgery   Diagnostic laparoscopy with biopsy Dr. Lovell Sheehan Findings: Significant bloody ascites, studding of the omentum as well as the upper abdominal wall and diaphragm.  Nodularity noted along the small intestine.  Omental disease inhibited view of the pelvis.   11/17/2021 Initial Diagnosis   Primary peritoneal carcinomatosis (HCC)   11/21/2021 - 01/25/2022 Chemotherapy   Patient is on Treatment Plan : OVARIAN Carboplatin (AUC 6) / Paclitaxel (175) q21d x 6 cycles     11/21/2021 - 12/17/2022 Chemotherapy   Patient is on Treatment Plan : OVARIAN Carboplatin (AUC 6) + Paclitaxel (175)  + Mvasi q21d X 6 Cycles     01/19/2022 Imaging   CT A/P: 1. Small amount of adherent thrombus or fibrin sheath around the catheter in the LEFT brachiocephalic vein. 2. Small low-attenuation area in the inter lobar fissure region associated with medial segment of LEFT hepatic lobe, new from previous imaging, may represent developing "variable fat deposition" though given history of peritoneal disease would suggest attention on subsequent imaging. 3. Diminished thickening of mesenteric reflections, decreased ascites and decreased omental infiltration since previous imaging. 4. Focal concentric narrowing of the rectosigmoid junction is nonspecific but suspicious for neoplasm potentially related to serosal involvement in the pelvis in this patient with known peritoneal disease from ovarian cancer. Underlying colonic neoplasm would be difficult to exclude as well. Colonic assessment with endoscopy or comparison with recent endoscopy performed is suggested. 5. Cholelithiasis without evidence of acute cholecystitis. 6. 4 mm RIGHT lower pole renal calculus.   Aortic Atherosclerosis (ICD10-I70.0).   03/06/2022 Imaging   CT C/A/P: Chest  CT:   Scattered subsegmental pulmonary emboli in the right lung.   Abdominal CT:   1. Mild  right hydroureteronephrosis from a 4 mm UVJ calculus. 2. Unchanged omental disease. 3. Cholelithiasis and other chronic findings described above.   05/24/2022 Imaging   Increased peritoneal carcinomatosis and mild ascites.   New moderate distal small bowel obstruction, with transition point in the central pelvis adjacent to the uterine fundus, which may be due to peritoneal carcinomatosis or adhesion.   Cholelithiasis. No radiographic evidence of cholecystitis.   Aortic Atherosclerosis (ICD10-I70.0).   08/21/2022 Imaging   CT A/P: 1. Improved peritoneal carcinomatosis and mild ascites. 2. Prominent/mildly enlarged loops of small bowel throughout the abdomen with transition point in the pelvis, not significantly changed from prior and compatible with a least low-grade obstruction. 3. Cholelithiasis without findings of acute cholecystitis.   10/01/2022 Surgery   Diagnostic laparoscopy, robotic assisted laparoscopic lysis of adhesions, exploratory laparotomy, omentectomy   Findings: On EUA, small uterus within minimal mobility. On intra-abdominal entry, omentum adherent to the anterior abdominal wall at the level of the transverse colon. Some adhesions noted between the anterior liver surface and the diaphragm on the right. Small miliary disease noted on bilateral diaphragms, small bowel, small bowel mesentery, abdominal and pelvic peritoneum. No ascites. Complete retroperitonealization of the pelvic organs. Multiple loops of small bowel were densely adherent to the uterine fundus without obvious obstruction. Terminal ileum and cecum also adherent along the right pelvic sidewall. Sigmoid colon densely adherent to a loop of ileum and to the lateral aspect of the retroperitonealized pelvis on the left. After opening the retroperitoneum bilaterally and transecting the round ligaments, the bladder flap was able to be partially developed anteriorly. This revealed dense adhesions of the sigmoid to  the lateral uterus. Given prior discussions with the patient, I did not feel that proceeding with further attempt at lysis of adhesions (some mobilization of two loops of ileum adherent to the uterine fundus was performed) would be possible without significant risk of injury to multiple loops of small bowel and to the sigmoid colon. 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 On laparotomy, 8 x 4 cm omental cake noted. No disease visualized or palpated within the lesser sac. There was no palpable disease along the liver edges or spleen. Stomach was normal in appearance. Some miliary disease palpated along the abdominal peritoneum. Numerous miliary disease noted along the small bowel and small bowel mesentery. Numerous prominent lymph nodes (1cm or less) within the stalk of the small bowel mesentery.  R1 resection based on miliary disease (in the setting of incomplete pelvic evaluation).   10/01/2022 Pathology Results   A. Pelvic sidewall, right, excision - High-grade serous carcinoma   B. Omentum, resection - High-grade serous carcinoma - Treatment effect present - See comment   C. Small bowel, implant, excision - Infarcted epiploic appendage - Negative for carcinoma  Immunohistochemical stains were performed on block B1. The tumor cells are positive for WT1, compatible with tubo-ovarian or peritoneal origin. p53 demonstrates faint positivity in rare cells, favored to be most consistent with a null pattern.    11/05/2022 Genetic Testing   Negative genetic testing on the [facility] Multi-cancer + RNA panel.  The report date is November 05, 2022.  The Multi-Cancer + RNA Panel offered by [facility] includes sequencing and/or deletion/duplication analysis of the following 70 genes:  AIP*, ALK, APC*, ATM*, AXIN2*, BAP1*, BARD1*, BLM*, BMPR1A*, BRCA1*, BRCA2*, BRIP1*, CDC73*, CDH1*, CDK4, CDKN1B*, CDKN2A, CHEK2*,  CTNNA1*, DICER1*, EPCAM (del/dup only), EGFR, FH*,  FLCN*, GREM1 (promoter dup only), HOXB13, KIT, LZTR1, MAX*, MBD4, MEN1*, MET, MITF, MLH1*, MSH2*, MSH3*, MSH6*, MUTYH*, NF1*, NF2*, NTHL1*, PALB2*, PDGFRA, PMS2*, POLD1*, POLE*, POT1*, PRKAR1A*, PTCH1*, PTEN*, RAD51C*, RAD51D*, RB1*, RET, SDHA* (sequencing only), SDHAF2*, SDHB*, SDHC*, SDHD*, SMAD4*, SMARCA4*, SMARCB1*, SMARCE1*, STK11*, SUFU*, TMEM127*, TP53*, TSC1*, TSC2*, VHL*. RNA analysis is performed for * genes.    01/07/2023 -  Chemotherapy   Patient is on Treatment Plan : OVARIAN Bevacizumab q21d        INTERVAL HISTORY:   Nicole Santos is a 52 y.o. female presenting to clinic today for follow up of high-grade serous carcinoma of peritoneum. She was last seen by me on 01/07/23.  Today, she states that she is doing well overall. Her appetite level is at 100%. Her energy level is at 60%.  PAST MEDICAL HISTORY:   Past Medical History: Past Medical History:  Diagnosis Date   Acute respiratory disease due to COVID-19 virus 02/02/2019   Anxiety    Cancer (HCC)    Depression    Dyspnea    Dysrhythmia    Family history of bladder cancer    Family history of breast cancer    History of diabetes mellitus    Resolved in Sept 2023 with weight loss.   Hypertension    Ovarian cancer (HCC) 11/14/2021   Peritoneal carcinomatosis (HCC)    Pneumonia    Sleep apnea    doesn't use CPAP   Tachycardia     Surgical History: Past Surgical History:  Procedure Laterality Date   KNEE SURGERY Right    Arthroscopic   LAPAROSCOPIC ABDOMINAL EXPLORATION N/A 11/13/2021   Procedure: Exploratory laparoscopy;  Surgeon: Franky Macho, MD;  Location: [facility];  Service: General;  Laterality: N/A;   PORTACATH PLACEMENT Left 11/15/2021   Procedure: INSERTION PORT-A-CATH;  Surgeon: Franky Macho, MD;  Location: [facility];  Service: General;  Laterality: Left;   TONSILLECTOMY AND ADENOIDECTOMY      Social History: Social History   Socioeconomic History   Marital status: Married    Spouse name: Not on file   Number  of children: Not on file   Years of education: Not on file   Highest education level: Not on file  Occupational History   Occupation: Unemployed  Tobacco Use   Smoking status: Never   Smokeless tobacco: Never  Vaping Use   Vaping status: Never Used  Substance and Sexual Activity   Alcohol use: No   Drug use: No   Sexual activity: Not Currently  Other Topics Concern   Not on file  Social History Narrative   Not on file   Social Determinants of Health   Financial Resource Strain: Not on file  Food Insecurity: No Food Insecurity (03/06/2022)   Hunger Vital Sign    Worried About Running Out of Food in the Last Year: Never true    Ran Out of Food in the Last Year: Never true  Transportation Needs: No Transportation Needs (03/06/2022)   PRAPARE - Administrator, Civil Service (Medical): No    Lack of Transportation (Non-Medical): No  Physical Activity: Not on file  Stress: Not on file  Social Connections: Not on file  Intimate Partner Violence: Not At Risk (03/06/2022)   Humiliation, Afraid, Rape, and Kick questionnaire    Fear of Current or Ex-Partner: No    Emotionally Abused: No    Physically Abused: No    Sexually Abused: No  Family History: Family History  Problem Relation Age of Onset   Hypertension Mother    Heart attack Paternal Uncle    Bladder Cancer Maternal Grandmother 50       non-smoker   Dementia Maternal Grandfather    Breast cancer Maternal Great-grandmother        MGF's mother   Colon cancer Neg Hx    Ovarian cancer Neg Hx    Endometrial cancer Neg Hx    Pancreatic cancer Neg Hx    Prostate cancer Neg Hx     Current Medications:  Current Outpatient Medications:    apixaban (ELIQUIS) 5 MG TABS tablet, Take 1 tablet (5 mg total) by mouth 2 (two) times daily., Disp: 180 tablet, Rfl: 3   atorvastatin (LIPITOR) 20 MG tablet, Take 1 tablet (20 mg total) by mouth at bedtime., Disp: 90 tablet, Rfl: 3   diphenoxylate-atropine (LOMOTIL)  2.5-0.025 MG tablet, Take 2 tablets by mouth after the first watery bowel movement. May then take 1 pill by mouth after each watery bowel movement. Do not exceed 8 pills in a 24 hour period., Disp: 60 tablet, Rfl: 3   DULoxetine (CYMBALTA) 30 MG capsule, TAKE 1 CAPSULE BY MOUTH DAILY FOR 7 DAYS, THEN INCREASE TO 2 CAPSULES DAILY, Disp: 60 capsule, Rfl: 5   lidocaine-prilocaine (EMLA) cream, Apply a quarter sized amount to port a cath site and cover with plastic wrap 1 hour prior to infusion appointments, Disp: 30 g, Rfl: 3   magnesium oxide (MAG-OX) 400 (240 Mg) MG tablet, Take 1 tablet (400 mg total) by mouth in the morning, at noon, and at bedtime., Disp: 90 tablet, Rfl: 2   Multiple Vitamin (MULTIVITAMIN) capsule, Take 1 capsule by mouth daily., Disp: , Rfl:    olaparib (LYNPARZA) 150 MG tablet, Take 2 tablets (300 mg total) by mouth 2 (two) times daily. Swallow whole. May take with food to decrease nausea and vomiting., Disp: 120 tablet, Rfl: 3   Potassium 99 MG TABS, Take 99 mg by mouth at bedtime., Disp: , Rfl:    pregabalin (LYRICA) 100 MG capsule, Take 100 mg by mouth 2 (two) times daily., Disp: , Rfl:    prochlorperazine (COMPAZINE) 10 MG tablet, Take 1 tablet (10 mg total) by mouth every 6 (six) hours as needed for nausea or vomiting., Disp: 30 tablet, Rfl: 3 No current facility-administered medications for this visit.  Facility-Administered Medications Ordered in Other Visits:    heparin lock flush 100 unit/mL, 500 Units, Intravenous, Once, [name], MD   sodium chloride flush (NS) 0.9 % injection 10 mL, 10 mL, Intravenous, PRN, [name], MD   sodium chloride flush (NS) 0.9 % injection 10 mL, 10 mL, Intracatheter, PRN, [name], MD, 10 mL at 01/28/23 1237   Allergies: Allergies  Allergen Reactions   Augmentin [Amoxicillin-Pot Clavulanate] Diarrhea and Nausea And Vomiting   Doxycycline     Abdominal cramps.   Topamax [Topiramate] Nausea And Vomiting     REVIEW OF SYSTEMS:   Review of Systems  Constitutional:  Positive for fatigue. Negative for chills and fever.  HENT:   Negative for lump/mass, mouth sores, nosebleeds, sore throat and trouble swallowing.   Eyes:  Negative for eye problems.  Respiratory:  Negative for cough and shortness of breath.   Cardiovascular:  Negative for chest pain, leg swelling and palpitations.  Gastrointestinal:  Negative for abdominal pain, constipation, diarrhea, nausea and vomiting.  Genitourinary:  Negative for bladder incontinence, difficulty urinating, dysuria, frequency, hematuria and nocturia.  Musculoskeletal:  Negative for arthralgias, back pain, flank pain, myalgias and neck pain.  Skin:  Negative for itching and rash.  Neurological:  Positive for numbness. Negative for dizziness and headaches.  Hematological:  Does not bruise/bleed easily.  Psychiatric/Behavioral:  Negative for depression, sleep disturbance and suicidal ideas. The patient is not nervous/anxious.   All other systems reviewed and are negative.    VITALS:   There were no vitals taken for this visit.  Wt Readings from Last 3 Encounters:  01/28/23 200 lb 6.4 oz (90.9 kg)  01/07/23 199 lb (90.3 kg)  12/17/22 199 lb 6.4 oz (90.4 kg)    There is no height or weight on file to calculate BMI.  Performance status (ECOG): 1 - Symptomatic but completely ambulatory  PHYSICAL EXAM:   Physical Exam Vitals and nursing note reviewed. Exam conducted with a chaperone present.  Constitutional:      Appearance: Normal appearance.  Cardiovascular:     Rate and Rhythm: Normal rate and regular rhythm.     Pulses: Normal pulses.     Heart sounds: Normal heart sounds.  Pulmonary:     Effort: Pulmonary effort is normal.     Breath sounds: Normal breath sounds.  Abdominal:     Palpations: Abdomen is soft. There is no hepatomegaly, splenomegaly or mass.     Tenderness: There is no abdominal tenderness.  Musculoskeletal:     Right lower  leg: No edema.     Left lower leg: No edema.  Lymphadenopathy:     Cervical: No cervical adenopathy.     Right cervical: No superficial, deep or posterior cervical adenopathy.    Left cervical: No superficial, deep or posterior cervical adenopathy.     Upper Body:     Right upper body: No supraclavicular or axillary adenopathy.     Left upper body: No supraclavicular or axillary adenopathy.  Neurological:     General: No focal deficit present.     Mental Status: She is alert and oriented to person, place, and time.  Psychiatric:        Mood and Affect: Mood normal.        Behavior: Behavior normal.     LABS:      Latest Ref Rng & Units 01/28/2023    9:48 AM 01/07/2023   10:37 AM 12/17/2022    9:20 AM  CBC  WBC 4.0 - 10.5 K/uL 4.9  5.4  4.4   Hemoglobin 12.0 - 15.0 g/dL 9.6  9.7  40.9   Hematocrit 36.0 - 46.0 % 28.5  29.7  31.8   Platelets 150 - 400 K/uL 127  105  147       Latest Ref Rng & Units 01/28/2023    9:48 AM 01/07/2023   10:35 AM 12/17/2022    9:20 AM  CMP  Glucose 70 - 99 mg/dL 811  914  782   BUN 6 - 20 mg/dL 15  11  9    Creatinine 0.44 - 1.00 mg/dL 9.56  2.13  0.86   Sodium 135 - 145 mmol/L 137  135  138   Potassium 3.5 - 5.1 mmol/L 3.5  3.8  4.2   Chloride 98 - 111 mmol/L 103  105  106   CO2 22 - 32 mmol/L 24  24  26    Calcium 8.9 - 10.3 mg/dL 8.4  7.9  8.6   Total Protein 6.5 - 8.1 g/dL 6.6  6.5  6.5   Total Bilirubin 0.3 - 1.2 mg/dL  0.8  0.5  0.5   Alkaline Phos 38 - 126 U/L 54  58  56   AST 15 - 41 U/L 18  18  16    ALT 0 - 44 U/L 13  15  15       Lab Results  Component Value Date   CEA1 1.1 11/13/2021   /  CEA  Date Value Ref Range Status  11/13/2021 1.1 0.0 - 4.7 ng/mL Final    Comment:    (NOTE)                             Nonsmokers          <3.9                             Smokers             <5.6 [employer] Electrochemiluminescence Immunoassay (ECLIA) Values obtained with different assay methods or kits cannot be used  interchangeably.  Results cannot be interpreted as absolute evidence of the presence or absence of malignant disease. Performed At: [facility] [address] [place], [name] [id number] Jolene Schimke MD YN:[id number]    No results found for: "PSA1" Lab Results  Component Value Date   CAN199 6 11/13/2021   Lab Results  Component Value Date   CAN125 8.9 01/07/2023    No results found for: "TOTALPROTELP", "ALBUMINELP", "A1GS", "A2GS", "BETS", "BETA2SER", "GAMS", "MSPIKE", "SPEI" Lab Results  Component Value Date   FERRITIN 356 (H) 02/07/2019   FERRITIN 311 (H) 02/06/2019   FERRITIN 343 (H) 02/05/2019   Lab Results  Component Value Date   LDH 259 (H) 02/02/2019     STUDIES:   No results found.

## 2023-01-28 ENCOUNTER — Inpatient Hospital Stay: Payer: Commercial Managed Care - PPO

## 2023-01-28 ENCOUNTER — Inpatient Hospital Stay: Payer: Commercial Managed Care - PPO | Admitting: Hematology

## 2023-01-28 VITALS — BP 137/70 | HR 118 | Temp 98.7°F | Resp 19 | Ht 62.0 in | Wt 200.4 lb

## 2023-01-28 VITALS — BP 115/74 | HR 80 | Temp 98.1°F | Resp 18

## 2023-01-28 DIAGNOSIS — Z95828 Presence of other vascular implants and grafts: Secondary | ICD-10-CM

## 2023-01-28 DIAGNOSIS — C569 Malignant neoplasm of unspecified ovary: Secondary | ICD-10-CM | POA: Diagnosis not present

## 2023-01-28 DIAGNOSIS — C482 Malignant neoplasm of peritoneum, unspecified: Secondary | ICD-10-CM

## 2023-01-28 DIAGNOSIS — Z5111 Encounter for antineoplastic chemotherapy: Secondary | ICD-10-CM | POA: Diagnosis not present

## 2023-01-28 LAB — CBC WITH DIFFERENTIAL/PLATELET
Abs Immature Granulocytes: 0.02 10*3/uL (ref 0.00–0.07)
Basophils Absolute: 0 10*3/uL (ref 0.0–0.1)
Basophils Relative: 0 %
Eosinophils Absolute: 0.1 10*3/uL (ref 0.0–0.5)
Eosinophils Relative: 3 %
HCT: 28.5 % — ABNORMAL LOW (ref 36.0–46.0)
Hemoglobin: 9.6 g/dL — ABNORMAL LOW (ref 12.0–15.0)
Immature Granulocytes: 0 %
Lymphocytes Relative: 27 %
Lymphs Abs: 1.3 10*3/uL (ref 0.7–4.0)
MCH: 34.5 pg — ABNORMAL HIGH (ref 26.0–34.0)
MCHC: 33.7 g/dL (ref 30.0–36.0)
MCV: 102.5 fL — ABNORMAL HIGH (ref 80.0–100.0)
Monocytes Absolute: 0.3 10*3/uL (ref 0.1–1.0)
Monocytes Relative: 7 %
Neutro Abs: 3.1 10*3/uL (ref 1.7–7.7)
Neutrophils Relative %: 63 %
Platelets: 127 10*3/uL — ABNORMAL LOW (ref 150–400)
RBC: 2.78 MIL/uL — ABNORMAL LOW (ref 3.87–5.11)
RDW: 17.4 % — ABNORMAL HIGH (ref 11.5–15.5)
WBC: 4.9 10*3/uL (ref 4.0–10.5)
nRBC: 0.8 % — ABNORMAL HIGH (ref 0.0–0.2)

## 2023-01-28 LAB — COMPREHENSIVE METABOLIC PANEL
ALT: 13 U/L (ref 0–44)
AST: 18 U/L (ref 15–41)
Albumin: 3.2 g/dL — ABNORMAL LOW (ref 3.5–5.0)
Alkaline Phosphatase: 54 U/L (ref 38–126)
Anion gap: 10 (ref 5–15)
BUN: 15 mg/dL (ref 6–20)
CO2: 24 mmol/L (ref 22–32)
Calcium: 8.4 mg/dL — ABNORMAL LOW (ref 8.9–10.3)
Chloride: 103 mmol/L (ref 98–111)
Creatinine, Ser: 0.76 mg/dL (ref 0.44–1.00)
GFR, Estimated: >60 mL/min (ref >60)
Glucose, Bld: 150 mg/dL — ABNORMAL HIGH (ref 70–99)
Potassium: 3.5 mmol/L (ref 3.5–5.1)
Sodium: 137 mmol/L (ref 135–145)
Total Bilirubin: 0.8 mg/dL (ref 0.3–1.2)
Total Protein: 6.6 g/dL (ref 6.5–8.1)

## 2023-01-28 LAB — MAGNESIUM: Magnesium: 1.6 mg/dL — ABNORMAL LOW (ref 1.7–2.4)

## 2023-01-28 MED ORDER — SODIUM CHLORIDE 0.9 % IV SOLN
15.0000 mg/kg | Freq: Once | INTRAVENOUS | Status: AC
  Administered 2023-01-28: 1400 mg via INTRAVENOUS
  Filled 2023-01-28: qty 48

## 2023-01-28 MED ORDER — MAGNESIUM SULFATE 2 GM/50ML IV SOLN
2.0000 g | Freq: Once | INTRAVENOUS | Status: AC
  Administered 2023-01-28: 2 g via INTRAVENOUS
  Filled 2023-01-28: qty 50

## 2023-01-28 MED ORDER — SODIUM CHLORIDE 0.9% FLUSH
10.0000 mL | INTRAVENOUS | Status: DC | PRN
  Administered 2023-01-28: 10 mL

## 2023-01-28 MED ORDER — HEPARIN SOD (PORK) LOCK FLUSH 100 UNIT/ML IV SOLN
500.0000 [IU] | Freq: Once | INTRAVENOUS | Status: AC | PRN
  Administered 2023-01-28: 500 [IU]

## 2023-01-28 MED ORDER — SODIUM CHLORIDE 0.9% FLUSH: 10.0000 mL | INTRAVENOUS | Status: DC | PRN

## 2023-01-28 MED ORDER — SODIUM CHLORIDE 0.9% FLUSH
10.0000 mL | INTRAVENOUS | Status: DC | PRN
  Administered 2023-01-28: 10 mL via INTRAVENOUS

## 2023-01-28 MED ORDER — SODIUM CHLORIDE 0.9 % IV SOLN: Freq: Once | INTRAVENOUS | Status: AC

## 2023-01-28 NOTE — Progress Notes (Signed)
Patients port flushed without difficulty.  Good blood return noted with no bruising or swelling noted at site.  VSS. Patient remains accessed for treatment.  

## 2023-01-28 NOTE — Progress Notes (Signed)
Patient presents today for Avastin infusion. Patient is in satisfactory condition with no new complaints voiced.  Vital signs are stable.  Labs reviewed by Dr. Ellin Saba during the office visit and all labs are within treatment parameters. Pt's magnesium is 1.6 today, pt will receive 2g IV per D.K's standing orders. We will proceed with treatment per MD orders.   Treatment given today per MD orders. Tolerated infusion without adverse affects. Vital signs stable. No complaints at this time. Discharged from clinic ambulatory in stable condition. Alert and oriented x 3. F/U with [facility] as scheduled.

## 2023-01-28 NOTE — Progress Notes (Signed)
Patient is taking Lynparza as prescribed.  She has not missed any doses and reports no side effects at this time.   

## 2023-01-28 NOTE — Patient Instructions (Signed)
 [facility]  Discharge Instructions: Thank you for choosing [facility] to provide your oncology and hematology care.  If you have a lab appointment with the Cancer Center - please note that after April 8th, 2024, all labs will be drawn in the cancer center.  You do not have to check in or register with the main entrance as you have in the past but will complete your check-in in the cancer center.  Wear comfortable clothing and clothing appropriate for easy access to any Portacath or PICC line.   We strive to give you quality time with your provider. You may need to reschedule your appointment if you arrive late (15 or more minutes).  Arriving late affects you and other patients whose appointments are after yours.  Also, if you miss three or more appointments without notifying the office, you may be dismissed from the clinic at the provider's discretion.      For prescription refill requests, have your pharmacy contact our office and allow 72 hours for refills to be completed.    Today you received the following chemotherapy and/or immunotherapy agents Avastin   To help prevent nausea and vomiting after your treatment, we encourage you to take your nausea medication as directed.  Bevacizumab Injection What is this medication? BEVACIZUMAB (be va SIZ yoo mab) treats some types of cancer. It works by blocking a protein that causes cancer cells to grow and multiply. This helps to slow or stop the spread of cancer cells. It is a monoclonal antibody. This medicine may be used for other purposes; ask your health care provider or pharmacist if you have questions. COMMON BRAND NAME(S): Alymsys, Avastin, MVASI, Omer Jack What should I tell my care team before I take this medication? They need to know if you have any of these conditions: Blood clots Coughing up blood Having or recent surgery Heart failure High blood pressure History of a connection between 2 or more  body parts that do not usually connect (fistula) History of a tear in your stomach or intestines Protein in your urine An unusual or allergic reaction to bevacizumab, other medications, foods, dyes, or preservatives Pregnant or trying to get pregnant Breast-feeding How should I use this medication? This medication is injected into a vein. It is given by your care team in a hospital or clinic setting. Talk to your care team the use of this medication in children. Special care may be needed. Overdosage: If you think you have taken too much of this medicine contact a poison control center or emergency room at once. NOTE: This medicine is only for you. Do not share this medicine with others. What if I miss a dose? Keep appointments for follow-up doses. It is important not to miss your dose. Call your care team if you are unable to keep an appointment. What may interact with this medication? Interactions are not expected. This list may not describe all possible interactions. Give your health care provider a list of all the medicines, herbs, non-prescription drugs, or dietary supplements you use. Also tell them if you smoke, drink alcohol, or use illegal drugs. Some items may interact with your medicine. What should I watch for while using this medication? Your condition will be monitored carefully while you are receiving this medication. You may need blood work while taking this medication. This medication may make you feel generally unwell. This is not uncommon as chemotherapy can affect healthy cells as well as cancer cells. Report any  side effects. Continue your course of treatment even though you feel ill unless your care team tells you to stop. This medication may increase your risk to bruise or bleed. Call your care team if you notice any unusual bleeding. Before having surgery, talk to your care team to make sure it is ok. This medication can increase the risk of poor healing of your surgical  site or wound. You will need to stop this medication for 28 days before surgery. After surgery, wait at least 28 days before restarting this medication. Make sure the surgical site or wound is healed enough before restarting this medication. Talk to your care team if questions. Talk to your care team if you may be pregnant. Serious birth defects can occur if you take this medication during pregnancy and for 6 months after the last dose. Contraception is recommended while taking this medication and for 6 months after the last dose. Your care team can help you find the option that works for you. Do not breastfeed while taking this medication and for 6 months after the last dose. This medication can cause infertility. Talk to your care team if you are concerned about your fertility. What side effects may I notice from receiving this medication? Side effects that you should report to your care team as soon as possible: Allergic reactions--skin rash, itching, hives, swelling of the face, lips, tongue, or throat Bleeding--bloody or black, tar-like stools, vomiting blood or brown material that looks like coffee grounds, red or dark brown urine, small red or purple spots on skin, unusual bruising or bleeding Blood clot--pain, swelling, or warmth in the leg, shortness of breath, chest pain Heart attack--pain or tightness in the chest, shoulders, arms, or jaw, nausea, shortness of breath, cold or clammy skin, feeling faint or lightheaded Heart failure--shortness of breath, swelling of the ankles, feet, or hands, sudden weight gain, unusual weakness or fatigue Increase in blood pressure Infection--fever, chills, cough, sore throat, wounds that don't heal, pain or trouble when passing urine, general feeling of discomfort or being unwell Infusion reactions--chest pain, shortness of breath or trouble breathing, feeling faint or lightheaded Kidney injury--decrease in the amount of urine, swelling of the ankles,  hands, or feet Stomach pain that is severe, does not go away, or gets worse Stroke--sudden numbness or weakness of the face, arm, or leg, trouble speaking, confusion, trouble walking, loss of balance or coordination, dizziness, severe headache, change in vision Sudden and severe headache, confusion, change in vision, seizures, which may be signs of posterior reversible encephalopathy syndrome (PRES) Side effects that usually do not require medical attention (report to your care team if they continue or are bothersome): Back pain Change in taste Diarrhea Dry skin Increased tears Nosebleed This list may not describe all possible side effects. Call your doctor for medical advice about side effects. You may report side effects to FDA at 1-800-FDA-1088. Where should I keep my medication? This medication is given in a hospital or clinic. It will not be stored at home. NOTE: This sheet is a summary. It may not cover all possible information. If you have questions about this medicine, talk to your doctor, pharmacist, or health care provider.  2024 Elsevier/Gold Standard (2021-10-06 00:00:00)   BELOW ARE SYMPTOMS THAT SHOULD BE REPORTED IMMEDIATELY: *FEVER GREATER THAN 100.4 F (38 C) OR HIGHER *CHILLS OR SWEATING *NAUSEA AND VOMITING THAT IS NOT CONTROLLED WITH YOUR NAUSEA MEDICATION *UNUSUAL SHORTNESS OF BREATH *UNUSUAL BRUISING OR BLEEDING *URINARY PROBLEMS (pain or burning when urinating,  or frequent urination) *BOWEL PROBLEMS (unusual diarrhea, constipation, pain near the anus) TENDERNESS IN MOUTH AND THROAT WITH OR WITHOUT PRESENCE OF ULCERS (sore throat, sores in mouth, or a toothache) UNUSUAL RASH, SWELLING OR PAIN  UNUSUAL VAGINAL DISCHARGE OR ITCHING   Items with * indicate a potential emergency and should be followed up as soon as possible or go to the Emergency Department if any problems should occur.  Please show the CHEMOTHERAPY ALERT CARD or IMMUNOTHERAPY ALERT CARD at check-in  to the Emergency Department and triage nurse.  Should you have questions after your visit or need to cancel or reschedule your appointment, please contact [facility] [phone]  and follow the prompts.  Office hours are 8:00 a.m. to 4:30 p.m. Monday - Friday. Please note that voicemails left after 4:00 p.m. may not be returned until the following business day.  We are closed weekends and major holidays. You have access to a nurse at all times for urgent questions. Please call the main number to the clinic [phone] and follow the prompts.  For any non-urgent questions, you may also contact your provider using MyChart. We now offer e-Visits for anyone 66 and older to request care online for non-urgent symptoms. For details visit [email or web address].   Also download the MyChart app! Go to the app store, search "MyChart", open the app, select [place], and log in with your MyChart username and password.

## 2023-01-28 NOTE — Patient Instructions (Signed)

## 2023-02-01 ENCOUNTER — Inpatient Hospital Stay (HOSPITAL_BASED_OUTPATIENT_CLINIC_OR_DEPARTMENT_OTHER): Payer: Commercial Managed Care - PPO | Admitting: Gynecologic Oncology

## 2023-02-01 ENCOUNTER — Encounter: Payer: Self-pay | Admitting: Gynecologic Oncology

## 2023-02-01 VITALS — BP 134/82 | HR 104 | Resp 18 | Wt 199.0 lb

## 2023-02-01 DIAGNOSIS — C800 Disseminated malignant neoplasm, unspecified: Secondary | ICD-10-CM

## 2023-02-01 DIAGNOSIS — R197 Diarrhea, unspecified: Secondary | ICD-10-CM | POA: Diagnosis not present

## 2023-02-01 DIAGNOSIS — C482 Malignant neoplasm of peritoneum, unspecified: Secondary | ICD-10-CM | POA: Diagnosis not present

## 2023-02-01 DIAGNOSIS — C579 Malignant neoplasm of female genital organ, unspecified: Secondary | ICD-10-CM

## 2023-02-01 DIAGNOSIS — Z5111 Encounter for antineoplastic chemotherapy: Secondary | ICD-10-CM | POA: Diagnosis not present

## 2023-02-01 NOTE — Progress Notes (Signed)
Gynecologic Oncology Return Clinic Visit  02/01/23  Reason for Visit: surveillance  Treatment History: Oncology History  Malignant neoplasm of ovary (HCC)  11/15/2021 Initial Diagnosis   Malignant neoplasm of ovary (HCC)   01/07/2023 -  Chemotherapy   Patient is on Treatment Plan : OVARIAN Bevacizumab q21d     Primary peritoneal carcinomatosis (HCC)  11/13/2021 Imaging   CT A/P: 1. Trace ascites throughout the abdomen and pelvis, with extensive peritoneal thickening, nodularity, and omental caking. Findings are consistent with widespread peritoneal and omental metastatic disease. 2. No evidence of primary malignancy in the chest, abdomen, or pelvis, with specific attention to the most likely etiologies of peritoneal metastatic disease including pancreatic, ovarian, and endometrial malignancy. Although there is no obvious mass, pelvic ultrasound may be helpful to more closely assess the female reproductive organs. 3. Small left pleural effusion and associated atelectasis or consolidation, nonspecific although worrisome for malignant effusion. No overt pleural thickening or nodularity. 4. Scattered areas of nonspecific infectious or inflammatory alveolar ground-glass airspace opacity throughout the lungs. 5. Cholelithiasis without evidence of acute cholecystitis. 6. Nonobstructive right nephrolithiasis. 7. Postoperative findings of same day laparoscopy including minimal pneumoperitoneum.   Aortic Atherosclerosis (ICD10-I70.0).   11/13/2021 Surgery   Diagnostic laparoscopy with biopsy Dr. Lovell Sheehan Findings: Significant bloody ascites, studding of the omentum as well as the upper abdominal wall and diaphragm.  Nodularity noted along the small intestine.  Omental disease inhibited view of the pelvis.   11/17/2021 Initial Diagnosis   Primary peritoneal carcinomatosis (HCC)   11/21/2021 - 01/25/2022 Chemotherapy   Patient is on Treatment Plan : OVARIAN Carboplatin (AUC 6) /  Paclitaxel (175) q21d x 6 cycles     11/21/2021 - 12/17/2022 Chemotherapy   Patient is on Treatment Plan : OVARIAN Carboplatin (AUC 6) + Paclitaxel (175)  + Mvasi q21d X 6 Cycles     01/19/2022 Imaging   CT A/P: 1. Small amount of adherent thrombus or fibrin sheath around the catheter in the LEFT brachiocephalic vein. 2. Small low-attenuation area in the inter lobar fissure region associated with medial segment of LEFT hepatic lobe, new from previous imaging, may represent developing "variable fat deposition" though given history of peritoneal disease would suggest attention on subsequent imaging. 3. Diminished thickening of mesenteric reflections, decreased ascites and decreased omental infiltration since previous imaging. 4. Focal concentric narrowing of the rectosigmoid junction is nonspecific but suspicious for neoplasm potentially related to serosal involvement in the pelvis in this patient with known peritoneal disease from ovarian cancer. Underlying colonic neoplasm would be difficult to exclude as well. Colonic assessment with endoscopy or comparison with recent endoscopy performed is suggested. 5. Cholelithiasis without evidence of acute cholecystitis. 6. 4 mm RIGHT lower pole renal calculus.   Aortic Atherosclerosis (ICD10-I70.0).   03/06/2022 Imaging   CT C/A/P: Chest CT:   Scattered subsegmental pulmonary emboli in the right lung.   Abdominal CT:   1. Mild right hydroureteronephrosis from a 4 mm UVJ calculus. 2. Unchanged omental disease. 3. Cholelithiasis and other chronic findings described above.   05/24/2022 Imaging   Increased peritoneal carcinomatosis and mild ascites.   New moderate distal small bowel obstruction, with transition point in the central pelvis adjacent to the uterine fundus, which may be due to peritoneal carcinomatosis or adhesion.   Cholelithiasis. No radiographic evidence of cholecystitis.   Aortic Atherosclerosis (ICD10-I70.0).    08/21/2022 Imaging   CT A/P: 1. Improved peritoneal carcinomatosis and mild ascites. 2. Prominent/mildly enlarged loops of small bowel throughout the abdomen with  transition point in the pelvis, not significantly changed from prior and compatible with a least low-grade obstruction. 3. Cholelithiasis without findings of acute cholecystitis.   10/01/2022 Surgery   Diagnostic laparoscopy, robotic assisted laparoscopic lysis of adhesions, exploratory laparotomy, omentectomy   Findings: On EUA, small uterus within minimal mobility. On intra-abdominal entry, omentum adherent to the anterior abdominal wall at the level of the transverse colon. Some adhesions noted between the anterior liver surface and the diaphragm on the right. Small miliary disease noted on bilateral diaphragms, small bowel, small bowel mesentery, abdominal and pelvic peritoneum. No ascites. Complete retroperitonealization of the pelvic organs. Multiple loops of small bowel were densely adherent to the uterine fundus without obvious obstruction. Terminal ileum and cecum also adherent along the right pelvic sidewall. Sigmoid colon densely adherent to a loop of ileum and to the lateral aspect of the retroperitonealized pelvis on the left. After opening the retroperitoneum bilaterally and transecting the round ligaments, the bladder flap was able to be partially developed anteriorly. This revealed dense adhesions of the sigmoid to the lateral uterus. Given prior discussions with the patient, I did not feel that proceeding with further attempt at lysis of adhesions (some mobilization of two loops of ileum adherent to the uterine fundus was performed) would be possible without significant risk of injury to multiple loops of small bowel and to the sigmoid colon. 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 On laparotomy, 8 x 4 cm omental cake noted. No disease visualized or palpated within the  lesser sac. There was no palpable disease along the liver edges or spleen. Stomach was normal in appearance. Some miliary disease palpated along the abdominal peritoneum. Numerous miliary disease noted along the small bowel and small bowel mesentery. Numerous prominent lymph nodes (1cm or less) within the stalk of the small bowel mesentery.  R1 resection based on miliary disease (in the setting of incomplete pelvic evaluation).   10/01/2022 Pathology Results   A. Pelvic sidewall, right, excision - High-grade serous carcinoma   B. Omentum, resection - High-grade serous carcinoma - Treatment effect present - See comment   C. Small bowel, implant, excision - Infarcted epiploic appendage - Negative for carcinoma  Immunohistochemical stains were performed on block B1. The tumor cells are positive for WT1, compatible with tubo-ovarian or peritoneal origin. p53 demonstrates faint positivity in rare cells, favored to be most consistent with a null pattern.    11/05/2022 Genetic Testing   Negative genetic testing on the [facility] Multi-cancer + RNA panel.  The report date is November 05, 2022.  The Multi-Cancer + RNA Panel offered by [facility] includes sequencing and/or deletion/duplication analysis of the following 70 genes:  AIP*, ALK, APC*, ATM*, AXIN2*, BAP1*, BARD1*, BLM*, BMPR1A*, BRCA1*, BRCA2*, BRIP1*, CDC73*, CDH1*, CDK4, CDKN1B*, CDKN2A, CHEK2*, CTNNA1*, DICER1*, EPCAM (del/dup only), EGFR, FH*, FLCN*, GREM1 (promoter dup only), HOXB13, KIT, LZTR1, MAX*, MBD4, MEN1*, MET, MITF, MLH1*, MSH2*, MSH3*, MSH6*, MUTYH*, NF1*, NF2*, NTHL1*, PALB2*, PDGFRA, PMS2*, POLD1*, POLE*, POT1*, PRKAR1A*, PTCH1*, PTEN*, RAD51C*, RAD51D*, RB1*, RET, SDHA* (sequencing only), SDHAF2*, SDHB*, SDHC*, SDHD*, SMAD4*, SMARCA4*, SMARCB1*, SMARCE1*, STK11*, SUFU*, TMEM127*, TP53*, TSC1*, TSC2*, VHL*. RNA analysis is performed for * genes.    01/07/2023 -  Chemotherapy   Patient is on Treatment Plan : OVARIAN Bevacizumab q21d        Interval History: Doing well.  Began PARPi in early August.  Struggling with some fatigue, can sleep up to a day and a half about once a week.  Continues to have diarrhea associated with a Avastin.  This starts about a day after her infusion on last 1 week.  Lomotil offer some relief.  Had worsening of her neuropathy after adjuvant chemotherapy, feels this in her toes and a little bit in her fingers.  Has a good appetite without nausea or emesis.  Denies any abdominal or pelvic pain.  Past Medical/Surgical History: Past Medical History:  Diagnosis Date   Acute respiratory disease due to COVID-19 virus 02/02/2019   Anxiety    Cancer (HCC)    Depression    Dyspnea    Dysrhythmia    Family history of bladder cancer    Family history of breast cancer    History of diabetes mellitus    Resolved in Sept 2023 with weight loss.   Hypertension    Ovarian cancer (HCC) 11/14/2021   Peritoneal carcinomatosis (HCC)    Pneumonia    Sleep apnea    doesn't use CPAP   Tachycardia     Past Surgical History:  Procedure Laterality Date   KNEE SURGERY Right    Arthroscopic   LAPAROSCOPIC ABDOMINAL EXPLORATION N/A 11/13/2021   Procedure: Exploratory laparoscopy;  Surgeon: Franky Macho, MD;  Location: [facility];  Service: General;  Laterality: N/A;   PORTACATH PLACEMENT Left 11/15/2021   Procedure: INSERTION PORT-A-CATH;  Surgeon: Franky Macho, MD;  Location: [facility];  Service: General;  Laterality: Left;   TONSILLECTOMY AND ADENOIDECTOMY      Family History  Problem Relation Age of Onset   Hypertension Mother    Heart attack Paternal Uncle    Bladder Cancer Maternal Grandmother 52       non-smoker   Dementia Maternal Grandfather    Breast cancer Maternal Great-grandmother        MGF's mother   Colon cancer Neg Hx    Ovarian cancer Neg Hx    Endometrial cancer Neg Hx    Pancreatic cancer Neg Hx    Prostate cancer Neg Hx     Social History   Socioeconomic History   Marital status:  Married    Spouse name: Not on file   Number of children: Not on file   Years of education: Not on file   Highest education level: Not on file  Occupational History   Occupation: Unemployed  Tobacco Use   Smoking status: Never   Smokeless tobacco: Never  Vaping Use   Vaping status: Never Used  Substance and Sexual Activity   Alcohol use: No   Drug use: No   Sexual activity: Not Currently  Other Topics Concern   Not on file  Social History Narrative   Not on file   Social Determinants of Health   Financial Resource Strain: Not on file  Food Insecurity: No Food Insecurity (03/06/2022)   Hunger Vital Sign    Worried About Running Out of Food in the Last Year: Never true    Ran Out of Food in the Last Year: Never true  Transportation Needs: No Transportation Needs (03/06/2022)   PRAPARE - Administrator, Civil Service (Medical): No    Lack of Transportation (Non-Medical): No  Physical Activity: Not on file  Stress: Not on file  Social Connections: Not on file    Current Medications:  Current Outpatient Medications:    apixaban (ELIQUIS) 5 MG TABS tablet, Take 1 tablet (5 mg total) by mouth 2 (two) times daily., Disp: 180 tablet, Rfl: 3   atorvastatin (LIPITOR) 20 MG tablet, Take 1  tablet (20 mg total) by mouth at bedtime., Disp: 90 tablet, Rfl: 3   diphenoxylate-atropine (LOMOTIL) 2.5-0.025 MG tablet, Take 2 tablets by mouth after the first watery bowel movement. May then take 1 pill by mouth after each watery bowel movement. Do not exceed 8 pills in a 24 hour period., Disp: 60 tablet, Rfl: 3   DULoxetine (CYMBALTA) 30 MG capsule, TAKE 1 CAPSULE BY MOUTH DAILY FOR 7 DAYS, THEN INCREASE TO 2 CAPSULES DAILY, Disp: 60 capsule, Rfl: 5   lidocaine-prilocaine (EMLA) cream, Apply a quarter sized amount to port a cath site and cover with plastic wrap 1 hour prior to infusion appointments, Disp: 30 g, Rfl: 3   magnesium oxide (MAG-OX) 400 (240 Mg) MG tablet, Take 1 tablet  (400 mg total) by mouth in the morning, at noon, and at bedtime., Disp: 90 tablet, Rfl: 2   Multiple Vitamin (MULTIVITAMIN) capsule, Take 1 capsule by mouth daily., Disp: , Rfl:    olaparib (LYNPARZA) 150 MG tablet, Take 2 tablets (300 mg total) by mouth 2 (two) times daily. Swallow whole. May take with food to decrease nausea and vomiting., Disp: 120 tablet, Rfl: 3   Potassium 99 MG TABS, Take 99 mg by mouth at bedtime., Disp: , Rfl:    pregabalin (LYRICA) 100 MG capsule, Take 100 mg by mouth 2 (two) times daily., Disp: , Rfl:    prochlorperazine (COMPAZINE) 10 MG tablet, Take 1 tablet (10 mg total) by mouth every 6 (six) hours as needed for nausea or vomiting., Disp: 30 tablet, Rfl: 3 No current facility-administered medications for this visit.  Facility-Administered Medications Ordered in Other Visits:    heparin lock flush 100 unit/mL, 500 Units, Intravenous, Once, [name], MD   sodium chloride flush (NS) 0.9 % injection 10 mL, 10 mL, Intravenous, PRN, [name], MD  Review of Systems: + diarrhea Denies appetite changes, fevers, chills, fatigue, unexplained weight changes. Denies hearing loss, neck lumps or masses, mouth sores, ringing in ears or voice changes. Denies cough or wheezing.  Denies shortness of breath. Denies chest pain or palpitations. Denies leg swelling. Denies abdominal distention, pain, blood in stools, constipation, nausea, vomiting, or early satiety. Denies pain with intercourse, dysuria, frequency, hematuria or incontinence. Denies hot flashes, pelvic pain, vaginal bleeding or vaginal discharge.   Denies joint pain, back pain or muscle pain/cramps. Denies itching, rash, or wounds. Denies dizziness, headaches, numbness or seizures. Denies swollen lymph nodes or glands, denies easy bruising or bleeding. Denies anxiety, depression, confusion, or decreased concentration.  Physical Exam: BP 134/82   Pulse (!) 104   Resp 18   Wt 199 lb (90.3  kg)   SpO2 98%   BMI 36.40 kg/m  General: Alert, oriented, no acute distress. HEENT: Normocephalic, atraumatic, sclera anicteric. Chest: Clear to auscultation bilaterally.  No wheezes or rhonchi. Cardiovascular: Heart rate in the 90s, regular rhythm, no murmurs. Abdomen: Obese, soft, nontender.  Normoactive bowel sounds.  No masses or hepatosplenomegaly appreciated.  Well-healed incisions. Extremities: Grossly normal range of motion.  Warm, well perfused.  No edema bilaterally. Skin: No rashes or lesions noted. Lymphatics: No cervical, supraclavicular adenopathy. GU: Declined.  Laboratory & Radiologic Studies:          Component Ref Range & Units 3 wk ago 2 mo ago 6 mo ago 7 mo ago 8 mo ago 9 mo ago 10 mo ago  Cancer Antigen (CA) 125 0.0 - 38.1 U/mL 8.9 11.2 CM 11.6 CM 16.0 CM 20.4 CM 13.4 CM 27.4  Assessment & Plan: JANEA FETSCH is a 52 y.o. 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 S/p 3 cycles of adj platinum-based therapy after surgery, now on maintenance avastin/olaparib in the setting of HRD+ tumor. P53 mutated.    The patient is overall doing well.  Discussed her a Avastin and associated diarrhea.  She is able to combat the symptoms somewhat with over-the-counter medications and is staying hydrated.  Discussed importance of letting Dr. Kirtland Bouchard know if diarrhea worsens.  Also discussed fatigue related to PARPi.  Hopefully, this will improve over a couple of months.  If it does not, dose reduction could be considered.  We discussed imaging.  The patient has not had imaging since prior to surgery (did not have imaging after completing adjuvant chemotherapy).  She is feeling very well and CA125 has normalized.  We discussed the benefit of getting post adjuvant treatment imaging as a baseline.  She is interested in doing this.  I ordered a CT scan to be done at [facility].  I will contact her with these results.   I will plan to  see the patient back in 4 months for follow-up.     20 minutes of total time was spent for this patient encounter, including preparation, face-to-face counseling with the patient and coordination of care, and documentation of the encounter.  Eugene Garnet, MD  Division of Gynecologic Oncology  Department of Obstetrics and Gynecology  [facility]

## 2023-02-01 NOTE — Patient Instructions (Signed)
It was good to see you today.  I will let you know when I get your CT results.  I will plan to see you back in 4 months.  Please do not hesitate to reach out if you need anything before then.

## 2023-02-02 ENCOUNTER — Other Ambulatory Visit: Payer: Self-pay

## 2023-02-07 ENCOUNTER — Ambulatory Visit (HOSPITAL_COMMUNITY)
Admission: RE | Admit: 2023-02-07 | Discharge: 2023-02-07 | Disposition: A | Discharge status: Home / Self Care | Payer: Commercial Managed Care - PPO | Source: Ambulatory Visit | Attending: Gynecologic Oncology | Admitting: Gynecologic Oncology

## 2023-02-07 DIAGNOSIS — C800 Disseminated malignant neoplasm, unspecified: Secondary | ICD-10-CM | POA: Diagnosis present

## 2023-02-07 DIAGNOSIS — C579 Malignant neoplasm of female genital organ, unspecified: Secondary | ICD-10-CM | POA: Insufficient documentation

## 2023-02-07 MED ORDER — IOHEXOL 300 MG/ML  SOLN
100.0000 mL | Freq: Once | INTRAMUSCULAR | Status: AC | PRN
  Administered 2023-02-07: 100 mL via INTRAVENOUS

## 2023-02-17 NOTE — Progress Notes (Signed)
[facility] [address] [name] [address]    Clinic Day:  02/18/2023  Referring physician: [name], DO  Patient Care Team: [name], DO as PCP - General (Family Medicine) [name], MD as Referring Physician (Optometry) [name], MD as Medical Oncologist (Medical Oncology) [name], RN as Oncology Nurse Navigator (Medical Oncology)   ASSESSMENT & PLAN:   Assessment: 1. High-grade serous carcinoma of peritoneum: - She had developed sharp left-sided abdominal pain since Mother's Day, occasional radiating to the back.  40 pound weight loss in the last 6 months. - Personal history of cervical cancer in 2000, status post chemo plus XRT at [facility].  She has slightly decreased hearing in the left ear since then. - US abdomen (11/08/2021): Cholelithiasis with gallbladder wall thickening and sonographic Murphy sign.  Upper abdominal ascites. - Laparoscopy (11/13/2021): Starting of the omentum as well as the upper abdominal wall and diaphragm.  Liver was within normal limits.  6 L of ascites was evacuated.  Omental and peritoneal biopsies were sent.  Peritoneal implants throughout and a significant portion of omentum and on the small intestine. - CT CAP (11/13/2021): Ascites, extensive peritoneal thickening, nodularity and omental caking.  No evidence of primary malignancy in the chest, abdomen or pelvis.  Small left pleural effusion, nonspecific.  Cholelithiasis. - US pelvis (11/14/2021): Uterus is grossly unremarkable.  Ovaries not visualized. - CA125 (11/13/2021): 1269 - Pathology: Peritoneum and omental biopsy consistent with high-grade serous carcinoma.  HER2 2+ by IHC and negative by FISH. - 4 cycles of carboplatin and paclitaxel from 11/21/2021 through 01/23/2022, cycle 9 on 08/29/2022. - 10/01/2022: Diagnostic laparoscopy, exploratory laparotomy, omentectomy - Pathology: Right pelvic sidewall excision-high-grade  serous carcinoma, omental resection-high-grade serous carcinoma with treatment effect present.  Small bowel implant excision was negative for carcinoma. - Germline mutation testing negative - Foundation 1 NGS (10/01/2022): LOH-18.9%, HRD +, MS-stable, TMB-low - Olaparib 300 mg twice daily and bevacizumab maintenance started on 01/07/2023  2. Social/family history: - Lives at home with her husband.  Worked in Engineering geologist at [employer].  Non-smoker. - Maternal grandmother with bladder cancer.  Maternal great grandmother with breast cancer.    Plan: High-grade serous carcinoma of peritoneum: - She is tolerating Lynparza 300 mg twice daily reasonably well. - She reported diarrhea which lasts about 1 week after each bevacizumab infusion.  She is taking Lomotil which helps occasionally.  Imodium did not help at all. - Reviewed labs today: Normal LFTs and creatinine.  CBC grossly normal.  Last CA125 was 8.9. - UA shows protein 30. - Reviewed CTAP (02/07/2023): No evidence of residual or recurrent malignancy.  Previous omental stranding and ascites resolved. - She will proceed with bevacizumab today.  Continue Lynparza 300 mg twice daily. - Recommend taking Lomotil 2 tablets 3 times daily for diarrhea induced by Avastin during the first week. - RTC 6 weeks for follow-up.   2.  Peripheral neuropathy: - Continue Cymbalta 60 mg daily.  Numbness in the toes is stable.  3.  Hypomagnesemia: - Continue magnesium 3 times daily.  Magnesium is normal.  4.  Hypokalemia: - Continue potassium 20 mill equivalents daily.  Potassium is normal.   5.  Subsegmental pulmonary embolism (Dx 03/06/2022): - Continue Eliquis twice daily.  No bleeding issues.    No orders of the defined types were placed in this encounter.     I,Katie Daubenspeck,acting as a Neurosurgeon for [name], MD.,have documented all relevant  documentation on the behalf of [name], MD,as directed by  [name], MD while in  the presence of [name], MD.   I, [name] MD, have reviewed the above documentation for accuracy and completeness, and I agree with the above.   [name], MD   9/16/20245:04 PM  CHIEF COMPLAINT:   Diagnosis: high-grade serous carcinoma of peritoneum    Cancer Staging  Primary peritoneal carcinomatosis [facility]) Staging form: Ovary, Fallopian Tube, and Primary Peritoneal Carcinoma, AJCC 8th Edition - Clinical stage from 11/17/2021: FIGO Stage IIIC (cT3c, cM0) - Unsigned    Prior Therapy: Carboplatin and paclitaxel-- x 4 cycles 11/21/2021 - 01/23/2022, x5 cycles 06/05/22 - 08/29/22, x3 cycles 11/05/22 - 12/17/22   Current Therapy:  olaparib and maintenance bevacizumab    HISTORY OF PRESENT ILLNESS:   Oncology History  Malignant neoplasm of ovary (HCC)  11/15/2021 Initial Diagnosis   Malignant neoplasm of ovary (HCC)   01/07/2023 -  Chemotherapy   Patient is on Treatment Plan : OVARIAN Bevacizumab q21d     Primary peritoneal carcinomatosis (HCC)  11/13/2021 Imaging   CT A/P: 1. Trace ascites throughout the abdomen and pelvis, with extensive peritoneal thickening, nodularity, and omental caking. Findings are consistent with widespread peritoneal and omental metastatic disease. 2. No evidence of primary malignancy in the chest, abdomen, or pelvis, with specific attention to the most likely etiologies of peritoneal metastatic disease including pancreatic, ovarian, and endometrial malignancy. Although there is no obvious mass, pelvic ultrasound may be helpful to more closely assess the female reproductive organs. 3. Small left pleural effusion and associated atelectasis or consolidation, nonspecific although worrisome for malignant effusion. No overt pleural thickening or nodularity. 4. Scattered areas of nonspecific infectious or inflammatory alveolar ground-glass airspace opacity throughout the lungs. 5. Cholelithiasis without evidence of acute  cholecystitis. 6. Nonobstructive right nephrolithiasis. 7. Postoperative findings of same day laparoscopy including minimal pneumoperitoneum.   Aortic Atherosclerosis (ICD10-I70.0).   11/13/2021 Surgery   Diagnostic laparoscopy with biopsy Dr. Lovell Sheehan Findings: Significant bloody ascites, studding of the omentum as well as the upper abdominal wall and diaphragm.  Nodularity noted along the small intestine.  Omental disease inhibited view of the pelvis.   11/17/2021 Initial Diagnosis   Primary peritoneal carcinomatosis (HCC)   11/21/2021 - 01/25/2022 Chemotherapy   Patient is on Treatment Plan : OVARIAN Carboplatin (AUC 6) / Paclitaxel (175) q21d x 6 cycles     11/21/2021 - 12/17/2022 Chemotherapy   Patient is on Treatment Plan : OVARIAN Carboplatin (AUC 6) + Paclitaxel (175)  + Mvasi q21d X 6 Cycles     01/19/2022 Imaging   CT A/P: 1. Small amount of adherent thrombus or fibrin sheath around the catheter in the LEFT brachiocephalic vein. 2. Small low-attenuation area in the inter lobar fissure region associated with medial segment of LEFT hepatic lobe, new from previous imaging, may represent developing "variable fat deposition" though given history of peritoneal disease would suggest attention on subsequent imaging. 3. Diminished thickening of mesenteric reflections, decreased ascites and decreased omental infiltration since previous imaging. 4. Focal concentric narrowing of the rectosigmoid junction is nonspecific but suspicious for neoplasm potentially related to serosal involvement in the pelvis in this patient with known peritoneal disease from ovarian cancer. Underlying colonic neoplasm would be difficult to exclude as well. Colonic assessment with endoscopy or comparison with recent endoscopy performed is suggested. 5. Cholelithiasis without evidence of acute cholecystitis. 6. 4 mm RIGHT lower pole renal calculus.   Aortic Atherosclerosis (ICD10-I70.0).   03/06/2022 Imaging  CT C/A/P: Chest CT:   Scattered subsegmental pulmonary emboli in the right lung.   Abdominal CT:   1. Mild right hydroureteronephrosis from a 4 mm UVJ calculus. 2. Unchanged omental disease. 3. Cholelithiasis and other chronic findings described above.   05/24/2022 Imaging   Increased peritoneal carcinomatosis and mild ascites.   New moderate distal small bowel obstruction, with transition point in the central pelvis adjacent to the uterine fundus, which may be due to peritoneal carcinomatosis or adhesion.   Cholelithiasis. No radiographic evidence of cholecystitis.   Aortic Atherosclerosis (ICD10-I70.0).   08/21/2022 Imaging   CT A/P: 1. Improved peritoneal carcinomatosis and mild ascites. 2. Prominent/mildly enlarged loops of small bowel throughout the abdomen with transition point in the pelvis, not significantly changed from prior and compatible with a least low-grade obstruction. 3. Cholelithiasis without findings of acute cholecystitis.   10/01/2022 Surgery   Diagnostic laparoscopy, robotic assisted laparoscopic lysis of adhesions, exploratory laparotomy, omentectomy   Findings: On EUA, small uterus within minimal mobility. On intra-abdominal entry, omentum adherent to the anterior abdominal wall at the level of the transverse colon. Some adhesions noted between the anterior liver surface and the diaphragm on the right. Small miliary disease noted on bilateral diaphragms, small bowel, small bowel mesentery, abdominal and pelvic peritoneum. No ascites. Complete retroperitonealization of the pelvic organs. Multiple loops of small bowel were densely adherent to the uterine fundus without obvious obstruction. Terminal ileum and cecum also adherent along the right pelvic sidewall. Sigmoid colon densely adherent to a loop of ileum and to the lateral aspect of the retroperitonealized pelvis on the left. After opening the retroperitoneum bilaterally and transecting the round  ligaments, the bladder flap was able to be partially developed anteriorly. This revealed dense adhesions of the sigmoid to the lateral uterus. Given prior discussions with the patient, I did not feel that proceeding with further attempt at lysis of adhesions (some mobilization of two loops of ileum adherent to the uterine fundus was performed) would be possible without significant risk of injury to multiple loops of small bowel and to the sigmoid colon. 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 On laparotomy, 8 x 4 cm omental cake noted. No disease visualized or palpated within the lesser sac. There was no palpable disease along the liver edges or spleen. Stomach was normal in appearance. Some miliary disease palpated along the abdominal peritoneum. Numerous miliary disease noted along the small bowel and small bowel mesentery. Numerous prominent lymph nodes (1cm or less) within the stalk of the small bowel mesentery.  R1 resection based on miliary disease (in the setting of incomplete pelvic evaluation).   10/01/2022 Pathology Results   A. Pelvic sidewall, right, excision - High-grade serous carcinoma   B. Omentum, resection - High-grade serous carcinoma - Treatment effect present - See comment   C. Small bowel, implant, excision - Infarcted epiploic appendage - Negative for carcinoma  Immunohistochemical stains were performed on block B1. The tumor cells are positive for WT1, compatible with tubo-ovarian or peritoneal origin. p53 demonstrates faint positivity in rare cells, favored to be most consistent with a null pattern.    11/05/2022 Genetic Testing   Negative genetic testing on the [facility] Multi-cancer + RNA panel.  The report date is November 05, 2022.  The Multi-Cancer + RNA Panel offered by [facility] includes sequencing and/or deletion/duplication analysis of the following 70 genes:  AIP*, ALK, APC*, ATM*, AXIN2*, BAP1*, BARD1*, BLM*, BMPR1A*,  BRCA1*, BRCA2*, BRIP1*, CDC73*, CDH1*, CDK4,  CDKN1B*, CDKN2A, CHEK2*, CTNNA1*, DICER1*, EPCAM (del/dup only), EGFR, FH*, FLCN*, GREM1 (promoter dup only), HOXB13, KIT, LZTR1, MAX*, MBD4, MEN1*, MET, MITF, MLH1*, MSH2*, MSH3*, MSH6*, MUTYH*, NF1*, NF2*, NTHL1*, PALB2*, PDGFRA, PMS2*, POLD1*, POLE*, POT1*, PRKAR1A*, PTCH1*, PTEN*, RAD51C*, RAD51D*, RB1*, RET, SDHA* (sequencing only), SDHAF2*, SDHB*, SDHC*, SDHD*, SMAD4*, SMARCA4*, SMARCB1*, SMARCE1*, STK11*, SUFU*, TMEM127*, TP53*, TSC1*, TSC2*, VHL*. RNA analysis is performed for * genes.    01/07/2023 -  Chemotherapy   Patient is on Treatment Plan : OVARIAN Bevacizumab q21d        INTERVAL HISTORY:   Nicole Santos is a 52 y.o. female presenting to clinic today for follow up of high-grade serous carcinoma of peritoneum. She was last seen by me on 01/28/23.  Since her last visit, she underwent restaging CT A/P on 02/07/23.   Today, she states that she is doing well overall. Her appetite level is at 100%. Her energy level is at 50%.  PAST MEDICAL HISTORY:   Past Medical History: Past Medical History:  Diagnosis Date   Acute respiratory disease due to COVID-19 virus 02/02/2019   Anxiety    Cancer (HCC)    Depression    Dyspnea    Dysrhythmia    Family history of bladder cancer    Family history of breast cancer    History of diabetes mellitus    Resolved in Sept 2023 with weight loss.   Hypertension    Ovarian cancer (HCC) 11/14/2021   Peritoneal carcinomatosis (HCC)    Pneumonia    Sleep apnea    doesn't use CPAP   Tachycardia     Surgical History: Past Surgical History:  Procedure Laterality Date   KNEE SURGERY Right    Arthroscopic   LAPAROSCOPIC ABDOMINAL EXPLORATION N/A 11/13/2021   Procedure: Exploratory laparoscopy;  Surgeon: Franky Macho, MD;  Location: [facility];  Service: General;  Laterality: N/A;   PORTACATH PLACEMENT Left 11/15/2021   Procedure: INSERTION PORT-A-CATH;  Surgeon: Franky Macho, MD;  Location: [facility];  Service:  General;  Laterality: Left;   TONSILLECTOMY AND ADENOIDECTOMY      Social History: Social History   Socioeconomic History   Marital status: Married    Spouse name: Not on file   Number of children: Not on file   Years of education: Not on file   Highest education level: Not on file  Occupational History   Occupation: Unemployed  Tobacco Use   Smoking status: Never   Smokeless tobacco: Never  Vaping Use   Vaping status: Never Used  Substance and Sexual Activity   Alcohol use: No   Drug use: No   Sexual activity: Not Currently  Other Topics Concern   Not on file  Social History Narrative   Not on file   Social Determinants of Health   Financial Resource Strain: Not on file  Food Insecurity: No Food Insecurity (03/06/2022)   Hunger Vital Sign    Worried About Running Out of Food in the Last Year: Never true    Ran Out of Food in the Last Year: Never true  Transportation Needs: No Transportation Needs (03/06/2022)   PRAPARE - Administrator, Civil Service (Medical): No    Lack of Transportation (Non-Medical): No  Physical Activity: Not on file  Stress: Not on file  Social Connections: Not on file  Intimate Partner Violence: Not At Risk (03/06/2022)   Humiliation, Afraid, Rape, and Kick questionnaire    Fear of Current or Ex-Partner: No    Emotionally Abused: No  Physically Abused: No    Sexually Abused: No    Family History: Family History  Problem Relation Age of Onset   Hypertension Mother    Heart attack Paternal Uncle    Bladder Cancer Maternal Grandmother 62       non-smoker   Dementia Maternal Grandfather    Breast cancer Maternal Great-grandmother        MGF's mother   Colon cancer Neg Hx    Ovarian cancer Neg Hx    Endometrial cancer Neg Hx    Pancreatic cancer Neg Hx    Prostate cancer Neg Hx     Current Medications:  Current Outpatient Medications:    apixaban (ELIQUIS) 5 MG TABS tablet, Take 1 tablet (5 mg total) by mouth 2 (two)  times daily., Disp: 180 tablet, Rfl: 3   atorvastatin (LIPITOR) 20 MG tablet, Take 1 tablet (20 mg total) by mouth at bedtime., Disp: 90 tablet, Rfl: 3   diphenoxylate-atropine (LOMOTIL) 2.5-0.025 MG tablet, Take 2 tablets by mouth after the first watery bowel movement. May then take 1 pill by mouth after each watery bowel movement. Do not exceed 8 pills in a 24 hour period., Disp: 60 tablet, Rfl: 3   DULoxetine (CYMBALTA) 30 MG capsule, TAKE 1 CAPSULE BY MOUTH DAILY FOR 7 DAYS, THEN INCREASE TO 2 CAPSULES DAILY, Disp: 60 capsule, Rfl: 5   lidocaine-prilocaine (EMLA) cream, Apply a quarter sized amount to port a cath site and cover with plastic wrap 1 hour prior to infusion appointments, Disp: 30 g, Rfl: 3   magnesium oxide (MAG-OX) 400 (240 Mg) MG tablet, Take 1 tablet (400 mg total) by mouth in the morning, at noon, and at bedtime., Disp: 90 tablet, Rfl: 2   Multiple Vitamin (MULTIVITAMIN) capsule, Take 1 capsule by mouth daily., Disp: , Rfl:    olaparib (LYNPARZA) 150 MG tablet, Take 2 tablets (300 mg total) by mouth 2 (two) times daily. Swallow whole. May take with food to decrease nausea and vomiting., Disp: 120 tablet, Rfl: 3   Potassium 99 MG TABS, Take 99 mg by mouth at bedtime., Disp: , Rfl:    pregabalin (LYRICA) 100 MG capsule, Take 100 mg by mouth 2 (two) times daily., Disp: , Rfl:    prochlorperazine (COMPAZINE) 10 MG tablet, Take 1 tablet (10 mg total) by mouth every 6 (six) hours as needed for nausea or vomiting., Disp: 30 tablet, Rfl: 3 No current facility-administered medications for this visit.  Facility-Administered Medications Ordered in Other Visits:    heparin lock flush 100 unit/mL, 500 Units, Intravenous, Once, [name], MD   sodium chloride flush (NS) 0.9 % injection 10 mL, 10 mL, Intravenous, PRN, [name], MD   sodium chloride flush (NS) 0.9 % injection 10 mL, 10 mL, Intracatheter, PRN, [name], MD, 10 mL at 02/18/23 1455    Allergies: Allergies  Allergen Reactions   Augmentin [Amoxicillin-Pot Clavulanate] Diarrhea and Nausea And Vomiting   Doxycycline     Abdominal cramps.   Topamax [Topiramate] Nausea And Vomiting    REVIEW OF SYSTEMS:   Review of Systems  Constitutional:  Negative for chills, fatigue and fever.  HENT:   Negative for lump/mass, mouth sores, nosebleeds, sore throat and trouble swallowing.   Eyes:  Negative for eye problems.  Respiratory:  Negative for cough and shortness of breath.   Cardiovascular:  Negative for chest pain, leg swelling and palpitations.  Gastrointestinal:  Positive for diarrhea. Negative for abdominal pain, constipation, nausea and vomiting.  Genitourinary:  Negative for bladder incontinence, difficulty urinating, dysuria, frequency, hematuria and nocturia.   Musculoskeletal:  Negative for arthralgias, back pain, flank pain, myalgias and neck pain.  Skin:  Negative for itching and rash.  Neurological:  Positive for numbness. Negative for dizziness and headaches.  Hematological:  Does not bruise/bleed easily.  Psychiatric/Behavioral:  Negative for depression, sleep disturbance and suicidal ideas. The patient is not nervous/anxious.   All other systems reviewed and are negative.    VITALS:   There were no vitals taken for this visit.  Wt Readings from Last 3 Encounters:  02/18/23 196 lb 9.6 oz (89.2 kg)  02/01/23 199 lb (90.3 kg)  01/28/23 200 lb 6.4 oz (90.9 kg)    There is no height or weight on file to calculate BMI.  Performance status (ECOG): 1 - Symptomatic but completely ambulatory  PHYSICAL EXAM:   Physical Exam Vitals and nursing note reviewed. Exam conducted with a chaperone present.  Constitutional:      Appearance: Normal appearance.  Cardiovascular:     Rate and Rhythm: Normal rate and regular rhythm.     Pulses: Normal pulses.     Heart sounds: Normal heart sounds.  Pulmonary:     Effort: Pulmonary effort is normal.     Breath sounds:  Normal breath sounds.  Abdominal:     Palpations: Abdomen is soft. There is no hepatomegaly, splenomegaly or mass.     Tenderness: There is no abdominal tenderness.  Musculoskeletal:     Right lower leg: No edema.     Left lower leg: No edema.  Lymphadenopathy:     Cervical: No cervical adenopathy.     Right cervical: No superficial, deep or posterior cervical adenopathy.    Left cervical: No superficial, deep or posterior cervical adenopathy.     Upper Body:     Right upper body: No supraclavicular or axillary adenopathy.     Left upper body: No supraclavicular or axillary adenopathy.  Neurological:     General: No focal deficit present.     Mental Status: She is alert and oriented to person, place, and time.  Psychiatric:        Mood and Affect: Mood normal.        Behavior: Behavior normal.     LABS:      Latest Ref Rng & Units 02/18/2023   10:55 AM 01/28/2023    9:48 AM 01/07/2023   10:37 AM  CBC  WBC 4.0 - 10.5 K/uL 5.7  4.9  5.4   Hemoglobin 12.0 - 15.0 g/dL 01.0  9.6  9.7   Hematocrit 36.0 - 46.0 % 32.3  28.5  29.7   Platelets 150 - 400 K/uL 156  127  105       Latest Ref Rng & Units 02/18/2023   10:55 AM 01/28/2023    9:48 AM 01/07/2023   10:35 AM  CMP  Glucose 70 - 99 mg/dL 272  536  644   BUN 6 - 20 mg/dL 13  15  11    Creatinine 0.44 - 1.00 mg/dL 0.34  7.42  5.95   Sodium 135 - 145 mmol/L 138  137  135   Potassium 3.5 - 5.1 mmol/L 3.7  3.5  3.8   Chloride 98 - 111 mmol/L 103  103  105   CO2 22 - 32 mmol/L 25  24  24    Calcium 8.9 - 10.3 mg/dL 9.0  8.4  7.9   Total Protein 6.5 - 8.1 g/dL 7.1  6.6  6.5   Total Bilirubin 0.3 - 1.2 mg/dL 1.3  0.8  0.5   Alkaline Phos 38 - 126 U/L 57  54  58   AST 15 - 41 U/L 20  18  18    ALT 0 - 44 U/L 14  13  15       Lab Results  Component Value Date   CEA1 1.1 11/13/2021   /  CEA  Date Value Ref Range Status  11/13/2021 1.1 0.0 - 4.7 ng/mL Final    Comment:    (NOTE)                             Nonsmokers           <3.9                             Smokers             <5.6 [employer] Electrochemiluminescence Immunoassay (ECLIA) Values obtained with different assay methods or kits cannot be used interchangeably.  Results cannot be interpreted as absolute evidence of the presence or absence of malignant disease. Performed At: [facility] [address] [place], [name] [id number] Jolene Schimke MD VH:[id number]    No results found for: "PSA1" Lab Results  Component Value Date   CAN199 6 11/13/2021   Lab Results  Component Value Date   CAN125 8.9 01/07/2023    No results found for: "TOTALPROTELP", "ALBUMINELP", "A1GS", "A2GS", "BETS", "BETA2SER", "GAMS", "MSPIKE", "SPEI" Lab Results  Component Value Date   FERRITIN 356 (H) 02/07/2019   FERRITIN 311 (H) 02/06/2019   FERRITIN 343 (H) 02/05/2019   Lab Results  Component Value Date   LDH 259 (H) 02/02/2019     STUDIES:   CT ABDOMEN PELVIS W CONTRAST  Result Date: 02/18/2023 CLINICAL DATA:  Metastatic ovarian cancer, assessment of treatment response. Ongoing chemotherapy. * Tracking Code: BO * EXAM: CT ABDOMEN AND PELVIS WITH CONTRAST TECHNIQUE: Multidetector CT imaging of the abdomen and pelvis was performed using the standard protocol following bolus administration of intravenous contrast. RADIATION DOSE REDUCTION: This exam was performed according to the departmental dose-optimization program which includes automated exposure control, adjustment of the mA and/or kV according to patient size and/or use of iterative reconstruction technique. CONTRAST:  OMNIPAQUE IOHEXOL 300 MG/ML  SOLN COMPARISON:  08/20/2022 FINDINGS: Lower chest: Unremarkable Hepatobiliary: Numerous gallstones fill the gallbladder as on image 33 series 2. No surrounding edema or definite gallbladder wall thickening. The hepatic parenchyma appears unremarkable.  No biliary dilatation. Pancreas: Unremarkable Spleen: Unremarkable Adrenals/Urinary Tract: No  significant findings. Stomach/Bowel: Continued appearance of suspected adhesions/tethering of the distal ileum to the upper margin of the residual uterine tissue, with local scarring, as well as potential tethering of the sigmoid colon with mild wall thickening in the sigmoid colon just above the uterine fundus as shown on 08/20/2022. No current findings of obstruction. Vascular/Lymphatic: Atherosclerosis is present, including aortoiliac atherosclerotic disease. No pathologic adenopathy. Reproductive: No obvious mass along the adnexa. Stable appearance of small uterus or uterine remnant. There is some scarring along this remnant which might represent partial hysterectomy remnant of the cervix. Correlate with operative history. Other: Stable presacral edema, probably therapy related. There was previously abnormal stranding and subtle nodularity in the omentum just below the transverse colon, no longer present currently. No specific findings of significant residual omental or mesenteric nodularity. Previous ascites  both the urinary bladder and along the right paracolic gutter no longer present. Musculoskeletal: Abnormal trabecular coarsening in the sacrum and particularly in the right iliac bone, query prior radiation therapy. Paget's disease involving the right iliac bone is a differential diagnostic consideration. This is unchanged from previous. Degenerative facet arthropathy in the lower lumbar spine. Small supraumbilical hernia containing adipose tissue on image 58 series 7. IMPRESSION: 1. No specific findings of residual or recurrent malignancy. Previous omental stranding and ascites have resolved. 2. Continued appearance of suspected adhesions/tethering of the distal ileum to the residual uterus, with local scarring, as well as potential tethering of the sigmoid colon with mild wall thickening in the sigmoid colon just above the uterine fundus as shown on 08/20/2022. No current findings of obstruction. 3.  Stable presacral edema, probably therapy related. 4. Abnormal trabecular coarsening in the sacrum and particularly in the right iliac bone, query prior radiation therapy. Paget's disease involving the right iliac bone is a differential diagnostic consideration. This is unchanged from previous. 5. Numerous gallstones fill the gallbladder. 6. Small supraumbilical hernia containing adipose tissue. 7. Aortic atherosclerosis. Aortic Atherosclerosis (ICD10-I70.0). Electronically Signed   By: Gaylyn Rong M.D.   On: 02/18/2023 12:51

## 2023-02-18 ENCOUNTER — Inpatient Hospital Stay: Payer: Commercial Managed Care - PPO | Attending: Gynecologic Oncology | Admitting: Hematology

## 2023-02-18 ENCOUNTER — Encounter: Payer: Self-pay | Admitting: Hematology

## 2023-02-18 ENCOUNTER — Inpatient Hospital Stay: Payer: Commercial Managed Care - PPO

## 2023-02-18 VITALS — BP 156/69 | HR 94 | Temp 98.2°F | Resp 16

## 2023-02-18 DIAGNOSIS — Z79899 Other long term (current) drug therapy: Secondary | ICD-10-CM | POA: Diagnosis not present

## 2023-02-18 DIAGNOSIS — C482 Malignant neoplasm of peritoneum, unspecified: Secondary | ICD-10-CM

## 2023-02-18 DIAGNOSIS — Z7901 Long term (current) use of anticoagulants: Secondary | ICD-10-CM | POA: Insufficient documentation

## 2023-02-18 DIAGNOSIS — Z5112 Encounter for antineoplastic immunotherapy: Secondary | ICD-10-CM | POA: Diagnosis present

## 2023-02-18 DIAGNOSIS — C569 Malignant neoplasm of unspecified ovary: Secondary | ICD-10-CM | POA: Diagnosis not present

## 2023-02-18 LAB — COMPREHENSIVE METABOLIC PANEL
ALT: 14 U/L (ref 0–44)
AST: 20 U/L (ref 15–41)
Albumin: 3.5 g/dL (ref 3.5–5.0)
Alkaline Phosphatase: 57 U/L (ref 38–126)
Anion gap: 10 (ref 5–15)
BUN: 13 mg/dL (ref 6–20)
CO2: 25 mmol/L (ref 22–32)
Calcium: 9 mg/dL (ref 8.9–10.3)
Chloride: 103 mmol/L (ref 98–111)
Creatinine, Ser: 0.86 mg/dL (ref 0.44–1.00)
GFR, Estimated: >60 mL/min (ref >60)
Glucose, Bld: 150 mg/dL — ABNORMAL HIGH (ref 70–99)
Potassium: 3.7 mmol/L (ref 3.5–5.1)
Sodium: 138 mmol/L (ref 135–145)
Total Bilirubin: 1.3 mg/dL — ABNORMAL HIGH (ref 0.3–1.2)
Total Protein: 7.1 g/dL (ref 6.5–8.1)

## 2023-02-18 LAB — URINALYSIS, DIPSTICK ONLY
Bilirubin Urine: NEGATIVE
Glucose, UA: NEGATIVE mg/dL
Hgb urine dipstick: NEGATIVE
Ketones, ur: NEGATIVE mg/dL
Leukocytes,Ua: NEGATIVE
Nitrite: NEGATIVE
Protein, ur: 30 mg/dL — AB
Specific Gravity, Urine: 1.029 (ref 1.005–1.030)
pH: 5 (ref 5.0–8.0)

## 2023-02-18 LAB — CBC WITH DIFFERENTIAL/PLATELET
Abs Immature Granulocytes: 0.03 10*3/uL (ref 0.00–0.07)
Basophils Absolute: 0 10*3/uL (ref 0.0–0.1)
Basophils Relative: 0 %
Eosinophils Absolute: 0.1 10*3/uL (ref 0.0–0.5)
Eosinophils Relative: 2 %
HCT: 32.3 % — ABNORMAL LOW (ref 36.0–46.0)
Hemoglobin: 10.8 g/dL — ABNORMAL LOW (ref 12.0–15.0)
Immature Granulocytes: 1 %
Lymphocytes Relative: 20 %
Lymphs Abs: 1.1 10*3/uL (ref 0.7–4.0)
MCH: 35.9 pg — ABNORMAL HIGH (ref 26.0–34.0)
MCHC: 33.4 g/dL (ref 30.0–36.0)
MCV: 107.3 fL — ABNORMAL HIGH (ref 80.0–100.0)
Monocytes Absolute: 0.5 10*3/uL (ref 0.1–1.0)
Monocytes Relative: 9 %
Neutro Abs: 3.9 10*3/uL (ref 1.7–7.7)
Neutrophils Relative %: 68 %
Platelets: 156 10*3/uL (ref 150–400)
RBC: 3.01 MIL/uL — ABNORMAL LOW (ref 3.87–5.11)
RDW: 19 % — ABNORMAL HIGH (ref 11.5–15.5)
WBC: 5.7 10*3/uL (ref 4.0–10.5)
nRBC: 0.9 % — ABNORMAL HIGH (ref 0.0–0.2)

## 2023-02-18 LAB — MAGNESIUM: Magnesium: 1.7 mg/dL (ref 1.7–2.4)

## 2023-02-18 MED ORDER — SODIUM CHLORIDE 0.9% FLUSH
10.0000 mL | INTRAVENOUS | Status: DC | PRN
  Administered 2023-02-18: 10 mL

## 2023-02-18 MED ORDER — SODIUM CHLORIDE 0.9% FLUSH
10.0000 mL | INTRAVENOUS | Status: AC
  Administered 2023-02-18: 10 mL

## 2023-02-18 MED ORDER — SODIUM CHLORIDE 0.9 % IV SOLN
15.0000 mg/kg | Freq: Once | INTRAVENOUS | Status: AC
  Administered 2023-02-18: 1400 mg via INTRAVENOUS
  Filled 2023-02-18: qty 48

## 2023-02-18 MED ORDER — HEPARIN SOD (PORK) LOCK FLUSH 100 UNIT/ML IV SOLN
500.0000 [IU] | Freq: Once | INTRAVENOUS | Status: AC | PRN
  Administered 2023-02-18: 500 [IU]

## 2023-02-18 MED ORDER — SODIUM CHLORIDE 0.9 % IV SOLN: Freq: Once | INTRAVENOUS | Status: AC

## 2023-02-18 NOTE — Patient Instructions (Signed)
[facility]  Discharge Instructions: Thank you for choosing [facility] to provide your oncology and hematology care.  If you have a lab appointment with the Cancer Center - please note that after April 8th, 2024, all labs will be drawn in the cancer center.  You do not have to check in or register with the main entrance as you have in the past but will complete your check-in in the cancer center.  Wear comfortable clothing and clothing appropriate for easy access to any Portacath or PICC line.   We strive to give you quality time with your provider. You may need to reschedule your appointment if you arrive late (15 or more minutes).  Arriving late affects you and other patients whose appointments are after yours.  Also, if you miss three or more appointments without notifying the office, you may be dismissed from the clinic at the provider's discretion.      For prescription refill requests, have your pharmacy contact our office and allow 72 hours for refills to be completed.    Today you received the following chemotherapy and/or immunotherapy agents MVASI.  Bevacizumab Injection What is this medication? BEVACIZUMAB (be va SIZ yoo mab) treats some types of cancer. It works by blocking a protein that causes cancer cells to grow and multiply. This helps to slow or stop the spread of cancer cells. It is a monoclonal antibody. This medicine may be used for other purposes; ask your health care provider or pharmacist if you have questions. COMMON BRAND NAME(S): Alymsys, Avastin, MVASI, Omer Jack What should I tell my care team before I take this medication? They need to know if you have any of these conditions: Blood clots Coughing up blood Having or recent surgery Heart failure High blood pressure History of a connection between 2 or more body parts that do not usually connect (fistula) History of a tear in your stomach or intestines Protein in your urine An  unusual or allergic reaction to bevacizumab, other medications, foods, dyes, or preservatives Pregnant or trying to get pregnant Breast-feeding How should I use this medication? This medication is injected into a vein. It is given by your care team in a hospital or clinic setting. Talk to your care team the use of this medication in children. Special care may be needed. Overdosage: If you think you have taken too much of this medicine contact a poison control center or emergency room at once. NOTE: This medicine is only for you. Do not share this medicine with others. What if I miss a dose? Keep appointments for follow-up doses. It is important not to miss your dose. Call your care team if you are unable to keep an appointment. What may interact with this medication? Interactions are not expected. This list may not describe all possible interactions. Give your health care provider a list of all the medicines, herbs, non-prescription drugs, or dietary supplements you use. Also tell them if you smoke, drink alcohol, or use illegal drugs. Some items may interact with your medicine. What should I watch for while using this medication? Your condition will be monitored carefully while you are receiving this medication. You may need blood work while taking this medication. This medication may make you feel generally unwell. This is not uncommon as chemotherapy can affect healthy cells as well as cancer cells. Report any side effects. Continue your course of treatment even though you feel ill unless your care team tells you to stop. This  medication may increase your risk to bruise or bleed. Call your care team if you notice any unusual bleeding. Before having surgery, talk to your care team to make sure it is ok. This medication can increase the risk of poor healing of your surgical site or wound. You will need to stop this medication for 28 days before surgery. After surgery, wait at least 28 days before  restarting this medication. Make sure the surgical site or wound is healed enough before restarting this medication. Talk to your care team if questions. Talk to your care team if you may be pregnant. Serious birth defects can occur if you take this medication during pregnancy and for 6 months after the last dose. Contraception is recommended while taking this medication and for 6 months after the last dose. Your care team can help you find the option that works for you. Do not breastfeed while taking this medication and for 6 months after the last dose. This medication can cause infertility. Talk to your care team if you are concerned about your fertility. What side effects may I notice from receiving this medication? Side effects that you should report to your care team as soon as possible: Allergic reactions--skin rash, itching, hives, swelling of the face, lips, tongue, or throat Bleeding--bloody or black, tar-like stools, vomiting blood or Sante Biedermann material that looks like coffee grounds, red or dark Ashari Llewellyn urine, small red or purple spots on skin, unusual bruising or bleeding Blood clot--pain, swelling, or warmth in the leg, shortness of breath, chest pain Heart attack--pain or tightness in the chest, shoulders, arms, or jaw, nausea, shortness of breath, cold or clammy skin, feeling faint or lightheaded Heart failure--shortness of breath, swelling of the ankles, feet, or hands, sudden weight gain, unusual weakness or fatigue Increase in blood pressure Infection--fever, chills, cough, sore throat, wounds that don't heal, pain or trouble when passing urine, general feeling of discomfort or being unwell Infusion reactions--chest pain, shortness of breath or trouble breathing, feeling faint or lightheaded Kidney injury--decrease in the amount of urine, swelling of the ankles, hands, or feet Stomach pain that is severe, does not go away, or gets worse Stroke--sudden numbness or weakness of the face,  arm, or leg, trouble speaking, confusion, trouble walking, loss of balance or coordination, dizziness, severe headache, change in vision Sudden and severe headache, confusion, change in vision, seizures, which may be signs of posterior reversible encephalopathy syndrome (PRES) Side effects that usually do not require medical attention (report to your care team if they continue or are bothersome): Back pain Change in taste Diarrhea Dry skin Increased tears Nosebleed This list may not describe all possible side effects. Call your doctor for medical advice about side effects. You may report side effects to FDA at 1-800-FDA-1088. Where should I keep my medication? This medication is given in a hospital or clinic. It will not be stored at home. NOTE: This sheet is a summary. It may not cover all possible information. If you have questions about this medicine, talk to your doctor, pharmacist, or health care provider.  2024 Elsevier/Gold Standard (2021-10-06 00:00:00)       To help prevent nausea and vomiting after your treatment, we encourage you to take your nausea medication as directed.  BELOW ARE SYMPTOMS THAT SHOULD BE REPORTED IMMEDIATELY: *FEVER GREATER THAN 100.4 F (38 C) OR HIGHER *CHILLS OR SWEATING *NAUSEA AND VOMITING THAT IS NOT CONTROLLED WITH YOUR NAUSEA MEDICATION *UNUSUAL SHORTNESS OF BREATH *UNUSUAL BRUISING OR BLEEDING *URINARY PROBLEMS (pain or  burning when urinating, or frequent urination) *BOWEL PROBLEMS (unusual diarrhea, constipation, pain near the anus) TENDERNESS IN MOUTH AND THROAT WITH OR WITHOUT PRESENCE OF ULCERS (sore throat, sores in mouth, or a toothache) UNUSUAL RASH, SWELLING OR PAIN  UNUSUAL VAGINAL DISCHARGE OR ITCHING   Items with * indicate a potential emergency and should be followed up as soon as possible or go to the Emergency Department if any problems should occur.  Please show the CHEMOTHERAPY ALERT CARD or IMMUNOTHERAPY ALERT CARD at check-in  to the Emergency Department and triage nurse.  Should you have questions after your visit or need to cancel or reschedule your appointment, please contact [facility] [phone]  and follow the prompts.  Office hours are 8:00 a.m. to 4:30 p.m. Monday - Friday. Please note that voicemails left after 4:00 p.m. may not be returned until the following business day.  We are closed weekends and major holidays. You have access to a nurse at all times for urgent questions. Please call the main number to the clinic [phone] and follow the prompts.  For any non-urgent questions, you may also contact your provider using MyChart. We now offer e-Visits for anyone 73 and older to request care online for non-urgent symptoms. For details visit [email or web address].   Also download the MyChart app! Go to the app store, search "MyChart", open the app, select [place], and log in with your MyChart username and password.

## 2023-02-18 NOTE — Patient Instructions (Signed)
[facility] at [facility] Discharge Instructions   You were seen and examined today by Dr. Ellin Saba.  He reviewed the results of your lab work which are normal/stable.   We will proceed with your treatment today.  Return as scheduled.    Thank you for choosing [facility] at [facility] to provide your oncology and hematology care.  To afford each patient quality time with our provider, please arrive at least 15 minutes before your scheduled appointment time.   If you have a lab appointment with the Cancer Center please come in thru the Main Entrance and check in at the main information desk.  You need to re-schedule your appointment should you arrive 10 or more minutes late.  We strive to give you quality time with our providers, and arriving late affects you and other patients whose appointments are after yours.  Also, if you no show three or more times for appointments you may be dismissed from the clinic at the providers discretion.     Again, thank you for choosing [facility].  Our hope is that these requests will decrease the amount of time that you wait before being seen by our physicians.       _____________________________________________________________  Should you have questions after your visit to [facility], please contact our office at [phone] and follow the prompts.  Our office hours are 8:00 a.m. and 4:30 p.m. Monday - Friday.  Please note that voicemails left after 4:00 p.m. may not be returned until the following business day.  We are closed weekends and major holidays.  You do have access to a nurse 24-7, just call the main number to the clinic [phone] and do not press any options, hold on the line and a nurse will answer the phone.    For prescription refill requests, have your pharmacy contact our office and allow 72 hours.    Due to Covid, you will need to wear a mask upon entering  the hospital. If you do not have a mask, a mask will be given to you at the Main Entrance upon arrival. For doctor visits, patients may have 1 support person age 36 or older with them. For treatment visits, patients can not have anyone with them due to social distancing guidelines and our immunocompromised population.

## 2023-02-18 NOTE — Progress Notes (Signed)
Patient presents today for chemotherapy infusion. Patient is in satisfactory condition with no new complaints voiced.  Vital signs are stable.  Labs reviewed by Dr. Ellin Saba during the office visit and all labs are within treatment parameters.  Urine protein is within treatment parameters.  We will proceed with treatment per MD orders.   Patient tolerated treatment well with no complaints voiced.  Patient left ambulatory in stable condition.  Vital signs stable at discharge.  Follow up as scheduled.

## 2023-02-18 NOTE — Progress Notes (Signed)
Patient has been examined by Dr. Ellin Saba. Vital signs and labs have been reviewed by MD - ANC, Creatinine, LFTs, hemoglobin, and platelets are within treatment parameters per M.D. - pt may proceed with treatment.  Primary RN and pharmacy notified.

## 2023-02-19 LAB — CA 125: Cancer Antigen (CA) 125: 11.7 U/mL (ref 0.0–38.1)

## 2023-02-20 ENCOUNTER — Telehealth: Payer: Self-pay | Admitting: Gynecologic Oncology

## 2023-02-20 NOTE — Telephone Encounter (Signed)
Called patient, discussed latest CT results. All questions answered.  Eugene Garnet MD Gynecologic Oncology

## 2023-03-11 ENCOUNTER — Inpatient Hospital Stay: Payer: Commercial Managed Care - PPO

## 2023-03-11 ENCOUNTER — Inpatient Hospital Stay: Payer: Commercial Managed Care - PPO | Attending: Gynecologic Oncology

## 2023-03-11 VITALS — BP 144/80 | HR 102 | Temp 97.2°F | Resp 20 | Wt 201.5 lb

## 2023-03-11 VITALS — BP 120/62 | HR 90 | Temp 97.5°F | Resp 20

## 2023-03-11 DIAGNOSIS — Z5112 Encounter for antineoplastic immunotherapy: Secondary | ICD-10-CM | POA: Diagnosis present

## 2023-03-11 DIAGNOSIS — Z79899 Other long term (current) drug therapy: Secondary | ICD-10-CM | POA: Insufficient documentation

## 2023-03-11 DIAGNOSIS — C569 Malignant neoplasm of unspecified ovary: Secondary | ICD-10-CM

## 2023-03-11 DIAGNOSIS — C482 Malignant neoplasm of peritoneum, unspecified: Secondary | ICD-10-CM | POA: Diagnosis present

## 2023-03-11 DIAGNOSIS — Z95828 Presence of other vascular implants and grafts: Secondary | ICD-10-CM

## 2023-03-11 LAB — COMPREHENSIVE METABOLIC PANEL
ALT: 14 U/L (ref 0–44)
AST: 19 U/L (ref 15–41)
Albumin: 3.2 g/dL — ABNORMAL LOW (ref 3.5–5.0)
Alkaline Phosphatase: 51 U/L (ref 38–126)
Anion gap: 8 (ref 5–15)
BUN: 15 mg/dL (ref 6–20)
CO2: 26 mmol/L (ref 22–32)
Calcium: 8.6 mg/dL — ABNORMAL LOW (ref 8.9–10.3)
Chloride: 104 mmol/L (ref 98–111)
Creatinine, Ser: 0.77 mg/dL (ref 0.44–1.00)
GFR, Estimated: >60 mL/min (ref >60)
Glucose, Bld: 134 mg/dL — ABNORMAL HIGH (ref 70–99)
Potassium: 3.8 mmol/L (ref 3.5–5.1)
Sodium: 138 mmol/L (ref 135–145)
Total Bilirubin: 0.8 mg/dL (ref 0.3–1.2)
Total Protein: 6.4 g/dL — ABNORMAL LOW (ref 6.5–8.1)

## 2023-03-11 LAB — CBC WITH DIFFERENTIAL/PLATELET
Abs Immature Granulocytes: 0.02 10*3/uL (ref 0.00–0.07)
Basophils Absolute: 0 10*3/uL (ref 0.0–0.1)
Basophils Relative: 0 %
Eosinophils Absolute: 0.1 10*3/uL (ref 0.0–0.5)
Eosinophils Relative: 2 %
HCT: 30.3 % — ABNORMAL LOW (ref 36.0–46.0)
Hemoglobin: 10.1 g/dL — ABNORMAL LOW (ref 12.0–15.0)
Immature Granulocytes: 0 %
Lymphocytes Relative: 25 %
Lymphs Abs: 1.3 10*3/uL (ref 0.7–4.0)
MCH: 36.7 pg — ABNORMAL HIGH (ref 26.0–34.0)
MCHC: 33.3 g/dL (ref 30.0–36.0)
MCV: 110.2 fL — ABNORMAL HIGH (ref 80.0–100.0)
Monocytes Absolute: 0.3 10*3/uL (ref 0.1–1.0)
Monocytes Relative: 7 %
Neutro Abs: 3.4 10*3/uL (ref 1.7–7.7)
Neutrophils Relative %: 66 %
Platelets: 130 10*3/uL — ABNORMAL LOW (ref 150–400)
RBC Morphology: NONE SEEN
RBC: 2.75 MIL/uL — ABNORMAL LOW (ref 3.87–5.11)
RDW: 17.9 % — ABNORMAL HIGH (ref 11.5–15.5)
WBC: 5.2 10*3/uL (ref 4.0–10.5)
nRBC: 0.6 % — ABNORMAL HIGH (ref 0.0–0.2)

## 2023-03-11 LAB — MAGNESIUM: Magnesium: 1.7 mg/dL (ref 1.7–2.4)

## 2023-03-11 MED ORDER — SODIUM CHLORIDE 0.9 % IV SOLN: Freq: Once | INTRAVENOUS | Status: AC

## 2023-03-11 MED ORDER — SODIUM CHLORIDE 0.9 % IV SOLN
15.0000 mg/kg | Freq: Once | INTRAVENOUS | Status: AC
  Administered 2023-03-11: 1400 mg via INTRAVENOUS
  Filled 2023-03-11: qty 48

## 2023-03-11 MED ORDER — SODIUM CHLORIDE 0.9% FLUSH
10.0000 mL | INTRAVENOUS | Status: DC | PRN
  Administered 2023-03-11: 10 mL via INTRAVENOUS

## 2023-03-11 MED ORDER — SODIUM CHLORIDE 0.9% FLUSH
10.0000 mL | INTRAVENOUS | Status: DC | PRN
  Administered 2023-03-11: 10 mL

## 2023-03-11 MED ORDER — HEPARIN SOD (PORK) LOCK FLUSH 100 UNIT/ML IV SOLN
500.0000 [IU] | Freq: Once | INTRAVENOUS | Status: AC | PRN
  Administered 2023-03-11: 500 [IU]

## 2023-03-11 NOTE — Progress Notes (Signed)
Patients port flushed without difficulty.  Good blood return noted with no bruising or swelling noted at site.  Patient remains accessed for treatment.  

## 2023-03-11 NOTE — Patient Instructions (Signed)
[facility]  Discharge Instructions: Thank you for choosing [facility] to provide your oncology and hematology care.  If you have a lab appointment with the Cancer Center - please note that after April 8th, 2024, all labs will be drawn in the cancer center.  You do not have to check in or register with the main entrance as you have in the past but will complete your check-in in the cancer center.  Wear comfortable clothing and clothing appropriate for easy access to any Portacath or PICC line.   We strive to give you quality time with your provider. You may need to reschedule your appointment if you arrive late (15 or more minutes).  Arriving late affects you and other patients whose appointments are after yours.  Also, if you miss three or more appointments without notifying the office, you may be dismissed from the clinic at the provider's discretion.      For prescription refill requests, have your pharmacy contact our office and allow 72 hours for refills to be completed.    Today you received the following chemotherapy and/or immunotherapy agents MVASI.  Bevacizumab Injection What is this medication? BEVACIZUMAB (be va SIZ yoo mab) treats some types of cancer. It works by blocking a protein that causes cancer cells to grow and multiply. This helps to slow or stop the spread of cancer cells. It is a monoclonal antibody. This medicine may be used for other purposes; ask your health care provider or pharmacist if you have questions. COMMON BRAND NAME(S): Alymsys, Avastin, MVASI, Omer Jack What should I tell my care team before I take this medication? They need to know if you have any of these conditions: Blood clots Coughing up blood Having or recent surgery Heart failure High blood pressure History of a connection between 2 or more body parts that do not usually connect (fistula) History of a tear in your stomach or intestines Protein in your urine An  unusual or allergic reaction to bevacizumab, other medications, foods, dyes, or preservatives Pregnant or trying to get pregnant Breast-feeding How should I use this medication? This medication is injected into a vein. It is given by your care team in a hospital or clinic setting. Talk to your care team the use of this medication in children. Special care may be needed. Overdosage: If you think you have taken too much of this medicine contact a poison control center or emergency room at once. NOTE: This medicine is only for you. Do not share this medicine with others. What if I miss a dose? Keep appointments for follow-up doses. It is important not to miss your dose. Call your care team if you are unable to keep an appointment. What may interact with this medication? Interactions are not expected. This list may not describe all possible interactions. Give your health care provider a list of all the medicines, herbs, non-prescription drugs, or dietary supplements you use. Also tell them if you smoke, drink alcohol, or use illegal drugs. Some items may interact with your medicine. What should I watch for while using this medication? Your condition will be monitored carefully while you are receiving this medication. You may need blood work while taking this medication. This medication may make you feel generally unwell. This is not uncommon as chemotherapy can affect healthy cells as well as cancer cells. Report any side effects. Continue your course of treatment even though you feel ill unless your care team tells you to stop. This  medication may increase your risk to bruise or bleed. Call your care team if you notice any unusual bleeding. Before having surgery, talk to your care team to make sure it is ok. This medication can increase the risk of poor healing of your surgical site or wound. You will need to stop this medication for 28 days before surgery. After surgery, wait at least 28 days before  restarting this medication. Make sure the surgical site or wound is healed enough before restarting this medication. Talk to your care team if questions. Talk to your care team if you may be pregnant. Serious birth defects can occur if you take this medication during pregnancy and for 6 months after the last dose. Contraception is recommended while taking this medication and for 6 months after the last dose. Your care team can help you find the option that works for you. Do not breastfeed while taking this medication and for 6 months after the last dose. This medication can cause infertility. Talk to your care team if you are concerned about your fertility. What side effects may I notice from receiving this medication? Side effects that you should report to your care team as soon as possible: Allergic reactions--skin rash, itching, hives, swelling of the face, lips, tongue, or throat Bleeding--bloody or black, tar-like stools, vomiting blood or Sante Biedermann material that looks like coffee grounds, red or dark Ashari Llewellyn urine, small red or purple spots on skin, unusual bruising or bleeding Blood clot--pain, swelling, or warmth in the leg, shortness of breath, chest pain Heart attack--pain or tightness in the chest, shoulders, arms, or jaw, nausea, shortness of breath, cold or clammy skin, feeling faint or lightheaded Heart failure--shortness of breath, swelling of the ankles, feet, or hands, sudden weight gain, unusual weakness or fatigue Increase in blood pressure Infection--fever, chills, cough, sore throat, wounds that don't heal, pain or trouble when passing urine, general feeling of discomfort or being unwell Infusion reactions--chest pain, shortness of breath or trouble breathing, feeling faint or lightheaded Kidney injury--decrease in the amount of urine, swelling of the ankles, hands, or feet Stomach pain that is severe, does not go away, or gets worse Stroke--sudden numbness or weakness of the face,  arm, or leg, trouble speaking, confusion, trouble walking, loss of balance or coordination, dizziness, severe headache, change in vision Sudden and severe headache, confusion, change in vision, seizures, which may be signs of posterior reversible encephalopathy syndrome (PRES) Side effects that usually do not require medical attention (report to your care team if they continue or are bothersome): Back pain Change in taste Diarrhea Dry skin Increased tears Nosebleed This list may not describe all possible side effects. Call your doctor for medical advice about side effects. You may report side effects to FDA at 1-800-FDA-1088. Where should I keep my medication? This medication is given in a hospital or clinic. It will not be stored at home. NOTE: This sheet is a summary. It may not cover all possible information. If you have questions about this medicine, talk to your doctor, pharmacist, or health care provider.  2024 Elsevier/Gold Standard (2021-10-06 00:00:00)       To help prevent nausea and vomiting after your treatment, we encourage you to take your nausea medication as directed.  BELOW ARE SYMPTOMS THAT SHOULD BE REPORTED IMMEDIATELY: *FEVER GREATER THAN 100.4 F (38 C) OR HIGHER *CHILLS OR SWEATING *NAUSEA AND VOMITING THAT IS NOT CONTROLLED WITH YOUR NAUSEA MEDICATION *UNUSUAL SHORTNESS OF BREATH *UNUSUAL BRUISING OR BLEEDING *URINARY PROBLEMS (pain or  burning when urinating, or frequent urination) *BOWEL PROBLEMS (unusual diarrhea, constipation, pain near the anus) TENDERNESS IN MOUTH AND THROAT WITH OR WITHOUT PRESENCE OF ULCERS (sore throat, sores in mouth, or a toothache) UNUSUAL RASH, SWELLING OR PAIN  UNUSUAL VAGINAL DISCHARGE OR ITCHING   Items with * indicate a potential emergency and should be followed up as soon as possible or go to the Emergency Department if any problems should occur.  Please show the CHEMOTHERAPY ALERT CARD or IMMUNOTHERAPY ALERT CARD at check-in  to the Emergency Department and triage nurse.  Should you have questions after your visit or need to cancel or reschedule your appointment, please contact [facility] [phone]  and follow the prompts.  Office hours are 8:00 a.m. to 4:30 p.m. Monday - Friday. Please note that voicemails left after 4:00 p.m. may not be returned until the following business day.  We are closed weekends and major holidays. You have access to a nurse at all times for urgent questions. Please call the main number to the clinic [phone] and follow the prompts.  For any non-urgent questions, you may also contact your provider using MyChart. We now offer e-Visits for anyone 73 and older to request care online for non-urgent symptoms. For details visit [email or web address].   Also download the MyChart app! Go to the app store, search "MyChart", open the app, select [place], and log in with your MyChart username and password.

## 2023-03-11 NOTE — Progress Notes (Signed)
Labs reviewed, ok to treat per treatment parameters.   Treatment given per orders. Patient tolerated it well without problems. Vitals stable and discharged home from clinic ambulatory. Follow up as scheduled.

## 2023-03-11 NOTE — Progress Notes (Signed)
MVASI given today per MD orders. Tolerated infusion without adverse affects. Vital signs stable. No complaints at this time. Discharged from clinic ambulatory in stable condition. Alert and oriented x 3. F/U with [facility] as scheduled.

## 2023-03-13 ENCOUNTER — Other Ambulatory Visit: Payer: Self-pay | Admitting: Hematology

## 2023-03-19 ENCOUNTER — Encounter: Payer: Self-pay | Admitting: Family Medicine

## 2023-03-22 ENCOUNTER — Encounter: Payer: Commercial Managed Care - PPO | Admitting: Family Medicine

## 2023-03-29 ENCOUNTER — Ambulatory Visit (INDEPENDENT_AMBULATORY_CARE_PROVIDER_SITE_OTHER): Payer: Commercial Managed Care - PPO | Admitting: Family Medicine

## 2023-03-29 ENCOUNTER — Encounter: Payer: Self-pay | Admitting: Family Medicine

## 2023-03-29 VITALS — BP 138/85 | HR 100 | Temp 98.5°F | Ht 62.0 in | Wt 201.0 lb

## 2023-03-29 DIAGNOSIS — Z0001 Encounter for general adult medical examination with abnormal findings: Secondary | ICD-10-CM | POA: Diagnosis not present

## 2023-03-29 DIAGNOSIS — Z0000 Encounter for general adult medical examination without abnormal findings: Secondary | ICD-10-CM

## 2023-03-29 DIAGNOSIS — Z86711 Personal history of pulmonary embolism: Secondary | ICD-10-CM | POA: Diagnosis not present

## 2023-03-29 DIAGNOSIS — E1169 Type 2 diabetes mellitus with other specified complication: Secondary | ICD-10-CM | POA: Diagnosis not present

## 2023-03-29 DIAGNOSIS — E785 Hyperlipidemia, unspecified: Secondary | ICD-10-CM

## 2023-03-29 DIAGNOSIS — C482 Malignant neoplasm of peritoneum, unspecified: Secondary | ICD-10-CM

## 2023-03-29 LAB — BAYER DCA HB A1C WAIVED: HB A1C (BAYER DCA - WAIVED): 5.6 % (ref 4.8–5.6)

## 2023-03-29 NOTE — Progress Notes (Signed)
Nicole Santos is a 52 y.o. female presents to office today for annual physical exam examination.    Concerns today include: 1. Type 2 Diabetes with hypertension, hyperlipidemia:  She reports compliance with Lipitor.  Not currently treated with any blood pressure medication or diabetes medicine as this has been diet controlled for her.  However she does admit now that she can taste food again she has been eating more than she had normally been.  She worries about her A1c being up a little bit today.  Last eye exam: Needs Last foot exam: Up-to-date Last A1c:  Lab Results  Component Value Date   HGBA1C 5.5 09/18/2022   Nephropathy screen indicated?:  Up-to-date Last flu, zoster and/or pneumovax:  There is no immunization history on file for this patient.  ROS: No chest pain, shortness of breath, blurred vision reported.  She continues to have neuropathy of the feet but is continuing to be treated with chemotherapy both as an oral and infusion.  She is on Cymbalta and tried Lyrica in the past but the Lyrica did not agree with her.  The Cymbalta does not seem to be doing much.  She continues to have numbness in her feet.  Health Maintenance Due  Topic Date Due   COVID-19 Vaccine (1) Never done   OPHTHALMOLOGY EXAM  Never done   DTaP/Tdap/Td (1 - Tdap) Never done   Zoster Vaccines- Shingrix (1 of 2) Never done   MAMMOGRAM  03/02/2023   HEMOGLOBIN A1C  03/20/2023   Refills needed today: none   There is no immunization history on file for this patient. Past Medical History:  Diagnosis Date   Acute respiratory disease due to COVID-19 virus 02/02/2019   Anxiety    Cancer (HCC)    Depression    Dyspnea    Dysrhythmia    Family history of bladder cancer    Family history of breast cancer    History of diabetes mellitus    Resolved in Sept 2023 with weight loss.   Hypertension    Ovarian cancer (HCC) 11/14/2021   Peritoneal carcinomatosis (HCC)    Pneumonia    Sleep apnea     doesn't use CPAP   Tachycardia    Social History   Socioeconomic History   Marital status: Married    Spouse name: Not on file   Number of children: Not on file   Years of education: Not on file   Highest education level: Not on file  Occupational History   Occupation: Unemployed  Tobacco Use   Smoking status: Never   Smokeless tobacco: Never  Vaping Use   Vaping status: Never Used  Substance and Sexual Activity   Alcohol use: No   Drug use: No   Sexual activity: Not Currently  Other Topics Concern   Not on file  Social History Narrative   Not on file   Social Determinants of Health   Financial Resource Strain: Not on file  Food Insecurity: No Food Insecurity (03/06/2022)   Hunger Vital Sign    Worried About Running Out of Food in the Last Year: Never true    Ran Out of Food in the Last Year: Never true  Transportation Needs: No Transportation Needs (03/06/2022)   PRAPARE - Administrator, Civil Service (Medical): No    Lack of Transportation (Non-Medical): No  Physical Activity: Not on file  Stress: Not on file  Social Connections: Not on file  Intimate Partner Violence: Not At  Risk (03/06/2022)   Humiliation, Afraid, Rape, and Kick questionnaire    Fear of Current or Ex-Partner: No    Emotionally Abused: No    Physically Abused: No    Sexually Abused: No   Past Surgical History:  Procedure Laterality Date   KNEE SURGERY Right    Arthroscopic   LAPAROSCOPIC ABDOMINAL EXPLORATION N/A 11/13/2021   Procedure: Exploratory laparoscopy;  Surgeon: Franky Macho, MD;  Location: [facility];  Service: General;  Laterality: N/A;   PORTACATH PLACEMENT Left 11/15/2021   Procedure: INSERTION PORT-A-CATH;  Surgeon: Franky Macho, MD;  Location: [facility];  Service: General;  Laterality: Left;   TONSILLECTOMY AND ADENOIDECTOMY     Family History  Problem Relation Age of Onset   Hypertension Mother    Heart attack Paternal Uncle    Bladder Cancer Maternal Grandmother 23        non-smoker   Dementia Maternal Grandfather    Breast cancer Maternal Great-grandmother        MGF's mother   Colon cancer Neg Hx    Ovarian cancer Neg Hx    Endometrial cancer Neg Hx    Pancreatic cancer Neg Hx    Prostate cancer Neg Hx     Current Outpatient Medications:    apixaban (ELIQUIS) 5 MG TABS tablet, Take 1 tablet (5 mg total) by mouth 2 (two) times daily., Disp: 180 tablet, Rfl: 3   atorvastatin (LIPITOR) 20 MG tablet, Take 1 tablet (20 mg total) by mouth at bedtime., Disp: 90 tablet, Rfl: 3   diphenoxylate-atropine (LOMOTIL) 2.5-0.025 MG tablet, Take 2 tablets by mouth after the first watery bowel movement. May then take 1 pill by mouth after each watery bowel movement. Do not exceed 8 pills in a 24 hour period., Disp: 60 tablet, Rfl: 3   DULoxetine (CYMBALTA) 30 MG capsule, TAKE 1 CAPSULE BY MOUTH DAILY FOR 7 DAYS, THEN INCREASE TO 2 CAPSULES DAILY, Disp: 60 capsule, Rfl: 5   lidocaine-prilocaine (EMLA) cream, Apply a quarter sized amount to port a cath site and cover with plastic wrap 1 hour prior to infusion appointments, Disp: 30 g, Rfl: 3   magnesium oxide (MAG-OX) 400 (240 Mg) MG tablet, TAKE 1 TABLET BY MOUTH TWICE A DAY, Disp: 60 tablet, Rfl: 2   Multiple Vitamin (MULTIVITAMIN) capsule, Take 1 capsule by mouth daily., Disp: , Rfl:    olaparib (LYNPARZA) 150 MG tablet, Take 2 tablets (300 mg total) by mouth 2 (two) times daily. Swallow whole. May take with food to decrease nausea and vomiting., Disp: 120 tablet, Rfl: 3   Potassium 99 MG TABS, Take 99 mg by mouth at bedtime., Disp: , Rfl:    pregabalin (LYRICA) 100 MG capsule, Take 100 mg by mouth 2 (two) times daily., Disp: , Rfl:    prochlorperazine (COMPAZINE) 10 MG tablet, Take 1 tablet (10 mg total) by mouth every 6 (six) hours as needed for nausea or vomiting., Disp: 30 tablet, Rfl: 3 No current facility-administered medications for this visit.  Facility-Administered Medications Ordered in Other Visits:     heparin lock flush 100 unit/mL, 500 Units, Intravenous, Once, [name], MD   sodium chloride flush (NS) 0.9 % injection 10 mL, 10 mL, Intravenous, PRN, [name], MD  Allergies  Allergen Reactions   Augmentin [Amoxicillin-Pot Clavulanate] Diarrhea and Nausea And Vomiting   Doxycycline     Abdominal cramps.   Topamax [Topiramate] Nausea And Vomiting     ROS: Review of Systems Pertinent items noted in HPI and remainder  of comprehensive ROS otherwise negative.    Physical exam BP 138/85   Pulse 100   Temp 98.5 F (36.9 C)   Ht 5\' 2"  (1.575 m)   Wt 201 lb (91.2 kg)   SpO2 96%   BMI 36.76 kg/m  General appearance: alert, cooperative, appears stated age, no distress, and moderately obese Head: Normocephalic, without obvious abnormality, atraumatic Eyes: negative findings: lids and lashes normal, conjunctivae and sclerae normal, corneas clear, and pupils equal, round, reactive to light and accomodation Ears: normal TM's and external ear canals both ears Nose: Nares normal. Septum midline. Mucosa normal. No drainage or sinus tenderness. Throat: lips, mucosa, and tongue normal; teeth and gums normal Neck: no adenopathy, no carotid bruit, supple, symmetrical, trachea midline, and thyroid not enlarged, symmetric, no tenderness/mass/nodules Back: symmetric, no curvature. ROM normal. No CVA tenderness. Lungs: clear to auscultation bilaterally Heart: regular rate and rhythm, S1, S2 normal, no murmur, click, rub or gallop Abdomen: soft, non-tender; bowel sounds normal; no masses,  no organomegaly Extremities: extremities normal, atraumatic, no cyanosis or edema Pulses: 2+ and symmetric Skin:  Skin tags noted along the lateral aspect of the neck Lymph nodes: Cervical, supraclavicular, and axillary nodes normal. Neurologic: Grossly normal    Assessment/ Plan: Nicole Santos here for annual physical exam.   Annual physical exam  Type 2 diabetes mellitus with other  specified complication, without long-term current use of insulin (HCC) - Plan: Bayer DCA Hb A1c Waived  Hyperlipidemia associated with type 2 diabetes mellitus (HCC) - Plan: Lipid Panel  Primary peritoneal carcinomatosis (HCC) - Plan: VITAMIN D 25 Hydroxy (Vit-D Deficiency, Fractures)  History of pulmonary embolus (PE)  Morbid obesity (HCC) - Plan: VITAMIN D 25 Hydroxy (Vit-D Deficiency, Fractures)  Sugar remains diet controlled with A1c of 5.6 today.  We will get her set up for diabetic eye exam here in office.  Lipid panel, vitamin D level ordered today.  Continue to follow-up with oncology as directed.  With regards to her neuropathy I wonder if she might be a good candidate for alpha lipoic acid.  I will CC oncology to see if this might be addressed.  If eligible for OTC, recommend 600 mg daily to see if perhaps this might improve some of the symptomology she is experiencing in her feet  She will continue Eliquis.  No difficulty affording medication at this time.  Counseled on healthy lifestyle choices, including diet (rich in fruits, vegetables and lean meats and low in salt and simple carbohydrates) and exercise (at least 30 minutes of moderate physical activity daily).  Patient to follow up 4m for DM given small rise in sugar.  Michaelene Dutan M. Nadine Counts, DO

## 2023-03-30 ENCOUNTER — Other Ambulatory Visit: Payer: Self-pay

## 2023-03-30 LAB — LIPID PANEL
Chol/HDL Ratio: 2.5 ratio (ref 0.0–4.4)
Cholesterol, Total: 135 mg/dL (ref 100–199)
HDL: 55 mg/dL (ref >39)
LDL Chol Calc (NIH): 65 mg/dL (ref 0–99)
Triglycerides: 76 mg/dL (ref 0–149)
VLDL Cholesterol Cal: 15 mg/dL (ref 5–40)

## 2023-03-30 LAB — VITAMIN D 25 HYDROXY (VIT D DEFICIENCY, FRACTURES): Vit D, 25-Hydroxy: 39.3 ng/mL (ref 30.0–100.0)

## 2023-04-01 ENCOUNTER — Inpatient Hospital Stay (HOSPITAL_BASED_OUTPATIENT_CLINIC_OR_DEPARTMENT_OTHER): Payer: Commercial Managed Care - PPO | Admitting: Hematology

## 2023-04-01 ENCOUNTER — Inpatient Hospital Stay: Payer: Commercial Managed Care - PPO

## 2023-04-01 VITALS — BP 110/53 | HR 94 | Temp 98.1°F | Resp 16

## 2023-04-01 VITALS — BP 143/87 | HR 105 | Temp 97.6°F | Resp 19 | Ht 62.0 in | Wt 202.8 lb

## 2023-04-01 DIAGNOSIS — C569 Malignant neoplasm of unspecified ovary: Secondary | ICD-10-CM

## 2023-04-01 DIAGNOSIS — C482 Malignant neoplasm of peritoneum, unspecified: Secondary | ICD-10-CM | POA: Diagnosis not present

## 2023-04-01 DIAGNOSIS — Z5112 Encounter for antineoplastic immunotherapy: Secondary | ICD-10-CM | POA: Diagnosis not present

## 2023-04-01 DIAGNOSIS — Z95828 Presence of other vascular implants and grafts: Secondary | ICD-10-CM

## 2023-04-01 LAB — COMPREHENSIVE METABOLIC PANEL
ALT: 17 U/L (ref 0–44)
AST: 22 U/L (ref 15–41)
Albumin: 3.4 g/dL — ABNORMAL LOW (ref 3.5–5.0)
Alkaline Phosphatase: 54 U/L (ref 38–126)
Anion gap: 8 (ref 5–15)
BUN: 17 mg/dL (ref 6–20)
CO2: 28 mmol/L (ref 22–32)
Calcium: 8.6 mg/dL — ABNORMAL LOW (ref 8.9–10.3)
Chloride: 102 mmol/L (ref 98–111)
Creatinine, Ser: 0.81 mg/dL (ref 0.44–1.00)
GFR, Estimated: >60 mL/min (ref >60)
Glucose, Bld: 131 mg/dL — ABNORMAL HIGH (ref 70–99)
Potassium: 3.4 mmol/L — ABNORMAL LOW (ref 3.5–5.1)
Sodium: 138 mmol/L (ref 135–145)
Total Bilirubin: 1 mg/dL (ref 0.3–1.2)
Total Protein: 6.4 g/dL — ABNORMAL LOW (ref 6.5–8.1)

## 2023-04-01 LAB — CBC WITH DIFFERENTIAL/PLATELET
Abs Immature Granulocytes: 0.03 10*3/uL (ref 0.00–0.07)
Basophils Absolute: 0 10*3/uL (ref 0.0–0.1)
Basophils Relative: 1 %
Eosinophils Absolute: 0.1 10*3/uL (ref 0.0–0.5)
Eosinophils Relative: 2 %
HCT: 31.1 % — ABNORMAL LOW (ref 36.0–46.0)
Hemoglobin: 10.5 g/dL — ABNORMAL LOW (ref 12.0–15.0)
Immature Granulocytes: 1 %
Lymphocytes Relative: 21 %
Lymphs Abs: 1.1 10*3/uL (ref 0.7–4.0)
MCH: 38 pg — ABNORMAL HIGH (ref 26.0–34.0)
MCHC: 33.8 g/dL (ref 30.0–36.0)
MCV: 112.7 fL — ABNORMAL HIGH (ref 80.0–100.0)
Monocytes Absolute: 0.4 10*3/uL (ref 0.1–1.0)
Monocytes Relative: 7 %
Neutro Abs: 3.8 10*3/uL (ref 1.7–7.7)
Neutrophils Relative %: 68 %
Platelets: 153 10*3/uL (ref 150–400)
RBC: 2.76 MIL/uL — ABNORMAL LOW (ref 3.87–5.11)
RDW: 18.2 % — ABNORMAL HIGH (ref 11.5–15.5)
WBC: 5.5 10*3/uL (ref 4.0–10.5)
nRBC: 1.1 % — ABNORMAL HIGH (ref 0.0–0.2)

## 2023-04-01 LAB — MAGNESIUM: Magnesium: 1.7 mg/dL (ref 1.7–2.4)

## 2023-04-01 MED ORDER — SODIUM CHLORIDE 0.9 % IV SOLN
15.0000 mg/kg | Freq: Once | INTRAVENOUS | Status: AC
  Administered 2023-04-01: 1400 mg via INTRAVENOUS
  Filled 2023-04-01: qty 48

## 2023-04-01 MED ORDER — SODIUM CHLORIDE 0.9 % IV SOLN: Freq: Once | INTRAVENOUS | Status: AC

## 2023-04-01 MED ORDER — SODIUM CHLORIDE 0.9% FLUSH
10.0000 mL | INTRAVENOUS | Status: DC | PRN
  Administered 2023-04-01: 10 mL

## 2023-04-01 MED ORDER — SODIUM CHLORIDE 0.9% FLUSH
10.0000 mL | INTRAVENOUS | Status: DC | PRN
  Administered 2023-04-01: 10 mL via INTRAVENOUS

## 2023-04-01 MED ORDER — HEPARIN SOD (PORK) LOCK FLUSH 100 UNIT/ML IV SOLN
500.0000 [IU] | Freq: Once | INTRAVENOUS | Status: AC | PRN
  Administered 2023-04-01: 500 [IU]

## 2023-04-01 NOTE — Progress Notes (Signed)
Patient presents today for MVASI, patient okay for treatment per Dr. Ellin Saba. Patient tolerated chemotherapy with no complaints voiced. Side effects with management reviewed understanding verbalized. Port site clean and dry with no bruising or swelling noted at site. Good blood return noted before and after administration of chemotherapy. Band aid applied. Patient left in satisfactory condition with VSS and no s/s of distress noted.

## 2023-04-01 NOTE — Progress Notes (Signed)
[facility] [address], [name] [address]    Clinic Day:  04/01/2023  Referring physician: [name], DO  Patient Care Team: [name], DO as PCP - General (Family Medicine) [name], MD as Referring Physician (Optometry) [name], MD as Medical Oncologist (Medical Oncology) [name], RN as Oncology Nurse Navigator (Medical Oncology)   ASSESSMENT & PLAN:   Assessment: 1. High-grade serous carcinoma of peritoneum: - She had developed sharp left-sided abdominal pain since Mother's Day, occasional radiating to the back.  40 pound weight loss in the last 6 months. - Personal history of cervical cancer in 2000, status post chemo plus XRT at [facility].  She has slightly decreased hearing in the left ear since then. - US abdomen (11/08/2021): Cholelithiasis with gallbladder wall thickening and sonographic Murphy sign.  Upper abdominal ascites. - Laparoscopy (11/13/2021): Starting of the omentum as well as the upper abdominal wall and diaphragm.  Liver was within normal limits.  6 L of ascites was evacuated.  Omental and peritoneal biopsies were sent.  Peritoneal implants throughout and a significant portion of omentum and on the small intestine. - CT CAP (11/13/2021): Ascites, extensive peritoneal thickening, nodularity and omental caking.  No evidence of primary malignancy in the chest, abdomen or pelvis.  Small left pleural effusion, nonspecific.  Cholelithiasis. - US pelvis (11/14/2021): Uterus is grossly unremarkable.  Ovaries not visualized. - CA125 (11/13/2021): 1269 - Pathology: Peritoneum and omental biopsy consistent with high-grade serous carcinoma.  HER2 2+ by IHC and negative by FISH. - 4 cycles of carboplatin and paclitaxel from 11/21/2021 through 01/23/2022, cycle 9 on 08/29/2022. - 10/01/2022: Diagnostic laparoscopy, exploratory laparotomy, omentectomy - Pathology: Right pelvic sidewall excision-high-grade  serous carcinoma, omental resection-high-grade serous carcinoma with treatment effect present.  Small bowel implant excision was negative for carcinoma. - Germline mutation testing negative - Foundation 1 NGS (10/01/2022): LOH-18.9%, HRD +, MS-stable, TMB-low - Olaparib 300 mg twice daily and bevacizumab maintenance started on 01/07/2023  2. Social/family history: - Lives at home with her husband.  Worked in Engineering geologist at [employer].  Non-smoker. - Maternal grandmother with bladder cancer.  Maternal great grandmother with breast cancer.    Plan: High-grade serous carcinoma of peritoneum: - CTAP (02/07/2023): No evidence of residual or recurrent malignancy.  Previous omental stranding and ascites resolved. - She is tolerating olaparib reasonably well. - She gets diarrhea after each infusion of bevacizumab.  She takes 2 tablets of Lomotil 3 times daily for 1 to 2 days after each treatment which helps with the diarrhea. - Labs today: Normal LFTs and creatinine.  CBC grossly normal with hemoglobin 10.5.  CA125 was 11.7.  Level from today is pending.  Last UA shows protein 30. - Continue olaparib 300 mg twice daily. - Continue bevacizumab 15 mg/kg every 21 days.  RTC 9 weeks for follow-up.  I plan to repeat CTAP with contrast prior to next visit.   2.  Peripheral neuropathy: - Continue Cymbalta 60 mg daily.  Numbness in the toes is stable.  3.  Hypomagnesemia: - Continue magnesium 3 times daily.  Magnesium is 1.7 today.  Reports occasional cramping in the hands.  4.  Hypokalemia: - Continue potassium 20 mill equivalents daily.  Potassium is 3.4 today.   5.  Subsegmental pulmonary embolism (Dx 03/06/2022): - Continue Eliquis twice daily.  No bleeding issues.    No orders of the defined types were placed in this encounter.  [name], MD   10/28/202411:47 AM  CHIEF COMPLAINT:   Diagnosis: high-grade serous carcinoma of peritoneum    Cancer Staging  Primary peritoneal  carcinomatosis [facility]) Staging form: Ovary, Fallopian Tube, and Primary Peritoneal Carcinoma, AJCC 8th Edition - Clinical stage from 11/17/2021: FIGO Stage IIIC (cT3c, cM0) - Unsigned    Prior Therapy: Carboplatin and paclitaxel-- x 4 cycles 11/21/2021 - 01/23/2022, x5 cycles 06/05/22 - 08/29/22, x3 cycles 11/05/22 - 12/17/22   Current Therapy:  olaparib and maintenance bevacizumab    HISTORY OF PRESENT ILLNESS:   Oncology History  Malignant neoplasm of ovary (HCC)  11/15/2021 Initial Diagnosis   Malignant neoplasm of ovary (HCC)   01/07/2023 -  Chemotherapy   Patient is on Treatment Plan : OVARIAN Bevacizumab q21d     Primary peritoneal carcinomatosis (HCC)  11/13/2021 Imaging   CT A/P: 1. Trace ascites throughout the abdomen and pelvis, with extensive peritoneal thickening, nodularity, and omental caking. Findings are consistent with widespread peritoneal and omental metastatic disease. 2. No evidence of primary malignancy in the chest, abdomen, or pelvis, with specific attention to the most likely etiologies of peritoneal metastatic disease including pancreatic, ovarian, and endometrial malignancy. Although there is no obvious mass, pelvic ultrasound may be helpful to more closely assess the female reproductive organs. 3. Small left pleural effusion and associated atelectasis or consolidation, nonspecific although worrisome for malignant effusion. No overt pleural thickening or nodularity. 4. Scattered areas of nonspecific infectious or inflammatory alveolar ground-glass airspace opacity throughout the lungs. 5. Cholelithiasis without evidence of acute cholecystitis. 6. Nonobstructive right nephrolithiasis. 7. Postoperative findings of same day laparoscopy including minimal pneumoperitoneum.   Aortic Atherosclerosis (ICD10-I70.0).   11/13/2021 Surgery   Diagnostic laparoscopy with biopsy Dr. Lovell Sheehan Findings: Significant bloody ascites, studding of the omentum as well as the upper  abdominal wall and diaphragm.  Nodularity noted along the small intestine.  Omental disease inhibited view of the pelvis.   11/17/2021 Initial Diagnosis   Primary peritoneal carcinomatosis (HCC)   11/21/2021 - 01/25/2022 Chemotherapy   Patient is on Treatment Plan : OVARIAN Carboplatin (AUC 6) / Paclitaxel (175) q21d x 6 cycles     11/21/2021 - 12/17/2022 Chemotherapy   Patient is on Treatment Plan : OVARIAN Carboplatin (AUC 6) + Paclitaxel (175)  + Mvasi q21d X 6 Cycles     01/19/2022 Imaging   CT A/P: 1. Small amount of adherent thrombus or fibrin sheath around the catheter in the LEFT brachiocephalic vein. 2. Small low-attenuation area in the inter lobar fissure region associated with medial segment of LEFT hepatic lobe, new from previous imaging, may represent developing "variable fat deposition" though given history of peritoneal disease would suggest attention on subsequent imaging. 3. Diminished thickening of mesenteric reflections, decreased ascites and decreased omental infiltration since previous imaging. 4. Focal concentric narrowing of the rectosigmoid junction is nonspecific but suspicious for neoplasm potentially related to serosal involvement in the pelvis in this patient with known peritoneal disease from ovarian cancer. Underlying colonic neoplasm would be difficult to exclude as well. Colonic assessment with endoscopy or comparison with recent endoscopy performed is suggested. 5. Cholelithiasis without evidence of acute cholecystitis. 6. 4 mm RIGHT lower pole renal calculus.   Aortic Atherosclerosis (ICD10-I70.0).   03/06/2022 Imaging   CT C/A/P: Chest CT:   Scattered subsegmental pulmonary emboli in the right lung.   Abdominal CT:   1. Mild right hydroureteronephrosis from a 4 mm UVJ calculus. 2. Unchanged omental disease. 3. Cholelithiasis and other chronic findings described above.  05/24/2022 Imaging   Increased peritoneal carcinomatosis and mild  ascites.   New moderate distal small bowel obstruction, with transition point in the central pelvis adjacent to the uterine fundus, which may be due to peritoneal carcinomatosis or adhesion.   Cholelithiasis. No radiographic evidence of cholecystitis.   Aortic Atherosclerosis (ICD10-I70.0).   08/21/2022 Imaging   CT A/P: 1. Improved peritoneal carcinomatosis and mild ascites. 2. Prominent/mildly enlarged loops of small bowel throughout the abdomen with transition point in the pelvis, not significantly changed from prior and compatible with a least low-grade obstruction. 3. Cholelithiasis without findings of acute cholecystitis.   10/01/2022 Surgery   Diagnostic laparoscopy, robotic assisted laparoscopic lysis of adhesions, exploratory laparotomy, omentectomy   Findings: On EUA, small uterus within minimal mobility. On intra-abdominal entry, omentum adherent to the anterior abdominal wall at the level of the transverse colon. Some adhesions noted between the anterior liver surface and the diaphragm on the right. Small miliary disease noted on bilateral diaphragms, small bowel, small bowel mesentery, abdominal and pelvic peritoneum. No ascites. Complete retroperitonealization of the pelvic organs. Multiple loops of small bowel were densely adherent to the uterine fundus without obvious obstruction. Terminal ileum and cecum also adherent along the right pelvic sidewall. Sigmoid colon densely adherent to a loop of ileum and to the lateral aspect of the retroperitonealized pelvis on the left. After opening the retroperitoneum bilaterally and transecting the round ligaments, the bladder flap was able to be partially developed anteriorly. This revealed dense adhesions of the sigmoid to the lateral uterus. Given prior discussions with the patient, I did not feel that proceeding with further attempt at lysis of adhesions (some mobilization of two loops of ileum adherent to the uterine fundus was  performed) would be possible without significant risk of injury to multiple loops of small bowel and to the sigmoid colon. 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 On laparotomy, 8 x 4 cm omental cake noted. No disease visualized or palpated within the lesser sac. There was no palpable disease along the liver edges or spleen. Stomach was normal in appearance. Some miliary disease palpated along the abdominal peritoneum. Numerous miliary disease noted along the small bowel and small bowel mesentery. Numerous prominent lymph nodes (1cm or less) within the stalk of the small bowel mesentery.  R1 resection based on miliary disease (in the setting of incomplete pelvic evaluation).   10/01/2022 Pathology Results   A. Pelvic sidewall, right, excision - High-grade serous carcinoma   B. Omentum, resection - High-grade serous carcinoma - Treatment effect present - See comment   C. Small bowel, implant, excision - Infarcted epiploic appendage - Negative for carcinoma  Immunohistochemical stains were performed on block B1. The tumor cells are positive for WT1, compatible with tubo-ovarian or peritoneal origin. p53 demonstrates faint positivity in rare cells, favored to be most consistent with a null pattern.    11/05/2022 Genetic Testing   Negative genetic testing on the [facility] Multi-cancer + RNA panel.  The report date is November 05, 2022.  The Multi-Cancer + RNA Panel offered by [facility] includes sequencing and/or deletion/duplication analysis of the following 70 genes:  AIP*, ALK, APC*, ATM*, AXIN2*, BAP1*, BARD1*, BLM*, BMPR1A*, BRCA1*, BRCA2*, BRIP1*, CDC73*, CDH1*, CDK4, CDKN1B*, CDKN2A, CHEK2*, CTNNA1*, DICER1*, EPCAM (del/dup only), EGFR, FH*, FLCN*, GREM1 (promoter dup only), HOXB13, KIT, LZTR1, MAX*, MBD4, MEN1*, MET, MITF, MLH1*, MSH2*, MSH3*, MSH6*, MUTYH*, NF1*, NF2*, NTHL1*, PALB2*, PDGFRA, PMS2*, POLD1*, POLE*, POT1*, PRKAR1A*, PTCH1*, PTEN*,  RAD51C*, RAD51D*, RB1*,  RET, SDHA* (sequencing only), SDHAF2*, SDHB*, SDHC*, SDHD*, SMAD4*, SMARCA4*, SMARCB1*, SMARCE1*, STK11*, SUFU*, TMEM127*, TP53*, TSC1*, TSC2*, VHL*. RNA analysis is performed for * genes.    01/07/2023 -  Chemotherapy   Patient is on Treatment Plan : OVARIAN Bevacizumab q21d        INTERVAL HISTORY:   Nicole Santos is a 52 y.o. female seen for follow-up of high-grade serous carcinoma of the peritoneum.  She is tolerating olaparib very well.  Denies any major bleeding issues.  Reports appetite and energy levels at 100%.  PAST MEDICAL HISTORY:   Past Medical History: Past Medical History:  Diagnosis Date   Acute respiratory disease due to COVID-19 virus 02/02/2019   Anxiety    Cancer (HCC)    Depression    Dyspnea    Dysrhythmia    Family history of bladder cancer    Family history of breast cancer    History of diabetes mellitus    Resolved in Sept 2023 with weight loss.   Hypertension    Ovarian cancer (HCC) 11/14/2021   Peritoneal carcinomatosis (HCC)    Pneumonia    Sleep apnea    doesn't use CPAP   Tachycardia     Surgical History: Past Surgical History:  Procedure Laterality Date   KNEE SURGERY Right    Arthroscopic   LAPAROSCOPIC ABDOMINAL EXPLORATION N/A 11/13/2021   Procedure: Exploratory laparoscopy;  Surgeon: Franky Macho, MD;  Location: [facility];  Service: General;  Laterality: N/A;   PORTACATH PLACEMENT Left 11/15/2021   Procedure: INSERTION PORT-A-CATH;  Surgeon: Franky Macho, MD;  Location: [facility];  Service: General;  Laterality: Left;   TONSILLECTOMY AND ADENOIDECTOMY      Social History: Social History   Socioeconomic History   Marital status: Married    Spouse name: Not on file   Number of children: Not on file   Years of education: Not on file   Highest education level: Not on file  Occupational History   Occupation: Unemployed  Tobacco Use   Smoking status: Never   Smokeless tobacco: Never  Vaping Use   Vaping status: Never  Used  Substance and Sexual Activity   Alcohol use: No   Drug use: No   Sexual activity: Not Currently  Other Topics Concern   Not on file  Social History Narrative   Not on file   Social Determinants of Health   Financial Resource Strain: Not on file  Food Insecurity: No Food Insecurity (03/06/2022)   Hunger Vital Sign    Worried About Running Out of Food in the Last Year: Never true    Ran Out of Food in the Last Year: Never true  Transportation Needs: No Transportation Needs (03/06/2022)   PRAPARE - Administrator, Civil Service (Medical): No    Lack of Transportation (Non-Medical): No  Physical Activity: Not on file  Stress: Not on file  Social Connections: Not on file  Intimate Partner Violence: Not At Risk (03/06/2022)   Humiliation, Afraid, Rape, and Kick questionnaire    Fear of Current or Ex-Partner: No    Emotionally Abused: No    Physically Abused: No    Sexually Abused: No    Family History: Family History  Problem Relation Age of Onset   Hypertension Mother    Heart attack Paternal Uncle    Bladder Cancer Maternal Grandmother 94       non-smoker   Dementia Maternal Grandfather    Breast cancer Maternal Great-grandmother  MGF's mother   Colon cancer Neg Hx    Ovarian cancer Neg Hx    Endometrial cancer Neg Hx    Pancreatic cancer Neg Hx    Prostate cancer Neg Hx     Current Medications:  Current Outpatient Medications:    apixaban (ELIQUIS) 5 MG TABS tablet, Take 1 tablet (5 mg total) by mouth 2 (two) times daily., Disp: 180 tablet, Rfl: 3   atorvastatin (LIPITOR) 20 MG tablet, Take 1 tablet (20 mg total) by mouth at bedtime., Disp: 90 tablet, Rfl: 3   diphenoxylate-atropine (LOMOTIL) 2.5-0.025 MG tablet, Take 2 tablets by mouth after the first watery bowel movement. May then take 1 pill by mouth after each watery bowel movement. Do not exceed 8 pills in a 24 hour period., Disp: 60 tablet, Rfl: 3   DULoxetine (CYMBALTA) 30 MG capsule,  TAKE 1 CAPSULE BY MOUTH DAILY FOR 7 DAYS, THEN INCREASE TO 2 CAPSULES DAILY, Disp: 60 capsule, Rfl: 5   lidocaine-prilocaine (EMLA) cream, Apply a quarter sized amount to port a cath site and cover with plastic wrap 1 hour prior to infusion appointments, Disp: 30 g, Rfl: 3   magnesium oxide (MAG-OX) 400 (240 Mg) MG tablet, TAKE 1 TABLET BY MOUTH TWICE A DAY, Disp: 60 tablet, Rfl: 2   Multiple Vitamin (MULTIVITAMIN) capsule, Take 1 capsule by mouth daily., Disp: , Rfl:    olaparib (LYNPARZA) 150 MG tablet, Take 2 tablets (300 mg total) by mouth 2 (two) times daily. Swallow whole. May take with food to decrease nausea and vomiting., Disp: 120 tablet, Rfl: 3   Potassium 99 MG TABS, Take 99 mg by mouth at bedtime., Disp: , Rfl:    pregabalin (LYRICA) 100 MG capsule, Take 100 mg by mouth 2 (two) times daily., Disp: , Rfl:    prochlorperazine (COMPAZINE) 10 MG tablet, Take 1 tablet (10 mg total) by mouth every 6 (six) hours as needed for nausea or vomiting., Disp: 30 tablet, Rfl: 3 No current facility-administered medications for this visit.  Facility-Administered Medications Ordered in Other Visits:    heparin lock flush 100 unit/mL, 500 Units, Intravenous, Once, [name], MD   sodium chloride flush (NS) 0.9 % injection 10 mL, 10 mL, Intravenous, PRN, [name], MD   sodium chloride flush (NS) 0.9 % injection 10 mL, 10 mL, Intravenous, PRN, [name], MD, 10 mL at 04/01/23 1114   Allergies: Allergies  Allergen Reactions   Augmentin [Amoxicillin-Pot Clavulanate] Diarrhea and Nausea And Vomiting   Doxycycline     Abdominal cramps.   Topamax [Topiramate] Nausea And Vomiting    REVIEW OF SYSTEMS:   Review of Systems  Constitutional:  Negative for chills, fatigue and fever.  HENT:   Negative for lump/mass, mouth sores, nosebleeds, sore throat and trouble swallowing.   Eyes:  Negative for eye problems.  Respiratory:  Negative for shortness of breath.    Cardiovascular:  Negative for chest pain, leg swelling and palpitations.  Gastrointestinal:  Negative for abdominal pain, constipation, nausea and vomiting.  Genitourinary:  Negative for bladder incontinence, difficulty urinating, dysuria, frequency, hematuria and nocturia.   Musculoskeletal:  Negative for arthralgias, back pain, flank pain, myalgias and neck pain.  Skin:  Negative for itching and rash.  Neurological:  Positive for numbness. Negative for dizziness and headaches.  Hematological:  Does not bruise/bleed easily.  Psychiatric/Behavioral:  Negative for depression, sleep disturbance and suicidal ideas. The patient is not nervous/anxious.   All other systems reviewed and are negative.  VITALS:   There were no vitals taken for this visit.  Wt Readings from Last 3 Encounters:  04/01/23 202 lb 12.8 oz (92 kg)  03/29/23 201 lb (91.2 kg)  03/11/23 201 lb 8 oz (91.4 kg)    There is no height or weight on file to calculate BMI.  Performance status (ECOG): 1 - Symptomatic but completely ambulatory  PHYSICAL EXAM:   Physical Exam Vitals and nursing note reviewed. Exam conducted with a chaperone present.  Constitutional:      Appearance: Normal appearance.  Cardiovascular:     Rate and Rhythm: Normal rate and regular rhythm.     Pulses: Normal pulses.     Heart sounds: Normal heart sounds.  Pulmonary:     Effort: Pulmonary effort is normal.     Breath sounds: Normal breath sounds.  Abdominal:     Palpations: Abdomen is soft. There is no hepatomegaly, splenomegaly or mass.     Tenderness: There is no abdominal tenderness.  Musculoskeletal:     Right lower leg: No edema.     Left lower leg: No edema.  Lymphadenopathy:     Cervical: No cervical adenopathy.     Right cervical: No superficial, deep or posterior cervical adenopathy.    Left cervical: No superficial, deep or posterior cervical adenopathy.     Upper Body:     Right upper body: No supraclavicular or axillary  adenopathy.     Left upper body: No supraclavicular or axillary adenopathy.  Neurological:     General: No focal deficit present.     Mental Status: She is alert and oriented to person, place, and time.  Psychiatric:        Mood and Affect: Mood normal.        Behavior: Behavior normal.     LABS:      Latest Ref Rng & Units 04/01/2023   11:12 AM 03/11/2023    8:49 AM 02/18/2023   10:55 AM  CBC  WBC 4.0 - 10.5 K/uL 5.5  5.2  5.7   Hemoglobin 12.0 - 15.0 g/dL 45.4  09.8  11.9   Hematocrit 36.0 - 46.0 % 31.1  30.3  32.3   Platelets 150 - 400 K/uL 153  130  156       Latest Ref Rng & Units 03/11/2023    8:49 AM 02/18/2023   10:55 AM 01/28/2023    9:48 AM  CMP  Glucose 70 - 99 mg/dL 147  829  562   BUN 6 - 20 mg/dL 15  13  15    Creatinine 0.44 - 1.00 mg/dL 1.30  8.65  7.84   Sodium 135 - 145 mmol/L 138  138  137   Potassium 3.5 - 5.1 mmol/L 3.8  3.7  3.5   Chloride 98 - 111 mmol/L 104  103  103   CO2 22 - 32 mmol/L 26  25  24    Calcium 8.9 - 10.3 mg/dL 8.6  9.0  8.4   Total Protein 6.5 - 8.1 g/dL 6.4  7.1  6.6   Total Bilirubin 0.3 - 1.2 mg/dL 0.8  1.3  0.8   Alkaline Phos 38 - 126 U/L 51  57  54   AST 15 - 41 U/L 19  20  18    ALT 0 - 44 U/L 14  14  13       Lab Results  Component Value Date   CEA1 1.1 11/13/2021   /  CEA  Date Value Ref Range  Status  11/13/2021 1.1 0.0 - 4.7 ng/mL Final    Comment:    (NOTE)                             Nonsmokers          <3.9                             Smokers             <5.6 [employer] Electrochemiluminescence Immunoassay (ECLIA) Values obtained with different assay methods or kits cannot be used interchangeably.  Results cannot be interpreted as absolute evidence of the presence or absence of malignant disease. Performed At: [facility] [address] [place], [name] [id number] Jolene Schimke MD [id number]    No results found for: "PSA1" Lab Results  Component Value Date   CAN199 6 11/13/2021    Lab Results  Component Value Date   CAN125 11.7 02/18/2023    No results found for: "TOTALPROTELP", "ALBUMINELP", "A1GS", "A2GS", "BETS", "BETA2SER", "GAMS", "MSPIKE", "SPEI" Lab Results  Component Value Date   FERRITIN 356 (H) 02/07/2019   FERRITIN 311 (H) 02/06/2019   FERRITIN 343 (H) 02/05/2019   Lab Results  Component Value Date   LDH 259 (H) 02/02/2019     STUDIES:   No results found.

## 2023-04-01 NOTE — Progress Notes (Signed)
Patient is taking [place] as prescribed. She has not missed any doses and reports no side effects at this time.    Patient has been examined by Dr. Ellin Saba. Vital signs and labs have been reviewed by MD - ANC, Creatinine, LFTs, hemoglobin, and platelets are within treatment parameters per M.D. - pt may proceed with treatment.  Primary RN and pharmacy notified.

## 2023-04-01 NOTE — Progress Notes (Signed)
Patients port flushed without difficulty.  Small blood return noted with no bruising or swelling noted at site. Labs drawn peripherally.  Patient remains accessed for treatment.

## 2023-04-01 NOTE — Patient Instructions (Signed)
[facility]  Discharge Instructions: Thank you for choosing [facility] to provide your oncology and hematology care.  If you have a lab appointment with the Cancer Center - please note that after April 8th, 2024, all labs will be drawn in the cancer center.  You do not have to check in or register with the main entrance as you have in the past but will complete your check-in in the cancer center.  Wear comfortable clothing and clothing appropriate for easy access to any Portacath or PICC line.   We strive to give you quality time with your provider. You may need to reschedule your appointment if you arrive late (15 or more minutes).  Arriving late affects you and other patients whose appointments are after yours.  Also, if you miss three or more appointments without notifying the office, you may be dismissed from the clinic at the provider's discretion.      For prescription refill requests, have your pharmacy contact our office and allow 72 hours for refills to be completed.    Today you received the following chemotherapy and/or immunotherapy agents MVASI, return as scheduled.    To help prevent nausea and vomiting after your treatment, we encourage you to take your nausea medication as directed.  BELOW ARE SYMPTOMS THAT SHOULD BE REPORTED IMMEDIATELY: *FEVER GREATER THAN 100.4 F (38 C) OR HIGHER *CHILLS OR SWEATING *NAUSEA AND VOMITING THAT IS NOT CONTROLLED WITH YOUR NAUSEA MEDICATION *UNUSUAL SHORTNESS OF BREATH *UNUSUAL BRUISING OR BLEEDING *URINARY PROBLEMS (pain or burning when urinating, or frequent urination) *BOWEL PROBLEMS (unusual diarrhea, constipation, pain near the anus) TENDERNESS IN MOUTH AND THROAT WITH OR WITHOUT PRESENCE OF ULCERS (sore throat, sores in mouth, or a toothache) UNUSUAL RASH, SWELLING OR PAIN  UNUSUAL VAGINAL DISCHARGE OR ITCHING   Items with * indicate a potential emergency and should be followed up as soon as  possible or go to the Emergency Department if any problems should occur.  Please show the CHEMOTHERAPY ALERT CARD or IMMUNOTHERAPY ALERT CARD at check-in to the Emergency Department and triage nurse.  Should you have questions after your visit or need to cancel or reschedule your appointment, please contact [facility] AT [facility] [phone]  and follow the prompts.  Office hours are 8:00 a.m. to 4:30 p.m. Monday - Friday. Please note that voicemails left after 4:00 p.m. may not be returned until the following business day.  We are closed weekends and major holidays. You have access to a nurse at all times for urgent questions. Please call the main number to the clinic [phone] and follow the prompts.  For any non-urgent questions, you may also contact your provider using MyChart. We now offer e-Visits for anyone 42 and older to request care online for non-urgent symptoms. For details visit [email or web address].   Also download the MyChart app! Go to the app store, search "MyChart", open the app, select [place], and log in with your MyChart username and password.

## 2023-04-01 NOTE — Patient Instructions (Signed)

## 2023-04-02 LAB — CA 125: Cancer Antigen (CA) 125: 18.4 U/mL (ref 0.0–38.1)

## 2023-04-07 ENCOUNTER — Other Ambulatory Visit: Payer: Self-pay | Admitting: Hematology

## 2023-04-08 ENCOUNTER — Encounter: Payer: Self-pay | Admitting: Hematology

## 2023-04-15 ENCOUNTER — Other Ambulatory Visit: Payer: Self-pay | Admitting: Hematology

## 2023-04-15 DIAGNOSIS — C482 Malignant neoplasm of peritoneum, unspecified: Secondary | ICD-10-CM

## 2023-04-22 ENCOUNTER — Inpatient Hospital Stay: Payer: Commercial Managed Care - PPO | Attending: Gynecologic Oncology

## 2023-04-22 ENCOUNTER — Inpatient Hospital Stay: Payer: Commercial Managed Care - PPO | Admitting: Hematology

## 2023-04-22 ENCOUNTER — Inpatient Hospital Stay: Payer: Commercial Managed Care - PPO

## 2023-04-22 VITALS — BP 129/65 | HR 92 | Temp 97.0°F | Resp 20

## 2023-04-22 DIAGNOSIS — C482 Malignant neoplasm of peritoneum, unspecified: Secondary | ICD-10-CM | POA: Insufficient documentation

## 2023-04-22 DIAGNOSIS — C569 Malignant neoplasm of unspecified ovary: Secondary | ICD-10-CM

## 2023-04-22 DIAGNOSIS — Z79899 Other long term (current) drug therapy: Secondary | ICD-10-CM | POA: Diagnosis not present

## 2023-04-22 DIAGNOSIS — Z5112 Encounter for antineoplastic immunotherapy: Secondary | ICD-10-CM | POA: Insufficient documentation

## 2023-04-22 LAB — COMPREHENSIVE METABOLIC PANEL
ALT: 15 U/L (ref 0–44)
AST: 20 U/L (ref 15–41)
Albumin: 3.3 g/dL — ABNORMAL LOW (ref 3.5–5.0)
Alkaline Phosphatase: 53 U/L (ref 38–126)
Anion gap: 9 (ref 5–15)
BUN: 15 mg/dL (ref 6–20)
CO2: 24 mmol/L (ref 22–32)
Calcium: 8.6 mg/dL — ABNORMAL LOW (ref 8.9–10.3)
Chloride: 105 mmol/L (ref 98–111)
Creatinine, Ser: 0.75 mg/dL (ref 0.44–1.00)
GFR, Estimated: >60 mL/min (ref >60)
Glucose, Bld: 119 mg/dL — ABNORMAL HIGH (ref 70–99)
Potassium: 4 mmol/L (ref 3.5–5.1)
Sodium: 138 mmol/L (ref 135–145)
Total Bilirubin: 0.9 mg/dL (ref <1.2)
Total Protein: 6.3 g/dL — ABNORMAL LOW (ref 6.5–8.1)

## 2023-04-22 LAB — CBC WITH DIFFERENTIAL/PLATELET
Abs Immature Granulocytes: 0.03 10*3/uL (ref 0.00–0.07)
Basophils Absolute: 0 10*3/uL (ref 0.0–0.1)
Basophils Relative: 1 %
Eosinophils Absolute: 0.2 10*3/uL (ref 0.0–0.5)
Eosinophils Relative: 3 %
HCT: 32.2 % — ABNORMAL LOW (ref 36.0–46.0)
Hemoglobin: 11.1 g/dL — ABNORMAL LOW (ref 12.0–15.0)
Immature Granulocytes: 1 %
Lymphocytes Relative: 25 %
Lymphs Abs: 1.3 10*3/uL (ref 0.7–4.0)
MCH: 39.2 pg — ABNORMAL HIGH (ref 26.0–34.0)
MCHC: 34.5 g/dL (ref 30.0–36.0)
MCV: 113.8 fL — ABNORMAL HIGH (ref 80.0–100.0)
Monocytes Absolute: 0.4 10*3/uL (ref 0.1–1.0)
Monocytes Relative: 8 %
Neutro Abs: 3.3 10*3/uL (ref 1.7–7.7)
Neutrophils Relative %: 62 %
Platelets: 153 10*3/uL (ref 150–400)
RBC: 2.83 MIL/uL — ABNORMAL LOW (ref 3.87–5.11)
RDW: 17.4 % — ABNORMAL HIGH (ref 11.5–15.5)
WBC: 5.3 10*3/uL (ref 4.0–10.5)
nRBC: 1 % — ABNORMAL HIGH (ref 0.0–0.2)

## 2023-04-22 LAB — MAGNESIUM: Magnesium: 1.7 mg/dL (ref 1.7–2.4)

## 2023-04-22 LAB — URINALYSIS, DIPSTICK ONLY
Bilirubin Urine: NEGATIVE
Glucose, UA: NEGATIVE mg/dL
Hgb urine dipstick: NEGATIVE
Ketones, ur: NEGATIVE mg/dL
Leukocytes,Ua: NEGATIVE
Nitrite: NEGATIVE
Protein, ur: NEGATIVE mg/dL
Specific Gravity, Urine: 1.023 (ref 1.005–1.030)
pH: 5 (ref 5.0–8.0)

## 2023-04-22 MED ORDER — HEPARIN SOD (PORK) LOCK FLUSH 100 UNIT/ML IV SOLN
500.0000 [IU] | Freq: Once | INTRAVENOUS | Status: AC | PRN
  Administered 2023-04-22: 500 [IU]

## 2023-04-22 MED ORDER — SODIUM CHLORIDE 0.9 % IV SOLN
15.0000 mg/kg | Freq: Once | INTRAVENOUS | Status: AC
  Administered 2023-04-22: 1400 mg via INTRAVENOUS
  Filled 2023-04-22: qty 48

## 2023-04-22 MED ORDER — SODIUM CHLORIDE 0.9% FLUSH
10.0000 mL | INTRAVENOUS | Status: DC | PRN
Start: 2023-04-22 — End: 2023-04-22
  Administered 2023-04-22: 10 mL

## 2023-04-22 MED ORDER — SODIUM CHLORIDE 0.9 % IV SOLN
Freq: Once | INTRAVENOUS | Status: AC
Start: 2023-04-22 — End: 2023-04-22

## 2023-04-22 NOTE — Patient Instructions (Signed)
[facility] - A DEPT OF [facility]  Discharge Instructions: Thank you for choosing [facility] to provide your oncology and hematology care.  If you have a lab appointment with the Cancer Center - please note that after April 8th, 2024, all labs will be drawn in the cancer center.  You do not have to check in or register with the main entrance as you have in the past but will complete your check-in in the cancer center.  Wear comfortable clothing and clothing appropriate for easy access to any Portacath or PICC line.   We strive to give you quality time with your provider. You may need to reschedule your appointment if you arrive late (15 or more minutes).  Arriving late affects you and other patients whose appointments are after yours.  Also, if you miss three or more appointments without notifying the office, you may be dismissed from the clinic at the provider's discretion.      For prescription refill requests, have your pharmacy contact our office and allow 72 hours for refills to be completed.    Today you received the following chemotherapy and/or immunotherapy agents MVASI      To help prevent nausea and vomiting after your treatment, we encourage you to take your nausea medication as directed.  BELOW ARE SYMPTOMS THAT SHOULD BE REPORTED IMMEDIATELY: *FEVER GREATER THAN 100.4 F (38 C) OR HIGHER *CHILLS OR SWEATING *NAUSEA AND VOMITING THAT IS NOT CONTROLLED WITH YOUR NAUSEA MEDICATION *UNUSUAL SHORTNESS OF BREATH *UNUSUAL BRUISING OR BLEEDING *URINARY PROBLEMS (pain or burning when urinating, or frequent urination) *BOWEL PROBLEMS (unusual diarrhea, constipation, pain near the anus) TENDERNESS IN MOUTH AND THROAT WITH OR WITHOUT PRESENCE OF ULCERS (sore throat, sores in mouth, or a toothache) UNUSUAL RASH, SWELLING OR PAIN  UNUSUAL VAGINAL DISCHARGE OR ITCHING   Items with * indicate a potential emergency and should be followed up as  soon as possible or go to the Emergency Department if any problems should occur.  Please show the CHEMOTHERAPY ALERT CARD or IMMUNOTHERAPY ALERT CARD at check-in to the Emergency Department and triage nurse.  Should you have questions after your visit or need to cancel or reschedule your appointment, please contact [facility] - A DEPT OF Eligha Bridegroom [facility] [phone]  and follow the prompts.  Office hours are 8:00 a.m. to 4:30 p.m. Monday - Friday. Please note that voicemails left after 4:00 p.m. may not be returned until the following business day.  We are closed weekends and major holidays. You have access to a nurse at all times for urgent questions. Please call the main number to the clinic [phone] and follow the prompts.  For any non-urgent questions, you may also contact your provider using MyChart. We now offer e-Visits for anyone 1 and older to request care online for non-urgent symptoms. For details visit [email or web address].   Also download the MyChart app! Go to the app store, search "MyChart", open the app, select [place], and log in with your MyChart username and password.

## 2023-04-22 NOTE — Progress Notes (Signed)
Patient presents today for MVASI infusion per providers order.  Vital signs and labs within parameters for treatment.  Patient has no new complaints at this time.  Treatment given today per MD orders.  Stable during infusion without adverse affects.  Vital signs stable.  No complaints at this time.  Discharge from clinic ambulatory in stable condition.  Alert and oriented X 3.  Follow up with [facility] as scheduled.

## 2023-05-13 ENCOUNTER — Inpatient Hospital Stay: Payer: Commercial Managed Care - PPO

## 2023-05-13 ENCOUNTER — Inpatient Hospital Stay: Payer: Commercial Managed Care - PPO | Attending: Gynecologic Oncology

## 2023-05-13 ENCOUNTER — Ambulatory Visit (HOSPITAL_COMMUNITY)
Admission: RE | Admit: 2023-05-13 | Discharge: 2023-05-13 | Disposition: A | Discharge status: Home / Self Care | Payer: Commercial Managed Care - PPO | Source: Ambulatory Visit | Attending: Oncology

## 2023-05-13 ENCOUNTER — Other Ambulatory Visit: Payer: Self-pay | Admitting: *Deleted

## 2023-05-13 VITALS — BP 138/92 | HR 94 | Temp 97.7°F | Resp 18 | Wt 207.4 lb

## 2023-05-13 DIAGNOSIS — Z79899 Other long term (current) drug therapy: Secondary | ICD-10-CM | POA: Diagnosis not present

## 2023-05-13 DIAGNOSIS — Z95828 Presence of other vascular implants and grafts: Secondary | ICD-10-CM | POA: Insufficient documentation

## 2023-05-13 DIAGNOSIS — Z5112 Encounter for antineoplastic immunotherapy: Secondary | ICD-10-CM | POA: Insufficient documentation

## 2023-05-13 DIAGNOSIS — C482 Malignant neoplasm of peritoneum, unspecified: Secondary | ICD-10-CM | POA: Diagnosis present

## 2023-05-13 DIAGNOSIS — C569 Malignant neoplasm of unspecified ovary: Secondary | ICD-10-CM

## 2023-05-13 LAB — COMPREHENSIVE METABOLIC PANEL
ALT: 12 U/L (ref 0–44)
AST: 18 U/L (ref 15–41)
Albumin: 3.3 g/dL — ABNORMAL LOW (ref 3.5–5.0)
Alkaline Phosphatase: 51 U/L (ref 38–126)
Anion gap: 9 (ref 5–15)
BUN: 18 mg/dL (ref 6–20)
CO2: 25 mmol/L (ref 22–32)
Calcium: 8.6 mg/dL — ABNORMAL LOW (ref 8.9–10.3)
Chloride: 103 mmol/L (ref 98–111)
Creatinine, Ser: 0.75 mg/dL (ref 0.44–1.00)
GFR, Estimated: >60 mL/min (ref >60)
Glucose, Bld: 117 mg/dL — ABNORMAL HIGH (ref 70–99)
Potassium: 3.5 mmol/L (ref 3.5–5.1)
Sodium: 137 mmol/L (ref 135–145)
Total Bilirubin: 0.8 mg/dL (ref <1.2)
Total Protein: 6 g/dL — ABNORMAL LOW (ref 6.5–8.1)

## 2023-05-13 LAB — CBC WITH DIFFERENTIAL/PLATELET
Abs Immature Granulocytes: 0.03 10*3/uL (ref 0.00–0.07)
Basophils Absolute: 0 10*3/uL (ref 0.0–0.1)
Basophils Relative: 0 %
Eosinophils Absolute: 0.1 10*3/uL (ref 0.0–0.5)
Eosinophils Relative: 3 %
HCT: 31.5 % — ABNORMAL LOW (ref 36.0–46.0)
Hemoglobin: 10.5 g/dL — ABNORMAL LOW (ref 12.0–15.0)
Immature Granulocytes: 1 %
Lymphocytes Relative: 25 %
Lymphs Abs: 1.3 10*3/uL (ref 0.7–4.0)
MCH: 39.2 pg — ABNORMAL HIGH (ref 26.0–34.0)
MCHC: 33.3 g/dL (ref 30.0–36.0)
MCV: 117.5 fL — ABNORMAL HIGH (ref 80.0–100.0)
Monocytes Absolute: 0.4 10*3/uL (ref 0.1–1.0)
Monocytes Relative: 9 %
Neutro Abs: 3.3 10*3/uL (ref 1.7–7.7)
Neutrophils Relative %: 62 %
Platelets: 152 10*3/uL (ref 150–400)
RBC: 2.68 MIL/uL — ABNORMAL LOW (ref 3.87–5.11)
RDW: 16.8 % — ABNORMAL HIGH (ref 11.5–15.5)
WBC: 5.2 10*3/uL (ref 4.0–10.5)
nRBC: 1 % — ABNORMAL HIGH (ref 0.0–0.2)

## 2023-05-13 LAB — MAGNESIUM: Magnesium: 1.7 mg/dL (ref 1.7–2.4)

## 2023-05-13 MED ORDER — SODIUM CHLORIDE 0.9% FLUSH
10.0000 mL | INTRAVENOUS | Status: DC | PRN
  Administered 2023-05-13: 10 mL

## 2023-05-13 MED ORDER — HEPARIN SOD (PORK) LOCK FLUSH 100 UNIT/ML IV SOLN
INTRAVENOUS | Status: AC
Start: 2023-05-13 — End: ?
  Filled 2023-05-13: qty 5

## 2023-05-13 MED ORDER — HEPARIN SOD (PORK) LOCK FLUSH 100 UNIT/ML IV SOLN: 500.0000 [IU] | Freq: Once | INTRAVENOUS | Status: DC | PRN

## 2023-05-13 MED ORDER — ALTEPLASE 2 MG IJ SOLR
2.0000 mg | Freq: Once | INTRAMUSCULAR | Status: AC
  Administered 2023-05-13: 2 mg
  Filled 2023-05-13: qty 2

## 2023-05-13 MED ORDER — SODIUM CHLORIDE 0.9% FLUSH
10.0000 mL | INTRAVENOUS | Status: AC
  Administered 2023-05-13: 10 mL

## 2023-05-13 MED ORDER — SODIUM CHLORIDE 0.9 % IV SOLN: Freq: Once | INTRAVENOUS | Status: AC

## 2023-05-13 MED ORDER — IOHEXOL 300 MG/ML  SOLN
10.0000 mL | Freq: Once | INTRAMUSCULAR | Status: AC | PRN
  Administered 2023-05-13: 10 mL

## 2023-05-13 MED ORDER — BEVACIZUMAB-AWWB CHEMO INJECTION 400 MG/16ML
15.0000 mg/kg | Freq: Once | INTRAVENOUS | Status: AC
  Administered 2023-05-13: 1400 mg via INTRAVENOUS
  Filled 2023-05-13: qty 48

## 2023-05-13 MED ORDER — HEPARIN SOD (PORK) LOCK FLUSH 100 UNIT/ML IV SOLN
500.0000 [IU] | Freq: Once | INTRAVENOUS | Status: AC
  Administered 2023-05-13: 500 [IU] via INTRAVENOUS

## 2023-05-13 NOTE — Progress Notes (Addendum)
Patient presents today for MVASI infusion. Patient is in satisfactory condition with no new complaints voiced.  Vital signs are stable.  Labs reviewed and all labs are within treatment parameters.  We will proceed with treatment per MD orders.    Patient's port did not give blood for infusion today, Altelplase was placed in port per protocol, checked , no blood, another 30 no blood. RN  flushed port with saline for a fast flush, patient does taste saline when port is being flushed, and no resistance is met. After 1 hour of Alteplase placed no blood return noted. Rebekah Pennington-PA-C made aware. Okay to proceed with MVASI and patient will be sent down for a dye study after treatment today. Patient made aware and verbalized understanding.  MVASI given today per MD orders. Tolerated infusion without adverse affects. Vital signs stable. No complaints at this time. Discharged from clinic ambulatory in stable condition. Alert and oriented x 3. F/U with [facility] as scheduled.

## 2023-05-13 NOTE — Patient Instructions (Signed)
[facility] [place] - A [facility]  Discharge Instructions: Thank you for choosing [facility] to provide your oncology and hematology care.  If you have a lab appointment with the Cancer Center - please note that after April 8th, 2024, all labs will be drawn in the cancer center.  You do not have to check in or register with the main entrance as you have in the past but will complete your check-in in the cancer center.  Wear comfortable clothing and clothing appropriate for easy access to any Portacath or PICC line.   We strive to give you quality time with your provider. You may need to reschedule your appointment if you arrive late (15 or more minutes).  Arriving late affects you and other patients whose appointments are after yours.  Also, if you miss three or more appointments without notifying the office, you may be dismissed from the clinic at the provider's discretion.      For prescription refill requests, have your pharmacy contact our office and allow 72 hours for refills to be completed.    Today you received the following chemotherapy and/or immunotherapy agents MVASI   To help prevent nausea and vomiting after your treatment, we encourage you to take your nausea medication as directed.  Bevacizumab Injection What is this medication? BEVACIZUMAB (be va SIZ yoo mab) treats some types of cancer. It works by blocking a protein that causes cancer cells to grow and multiply. This helps to slow or stop the spread of cancer cells. It is a monoclonal antibody. This medicine may be used for other purposes; ask your health care provider or pharmacist if you have questions. COMMON BRAND NAME(S): Alymsys, Avastin, MVASI, Omer Jack What should I tell my care team before I take this medication? They need to know if you have any of these conditions: Blood clots Coughing up blood Having or recent surgery Heart failure High blood pressure History of  a connection between 2 or more body parts that do not usually connect (fistula) History of a tear in your stomach or intestines Protein in your urine An unusual or allergic reaction to bevacizumab, other medications, foods, dyes, or preservatives Pregnant or trying to get pregnant Breast-feeding How should I use this medication? This medication is injected into a vein. It is given by your care team in a hospital or clinic setting. Talk to your care team the use of this medication in children. Special care may be needed. Overdosage: If you think you have taken too much of this medicine contact a poison control center or emergency room at once. NOTE: This medicine is only for you. Do not share this medicine with others. What if I miss a dose? Keep appointments for follow-up doses. It is important not to miss your dose. Call your care team if you are unable to keep an appointment. What may interact with this medication? Interactions are not expected. This list may not describe all possible interactions. Give your health care provider a list of all the medicines, herbs, non-prescription drugs, or dietary supplements you use. Also tell them if you smoke, drink alcohol, or use illegal drugs. Some items may interact with your medicine. What should I watch for while using this medication? Your condition will be monitored carefully while you are receiving this medication. You may need blood work while taking this medication. This medication may make you feel generally unwell. This is not uncommon as chemotherapy can affect  healthy cells as well as cancer cells. Report any side effects. Continue your course of treatment even though you feel ill unless your care team tells you to stop. This medication may increase your risk to bruise or bleed. Call your care team if you notice any unusual bleeding. Before having surgery, talk to your care team to make sure it is ok. This medication can increase the risk of  poor healing of your surgical site or wound. You will need to stop this medication for 28 days before surgery. After surgery, wait at least 28 days before restarting this medication. Make sure the surgical site or wound is healed enough before restarting this medication. Talk to your care team if questions. Talk to your care team if you may be pregnant. Serious birth defects can occur if you take this medication during pregnancy and for 6 months after the last dose. Contraception is recommended while taking this medication and for 6 months after the last dose. Your care team can help you find the option that works for you. Do not breastfeed while taking this medication and for 6 months after the last dose. This medication can cause infertility. Talk to your care team if you are concerned about your fertility. What side effects may I notice from receiving this medication? Side effects that you should report to your care team as soon as possible: Allergic reactions--skin rash, itching, hives, swelling of the face, lips, tongue, or throat Bleeding--bloody or black, tar-like stools, vomiting blood or brown material that looks like coffee grounds, red or dark brown urine, small red or purple spots on skin, unusual bruising or bleeding Blood clot--pain, swelling, or warmth in the leg, shortness of breath, chest pain Heart attack--pain or tightness in the chest, shoulders, arms, or jaw, nausea, shortness of breath, cold or clammy skin, feeling faint or lightheaded Heart failure--shortness of breath, swelling of the ankles, feet, or hands, sudden weight gain, unusual weakness or fatigue Increase in blood pressure Infection--fever, chills, cough, sore throat, wounds that don't heal, pain or trouble when passing urine, general feeling of discomfort or being unwell Infusion reactions--chest pain, shortness of breath or trouble breathing, feeling faint or lightheaded Kidney injury--decrease in the amount of urine,  swelling of the ankles, hands, or feet Stomach pain that is severe, does not go away, or gets worse Stroke--sudden numbness or weakness of the face, arm, or leg, trouble speaking, confusion, trouble walking, loss of balance or coordination, dizziness, severe headache, change in vision Sudden and severe headache, confusion, change in vision, seizures, which may be signs of posterior reversible encephalopathy syndrome (PRES) Side effects that usually do not require medical attention (report to your care team if they continue or are bothersome): Back pain Change in taste Diarrhea Dry skin Increased tears Nosebleed This list may not describe all possible side effects. Call your doctor for medical advice about side effects. You may report side effects to FDA at 1-800-FDA-1088. Where should I keep my medication? This medication is given in a hospital or clinic. It will not be stored at home. NOTE: This sheet is a summary. It may not cover all possible information. If you have questions about this medicine, talk to your doctor, pharmacist, or health care provider.  2024 Elsevier/Gold Standard (2021-10-06 00:00:00)   BELOW ARE SYMPTOMS THAT SHOULD BE REPORTED IMMEDIATELY: *FEVER GREATER THAN 100.4 F (38 C) OR HIGHER *CHILLS OR SWEATING *NAUSEA AND VOMITING THAT IS NOT CONTROLLED WITH YOUR NAUSEA MEDICATION *UNUSUAL SHORTNESS OF BREATH *UNUSUAL BRUISING  OR BLEEDING *URINARY PROBLEMS (pain or burning when urinating, or frequent urination) *BOWEL PROBLEMS (unusual diarrhea, constipation, pain near the anus) TENDERNESS IN MOUTH AND THROAT WITH OR WITHOUT PRESENCE OF ULCERS (sore throat, sores in mouth, or a toothache) UNUSUAL RASH, SWELLING OR PAIN  UNUSUAL VAGINAL DISCHARGE OR ITCHING   Items with * indicate a potential emergency and should be followed up as soon as possible or go to the Emergency Department if any problems should occur.  Please show the CHEMOTHERAPY ALERT CARD or  IMMUNOTHERAPY ALERT CARD at check-in to the Emergency Department and triage nurse.  Should you have questions after your visit or need to cancel or reschedule your appointment, please contact [facility] [place] - A DEPT OF Eligha Bridegroom [facility] [phone]  and follow the prompts.  Office hours are 8:00 a.m. to 4:30 p.m. Monday - Friday. Please note that voicemails left after 4:00 p.m. may not be returned until the following business day.  We are closed weekends and major holidays. You have access to a nurse at all times for urgent questions. Please call the main number to the clinic [phone] and follow the prompts.  For any non-urgent questions, you may also contact your provider using MyChart. We now offer e-Visits for anyone 102 and older to request care online for non-urgent symptoms. For details visit [email or web address].   Also download the MyChart app! Go to the app store, search "MyChart", open the app, select [place], and log in with your MyChart username and password.

## 2023-05-24 ENCOUNTER — Ambulatory Visit (HOSPITAL_COMMUNITY)
Admission: RE | Admit: 2023-05-24 | Discharge: 2023-05-24 | Disposition: A | Discharge status: Home / Self Care | Payer: Commercial Managed Care - PPO | Source: Ambulatory Visit | Attending: Hematology | Admitting: Hematology

## 2023-05-24 DIAGNOSIS — C482 Malignant neoplasm of peritoneum, unspecified: Secondary | ICD-10-CM | POA: Diagnosis present

## 2023-05-24 MED ORDER — IOHEXOL 300 MG/ML  SOLN
100.0000 mL | Freq: Once | INTRAMUSCULAR | Status: AC | PRN
  Administered 2023-05-24: 100 mL via INTRAVENOUS

## 2023-05-25 ENCOUNTER — Other Ambulatory Visit: Payer: Self-pay | Admitting: Hematology

## 2023-05-27 ENCOUNTER — Encounter: Payer: Self-pay | Admitting: Hematology

## 2023-05-31 ENCOUNTER — Inpatient Hospital Stay (HOSPITAL_BASED_OUTPATIENT_CLINIC_OR_DEPARTMENT_OTHER): Payer: Commercial Managed Care - PPO | Admitting: Gynecologic Oncology

## 2023-05-31 ENCOUNTER — Encounter: Payer: Self-pay | Admitting: Gynecologic Oncology

## 2023-05-31 VITALS — BP 127/58 | HR 87 | Temp 98.0°F | Resp 17 | Ht 62.0 in | Wt 205.4 lb

## 2023-05-31 DIAGNOSIS — C800 Disseminated malignant neoplasm, unspecified: Secondary | ICD-10-CM

## 2023-05-31 DIAGNOSIS — C482 Malignant neoplasm of peritoneum, unspecified: Secondary | ICD-10-CM | POA: Diagnosis not present

## 2023-05-31 DIAGNOSIS — Z5112 Encounter for antineoplastic immunotherapy: Secondary | ICD-10-CM | POA: Diagnosis not present

## 2023-05-31 NOTE — Patient Instructions (Signed)
It was good to see you today.  I do not see or feel any evidence of cancer recurrence on your exam.  I will see you for follow-up in 6 months.  We talked about your small incisional hernia today.  If you begin having more symptoms related to this or pain, please call and let me know.  We can send you back to Dr. Lovell Sheehan to talk about surgery.  As we discussed, if you were to feel a bulge that became firm and could not be pushed back in, this would be a reason to come to the emergency department immediately.  As always, if you develop any new and concerning symptoms before your next visit, please call to see me sooner.

## 2023-05-31 NOTE — Progress Notes (Addendum)
Gynecologic Oncology Return Clinic Visit  05/31/23  Reason for Visit: surveillance   Treatment History: Oncology History  Malignant neoplasm of ovary (HCC)  11/15/2021 Initial Diagnosis   Malignant neoplasm of ovary (HCC)   01/07/2023 -  Chemotherapy   Patient is on Treatment Plan : OVARIAN Bevacizumab q21d     Primary peritoneal carcinomatosis (HCC)  11/13/2021 Imaging   CT A/P: 1. Trace ascites throughout the abdomen and pelvis, with extensive peritoneal thickening, nodularity, and omental caking. Findings are consistent with widespread peritoneal and omental metastatic disease. 2. No evidence of primary malignancy in the chest, abdomen, or pelvis, with specific attention to the most likely etiologies of peritoneal metastatic disease including pancreatic, ovarian, and endometrial malignancy. Although there is no obvious mass, pelvic ultrasound may be helpful to more closely assess the female reproductive organs. 3. Small left pleural effusion and associated atelectasis or consolidation, nonspecific although worrisome for malignant effusion. No overt pleural thickening or nodularity. 4. Scattered areas of nonspecific infectious or inflammatory alveolar ground-glass airspace opacity throughout the lungs. 5. Cholelithiasis without evidence of acute cholecystitis. 6. Nonobstructive right nephrolithiasis. 7. Postoperative findings of same day laparoscopy including minimal pneumoperitoneum.   Aortic Atherosclerosis (ICD10-I70.0).   11/13/2021 Surgery   Diagnostic laparoscopy with biopsy Dr. Lovell Sheehan Findings: Significant bloody ascites, studding of the omentum as well as the upper abdominal wall and diaphragm.  Nodularity noted along the small intestine.  Omental disease inhibited view of the pelvis.   11/17/2021 Initial Diagnosis   Primary peritoneal carcinomatosis (HCC)   11/21/2021 - 01/25/2022 Chemotherapy   Patient is on Treatment Plan : OVARIAN Carboplatin (AUC 6) /  Paclitaxel (175) q21d x 6 cycles     11/21/2021 - 12/17/2022 Chemotherapy   Patient is on Treatment Plan : OVARIAN Carboplatin (AUC 6) + Paclitaxel (175)  + Mvasi q21d X 6 Cycles     01/19/2022 Imaging   CT A/P: 1. Small amount of adherent thrombus or fibrin sheath around the catheter in the LEFT brachiocephalic vein. 2. Small low-attenuation area in the inter lobar fissure region associated with medial segment of LEFT hepatic lobe, new from previous imaging, may represent developing "variable fat deposition" though given history of peritoneal disease would suggest attention on subsequent imaging. 3. Diminished thickening of mesenteric reflections, decreased ascites and decreased omental infiltration since previous imaging. 4. Focal concentric narrowing of the rectosigmoid junction is nonspecific but suspicious for neoplasm potentially related to serosal involvement in the pelvis in this patient with known peritoneal disease from ovarian cancer. Underlying colonic neoplasm would be difficult to exclude as well. Colonic assessment with endoscopy or comparison with recent endoscopy performed is suggested. 5. Cholelithiasis without evidence of acute cholecystitis. 6. 4 mm RIGHT lower pole renal calculus.   Aortic Atherosclerosis (ICD10-I70.0).   03/06/2022 Imaging   CT C/A/P: Chest CT:   Scattered subsegmental pulmonary emboli in the right lung.   Abdominal CT:   1. Mild right hydroureteronephrosis from a 4 mm UVJ calculus. 2. Unchanged omental disease. 3. Cholelithiasis and other chronic findings described above.   05/24/2022 Imaging   Increased peritoneal carcinomatosis and mild ascites.   New moderate distal small bowel obstruction, with transition point in the central pelvis adjacent to the uterine fundus, which may be due to peritoneal carcinomatosis or adhesion.   Cholelithiasis. No radiographic evidence of cholecystitis.   Aortic Atherosclerosis (ICD10-I70.0).    08/21/2022 Imaging   CT A/P: 1. Improved peritoneal carcinomatosis and mild ascites. 2. Prominent/mildly enlarged loops of small bowel throughout the abdomen  with transition point in the pelvis, not significantly changed from prior and compatible with a least low-grade obstruction. 3. Cholelithiasis without findings of acute cholecystitis.   10/01/2022 Surgery   Diagnostic laparoscopy, robotic assisted laparoscopic lysis of adhesions, exploratory laparotomy, omentectomy   Findings: On EUA, small uterus within minimal mobility. On intra-abdominal entry, omentum adherent to the anterior abdominal wall at the level of the transverse colon. Some adhesions noted between the anterior liver surface and the diaphragm on the right. Small miliary disease noted on bilateral diaphragms, small bowel, small bowel mesentery, abdominal and pelvic peritoneum. No ascites. Complete retroperitonealization of the pelvic organs. Multiple loops of small bowel were densely adherent to the uterine fundus without obvious obstruction. Terminal ileum and cecum also adherent along the right pelvic sidewall. Sigmoid colon densely adherent to a loop of ileum and to the lateral aspect of the retroperitonealized pelvis on the left. After opening the retroperitoneum bilaterally and transecting the round ligaments, the bladder flap was able to be partially developed anteriorly. This revealed dense adhesions of the sigmoid to the lateral uterus. Given prior discussions with the patient, I did not feel that proceeding with further attempt at lysis of adhesions (some mobilization of two loops of ileum adherent to the uterine fundus was performed) would be possible without significant risk of injury to multiple loops of small bowel and to the sigmoid colon. 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 On laparotomy, 8 x 4 cm omental cake noted. No disease visualized or palpated within the  lesser sac. There was no palpable disease along the liver edges or spleen. Stomach was normal in appearance. Some miliary disease palpated along the abdominal peritoneum. Numerous miliary disease noted along the small bowel and small bowel mesentery. Numerous prominent lymph nodes (1cm or less) within the stalk of the small bowel mesentery.  R1 resection based on miliary disease (in the setting of incomplete pelvic evaluation).   10/01/2022 Pathology Results   A. Pelvic sidewall, right, excision - High-grade serous carcinoma   B. Omentum, resection - High-grade serous carcinoma - Treatment effect present - See comment   C. Small bowel, implant, excision - Infarcted epiploic appendage - Negative for carcinoma  Immunohistochemical stains were performed on block B1. The tumor cells are positive for WT1, compatible with tubo-ovarian or peritoneal origin. p53 demonstrates faint positivity in rare cells, favored to be most consistent with a null pattern.    11/05/2022 Genetic Testing   Negative genetic testing on the [facility] Multi-cancer + RNA panel.  The report date is November 05, 2022.  The Multi-Cancer + RNA Panel offered by [facility] includes sequencing and/or deletion/duplication analysis of the following 70 genes:  AIP*, ALK, APC*, ATM*, AXIN2*, BAP1*, BARD1*, BLM*, BMPR1A*, BRCA1*, BRCA2*, BRIP1*, CDC73*, CDH1*, CDK4, CDKN1B*, CDKN2A, CHEK2*, CTNNA1*, DICER1*, EPCAM (del/dup only), EGFR, FH*, FLCN*, GREM1 (promoter dup only), HOXB13, KIT, LZTR1, MAX*, MBD4, MEN1*, MET, MITF, MLH1*, MSH2*, MSH3*, MSH6*, MUTYH*, NF1*, NF2*, NTHL1*, PALB2*, PDGFRA, PMS2*, POLD1*, POLE*, POT1*, PRKAR1A*, PTCH1*, PTEN*, RAD51C*, RAD51D*, RB1*, RET, SDHA* (sequencing only), SDHAF2*, SDHB*, SDHC*, SDHD*, SMAD4*, SMARCA4*, SMARCB1*, SMARCE1*, STK11*, SUFU*, TMEM127*, TP53*, TSC1*, TSC2*, VHL*. RNA analysis is performed for * genes.    01/07/2023 -  Chemotherapy   Patient is on Treatment Plan : OVARIAN Bevacizumab q21d       Started PARPi in 01/2023.  Interval History: Overall doing well.  Endorses baseline bowel and bladder function.  Continues to have some right upper side pain, unchanged since prior  to her diagnosis.  Within the last week felt a bulge near her umbilicus, nontender.  Has some pain across the midportion of her abdomen when she coughs hard or sneezes.  Denies any vaginal bleeding or discharge.  Past Medical/Surgical History: Past Medical History:  Diagnosis Date   Acute respiratory disease due to COVID-19 virus 02/02/2019   Anxiety    Cancer (HCC)    Depression    Dyspnea    Dysrhythmia    Family history of bladder cancer    Family history of breast cancer    History of diabetes mellitus    Resolved in Sept 2023 with weight loss.   Hypertension    Ovarian cancer (HCC) 11/14/2021   Peritoneal carcinomatosis (HCC)    Pneumonia    Sleep apnea    doesn't use CPAP   Tachycardia     Past Surgical History:  Procedure Laterality Date   KNEE SURGERY Right    Arthroscopic   LAPAROSCOPIC ABDOMINAL EXPLORATION N/A 11/13/2021   Procedure: Exploratory laparoscopy;  Surgeon: Franky Macho, MD;  Location: [facility];  Service: General;  Laterality: N/A;   PORTACATH PLACEMENT Left 11/15/2021   Procedure: INSERTION PORT-A-CATH;  Surgeon: Franky Macho, MD;  Location: [facility];  Service: General;  Laterality: Left;   TONSILLECTOMY AND ADENOIDECTOMY      Family History  Problem Relation Age of Onset   Hypertension Mother    Heart attack Paternal Uncle    Bladder Cancer Maternal Grandmother 58       non-smoker   Dementia Maternal Grandfather    Breast cancer Maternal Great-grandmother        MGF's mother   Colon cancer Neg Hx    Ovarian cancer Neg Hx    Endometrial cancer Neg Hx    Pancreatic cancer Neg Hx    Prostate cancer Neg Hx     Social History   Socioeconomic History   Marital status: Married    Spouse name: Not on file   Number of children: Not on file   Years of education: Not  on file   Highest education level: Not on file  Occupational History   Occupation: Unemployed  Tobacco Use   Smoking status: Never   Smokeless tobacco: Never  Vaping Use   Vaping status: Never Used  Substance and Sexual Activity   Alcohol use: No   Drug use: No   Sexual activity: Not Currently  Other Topics Concern   Not on file  Social History Narrative   Not on file   Social Drivers of Health   Financial Resource Strain: Not on file  Food Insecurity: No Food Insecurity (03/06/2022)   Hunger Vital Sign    Worried About Running Out of Food in the Last Year: Never true    Ran Out of Food in the Last Year: Never true  Transportation Needs: No Transportation Needs (03/06/2022)   PRAPARE - Administrator, Civil Service (Medical): No    Lack of Transportation (Non-Medical): No  Physical Activity: Not on file  Stress: Not on file  Social Connections: Not on file    Current Medications:  Current Outpatient Medications:    apixaban (ELIQUIS) 5 MG TABS tablet, Take 1 tablet (5 mg total) by mouth 2 (two) times daily., Disp: 180 tablet, Rfl: 3   atorvastatin (LIPITOR) 20 MG tablet, Take 1 tablet (20 mg total) by mouth at bedtime., Disp: 90 tablet, Rfl: 3   diphenoxylate-atropine (LOMOTIL) 2.5-0.025 MG tablet, Take 2 tablets by mouth  after the first watery bowel movement. May then take 1 pill by mouth after each watery bowel movement. Do not exceed 8 pills in a 24 hour period., Disp: 60 tablet, Rfl: 3   DULoxetine (CYMBALTA) 30 MG capsule, TAKE 1 CAPSULE BY MOUTH DAILY FOR 7 DAYS, THEN INCREASE TO 2 CAPSULES DAILY, Disp: 60 capsule, Rfl: 5   lidocaine-prilocaine (EMLA) cream, Apply a quarter sized amount to port a cath site and cover with plastic wrap 1 hour prior to infusion appointments, Disp: 30 g, Rfl: 3   LYNPARZA 150 MG tablet, TAKE 2 TABLETS TWICE A DAY. MAY TAKE WITH FOOD TO DECEASE NAUSEA AND VOMITING. (SWALLOW WHOLE), Disp: 360 tablet, Rfl: 0   Multiple Vitamin  (MULTIVITAMIN) capsule, Take 1 capsule by mouth daily., Disp: , Rfl:    Potassium 99 MG TABS, Take 99 mg by mouth at bedtime., Disp: , Rfl:    magnesium oxide (MAG-OX) 400 (240 Mg) MG tablet, TAKE 1 TABLET BY MOUTH TWICE A DAY, Disp: 180 tablet, Rfl: 1 No current facility-administered medications for this visit.  Facility-Administered Medications Ordered in Other Visits:    heparin lock flush 100 unit/mL, 500 Units, Intravenous, Once, [name], MD   sodium chloride flush (NS) 0.9 % injection 10 mL, 10 mL, Intravenous, PRN, [name], MD  Review of Systems: Denies appetite changes, fevers, chills, fatigue, unexplained weight changes. Denies hearing loss, neck lumps or masses, mouth sores, ringing in ears or voice changes. Denies cough or wheezing.  Denies shortness of breath. Denies chest pain or palpitations. Denies leg swelling. Denies abdominal distention, pain, blood in stools, constipation, diarrhea, nausea, vomiting, or early satiety. Denies pain with intercourse, dysuria, frequency, hematuria or incontinence. Denies hot flashes, pelvic pain, vaginal bleeding or vaginal discharge.   Denies joint pain, back pain or muscle pain/cramps. Denies itching, rash, or wounds. Denies dizziness, headaches, numbness or seizures. Denies swollen lymph nodes or glands, denies easy bruising or bleeding. Denies anxiety, depression, confusion, or decreased concentration.  Physical Exam: BP (!) 127/58 (BP Location: Right Arm, Patient Position: Sitting, Cuff Size: Large)   Pulse 87   Temp 98 F (36.7 C) (Temporal)   Resp 17   Ht 5\' 2"  (1.575 m)   Wt 205 lb 6.4 oz (93.2 kg)   SpO2 99%   BMI 37.57 kg/m  General: Alert, oriented, no acute distress. HEENT: Normocephalic, atraumatic, sclera anicteric. Chest: Clear to auscultation bilaterally.  No wheezes or rhonchi. Cardiovascular: Regular rate and rhythm, no murmurs. Abdomen: Obese, soft, nontender.  Normoactive bowel sounds.   No masses or hepatosplenomegaly appreciated.  Well-healed incisions.  2 cm bulge just lateral and superior to the umbilicus along the incision, nontender. Extremities: Grossly normal range of motion.  Warm, well perfused.  No edema bilaterally. Lymphatics: No cervical, supraclavicular adenopathy. GU: Declined.  Laboratory & Radiologic Studies: CT A/P 05/24/23: 1. Similar slightly heterogeneous appearance of the uterus with tethered appearance of the terminal ileum and sigmoid colon to the no evidence of new or progressive disease in the abdomen or pelvis. 2. Cholelithiasis without findings of acute cholecystitis. 3.  Aortic Atherosclerosis (ICD10-I70.0).  Component Ref Range & Units (hover) 2 mo ago 3 mo ago 4 mo ago 6 mo ago 10 mo ago 11 mo ago 1 yr ago  Cancer Antigen (CA) 125 18.4 11.7 CM 8.9 CM 11.2 CM 11.6 CM 16.0 CM 20.4    Assessment & Plan: SARASWATI SCHEMPP is a 52 y.o. woman with Stage IVA high grade serous presumed primary peritoneal  carcinoma who presents for follow-up. S/p 3 cycles of adj platinum-based therapy after surgery, now on maintenance avastin/olaparib in the setting of HRD+ tumor. P53 mutated.    The patient is overall doing well.    We discussed her abdominal symptoms.  We looked at her recent CT scan together.  I think based on exam and her imaging, she has a small incisional hernia right at the level of the umbilicus.  This is relatively asymptomatic other than when she coughs or sneezes.  Discussed precautions and when to present to the emergency department.  At this time, she prefers to continue with monitoring symptoms.  If she were to become symptomatic, I asked her to reach out.  We can get her back into see Dr. Lovell Sheehan to talk about possible repair as well as concurrent cholecystectomy.  We looked at recent CT report together.  Reviewed findings.  Small bump in her CA125 2 months ago.  She will have this drawn again after her next visit with medical oncologist.   Discussed that there systemic reasons that there can be some fluctuation in this tumor marker that are unrelated to cancer.  Her preference is to return to see me in 6 months for follow-up.  20 minutes of total time was spent for this patient encounter, including preparation, face-to-face counseling with the patient and coordination of care, and documentation of the encounter.  Eugene Garnet, MD  Division of Gynecologic Oncology  Department of Obstetrics and Gynecology  [facility]

## 2023-06-02 ENCOUNTER — Other Ambulatory Visit: Payer: Self-pay

## 2023-06-03 ENCOUNTER — Inpatient Hospital Stay: Payer: Commercial Managed Care - PPO

## 2023-06-03 ENCOUNTER — Other Ambulatory Visit: Payer: Self-pay | Admitting: Hematology

## 2023-06-03 ENCOUNTER — Inpatient Hospital Stay (HOSPITAL_BASED_OUTPATIENT_CLINIC_OR_DEPARTMENT_OTHER): Payer: Commercial Managed Care - PPO | Admitting: Hematology

## 2023-06-03 VITALS — Wt 205.6 lb

## 2023-06-03 VITALS — BP 150/52 | HR 80 | Resp 17

## 2023-06-03 VITALS — BP 130/72 | HR 96 | Temp 97.0°F | Resp 19

## 2023-06-03 DIAGNOSIS — C569 Malignant neoplasm of unspecified ovary: Secondary | ICD-10-CM

## 2023-06-03 DIAGNOSIS — C482 Malignant neoplasm of peritoneum, unspecified: Secondary | ICD-10-CM

## 2023-06-03 DIAGNOSIS — Z5112 Encounter for antineoplastic immunotherapy: Secondary | ICD-10-CM | POA: Diagnosis not present

## 2023-06-03 LAB — COMPREHENSIVE METABOLIC PANEL
ALT: 14 U/L (ref 0–44)
AST: 18 U/L (ref 15–41)
Albumin: 3.4 g/dL — ABNORMAL LOW (ref 3.5–5.0)
Alkaline Phosphatase: 51 U/L (ref 38–126)
Anion gap: 9 (ref 5–15)
BUN: 15 mg/dL (ref 6–20)
CO2: 24 mmol/L (ref 22–32)
Calcium: 8.9 mg/dL (ref 8.9–10.3)
Chloride: 103 mmol/L (ref 98–111)
Creatinine, Ser: 0.81 mg/dL (ref 0.44–1.00)
GFR, Estimated: >60 mL/min (ref >60)
Glucose, Bld: 121 mg/dL — ABNORMAL HIGH (ref 70–99)
Potassium: 3.8 mmol/L (ref 3.5–5.1)
Sodium: 136 mmol/L (ref 135–145)
Total Bilirubin: 1.2 mg/dL (ref 0.0–1.2)
Total Protein: 6.3 g/dL — ABNORMAL LOW (ref 6.5–8.1)

## 2023-06-03 LAB — CBC WITH DIFFERENTIAL/PLATELET
Abs Immature Granulocytes: 0.05 10*3/uL (ref 0.00–0.07)
Basophils Absolute: 0 10*3/uL (ref 0.0–0.1)
Basophils Relative: 0 %
Eosinophils Absolute: 0.1 10*3/uL (ref 0.0–0.5)
Eosinophils Relative: 2 %
HCT: 34.3 % — ABNORMAL LOW (ref 36.0–46.0)
Hemoglobin: 11.6 g/dL — ABNORMAL LOW (ref 12.0–15.0)
Immature Granulocytes: 1 %
Lymphocytes Relative: 19 %
Lymphs Abs: 1.1 10*3/uL (ref 0.7–4.0)
MCH: 38.9 pg — ABNORMAL HIGH (ref 26.0–34.0)
MCHC: 33.8 g/dL (ref 30.0–36.0)
MCV: 115.1 fL — ABNORMAL HIGH (ref 80.0–100.0)
Monocytes Absolute: 0.5 10*3/uL (ref 0.1–1.0)
Monocytes Relative: 8 %
Neutro Abs: 4 10*3/uL (ref 1.7–7.7)
Neutrophils Relative %: 70 %
Platelets: 134 10*3/uL — ABNORMAL LOW (ref 150–400)
RBC: 2.98 MIL/uL — ABNORMAL LOW (ref 3.87–5.11)
RDW: 16.6 % — ABNORMAL HIGH (ref 11.5–15.5)
WBC: 5.7 10*3/uL (ref 4.0–10.5)
nRBC: 0.7 % — ABNORMAL HIGH (ref 0.0–0.2)

## 2023-06-03 LAB — MAGNESIUM: Magnesium: 1.7 mg/dL (ref 1.7–2.4)

## 2023-06-03 MED ORDER — SODIUM CHLORIDE 0.9% FLUSH
10.0000 mL | Freq: Once | INTRAVENOUS | Status: AC
  Administered 2023-06-03: 10 mL via INTRAVENOUS

## 2023-06-03 MED ORDER — ALTEPLASE 2 MG IJ SOLR: 2.0000 mg | Freq: Once | INTRAMUSCULAR | Status: DC | PRN

## 2023-06-03 MED ORDER — SODIUM CHLORIDE 0.9 % IV SOLN: Freq: Once | INTRAVENOUS | Status: AC

## 2023-06-03 MED ORDER — HEPARIN SOD (PORK) LOCK FLUSH 100 UNIT/ML IV SOLN
500.0000 [IU] | Freq: Once | INTRAVENOUS | Status: AC | PRN
  Administered 2023-06-03: 500 [IU]

## 2023-06-03 MED ORDER — SODIUM CHLORIDE 0.9 % IV SOLN
15.0000 mg/kg | Freq: Once | INTRAVENOUS | Status: AC
  Administered 2023-06-03: 1400 mg via INTRAVENOUS
  Filled 2023-06-03: qty 48

## 2023-06-03 NOTE — Patient Instructions (Signed)
[facility] at [facility] Discharge Instructions   You were seen and examined today by Dr. Ellin Saba.  He reviewed the results of your lab work which are normal/stable.   He reviewed the results of your CT scan which was stable.   We will proceed with your treatment today.   Continue Lynparza as prescribed.   Return as scheduled.    Thank you for choosing [facility] at [facility] to provide your oncology and hematology care.  To afford each patient quality time with our provider, please arrive at least 15 minutes before your scheduled appointment time.   If you have a lab appointment with the Cancer Center please come in thru the Main Entrance and check in at the main information desk.  You need to re-schedule your appointment should you arrive 10 or more minutes late.  We strive to give you quality time with our providers, and arriving late affects you and other patients whose appointments are after yours.  Also, if you no show three or more times for appointments you may be dismissed from the clinic at the providers discretion.     Again, thank you for choosing [facility].  Our hope is that these requests will decrease the amount of time that you wait before being seen by our physicians.       _____________________________________________________________  Should you have questions after your visit to [facility], please contact our office at [phone] and follow the prompts.  Our office hours are 8:00 a.m. and 4:30 p.m. Monday - Friday.  Please note that voicemails left after 4:00 p.m. may not be returned until the following business day.  We are closed weekends and major holidays.  You do have access to a nurse 24-7, just call the main number to the clinic [phone] and do not press any options, hold on the line and a nurse will answer the phone.    For prescription refill requests, have your pharmacy  contact our office and allow 72 hours.    Due to Covid, you will need to wear a mask upon entering the hospital. If you do not have a mask, a mask will be given to you at the Main Entrance upon arrival. For doctor visits, patients may have 1 support person age 42 or older with them. For treatment visits, patients can not have anyone with them due to social distancing guidelines and our immunocompromised population.

## 2023-06-03 NOTE — Progress Notes (Signed)
[facility] [address] [name] [address]    Clinic Day:  06/03/2023  Referring physician: [name], DO  Patient Care Team: [name], DO as PCP - General (Family Medicine) [name], MD as Referring Physician (Optometry) [name], MD as Medical Oncologist (Medical Oncology) [name], RN as Oncology Nurse Navigator (Medical Oncology)   ASSESSMENT & PLAN:   Assessment: 1. High-grade serous carcinoma of peritoneum: - She had developed sharp left-sided abdominal pain since Mother's Day, occasional radiating to the back.  40 pound weight loss in the last 6 months. - Personal history of cervical cancer in 2000, status post chemo plus XRT at [facility].  She has slightly decreased hearing in the left ear since then. - US abdomen (11/08/2021): Cholelithiasis with gallbladder wall thickening and sonographic Murphy sign.  Upper abdominal ascites. - Laparoscopy (11/13/2021): Starting of the omentum as well as the upper abdominal wall and diaphragm.  Liver was within normal limits.  6 L of ascites was evacuated.  Omental and peritoneal biopsies were sent.  Peritoneal implants throughout and a significant portion of omentum and on the small intestine. - CT CAP (11/13/2021): Ascites, extensive peritoneal thickening, nodularity and omental caking.  No evidence of primary malignancy in the chest, abdomen or pelvis.  Small left pleural effusion, nonspecific.  Cholelithiasis. - US pelvis (11/14/2021): Uterus is grossly unremarkable.  Ovaries not visualized. - CA125 (11/13/2021): 1269 - Pathology: Peritoneum and omental biopsy consistent with high-grade serous carcinoma.  HER2 2+ by IHC and negative by FISH. - 4 cycles of carboplatin and paclitaxel from 11/21/2021 through 01/23/2022, cycle 9 on 08/29/2022. - 10/01/2022: Diagnostic laparoscopy, exploratory laparotomy, omentectomy - Pathology: Right pelvic sidewall excision-high-grade  serous carcinoma, omental resection-high-grade serous carcinoma with treatment effect present.  Small bowel implant excision was negative for carcinoma. - Germline mutation testing negative - Foundation 1 NGS (10/01/2022): LOH-18.9%, HRD +, MS-stable, TMB-low - Olaparib 300 mg twice daily and bevacizumab maintenance started on 01/07/2023  2. Social/family history: - Lives at home with her husband.  Worked in Engineering geologist at [employer].  Non-smoker. - Maternal grandmother with bladder cancer.  Maternal great grandmother with breast cancer.    Plan: High-grade serous carcinoma of peritoneum: - She is tolerating olaparib and bevacizumab reasonably well. - Reviewed labs from today: Normal LFTs.  Creatinine normal.  CBC grossly normal.  CA125 was 18.4. - CTAP on 05/24/2023: Stable heterogeneous appearance of the uterus with tethered appearance of the terminal ileum and sigmoid colon with no new or progressive disease. - She does not have blood return from the port.  We have done portogram which showed the catheter tip is in the left innominate vein, slightly above the normal site.  We will continue to use the port and draw blood peripherally. - Recommend continuing olaparib 300 mg twice daily.  Recommend continue bevacizumab every 3 weeks.  RTC 9 weeks for follow-up.   2.  Peripheral neuropathy: - Continue Cymbalta 60 mg daily.  She reports numbness in the bottom of the feet.  Does not have any neuropathic pains.  Hence I did not recommend adding gabapentin.  She reports that she cannot tolerate Lyrica.  3.  Hypomagnesemia: - Continue magnesium 3 times daily.  Magnesium is normal today.  4.  Hypokalemia: - Continue potassium 20 mill equivalents daily.  Potassium is 3.8.   5.  Subsegmental pulmonary embolism (Dx 03/06/2022): - Continue Eliquis twice daily.  No bleeding issues.  No orders of the defined types were placed in this encounter.      [name], MD   12/30/20241:13 PM  CHIEF  COMPLAINT:   Diagnosis: high-grade serous carcinoma of peritoneum    Cancer Staging  Primary peritoneal carcinomatosis [facility]) Staging form: Ovary, Fallopian Tube, and Primary Peritoneal Carcinoma, AJCC 8th Edition - Clinical stage from 11/17/2021: FIGO Stage IIIC (cT3c, cM0) - Unsigned    Prior Therapy: Carboplatin and paclitaxel-- x 4 cycles 11/21/2021 - 01/23/2022, x5 cycles 06/05/22 - 08/29/22, x3 cycles 11/05/22 - 12/17/22   Current Therapy:  olaparib and maintenance bevacizumab    HISTORY OF PRESENT ILLNESS:   Oncology History  Malignant neoplasm of ovary (HCC)  11/15/2021 Initial Diagnosis   Malignant neoplasm of ovary (HCC)   01/07/2023 -  Chemotherapy   Patient is on Treatment Plan : OVARIAN Bevacizumab q21d     Primary peritoneal carcinomatosis (HCC)  11/13/2021 Imaging   CT A/P: 1. Trace ascites throughout the abdomen and pelvis, with extensive peritoneal thickening, nodularity, and omental caking. Findings are consistent with widespread peritoneal and omental metastatic disease. 2. No evidence of primary malignancy in the chest, abdomen, or pelvis, with specific attention to the most likely etiologies of peritoneal metastatic disease including pancreatic, ovarian, and endometrial malignancy. Although there is no obvious mass, pelvic ultrasound may be helpful to more closely assess the female reproductive organs. 3. Small left pleural effusion and associated atelectasis or consolidation, nonspecific although worrisome for malignant effusion. No overt pleural thickening or nodularity. 4. Scattered areas of nonspecific infectious or inflammatory alveolar ground-glass airspace opacity throughout the lungs. 5. Cholelithiasis without evidence of acute cholecystitis. 6. Nonobstructive right nephrolithiasis. 7. Postoperative findings of same day laparoscopy including minimal pneumoperitoneum.   Aortic Atherosclerosis (ICD10-I70.0).   11/13/2021 Surgery   Diagnostic laparoscopy  with biopsy Dr. Lovell Sheehan Findings: Significant bloody ascites, studding of the omentum as well as the upper abdominal wall and diaphragm.  Nodularity noted along the small intestine.  Omental disease inhibited view of the pelvis.   11/17/2021 Initial Diagnosis   Primary peritoneal carcinomatosis (HCC)   11/21/2021 - 01/25/2022 Chemotherapy   Patient is on Treatment Plan : OVARIAN Carboplatin (AUC 6) / Paclitaxel (175) q21d x 6 cycles     11/21/2021 - 12/17/2022 Chemotherapy   Patient is on Treatment Plan : OVARIAN Carboplatin (AUC 6) + Paclitaxel (175)  + Mvasi q21d X 6 Cycles     01/19/2022 Imaging   CT A/P: 1. Small amount of adherent thrombus or fibrin sheath around the catheter in the LEFT brachiocephalic vein. 2. Small low-attenuation area in the inter lobar fissure region associated with medial segment of LEFT hepatic lobe, new from previous imaging, may represent developing "variable fat deposition" though given history of peritoneal disease would suggest attention on subsequent imaging. 3. Diminished thickening of mesenteric reflections, decreased ascites and decreased omental infiltration since previous imaging. 4. Focal concentric narrowing of the rectosigmoid junction is nonspecific but suspicious for neoplasm potentially related to serosal involvement in the pelvis in this patient with known peritoneal disease from ovarian cancer. Underlying colonic neoplasm would be difficult to exclude as well. Colonic assessment with endoscopy or comparison with recent endoscopy performed is suggested. 5. Cholelithiasis without evidence of acute cholecystitis. 6. 4 mm RIGHT lower pole renal calculus.   Aortic Atherosclerosis (ICD10-I70.0).   03/06/2022 Imaging   CT C/A/P: Chest CT:   Scattered subsegmental pulmonary emboli in the right lung.   Abdominal CT:   1. Mild right hydroureteronephrosis from a 4  mm UVJ calculus. 2. Unchanged omental disease. 3. Cholelithiasis and other  chronic findings described above.   05/24/2022 Imaging   Increased peritoneal carcinomatosis and mild ascites.   New moderate distal small bowel obstruction, with transition point in the central pelvis adjacent to the uterine fundus, which may be due to peritoneal carcinomatosis or adhesion.   Cholelithiasis. No radiographic evidence of cholecystitis.   Aortic Atherosclerosis (ICD10-I70.0).   08/21/2022 Imaging   CT A/P: 1. Improved peritoneal carcinomatosis and mild ascites. 2. Prominent/mildly enlarged loops of small bowel throughout the abdomen with transition point in the pelvis, not significantly changed from prior and compatible with a least low-grade obstruction. 3. Cholelithiasis without findings of acute cholecystitis.   10/01/2022 Surgery   Diagnostic laparoscopy, robotic assisted laparoscopic lysis of adhesions, exploratory laparotomy, omentectomy   Findings: On EUA, small uterus within minimal mobility. On intra-abdominal entry, omentum adherent to the anterior abdominal wall at the level of the transverse colon. Some adhesions noted between the anterior liver surface and the diaphragm on the right. Small miliary disease noted on bilateral diaphragms, small bowel, small bowel mesentery, abdominal and pelvic peritoneum. No ascites. Complete retroperitonealization of the pelvic organs. Multiple loops of small bowel were densely adherent to the uterine fundus without obvious obstruction. Terminal ileum and cecum also adherent along the right pelvic sidewall. Sigmoid colon densely adherent to a loop of ileum and to the lateral aspect of the retroperitonealized pelvis on the left. After opening the retroperitoneum bilaterally and transecting the round ligaments, the bladder flap was able to be partially developed anteriorly. This revealed dense adhesions of the sigmoid to the lateral uterus. Given prior discussions with the patient, I did not feel that proceeding with further attempt at  lysis of adhesions (some mobilization of two loops of ileum adherent to the uterine fundus was performed) would be possible without significant risk of injury to multiple loops of small bowel and to the sigmoid colon. 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 On laparotomy, 8 x 4 cm omental cake noted. No disease visualized or palpated within the lesser sac. There was no palpable disease along the liver edges or spleen. Stomach was normal in appearance. Some miliary disease palpated along the abdominal peritoneum. Numerous miliary disease noted along the small bowel and small bowel mesentery. Numerous prominent lymph nodes (1cm or less) within the stalk of the small bowel mesentery.  R1 resection based on miliary disease (in the setting of incomplete pelvic evaluation).   10/01/2022 Pathology Results   A. Pelvic sidewall, right, excision - High-grade serous carcinoma   B. Omentum, resection - High-grade serous carcinoma - Treatment effect present - See comment   C. Small bowel, implant, excision - Infarcted epiploic appendage - Negative for carcinoma  Immunohistochemical stains were performed on block B1. The tumor cells are positive for WT1, compatible with tubo-ovarian or peritoneal origin. p53 demonstrates faint positivity in rare cells, favored to be most consistent with a null pattern.    11/05/2022 Genetic Testing   Negative genetic testing on the [facility] Multi-cancer + RNA panel.  The report date is November 05, 2022.  The Multi-Cancer + RNA Panel offered by [facility] includes sequencing and/or deletion/duplication analysis of the following 70 genes:  AIP*, ALK, APC*, ATM*, AXIN2*, BAP1*, BARD1*, BLM*, BMPR1A*, BRCA1*, BRCA2*, BRIP1*, CDC73*, CDH1*, CDK4, CDKN1B*, CDKN2A, CHEK2*, CTNNA1*, DICER1*, EPCAM (del/dup only), EGFR, FH*, FLCN*, GREM1 (promoter dup only), HOXB13, KIT, LZTR1, MAX*, MBD4, MEN1*, MET, MITF, MLH1*, MSH2*, MSH3*, MSH6*,  MUTYH*,  NF1*, NF2*, NTHL1*, PALB2*, PDGFRA, PMS2*, POLD1*, POLE*, POT1*, PRKAR1A*, PTCH1*, PTEN*, RAD51C*, RAD51D*, RB1*, RET, SDHA* (sequencing only), SDHAF2*, SDHB*, SDHC*, SDHD*, SMAD4*, SMARCA4*, SMARCB1*, SMARCE1*, STK11*, SUFU*, TMEM127*, TP53*, TSC1*, TSC2*, VHL*. RNA analysis is performed for * genes.    01/07/2023 -  Chemotherapy   Patient is on Treatment Plan : OVARIAN Bevacizumab q21d        INTERVAL HISTORY:   Cataleah is a 51 y.o. female seen for follow-up of high-grade serous carcinoma of the peritoneum.  Reports appetite and energy levels of 100%.  PAST MEDICAL HISTORY:   Past Medical History: Past Medical History:  Diagnosis Date   Acute respiratory disease due to COVID-19 virus 02/02/2019   Anxiety    Cancer (HCC)    Depression    Dyspnea    Dysrhythmia    Family history of bladder cancer    Family history of breast cancer    History of diabetes mellitus    Resolved in Sept 2023 with weight loss.   Hypertension    Ovarian cancer (HCC) 11/14/2021   Peritoneal carcinomatosis (HCC)    Pneumonia    Sleep apnea    doesn't use CPAP   Tachycardia     Surgical History: Past Surgical History:  Procedure Laterality Date   KNEE SURGERY Right    Arthroscopic   LAPAROSCOPIC ABDOMINAL EXPLORATION N/A 11/13/2021   Procedure: Exploratory laparoscopy;  Surgeon: Franky Macho, MD;  Location: [facility];  Service: General;  Laterality: N/A;   PORTACATH PLACEMENT Left 11/15/2021   Procedure: INSERTION PORT-A-CATH;  Surgeon: Franky Macho, MD;  Location: [facility];  Service: General;  Laterality: Left;   TONSILLECTOMY AND ADENOIDECTOMY      Social History: Social History   Socioeconomic History   Marital status: Married    Spouse name: Not on file   Number of children: Not on file   Years of education: Not on file   Highest education level: Not on file  Occupational History   Occupation: Unemployed  Tobacco Use   Smoking status: Never   Smokeless tobacco: Never  Vaping Use   Vaping  status: Never Used  Substance and Sexual Activity   Alcohol use: No   Drug use: No   Sexual activity: Not Currently  Other Topics Concern   Not on file  Social History Narrative   Not on file   Social Drivers of Health   Financial Resource Strain: Not on file  Food Insecurity: No Food Insecurity (03/06/2022)   Hunger Vital Sign    Worried About Running Out of Food in the Last Year: Never true    Ran Out of Food in the Last Year: Never true  Transportation Needs: No Transportation Needs (03/06/2022)   PRAPARE - Administrator, Civil Service (Medical): No    Lack of Transportation (Non-Medical): No  Physical Activity: Not on file  Stress: Not on file  Social Connections: Not on file  Intimate Partner Violence: Not At Risk (03/06/2022)   Humiliation, Afraid, Rape, and Kick questionnaire    Fear of Current or Ex-Partner: No    Emotionally Abused: No    Physically Abused: No    Sexually Abused: No    Family History: Family History  Problem Relation Age of Onset   Hypertension Mother    Heart attack Paternal Uncle    Bladder Cancer Maternal Grandmother 65       non-smoker   Dementia Maternal Grandfather    Breast cancer Maternal Great-grandmother  MGF's mother   Colon cancer Neg Hx    Ovarian cancer Neg Hx    Endometrial cancer Neg Hx    Pancreatic cancer Neg Hx    Prostate cancer Neg Hx     Current Medications:  Current Outpatient Medications:    apixaban (ELIQUIS) 5 MG TABS tablet, Take 1 tablet (5 mg total) by mouth 2 (two) times daily., Disp: 180 tablet, Rfl: 3   atorvastatin (LIPITOR) 20 MG tablet, Take 1 tablet (20 mg total) by mouth at bedtime., Disp: 90 tablet, Rfl: 3   diphenoxylate-atropine (LOMOTIL) 2.5-0.025 MG tablet, Take 2 tablets by mouth after the first watery bowel movement. May then take 1 pill by mouth after each watery bowel movement. Do not exceed 8 pills in a 24 hour period., Disp: 60 tablet, Rfl: 3   DULoxetine (CYMBALTA) 30 MG  capsule, TAKE 1 CAPSULE BY MOUTH DAILY FOR 7 DAYS, THEN INCREASE TO 2 CAPSULES DAILY, Disp: 60 capsule, Rfl: 5   LYNPARZA 150 MG tablet, TAKE 2 TABLETS TWICE A DAY. MAY TAKE WITH FOOD TO DECEASE NAUSEA AND VOMITING. (SWALLOW WHOLE), Disp: 360 tablet, Rfl: 0   magnesium oxide (MAG-OX) 400 (240 Mg) MG tablet, TAKE 1 TABLET BY MOUTH TWICE A DAY, Disp: 180 tablet, Rfl: 1   Multiple Vitamin (MULTIVITAMIN) capsule, Take 1 capsule by mouth daily., Disp: , Rfl:    Potassium 99 MG TABS, Take 99 mg by mouth at bedtime., Disp: , Rfl:    lidocaine-prilocaine (EMLA) cream, Apply a quarter sized amount to port a cath site and cover with plastic wrap 1 hour prior to infusion appointments (Patient not taking: Reported on 06/03/2023), Disp: 30 g, Rfl: 3 No current facility-administered medications for this visit.  Facility-Administered Medications Ordered in Other Visits:    alteplase (CATHFLO ACTIVASE) injection 2 mg, 2 mg, Intracatheter, Once PRN, [name], MD   heparin lock flush 100 unit/mL, 500 Units, Intravenous, Once, [name], MD   sodium chloride flush (NS) 0.9 % injection 10 mL, 10 mL, Intravenous, PRN, [name], MD   sodium chloride flush (NS) 0.9 % injection 10 mL, 10 mL, Intravenous, Once, [name], MD   Allergies: Allergies  Allergen Reactions   Augmentin [Amoxicillin-Pot Clavulanate] Diarrhea and Nausea And Vomiting   Doxycycline     Abdominal cramps.   Topamax [Topiramate] Nausea And Vomiting    REVIEW OF SYSTEMS:   Review of Systems  Constitutional:  Negative for chills, fatigue and fever.  HENT:   Negative for lump/mass, mouth sores, nosebleeds, sore throat and trouble swallowing.   Eyes:  Negative for eye problems.  Respiratory:  Negative for shortness of breath.   Cardiovascular:  Negative for chest pain, leg swelling and palpitations.  Gastrointestinal:  Negative for abdominal pain, constipation, nausea and vomiting.  Genitourinary:   Negative for bladder incontinence, difficulty urinating, dysuria, frequency, hematuria and nocturia.   Musculoskeletal:  Negative for arthralgias, back pain, flank pain, myalgias and neck pain.  Skin:  Negative for itching and rash.  Neurological:  Positive for headaches and numbness. Negative for dizziness.  Hematological:  Does not bruise/bleed easily.  Psychiatric/Behavioral:  Positive for sleep disturbance. Negative for depression and suicidal ideas. The patient is not nervous/anxious.   All other systems reviewed and are negative.    VITALS:   Weight 205 lb 9.6 oz (93.3 kg).  Wt Readings from Last 3 Encounters:  06/03/23 205 lb 9.6 oz (93.3 kg)  05/31/23 205 lb 6.4 oz (93.2 kg)  05/13/23 207 lb 6.4  oz (94.1 kg)    Body mass index is 37.6 kg/m.  Performance status (ECOG): 1 - Symptomatic but completely ambulatory  PHYSICAL EXAM:   Physical Exam Vitals and nursing note reviewed. Exam conducted with a chaperone present.  Constitutional:      Appearance: Normal appearance.  Cardiovascular:     Rate and Rhythm: Normal rate and regular rhythm.     Pulses: Normal pulses.     Heart sounds: Normal heart sounds.  Pulmonary:     Effort: Pulmonary effort is normal.     Breath sounds: Normal breath sounds.  Abdominal:     Palpations: Abdomen is soft. There is no hepatomegaly, splenomegaly or mass.     Tenderness: There is no abdominal tenderness.  Musculoskeletal:     Right lower leg: No edema.     Left lower leg: No edema.  Lymphadenopathy:     Cervical: No cervical adenopathy.     Right cervical: No superficial, deep or posterior cervical adenopathy.    Left cervical: No superficial, deep or posterior cervical adenopathy.     Upper Body:     Right upper body: No supraclavicular or axillary adenopathy.     Left upper body: No supraclavicular or axillary adenopathy.  Neurological:     General: No focal deficit present.     Mental Status: She is alert and oriented to person,  place, and time.  Psychiatric:        Mood and Affect: Mood normal.        Behavior: Behavior normal.    LABS:      Latest Ref Rng & Units 06/03/2023   12:04 PM 05/13/2023    8:46 AM 04/22/2023   11:50 AM  CBC  WBC 4.0 - 10.5 K/uL 5.7  5.2  5.3   Hemoglobin 12.0 - 15.0 g/dL 27.2  53.6  64.4   Hematocrit 36.0 - 46.0 % 34.3  31.5  32.2   Platelets 150 - 400 K/uL 134  152  153       Latest Ref Rng & Units 06/03/2023   12:04 PM 05/13/2023    8:46 AM 04/22/2023   11:50 AM  CMP  Glucose 70 - 99 mg/dL 034  742  595   BUN 6 - 20 mg/dL 15  18  15    Creatinine 0.44 - 1.00 mg/dL 6.38  7.56  4.33   Sodium 135 - 145 mmol/L 136  137  138   Potassium 3.5 - 5.1 mmol/L 3.8  3.5  4.0   Chloride 98 - 111 mmol/L 103  103  105   CO2 22 - 32 mmol/L 24  25  24    Calcium 8.9 - 10.3 mg/dL 8.9  8.6  8.6   Total Protein 6.5 - 8.1 g/dL 6.3  6.0  6.3   Total Bilirubin 0.0 - 1.2 mg/dL 1.2  0.8  0.9   Alkaline Phos 38 - 126 U/L 51  51  53   AST 15 - 41 U/L 18  18  20    ALT 0 - 44 U/L 14  12  15       Lab Results  Component Value Date   CEA1 1.1 11/13/2021   /  CEA  Date Value Ref Range Status  11/13/2021 1.1 0.0 - 4.7 ng/mL Final    Comment:    (NOTE)  Nonsmokers          <3.9                             Smokers             <5.6 [employer] Electrochemiluminescence Immunoassay (ECLIA) Values obtained with different assay methods or kits cannot be used interchangeably.  Results cannot be interpreted as absolute evidence of the presence or absence of malignant disease. Performed At: [facility] [address] [place], [name] [id number] Jolene Schimke MD WU:[id number]    No results found for: "PSA1" Lab Results  Component Value Date   CAN199 6 11/13/2021   Lab Results  Component Value Date   CAN125 18.4 04/01/2023    No results found for: "TOTALPROTELP", "ALBUMINELP", "A1GS", "A2GS", "BETS", "BETA2SER", "GAMS", "MSPIKE", "SPEI" Lab  Results  Component Value Date   FERRITIN 356 (H) 02/07/2019   FERRITIN 311 (H) 02/06/2019   FERRITIN 343 (H) 02/05/2019   Lab Results  Component Value Date   LDH 259 (H) 02/02/2019     STUDIES:   CT ABDOMEN PELVIS W CONTRAST Result Date: 05/26/2023 CLINICAL DATA:  History of ovarian cancer, monitor. * Tracking Code: BO * EXAM: CT ABDOMEN AND PELVIS WITH CONTRAST TECHNIQUE: Multidetector CT imaging of the abdomen and pelvis was performed using the standard protocol following bolus administration of intravenous contrast. RADIATION DOSE REDUCTION: This exam was performed according to the departmental dose-optimization program which includes automated exposure control, adjustment of the mA and/or kV according to patient size and/or use of iterative reconstruction technique. CONTRAST:  OMNIPAQUE IOHEXOL 300 MG/ML  SOLN COMPARISON:  Multiple priors including most recent CT February 07, 2023 FINDINGS: Lower chest: No acute abnormality. Hepatobiliary: No suspicious hepatic lesion. Cholelithiasis without findings of acute cholecystitis. No biliary ductal dilation. Pancreas: No pancreatic ductal dilation or evidence of acute inflammation. Spleen: No splenomegaly. 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 Adrenals/Urinary Tract: Bilateral adrenal glands appear normal. No hydronephrosis. Left renal cyst is considered benign and requiring no independent imaging follow-up. Urinary bladder is unremarkable for degree of distension. Stomach/Bowel: Radiopaque enteric contrast material traverses the ascending colon. Stomach is unremarkable for degree of distension. No pathologic dilation of small or large bowel. No evidence of acute bowel inflammation. Similar tethered appearance of the terminal ileum and sigmoid colon to the uterus. Vascular/Lymphatic: Stent in the left common iliac vein. Aortic atherosclerosis. Retroaortic left renal vein. Smooth IVC contours. The portal, splenic and superior  mesenteric veins are patent. No pathologically enlarged abdominal or pelvic lymph nodes. Reproductive: Similar slightly heterogeneous appearance of the uterus. Other: Trace pelvic free fluid is unchanged. No discrete peritoneal or omental nodularity. Musculoskeletal: No aggressive lytic or blastic lesion of bone. Similar probable postradiation change involving the sacrum and right iliac bones. Multilevel degenerative changes spine. IMPRESSION: 1. Similar slightly heterogeneous appearance of the uterus with tethered appearance of the terminal ileum and sigmoid colon to the no evidence of new or progressive disease in the abdomen or pelvis. 2. Cholelithiasis without findings of acute cholecystitis. 3.  Aortic Atherosclerosis (ICD10-I70.0). Electronically Signed   By: Maudry Mayhew M.D.   On: 05/26/2023 08:35   DG CV Line Injection Result Date: 05/13/2023 INDICATION: History of metastatic ovarian cancer, post surgical placement of a port a catheter on 11/15/2021, now with difficulty aspirating. EXAM: FLUOROSCOPIC GUIDED PORT A CATHETER CHECK COMPARISON:  Chest radiograph-02/16/2022 MEDICATIONS: None. CONTRAST:  None FLUOROSCOPY TIME:  18 seconds (2.5  mGy) COMPLICATIONS: None immediate. TECHNIQUE: The procedure, risks, benefits, and alternatives were explained to the patient and informed written consent was obtained. A timeout was performed prior to the initiation of the procedure. The patient's chest port a catheter was accessed by the IV team. The patient was placed supine on the fluoroscopy table. A preprocedural spot fluoroscopic image was obtained of the chest in existing port a catheter. Port a catheter was noted to easily aspirate and flush. Contrast was injected via the Port a catheter and images were reviewed. The Port a catheter was flushed with a heparin dwell and de accessed. A dressing was placed. The patient tolerated the procedure well without immediate postprocedural complication. FINDINGS: Unchanged  positioning of anterior chest wall subclavian vein approach port a catheter with tip projected over the expected location of the central aspect of the left innominate vein. Port a catheter was noted to easily aspirate with the patient in a supine position. Contrast injection demonstrates patency of the port a catheter with passage of contrast into the central aspect of the left innominate vein. There is no evidence of catheter kink or fracture. No contrast extravasation. IMPRESSION: Intermittent difficulty aspirating the port a catheter is likely secondary to port a catheter tip via and located within the left innominate vein, likely with intermittent abutment of the port tip against the vein wall. The port a catheter is ready for immediate usage though given port tip positioning, could serve as a unreliable source for blood draws. Consideration for definitive Port revision may be performed as indicated. Electronically Signed   By: Simonne Come M.D.   On: 05/13/2023 17:08

## 2023-06-03 NOTE — Progress Notes (Signed)
Patient is taking [place] as prescribed. She has not missed any doses and reports no side effects at this time.    Patient has been examined by Dr. Ellin Saba. Vital signs and labs have been reviewed by MD - ANC, Creatinine, LFTs, hemoglobin, and platelets are within treatment parameters per M.D. - pt may proceed with treatment.  Primary RN and pharmacy notified.

## 2023-06-03 NOTE — Progress Notes (Signed)
Patient tolerated chemotherapy with no complaints voiced.  Side effects with management reviewed with understanding verbalized.  Port site clean and dry with no bruising or swelling noted at site.  No blood return noted from port. MD is aware and per MD to continue with treatment through port.   No pain, swelling, redness, and discomfort noted from port while flushing. Port deaccessed after chemotherapy treatment per orders. Patient left in satisfactory condition with VSS and no s/s of distress noted. All follow ups as scheduled.  Lanny Donoso [employer]

## 2023-06-03 NOTE — Patient Instructions (Signed)
[facility] [place] [facility]  Discharge Instructions: Thank you for choosing [facility] to provide your oncology and hematology care.  If you have a lab appointment with the Cancer Center - please note that after April 8th, 2024, all labs will be drawn in the cancer center.  You do not have to check in or register with the main entrance as you have in the past but will complete your check-in in the cancer center.  Wear comfortable clothing and clothing appropriate for easy access to any Portacath or PICC line.   We strive to give you quality time with your provider. You may need to reschedule your appointment if you arrive late (15 or more minutes).  Arriving late affects you and other patients whose appointments are after yours.  Also, if you miss three or more appointments without notifying the office, you may be dismissed from the clinic at the provider's discretion.      For prescription refill requests, have your pharmacy contact our office and allow 72 hours for refills to be completed.    Today you received the following chemotherapy and/or immunotherapy agents avastin   To help prevent nausea and vomiting after your treatment, we encourage you to take your nausea medication as directed.  BELOW ARE SYMPTOMS THAT SHOULD BE REPORTED IMMEDIATELY: *FEVER GREATER THAN 100.4 F (38 C) OR HIGHER *CHILLS OR SWEATING *NAUSEA AND VOMITING THAT IS NOT CONTROLLED WITH YOUR NAUSEA MEDICATION *UNUSUAL SHORTNESS OF BREATH *UNUSUAL BRUISING OR BLEEDING *URINARY PROBLEMS (pain or burning when urinating, or frequent urination) *BOWEL PROBLEMS (unusual diarrhea, constipation, pain near the anus) TENDERNESS IN MOUTH AND THROAT WITH OR WITHOUT PRESENCE OF ULCERS (sore throat, sores in mouth, or a toothache) UNUSUAL RASH, SWELLING OR PAIN  UNUSUAL VAGINAL DISCHARGE OR ITCHING   Items with * indicate a potential emergency and should be followed up as  soon as possible or go to the Emergency Department if any problems should occur.  Please show the CHEMOTHERAPY ALERT CARD or IMMUNOTHERAPY ALERT CARD at check-in to the Emergency Department and triage nurse.  Should you have questions after your visit or need to cancel or reschedule your appointment, please contact [facility] [place] - A DEPT OF [facility] [phone]  and follow the prompts.  Office hours are 8:00 a.m. to 4:30 p.m. Monday - Friday. Please note that voicemails left after 4:00 p.m. may not be returned until the following business day.  We are closed weekends and major holidays. You have access to a nurse at all times for urgent questions. Please call the main number to the clinic [phone] and follow the prompts.  For any non-urgent questions, you may also contact your provider using MyChart. We now offer e-Visits for anyone 63 and older to request care online for non-urgent symptoms. For details visit [email or web address].   Also download the MyChart app! Go to the app store, search "MyChart", open the app, select [facility], and log in with your MyChart username and password.

## 2023-06-03 NOTE — Progress Notes (Signed)
Patient's port did not give blood for lab work today. Pt received alteplase on 05/13/23 and no blood return noted. Pt went for a dye study on 05/13/23. Pt.'s port flushes with ease, no discomfort noted while flushing, no pain, no swelling, and no redness while flushing. MD made aware. Per MD okay to continue with treatment through port and no TPA needed at this time. Pt updated and agrees to plan. Labs drawn peripherally.   Daemian Gahm [employer]

## 2023-06-24 ENCOUNTER — Inpatient Hospital Stay: Payer: Commercial Managed Care - PPO | Attending: Gynecologic Oncology

## 2023-06-24 ENCOUNTER — Inpatient Hospital Stay: Payer: Commercial Managed Care - PPO

## 2023-06-24 ENCOUNTER — Inpatient Hospital Stay: Payer: Commercial Managed Care - PPO | Admitting: Hematology

## 2023-06-24 VITALS — BP 140/78 | HR 87 | Temp 96.8°F | Resp 16 | Wt 209.5 lb

## 2023-06-24 DIAGNOSIS — C482 Malignant neoplasm of peritoneum, unspecified: Secondary | ICD-10-CM | POA: Diagnosis present

## 2023-06-24 DIAGNOSIS — Z79899 Other long term (current) drug therapy: Secondary | ICD-10-CM | POA: Insufficient documentation

## 2023-06-24 DIAGNOSIS — C569 Malignant neoplasm of unspecified ovary: Secondary | ICD-10-CM

## 2023-06-24 DIAGNOSIS — Z5112 Encounter for antineoplastic immunotherapy: Secondary | ICD-10-CM | POA: Diagnosis present

## 2023-06-24 LAB — COMPREHENSIVE METABOLIC PANEL
ALT: 15 U/L (ref 0–44)
AST: 21 U/L (ref 15–41)
Albumin: 3.5 g/dL (ref 3.5–5.0)
Alkaline Phosphatase: 54 U/L (ref 38–126)
Anion gap: 9 (ref 5–15)
BUN: 17 mg/dL (ref 6–20)
CO2: 25 mmol/L (ref 22–32)
Calcium: 8.9 mg/dL (ref 8.9–10.3)
Chloride: 104 mmol/L (ref 98–111)
Creatinine, Ser: 0.86 mg/dL (ref 0.44–1.00)
GFR, Estimated: >60 mL/min (ref >60)
Glucose, Bld: 115 mg/dL — ABNORMAL HIGH (ref 70–99)
Potassium: 3.8 mmol/L (ref 3.5–5.1)
Sodium: 138 mmol/L (ref 135–145)
Total Bilirubin: 1.2 mg/dL (ref 0.0–1.2)
Total Protein: 6.4 g/dL — ABNORMAL LOW (ref 6.5–8.1)

## 2023-06-24 LAB — CBC WITH DIFFERENTIAL/PLATELET
Abs Immature Granulocytes: 0.05 10*3/uL (ref 0.00–0.07)
Basophils Absolute: 0 10*3/uL (ref 0.0–0.1)
Basophils Relative: 1 %
Eosinophils Absolute: 0.1 10*3/uL (ref 0.0–0.5)
Eosinophils Relative: 3 %
HCT: 32.4 % — ABNORMAL LOW (ref 36.0–46.0)
Hemoglobin: 11.1 g/dL — ABNORMAL LOW (ref 12.0–15.0)
Immature Granulocytes: 1 %
Lymphocytes Relative: 24 %
Lymphs Abs: 1.2 10*3/uL (ref 0.7–4.0)
MCH: 39.5 pg — ABNORMAL HIGH (ref 26.0–34.0)
MCHC: 34.3 g/dL (ref 30.0–36.0)
MCV: 115.3 fL — ABNORMAL HIGH (ref 80.0–100.0)
Monocytes Absolute: 0.4 10*3/uL (ref 0.1–1.0)
Monocytes Relative: 9 %
Neutro Abs: 3.1 10*3/uL (ref 1.7–7.7)
Neutrophils Relative %: 62 %
Platelets: 145 10*3/uL — ABNORMAL LOW (ref 150–400)
RBC: 2.81 MIL/uL — ABNORMAL LOW (ref 3.87–5.11)
RDW: 16.8 % — ABNORMAL HIGH (ref 11.5–15.5)
WBC: 4.8 10*3/uL (ref 4.0–10.5)
nRBC: 0.8 % — ABNORMAL HIGH (ref 0.0–0.2)

## 2023-06-24 LAB — URINALYSIS, DIPSTICK ONLY
Bilirubin Urine: NEGATIVE
Glucose, UA: NEGATIVE mg/dL
Hgb urine dipstick: NEGATIVE
Ketones, ur: NEGATIVE mg/dL
Leukocytes,Ua: NEGATIVE
Nitrite: NEGATIVE
Protein, ur: NEGATIVE mg/dL
Specific Gravity, Urine: 1.021 (ref 1.005–1.030)
pH: 5 (ref 5.0–8.0)

## 2023-06-24 LAB — MAGNESIUM: Magnesium: 1.8 mg/dL (ref 1.7–2.4)

## 2023-06-24 MED ORDER — SODIUM CHLORIDE 0.9 % IV SOLN
15.0000 mg/kg | Freq: Once | INTRAVENOUS | Status: AC
  Administered 2023-06-24: 1400 mg via INTRAVENOUS
  Filled 2023-06-24: qty 48

## 2023-06-24 MED ORDER — SODIUM CHLORIDE 0.9 % IV SOLN: Freq: Once | INTRAVENOUS | Status: AC

## 2023-06-24 MED ORDER — SODIUM CHLORIDE 0.9% FLUSH
10.0000 mL | INTRAVENOUS | Status: DC | PRN
  Administered 2023-06-24: 10 mL

## 2023-06-24 MED ORDER — HEPARIN SOD (PORK) LOCK FLUSH 100 UNIT/ML IV SOLN
500.0000 [IU] | Freq: Once | INTRAVENOUS | Status: AC | PRN
Start: 2023-06-24 — End: 2023-06-24
  Administered 2023-06-24: 500 [IU]

## 2023-06-24 MED ORDER — SODIUM CHLORIDE 0.9% FLUSH
10.0000 mL | Freq: Once | INTRAVENOUS | Status: AC
  Administered 2023-06-24: 10 mL via INTRAVENOUS

## 2023-06-24 NOTE — Progress Notes (Signed)
Patients port is not giving blood.  Patient had a recent dye study that recent said unlikely to give blood and the line is unchanged positioning of anterior chest wall subclavian vein approach port a catheter with tip projected over the expected location of the central aspect of the left innominate vein. MD notified and message received okay to use Port for treatment.

## 2023-06-24 NOTE — Progress Notes (Signed)
Patient presents today for MVASI infusion. Vital signs within parameters for treatment. UA negative for protein. Patient denies any side effects related to her last treatment.   Treatment given today per MD orders. Tolerated infusion without adverse affects. Vital signs stable. No complaints at this time. Discharged from clinic ambulatory in stable condition. Alert and oriented x 3. F/U with [facility] as scheduled.

## 2023-06-24 NOTE — Progress Notes (Signed)
Port doesn't give blood.  Labs drawn peripherally.

## 2023-06-24 NOTE — Patient Instructions (Signed)
[facility] [place] - [facility]  Discharge Instructions: Thank you for choosing [facility] to provide your oncology and hematology care.  If you have a lab appointment with the Cancer Center - please note that after April 8th, 2024, all labs will be drawn in the cancer center.  You do not have to check in or register with the main entrance as you have in the past but will complete your check-in in the cancer center.  Wear comfortable clothing and clothing appropriate for easy access to any Portacath or PICC line.   We strive to give you quality time with your provider. You may need to reschedule your appointment if you arrive late (15 or more minutes).  Arriving late affects you and other patients whose appointments are after yours.  Also, if you miss three or more appointments without notifying the office, you may be dismissed from the clinic at the provider's discretion.      For prescription refill requests, have your pharmacy contact our office and allow 72 hours for refills to be completed.    Today you received the following chemotherapy and/or immunotherapy agents MvASI. Bevacizumab Injection What is this medication? BEVACIZUMAB (be va SIZ yoo mab) treats some types of cancer. It works by blocking a protein that causes cancer cells to grow and multiply. This helps to slow or stop the spread of cancer cells. It is a monoclonal antibody. This medicine may be used for other purposes; ask your health care provider or pharmacist if you have questions. COMMON BRAND NAME(S): Alymsys, Avastin, MVASI, Rosaland Lao What should I tell my care team before I take this medication? They need to know if you have any of these conditions: Blood clots Coughing up blood Having or recent surgery Heart failure High blood pressure History of a connection between 2 or more body parts that do not usually connect (fistula) History of a tear in your  stomach or intestines Protein in your urine An unusual or allergic reaction to bevacizumab, other medications, foods, dyes, or preservatives Pregnant or trying to get pregnant Breast-feeding How should I use this medication? This medication is injected into a vein. It is given by your care team in a hospital or clinic setting. Talk to your care team the use of this medication in children. Special care may be needed. Overdosage: If you think you have taken too much of this medicine contact a poison control center or emergency room at once. NOTE: This medicine is only for you. Do not share this medicine with others. What if I miss a dose? Keep appointments for follow-up doses. It is important not to miss your dose. Call your care team if you are unable to keep an appointment. What may interact with this medication? Interactions are not expected. This list may not describe all possible interactions. Give your health care provider a list of all the medicines, herbs, non-prescription drugs, or dietary supplements you use. Also tell them if you smoke, drink alcohol, or use illegal drugs. Some items may interact with your medicine. What should I watch for while using this medication? Your condition will be monitored carefully while you are receiving this medication. You may need blood work while taking this medication. This medication may make you feel generally unwell. This is not uncommon as chemotherapy can affect healthy cells as well as cancer cells. Report any side effects. Continue your course of treatment even though you feel ill  unless your care team tells you to stop. This medication may increase your risk to bruise or bleed. Call your care team if you notice any unusual bleeding. Before having surgery, talk to your care team to make sure it is ok. This medication can increase the risk of poor healing of your surgical site or wound. You will need to stop this medication for 28 days before  surgery. After surgery, wait at least 28 days before restarting this medication. Make sure the surgical site or wound is healed enough before restarting this medication. Talk to your care team if questions. Talk to your care team if you may be pregnant. Serious birth defects can occur if you take this medication during pregnancy and for 6 months after the last dose. Contraception is recommended while taking this medication and for 6 months after the last dose. Your care team can help you find the option that works for you. Do not breastfeed while taking this medication and for 6 months after the last dose. This medication can cause infertility. Talk to your care team if you are concerned about your fertility. What side effects may I notice from receiving this medication? Side effects that you should report to your care team as soon as possible: Allergic reactions--skin rash, itching, hives, swelling of the face, lips, tongue, or throat Bleeding--bloody or black, tar-like stools, vomiting blood or brown material that looks like coffee grounds, red or dark brown urine, small red or purple spots on skin, unusual bruising or bleeding Blood clot--pain, swelling, or warmth in the leg, shortness of breath, chest pain Heart attack--pain or tightness in the chest, shoulders, arms, or jaw, nausea, shortness of breath, cold or clammy skin, feeling faint or lightheaded Heart failure--shortness of breath, swelling of the ankles, feet, or hands, sudden weight gain, unusual weakness or fatigue Increase in blood pressure Infection--fever, chills, cough, sore throat, wounds that don't heal, pain or trouble when passing urine, general feeling of discomfort or being unwell Infusion reactions--chest pain, shortness of breath or trouble breathing, feeling faint or lightheaded Kidney injury--decrease in the amount of urine, swelling of the ankles, hands, or feet Stomach pain that is severe, does not go away, or gets  worse Stroke--sudden numbness or weakness of the face, arm, or leg, trouble speaking, confusion, trouble walking, loss of balance or coordination, dizziness, severe headache, change in vision Sudden and severe headache, confusion, change in vision, seizures, which may be signs of posterior reversible encephalopathy syndrome (PRES) Side effects that usually do not require medical attention (report to your care team if they continue or are bothersome): Back pain Change in taste Diarrhea Dry skin Increased tears Nosebleed This list may not describe all possible side effects. Call your doctor for medical advice about side effects. You may report side effects to FDA at 1-800-FDA-1088. Where should I keep my medication? This medication is given in a hospital or clinic. It will not be stored at home. NOTE: This sheet is a summary. It may not cover all possible information. If you have questions about this medicine, talk to your doctor, pharmacist, or health care provider.  2024 Elsevier/Gold Standard (2021-10-06 00:00:00)      To help prevent nausea and vomiting after your treatment, we encourage you to take your nausea medication as directed.  BELOW ARE SYMPTOMS THAT SHOULD BE REPORTED IMMEDIATELY: *FEVER GREATER THAN 100.4 F (38 C) OR HIGHER *CHILLS OR SWEATING *NAUSEA AND VOMITING THAT IS NOT CONTROLLED WITH YOUR NAUSEA MEDICATION *UNUSUAL SHORTNESS OF BREATH *  UNUSUAL BRUISING OR BLEEDING *URINARY PROBLEMS (pain or burning when urinating, or frequent urination) *BOWEL PROBLEMS (unusual diarrhea, constipation, pain near the anus) TENDERNESS IN MOUTH AND THROAT WITH OR WITHOUT PRESENCE OF ULCERS (sore throat, sores in mouth, or a toothache) UNUSUAL RASH, SWELLING OR PAIN  UNUSUAL VAGINAL DISCHARGE OR ITCHING   Items with * indicate a potential emergency and should be followed up as soon as possible or go to the Emergency Department if any problems should occur.  Please show the  CHEMOTHERAPY ALERT CARD or IMMUNOTHERAPY ALERT CARD at check-in to the Emergency Department and triage nurse.  Should you have questions after your visit or need to cancel or reschedule your appointment, please contact [facility] [place] - A DEPT OF [facility] [phone]  and follow the prompts.  Office hours are 8:00 a.m. to 4:30 p.m. Monday - Friday. Please note that voicemails left after 4:00 p.m. may not be returned until the following business day.  We are closed weekends and major holidays. You have access to a nurse at all times for urgent questions. Please call the main number to the clinic [phone] and follow the prompts.  For any non-urgent questions, you may also contact your provider using MyChart. We now offer e-Visits for anyone 44 and older to request care online for non-urgent symptoms. For details visit [email or web address].   Also download the MyChart app! Go to the app store, search "MyChart", open the app, select [place], and log in with your MyChart username and password.

## 2023-07-03 ENCOUNTER — Encounter: Payer: Self-pay | Admitting: Family Medicine

## 2023-07-03 ENCOUNTER — Ambulatory Visit: Payer: Commercial Managed Care - PPO | Admitting: Family Medicine

## 2023-07-03 VITALS — BP 133/79 | HR 100 | Temp 98.5°F | Ht 62.0 in | Wt 208.0 lb

## 2023-07-03 DIAGNOSIS — K439 Ventral hernia without obstruction or gangrene: Secondary | ICD-10-CM

## 2023-07-03 DIAGNOSIS — E1169 Type 2 diabetes mellitus with other specified complication: Secondary | ICD-10-CM | POA: Diagnosis not present

## 2023-07-03 DIAGNOSIS — E785 Hyperlipidemia, unspecified: Secondary | ICD-10-CM

## 2023-07-03 DIAGNOSIS — Z86711 Personal history of pulmonary embolism: Secondary | ICD-10-CM

## 2023-07-03 DIAGNOSIS — C482 Malignant neoplasm of peritoneum, unspecified: Secondary | ICD-10-CM | POA: Diagnosis not present

## 2023-07-03 LAB — BAYER DCA HB A1C WAIVED: HB A1C (BAYER DCA - WAIVED): 5.7 % — ABNORMAL HIGH (ref 4.8–5.6)

## 2023-07-03 MED ORDER — APIXABAN 5 MG PO TABS: 5.0000 mg | ORAL_TABLET | Freq: Two times a day (BID) | ORAL | 3 refills | Status: DC

## 2023-07-03 MED ORDER — ATORVASTATIN CALCIUM 20 MG PO TABS: 20.0000 mg | ORAL_TABLET | Freq: Every day | ORAL | 3 refills | Status: DC

## 2023-07-03 NOTE — Progress Notes (Signed)
Subjective: CC:DM PCP: [name], DO HPI:Nicole Santos is a 53 y.o. female presenting to clinic today for:  1. Type 2 Diabetes with hypertension, hyperlipidemia:  She reports compliance with Lipitor, Eliquis.  Her diabetes and blood pressure has been diet controlled. Diabetes Health Maintenance Due  Topic Date Due   OPHTHALMOLOGY EXAM  Never done   FOOT EXAM  09/18/2023   HEMOGLOBIN A1C  09/27/2023    Last A1c:  Lab Results  Component Value Date   HGBA1C 5.6 03/29/2023    ROS: No chest pain, shortness of breath or edema reported  2.  Hernia Patient reports that she has a hernia that was seen on CT scan.  She discussed this with her specialist recently but they did not feel that it needed intervention at this time.  She does report that she gets some pain in the area just above the hernia when she coughs, sneezes or laughs.  She typically has to hold her belly.  ROS: Per HPI  Allergies  Allergen Reactions   Augmentin [Amoxicillin-Pot Clavulanate] Diarrhea and Nausea And Vomiting   Doxycycline     Abdominal cramps.   Topamax [Topiramate] Nausea And Vomiting   Past Medical History:  Diagnosis Date   Acute pulmonary embolism (HCC) 03/06/2022   Acute respiratory disease due to COVID-19 virus 02/02/2019   Anxiety    Cancer (HCC)    Depression    Dyspnea    Dysrhythmia    Family history of bladder cancer    Family history of breast cancer    History of diabetes mellitus    Resolved in Sept 2023 with weight loss.   Hypertension    Ovarian cancer (HCC) 11/14/2021   Peritoneal carcinomatosis (HCC)    Pneumonia    Sleep apnea    doesn't use CPAP   Tachycardia     Current Outpatient Medications:    apixaban (ELIQUIS) 5 MG TABS tablet, Take 1 tablet (5 mg total) by mouth 2 (two) times daily., Disp: 180 tablet, Rfl: 3   atorvastatin (LIPITOR) 20 MG tablet, Take 1 tablet (20 mg total) by mouth at bedtime., Disp: 90 tablet, Rfl: 3   diphenoxylate-atropine  (LOMOTIL) 2.5-0.025 MG tablet, Take 2 tablets by mouth after the first watery bowel movement. May then take 1 pill by mouth after each watery bowel movement. Do not exceed 8 pills in a 24 hour period., Disp: 60 tablet, Rfl: 3   DULoxetine (CYMBALTA) 30 MG capsule, TAKE 1 CAPSULE BY MOUTH DAILY FOR 7 DAYS, THEN INCREASE TO 2 CAPSULES DAILY, Disp: 180 capsule, Rfl: 2   lidocaine-prilocaine (EMLA) cream, Apply a quarter sized amount to port a cath site and cover with plastic wrap 1 hour prior to infusion appointments, Disp: 30 g, Rfl: 3   LYNPARZA 150 MG tablet, TAKE 2 TABLETS TWICE A DAY. MAY TAKE WITH FOOD TO DECEASE NAUSEA AND VOMITING. (SWALLOW WHOLE), Disp: 360 tablet, Rfl: 0   magnesium oxide (MAG-OX) 400 (240 Mg) MG tablet, TAKE 1 TABLET BY MOUTH TWICE A DAY, Disp: 180 tablet, Rfl: 1   Multiple Vitamin (MULTIVITAMIN) capsule, Take 1 capsule by mouth daily., Disp: , Rfl:    Potassium 99 MG TABS, Take 99 mg by mouth at bedtime., Disp: , Rfl:  No current facility-administered medications for this visit.  Facility-Administered Medications Ordered in Other Visits:    heparin lock flush 100 unit/mL, 500 Units, Intravenous, Once, [name], MD   sodium chloride flush (NS) 0.9 % injection 10 mL, 10  mL, Intravenous, PRN, [name], MD Social History   Socioeconomic History   Marital status: Married    Spouse name: Not on file   Number of children: Not on file   Years of education: Not on file   Highest education level: Not on file  Occupational History   Occupation: Unemployed  Tobacco Use   Smoking status: Never   Smokeless tobacco: Never  Vaping Use   Vaping status: Never Used  Substance and Sexual Activity   Alcohol use: No   Drug use: No   Sexual activity: Not Currently  Other Topics Concern   Not on file  Social History Narrative   Not on file   Social Drivers of Health   Financial Resource Strain: Not on file  Food Insecurity: No Food Insecurity  (03/06/2022)   Hunger Vital Sign    Worried About Running Out of Food in the Last Year: Never true    Ran Out of Food in the Last Year: Never true  Transportation Needs: No Transportation Needs (03/06/2022)   PRAPARE - Administrator, Civil Service (Medical): No    Lack of Transportation (Non-Medical): No  Physical Activity: Not on file  Stress: Not on file  Social Connections: Not on file  Intimate Partner Violence: Not At Risk (03/06/2022)   Humiliation, Afraid, Rape, and Kick questionnaire    Fear of Current or Ex-Partner: No    Emotionally Abused: No    Physically Abused: No    Sexually Abused: No   Family History  Problem Relation Age of Onset   Hypertension Mother    Heart attack Paternal Uncle    Bladder Cancer Maternal Grandmother 72       non-smoker   Dementia Maternal Grandfather    Breast cancer Maternal Great-grandmother        MGF's mother   Colon cancer Neg Hx    Ovarian cancer Neg Hx    Endometrial cancer Neg Hx    Pancreatic cancer Neg Hx    Prostate cancer Neg Hx     Objective: Office vital signs reviewed. BP 133/79   Pulse 100   Temp 98.5 F (36.9 C)   Ht 5\' 2"  (1.575 m)   Wt 208 lb (94.3 kg)   SpO2 98%   BMI 38.04 kg/m   Physical Examination:  General: Awake, alert, nontoxic female, No acute distress HEENT: sclera white.  Moist mucous membranes Cardio: regular rate and rhythm, S1S2 heard, no murmurs appreciated Pulm: clear to auscultation bilaterally, no wheezes, rhonchi or rales; normal work of breathing on room air GI: She has a golf ball size, well-circumscribed, reducible hernia that is just lateral and right to the umbilicus.   Assessment/ Plan: 53 y.o. female   Type 2 diabetes mellitus with other specified complication, without long-term current use of insulin (HCC) - Plan: Bayer DCA Hb A1c Waived  Hyperlipidemia associated with type 2 diabetes mellitus (HCC) - Plan: atorvastatin (LIPITOR) 20 MG tablet  Primary peritoneal  carcinomatosis (HCC) - Plan: apixaban (ELIQUIS) 5 MG TABS tablet  History of pulmonary embolus (PE) - Plan: apixaban (ELIQUIS) 5 MG TABS tablet  Supraumbilical hernia  Sugar diet controlled with A1c of 5.7 today.  Lipitor and Eliquis renewed.  I am CCing chart to Dr. Lovell Sheehan for further discussion of this hernia which seems to be symptomatic.  It was approximately golf ball sized on my exam but reducible.  I cannot see where this was measured compared to the September CT but I  will see if we can get an addendum to the December CT with measurements  Yasaman Kolek Hulen Skains, DO [facility] [phone]

## 2023-07-15 ENCOUNTER — Inpatient Hospital Stay: Payer: Commercial Managed Care - PPO

## 2023-07-15 ENCOUNTER — Other Ambulatory Visit: Payer: Self-pay | Admitting: *Deleted

## 2023-07-15 ENCOUNTER — Inpatient Hospital Stay: Payer: Commercial Managed Care - PPO | Attending: Gynecologic Oncology

## 2023-07-15 VITALS — BP 134/70 | HR 92 | Temp 96.9°F | Resp 18 | Wt 207.2 lb

## 2023-07-15 DIAGNOSIS — C482 Malignant neoplasm of peritoneum, unspecified: Secondary | ICD-10-CM | POA: Insufficient documentation

## 2023-07-15 DIAGNOSIS — C569 Malignant neoplasm of unspecified ovary: Secondary | ICD-10-CM

## 2023-07-15 DIAGNOSIS — Z79899 Other long term (current) drug therapy: Secondary | ICD-10-CM | POA: Insufficient documentation

## 2023-07-15 DIAGNOSIS — Z5112 Encounter for antineoplastic immunotherapy: Secondary | ICD-10-CM | POA: Diagnosis present

## 2023-07-15 LAB — CBC WITH DIFFERENTIAL/PLATELET
Abs Immature Granulocytes: 0.03 10*3/uL (ref 0.00–0.07)
Basophils Absolute: 0 10*3/uL (ref 0.0–0.1)
Basophils Relative: 1 %
Eosinophils Absolute: 0.2 10*3/uL (ref 0.0–0.5)
Eosinophils Relative: 3 %
HCT: 34 % — ABNORMAL LOW (ref 36.0–46.0)
Hemoglobin: 11.6 g/dL — ABNORMAL LOW (ref 12.0–15.0)
Immature Granulocytes: 1 %
Lymphocytes Relative: 25 %
Lymphs Abs: 1.5 10*3/uL (ref 0.7–4.0)
MCH: 39.3 pg — ABNORMAL HIGH (ref 26.0–34.0)
MCHC: 34.1 g/dL (ref 30.0–36.0)
MCV: 115.3 fL — ABNORMAL HIGH (ref 80.0–100.0)
Monocytes Absolute: 0.5 10*3/uL (ref 0.1–1.0)
Monocytes Relative: 9 %
Neutro Abs: 3.7 10*3/uL (ref 1.7–7.7)
Neutrophils Relative %: 61 %
Platelets: 160 10*3/uL (ref 150–400)
RBC: 2.95 MIL/uL — ABNORMAL LOW (ref 3.87–5.11)
RDW: 16.7 % — ABNORMAL HIGH (ref 11.5–15.5)
WBC: 5.9 10*3/uL (ref 4.0–10.5)
nRBC: 1.2 % — ABNORMAL HIGH (ref 0.0–0.2)

## 2023-07-15 LAB — COMPREHENSIVE METABOLIC PANEL
ALT: 15 U/L (ref 0–44)
AST: 20 U/L (ref 15–41)
Albumin: 3.6 g/dL (ref 3.5–5.0)
Alkaline Phosphatase: 51 U/L (ref 38–126)
Anion gap: 9 (ref 5–15)
BUN: 14 mg/dL (ref 6–20)
CO2: 27 mmol/L (ref 22–32)
Calcium: 9.3 mg/dL (ref 8.9–10.3)
Chloride: 105 mmol/L (ref 98–111)
Creatinine, Ser: 0.77 mg/dL (ref 0.44–1.00)
GFR, Estimated: >60 mL/min (ref >60)
Glucose, Bld: 119 mg/dL — ABNORMAL HIGH (ref 70–99)
Potassium: 3.8 mmol/L (ref 3.5–5.1)
Sodium: 141 mmol/L (ref 135–145)
Total Bilirubin: 1.1 mg/dL (ref 0.0–1.2)
Total Protein: 6.7 g/dL (ref 6.5–8.1)

## 2023-07-15 LAB — MAGNESIUM: Magnesium: 2 mg/dL (ref 1.7–2.4)

## 2023-07-15 MED ORDER — DIPHENOXYLATE-ATROPINE 2.5-0.025 MG PO TABS: ORAL_TABLET | ORAL | 3 refills | Status: DC

## 2023-07-15 MED ORDER — HEPARIN SOD (PORK) LOCK FLUSH 100 UNIT/ML IV SOLN
500.0000 [IU] | Freq: Once | INTRAVENOUS | Status: AC | PRN
  Administered 2023-07-15: 500 [IU]

## 2023-07-15 MED ORDER — SODIUM CHLORIDE 0.9 % IV SOLN
15.0000 mg/kg | Freq: Once | INTRAVENOUS | Status: AC
  Administered 2023-07-15: 1400 mg via INTRAVENOUS
  Filled 2023-07-15: qty 48

## 2023-07-15 MED ORDER — COLD PACK MISC ONCOLOGY
1.0000 | Freq: Once | Status: DC | PRN
Start: 2023-07-15 — End: 2023-07-15

## 2023-07-15 MED ORDER — SODIUM CHLORIDE 0.9 % IV SOLN: Freq: Once | INTRAVENOUS | Status: AC

## 2023-07-15 MED ORDER — SODIUM CHLORIDE 0.9% FLUSH
10.0000 mL | INTRAVENOUS | Status: DC | PRN
  Administered 2023-07-15: 10 mL

## 2023-07-15 NOTE — Progress Notes (Signed)
 Labs reviewed today. Ok to treat. No new issues reported by pt today.   Treatment given per orders. Patient tolerated it well without problems. Vitals stable and discharged home from clinic ambulatory. Follow up as scheduled.;

## 2023-07-15 NOTE — Patient Instructions (Signed)
 [facility] - A DEPT OF [facility]  Discharge Instructions: Thank you for choosing [facility] to provide your oncology and hematology care.  If you have a lab appointment with the Cancer Center - please note that after April 8th, 2024, all labs will be drawn in the cancer center.  You do not have to check in or register with the main entrance as you have in the past but will complete your check-in in the cancer center.  Wear comfortable clothing and clothing appropriate for easy access to any Portacath or PICC line.   We strive to give you quality time with your provider. You may need to reschedule your appointment if you arrive late (15 or more minutes).  Arriving late affects you and other patients whose appointments are after yours.  Also, if you miss three or more appointments without notifying the office, you may be dismissed from the clinic at the provider's discretion.      For prescription refill requests, have your pharmacy contact our office and allow 72 hours for refills to be completed.    Today you received the following chemotherapy and/or immunotherapy agents Bevacizumab -awwb    To help prevent nausea and vomiting after your treatment, we encourage you to take your nausea medication as directed.  BELOW ARE SYMPTOMS THAT SHOULD BE REPORTED IMMEDIATELY: *FEVER GREATER THAN 100.4 F (38 C) OR HIGHER *CHILLS OR SWEATING *NAUSEA AND VOMITING THAT IS NOT CONTROLLED WITH YOUR NAUSEA MEDICATION *UNUSUAL SHORTNESS OF BREATH *UNUSUAL BRUISING OR BLEEDING *URINARY PROBLEMS (pain or burning when urinating, or frequent urination) *BOWEL PROBLEMS (unusual diarrhea, constipation, pain near the anus) TENDERNESS IN MOUTH AND THROAT WITH OR WITHOUT PRESENCE OF ULCERS (sore throat, sores in mouth, or a toothache) UNUSUAL RASH, SWELLING OR PAIN  UNUSUAL VAGINAL DISCHARGE OR ITCHING   Items with * indicate a potential emergency and should be  followed up as soon as possible or go to the Emergency Department if any problems should occur.  Please show the CHEMOTHERAPY ALERT CARD or IMMUNOTHERAPY ALERT CARD at check-in to the Emergency Department and triage nurse.  Should you have questions after your visit or need to cancel or reschedule your appointment, please contact [facility] [place] - A DEPT OF [facility] [phone]  and follow the prompts.  Office hours are 8:00 a.m. to 4:30 p.m. Monday - Friday. Please note that voicemails left after 4:00 p.m. may not be returned until the following business day.  We are closed weekends and major holidays. You have access to a nurse at all times for urgent questions. Please call the main number to the clinic [phone] and follow the prompts.  For any non-urgent questions, you may also contact your provider using MyChart. We now offer e-Visits for anyone 31 and older to request care online for non-urgent symptoms. For details visit [email or web address].   Also download the MyChart app! Go to the app store, search "MyChart", open the app, select [place], and log in with your MyChart username and password.

## 2023-07-16 LAB — CA 125: Cancer Antigen (CA) 125: 41.3 U/mL — ABNORMAL HIGH (ref 0.0–38.1)

## 2023-07-19 ENCOUNTER — Other Ambulatory Visit: Payer: Self-pay | Admitting: Hematology

## 2023-07-19 DIAGNOSIS — C482 Malignant neoplasm of peritoneum, unspecified: Secondary | ICD-10-CM

## 2023-07-19 NOTE — Telephone Encounter (Signed)
Chart reviewed. Olaparib refilled per last office note with Dr. Ellin Saba.

## 2023-08-04 NOTE — Progress Notes (Signed)
 [name] [address], [name] [address]    Clinic Day:  08/05/2023  Referring physician: [name], DO  Patient Care Team: [name], DO as PCP - General (Family Medicine) [name], MD as Referring Physician (Optometry) [name], MD as Medical Oncologist (Medical Oncology) [name], RN as Oncology Nurse Navigator (Medical Oncology)   ASSESSMENT & PLAN:   Assessment: 1. High-grade serous carcinoma of peritoneum: - She had developed sharp left-sided abdominal pain since Mother's Day, occasional radiating to the back.  40 pound weight loss in the last 6 months. - Personal history of cervical cancer in 2000, status post chemo plus XRT at [facility].  She has slightly decreased hearing in the left ear since then. - US abdomen (11/08/2021): Cholelithiasis with gallbladder wall thickening and sonographic Murphy sign.  Upper abdominal ascites. - Laparoscopy (11/13/2021): Starting of the omentum as well as the upper abdominal wall and diaphragm.  Liver was within normal limits.  6 L of ascites was evacuated.  Omental and peritoneal biopsies were sent.  Peritoneal implants throughout and a significant portion of omentum and on the small intestine. - CT CAP (11/13/2021): Ascites, extensive peritoneal thickening, nodularity and omental caking.  No evidence of primary malignancy in the chest, abdomen or pelvis.  Small left pleural effusion, nonspecific.  Cholelithiasis. - US pelvis (11/14/2021): Uterus is grossly unremarkable.  Ovaries not visualized. - CA125 (11/13/2021): 1269 - Pathology: Peritoneum and omental biopsy consistent with high-grade serous carcinoma.  HER2 2+ by IHC and negative by FISH. - 4 cycles of carboplatin and paclitaxel from 11/21/2021 through 01/23/2022, cycle 9 on 08/29/2022. - 10/01/2022: Diagnostic laparoscopy, exploratory laparotomy, omentectomy - Pathology: Right pelvic sidewall excision-high-grade  serous carcinoma, omental resection-high-grade serous carcinoma with treatment effect present.  Small bowel implant excision was negative for carcinoma. - Germline mutation testing negative - Foundation 1 NGS (10/01/2022): LOH-18.9%, HRD +, MS-stable, TMB-low - Olaparib 300 mg twice daily and bevacizumab maintenance started on 01/07/2023  2. Social/family history: - Lives at home with her husband.  Worked in Engineering geologist at [employer].  Non-smoker. - Maternal grandmother with bladder cancer.  Maternal great grandmother with breast cancer.    Plan: High-grade serous carcinoma of peritoneum: - CTAP (05/24/2023): Stable heterogeneous appearance of the uterus with tethered appearance of the terminal ileum and sigmoid colon with no new or progressive disease. - She is tolerating olaparib and bevacizumab reasonably well. - She has diarrhea which starts 1 day after each bevacizumab infusion lasting about 4 to 5 days.  Lomotil helps. - She reported abdominal pain on coughing/sneezing at the site of incisional hernia. - Reviewed labs today: Normal LFTs and creatinine.  CBC was normal.  Mild anemia from all operative stable.  CA125 has increased to 41 from 18 previously. - Recommend continue olaparib 300 mg twice daily.  Proceed with bevacizumab today.  RTC 3 weeks for follow-up.  Will repeat CTAP with contrast.   2.  Peripheral neuropathy: - Continue Cymbalta 60 mg daily.  Numbness in the bottom of the feet is stable.  Does not have any neuropathic pains.  3.  Hypomagnesemia: - Continue magnesium 3 times daily.  Magnesium is 1.8.  4.  Hypokalemia: - Continue potassium 20 mill equivalents daily.  Potassium is normal.   5.  Subsegmental pulmonary embolism (Dx 03/06/2022): - Continue Eliquis twice daily.  No significant bleeding reported.    Orders Placed This Encounter  Procedures   CT ABDOMEN PELVIS W  CONTRAST    Standing Status:   Future    Expected Date:   08/19/2023    Expiration Date:   08/04/2024     If indicated for the ordered procedure, I authorize the administration of contrast media per Radiology protocol:   Yes    Does the patient have a contrast media/X-ray dye allergy?:   No    Preferred imaging location?:   [facility]    If indicated for the ordered procedure, I authorize the administration of oral contrast media per Radiology protocol:   Yes   UA Protein, Dipstick - [facility]    Standing Status:   Future    Expected Date:   09/16/2023    Expiration Date:   09/15/2024   CA 125    Standing Status:   Future    Expected Date:   09/16/2023    Expiration Date:   09/15/2024   Magnesium    Standing Status:   Future    Expected Date:   09/16/2023    Expiration Date:   09/15/2024   CBC with Differential    Standing Status:   Future    Expected Date:   09/16/2023    Expiration Date:   09/15/2024   Comprehensive metabolic panel    Standing Status:   Future    Expected Date:   09/16/2023    Expiration Date:   09/15/2024   UA Protein, Dipstick - [facility]    Standing Status:   Future    Expected Date:   10/07/2023    Expiration Date:   10/06/2024   CA 125    Standing Status:   Future    Expected Date:   10/07/2023    Expiration Date:   10/06/2024   Magnesium    Standing Status:   Future    Expected Date:   10/07/2023    Expiration Date:   10/06/2024   CBC with Differential    Standing Status:   Future    Expected Date:   10/07/2023    Expiration Date:   10/06/2024   Comprehensive metabolic panel    Standing Status:   Future    Expected Date:   10/07/2023    Expiration Date:   10/06/2024      I,Katie Daubenspeck,acting as a scribe for [name], MD.,have documented all relevant documentation on the behalf of [name], MD,as directed by  [name], MD while in the presence of [name], MD.   I, [name] MD, have reviewed the above documentation for accuracy and completeness, and I agree with the above.   [name], MD    3/3/202511:09 AM  CHIEF COMPLAINT:   Diagnosis: high-grade serous carcinoma of peritoneum    Cancer Staging  Primary peritoneal carcinomatosis [facility]) Staging form: Ovary, Fallopian Tube, and Primary Peritoneal Carcinoma, AJCC 8th Edition - Clinical stage from 11/17/2021: FIGO Stage IIIC (cT3c, cM0) - Unsigned    Prior Therapy: Carboplatin and paclitaxel-- x 4 cycles 11/21/2021 - 01/23/2022, x5 cycles 06/05/22 - 08/29/22, x3 cycles 11/05/22 - 12/17/22   Current Therapy:  olaparib and maintenance bevacizumab    HISTORY OF PRESENT ILLNESS:   Oncology History  Malignant neoplasm of ovary (HCC)  11/15/2021 Initial Diagnosis   Malignant neoplasm of ovary (HCC)   01/07/2023 -  Chemotherapy   Patient is on Treatment Plan : OVARIAN Bevacizumab q21d     Primary peritoneal carcinomatosis (HCC)  11/13/2021 Imaging   CT A/P: 1. Trace ascites throughout the abdomen and  pelvis, with extensive peritoneal thickening, nodularity, and omental caking. Findings are consistent with widespread peritoneal and omental metastatic disease. 2. No evidence of primary malignancy in the chest, abdomen, or pelvis, with specific attention to the most likely etiologies of peritoneal metastatic disease including pancreatic, ovarian, and endometrial malignancy. Although there is no obvious mass, pelvic ultrasound may be helpful to more closely assess the female reproductive organs. 3. Small left pleural effusion and associated atelectasis or consolidation, nonspecific although worrisome for malignant effusion. No overt pleural thickening or nodularity. 4. Scattered areas of nonspecific infectious or inflammatory alveolar ground-glass airspace opacity throughout the lungs. 5. Cholelithiasis without evidence of acute cholecystitis. 6. Nonobstructive right nephrolithiasis. 7. Postoperative findings of same day laparoscopy including minimal pneumoperitoneum.   Aortic Atherosclerosis (ICD10-I70.0).   11/13/2021  Surgery   Diagnostic laparoscopy with biopsy Dr. Lovell Sheehan Findings: Significant bloody ascites, studding of the omentum as well as the upper abdominal wall and diaphragm.  Nodularity noted along the small intestine.  Omental disease inhibited view of the pelvis.   11/17/2021 Initial Diagnosis   Primary peritoneal carcinomatosis (HCC)   11/21/2021 - 01/25/2022 Chemotherapy   Patient is on Treatment Plan : OVARIAN Carboplatin (AUC 6) / Paclitaxel (175) q21d x 6 cycles     11/21/2021 - 12/17/2022 Chemotherapy   Patient is on Treatment Plan : OVARIAN Carboplatin (AUC 6) + Paclitaxel (175)  + Mvasi q21d X 6 Cycles     01/19/2022 Imaging   CT A/P: 1. Small amount of adherent thrombus or fibrin sheath around the catheter in the LEFT brachiocephalic vein. 2. Small low-attenuation area in the inter lobar fissure region associated with medial segment of LEFT hepatic lobe, new from previous imaging, may represent developing "variable fat deposition" though given history of peritoneal disease would suggest attention on subsequent imaging. 3. Diminished thickening of mesenteric reflections, decreased ascites and decreased omental infiltration since previous imaging. 4. Focal concentric narrowing of the rectosigmoid junction is nonspecific but suspicious for neoplasm potentially related to serosal involvement in the pelvis in this patient with known peritoneal disease from ovarian cancer. Underlying colonic neoplasm would be difficult to exclude as well. Colonic assessment with endoscopy or comparison with recent endoscopy performed is suggested. 5. Cholelithiasis without evidence of acute cholecystitis. 6. 4 mm RIGHT lower pole renal calculus.   Aortic Atherosclerosis (ICD10-I70.0).   03/06/2022 Imaging   CT C/A/P: Chest CT:   Scattered subsegmental pulmonary emboli in the right lung.   Abdominal CT:   1. Mild right hydroureteronephrosis from a 4 mm UVJ calculus. 2. Unchanged omental  disease. 3. Cholelithiasis and other chronic findings described above.   05/24/2022 Imaging   Increased peritoneal carcinomatosis and mild ascites.   New moderate distal small bowel obstruction, with transition point in the central pelvis adjacent to the uterine fundus, which may be due to peritoneal carcinomatosis or adhesion.   Cholelithiasis. No radiographic evidence of cholecystitis.   Aortic Atherosclerosis (ICD10-I70.0).   08/21/2022 Imaging   CT A/P: 1. Improved peritoneal carcinomatosis and mild ascites. 2. Prominent/mildly enlarged loops of small bowel throughout the abdomen with transition point in the pelvis, not significantly changed from prior and compatible with a least low-grade obstruction. 3. Cholelithiasis without findings of acute cholecystitis.   10/01/2022 Surgery   Diagnostic laparoscopy, robotic assisted laparoscopic lysis of adhesions, exploratory laparotomy, omentectomy   Findings: On EUA, small uterus within minimal mobility. On intra-abdominal entry, omentum adherent to the anterior abdominal wall at the level of the transverse colon. Some adhesions noted between the  anterior liver surface and the diaphragm on the right. Small miliary disease noted on bilateral diaphragms, small bowel, small bowel mesentery, abdominal and pelvic peritoneum. No ascites. Complete retroperitonealization of the pelvic organs. Multiple loops of small bowel were densely adherent to the uterine fundus without obvious obstruction. Terminal ileum and cecum also adherent along the right pelvic sidewall. Sigmoid colon densely adherent to a loop of ileum and to the lateral aspect of the retroperitonealized pelvis on the left. After opening the retroperitoneum bilaterally and transecting the round ligaments, the bladder flap was able to be partially developed anteriorly. This revealed dense adhesions of the sigmoid to the lateral uterus. Given prior discussions with the patient, I did not feel  that proceeding with further attempt at lysis of adhesions (some mobilization of two loops of ileum adherent to the uterine fundus was performed) would be possible without significant risk of injury to multiple loops of small bowel and to the sigmoid colon. 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 On laparotomy, 8 x 4 cm omental cake noted. No disease visualized or palpated within the lesser sac. There was no palpable disease along the liver edges or spleen. Stomach was normal in appearance. Some miliary disease palpated along the abdominal peritoneum. Numerous miliary disease noted along the small bowel and small bowel mesentery. Numerous prominent lymph nodes (1cm or less) within the stalk of the small bowel mesentery.  R1 resection based on miliary disease (in the setting of incomplete pelvic evaluation).   10/01/2022 Pathology Results   A. Pelvic sidewall, right, excision - High-grade serous carcinoma   B. Omentum, resection - High-grade serous carcinoma - Treatment effect present - See comment   C. Small bowel, implant, excision - Infarcted epiploic appendage - Negative for carcinoma  Immunohistochemical stains were performed on block B1. The tumor cells are positive for WT1, compatible with tubo-ovarian or peritoneal origin. p53 demonstrates faint positivity in rare cells, favored to be most consistent with a null pattern.    11/05/2022 Genetic Testing   Negative genetic testing on the [facility] Multi-cancer + RNA panel.  The report date is November 05, 2022.  The Multi-Cancer + RNA Panel offered by [facility] includes sequencing and/or deletion/duplication analysis of the following 70 genes:  AIP*, ALK, APC*, ATM*, AXIN2*, BAP1*, BARD1*, BLM*, BMPR1A*, BRCA1*, BRCA2*, BRIP1*, CDC73*, CDH1*, CDK4, CDKN1B*, CDKN2A, CHEK2*, CTNNA1*, DICER1*, EPCAM (del/dup only), EGFR, FH*, FLCN*, GREM1 (promoter dup only), HOXB13, KIT, LZTR1, MAX*, MBD4, MEN1*, MET,  MITF, MLH1*, MSH2*, MSH3*, MSH6*, MUTYH*, NF1*, NF2*, NTHL1*, PALB2*, PDGFRA, PMS2*, POLD1*, POLE*, POT1*, PRKAR1A*, PTCH1*, PTEN*, RAD51C*, RAD51D*, RB1*, RET, SDHA* (sequencing only), SDHAF2*, SDHB*, SDHC*, SDHD*, SMAD4*, SMARCA4*, SMARCB1*, SMARCE1*, STK11*, SUFU*, TMEM127*, TP53*, TSC1*, TSC2*, VHL*. RNA analysis is performed for * genes.    01/07/2023 -  Chemotherapy   Patient is on Treatment Plan : OVARIAN Bevacizumab q21d        INTERVAL HISTORY:   Nicole Santos is a 53 y.o. female presenting to clinic today for follow up of high-grade serous carcinoma of peritoneum. She was last seen by me on 06/03/23.  Today, she states that she is doing well overall. Her appetite level is at 100%. Her energy level is at 100%.  PAST MEDICAL HISTORY:   Past Medical History: Past Medical History:  Diagnosis Date   Acute pulmonary embolism (HCC) 03/06/2022   Acute respiratory disease due to COVID-19 virus 02/02/2019   Anxiety    Cancer (HCC)    Depression    Dyspnea  Dysrhythmia    Family history of bladder cancer    Family history of breast cancer    History of diabetes mellitus    Resolved in Sept 2023 with weight loss.   Hypertension    Ovarian cancer (HCC) 11/14/2021   Peritoneal carcinomatosis (HCC)    Pneumonia    Sleep apnea    doesn't use CPAP   Tachycardia     Surgical History: Past Surgical History:  Procedure Laterality Date   KNEE SURGERY Right    Arthroscopic   LAPAROSCOPIC ABDOMINAL EXPLORATION N/A 11/13/2021   Procedure: Exploratory laparoscopy;  Surgeon: Franky Macho, MD;  Location: [facility];  Service: General;  Laterality: N/A;   PORTACATH PLACEMENT Left 11/15/2021   Procedure: INSERTION PORT-A-CATH;  Surgeon: Franky Macho, MD;  Location: [facility];  Service: General;  Laterality: Left;   TONSILLECTOMY AND ADENOIDECTOMY      Social History: Social History   Socioeconomic History   Marital status: Married    Spouse name: Not on file   Number of children: Not on file    Years of education: Not on file   Highest education level: Not on file  Occupational History   Occupation: Unemployed  Tobacco Use   Smoking status: Never   Smokeless tobacco: Never  Vaping Use   Vaping status: Never Used  Substance and Sexual Activity   Alcohol use: No   Drug use: No   Sexual activity: Not Currently  Other Topics Concern   Not on file  Social History Narrative   Not on file   Social Drivers of Health   Financial Resource Strain: Not on file  Food Insecurity: No Food Insecurity (03/06/2022)   Hunger Vital Sign    Worried About Running Out of Food in the Last Year: Never true    Ran Out of Food in the Last Year: Never true  Transportation Needs: No Transportation Needs (03/06/2022)   PRAPARE - Administrator, Civil Service (Medical): No    Lack of Transportation (Non-Medical): No  Physical Activity: Not on file  Stress: Not on file  Social Connections: Not on file  Intimate Partner Violence: Not At Risk (03/06/2022)   Humiliation, Afraid, Rape, and Kick questionnaire    Fear of Current or Ex-Partner: No    Emotionally Abused: No    Physically Abused: No    Sexually Abused: No    Family History: Family History  Problem Relation Age of Onset   Hypertension Mother    Heart attack Paternal Uncle    Bladder Cancer Maternal Grandmother 33       non-smoker   Dementia Maternal Grandfather    Breast cancer Maternal Great-grandmother        MGF's mother   Colon cancer Neg Hx    Ovarian cancer Neg Hx    Endometrial cancer Neg Hx    Pancreatic cancer Neg Hx    Prostate cancer Neg Hx     Current Medications:  Current Outpatient Medications:    apixaban (ELIQUIS) 5 MG TABS tablet, Take 1 tablet (5 mg total) by mouth 2 (two) times daily., Disp: 180 tablet, Rfl: 3   atorvastatin (LIPITOR) 20 MG tablet, Take 1 tablet (20 mg total) by mouth at bedtime., Disp: 90 tablet, Rfl: 3   diphenoxylate-atropine (LOMOTIL) 2.5-0.025 MG tablet, Take 2 tablets by  mouth after the first watery bowel movement. May then take 1 pill by mouth after each watery bowel movement. Do not exceed 8 pills in a 24 hour  period., Disp: 60 tablet, Rfl: 3   DULoxetine (CYMBALTA) 30 MG capsule, TAKE 1 CAPSULE BY MOUTH DAILY FOR 7 DAYS, THEN INCREASE TO 2 CAPSULES DAILY, Disp: 180 capsule, Rfl: 2   lidocaine-prilocaine (EMLA) cream, Apply a quarter sized amount to port a cath site and cover with plastic wrap 1 hour prior to infusion appointments, Disp: 30 g, Rfl: 3   LYNPARZA 150 MG tablet, TAKE 2 TABLETS TWICE A DAY. MAY TAKE WITH FOOD TO DECEASE NAUSEA AND VOMITING. (SWALLOW WHOLE), Disp: 360 tablet, Rfl: 1   magnesium oxide (MAG-OX) 400 (240 Mg) MG tablet, TAKE 1 TABLET BY MOUTH TWICE A DAY, Disp: 180 tablet, Rfl: 1   Multiple Vitamin (MULTIVITAMIN) capsule, Take 1 capsule by mouth daily., Disp: , Rfl:    Potassium 99 MG TABS, Take 99 mg by mouth at bedtime., Disp: , Rfl:  No current facility-administered medications for this visit.  Facility-Administered Medications Ordered in Other Visits:    bevacizumab-awwb (MVASI) 1,400 mg in sodium chloride 0.9 % 100 mL chemo infusion, 15 mg/kg (Treatment Plan Recorded), Intravenous, Once, [name], MD   heparin lock flush 100 unit/mL, 500 Units, Intravenous, Once, [name], MD   heparin lock flush 100 unit/mL, 500 Units, Intracatheter, Once PRN, [name], MD   sodium chloride flush (NS) 0.9 % injection 10 mL, 10 mL, Intravenous, PRN, [name], MD   sodium chloride flush (NS) 0.9 % injection 10 mL, 10 mL, Intracatheter, PRN, [name], MD   Allergies: Allergies  Allergen Reactions   Augmentin [Amoxicillin-Pot Clavulanate] Diarrhea and Nausea And Vomiting   Doxycycline     Abdominal cramps.   Topamax [Topiramate] Nausea And Vomiting    REVIEW OF SYSTEMS:   Review of Systems  Constitutional:  Negative for chills, fatigue and fever.  HENT:   Negative for lump/mass,  mouth sores, nosebleeds, sore throat and trouble swallowing.   Eyes:  Negative for eye problems.  Respiratory:  Negative for cough and shortness of breath.   Cardiovascular:  Negative for chest pain, leg swelling and palpitations.  Gastrointestinal:  Positive for diarrhea. Negative for abdominal pain, constipation, nausea and vomiting.  Genitourinary:  Negative for bladder incontinence, difficulty urinating, dysuria, frequency, hematuria and nocturia.   Musculoskeletal:  Negative for arthralgias, back pain, flank pain, myalgias and neck pain.  Skin:  Negative for itching and rash.  Neurological:  Positive for numbness. Negative for dizziness and headaches.  Hematological:  Does not bruise/bleed easily.  Psychiatric/Behavioral:  Positive for sleep disturbance. Negative for depression and suicidal ideas. The patient is not nervous/anxious.   All other systems reviewed and are negative.    VITALS:   Blood pressure (!) 143/78, pulse 93, temperature 98.3 F (36.8 C), temperature source Tympanic, resp. rate 18, height 5\' 2"  (1.575 m), weight 207 lb (93.9 kg), SpO2 100%.  Wt Readings from Last 3 Encounters:  08/05/23 207 lb (93.9 kg)  07/15/23 207 lb 3.2 oz (94 kg)  07/03/23 208 lb (94.3 kg)    Body mass index is 37.86 kg/m.  Performance status (ECOG): 1 - Symptomatic but completely ambulatory  PHYSICAL EXAM:   Physical Exam Vitals and nursing note reviewed. Exam conducted with a chaperone present.  Constitutional:      Appearance: Normal appearance.  Cardiovascular:     Rate and Rhythm: Normal rate and regular rhythm.     Pulses: Normal pulses.     Heart sounds: Normal heart sounds.  Pulmonary:     Effort: Pulmonary effort is normal.  Breath sounds: Normal breath sounds.  Abdominal:     Palpations: Abdomen is soft. There is no hepatomegaly, splenomegaly or mass.     Tenderness: There is no abdominal tenderness.  Musculoskeletal:     Right lower leg: No edema.     Left lower  leg: No edema.  Lymphadenopathy:     Cervical: No cervical adenopathy.     Right cervical: No superficial, deep or posterior cervical adenopathy.    Left cervical: No superficial, deep or posterior cervical adenopathy.     Upper Body:     Right upper body: No supraclavicular or axillary adenopathy.     Left upper body: No supraclavicular or axillary adenopathy.  Neurological:     General: No focal deficit present.     Mental Status: She is alert and oriented to person, place, and time.  Psychiatric:        Mood and Affect: Mood normal.        Behavior: Behavior normal.     LABS:   CBC     Component Value Date/Time   WBC 5.3 08/05/2023 0913   RBC 2.80 (L) 08/05/2023 0913   HGB 11.0 (L) 08/05/2023 0913   HGB 9.0 (L) 02/08/2022 1459   HCT 32.6 (L) 08/05/2023 0913   HCT 27.0 (L) 02/08/2022 1459   PLT 165 08/05/2023 0913   PLT 79 (LL) 02/08/2022 1459   MCV 116.4 (H) 08/05/2023 0913   MCV 88 02/08/2022 1459   MCH 39.3 (H) 08/05/2023 0913   MCHC 33.7 08/05/2023 0913   RDW 16.4 (H) 08/05/2023 0913   RDW 23.4 (H) 02/08/2022 1459   LYMPHSABS 1.3 08/05/2023 0913   LYMPHSABS 1.2 02/08/2022 1459   MONOABS 0.5 08/05/2023 0913   EOSABS 0.2 08/05/2023 0913   EOSABS 0.1 02/08/2022 1459   BASOSABS 0.0 08/05/2023 0913   BASOSABS 0.0 02/08/2022 1459    CMP      Component Value Date/Time   NA 139 08/05/2023 0913   NA 136 03/01/2022 1242   K 3.6 08/05/2023 0913   CL 105 08/05/2023 0913   CO2 27 08/05/2023 0913   GLUCOSE 111 (H) 08/05/2023 0913   BUN 17 08/05/2023 0913   BUN 5 (L) 03/01/2022 1242   CREATININE 0.78 08/05/2023 0913   CALCIUM 8.9 08/05/2023 0913   PROT 6.6 08/05/2023 0913   PROT 6.7 03/01/2022 1242   ALBUMIN 3.5 08/05/2023 0913   ALBUMIN 3.6 (L) 03/01/2022 1242   AST 20 08/05/2023 0913   ALT 14 08/05/2023 0913   ALKPHOS 52 08/05/2023 0913   BILITOT 1.2 08/05/2023 0913   BILITOT 1.0 03/01/2022 1242   GFRNONAA >60 08/05/2023 0913   GFRAA 103 05/31/2020 1033      Lab Results  Component Value Date   CEA1 1.1 11/13/2021   /  CEA  Date Value Ref Range Status  11/13/2021 1.1 0.0 - 4.7 ng/mL Final    Comment:    (NOTE)                             Nonsmokers          <3.9                             Smokers             <5.6 [employer] Electrochemiluminescence Immunoassay (ECLIA) Values obtained with different assay methods or kits cannot be  used interchangeably.  Results cannot be interpreted as absolute evidence of the presence or absence of malignant disease. Performed At: [facility] [address] [place], [name] [id number] Jolene Schimke MD FA:[id number]    No results found for: "PSA1" Lab Results  Component Value Date   CAN199 6 11/13/2021   Lab Results  Component Value Date   CAN125 41.3 (H) 07/15/2023    No results found for: "TOTALPROTELP", "ALBUMINELP", "A1GS", "A2GS", "BETS", "BETA2SER", "GAMS", "MSPIKE", "SPEI" Lab Results  Component Value Date   FERRITIN 356 (H) 02/07/2019   FERRITIN 311 (H) 02/06/2019   FERRITIN 343 (H) 02/05/2019   Lab Results  Component Value Date   LDH 259 (H) 02/02/2019     STUDIES:   No results found.

## 2023-08-05 ENCOUNTER — Other Ambulatory Visit: Payer: Commercial Managed Care - PPO

## 2023-08-05 ENCOUNTER — Inpatient Hospital Stay: Payer: Commercial Managed Care - PPO

## 2023-08-05 ENCOUNTER — Inpatient Hospital Stay: Payer: Commercial Managed Care - PPO | Attending: Gynecologic Oncology | Admitting: Hematology

## 2023-08-05 VITALS — BP 143/78 | HR 93 | Temp 98.3°F | Resp 18 | Ht 62.0 in | Wt 207.0 lb

## 2023-08-05 VITALS — BP 115/68 | HR 90 | Temp 98.2°F | Resp 18

## 2023-08-05 DIAGNOSIS — C569 Malignant neoplasm of unspecified ovary: Secondary | ICD-10-CM

## 2023-08-05 DIAGNOSIS — Z5112 Encounter for antineoplastic immunotherapy: Secondary | ICD-10-CM | POA: Diagnosis present

## 2023-08-05 DIAGNOSIS — Z79899 Other long term (current) drug therapy: Secondary | ICD-10-CM | POA: Insufficient documentation

## 2023-08-05 DIAGNOSIS — C482 Malignant neoplasm of peritoneum, unspecified: Secondary | ICD-10-CM | POA: Diagnosis not present

## 2023-08-05 LAB — CBC WITH DIFFERENTIAL/PLATELET
Abs Immature Granulocytes: 0.04 10*3/uL (ref 0.00–0.07)
Basophils Absolute: 0 10*3/uL (ref 0.0–0.1)
Basophils Relative: 1 %
Eosinophils Absolute: 0.2 10*3/uL (ref 0.0–0.5)
Eosinophils Relative: 3 %
HCT: 32.6 % — ABNORMAL LOW (ref 36.0–46.0)
Hemoglobin: 11 g/dL — ABNORMAL LOW (ref 12.0–15.0)
Immature Granulocytes: 1 %
Lymphocytes Relative: 25 %
Lymphs Abs: 1.3 10*3/uL (ref 0.7–4.0)
MCH: 39.3 pg — ABNORMAL HIGH (ref 26.0–34.0)
MCHC: 33.7 g/dL (ref 30.0–36.0)
MCV: 116.4 fL — ABNORMAL HIGH (ref 80.0–100.0)
Monocytes Absolute: 0.5 10*3/uL (ref 0.1–1.0)
Monocytes Relative: 9 %
Neutro Abs: 3.2 10*3/uL (ref 1.7–7.7)
Neutrophils Relative %: 61 %
Platelets: 165 10*3/uL (ref 150–400)
RBC: 2.8 MIL/uL — ABNORMAL LOW (ref 3.87–5.11)
RDW: 16.4 % — ABNORMAL HIGH (ref 11.5–15.5)
WBC: 5.3 10*3/uL (ref 4.0–10.5)
nRBC: 0.8 % — ABNORMAL HIGH (ref 0.0–0.2)

## 2023-08-05 LAB — COMPREHENSIVE METABOLIC PANEL
ALT: 14 U/L (ref 0–44)
AST: 20 U/L (ref 15–41)
Albumin: 3.5 g/dL (ref 3.5–5.0)
Alkaline Phosphatase: 52 U/L (ref 38–126)
Anion gap: 7 (ref 5–15)
BUN: 17 mg/dL (ref 6–20)
CO2: 27 mmol/L (ref 22–32)
Calcium: 8.9 mg/dL (ref 8.9–10.3)
Chloride: 105 mmol/L (ref 98–111)
Creatinine, Ser: 0.78 mg/dL (ref 0.44–1.00)
GFR, Estimated: >60 mL/min (ref >60)
Glucose, Bld: 111 mg/dL — ABNORMAL HIGH (ref 70–99)
Potassium: 3.6 mmol/L (ref 3.5–5.1)
Sodium: 139 mmol/L (ref 135–145)
Total Bilirubin: 1.2 mg/dL (ref 0.0–1.2)
Total Protein: 6.6 g/dL (ref 6.5–8.1)

## 2023-08-05 LAB — MAGNESIUM: Magnesium: 1.8 mg/dL (ref 1.7–2.4)

## 2023-08-05 MED ORDER — SODIUM CHLORIDE 0.9% FLUSH
10.0000 mL | INTRAVENOUS | Status: DC | PRN
  Administered 2023-08-05: 10 mL

## 2023-08-05 MED ORDER — HEPARIN SOD (PORK) LOCK FLUSH 100 UNIT/ML IV SOLN
500.0000 [IU] | Freq: Once | INTRAVENOUS | Status: AC | PRN
  Administered 2023-08-05: 500 [IU]

## 2023-08-05 MED ORDER — SODIUM CHLORIDE 0.9 % IV SOLN: Freq: Once | INTRAVENOUS | Status: AC

## 2023-08-05 MED ORDER — SODIUM CHLORIDE 0.9 % IV SOLN
15.0000 mg/kg | Freq: Once | INTRAVENOUS | Status: AC
  Administered 2023-08-05: 1400 mg via INTRAVENOUS
  Filled 2023-08-05: qty 48

## 2023-08-05 NOTE — Progress Notes (Signed)
 Patient presents today for [facility] infusion per providers order.  Vital signs and labs reviewed by MD.  Message received from Chapman Moss RN/Dr. Ellin Saba, patient okay for treatment.  Treatment given today per MD orders.  Stable during infusion without adverse affects.  Vital signs stable.  No complaints at this time.  Discharge from clinic ambulatory in stable condition.  Alert and oriented X 3.  Follow up with [facility] as scheduled.

## 2023-08-05 NOTE — Patient Instructions (Signed)
 [facility] [place] - A DEPT OF [facility]  Discharge Instructions: Thank you for choosing [facility] to provide your oncology and hematology care.  If you have a lab appointment with the Cancer Center - please note that after April 8th, 2024, all labs will be drawn in the cancer center.  You do not have to check in or register with the main entrance as you have in the past but will complete your check-in in the cancer center.  Wear comfortable clothing and clothing appropriate for easy access to any Portacath or PICC line.   We strive to give you quality time with your provider. You may need to reschedule your appointment if you arrive late (15 or more minutes).  Arriving late affects you and other patients whose appointments are after yours.  Also, if you miss three or more appointments without notifying the office, you may be dismissed from the clinic at the provider's discretion.      For prescription refill requests, have your pharmacy contact our office and allow 72 hours for refills to be completed.    Today you received the following chemotherapy and/or immunotherapy agents [facility]      To help prevent nausea and vomiting after your treatment, we encourage you to take your nausea medication as directed.  BELOW ARE SYMPTOMS THAT SHOULD BE REPORTED IMMEDIATELY: *FEVER GREATER THAN 100.4 F (38 C) OR HIGHER *CHILLS OR SWEATING *NAUSEA AND VOMITING THAT IS NOT CONTROLLED WITH YOUR NAUSEA MEDICATION *UNUSUAL SHORTNESS OF BREATH *UNUSUAL BRUISING OR BLEEDING *URINARY PROBLEMS (pain or burning when urinating, or frequent urination) *BOWEL PROBLEMS (unusual diarrhea, constipation, pain near the anus) TENDERNESS IN MOUTH AND THROAT WITH OR WITHOUT PRESENCE OF ULCERS (sore throat, sores in mouth, or a toothache) UNUSUAL RASH, SWELLING OR PAIN  UNUSUAL VAGINAL DISCHARGE OR ITCHING   Items with * indicate a potential emergency and should be followed up as  soon as possible or go to the Emergency Department if any problems should occur.  Please show the CHEMOTHERAPY ALERT CARD or IMMUNOTHERAPY ALERT CARD at check-in to the Emergency Department and triage nurse.  Should you have questions after your visit or need to cancel or reschedule your appointment, please contact [facility] [place] - A DEPT OF [facility] [phone]  and follow the prompts.  Office hours are 8:00 a.m. to 4:30 p.m. Monday - Friday. Please note that voicemails left after 4:00 p.m. may not be returned until the following business day.  We are closed weekends and major holidays. You have access to a nurse at all times for urgent questions. Please call the main number to the clinic [phone] and follow the prompts.  For any non-urgent questions, you may also contact your provider using MyChart. We now offer e-Visits for anyone 70 and older to request care online for non-urgent symptoms. For details visit [email or web address].   Also download the MyChart app! Go to the app store, search "MyChart", open the app, select , and log in with your MyChart username and password.

## 2023-08-05 NOTE — Progress Notes (Signed)
Patient is taking [place] as prescribed. She has not missed any doses and reports no side effects at this time.    Patient has been examined by Dr. Ellin Saba. Vital signs and labs have been reviewed by MD - ANC, Creatinine, LFTs, hemoglobin, and platelets are within treatment parameters per M.D. - pt may proceed with treatment.  Primary RN and pharmacy notified.

## 2023-08-05 NOTE — Patient Instructions (Signed)

## 2023-08-06 LAB — CA 125: Cancer Antigen (CA) 125: 53.9 U/mL — ABNORMAL HIGH (ref 0.0–38.1)

## 2023-08-20 ENCOUNTER — Ambulatory Visit (HOSPITAL_COMMUNITY)
Admission: RE | Admit: 2023-08-20 | Discharge: 2023-08-20 | Disposition: A | Discharge status: Home / Self Care | Source: Ambulatory Visit | Attending: Hematology | Admitting: Hematology

## 2023-08-20 ENCOUNTER — Encounter (HOSPITAL_COMMUNITY): Payer: Self-pay | Admitting: Radiology

## 2023-08-20 DIAGNOSIS — C482 Malignant neoplasm of peritoneum, unspecified: Secondary | ICD-10-CM | POA: Insufficient documentation

## 2023-08-20 MED ORDER — IOHEXOL 300 MG/ML  SOLN
100.0000 mL | Freq: Once | INTRAMUSCULAR | Status: AC | PRN
  Administered 2023-08-20: 100 mL via INTRAVENOUS

## 2023-08-26 ENCOUNTER — Inpatient Hospital Stay: Payer: Commercial Managed Care - PPO

## 2023-08-26 ENCOUNTER — Encounter: Payer: Self-pay | Admitting: Hematology

## 2023-08-26 ENCOUNTER — Inpatient Hospital Stay (HOSPITAL_BASED_OUTPATIENT_CLINIC_OR_DEPARTMENT_OTHER): Admitting: Hematology

## 2023-08-26 ENCOUNTER — Other Ambulatory Visit

## 2023-08-26 ENCOUNTER — Inpatient Hospital Stay

## 2023-08-26 VITALS — BP 134/72 | HR 99 | Temp 97.5°F | Resp 18

## 2023-08-26 DIAGNOSIS — C569 Malignant neoplasm of unspecified ovary: Secondary | ICD-10-CM

## 2023-08-26 DIAGNOSIS — Z5112 Encounter for antineoplastic immunotherapy: Secondary | ICD-10-CM | POA: Diagnosis not present

## 2023-08-26 DIAGNOSIS — C482 Malignant neoplasm of peritoneum, unspecified: Secondary | ICD-10-CM

## 2023-08-26 DIAGNOSIS — Z95828 Presence of other vascular implants and grafts: Secondary | ICD-10-CM

## 2023-08-26 LAB — CBC WITH DIFFERENTIAL/PLATELET
Abs Immature Granulocytes: 0.02 10*3/uL (ref 0.00–0.07)
Basophils Absolute: 0 10*3/uL (ref 0.0–0.1)
Basophils Relative: 1 %
Eosinophils Absolute: 0.2 10*3/uL (ref 0.0–0.5)
Eosinophils Relative: 4 %
HCT: 33 % — ABNORMAL LOW (ref 36.0–46.0)
Hemoglobin: 11.2 g/dL — ABNORMAL LOW (ref 12.0–15.0)
Immature Granulocytes: 0 %
Lymphocytes Relative: 25 %
Lymphs Abs: 1.1 10*3/uL (ref 0.7–4.0)
MCH: 39.3 pg — ABNORMAL HIGH (ref 26.0–34.0)
MCHC: 33.9 g/dL (ref 30.0–36.0)
MCV: 115.8 fL — ABNORMAL HIGH (ref 80.0–100.0)
Monocytes Absolute: 0.4 10*3/uL (ref 0.1–1.0)
Monocytes Relative: 9 %
Neutro Abs: 2.8 10*3/uL (ref 1.7–7.7)
Neutrophils Relative %: 61 %
Platelets: 152 10*3/uL (ref 150–400)
RBC: 2.85 MIL/uL — ABNORMAL LOW (ref 3.87–5.11)
RDW: 16.9 % — ABNORMAL HIGH (ref 11.5–15.5)
WBC: 4.5 10*3/uL (ref 4.0–10.5)
nRBC: 1.1 % — ABNORMAL HIGH (ref 0.0–0.2)

## 2023-08-26 LAB — URINALYSIS, DIPSTICK ONLY
Bilirubin Urine: NEGATIVE
Glucose, UA: NEGATIVE mg/dL
Hgb urine dipstick: NEGATIVE
Ketones, ur: NEGATIVE mg/dL
Nitrite: NEGATIVE
Protein, ur: 100 mg/dL — AB
Specific Gravity, Urine: 1.027 (ref 1.005–1.030)
pH: 5 (ref 5.0–8.0)

## 2023-08-26 LAB — COMPREHENSIVE METABOLIC PANEL
ALT: 15 U/L (ref 0–44)
AST: 22 U/L (ref 15–41)
Albumin: 3.2 g/dL — ABNORMAL LOW (ref 3.5–5.0)
Alkaline Phosphatase: 56 U/L (ref 38–126)
Anion gap: 8 (ref 5–15)
BUN: 10 mg/dL (ref 6–20)
CO2: 24 mmol/L (ref 22–32)
Calcium: 8.7 mg/dL — ABNORMAL LOW (ref 8.9–10.3)
Chloride: 105 mmol/L (ref 98–111)
Creatinine, Ser: 0.81 mg/dL (ref 0.44–1.00)
GFR, Estimated: >60 mL/min (ref >60)
Glucose, Bld: 131 mg/dL — ABNORMAL HIGH (ref 70–99)
Potassium: 3.6 mmol/L (ref 3.5–5.1)
Sodium: 137 mmol/L (ref 135–145)
Total Bilirubin: 0.8 mg/dL (ref 0.0–1.2)
Total Protein: 6.3 g/dL — ABNORMAL LOW (ref 6.5–8.1)

## 2023-08-26 LAB — MAGNESIUM: Magnesium: 1.7 mg/dL (ref 1.7–2.4)

## 2023-08-26 MED ORDER — SODIUM CHLORIDE 0.9% FLUSH
10.0000 mL | INTRAVENOUS | Status: DC | PRN
  Administered 2023-08-26: 10 mL via INTRAVENOUS

## 2023-08-26 MED ORDER — HEPARIN SOD (PORK) LOCK FLUSH 100 UNIT/ML IV SOLN
500.0000 [IU] | Freq: Once | INTRAVENOUS | Status: AC
  Administered 2023-08-26: 500 [IU] via INTRAVENOUS

## 2023-08-26 NOTE — Progress Notes (Signed)
 [facility] [address], [name] [address]    Clinic Day:  08/26/2023  Referring physician: [name], DO  Patient Care Team: [name], DO as PCP - General (Family Medicine) [name], MD as Referring Physician (Optometry) [name], MD as Medical Oncologist (Medical Oncology) [name], RN as Oncology Nurse Navigator (Medical Oncology)   ASSESSMENT & PLAN:   Assessment: 1. High-grade serous carcinoma of peritoneum: - She had developed sharp left-sided abdominal pain since Mother's Day, occasional radiating to the back.  40 pound weight loss in the last 6 months. - Personal history of cervical cancer in 2000, status post chemo plus XRT at [facility].  She has slightly decreased hearing in the left ear since then. - US abdomen (11/08/2021): Cholelithiasis with gallbladder wall thickening and sonographic Murphy sign.  Upper abdominal ascites. - Laparoscopy (11/13/2021): Starting of the omentum as well as the upper abdominal wall and diaphragm.  Liver was within normal limits.  6 L of ascites was evacuated.  Omental and peritoneal biopsies were sent.  Peritoneal implants throughout and a significant portion of omentum and on the small intestine. - CT CAP (11/13/2021): Ascites, extensive peritoneal thickening, nodularity and omental caking.  No evidence of primary malignancy in the chest, abdomen or pelvis.  Small left pleural effusion, nonspecific.  Cholelithiasis. - US pelvis (11/14/2021): Uterus is grossly unremarkable.  Ovaries not visualized. - CA125 (11/13/2021): 1269 - Pathology: Peritoneum and omental biopsy consistent with high-grade serous carcinoma.  HER2 2+ by IHC and negative by FISH. - 4 cycles of carboplatin and paclitaxel from 11/21/2021 through 01/23/2022, cycle 9 on 08/29/2022. - 10/01/2022: Diagnostic laparoscopy, exploratory laparotomy, omentectomy - Pathology: Right pelvic sidewall excision-high-grade  serous carcinoma, omental resection-high-grade serous carcinoma with treatment effect present.  Small bowel implant excision was negative for carcinoma. - Germline mutation testing negative - Foundation 1 NGS (10/01/2022): LOH-18.9%, HRD +, MS-stable, TMB-low - Olaparib 300 mg twice daily and bevacizumab maintenance started on 01/07/2023  2. Social/family history: - Lives at home with her husband.  Worked in Engineering geologist at [employer].  Non-smoker. - Maternal grandmother with bladder cancer.  Maternal great grandmother with breast cancer.    Plan: High-grade serous carcinoma of peritoneum: - Reports that she gets diarrhea which lasts about 4 to 5 days after each bevacizumab infusion.  She takes Lomotil which helps. - She reports momentary pain at the site of incisional hernia when she coughs/sneezes. - CTAP (08/20/2023): No metastatic disease.  Redemonstration of heterogeneous appearance of the uterus in addition between the right fundal region of the uterus and terminal ileum without associated bowel obstruction. - Labs today: LFTs show albumin low at 3.2.  Creatinine normal.  CBC grossly normal.  CA125 has increased to 53 on 08/05/2023 from 41 on 07/15/2023.  UA today shows protein of 100. - Will hold bevacizumab today.  Will recheck UA next week and if protein comes below 100, will restart bevacizumab. - RTC 6 weeks for follow-up. - She has an appointment with Dr. Pricilla Holm in April.   2.  Peripheral neuropathy: - Continue Cymbalta 60 mg daily.  Bottom of the feet numbness is stable.  3.  Hypomagnesemia: - Continue mag 3 times daily.  Magnesium is normal.  4.  Hypokalemia: - Continue K-Dur 20 mill equivalents daily.  Potassium is normal.   5.  Subsegmental pulmonary embolism (Dx 03/06/2022): - Continue Eliquis twice daily.  No significant bleeding reported.    Orders Placed This  Encounter  Procedures   UA Protein, Dipstick - [facility]    Standing Status:   Future    Expected Date:   10/28/2023     Expiration Date:   10/27/2024   CA 125    Standing Status:   Future    Expected Date:   10/28/2023    Expiration Date:   10/27/2024   Magnesium    Standing Status:   Future    Expected Date:   10/28/2023    Expiration Date:   10/27/2024   CBC with Differential    Standing Status:   Future    Expected Date:   10/28/2023    Expiration Date:   10/27/2024   Comprehensive metabolic panel    Standing Status:   Future    Expected Date:   10/28/2023    Expiration Date:   10/27/2024   UA Protein, Dipstick - [facility]    Standing Status:   Future    Expected Date:   11/18/2023    Expiration Date:   11/17/2024   CA 125    Standing Status:   Future    Expected Date:   11/18/2023    Expiration Date:   11/17/2024   Magnesium    Standing Status:   Future    Expected Date:   11/18/2023    Expiration Date:   11/17/2024   CBC with Differential    Standing Status:   Future    Expected Date:   11/18/2023    Expiration Date:   11/17/2024   Comprehensive metabolic panel    Standing Status:   Future    Expected Date:   11/18/2023    Expiration Date:   11/17/2024      I,Katie Daubenspeck,acting as a scribe for [name], MD.,have documented all relevant documentation on the behalf of [name], MD,as directed by  [name], MD while in the presence of [name], MD.   I, [name] MD, have reviewed the above documentation for accuracy and completeness, and I agree with the above.   [name], MD   3/24/202512:21 PM  CHIEF COMPLAINT:   Diagnosis: high-grade serous carcinoma of peritoneum    Cancer Staging  Primary peritoneal carcinomatosis [facility]) Staging form: Ovary, Fallopian Tube, and Primary Peritoneal Carcinoma, AJCC 8th Edition - Clinical stage from 11/17/2021: FIGO Stage IIIC (cT3c, cM0) - Unsigned    Prior Therapy: Carboplatin and paclitaxel-- x 4 cycles 11/21/2021 - 01/23/2022, x5 cycles 06/05/22 - 08/29/22, x3 cycles 11/05/22 - 12/17/22    Current Therapy:  olaparib and maintenance bevacizumab    HISTORY OF PRESENT ILLNESS:   Oncology History  Malignant neoplasm of ovary (HCC)  11/15/2021 Initial Diagnosis   Malignant neoplasm of ovary (HCC)   01/07/2023 -  Chemotherapy   Patient is on Treatment Plan : OVARIAN Bevacizumab q21d     Primary peritoneal carcinomatosis (HCC)  11/13/2021 Imaging   CT A/P: 1. Trace ascites throughout the abdomen and pelvis, with extensive peritoneal thickening, nodularity, and omental caking. Findings are consistent with widespread peritoneal and omental metastatic disease. 2. No evidence of primary malignancy in the chest, abdomen, or pelvis, with specific attention to the most likely etiologies of peritoneal metastatic disease including pancreatic, ovarian, and endometrial malignancy. Although there is no obvious mass, pelvic ultrasound may be helpful to more closely assess the female reproductive organs. 3. Small left pleural effusion and associated atelectasis or consolidation, nonspecific although worrisome for malignant effusion. No overt pleural thickening or nodularity. 4. Scattered areas  of nonspecific infectious or inflammatory alveolar ground-glass airspace opacity throughout the lungs. 5. Cholelithiasis without evidence of acute cholecystitis. 6. Nonobstructive right nephrolithiasis. 7. Postoperative findings of same day laparoscopy including minimal pneumoperitoneum.   Aortic Atherosclerosis (ICD10-I70.0).   11/13/2021 Surgery   Diagnostic laparoscopy with biopsy Dr. Lovell Sheehan Findings: Significant bloody ascites, studding of the omentum as well as the upper abdominal wall and diaphragm.  Nodularity noted along the small intestine.  Omental disease inhibited view of the pelvis.   11/17/2021 Initial Diagnosis   Primary peritoneal carcinomatosis (HCC)   11/21/2021 - 01/25/2022 Chemotherapy   Patient is on Treatment Plan : OVARIAN Carboplatin (AUC 6) / Paclitaxel (175) q21d  x 6 cycles     11/21/2021 - 12/17/2022 Chemotherapy   Patient is on Treatment Plan : OVARIAN Carboplatin (AUC 6) + Paclitaxel (175)  + Mvasi q21d X 6 Cycles     01/19/2022 Imaging   CT A/P: 1. Small amount of adherent thrombus or fibrin sheath around the catheter in the LEFT brachiocephalic vein. 2. Small low-attenuation area in the inter lobar fissure region associated with medial segment of LEFT hepatic lobe, new from previous imaging, may represent developing "variable fat deposition" though given history of peritoneal disease would suggest attention on subsequent imaging. 3. Diminished thickening of mesenteric reflections, decreased ascites and decreased omental infiltration since previous imaging. 4. Focal concentric narrowing of the rectosigmoid junction is nonspecific but suspicious for neoplasm potentially related to serosal involvement in the pelvis in this patient with known peritoneal disease from ovarian cancer. Underlying colonic neoplasm would be difficult to exclude as well. Colonic assessment with endoscopy or comparison with recent endoscopy performed is suggested. 5. Cholelithiasis without evidence of acute cholecystitis. 6. 4 mm RIGHT lower pole renal calculus.   Aortic Atherosclerosis (ICD10-I70.0).   03/06/2022 Imaging   CT C/A/P: Chest CT:   Scattered subsegmental pulmonary emboli in the right lung.   Abdominal CT:   1. Mild right hydroureteronephrosis from a 4 mm UVJ calculus. 2. Unchanged omental disease. 3. Cholelithiasis and other chronic findings described above.   05/24/2022 Imaging   Increased peritoneal carcinomatosis and mild ascites.   New moderate distal small bowel obstruction, with transition point in the central pelvis adjacent to the uterine fundus, which may be due to peritoneal carcinomatosis or adhesion.   Cholelithiasis. No radiographic evidence of cholecystitis.   Aortic Atherosclerosis (ICD10-I70.0).   08/21/2022 Imaging   CT  A/P: 1. Improved peritoneal carcinomatosis and mild ascites. 2. Prominent/mildly enlarged loops of small bowel throughout the abdomen with transition point in the pelvis, not significantly changed from prior and compatible with a least low-grade obstruction. 3. Cholelithiasis without findings of acute cholecystitis.   10/01/2022 Surgery   Diagnostic laparoscopy, robotic assisted laparoscopic lysis of adhesions, exploratory laparotomy, omentectomy   Findings: On EUA, small uterus within minimal mobility. On intra-abdominal entry, omentum adherent to the anterior abdominal wall at the level of the transverse colon. Some adhesions noted between the anterior liver surface and the diaphragm on the right. Small miliary disease noted on bilateral diaphragms, small bowel, small bowel mesentery, abdominal and pelvic peritoneum. No ascites. Complete retroperitonealization of the pelvic organs. Multiple loops of small bowel were densely adherent to the uterine fundus without obvious obstruction. Terminal ileum and cecum also adherent along the right pelvic sidewall. Sigmoid colon densely adherent to a loop of ileum and to the lateral aspect of the retroperitonealized pelvis on the left. After opening the retroperitoneum bilaterally and transecting the round ligaments, the bladder flap  was able to be partially developed anteriorly. This revealed dense adhesions of the sigmoid to the lateral uterus. Given prior discussions with the patient, I did not feel that proceeding with further attempt at lysis of adhesions (some mobilization of two loops of ileum adherent to the uterine fundus was performed) would be possible without significant risk of injury to multiple loops of small bowel and to the sigmoid colon. 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 On laparotomy, 8 x 4 cm omental cake noted. No disease visualized or palpated within the lesser sac. There was no  palpable disease along the liver edges or spleen. Stomach was normal in appearance. Some miliary disease palpated along the abdominal peritoneum. Numerous miliary disease noted along the small bowel and small bowel mesentery. Numerous prominent lymph nodes (1cm or less) within the stalk of the small bowel mesentery.  R1 resection based on miliary disease (in the setting of incomplete pelvic evaluation).   10/01/2022 Pathology Results   A. Pelvic sidewall, right, excision - High-grade serous carcinoma   B. Omentum, resection - High-grade serous carcinoma - Treatment effect present - See comment   C. Small bowel, implant, excision - Infarcted epiploic appendage - Negative for carcinoma  Immunohistochemical stains were performed on block B1. The tumor cells are positive for WT1, compatible with tubo-ovarian or peritoneal origin. p53 demonstrates faint positivity in rare cells, favored to be most consistent with a null pattern.    11/05/2022 Genetic Testing   Negative genetic testing on the [facility] Multi-cancer + RNA panel.  The report date is November 05, 2022.  The Multi-Cancer + RNA Panel offered by [facility] includes sequencing and/or deletion/duplication analysis of the following 70 genes:  AIP*, ALK, APC*, ATM*, AXIN2*, BAP1*, BARD1*, BLM*, BMPR1A*, BRCA1*, BRCA2*, BRIP1*, CDC73*, CDH1*, CDK4, CDKN1B*, CDKN2A, CHEK2*, CTNNA1*, DICER1*, EPCAM (del/dup only), EGFR, FH*, FLCN*, GREM1 (promoter dup only), HOXB13, KIT, LZTR1, MAX*, MBD4, MEN1*, MET, MITF, MLH1*, MSH2*, MSH3*, MSH6*, MUTYH*, NF1*, NF2*, NTHL1*, PALB2*, PDGFRA, PMS2*, POLD1*, POLE*, POT1*, PRKAR1A*, PTCH1*, PTEN*, RAD51C*, RAD51D*, RB1*, RET, SDHA* (sequencing only), SDHAF2*, SDHB*, SDHC*, SDHD*, SMAD4*, SMARCA4*, SMARCB1*, SMARCE1*, STK11*, SUFU*, TMEM127*, TP53*, TSC1*, TSC2*, VHL*. RNA analysis is performed for * genes.    01/07/2023 -  Chemotherapy   Patient is on Treatment Plan : OVARIAN Bevacizumab q21d        INTERVAL HISTORY:    Loryn is a 53 y.o. female presenting to clinic today for follow up of high-grade serous carcinoma of peritoneum. She was last seen by me on 08/05/23.  Since her last visit, she underwent restaging CT A/P on 08/20/23 showing: no metastatic disease; redemonstration of heterogeneous appearance of uterus and adhesion between right fundal region of uterus and terminal ileum, favor posttreatment changes.  Today, she states that she is doing well overall. Her appetite level is at 100%. Her energy level is at 100%.  PAST MEDICAL HISTORY:   Past Medical History: Past Medical History:  Diagnosis Date   Acute pulmonary embolism (HCC) 03/06/2022   Acute respiratory disease due to COVID-19 virus 02/02/2019   Anxiety    Cancer (HCC)    Depression    Dyspnea    Dysrhythmia    Family history of bladder cancer    Family history of breast cancer    History of diabetes mellitus    Resolved in Sept 2023 with weight loss.   Hypertension    Ovarian cancer (HCC) 11/14/2021   Peritoneal carcinomatosis (HCC)    Pneumonia  Sleep apnea    doesn't use CPAP   Tachycardia     Surgical History: Past Surgical History:  Procedure Laterality Date   KNEE SURGERY Right    Arthroscopic   LAPAROSCOPIC ABDOMINAL EXPLORATION N/A 11/13/2021   Procedure: Exploratory laparoscopy;  Surgeon: Franky Macho, MD;  Location: [facility];  Service: General;  Laterality: N/A;   PORTACATH PLACEMENT Left 11/15/2021   Procedure: INSERTION PORT-A-CATH;  Surgeon: Franky Macho, MD;  Location: [facility];  Service: General;  Laterality: Left;   TONSILLECTOMY AND ADENOIDECTOMY      Social History: Social History   Socioeconomic History   Marital status: Married    Spouse name: Not on file   Number of children: Not on file   Years of education: Not on file   Highest education level: Not on file  Occupational History   Occupation: Unemployed  Tobacco Use   Smoking status: Never   Smokeless tobacco: Never  Vaping Use   Vaping  status: Never Used  Substance and Sexual Activity   Alcohol use: No   Drug use: No   Sexual activity: Not Currently  Other Topics Concern   Not on file  Social History Narrative   Not on file   Social Drivers of Health   Financial Resource Strain: Not on file  Food Insecurity: No Food Insecurity (03/06/2022)   Hunger Vital Sign    Worried About Running Out of Food in the Last Year: Never true    Ran Out of Food in the Last Year: Never true  Transportation Needs: No Transportation Needs (03/06/2022)   PRAPARE - Administrator, Civil Service (Medical): No    Lack of Transportation (Non-Medical): No  Physical Activity: Not on file  Stress: Not on file  Social Connections: Not on file  Intimate Partner Violence: Not At Risk (03/06/2022)   Humiliation, Afraid, Rape, and Kick questionnaire    Fear of Current or Ex-Partner: No    Emotionally Abused: No    Physically Abused: No    Sexually Abused: No    Family History: Family History  Problem Relation Age of Onset   Hypertension Mother    Heart attack Paternal Uncle    Bladder Cancer Maternal Grandmother 46       non-smoker   Dementia Maternal Grandfather    Breast cancer Maternal Great-grandmother        MGF's mother   Colon cancer Neg Hx    Ovarian cancer Neg Hx    Endometrial cancer Neg Hx    Pancreatic cancer Neg Hx    Prostate cancer Neg Hx     Current Medications:  Current Outpatient Medications:    apixaban (ELIQUIS) 5 MG TABS tablet, Take 1 tablet (5 mg total) by mouth 2 (two) times daily., Disp: 180 tablet, Rfl: 3   atorvastatin (LIPITOR) 20 MG tablet, Take 1 tablet (20 mg total) by mouth at bedtime., Disp: 90 tablet, Rfl: 3   diphenoxylate-atropine (LOMOTIL) 2.5-0.025 MG tablet, Take 2 tablets by mouth after the first watery bowel movement. May then take 1 pill by mouth after each watery bowel movement. Do not exceed 8 pills in a 24 hour period., Disp: 60 tablet, Rfl: 3   DULoxetine (CYMBALTA) 30 MG  capsule, TAKE 1 CAPSULE BY MOUTH DAILY FOR 7 DAYS, THEN INCREASE TO 2 CAPSULES DAILY, Disp: 180 capsule, Rfl: 2   lidocaine-prilocaine (EMLA) cream, Apply a quarter sized amount to port a cath site and cover with plastic wrap 1 hour prior  to infusion appointments, Disp: 30 g, Rfl: 3   LYNPARZA 150 MG tablet, TAKE 2 TABLETS TWICE A DAY. MAY TAKE WITH FOOD TO DECEASE NAUSEA AND VOMITING. (SWALLOW WHOLE), Disp: 360 tablet, Rfl: 1   magnesium oxide (MAG-OX) 400 (240 Mg) MG tablet, TAKE 1 TABLET BY MOUTH TWICE A DAY, Disp: 180 tablet, Rfl: 1   Multiple Vitamin (MULTIVITAMIN) capsule, Take 1 capsule by mouth daily., Disp: , Rfl:    Potassium 99 MG TABS, Take 99 mg by mouth at bedtime., Disp: , Rfl:  No current facility-administered medications for this visit.  Facility-Administered Medications Ordered in Other Visits:    heparin lock flush 100 unit/mL, 500 Units, Intravenous, Once, [name], MD   sodium chloride flush (NS) 0.9 % injection 10 mL, 10 mL, Intravenous, PRN, [name], MD   Allergies: Allergies  Allergen Reactions   Augmentin [Amoxicillin-Pot Clavulanate] Diarrhea and Nausea And Vomiting   Doxycycline     Abdominal cramps.   Topamax [Topiramate] Nausea And Vomiting    REVIEW OF SYSTEMS:   Review of Systems  Constitutional:  Negative for chills, fatigue and fever.  HENT:   Negative for lump/mass, mouth sores, nosebleeds, sore throat and trouble swallowing.   Eyes:  Negative for eye problems.  Respiratory:  Negative for cough and shortness of breath.   Cardiovascular:  Negative for chest pain, leg swelling and palpitations.  Gastrointestinal:  Positive for diarrhea. Negative for abdominal pain, constipation, nausea and vomiting.  Genitourinary:  Negative for bladder incontinence, difficulty urinating, dysuria, frequency, hematuria and nocturia.   Musculoskeletal:  Negative for arthralgias, back pain, flank pain, myalgias and neck pain.  Skin:  Negative for  itching and rash.  Neurological:  Negative for dizziness, headaches and numbness.  Hematological:  Does not bruise/bleed easily.  Psychiatric/Behavioral:  Negative for depression, sleep disturbance and suicidal ideas. The patient is not nervous/anxious.   All other systems reviewed and are negative.    VITALS:   There were no vitals taken for this visit.  Wt Readings from Last 3 Encounters:  08/05/23 207 lb (93.9 kg)  07/15/23 207 lb 3.2 oz (94 kg)  07/03/23 208 lb (94.3 kg)    There is no height or weight on file to calculate BMI.  Performance status (ECOG): 1 - Symptomatic but completely ambulatory  PHYSICAL EXAM:   Physical Exam Vitals and nursing note reviewed. Exam conducted with a chaperone present.  Constitutional:      Appearance: Normal appearance.  Cardiovascular:     Rate and Rhythm: Normal rate and regular rhythm.     Pulses: Normal pulses.     Heart sounds: Normal heart sounds.  Pulmonary:     Effort: Pulmonary effort is normal.     Breath sounds: Normal breath sounds.  Abdominal:     Palpations: Abdomen is soft. There is no hepatomegaly, splenomegaly or mass.     Tenderness: There is no abdominal tenderness.  Musculoskeletal:     Right lower leg: No edema.     Left lower leg: No edema.  Lymphadenopathy:     Cervical: No cervical adenopathy.     Right cervical: No superficial, deep or posterior cervical adenopathy.    Left cervical: No superficial, deep or posterior cervical adenopathy.     Upper Body:     Right upper body: No supraclavicular or axillary adenopathy.     Left upper body: No supraclavicular or axillary adenopathy.  Neurological:     General: No focal deficit present.  Mental Status: She is alert and oriented to person, place, and time.  Psychiatric:        Mood and Affect: Mood normal.        Behavior: Behavior normal.     LABS:   CBC     Component Value Date/Time   WBC 4.5 08/26/2023 1056   RBC 2.85 (L) 08/26/2023 1056    HGB 11.2 (L) 08/26/2023 1056   HGB 9.0 (L) 02/08/2022 1459   HCT 33.0 (L) 08/26/2023 1056   HCT 27.0 (L) 02/08/2022 1459   PLT 152 08/26/2023 1056   PLT 79 (LL) 02/08/2022 1459   MCV 115.8 (H) 08/26/2023 1056   MCV 88 02/08/2022 1459   MCH 39.3 (H) 08/26/2023 1056   MCHC 33.9 08/26/2023 1056   RDW 16.9 (H) 08/26/2023 1056   RDW 23.4 (H) 02/08/2022 1459   LYMPHSABS 1.1 08/26/2023 1056   LYMPHSABS 1.2 02/08/2022 1459   MONOABS 0.4 08/26/2023 1056   EOSABS 0.2 08/26/2023 1056   EOSABS 0.1 02/08/2022 1459   BASOSABS 0.0 08/26/2023 1056   BASOSABS 0.0 02/08/2022 1459    CMP      Component Value Date/Time   NA 137 08/26/2023 1056   NA 136 03/01/2022 1242   K 3.6 08/26/2023 1056   CL 105 08/26/2023 1056   CO2 24 08/26/2023 1056   GLUCOSE 131 (H) 08/26/2023 1056   BUN 10 08/26/2023 1056   BUN 5 (L) 03/01/2022 1242   CREATININE 0.81 08/26/2023 1056   CALCIUM 8.7 (L) 08/26/2023 1056   PROT 6.3 (L) 08/26/2023 1056   PROT 6.7 03/01/2022 1242   ALBUMIN 3.2 (L) 08/26/2023 1056   ALBUMIN 3.6 (L) 03/01/2022 1242   AST 22 08/26/2023 1056   ALT 15 08/26/2023 1056   ALKPHOS 56 08/26/2023 1056   BILITOT 0.8 08/26/2023 1056   BILITOT 1.0 03/01/2022 1242   GFRNONAA >60 08/26/2023 1056   GFRAA 103 05/31/2020 1033     Lab Results  Component Value Date   CEA1 1.1 11/13/2021   /  CEA  Date Value Ref Range Status  11/13/2021 1.1 0.0 - 4.7 ng/mL Final    Comment:    (NOTE)                             Nonsmokers          <3.9                             Smokers             <5.6 [employer] Electrochemiluminescence Immunoassay (ECLIA) Values obtained with different assay methods or kits cannot be used interchangeably.  Results cannot be interpreted as absolute evidence of the presence or absence of malignant disease. Performed At: [facility] [address] [place], [name] [id number] Jolene Schimke MD WU:[id number]    No results found for: "PSA1" Lab Results   Component Value Date   CAN199 6 11/13/2021   Lab Results  Component Value Date   CAN125 53.9 (H) 08/05/2023    No results found for: "TOTALPROTELP", "ALBUMINELP", "A1GS", "A2GS", "BETS", "BETA2SER", "GAMS", "MSPIKE", "SPEI" Lab Results  Component Value Date   FERRITIN 356 (H) 02/07/2019   FERRITIN 311 (H) 02/06/2019   FERRITIN 343 (H) 02/05/2019   Lab Results  Component Value Date   LDH 259 (H) 02/02/2019     STUDIES:   CT ABDOMEN  PELVIS W CONTRAST Result Date: 08/23/2023 CLINICAL DATA:  Ovarian cancer, monitor.  * Tracking Code: BO * EXAM: CT ABDOMEN AND PELVIS WITH CONTRAST TECHNIQUE: Multidetector CT imaging of the abdomen and pelvis was performed using the standard protocol following bolus administration of intravenous contrast. RADIATION DOSE REDUCTION: This exam was performed according to the departmental dose-optimization program which includes automated exposure control, adjustment of the mA and/or kV according to patient size and/or use of iterative reconstruction technique. CONTRAST:  OMNIPAQUE IOHEXOL 300 MG/ML  SOLN COMPARISON:  CT scan abdomen and pelvis from 05/24/2023. FINDINGS: Lower chest: The lung bases are clear. No pleural effusion. The heart is normal in size. No pericardial effusion. Hepatobiliary: The liver is normal in size. Non-cirrhotic configuration. No suspicious mass. No intrahepatic or extrahepatic bile duct dilation. Small-to-moderate volume dependent subcentimeter sized peripherally rim calcified gallstones noted without imaging signs of acute cholecystitis. Normal gallbladder wall thickness. No pericholecystic inflammatory changes. Pancreas: Unremarkable. No pancreatic ductal dilatation or surrounding inflammatory changes. Spleen: Within normal limits. No focal lesion. Adrenals/Urinary Tract: Adrenal glands are unremarkable. No suspicious renal mass. No hydronephrosis. no renal or ureteric calculi. Note is made of a partially exophytic 2.2 x 2.6 cm cyst  arising from the left kidney upper pole, medially. Urinary bladder is under distended, precluding optimal assessment. However, no large mass or stones identified. No perivesical fat stranding. Stomach/Bowel: No disproportionate dilation of the small or large bowel loops. No evidence of abnormal bowel wall thickening or inflammatory changes. The appendix was not visualized; however there is no acute inflammatory process in the right lower quadrant. There is adhesion of the terminal ileum with right side of the uterus, described in detail below under reproductive organs section. Vascular/Lymphatic: No ascites or pneumoperitoneum. Redemonstration of several soft tissue nodules in the anterior aspect of the upper abdomen with largest in the right supradiaphragmatic region measuring up to 9 x 10 mm. These nodules are essentially unchanged since the prior study dating back to 11/13/2021 and favored to represent benign pericardiac lymph nodes. No abdominal or pelvic lymphadenopathy, by size criteria. No aneurysmal dilation of the major abdominal arteries. There are mild peripheral atherosclerotic vascular calcifications of the aorta and its major branches. A stent noted in the left common iliac vein. Reproductive: Redemonstration of normal-sized anteverted uterus with heterogeneous fundus/upper uterine body. There is soft tissue along the right side of the fundus of the uterus which extends superiorly. There is similar type of adhesions/fibrosis between the uterus and terminal ileal loop. However, no associated bowel obstruction. No large adnexal mass seen. Other: There is a medium-sized right paramedian periumbilical hernia containing small loop of unobstructed distal ileum. There is also small fat containing supraumbilical midline ventral hernia. The soft tissues and abdominal wall are otherwise unremarkable. Musculoskeletal: No suspicious osseous lesions. There are mild multilevel degenerative changes in the visualized  spine. IMPRESSION: 1. No metastatic disease identified within the abdomen or pelvis. 2. Redemonstration of heterogeneous appearance of the uterus and adhesion between the right fundal region of the uterus and terminal ileum without associated bowel obstruction. Findings favor posttreatment changes. 3. Stable 4. Multiple other nonacute observations, as described above. Aortic Atherosclerosis (ICD10-I70.0). Electronically Signed   By: Jules Schick M.D.   On: 08/23/2023 08:45

## 2023-08-26 NOTE — Progress Notes (Signed)
Patient is taking Lynparza as prescribed.  She has not missed any doses and reports no side effects at this time.   

## 2023-08-26 NOTE — Patient Instructions (Addendum)
[facility] Discharge Instructions   You were seen and examined today by Dr. Katragadda.  He reviewed the results of your lab work which are normal/stable.   He reviewed the results of your CT scan which is stable.   We will proceed with your treatment today.   Return as scheduled.    Thank you for choosing [facility] at [facility] to provide your oncology and hematology care.  To afford each patient quality time with our provider, please arrive at least 15 minutes before your scheduled appointment time.   If you have a lab appointment with the Cancer Center please come in thru the Main Entrance and check in at the main information desk.  You need to re-schedule your appointment should you arrive 10 or more minutes late.  We strive to give you quality time with our providers, and arriving late affects you and other patients whose appointments are after yours.  Also, if you no show three or more times for appointments you may be dismissed from the clinic at the providers discretion.     Again, thank you for choosing [facility].  Our hope is that these requests will decrease the amount of time that you wait before being seen by our physicians.       _____________________________________________________________  Should you have questions after your visit to [facility], please contact our office at [phone] and follow the prompts.  Our office hours are 8:00 a.m. and 4:30 p.m. Monday - Friday.  Please note that voicemails left after 4:00 p.m. may not be returned until the following business day.  We are closed weekends and major holidays.  You do have access to a nurse 24-7, just call the main number to the clinic [phone] and do not press any options, hold on the line and a nurse will answer the phone.    For prescription refill requests, have your pharmacy contact our office and allow 72 hours.     Due to Covid, you will need to wear a mask upon entering the hospital. If you do not have a mask, a mask will be given to you at the Main Entrance upon arrival. For doctor visits, patients may have 1 support person age 18 or older with them. For treatment visits, patients can not have anyone with them due to social distancing guidelines and our immunocompromised population.      

## 2023-08-26 NOTE — Progress Notes (Signed)
 Patients port flushed without difficulty.  No blood return noted with no bruising or swelling noted at site.  Patient remains accessed for treatment.

## 2023-08-26 NOTE — Progress Notes (Signed)
 Labs reviewed with MD today. Will hold avastin today. may check next week (UA) if protein down to below 100, may give avastin per Dr. Ellin Saba.  Vitals stable and discharged home from clinic ambulatory. Follow up as scheduled.

## 2023-08-27 LAB — CA 125: Cancer Antigen (CA) 125: 72.7 U/mL — ABNORMAL HIGH (ref 0.0–38.1)

## 2023-08-28 ENCOUNTER — Other Ambulatory Visit: Payer: Self-pay

## 2023-09-02 ENCOUNTER — Inpatient Hospital Stay

## 2023-09-02 VITALS — BP 126/60 | HR 92 | Temp 97.3°F | Resp 18 | Wt 203.1 lb

## 2023-09-02 DIAGNOSIS — C482 Malignant neoplasm of peritoneum, unspecified: Secondary | ICD-10-CM

## 2023-09-02 DIAGNOSIS — Z5112 Encounter for antineoplastic immunotherapy: Secondary | ICD-10-CM | POA: Diagnosis not present

## 2023-09-02 DIAGNOSIS — C569 Malignant neoplasm of unspecified ovary: Secondary | ICD-10-CM

## 2023-09-02 LAB — URINALYSIS, DIPSTICK ONLY
Bilirubin Urine: NEGATIVE
Glucose, UA: NEGATIVE mg/dL
Hgb urine dipstick: NEGATIVE
Ketones, ur: NEGATIVE mg/dL
Nitrite: NEGATIVE
Protein, ur: 30 mg/dL — AB
Specific Gravity, Urine: 1.023 (ref 1.005–1.030)
pH: 5 (ref 5.0–8.0)

## 2023-09-02 LAB — COMPREHENSIVE METABOLIC PANEL WITH GFR
ALT: 13 U/L (ref 0–44)
AST: 22 U/L (ref 15–41)
Albumin: 3.5 g/dL (ref 3.5–5.0)
Alkaline Phosphatase: 55 U/L (ref 38–126)
Anion gap: 12 (ref 5–15)
BUN: 14 mg/dL (ref 6–20)
CO2: 23 mmol/L (ref 22–32)
Calcium: 9.3 mg/dL (ref 8.9–10.3)
Chloride: 102 mmol/L (ref 98–111)
Creatinine, Ser: 0.93 mg/dL (ref 0.44–1.00)
GFR, Estimated: >60 mL/min (ref >60)
Glucose, Bld: 157 mg/dL — ABNORMAL HIGH (ref 70–99)
Potassium: 3.9 mmol/L (ref 3.5–5.1)
Sodium: 137 mmol/L (ref 135–145)
Total Bilirubin: 1.7 mg/dL — ABNORMAL HIGH (ref 0.0–1.2)
Total Protein: 7.1 g/dL (ref 6.5–8.1)

## 2023-09-02 LAB — CBC WITH DIFFERENTIAL/PLATELET
Abs Immature Granulocytes: 0.03 10*3/uL (ref 0.00–0.07)
Basophils Absolute: 0 10*3/uL (ref 0.0–0.1)
Basophils Relative: 1 %
Eosinophils Absolute: 0.1 10*3/uL (ref 0.0–0.5)
Eosinophils Relative: 2 %
HCT: 35.9 % — ABNORMAL LOW (ref 36.0–46.0)
Hemoglobin: 12.4 g/dL (ref 12.0–15.0)
Immature Granulocytes: 0 %
Lymphocytes Relative: 24 %
Lymphs Abs: 1.7 10*3/uL (ref 0.7–4.0)
MCH: 39.6 pg — ABNORMAL HIGH (ref 26.0–34.0)
MCHC: 34.5 g/dL (ref 30.0–36.0)
MCV: 114.7 fL — ABNORMAL HIGH (ref 80.0–100.0)
Monocytes Absolute: 0.6 10*3/uL (ref 0.1–1.0)
Monocytes Relative: 8 %
Neutro Abs: 4.7 10*3/uL (ref 1.7–7.7)
Neutrophils Relative %: 65 %
Platelets: 171 10*3/uL (ref 150–400)
RBC: 3.13 MIL/uL — ABNORMAL LOW (ref 3.87–5.11)
RDW: 17.1 % — ABNORMAL HIGH (ref 11.5–15.5)
WBC: 7.1 10*3/uL (ref 4.0–10.5)
nRBC: 1.1 % — ABNORMAL HIGH (ref 0.0–0.2)

## 2023-09-02 LAB — MAGNESIUM: Magnesium: 1.8 mg/dL (ref 1.7–2.4)

## 2023-09-02 MED ORDER — SODIUM CHLORIDE 0.9 % IV SOLN: Freq: Once | INTRAVENOUS | Status: AC

## 2023-09-02 MED ORDER — HEPARIN SOD (PORK) LOCK FLUSH 100 UNIT/ML IV SOLN
500.0000 [IU] | Freq: Once | INTRAVENOUS | Status: AC | PRN
  Administered 2023-09-02: 500 [IU]

## 2023-09-02 MED ORDER — SODIUM CHLORIDE 0.9 % IV SOLN
INTRAVENOUS | Status: DC
Start: 2023-09-02 — End: 2023-09-02

## 2023-09-02 MED ORDER — SODIUM CHLORIDE 0.9 % IV SOLN
15.0000 mg/kg | Freq: Once | INTRAVENOUS | Status: AC
  Administered 2023-09-02: 1400 mg via INTRAVENOUS
  Filled 2023-09-02: qty 48

## 2023-09-02 NOTE — Progress Notes (Signed)
 Ok to tx with Tbili 1.7 per Dr. Ellin Saba.  Drusilla Kanner, PharmD, MBA

## 2023-09-02 NOTE — Patient Instructions (Signed)
 [facility] [place] - A DEPT OF [facility]  Discharge Instructions: Thank you for choosing [facility] to provide your oncology and hematology care.  If you have a lab appointment with the Cancer Center - please note that after April 8th, 2024, all labs will be drawn in the cancer center.  You do not have to check in or register with the main entrance as you have in the past but will complete your check-in in the cancer center.  Wear comfortable clothing and clothing appropriate for easy access to any Portacath or PICC line.   We strive to give you quality time with your provider. You may need to reschedule your appointment if you arrive late (15 or more minutes).  Arriving late affects you and other patients whose appointments are after yours.  Also, if you miss three or more appointments without notifying the office, you may be dismissed from the clinic at the provider's discretion.      For prescription refill requests, have your pharmacy contact our office and allow 72 hours for refills to be completed.    Today you received the following chemotherapy and/or immunotherapy agents mvasi    To help prevent nausea and vomiting after your treatment, we encourage you to take your nausea medication as directed.  BELOW ARE SYMPTOMS THAT SHOULD BE REPORTED IMMEDIATELY: *FEVER GREATER THAN 100.4 F (38 C) OR HIGHER *CHILLS OR SWEATING *NAUSEA AND VOMITING THAT IS NOT CONTROLLED WITH YOUR NAUSEA MEDICATION *UNUSUAL SHORTNESS OF BREATH *UNUSUAL BRUISING OR BLEEDING *URINARY PROBLEMS (pain or burning when urinating, or frequent urination) *BOWEL PROBLEMS (unusual diarrhea, constipation, pain near the anus) TENDERNESS IN MOUTH AND THROAT WITH OR WITHOUT PRESENCE OF ULCERS (sore throat, sores in mouth, or a toothache) UNUSUAL RASH, SWELLING OR PAIN  UNUSUAL VAGINAL DISCHARGE OR ITCHING   Items with * indicate a potential emergency and should be followed up as  soon as possible or go to the Emergency Department if any problems should occur.  Please show the CHEMOTHERAPY ALERT CARD or IMMUNOTHERAPY ALERT CARD at check-in to the Emergency Department and triage nurse.  Should you have questions after your visit or need to cancel or reschedule your appointment, please contact [facility] [place] - A DEPT OF Eligha Bridegroom [facility] [phone]  and follow the prompts.  Office hours are 8:00 a.m. to 4:30 p.m. Monday - Friday. Please note that voicemails left after 4:00 p.m. may not be returned until the following business day.  We are closed weekends and major holidays. You have access to a nurse at all times for urgent questions. Please call the main number to the clinic [phone] and follow the prompts.  For any non-urgent questions, you may also contact your provider using MyChart. We now offer e-Visits for anyone 86 and older to request care online for non-urgent symptoms. For details visit [email or web address].   Also download the MyChart app! Go to the app store, search "MyChart", open the app, select [place], and log in with your MyChart username and password.

## 2023-09-02 NOTE — Progress Notes (Signed)
 Pt unable to give a urine sample and states that she has not been feeling well since Saturday. MD and treatment team made aware. Per MD to give patient over 30 minutes.   Urine sample received and resulted- Urine protein at 30. MD and treatment team made aware. Per MD to proceed with treatment. Pt updated and agrees to plan.   Total bilirubin 1.7- per MD to proceed with treatment today.   Patient tolerated chemotherapy with no complaints voiced.  Side effects with management reviewed with understanding verbalized.  Port site clean and dry with no bruising or swelling noted at site.   Band aid applied.  Patient left in satisfactory condition with VSS and no s/s of distress noted. All follow ups as scheduled.   Corney Knighton [employer]

## 2023-09-03 LAB — CA 125: Cancer Antigen (CA) 125: 101 U/mL — ABNORMAL HIGH (ref 0.0–38.1)

## 2023-09-11 ENCOUNTER — Encounter: Payer: Self-pay | Admitting: Hematology

## 2023-09-16 ENCOUNTER — Inpatient Hospital Stay: Admitting: Hematology

## 2023-09-16 ENCOUNTER — Inpatient Hospital Stay

## 2023-09-23 ENCOUNTER — Inpatient Hospital Stay: Attending: Gynecologic Oncology

## 2023-09-23 ENCOUNTER — Inpatient Hospital Stay

## 2023-09-23 VITALS — BP 135/79 | HR 94 | Temp 98.0°F | Resp 18

## 2023-09-23 DIAGNOSIS — Z79620 Long term (current) use of immunosuppressive biologic: Secondary | ICD-10-CM | POA: Insufficient documentation

## 2023-09-23 DIAGNOSIS — C569 Malignant neoplasm of unspecified ovary: Secondary | ICD-10-CM

## 2023-09-23 DIAGNOSIS — C482 Malignant neoplasm of peritoneum, unspecified: Secondary | ICD-10-CM | POA: Insufficient documentation

## 2023-09-23 DIAGNOSIS — Z5112 Encounter for antineoplastic immunotherapy: Secondary | ICD-10-CM | POA: Diagnosis present

## 2023-09-23 LAB — CBC WITH DIFFERENTIAL/PLATELET
Abs Immature Granulocytes: 0.03 10*3/uL (ref 0.00–0.07)
Basophils Absolute: 0 10*3/uL (ref 0.0–0.1)
Basophils Relative: 1 %
Eosinophils Absolute: 0.1 10*3/uL (ref 0.0–0.5)
Eosinophils Relative: 2 %
HCT: 32.1 % — ABNORMAL LOW (ref 36.0–46.0)
Hemoglobin: 11 g/dL — ABNORMAL LOW (ref 12.0–15.0)
Immature Granulocytes: 1 %
Lymphocytes Relative: 18 %
Lymphs Abs: 1 10*3/uL (ref 0.7–4.0)
MCH: 39.6 pg — ABNORMAL HIGH (ref 26.0–34.0)
MCHC: 34.3 g/dL (ref 30.0–36.0)
MCV: 115.5 fL — ABNORMAL HIGH (ref 80.0–100.0)
Monocytes Absolute: 0.5 10*3/uL (ref 0.1–1.0)
Monocytes Relative: 8 %
Neutro Abs: 3.9 10*3/uL (ref 1.7–7.7)
Neutrophils Relative %: 70 %
Platelets: 169 10*3/uL (ref 150–400)
RBC: 2.78 MIL/uL — ABNORMAL LOW (ref 3.87–5.11)
RDW: 17.2 % — ABNORMAL HIGH (ref 11.5–15.5)
WBC: 5.5 10*3/uL (ref 4.0–10.5)
nRBC: 1.1 % — ABNORMAL HIGH (ref 0.0–0.2)

## 2023-09-23 LAB — COMPREHENSIVE METABOLIC PANEL WITH GFR
ALT: 14 U/L (ref 0–44)
AST: 21 U/L (ref 15–41)
Albumin: 3.4 g/dL — ABNORMAL LOW (ref 3.5–5.0)
Alkaline Phosphatase: 61 U/L (ref 38–126)
Anion gap: 10 (ref 5–15)
BUN: 14 mg/dL (ref 6–20)
CO2: 23 mmol/L (ref 22–32)
Calcium: 9 mg/dL (ref 8.9–10.3)
Chloride: 104 mmol/L (ref 98–111)
Creatinine, Ser: 0.87 mg/dL (ref 0.44–1.00)
GFR, Estimated: >60 mL/min (ref >60)
Glucose, Bld: 130 mg/dL — ABNORMAL HIGH (ref 70–99)
Potassium: 4.1 mmol/L (ref 3.5–5.1)
Sodium: 137 mmol/L (ref 135–145)
Total Bilirubin: 1.5 mg/dL — ABNORMAL HIGH (ref 0.0–1.2)
Total Protein: 6.6 g/dL (ref 6.5–8.1)

## 2023-09-23 LAB — MAGNESIUM: Magnesium: 1.8 mg/dL (ref 1.7–2.4)

## 2023-09-23 MED ORDER — HEPARIN SOD (PORK) LOCK FLUSH 100 UNIT/ML IV SOLN
500.0000 [IU] | Freq: Once | INTRAVENOUS | Status: AC | PRN
Start: 2023-09-23 — End: 2023-09-23
  Administered 2023-09-23: 500 [IU]

## 2023-09-23 MED ORDER — BEVACIZUMAB-AWWB CHEMO INJECTION 400 MG/16ML
15.0000 mg/kg | Freq: Once | INTRAVENOUS | Status: AC
  Administered 2023-09-23: 1400 mg via INTRAVENOUS
  Filled 2023-09-23: qty 48

## 2023-09-23 MED ORDER — SODIUM CHLORIDE 0.9% FLUSH
10.0000 mL | INTRAVENOUS | Status: DC | PRN
Start: 2023-09-23 — End: 2023-09-23
  Administered 2023-09-23: 10 mL

## 2023-09-23 MED ORDER — SODIUM CHLORIDE 0.9 % IV SOLN: Freq: Once | INTRAVENOUS | Status: AC

## 2023-09-23 NOTE — Progress Notes (Unsigned)
 Dye Study performed 05/13/2023. Scheduling notified moving forward to obtain labs from a peripheral stick per patient's request. Per dye study impression. Port a catheter is ready for use per port tip positioning for treatment, however unreliable source for blood draws.

## 2023-09-23 NOTE — Patient Instructions (Signed)
 [facility] [place] - A DEPT OF [place]  Discharge Instructions: Thank you for choosing [facility] to provide your oncology and hematology care.  If you have a lab appointment with the Cancer Center - please note that after April 8th, 2024, all labs will be drawn in the cancer center.  You do not have to check in or register with the main entrance as you have in the past but will complete your check-in in the cancer center.  Wear comfortable clothing and clothing appropriate for easy access to any Portacath or PICC line.   We strive to give you quality time with your provider. You may need to reschedule your appointment if you arrive late (15 or more minutes).  Arriving late affects you and other patients whose appointments are after yours.  Also, if you miss three or more appointments without notifying the office, you may be dismissed from the clinic at the provider's discretion.      For prescription refill requests, have your pharmacy contact our office and allow 72 hours for refills to be completed.    Today you received the following chemotherapy and/or immunotherapy agents MVASI    To help prevent nausea and vomiting after your treatment, we encourage you to take your nausea medication as directed.  Bevacizumab  Injection What is this medication? BEVACIZUMAB  (be va SIZ yoo mab) treats some types of cancer. It works by blocking a protein that causes cancer cells to grow and multiply. This helps to slow or stop the spread of cancer cells. It is a monoclonal antibody. This medicine may be used for other purposes; ask your health care provider or pharmacist if you have questions. COMMON BRAND NAME(S): Alymsys , Avastin , MVASI , Vegzalma, Zirabev  What should I tell my care team before I take this medication? They need to know if you have any of these conditions: Blood clots Coughing up blood Having or recent surgery Heart failure High blood  pressure History of a connection between 2 or more body parts that do not usually connect (fistula) History of a tear in your stomach or intestines Protein in your urine An unusual or allergic reaction to bevacizumab , other medications, foods, dyes, or preservatives Pregnant or trying to get pregnant Breast-feeding How should I use this medication? This medication is injected into a vein. It is given by your care team in a hospital or clinic setting. Talk to your care team the use of this medication in children. Special care may be needed. Overdosage: If you think you have taken too much of this medicine contact a poison control center or emergency room at once. NOTE: This medicine is only for you. Do not share this medicine with others. What if I miss a dose? Keep appointments for follow-up doses. It is important not to miss your dose. Call your care team if you are unable to keep an appointment. What may interact with this medication? Interactions are not expected. This list may not describe all possible interactions. Give your health care provider a list of all the medicines, herbs, non-prescription drugs, or dietary supplements you use. Also tell them if you smoke, drink alcohol, or use illegal drugs. Some items may interact with your medicine. What should I watch for while using this medication? Your condition will be monitored carefully while you are receiving this medication. You may need blood work while taking this medication. This medication may make you feel generally unwell. This is not uncommon as chemotherapy can  affect healthy cells as well as cancer cells. Report any side effects. Continue your course of treatment even though you feel ill unless your care team tells you to stop. This medication may increase your risk to bruise or bleed. Call your care team if you notice any unusual bleeding. Before having surgery, talk to your care team to make sure it is ok. This medication can  increase the risk of poor healing of your surgical site or wound. You will need to stop this medication for 28 days before surgery. After surgery, wait at least 28 days before restarting this medication. Make sure the surgical site or wound is healed enough before restarting this medication. Talk to your care team if questions. Talk to your care team if you may be pregnant. Serious birth defects can occur if you take this medication during pregnancy and for 6 months after the last dose. Contraception is recommended while taking this medication and for 6 months after the last dose. Your care team can help you find the option that works for you. Do not breastfeed while taking this medication and for 6 months after the last dose. This medication can cause infertility. Talk to your care team if you are concerned about your fertility. What side effects may I notice from receiving this medication? Side effects that you should report to your care team as soon as possible: Allergic reactions--skin rash, itching, hives, swelling of the face, lips, tongue, or throat Bleeding--bloody or black, tar-like stools, vomiting blood or brown material that looks like coffee grounds, red or dark brown urine, small red or purple spots on skin, unusual bruising or bleeding Blood clot--pain, swelling, or warmth in the leg, shortness of breath, chest pain Heart attack--pain or tightness in the chest, shoulders, arms, or jaw, nausea, shortness of breath, cold or clammy skin, feeling faint or lightheaded Heart failure--shortness of breath, swelling of the ankles, feet, or hands, sudden weight gain, unusual weakness or fatigue Increase in blood pressure Infection--fever, chills, cough, sore throat, wounds that don't heal, pain or trouble when passing urine, general feeling of discomfort or being unwell Infusion reactions--chest pain, shortness of breath or trouble breathing, feeling faint or lightheaded Kidney injury--decrease in  the amount of urine, swelling of the ankles, hands, or feet Stomach pain that is severe, does not go away, or gets worse Stroke--sudden numbness or weakness of the face, arm, or leg, trouble speaking, confusion, trouble walking, loss of balance or coordination, dizziness, severe headache, change in vision Sudden and severe headache, confusion, change in vision, seizures, which may be signs of posterior reversible encephalopathy syndrome (PRES) Side effects that usually do not require medical attention (report to your care team if they continue or are bothersome): Back pain Change in taste Diarrhea Dry skin Increased tears Nosebleed This list may not describe all possible side effects. Call your doctor for medical advice about side effects. You may report side effects to FDA at 1-800-FDA-1088. Where should I keep my medication? This medication is given in a hospital or clinic. It will not be stored at home. NOTE: This sheet is a summary. It may not cover all possible information. If you have questions about this medicine, talk to your doctor, pharmacist, or health care provider.  2024 Elsevier/Gold Standard (2021-10-06 00:00:00)  BELOW ARE SYMPTOMS THAT SHOULD BE REPORTED IMMEDIATELY: *FEVER GREATER THAN 100.4 F (38 C) OR HIGHER *CHILLS OR SWEATING *NAUSEA AND VOMITING THAT IS NOT CONTROLLED WITH YOUR NAUSEA MEDICATION *UNUSUAL SHORTNESS OF BREATH *UNUSUAL BRUISING  OR BLEEDING *URINARY PROBLEMS (pain or burning when urinating, or frequent urination) *BOWEL PROBLEMS (unusual diarrhea, constipation, pain near the anus) TENDERNESS IN MOUTH AND THROAT WITH OR WITHOUT PRESENCE OF ULCERS (sore throat, sores in mouth, or a toothache) UNUSUAL RASH, SWELLING OR PAIN  UNUSUAL VAGINAL DISCHARGE OR ITCHING   Items with * indicate a potential emergency and should be followed up as soon as possible or go to the Emergency Department if any problems should occur.  Please show the CHEMOTHERAPY ALERT  CARD or IMMUNOTHERAPY ALERT CARD at check-in to the Emergency Department and triage nurse.  Should you have questions after your visit or need to cancel or reschedule your appointment, please contact [facility] [place] - A DEPT OF [facility] [phone]  and follow the prompts.  Office hours are 8:00 a.m. to 4:30 p.m. Monday - Friday. Please note that voicemails left after 4:00 p.m. may not be returned until the following business day.  We are closed weekends and major holidays. You have access to a nurse at all times for urgent questions. Please call the main number to the clinic [phone] and follow the prompts.  For any non-urgent questions, you may also contact your provider using MyChart. We now offer e-Visits for anyone 96 and older to request care online for non-urgent symptoms. For details visit [email or web address].   Also download the MyChart app! Go to the app store, search "MyChart", open the app, select [place], and log in with your MyChart username and password.

## 2023-09-23 NOTE — Progress Notes (Signed)
 Patient presents today for MVASI  infusion.  Patient is in satisfactory condition with no new complaints voiced.  Vital signs are stable.  Labs reviewed and all labs are within treatment parameters.  We will proceed with treatment per MD orders.    Treatment given today per MD orders. Tolerated infusion without adverse affects. Vital signs stable. No complaints at this time. Discharged from clinic ambulatory in stable condition. Alert and oriented x 3. F/U with [facility] as scheduled.

## 2023-09-24 LAB — CA 125: Cancer Antigen (CA) 125: 137 U/mL — ABNORMAL HIGH (ref 0.0–38.1)

## 2023-09-27 ENCOUNTER — Ambulatory Visit: Payer: Commercial Managed Care - PPO | Admitting: Gynecologic Oncology

## 2023-10-07 ENCOUNTER — Inpatient Hospital Stay: Admitting: Hematology

## 2023-10-07 ENCOUNTER — Inpatient Hospital Stay

## 2023-10-14 ENCOUNTER — Inpatient Hospital Stay

## 2023-10-14 ENCOUNTER — Inpatient Hospital Stay: Attending: Gynecologic Oncology | Admitting: Hematology

## 2023-10-14 ENCOUNTER — Other Ambulatory Visit: Payer: Self-pay

## 2023-10-14 ENCOUNTER — Encounter: Payer: Self-pay | Admitting: Hematology

## 2023-10-14 VITALS — BP 127/60 | HR 95 | Temp 97.8°F | Resp 18

## 2023-10-14 VITALS — BP 129/73 | HR 109 | Temp 98.1°F | Resp 18 | Wt 205.5 lb

## 2023-10-14 DIAGNOSIS — Z79899 Other long term (current) drug therapy: Secondary | ICD-10-CM | POA: Diagnosis not present

## 2023-10-14 DIAGNOSIS — C482 Malignant neoplasm of peritoneum, unspecified: Secondary | ICD-10-CM

## 2023-10-14 DIAGNOSIS — Z5112 Encounter for antineoplastic immunotherapy: Secondary | ICD-10-CM | POA: Insufficient documentation

## 2023-10-14 DIAGNOSIS — C569 Malignant neoplasm of unspecified ovary: Secondary | ICD-10-CM | POA: Diagnosis not present

## 2023-10-14 LAB — CBC WITH DIFFERENTIAL/PLATELET
Abs Immature Granulocytes: 0.05 10*3/uL (ref 0.00–0.07)
Basophils Absolute: 0 10*3/uL (ref 0.0–0.1)
Basophils Relative: 1 %
Eosinophils Absolute: 0.1 10*3/uL (ref 0.0–0.5)
Eosinophils Relative: 1 %
HCT: 33 % — ABNORMAL LOW (ref 36.0–46.0)
Hemoglobin: 11.4 g/dL — ABNORMAL LOW (ref 12.0–15.0)
Immature Granulocytes: 1 %
Lymphocytes Relative: 14 %
Lymphs Abs: 1 10*3/uL (ref 0.7–4.0)
MCH: 40.4 pg — ABNORMAL HIGH (ref 26.0–34.0)
MCHC: 34.5 g/dL (ref 30.0–36.0)
MCV: 117 fL — ABNORMAL HIGH (ref 80.0–100.0)
Monocytes Absolute: 0.5 10*3/uL (ref 0.1–1.0)
Monocytes Relative: 7 %
Neutro Abs: 5.6 10*3/uL (ref 1.7–7.7)
Neutrophils Relative %: 76 %
Platelets: 144 10*3/uL — ABNORMAL LOW (ref 150–400)
RBC: 2.82 MIL/uL — ABNORMAL LOW (ref 3.87–5.11)
RDW: 17.4 % — ABNORMAL HIGH (ref 11.5–15.5)
WBC: 7.4 10*3/uL (ref 4.0–10.5)
nRBC: 0.8 % — ABNORMAL HIGH (ref 0.0–0.2)

## 2023-10-14 LAB — COMPREHENSIVE METABOLIC PANEL WITH GFR
ALT: 13 U/L (ref 0–44)
AST: 22 U/L (ref 15–41)
Albumin: 3.4 g/dL — ABNORMAL LOW (ref 3.5–5.0)
Alkaline Phosphatase: 57 U/L (ref 38–126)
Anion gap: 6 (ref 5–15)
BUN: 11 mg/dL (ref 6–20)
CO2: 27 mmol/L (ref 22–32)
Calcium: 8.9 mg/dL (ref 8.9–10.3)
Chloride: 104 mmol/L (ref 98–111)
Creatinine, Ser: 0.81 mg/dL (ref 0.44–1.00)
GFR, Estimated: >60 mL/min (ref >60)
Glucose, Bld: 135 mg/dL — ABNORMAL HIGH (ref 70–99)
Potassium: 3.7 mmol/L (ref 3.5–5.1)
Sodium: 137 mmol/L (ref 135–145)
Total Bilirubin: 1.6 mg/dL — ABNORMAL HIGH (ref 0.0–1.2)
Total Protein: 6.3 g/dL — ABNORMAL LOW (ref 6.5–8.1)

## 2023-10-14 LAB — URINALYSIS, DIPSTICK ONLY
Bilirubin Urine: NEGATIVE
Glucose, UA: NEGATIVE mg/dL
Hgb urine dipstick: NEGATIVE
Ketones, ur: NEGATIVE mg/dL
Nitrite: NEGATIVE
Protein, ur: 100 mg/dL — AB
Specific Gravity, Urine: 1.026 (ref 1.005–1.030)
pH: 5 (ref 5.0–8.0)

## 2023-10-14 LAB — MAGNESIUM: Magnesium: 1.6 mg/dL — ABNORMAL LOW (ref 1.7–2.4)

## 2023-10-14 MED ORDER — SODIUM CHLORIDE 0.9% FLUSH
10.0000 mL | INTRAVENOUS | Status: AC | PRN
Start: 2023-10-14 — End: ?
  Administered 2023-10-14: 10 mL

## 2023-10-14 MED ORDER — SODIUM CHLORIDE 0.9 % IV SOLN
15.0000 mg/kg | Freq: Once | INTRAVENOUS | Status: AC
  Administered 2023-10-14: 1400 mg via INTRAVENOUS
  Filled 2023-10-14: qty 48

## 2023-10-14 MED ORDER — MAGNESIUM SULFATE 2 GM/50ML IV SOLN
2.0000 g | Freq: Once | INTRAVENOUS | Status: AC
  Administered 2023-10-14: 2 g via INTRAVENOUS
  Filled 2023-10-14: qty 50

## 2023-10-14 MED ORDER — HEPARIN SOD (PORK) LOCK FLUSH 100 UNIT/ML IV SOLN
500.0000 [IU] | Freq: Once | INTRAVENOUS | Status: AC | PRN
  Administered 2023-10-14: 500 [IU]

## 2023-10-14 MED ORDER — SODIUM CHLORIDE 0.9 % IV SOLN: Freq: Once | INTRAVENOUS | Status: AC

## 2023-10-14 NOTE — Progress Notes (Unsigned)
 Patient presents today for MVASI  and follow up appointment with Dr. Cheree Cords. Labs and vital signs within parameters for treatment.   Message received from A.Alva Jewels RN / Dr. Katragadda to proceed with treatment. Magnesium  1.6 today. Standing orders followed. Patient will receive 2 grams of Magnesium  Sulfate.  Total bili 1.6. Message sent to Dr. Cheree Cords. Urine obtained and sent to lab. Heart rate 109.   Treatment given today per MD orders. Tolerated infusion without adverse affects. Vital signs stable. No complaints at this time. Discharged from clinic ambulatory in stable condition. Alert and oriented x 3. F/U with [facility] as scheduled.

## 2023-10-14 NOTE — Progress Notes (Signed)
 [facility] [address] [name] [address]    Clinic Day:  10/14/2023  Referring physician: Eliodoro Guerin, DO  Patient Care Team: Eliodoro Guerin, DO as PCP - General (Family Medicine) Hyland Mailman, MD as Referring Physician (Optometry) Paulett Boros, MD as Medical Oncologist (Medical Oncology) Gerhard Knuckles, RN as Oncology Nurse Navigator (Medical Oncology)   ASSESSMENT & PLAN:   Assessment: 1. High-grade serous carcinoma of peritoneum: - She had developed sharp left-sided abdominal pain since Mother's Day, occasional radiating to the back.  40 pound weight loss in the last 6 months. - Personal history of cervical cancer in 2000, status post chemo plus XRT at [facility].  She has slightly decreased hearing in the left ear since then. - US  abdomen (11/08/2021): Cholelithiasis with gallbladder wall thickening and sonographic Murphy sign.  Upper abdominal ascites. - Laparoscopy (11/13/2021): Starting of the omentum as well as the upper abdominal wall and diaphragm.  Liver was within normal limits.  6 L of ascites was evacuated.  Omental and peritoneal biopsies were sent.  Peritoneal implants throughout and a significant portion of omentum and on the small intestine. - CT CAP (11/13/2021): Ascites, extensive peritoneal thickening, nodularity and omental caking.  No evidence of primary malignancy in the chest, abdomen or pelvis.  Small left pleural effusion, nonspecific.  Cholelithiasis. - US  pelvis (11/14/2021): Uterus is grossly unremarkable.  Ovaries not visualized. - CA125 (11/13/2021): 1269 - Pathology: Peritoneum and omental biopsy consistent with high-grade serous carcinoma.  HER2 2+ by IHC and negative by FISH. - 4 cycles of carboplatin  and paclitaxel  from 11/21/2021 through 01/23/2022, cycle 9 on 08/29/2022. - 10/01/2022: Diagnostic laparoscopy, exploratory laparotomy, omentectomy - Pathology: Right pelvic sidewall excision-high-grade  serous carcinoma, omental resection-high-grade serous carcinoma with treatment effect present.  Small bowel implant excision was negative for carcinoma. - Germline mutation testing negative - Foundation 1 NGS (10/01/2022): LOH-18.9%, HRD +, MS-stable, TMB-low - Olaparib  300 mg twice daily and bevacizumab  maintenance started on 01/07/2023  2. Social/family history: - Lives at home with her husband.  Worked in Engineering geologist at [employer].  Non-smoker. - Maternal grandmother with bladder cancer.  Maternal great grandmother with breast cancer.    Plan: High-grade serous carcinoma of peritoneum: - She has diarrhea lasting up to 4 to 5 days after each bevacizumab  infusion which is stable.  Lomotil  helps. - CTAP (08/20/2023): No metastatic disease.  Redemonstration of heterogeneous appearance of the uterus in addition between the right fundal region of the uterus and terminal ileum without associated bowel obstruction. - She reported pressure in her abdomen since last week.  She had developed some pain in her abdomen last night which improved. - Reviewed labs today: Normal LFTs with bilirubin 1.6.  CBC grossly normal.  CA125 has increased to 137 from 101 on 09/02/2023. - She is tolerating Lynparza  without any problems.  Continue Lynparza  300 mg twice daily.  Continue with bevacizumab  today.  RTC 3 weeks for follow-up.  Will repeat a CT scan of the abdomen and pelvis prior to next visit. - She reports appointment with Dr. Orvil Bland in June.    2.  Peripheral neuropathy: - Continue Cymbalta  60 mg daily.  Neuropathy in the bottom of the feet, left more than right is stable.  3.  Hypomagnesemia: - Continue magnesium  3 times daily.  Magnesium  is slightly low at 1.6 today.  She will receive IV magnesium .  4.  Hypokalemia: - Continue K-Dur 20 mEq daily.  Potassium is normal.  5.  Subsegmental pulmonary embolism (Dx 03/06/2022): - Continue Eliquis  twice daily.  No bleeding issues reported.    Orders Placed This  Encounter  Procedures   CT ABDOMEN PELVIS W CONTRAST    Standing Status:   Future    Expected Date:   10/28/2023    Expiration Date:   10/13/2024    If indicated for the ordered procedure, I authorize the administration of contrast media per Radiology protocol:   Yes    Does the patient have a contrast media/X-ray dye allergy?:   No    Preferred imaging location?:   [facility]    If indicated for the ordered procedure, I authorize the administration of oral contrast media per Radiology protocol:   Yes   Urinalysis, dipstick only    Standing Status:   Future    Expected Date:   11/04/2023    Expiration Date:   11/03/2024   Urinalysis, dipstick only    Standing Status:   Future    Expected Date:   11/25/2023    Expiration Date:   11/24/2024      [name],acting as a scribe for Paulett Boros, MD.,have documented all relevant documentation on the behalf of Paulett Boros, MD,as directed by  Paulett Boros, MD while in the presence of Paulett Boros, MD.  I, Paulett Boros MD, have reviewed the above documentation for accuracy and completeness, and I agree with the above.    Paulett Boros, MD   5/12/20251:27 PM  CHIEF COMPLAINT:   Diagnosis: high-grade serous carcinoma of peritoneum    Cancer Staging  Primary peritoneal carcinomatosis [facility]) Staging form: Ovary, Fallopian Tube, and Primary Peritoneal Carcinoma, AJCC 8th Edition - Clinical stage from 11/17/2021: FIGO Stage IIIC (cT3c, cM0) - Unsigned    Prior Therapy: Carboplatin  and paclitaxel -- x 4 cycles 11/21/2021 - 01/23/2022, x5 cycles 06/05/22 - 08/29/22, x3 cycles 11/05/22 - 12/17/22   Current Therapy:  olaparib  and maintenance bevacizumab     HISTORY OF PRESENT ILLNESS:   Oncology History  Malignant neoplasm of ovary (HCC)  11/15/2021 Initial Diagnosis   Malignant neoplasm of ovary (HCC)   01/07/2023 -  Chemotherapy   Patient is on Treatment Plan : OVARIAN Bevacizumab  q21d      Primary peritoneal carcinomatosis (HCC)  11/13/2021 Imaging   CT A/P: 1. Trace ascites throughout the abdomen and pelvis, with extensive peritoneal thickening, nodularity, and omental caking. Findings are consistent with widespread peritoneal and omental metastatic disease. 2. No evidence of primary malignancy in the chest, abdomen, or pelvis, with specific attention to the most likely etiologies of peritoneal metastatic disease including pancreatic, ovarian, and endometrial malignancy. Although there is no obvious mass, pelvic ultrasound may be helpful to more closely assess the female reproductive organs. 3. Small left pleural effusion and associated atelectasis or consolidation, nonspecific although worrisome for malignant effusion. No overt pleural thickening or nodularity. 4. Scattered areas of nonspecific infectious or inflammatory alveolar ground-glass airspace opacity throughout the lungs. 5. Cholelithiasis without evidence of acute cholecystitis. 6. Nonobstructive right nephrolithiasis. 7. Postoperative findings of same day laparoscopy including minimal pneumoperitoneum.   Aortic Atherosclerosis (ICD10-I70.0).   11/13/2021 Surgery   Diagnostic laparoscopy with biopsy Dr. Larrie Po Findings: Significant bloody ascites, studding of the omentum as well as the upper abdominal wall and diaphragm.  Nodularity noted along the small intestine.  Omental disease inhibited view of the pelvis.   11/17/2021 Initial Diagnosis   Primary peritoneal carcinomatosis (HCC)   11/21/2021 - 01/25/2022 Chemotherapy   Patient is on  Treatment Plan : OVARIAN Carboplatin  (AUC 6) / Paclitaxel  (175) q21d x 6 cycles     11/21/2021 - 12/17/2022 Chemotherapy   Patient is on Treatment Plan : OVARIAN Carboplatin  (AUC 6) + Paclitaxel  (175)  + Mvasi  q21d X 6 Cycles     01/19/2022 Imaging   CT A/P: 1. Small amount of adherent thrombus or fibrin sheath around the catheter in the LEFT brachiocephalic vein. 2.  Small low-attenuation area in the inter lobar fissure region associated with medial segment of LEFT hepatic lobe, new from previous imaging, may represent developing "variable fat deposition" though given history of peritoneal disease would suggest attention on subsequent imaging. 3. Diminished thickening of mesenteric reflections, decreased ascites and decreased omental infiltration since previous imaging. 4. Focal concentric narrowing of the rectosigmoid junction is nonspecific but suspicious for neoplasm potentially related to serosal involvement in the pelvis in this patient with known peritoneal disease from ovarian cancer. Underlying colonic neoplasm would be difficult to exclude as well. Colonic assessment with endoscopy or comparison with recent endoscopy performed is suggested. 5. Cholelithiasis without evidence of acute cholecystitis. 6. 4 mm RIGHT lower pole renal calculus.   Aortic Atherosclerosis (ICD10-I70.0).   03/06/2022 Imaging   CT C/A/P: Chest CT:   Scattered subsegmental pulmonary emboli in the right lung.   Abdominal CT:   1. Mild right hydroureteronephrosis from a 4 mm UVJ calculus. 2. Unchanged omental disease. 3. Cholelithiasis and other chronic findings described above.   05/24/2022 Imaging   Increased peritoneal carcinomatosis and mild ascites.   New moderate distal small bowel obstruction, with transition point in the central pelvis adjacent to the uterine fundus, which may be due to peritoneal carcinomatosis or adhesion.   Cholelithiasis. No radiographic evidence of cholecystitis.   Aortic Atherosclerosis (ICD10-I70.0).   08/21/2022 Imaging   CT A/P: 1. Improved peritoneal carcinomatosis and mild ascites. 2. Prominent/mildly enlarged loops of small bowel throughout the abdomen with transition point in the pelvis, not significantly changed from prior and compatible with a least low-grade obstruction. 3. Cholelithiasis without findings of acute  cholecystitis.   10/01/2022 Surgery   Diagnostic laparoscopy, robotic assisted laparoscopic lysis of adhesions, exploratory laparotomy, omentectomy   Findings: On EUA, small uterus within minimal mobility. On intra-abdominal entry, omentum adherent to the anterior abdominal wall at the level of the transverse colon. Some adhesions noted between the anterior liver surface and the diaphragm on the right. Small miliary disease noted on bilateral diaphragms, small bowel, small bowel mesentery, abdominal and pelvic peritoneum. No ascites. Complete retroperitonealization of the pelvic organs. Multiple loops of small bowel were densely adherent to the uterine fundus without obvious obstruction. Terminal ileum and cecum also adherent along the right pelvic sidewall. Sigmoid colon densely adherent to a loop of ileum and to the lateral aspect of the retroperitonealized pelvis on the left. After opening the retroperitoneum bilaterally and transecting the round ligaments, the bladder flap was able to be partially developed anteriorly. This revealed dense adhesions of the sigmoid to the lateral uterus. Given prior discussions with the patient, I did not feel that proceeding with further attempt at lysis of adhesions (some mobilization of two loops of ileum adherent to the uterine fundus was performed) would be possible without significant risk of injury to multiple loops of small bowel and to the sigmoid colon. 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 On laparotomy, 8 x 4 cm omental cake noted. No disease visualized or palpated within the lesser sac. There was no palpable  disease along the liver edges or spleen. Stomach was normal in appearance. Some miliary disease palpated along the abdominal peritoneum. Numerous miliary disease noted along the small bowel and small bowel mesentery. Numerous prominent lymph nodes (1cm or less) within the stalk of the small bowel  mesentery.  R1 resection based on miliary disease (in the setting of incomplete pelvic evaluation).   10/01/2022 Pathology Results   A. Pelvic sidewall, right, excision - High-grade serous carcinoma   B. Omentum, resection - High-grade serous carcinoma - Treatment effect present - See comment   C. Small bowel, implant, excision - Infarcted epiploic appendage - Negative for carcinoma  Immunohistochemical stains were performed on block B1. The tumor cells are positive for WT1, compatible with tubo-ovarian or peritoneal origin. p53 demonstrates faint positivity in rare cells, favored to be most consistent with a null pattern.    11/05/2022 Genetic Testing   Negative genetic testing on the [facility] Multi-cancer + RNA panel.  The report date is November 05, 2022.  The Multi-Cancer + RNA Panel offered by [facility] includes sequencing and/or deletion/duplication analysis of the following 70 genes:  AIP*, ALK, APC*, ATM*, AXIN2*, BAP1*, BARD1*, BLM*, BMPR1A*, BRCA1*, BRCA2*, BRIP1*, CDC73*, CDH1*, CDK4, CDKN1B*, CDKN2A, CHEK2*, CTNNA1*, DICER1*, EPCAM (del/dup only), EGFR, FH*, FLCN*, GREM1 (promoter dup only), HOXB13, KIT, LZTR1, MAX*, MBD4, MEN1*, MET, MITF, MLH1*, MSH2*, MSH3*, MSH6*, MUTYH*, NF1*, NF2*, NTHL1*, PALB2*, PDGFRA, PMS2*, POLD1*, POLE*, POT1*, PRKAR1A*, PTCH1*, PTEN*, RAD51C*, RAD51D*, RB1*, RET, SDHA* (sequencing only), SDHAF2*, SDHB*, SDHC*, SDHD*, SMAD4*, SMARCA4*, SMARCB1*, SMARCE1*, STK11*, SUFU*, TMEM127*, TP53*, TSC1*, TSC2*, VHL*. RNA analysis is performed for * genes.    01/07/2023 -  Chemotherapy   Patient is on Treatment Plan : OVARIAN Bevacizumab  q21d        INTERVAL HISTORY:   Karena is a 53 y.o. female presenting to clinic today for follow up of high-grade serous carcinoma of peritoneum. She was last seen by me on 08/26/23.  Today, she states that she is doing well overall. Her appetite level is at 100%. Her energy level is at 40%.  PAST MEDICAL HISTORY:   Past Medical  History: Past Medical History:  Diagnosis Date   Acute pulmonary embolism (HCC) 03/06/2022   Acute respiratory disease due to COVID-19 virus 02/02/2019   Anxiety    Cancer (HCC)    Depression    Dyspnea    Dysrhythmia    Family history of bladder cancer    Family history of breast cancer    History of diabetes mellitus    Resolved in Sept 2023 with weight loss.   Hypertension    Ovarian cancer (HCC) 11/14/2021   Peritoneal carcinomatosis (HCC)    Pneumonia    Sleep apnea    doesn't use CPAP   Tachycardia     Surgical History: Past Surgical History:  Procedure Laterality Date   KNEE SURGERY Right    Arthroscopic   LAPAROSCOPIC ABDOMINAL EXPLORATION N/A 11/13/2021   Procedure: Exploratory laparoscopy;  Surgeon: Alanda Allegra, MD;  Location: [facility];  Service: General;  Laterality: N/A;   PORTACATH PLACEMENT Left 11/15/2021   Procedure: INSERTION PORT-A-CATH;  Surgeon: Alanda Allegra, MD;  Location: [facility];  Service: General;  Laterality: Left;   TONSILLECTOMY AND ADENOIDECTOMY      Social History: Social History   Socioeconomic History   Marital status: Married    Spouse name: Not on file   Number of children: Not on file   Years of education: Not on file   Highest education level:  Not on file  Occupational History   Occupation: Unemployed  Tobacco Use   Smoking status: Never   Smokeless tobacco: Never  Vaping Use   Vaping status: Never Used  Substance and Sexual Activity   Alcohol use: No   Drug use: No   Sexual activity: Not Currently  Other Topics Concern   Not on file  Social History Narrative   Not on file   Social Drivers of Health   Financial Resource Strain: Not on file  Food Insecurity: No Food Insecurity (03/06/2022)   Hunger Vital Sign    Worried About Running Out of Food in the Last Year: Never true    Ran Out of Food in the Last Year: Never true  Transportation Needs: No Transportation Needs (03/06/2022)   PRAPARE - Scientist, research (physical sciences) (Medical): No    Lack of Transportation (Non-Medical): No  Physical Activity: Not on file  Stress: Not on file  Social Connections: Not on file  Intimate Partner Violence: Not At Risk (03/06/2022)   Humiliation, Afraid, Rape, and Kick questionnaire    Fear of Current or Ex-Partner: No    Emotionally Abused: No    Physically Abused: No    Sexually Abused: No    Family History: Family History  Problem Relation Age of Onset   Hypertension Mother    Heart attack Paternal Uncle    Bladder Cancer Maternal Grandmother 59       non-smoker   Dementia Maternal Grandfather    Breast cancer Maternal Great-grandmother        MGF's mother   Colon cancer Neg Hx    Ovarian cancer Neg Hx    Endometrial cancer Neg Hx    Pancreatic cancer Neg Hx    Prostate cancer Neg Hx     Current Medications:  Current Outpatient Medications:    apixaban  (ELIQUIS ) 5 MG TABS tablet, Take 1 tablet (5 mg total) by mouth 2 (two) times daily., Disp: 180 tablet, Rfl: 3   atorvastatin  (LIPITOR) 20 MG tablet, Take 1 tablet (20 mg total) by mouth at bedtime., Disp: 90 tablet, Rfl: 3   diphenoxylate -atropine  (LOMOTIL ) 2.5-0.025 MG tablet, Take 2 tablets by mouth after the first watery bowel movement. May then take 1 pill by mouth after each watery bowel movement. Do not exceed 8 pills in a 24 hour period., Disp: 60 tablet, Rfl: 3   DULoxetine  (CYMBALTA ) 30 MG capsule, TAKE 1 CAPSULE BY MOUTH DAILY FOR 7 DAYS, THEN INCREASE TO 2 CAPSULES DAILY, Disp: 180 capsule, Rfl: 2   lidocaine -prilocaine  (EMLA ) cream, Apply a quarter sized amount to port a cath site and cover with plastic wrap 1 hour prior to infusion appointments, Disp: 30 g, Rfl: 3   LYNPARZA  150 MG tablet, TAKE 2 TABLETS TWICE A DAY. MAY TAKE WITH FOOD TO DECEASE NAUSEA AND VOMITING. (SWALLOW WHOLE), Disp: 360 tablet, Rfl: 1   magnesium  oxide (MAG-OX) 400 (240 Mg) MG tablet, TAKE 1 TABLET BY MOUTH TWICE A DAY, Disp: 180 tablet, Rfl: 1   Multiple  Vitamin (MULTIVITAMIN) capsule, Take 1 capsule by mouth daily., Disp: , Rfl:    Potassium 99 MG TABS, Take 99 mg by mouth at bedtime., Disp: , Rfl:  No current facility-administered medications for this visit.  Facility-Administered Medications Ordered in Other Visits:    0.9 %  sodium chloride  infusion, , Intravenous, Once, Misako Roeder, MD   bevacizumab -awwb (MVASI ) 1,400 mg in sodium chloride  0.9 % 100 mL chemo infusion, 15 mg/kg (  Treatment Plan Recorded), Intravenous, Once, Morayma Godown, MD   heparin  lock flush 100 unit/mL, 500 Units, Intravenous, Once, Paulett Boros, MD   heparin  lock flush 100 unit/mL, 500 Units, Intracatheter, Once PRN, Nastashia Gallo, MD   sodium chloride  flush (NS) 0.9 % injection 10 mL, 10 mL, Intravenous, PRN, Brinlee Gambrell, MD   sodium chloride  flush (NS) 0.9 % injection 10 mL, 10 mL, Intracatheter, PRN, Pooja Camuso, MD   Allergies: Allergies  Allergen Reactions   Augmentin  [Amoxicillin -Pot Clavulanate] Diarrhea and Nausea And Vomiting   Doxycycline     Abdominal cramps.   Topamax  [Topiramate ] Nausea And Vomiting    REVIEW OF SYSTEMS:   Review of Systems  Constitutional:  Negative for chills, fatigue and fever.  HENT:   Negative for lump/mass, mouth sores, nosebleeds, sore throat and trouble swallowing.   Eyes:  Negative for eye problems.  Respiratory:  Negative for cough and shortness of breath.   Cardiovascular:  Negative for chest pain, leg swelling and palpitations.  Gastrointestinal:  Positive for abdominal pain, diarrhea and nausea. Negative for constipation and vomiting.  Genitourinary:  Negative for bladder incontinence, difficulty urinating, dysuria, frequency, hematuria and nocturia.   Musculoskeletal:  Negative for arthralgias, back pain, flank pain, myalgias and neck pain.  Skin:  Negative for itching and rash.  Neurological:  Negative for dizziness, headaches and numbness.  Hematological:  Does  not bruise/bleed easily.  Psychiatric/Behavioral:  Negative for depression, sleep disturbance and suicidal ideas. The patient is not nervous/anxious.   All other systems reviewed and are negative.    VITALS:   Blood pressure 129/73, pulse (!) 109, temperature 98.1 F (36.7 C), temperature source Oral, resp. rate 18, weight 205 lb 7.5 oz (93.2 kg), SpO2 100%.  Wt Readings from Last 3 Encounters:  10/14/23 205 lb 7.5 oz (93.2 kg)  09/23/23 203 lb (92.1 kg)  09/02/23 203 lb 1.6 oz (92.1 kg)    Body mass index is 37.58 kg/m.  Performance status (ECOG): 1 - Symptomatic but completely ambulatory  PHYSICAL EXAM:   Physical Exam Vitals and nursing note reviewed. Exam conducted with a chaperone present.  Constitutional:      Appearance: Normal appearance.  Cardiovascular:     Rate and Rhythm: Normal rate and regular rhythm.     Pulses: Normal pulses.     Heart sounds: Normal heart sounds.  Pulmonary:     Effort: Pulmonary effort is normal.     Breath sounds: Normal breath sounds.  Abdominal:     Palpations: Abdomen is soft. There is no hepatomegaly, splenomegaly or mass.     Tenderness: There is no abdominal tenderness.  Musculoskeletal:     Right lower leg: No edema.     Left lower leg: No edema.  Lymphadenopathy:     Cervical: No cervical adenopathy.     Right cervical: No superficial, deep or posterior cervical adenopathy.    Left cervical: No superficial, deep or posterior cervical adenopathy.     Upper Body:     Right upper body: No supraclavicular or axillary adenopathy.     Left upper body: No supraclavicular or axillary adenopathy.  Neurological:     General: No focal deficit present.     Mental Status: She is alert and oriented to person, place, and time.  Psychiatric:        Mood and Affect: Mood normal.        Behavior: Behavior normal.     LABS:   CBC     Component Value  Date/Time   WBC 7.4 10/14/2023 1145   RBC 2.82 (L) 10/14/2023 1145   HGB 11.4 (L)  10/14/2023 1145   HGB 9.0 (L) 02/08/2022 1459   HCT 33.0 (L) 10/14/2023 1145   HCT 27.0 (L) 02/08/2022 1459   PLT 144 (L) 10/14/2023 1145   PLT 79 (LL) 02/08/2022 1459   MCV 117.0 (H) 10/14/2023 1145   MCV 88 02/08/2022 1459   MCH 40.4 (H) 10/14/2023 1145   MCHC 34.5 10/14/2023 1145   RDW 17.4 (H) 10/14/2023 1145   RDW 23.4 (H) 02/08/2022 1459   LYMPHSABS 1.0 10/14/2023 1145   LYMPHSABS 1.2 02/08/2022 1459   MONOABS 0.5 10/14/2023 1145   EOSABS 0.1 10/14/2023 1145   EOSABS 0.1 02/08/2022 1459   BASOSABS 0.0 10/14/2023 1145   BASOSABS 0.0 02/08/2022 1459    CMP      Component Value Date/Time   NA 137 10/14/2023 1145   NA 136 03/01/2022 1242   K 3.7 10/14/2023 1145   CL 104 10/14/2023 1145   CO2 27 10/14/2023 1145   GLUCOSE 135 (H) 10/14/2023 1145   BUN 11 10/14/2023 1145   BUN 5 (L) 03/01/2022 1242   CREATININE 0.81 10/14/2023 1145   CALCIUM  8.9 10/14/2023 1145   PROT 6.3 (L) 10/14/2023 1145   PROT 6.7 03/01/2022 1242   ALBUMIN 3.4 (L) 10/14/2023 1145   ALBUMIN 3.6 (L) 03/01/2022 1242   AST 22 10/14/2023 1145   ALT 13 10/14/2023 1145   ALKPHOS 57 10/14/2023 1145   BILITOT 1.6 (H) 10/14/2023 1145   BILITOT 1.0 03/01/2022 1242   GFRNONAA >60 10/14/2023 1145   GFRAA 103 05/31/2020 1033     Lab Results  Component Value Date   CEA1 1.1 11/13/2021   /  CEA  Date Value Ref Range Status  11/13/2021 1.1 0.0 - 4.7 ng/mL Final    Comment:    (NOTE)                             Nonsmokers          <3.9                             Smokers             <5.6 [employer] Electrochemiluminescence Immunoassay (ECLIA) Values obtained with different assay methods or kits cannot be used interchangeably.  Results cannot be interpreted as absolute evidence of the presence or absence of malignant disease. Performed At: [facility] [address] [place], [name] [id number] Pearlean Botts MD WU:[id number]    No results found for: "PSA1" Lab Results   Component Value Date   CAN199 6 11/13/2021   Lab Results  Component Value Date   CAN125 137.0 (H) 09/23/2023    No results found for: "TOTALPROTELP", "ALBUMINELP", "A1GS", "A2GS", "BETS", "BETA2SER", "GAMS", "MSPIKE", "SPEI" Lab Results  Component Value Date   FERRITIN 356 (H) 02/07/2019   FERRITIN 311 (H) 02/06/2019   FERRITIN 343 (H) 02/05/2019   Lab Results  Component Value Date   LDH 259 (H) 02/02/2019     STUDIES:   No results found.

## 2023-10-14 NOTE — Patient Instructions (Signed)

## 2023-10-14 NOTE — Patient Instructions (Signed)
 [facility] [place] - A DEPT OF [facility]  Discharge Instructions: Thank you for choosing [facility] to provide your oncology and hematology care.  If you have a lab appointment with the Cancer Center - please note that after April 8th, 2024, all labs will be drawn in the cancer center.  You do not have to check in or register with the main entrance as you have in the past but will complete your check-in in the cancer center.  Wear comfortable clothing and clothing appropriate for easy access to any Portacath or PICC line.   We strive to give you quality time with your provider. You may need to reschedule your appointment if you arrive late (15 or more minutes).  Arriving late affects you and other patients whose appointments are after yours.  Also, if you miss three or more appointments without notifying the office, you may be dismissed from the clinic at the provider's discretion.      For prescription refill requests, have your pharmacy contact our office and allow 72 hours for refills to be completed.    Today you received the following chemotherapy and/or immunotherapy agents mvasi . Bevacizumab  Injection What is this medication? BEVACIZUMAB  (be va SIZ yoo mab) treats some types of cancer. It works by blocking a protein that causes cancer cells to grow and multiply. This helps to slow or stop the spread of cancer cells. It is a monoclonal antibody. This medicine may be used for other purposes; ask your health care provider or pharmacist if you have questions. COMMON BRAND NAME(S): Alymsys , Avastin , MVASI , Vegzalma, Zirabev  What should I tell my care team before I take this medication? They need to know if you have any of these conditions: Blood clots Coughing up blood Having or recent surgery Heart failure High blood pressure History of a connection between 2 or more body parts that do not usually connect (fistula) History of a tear in your  stomach or intestines Protein in your urine An unusual or allergic reaction to bevacizumab , other medications, foods, dyes, or preservatives Pregnant or trying to get pregnant Breast-feeding How should I use this medication? This medication is injected into a vein. It is given by your care team in a hospital or clinic setting. Talk to your care team the use of this medication in children. Special care may be needed. Overdosage: If you think you have taken too much of this medicine contact a poison control center or emergency room at once. NOTE: This medicine is only for you. Do not share this medicine with others. What if I miss a dose? Keep appointments for follow-up doses. It is important not to miss your dose. Call your care team if you are unable to keep an appointment. What may interact with this medication? Interactions are not expected. This list may not describe all possible interactions. Give your health care provider a list of all the medicines, herbs, non-prescription drugs, or dietary supplements you use. Also tell them if you smoke, drink alcohol, or use illegal drugs. Some items may interact with your medicine. What should I watch for while using this medication? Your condition will be monitored carefully while you are receiving this medication. You may need blood work while taking this medication. This medication may make you feel generally unwell. This is not uncommon as chemotherapy can affect healthy cells as well as cancer cells. Report any side effects. Continue your course of treatment even though you feel ill  unless your care team tells you to stop. This medication may increase your risk to bruise or bleed. Call your care team if you notice any unusual bleeding. Before having surgery, talk to your care team to make sure it is ok. This medication can increase the risk of poor healing of your surgical site or wound. You will need to stop this medication for 28 days before  surgery. After surgery, wait at least 28 days before restarting this medication. Make sure the surgical site or wound is healed enough before restarting this medication. Talk to your care team if questions. Talk to your care team if you may be pregnant. Serious birth defects can occur if you take this medication during pregnancy and for 6 months after the last dose. Contraception is recommended while taking this medication and for 6 months after the last dose. Your care team can help you find the option that works for you. Do not breastfeed while taking this medication and for 6 months after the last dose. This medication can cause infertility. Talk to your care team if you are concerned about your fertility. What side effects may I notice from receiving this medication? Side effects that you should report to your care team as soon as possible: Allergic reactions--skin rash, itching, hives, swelling of the face, lips, tongue, or throat Bleeding--bloody or black, tar-like stools, vomiting blood or brown material that looks like coffee grounds, red or dark brown urine, small red or purple spots on skin, unusual bruising or bleeding Blood clot--pain, swelling, or warmth in the leg, shortness of breath, chest pain Heart attack--pain or tightness in the chest, shoulders, arms, or jaw, nausea, shortness of breath, cold or clammy skin, feeling faint or lightheaded Heart failure--shortness of breath, swelling of the ankles, feet, or hands, sudden weight gain, unusual weakness or fatigue Increase in blood pressure Infection--fever, chills, cough, sore throat, wounds that don't heal, pain or trouble when passing urine, general feeling of discomfort or being unwell Infusion reactions--chest pain, shortness of breath or trouble breathing, feeling faint or lightheaded Kidney injury--decrease in the amount of urine, swelling of the ankles, hands, or feet Stomach pain that is severe, does not go away, or gets  worse Stroke--sudden numbness or weakness of the face, arm, or leg, trouble speaking, confusion, trouble walking, loss of balance or coordination, dizziness, severe headache, change in vision Sudden and severe headache, confusion, change in vision, seizures, which may be signs of posterior reversible encephalopathy syndrome (PRES) Side effects that usually do not require medical attention (report to your care team if they continue or are bothersome): Back pain Change in taste Diarrhea Dry skin Increased tears Nosebleed This list may not describe all possible side effects. Call your doctor for medical advice about side effects. You may report side effects to FDA at 1-800-FDA-1088. Where should I keep my medication? This medication is given in a hospital or clinic. It will not be stored at home. NOTE: This sheet is a summary. It may not cover all possible information. If you have questions about this medicine, talk to your doctor, pharmacist, or health care provider.  2024 Elsevier/Gold Standard (2021-10-06 00:00:00)      To help prevent nausea and vomiting after your treatment, we encourage you to take your nausea medication as directed.  BELOW ARE SYMPTOMS THAT SHOULD BE REPORTED IMMEDIATELY: *FEVER GREATER THAN 100.4 F (38 C) OR HIGHER *CHILLS OR SWEATING *NAUSEA AND VOMITING THAT IS NOT CONTROLLED WITH YOUR NAUSEA MEDICATION *UNUSUAL SHORTNESS OF BREATH *  UNUSUAL BRUISING OR BLEEDING *URINARY PROBLEMS (pain or burning when urinating, or frequent urination) *BOWEL PROBLEMS (unusual diarrhea, constipation, pain near the anus) TENDERNESS IN MOUTH AND THROAT WITH OR WITHOUT PRESENCE OF ULCERS (sore throat, sores in mouth, or a toothache) UNUSUAL RASH, SWELLING OR PAIN  UNUSUAL VAGINAL DISCHARGE OR ITCHING   Items with * indicate a potential emergency and should be followed up as soon as possible or go to the Emergency Department if any problems should occur.  Please show the  CHEMOTHERAPY ALERT CARD or IMMUNOTHERAPY ALERT CARD at check-in to the Emergency Department and triage nurse.  Should you have questions after your visit or need to cancel or reschedule your appointment, please contact [facility] [place] - A DEPT OF [facility] [phone]  and follow the prompts.  Office hours are 8:00 a.m. to 4:30 p.m. Monday - Friday. Please note that voicemails left after 4:00 p.m. may not be returned until the following business day.  We are closed weekends and major holidays. You have access to a nurse at all times for urgent questions. Please call the main number to the clinic [phone] and follow the prompts.  For any non-urgent questions, you may also contact your provider using MyChart. We now offer e-Visits for anyone 52 and older to request care online for non-urgent symptoms. For details visit [email or web address].   Also download the MyChart app! Go to the app store, search "MyChart", open the app, select [facility], and log in with your MyChart username and password.

## 2023-10-14 NOTE — Progress Notes (Signed)
 Patient has been examined by Dr. Cheree Cords. Vital signs (HR 109) and labs have been reviewed by MD - ANC, Creatinine, LFTs (total bilirubin 1.6), hemoglobin, and platelets are within treatment parameters per M.D. - pt may proceed with treatment.  Primary RN and pharmacy notified.

## 2023-10-15 LAB — CA 125: Cancer Antigen (CA) 125: 173 U/mL — ABNORMAL HIGH (ref 0.0–38.1)

## 2023-10-24 ENCOUNTER — Ambulatory Visit (HOSPITAL_COMMUNITY)
Admission: RE | Admit: 2023-10-24 | Discharge: 2023-10-24 | Disposition: A | Discharge status: Home / Self Care | Source: Ambulatory Visit | Attending: Hematology | Admitting: Hematology

## 2023-10-24 DIAGNOSIS — C482 Malignant neoplasm of peritoneum, unspecified: Secondary | ICD-10-CM | POA: Diagnosis present

## 2023-10-24 MED ORDER — IOHEXOL 9 MG/ML PO SOLN: 500.0000 mL | ORAL | Status: AC

## 2023-10-24 MED ORDER — IOHEXOL 300 MG/ML  SOLN
100.0000 mL | Freq: Once | INTRAMUSCULAR | Status: AC | PRN
  Administered 2023-10-24: 100 mL via INTRAVENOUS

## 2023-10-24 MED ORDER — IOHEXOL 9 MG/ML PO SOLN
500.0000 mL | ORAL | Status: AC
  Administered 2023-10-24: 500 mL via ORAL

## 2023-11-04 ENCOUNTER — Inpatient Hospital Stay (HOSPITAL_COMMUNITY)

## 2023-11-04 ENCOUNTER — Encounter (HOSPITAL_COMMUNITY): Payer: Self-pay | Admitting: Emergency Medicine

## 2023-11-04 ENCOUNTER — Emergency Department (HOSPITAL_COMMUNITY)

## 2023-11-04 ENCOUNTER — Inpatient Hospital Stay (HOSPITAL_COMMUNITY)
Admission: EM | Admit: 2023-11-04 | Discharge: 2023-11-07 | DRG: 389 | Disposition: A | Discharge status: Home / Self Care | Attending: Family Medicine | Admitting: Family Medicine

## 2023-11-04 ENCOUNTER — Other Ambulatory Visit: Payer: Self-pay

## 2023-11-04 DIAGNOSIS — Z79899 Other long term (current) drug therapy: Secondary | ICD-10-CM

## 2023-11-04 DIAGNOSIS — K625 Hemorrhage of anus and rectum: Secondary | ICD-10-CM | POA: Diagnosis not present

## 2023-11-04 DIAGNOSIS — Z515 Encounter for palliative care: Secondary | ICD-10-CM | POA: Diagnosis not present

## 2023-11-04 DIAGNOSIS — K5651 Intestinal adhesions [bands], with partial obstruction: Secondary | ICD-10-CM | POA: Diagnosis present

## 2023-11-04 DIAGNOSIS — Z9221 Personal history of antineoplastic chemotherapy: Secondary | ICD-10-CM | POA: Diagnosis not present

## 2023-11-04 DIAGNOSIS — Z888 Allergy status to other drugs, medicaments and biological substances status: Secondary | ICD-10-CM

## 2023-11-04 DIAGNOSIS — Z8616 Personal history of COVID-19: Secondary | ICD-10-CM | POA: Diagnosis not present

## 2023-11-04 DIAGNOSIS — E782 Mixed hyperlipidemia: Secondary | ICD-10-CM | POA: Diagnosis present

## 2023-11-04 DIAGNOSIS — F419 Anxiety disorder, unspecified: Secondary | ICD-10-CM | POA: Diagnosis present

## 2023-11-04 DIAGNOSIS — D62 Acute posthemorrhagic anemia: Secondary | ICD-10-CM | POA: Diagnosis not present

## 2023-11-04 DIAGNOSIS — E1142 Type 2 diabetes mellitus with diabetic polyneuropathy: Secondary | ICD-10-CM | POA: Diagnosis present

## 2023-11-04 DIAGNOSIS — D539 Nutritional anemia, unspecified: Secondary | ICD-10-CM | POA: Insufficient documentation

## 2023-11-04 DIAGNOSIS — I2699 Other pulmonary embolism without acute cor pulmonale: Secondary | ICD-10-CM | POA: Diagnosis present

## 2023-11-04 DIAGNOSIS — R7303 Prediabetes: Secondary | ICD-10-CM | POA: Insufficient documentation

## 2023-11-04 DIAGNOSIS — C786 Secondary malignant neoplasm of retroperitoneum and peritoneum: Secondary | ICD-10-CM | POA: Diagnosis present

## 2023-11-04 DIAGNOSIS — Z7982 Long term (current) use of aspirin: Secondary | ICD-10-CM | POA: Diagnosis not present

## 2023-11-04 DIAGNOSIS — Z880 Allergy status to penicillin: Secondary | ICD-10-CM | POA: Diagnosis not present

## 2023-11-04 DIAGNOSIS — E66812 Obesity, class 2: Secondary | ICD-10-CM | POA: Diagnosis present

## 2023-11-04 DIAGNOSIS — K921 Melena: Secondary | ICD-10-CM | POA: Diagnosis present

## 2023-11-04 DIAGNOSIS — R109 Unspecified abdominal pain: Secondary | ICD-10-CM | POA: Insufficient documentation

## 2023-11-04 DIAGNOSIS — G6289 Other specified polyneuropathies: Secondary | ICD-10-CM | POA: Diagnosis not present

## 2023-11-04 DIAGNOSIS — Z8249 Family history of ischemic heart disease and other diseases of the circulatory system: Secondary | ICD-10-CM

## 2023-11-04 DIAGNOSIS — E1165 Type 2 diabetes mellitus with hyperglycemia: Secondary | ICD-10-CM | POA: Diagnosis present

## 2023-11-04 DIAGNOSIS — Z7901 Long term (current) use of anticoagulants: Secondary | ICD-10-CM | POA: Diagnosis not present

## 2023-11-04 DIAGNOSIS — R739 Hyperglycemia, unspecified: Secondary | ICD-10-CM | POA: Diagnosis not present

## 2023-11-04 DIAGNOSIS — Z86711 Personal history of pulmonary embolism: Secondary | ICD-10-CM

## 2023-11-04 DIAGNOSIS — K56609 Unspecified intestinal obstruction, unspecified as to partial versus complete obstruction: Secondary | ICD-10-CM | POA: Diagnosis present

## 2023-11-04 DIAGNOSIS — Z7189 Other specified counseling: Secondary | ICD-10-CM | POA: Diagnosis not present

## 2023-11-04 DIAGNOSIS — Z6837 Body mass index (BMI) 37.0-37.9, adult: Secondary | ICD-10-CM | POA: Diagnosis not present

## 2023-11-04 DIAGNOSIS — C569 Malignant neoplasm of unspecified ovary: Secondary | ICD-10-CM | POA: Diagnosis not present

## 2023-11-04 DIAGNOSIS — I10 Essential (primary) hypertension: Secondary | ICD-10-CM | POA: Diagnosis present

## 2023-11-04 DIAGNOSIS — R1084 Generalized abdominal pain: Secondary | ICD-10-CM

## 2023-11-04 DIAGNOSIS — Z8543 Personal history of malignant neoplasm of ovary: Secondary | ICD-10-CM

## 2023-11-04 DIAGNOSIS — K439 Ventral hernia without obstruction or gangrene: Secondary | ICD-10-CM | POA: Diagnosis present

## 2023-11-04 DIAGNOSIS — Z803 Family history of malignant neoplasm of breast: Secondary | ICD-10-CM

## 2023-11-04 DIAGNOSIS — K529 Noninfective gastroenteritis and colitis, unspecified: Secondary | ICD-10-CM | POA: Diagnosis not present

## 2023-11-04 DIAGNOSIS — G629 Polyneuropathy, unspecified: Secondary | ICD-10-CM

## 2023-11-04 DIAGNOSIS — I2782 Chronic pulmonary embolism: Secondary | ICD-10-CM | POA: Diagnosis not present

## 2023-11-04 DIAGNOSIS — Z560 Unemployment, unspecified: Secondary | ICD-10-CM

## 2023-11-04 DIAGNOSIS — Z8052 Family history of malignant neoplasm of bladder: Secondary | ICD-10-CM

## 2023-11-04 LAB — HEMOGLOBIN A1C
Hgb A1c MFr Bld: 5.3 % (ref 4.8–5.6)
Mean Plasma Glucose: 105.41 mg/dL

## 2023-11-04 LAB — GLUCOSE, CAPILLARY
Glucose-Capillary: 106 mg/dL — ABNORMAL HIGH (ref 70–99)
Glucose-Capillary: 90 mg/dL (ref 70–99)
Glucose-Capillary: 96 mg/dL (ref 70–99)

## 2023-11-04 LAB — CBG MONITORING, ED
Glucose-Capillary: 82 mg/dL (ref 70–99)
Glucose-Capillary: 105 mg/dL — ABNORMAL HIGH (ref 70–99)

## 2023-11-04 LAB — COMPREHENSIVE METABOLIC PANEL WITH GFR
ALT: 13 U/L (ref 0–44)
AST: 25 U/L (ref 15–41)
Albumin: 3.7 g/dL (ref 3.5–5.0)
Alkaline Phosphatase: 58 U/L (ref 38–126)
Anion gap: 15 (ref 5–15)
BUN: 15 mg/dL (ref 6–20)
CO2: 20 mmol/L — ABNORMAL LOW (ref 22–32)
Calcium: 9.6 mg/dL (ref 8.9–10.3)
Chloride: 103 mmol/L (ref 98–111)
Creatinine, Ser: 0.98 mg/dL (ref 0.44–1.00)
GFR, Estimated: >60 mL/min (ref >60)
Glucose, Bld: 194 mg/dL — ABNORMAL HIGH (ref 70–99)
Potassium: 3.5 mmol/L (ref 3.5–5.1)
Sodium: 138 mmol/L (ref 135–145)
Total Bilirubin: 1.9 mg/dL — ABNORMAL HIGH (ref 0.0–1.2)
Total Protein: 7 g/dL (ref 6.5–8.1)

## 2023-11-04 LAB — URINALYSIS, ROUTINE W REFLEX MICROSCOPIC
Bacteria, UA: NONE SEEN
Bilirubin Urine: NEGATIVE
Glucose, UA: NEGATIVE mg/dL
Hgb urine dipstick: NEGATIVE
Ketones, ur: 5 mg/dL — AB
Nitrite: NEGATIVE
Protein, ur: 30 mg/dL — AB
pH: 8 (ref 5.0–8.0)

## 2023-11-04 LAB — CBC
HCT: 33.1 % — ABNORMAL LOW (ref 36.0–46.0)
Hemoglobin: 11.6 g/dL — ABNORMAL LOW (ref 12.0–15.0)
MCH: 40.3 pg — ABNORMAL HIGH (ref 26.0–34.0)
MCHC: 35 g/dL (ref 30.0–36.0)
MCV: 114.9 fL — ABNORMAL HIGH (ref 80.0–100.0)
Platelets: 133 10*3/uL — ABNORMAL LOW (ref 150–400)
RBC: 2.88 MIL/uL — ABNORMAL LOW (ref 3.87–5.11)
RDW: 17.5 % — ABNORMAL HIGH (ref 11.5–15.5)
WBC: 10.6 10*3/uL — ABNORMAL HIGH (ref 4.0–10.5)
nRBC: 1.4 % — ABNORMAL HIGH (ref 0.0–0.2)

## 2023-11-04 LAB — FOLATE: Folate: 17 ng/mL (ref >5.9)

## 2023-11-04 LAB — MAGNESIUM: Magnesium: 1.5 mg/dL — ABNORMAL LOW (ref 1.7–2.4)

## 2023-11-04 LAB — LIPASE, BLOOD: Lipase: 26 U/L (ref 11–51)

## 2023-11-04 LAB — VITAMIN B12: Vitamin B-12: 189 pg/mL (ref 180–914)

## 2023-11-04 MED ORDER — ACETAMINOPHEN 325 MG PO TABS: 650.0000 mg | ORAL_TABLET | Freq: Four times a day (QID) | ORAL | Status: DC | PRN

## 2023-11-04 MED ORDER — ONDANSETRON HCL 4 MG PO TABS: 4.0000 mg | ORAL_TABLET | Freq: Four times a day (QID) | ORAL | Status: DC | PRN

## 2023-11-04 MED ORDER — LACTATED RINGERS IV SOLN: INTRAVENOUS | Status: DC

## 2023-11-04 MED ORDER — ONDANSETRON HCL 4 MG/2ML IJ SOLN: 4.0000 mg | Freq: Four times a day (QID) | INTRAMUSCULAR | Status: DC | PRN

## 2023-11-04 MED ORDER — IOHEXOL 300 MG/ML  SOLN
100.0000 mL | Freq: Once | INTRAMUSCULAR | Status: AC | PRN
  Administered 2023-11-04: 100 mL via INTRAVENOUS

## 2023-11-04 MED ORDER — INSULIN ASPART 100 UNIT/ML IJ SOLN: 0.0000 [IU] | INTRAMUSCULAR | Status: DC

## 2023-11-04 MED ORDER — ENOXAPARIN SODIUM 100 MG/ML IJ SOSY
95.0000 mg | PREFILLED_SYRINGE | Freq: Two times a day (BID) | INTRAMUSCULAR | Status: DC
  Administered 2023-11-04 (×2): 95 mg via SUBCUTANEOUS
  Filled 2023-11-04 (×3): qty 1

## 2023-11-04 MED ORDER — LIDOCAINE HCL URETHRAL/MUCOSAL 2 % EX GEL
1.0000 | Freq: Once | CUTANEOUS | Status: AC
  Administered 2023-11-04: 1
  Filled 2023-11-04: qty 20

## 2023-11-04 MED ORDER — LACTATED RINGERS IV SOLN: INTRAVENOUS | Status: AC

## 2023-11-04 MED ORDER — MORPHINE SULFATE (PF) 4 MG/ML IV SOLN
4.0000 mg | Freq: Once | INTRAVENOUS | Status: AC
  Administered 2023-11-04: 4 mg via INTRAVENOUS
  Filled 2023-11-04: qty 1

## 2023-11-04 MED ORDER — MORPHINE SULFATE (PF) 2 MG/ML IV SOLN
2.0000 mg | INTRAVENOUS | Status: DC | PRN
  Administered 2023-11-04 (×2): 2 mg via INTRAVENOUS
  Filled 2023-11-04 (×2): qty 1

## 2023-11-04 MED ORDER — ONDANSETRON HCL 4 MG/2ML IJ SOLN
4.0000 mg | Freq: Once | INTRAMUSCULAR | Status: AC
  Administered 2023-11-04: 4 mg via INTRAVENOUS
  Filled 2023-11-04: qty 2

## 2023-11-04 MED ORDER — MAGNESIUM SULFATE 2 GM/50ML IV SOLN
2.0000 g | Freq: Once | INTRAVENOUS | Status: AC
  Administered 2023-11-04: 2 g via INTRAVENOUS
  Filled 2023-11-04: qty 50

## 2023-11-04 MED ORDER — INSULIN ASPART 100 UNIT/ML IJ SOLN: 0.0000 [IU] | Freq: Three times a day (TID) | INTRAMUSCULAR | Status: DC

## 2023-11-04 MED ORDER — LACTATED RINGERS IV BOLUS
1000.0000 mL | Freq: Once | INTRAVENOUS | Status: AC
  Administered 2023-11-04: 1000 mL via INTRAVENOUS

## 2023-11-04 MED ORDER — BISACODYL 10 MG RE SUPP
10.0000 mg | Freq: Every day | RECTAL | Status: DC
  Filled 2023-11-04 (×3): qty 1

## 2023-11-04 MED ORDER — ACETAMINOPHEN 650 MG RE SUPP: 650.0000 mg | Freq: Four times a day (QID) | RECTAL | Status: DC | PRN

## 2023-11-04 NOTE — ED Notes (Signed)
 Pt assisted to bsc.

## 2023-11-04 NOTE — Consult Note (Addendum)
 [facility] Consult  Reason for Consult: Small bowel obstruction Referring Physician: Dr. Elyse Hand  Chief Complaint   Emesis; Diarrhea     HPI: Nicole Santos is a 53 y.o. female who presents to the hospital with abdominal pain, diarrhea, nausea, and vomiting.  She states that she has been having most of the symptoms for the last 3 to 4 weeks, and they seem to coincide with her Avastin  infusions and be related to receiving contrast.  She denies ever having a bowel obstruction in the past.  She last had a bowel movement yesterday/early this morning, and is unsure of the last time she passed gas, though it was likely yesterday/early this morning when she was having multiple episodes of diarrhea.  She had multiple episodes of emesis prior to presenting to the emergency department, but denies any emesis since being in the hospital.  She has felt nauseous while being in the emergency department.  Her past medical history is significant for peritoneal carcinomatosis, hypertension, diabetes, and hyperlipidemia.  Her surgical history is significant for diagnostic laparoscopy by Dr. Larrie Po in 2023, Port-A-Cath insertion, and omentectomy.  Patient takes Eliquis .  Upon evaluation in the emergency department, she was noted to be hemodynamically stable.  She had a mild leukocytosis of 10.6.  She underwent a CT of the abdomen and pelvis which demonstrated a suspected small bowel obstruction with narrowing/transition within the distal ileum, likely secondary to adhesions, a small right paramidline periumbilical ventral hernia containing a loop of small bowel with interval fluid, peritoneal nodules, and cholelithiasis.  This morning, she is still nauseous without any episodes of emesis.  She denies any further bowel function.  She has had some improvement in her abdominal pain.  Past Medical History:  Diagnosis Date   Acute pulmonary embolism (HCC) 03/06/2022   Acute respiratory disease due to  COVID-19 virus 02/02/2019   Anxiety    Cancer (HCC)    Depression    Dyspnea    Dysrhythmia    Family history of bladder cancer    Family history of breast cancer    History of diabetes mellitus    Resolved in Sept 2023 with weight loss.   Hypertension    Ovarian cancer (HCC) 11/14/2021   Peritoneal carcinomatosis (HCC)    Pneumonia    Sleep apnea    doesn't use CPAP   Tachycardia     Past Surgical History:  Procedure Laterality Date   KNEE SURGERY Right    Arthroscopic   LAPAROSCOPIC ABDOMINAL EXPLORATION N/A 11/13/2021   Procedure: Exploratory laparoscopy;  Surgeon: Alanda Allegra, MD;  Location: [facility];  Service: General;  Laterality: N/A;   PORTACATH PLACEMENT Left 11/15/2021   Procedure: INSERTION PORT-A-CATH;  Surgeon: Alanda Allegra, MD;  Location: [facility];  Service: General;  Laterality: Left;   TONSILLECTOMY AND ADENOIDECTOMY      Family History  Problem Relation Age of Onset   Hypertension Mother    Heart attack Paternal Uncle    Bladder Cancer Maternal Grandmother 15       non-smoker   Dementia Maternal Grandfather    Breast cancer Maternal Great-grandmother        MGF's mother   Colon cancer Neg Hx    Ovarian cancer Neg Hx    Endometrial cancer Neg Hx    Pancreatic cancer Neg Hx    Prostate cancer Neg Hx     Social History   Tobacco Use   Smoking status: Never   Smokeless tobacco: Never  Vaping  Use   Vaping status: Never Used  Substance Use Topics   Alcohol use: No   Drug use: No    Medications: I have reviewed the patient's current medications.  Allergies  Allergen Reactions   Augmentin  [Amoxicillin -Pot Clavulanate] Diarrhea and Nausea And Vomiting   Doxycycline     Abdominal cramps.   Topamax  [Topiramate ] Nausea And Vomiting     ROS:  Pertinent items are noted in HPI.  Blood pressure 137/65, pulse (!) 101, temperature 97.8 F (36.6 C), temperature source Oral, resp. rate (!) 21, height 5\' 2"  (1.575 m), weight 94 kg, SpO2 93%. Physical  Exam Vitals reviewed.  Constitutional:      Appearance: Normal appearance.  HENT:     Head: Normocephalic and atraumatic.  Eyes:     Extraocular Movements: Extraocular movements intact.     Pupils: Pupils are equal, round, and reactive to light.  Cardiovascular:     Rate and Rhythm: Normal rate.  Pulmonary:     Effort: Pulmonary effort is normal.  Abdominal:     Comments: Abdomen soft, mild distention, no percussion tenderness, nontender to palpation; no rigidity, guarding, rebound tenderness; soft nontender around the umbilicus and underlying her previous scar, the location of previous hernia, unable to distinctly palpate hernia defect given patient's body habitus  Musculoskeletal:        General: Normal range of motion.     Cervical back: Normal range of motion.  Skin:    General: Skin is warm and dry.  Neurological:     General: No focal deficit present.     Mental Status: She is alert and oriented to person, place, and time.  Psychiatric:        Mood and Affect: Mood normal.        Behavior: Behavior normal.     Results: Results for orders placed or performed during the hospital encounter of 11/04/23 (from the past 48 hours)  Lipase, blood     Status: None   Collection Time: 11/04/23  2:33 AM  Result Value Ref Range   Lipase 26 11 - 51 U/L    Comment: Performed at [facility], [address], [place], [name] [address]  Comprehensive metabolic panel     Status: Abnormal   Collection Time: 11/04/23  2:33 AM  Result Value Ref Range   Sodium 138 135 - 145 mmol/L   Potassium 3.5 3.5 - 5.1 mmol/L   Chloride 103 98 - 111 mmol/L   CO2 20 (L) 22 - 32 mmol/L   Glucose, Bld 194 (H) 70 - 99 mg/dL    Comment: Glucose reference range applies only to samples taken after fasting for at least 8 hours.   BUN 15 6 - 20 mg/dL   Creatinine, Ser 3.08 0.44 - 1.00 mg/dL   Calcium  9.6 8.9 - 10.3 mg/dL   Total Protein 7.0 6.5 - 8.1 g/dL   Albumin 3.7 3.5 - 5.0 g/dL   AST 25 15 - 41 U/L    ALT 13 0 - 44 U/L   Alkaline Phosphatase 58 38 - 126 U/L   Total Bilirubin 1.9 (H) 0.0 - 1.2 mg/dL   GFR, Estimated >65 >78 mL/min    Comment: (NOTE) Calculated using the CKD-EPI Creatinine Equation (2021)    Anion gap 15 5 - 15    Comment: Performed at [facility], [address], [place], [name] [address]  CBC     Status: Abnormal   Collection Time: 11/04/23  2:33 AM  Result Value  Ref Range   WBC 10.6 (H) 4.0 - 10.5 K/uL   RBC 2.88 (L) 3.87 - 5.11 MIL/uL   Hemoglobin 11.6 (L) 12.0 - 15.0 g/dL   HCT 16.1 (L) 09.6 - 04.5 %   MCV 114.9 (H) 80.0 - 100.0 fL   MCH 40.3 (H) 26.0 - 34.0 pg   MCHC 35.0 30.0 - 36.0 g/dL   RDW 40.9 (H) 81.1 - 91.4 %   Platelets 133 (L) 150 - 400 K/uL   nRBC 1.4 (H) 0.0 - 0.2 %    Comment: Performed at [facility], [address], [place], [name] [address]  Magnesium      Status: Abnormal   Collection Time: 11/04/23  2:33 AM  Result Value Ref Range   Magnesium  1.5 (L) 1.7 - 2.4 mg/dL    Comment: Performed at [facility], [address] [place], [name] [address]  Vitamin B12     Status: None   Collection Time: 11/04/23  2:33 AM  Result Value Ref Range   Vitamin B-12 189 180 - 914 pg/mL    Comment: (NOTE) This assay is not validated for testing neonatal or myeloproliferative syndrome specimens for Vitamin B12 levels. Performed at [facility], [address], [place], [name] [address]   Hemoglobin A1c     Status: None   Collection Time: 11/04/23  2:33 AM  Result Value Ref Range   Hgb A1c MFr Bld 5.3 4.8 - 5.6 %    Comment: (NOTE) Diagnosis of Diabetes The following HbA1c ranges recommended by the American Diabetes Association (ADA) may be used as an aid in the diagnosis of diabetes mellitus.  Hemoglobin             Suggested A1C NGSP%              Diagnosis  <5.7                   Non Diabetic  5.7-6.4                Pre-Diabetic  >6.4                   Diabetic  <7.0                   Glycemic control for                        adults with diabetes.     Mean Plasma Glucose 105.41 mg/dL    Comment: Performed at [facility], [address] [place], [name] [address]  Folate     Status: None   Collection Time: 11/04/23  2:33 AM  Result Value Ref Range   Folate 17.0 >5.9 ng/mL    Comment: Performed at [facility], [address], [place], [name] [address]  CBG monitoring, ED     Status: None   Collection Time: 11/04/23  7:22 AM  Result Value Ref Range   Glucose-Capillary 82 70 - 99 mg/dL    Comment: Glucose reference range applies only to samples taken after fasting for at least 8 hours.  CBG monitoring, ED     Status: Abnormal   Collection Time: 11/04/23 12:25 PM  Result Value Ref Range   Glucose-Capillary 105 (H) 70 - 99 mg/dL    Comment: Glucose reference range applies only to samples taken after fasting for at least 8 hours.    CT ABDOMEN PELVIS W CONTRAST Result Date: 11/04/2023 EXAM:  CT ABDOMEN AND PELVIS WITH CONTRAST 11/04/2023 03:20:52 AM TECHNIQUE: CT of the abdomen and pelvis was performed with the administration of iohexol  (OMNIPAQUE ) 300 MG/ML solution. Multiplanar reformatted images are provided for review. Automated exposure control, iterative reconstruction, and/or weight based adjustment of the mA/kV was utilized to reduce the radiation dose to as low as reasonably achievable. COMPARISON: 10/24/2023 CLINICAL HISTORY: Bowel obstruction suspected; history of peritoneal carcinomatosis, hernia and gall stones. Vomiting and diarrhea and feeling of fullness in abdomen at all times. Patient is a cancer patient and has been seen for this in the past by her cancer doctor but feels as if something else is "going on". FINDINGS: LOWER CHEST: No acute abnormality. LIVER: The liver is unremarkable. GALLBLADDER AND BILE DUCTS: Cholelithiasis, without associated inflammatory changes. SPLEEN: No acute abnormality. PANCREAS: No acute abnormality. ADRENAL GLANDS: No acute abnormality. KIDNEYS, URETERS AND  BLADDER: 2.6 cm simple left upper pole renal cyst (image 36), benign (Bosniak 1). No follow-up is recommended. No stones in the kidneys or ureters. No hydronephrosis. No perinephric or periureteral stranding. Urinary bladder is unremarkable. GI AND BOWEL: Small right paramidline periumbilical ventral hernia containing a knuckle of mildly dilated small bowel with interval fluid (image 61). Dilated small bowel mildly progressive with narrowing/transition of the distal ileum (image 67), possibly on the basis of adhesions. No appendicitis. PERITONEUM AND RETROPERITONEUM: Vague 11 mm peritoneal nodule beneath the right mid anterior abdominal wall (image 41), equivocal, although warranting attention on follow-up to exclude a peritoneal implant. No omental caking. Very mild mesenteric stranding/fluid in the right mid abdomen (image 50). No free air. VASCULATURE: Aorta is normal in caliber. LYMPH NODES: No lymphadenopathy. REPRODUCTIVE ORGANS: No acute abnormality. BONES AND SOFT TISSUES: Mild degenerative changes of the lower thoracic spine. No acute osseous abnormality. No focal soft tissue abnormality. IMPRESSION: 1. Suspected small bowel obstruction with narrowing/transition of the distal ileum, possibly on the basis of adhesions. 2. Small right paramidline periumbilical ventral hernia a loop of small bowel, now with interval fluid. Correlate for reducibility. 3. Vague 11 mm peritoneal nodule beneath the right mid anterior abdominal wall, equivocal. Attention on follow-up is suggested. 4. Cholelithiasis without associated inflammatory changes. Electronically signed by: Zadie Herter MD 11/04/2023 03:33 AM EDT RP Workstation: [id number]     Assessment & Plan:  Nicole Santos is a 53 y.o. female who was admitted with a small bowel obstruction.  Imaging and blood work evaluated by myself.  - I discussed the pathophysiology of bowel obstructions with the patient.  I discussed that we we will attempt nonoperative  management with NPO, IV fluids, and NG tube to LIS.  Given that she is still having persistent nausea, I feel the patient should have an NG tube inserted -I also discussed with the patient that the likely cause of her bowel obstruction is most likely adhesive disease from her peritoneal carcinomatosis.  Given that this is likely her cause for obstruction, she is not really a candidate for surgical intervention for her obstruction -Upon my evaluation of the patient's CT scan, she does have a ventral hernia containing a loop of dilated small bowel, however the bowel is dilated going both into and out of the hernia, suggesting against this being the source of her obstruction.  There appears to be a transition point deeper in her pelvis -NG tube to be inserted, will place on LIS -NPO -IV fluids -Monitor for return of bowel function -Will order Dulcolax suppositories -Hold Eliquis  while inpatient -No acute surgical intervention at  this time -Possible small bowel obstruction protocol tomorrow, pending clinical progression -Recommend consultation of oncology and palliative care for goals of care discussion if the patient does not clinically progress with conservative measures - Appreciate hospitalist recommendations  All questions were answered to the satisfaction of the patient.  Note: Portions of this report may have been transcribed using voice recognition software. Every effort has been made to ensure accuracy; however, inadvertent computerized transcription errors may still be present.   -- Lidia Reels, DO [facility] [address] [place], [name] [address] [phone] (office)

## 2023-11-04 NOTE — ED Provider Notes (Signed)
 [place] EMERGENCY DEPARTMENT AT [facility]  Provider Note  CSN: [id number] Arrival date & time: 11/04/23 5784  History Chief Complaint  Patient presents with   Emesis   Diarrhea    Nicole Santos is a 53 y.o. female with history of ovarian cancer with peritoneal carcinomatosis is about 3 weeks since her last Avastin  infusion, scheduled for next dose in 1-2 days. She reports she had her usual episodes of diarrhea after last treatment that had improved. Had a follow up CT done on 5/22 without signs of disease progression. There was a loop of small bowel in a ventral hernia and gall stones which are both chronic. Tonight she reports return of diarrhea, diffuse abdominal pain and vomiting. No known fever.    Home Medications Prior to Admission medications   Medication Sig Start Date End Date Taking? Authorizing Provider  apixaban  (ELIQUIS ) 5 MG TABS tablet Take 1 tablet (5 mg total) by mouth 2 (two) times daily. 07/03/23   Eliodoro Guerin, DO  atorvastatin  (LIPITOR) 20 MG tablet Take 1 tablet (20 mg total) by mouth at bedtime. 07/03/23   Eliodoro Guerin, DO  diphenoxylate -atropine  (LOMOTIL ) 2.5-0.025 MG tablet Take 2 tablets by mouth after the first watery bowel movement. May then take 1 pill by mouth after each watery bowel movement. Do not exceed 8 pills in a 24 hour period. 07/15/23   Paulett Boros, MD  DULoxetine  (CYMBALTA ) 30 MG capsule TAKE 1 CAPSULE BY MOUTH DAILY FOR 7 DAYS, THEN INCREASE TO 2 CAPSULES DAILY 06/03/23   Paulett Boros, MD  lidocaine -prilocaine  (EMLA ) cream Apply a quarter sized amount to port a cath site and cover with plastic wrap 1 hour prior to infusion appointments 12/17/22   Paulett Boros, MD  LYNPARZA  150 MG tablet TAKE 2 TABLETS TWICE A DAY. MAY TAKE WITH FOOD TO DECEASE NAUSEA AND VOMITING. (SWALLOW WHOLE) 07/19/23   Paulett Boros, MD  magnesium  oxide (MAG-OX) 400 (240 Mg) MG tablet TAKE 1 TABLET BY MOUTH TWICE A DAY  05/27/23   Paulett Boros, MD  Multiple Vitamin (MULTIVITAMIN) capsule Take 1 capsule by mouth daily.    [provider]  Potassium 99 MG TABS Take 99 mg by mouth at bedtime.    [provider]     Allergies    Augmentin  [amoxicillin -pot clavulanate], Doxycycline, and Topamax  [topiramate ]   Review of Systems   Review of Systems Please see HPI for pertinent positives and negatives  Physical Exam BP 103/71   Pulse (!) 102   Temp (!) 97.5 F (36.4 C) (Oral)   Resp (!) 23   Ht 5\' 2"  (1.575 m)   Wt 94 kg   SpO2 100%   BMI 37.90 kg/m   Physical Exam Vitals and nursing note reviewed.  Constitutional:      Appearance: Normal appearance.  HENT:     Head: Normocephalic and atraumatic.     Nose: Nose normal.     Mouth/Throat:     Mouth: Mucous membranes are moist.  Eyes:     Extraocular Movements: Extraocular movements intact.     Conjunctiva/sclera: Conjunctivae normal.  Cardiovascular:     Rate and Rhythm: Normal rate.  Pulmonary:     Effort: Pulmonary effort is normal.     Breath sounds: Normal breath sounds.  Abdominal:     General: Abdomen is flat.     Tenderness: There is abdominal tenderness. There is guarding (diffuse).  Musculoskeletal:        General: No  swelling. Normal range of motion.     Cervical back: Neck supple.  Skin:    General: Skin is warm and dry.  Neurological:     General: No focal deficit present.     Mental Status: She is alert.  Psychiatric:        Mood and Affect: Mood normal.     ED Results / Procedures / Treatments   EKG EKG Interpretation Date/Time:  Monday November 04 2023 02:28:23 EDT Ventricular Rate:  104 PR Interval:  137 QRS Duration:  97 QT Interval:  367 QTC Calculation: 483 R Axis:   76  Text Interpretation: Sinus tachycardia Low voltage, precordial leads Borderline T abnormalities, anterior leads Since last tracing Rate faster Confirmed by Shawnee Dellen [phone]) on 11/04/2023 2:44:13  AM  Procedures Procedures  Medications Ordered in the ED Medications  morphine  (PF) 4 MG/ML injection 4 mg (4 mg Intravenous Given 11/04/23 0344)  ondansetron  (ZOFRAN ) injection 4 mg (4 mg Intravenous Given 11/04/23 0344)  lactated ringers  bolus 1,000 mL (1,000 mLs Intravenous New Bag/Given 11/04/23 0343)  iohexol  (OMNIPAQUE ) 300 MG/ML solution 100 mL (100 mLs Intravenous Contrast Given 11/04/23 0312)    Initial Impression and Plan  Patient here with abdominal pain and vomiting, some diarrhea, but no fever. She has multiple issues which could be contributing including cancer, gall stones and hernia. Will check labs, send for CT and give pain/nausea meds for comfort  ED Course   Clinical Course as of 11/04/23 0449  Mon Nov 04, 2023  0252 CBC with mild leukocytosis, otherwise about at baseline.  [CS]  0303 CMP with mildly increased bilirubin, similar to previous. Otherwise normal LFTs and lipase.  [CS]  0340 I personally viewed the images from radiology studies and agree with radiologist interpretation: CT shows a SBO although transition point is not in her ventral hernia which is not particular tender. Will plan admission for bowel rest and pain control.  [CS]  [phone] Spoke with Dr. Elyse Hand, Hospitalist, who will evaluate for admission.  [CS]    Clinical Course User Index [CS] Charmayne Cooper, MD     MDM Rules/Calculators/A&P Medical Decision Making Problems Addressed: Small bowel obstruction [facility]): acute illness or injury  Amount and/or Complexity of Data Reviewed Labs: ordered. Decision-making details documented in ED Course. Radiology: ordered and independent interpretation performed. Decision-making details documented in ED Course.  Risk Prescription drug management. Parenteral controlled substances. Decision regarding hospitalization.     Final Clinical Impression(s) / ED Diagnoses Final diagnoses:  Small bowel obstruction [facility])    Rx / DC Orders ED Discharge Orders      None        Charmayne Cooper, MD 11/04/23 [phone]

## 2023-11-04 NOTE — H&P (Signed)
 History and Physical    Patient: Nicole Santos ZOX:[id number] DOB: 1971/05/30 DOA: 11/04/2023 DOS: the patient was seen and examined on 11/04/2023 PCP: Eliodoro Guerin, DO  Patient coming from: Home  Chief Complaint:  Chief Complaint  Patient presents with   Emesis   Diarrhea   HPI: Nicole Santos is a 53 y.o. female with medical history significant of High-grade serous carcinoma of peritoneum, peripheral neuropathy, pulmonary embolism, hyperlipidemia who presents to the emergency department due to diarrhea, diffuse abdominal pain, vomiting.  Patient states that she usually have nausea, vomiting and diarrhea for a few days after chemotherapy infusion and this has improved.  CT abdomen pelvis done on 5/22 showed no signs of disease progression.There is a small periumbilical hernia containing a small bowel loop which is chronic. Tonight, she complained of nausea and vomiting with diarrhea and  Abdominal pain rated as 10/10 on the pain scale.  Patient also complained of intermittent bright red blood per rectum.  Last bowel movement was few hours PTA and there was no blood noted.  ED Course:  In the emergency department, she was tachycardic, tachypneic, temperature 97.72F, BP 103/71 and O2 sats of 100% on room air.  Workup in the ED showed WBC of 10.6, microcytic anemia.  BMP was normal except for bicarb of 20 and blood glucose of 194, lipase 26, magnesium  1.5. CT abdomen and pelvis with contrast showed suspected  small bowel obstruction with narrowing/transition of the distal ileum, possibly on the basis of adhesions. She was treated with morphine , Zofran  was given and IV hydration was provided.  TRH was asked to admit patient.  Review of Systems: Review of systems as noted in the HPI. All other systems reviewed and are negative.   Past Medical History:  Diagnosis Date   Acute pulmonary embolism (HCC) 03/06/2022   Acute respiratory disease due to COVID-19 virus 02/02/2019   Anxiety    Cancer  (HCC)    Depression    Dyspnea    Dysrhythmia    Family history of bladder cancer    Family history of breast cancer    History of diabetes mellitus    Resolved in Sept 2023 with weight loss.   Hypertension    Ovarian cancer (HCC) 11/14/2021   Peritoneal carcinomatosis (HCC)    Pneumonia    Sleep apnea    doesn't use CPAP   Tachycardia    Past Surgical History:  Procedure Laterality Date   KNEE SURGERY Right    Arthroscopic   LAPAROSCOPIC ABDOMINAL EXPLORATION N/A 11/13/2021   Procedure: Exploratory laparoscopy;  Surgeon: Alanda Allegra, MD;  Location: [facility];  Service: General;  Laterality: N/A;   PORTACATH PLACEMENT Left 11/15/2021   Procedure: INSERTION PORT-A-CATH;  Surgeon: Alanda Allegra, MD;  Location: [facility];  Service: General;  Laterality: Left;   TONSILLECTOMY AND ADENOIDECTOMY      Social History:  reports that she has never smoked. She has never used smokeless tobacco. She reports that she does not drink alcohol and does not use drugs.   Allergies  Allergen Reactions   Augmentin  [Amoxicillin -Pot Clavulanate] Diarrhea and Nausea And Vomiting   Doxycycline     Abdominal cramps.   Topamax  [Topiramate ] Nausea And Vomiting    Family History  Problem Relation Age of Onset   Hypertension Mother    Heart attack Paternal Uncle    Bladder Cancer Maternal Grandmother 84       non-smoker   Dementia Maternal Grandfather    Breast cancer Maternal  Great-grandmother        MGF's mother   Colon cancer Neg Hx    Ovarian cancer Neg Hx    Endometrial cancer Neg Hx    Pancreatic cancer Neg Hx    Prostate cancer Neg Hx      Prior to Admission medications   Medication Sig Start Date End Date Taking? Authorizing Provider  apixaban  (ELIQUIS ) 5 MG TABS tablet Take 1 tablet (5 mg total) by mouth 2 (two) times daily. 07/03/23   Eliodoro Guerin, DO  atorvastatin  (LIPITOR) 20 MG tablet Take 1 tablet (20 mg total) by mouth at bedtime. 07/03/23   Eliodoro Guerin, DO   diphenoxylate -atropine  (LOMOTIL ) 2.5-0.025 MG tablet Take 2 tablets by mouth after the first watery bowel movement. May then take 1 pill by mouth after each watery bowel movement. Do not exceed 8 pills in a 24 hour period. 07/15/23   Paulett Boros, MD  DULoxetine  (CYMBALTA ) 30 MG capsule TAKE 1 CAPSULE BY MOUTH DAILY FOR 7 DAYS, THEN INCREASE TO 2 CAPSULES DAILY 06/03/23   Paulett Boros, MD  lidocaine -prilocaine  (EMLA ) cream Apply a quarter sized amount to port a cath site and cover with plastic wrap 1 hour prior to infusion appointments 12/17/22   Paulett Boros, MD  LYNPARZA  150 MG tablet TAKE 2 TABLETS TWICE A DAY. MAY TAKE WITH FOOD TO DECEASE NAUSEA AND VOMITING. (SWALLOW WHOLE) 07/19/23   Paulett Boros, MD  magnesium  oxide (MAG-OX) 400 (240 Mg) MG tablet TAKE 1 TABLET BY MOUTH TWICE A DAY 05/27/23   Paulett Boros, MD  Multiple Vitamin (MULTIVITAMIN) capsule Take 1 capsule by mouth daily.    [provider]  Potassium 99 MG TABS Take 99 mg by mouth at bedtime.    [provider]    Physical Exam: BP 129/68   Pulse 73   Temp (!) 97.5 F (36.4 C) (Oral)   Resp (!) 24   Ht 5\' 2"  (1.575 m)   Wt 94 kg   SpO2 98%   BMI 37.90 kg/m   General: 53 y.o. year-old female well developed well nourished in no acute distress.  Alert and oriented x3. HEENT: NCAT, EOMI Neck: Supple, trachea medial Cardiovascular: Regular rate and rhythm with no rubs or gallops.  No thyromegaly or JVD noted.  No lower extremity edema. 2/4 pulses in all 4 extremities. Respiratory: Clear to auscultation with no wheezes or rales. Good inspiratory effort. Abdomen: Soft, tender to palpation with guarding to deep palpation.  Nondistended with normal bowel sounds x4 quadrants. Muskuloskeletal: No cyanosis, clubbing or edema noted bilaterally Neuro: CN II-XII intact, strength 5/5 x 4, sensation, reflexes intact Skin: No ulcerative lesions noted or rashes Psychiatry: Judgement  and insight appear normal. Mood is appropriate for condition and setting          Labs on Admission:  Basic Metabolic Panel: Recent Labs  Lab 11/04/23 0233  NA 138  K 3.5  CL 103  CO2 20*  GLUCOSE 194*  BUN 15  CREATININE 0.98  CALCIUM  9.6  MG 1.5*   Liver Function Tests: Recent Labs  Lab 11/04/23 0233  AST 25  ALT 13  ALKPHOS 58  BILITOT 1.9*  PROT 7.0  ALBUMIN 3.7   Recent Labs  Lab 11/04/23 0233  LIPASE 26   No results for input(s): "AMMONIA" in the last 168 hours. CBC: Recent Labs  Lab 11/04/23 0233  WBC 10.6*  HGB 11.6*  HCT 33.1*  MCV 114.9*  PLT 133*   Cardiac Enzymes:  No results for input(s): "CKTOTAL", "CKMB", "CKMBINDEX", "TROPONINI" in the last 168 hours.  BNP (last 3 results) No results for input(s): "BNP" in the last 8760 hours.  ProBNP (last 3 results) No results for input(s): "PROBNP" in the last 8760 hours.  CBG: No results for input(s): "GLUCAP" in the last 168 hours.  Radiological Exams on Admission: CT ABDOMEN PELVIS W CONTRAST Result Date: 11/04/2023 EXAM: CT ABDOMEN AND PELVIS WITH CONTRAST 11/04/2023 03:20:52 AM TECHNIQUE: CT of the abdomen and pelvis was performed with the administration of iohexol  (OMNIPAQUE ) 300 MG/ML solution. Multiplanar reformatted images are provided for review. Automated exposure control, iterative reconstruction, and/or weight based adjustment of the mA/kV was utilized to reduce the radiation dose to as low as reasonably achievable. COMPARISON: 10/24/2023 CLINICAL HISTORY: Bowel obstruction suspected; history of peritoneal carcinomatosis, hernia and gall stones. Vomiting and diarrhea and feeling of fullness in abdomen at all times. Patient is a cancer patient and has been seen for this in the past by her cancer doctor but feels as if something else is "going on". FINDINGS: LOWER CHEST: No acute abnormality. LIVER: The liver is unremarkable. GALLBLADDER AND BILE DUCTS: Cholelithiasis, without associated  inflammatory changes. SPLEEN: No acute abnormality. PANCREAS: No acute abnormality. ADRENAL GLANDS: No acute abnormality. KIDNEYS, URETERS AND BLADDER: 2.6 cm simple left upper pole renal cyst (image 36), benign (Bosniak 1). No follow-up is recommended. No stones in the kidneys or ureters. No hydronephrosis. No perinephric or periureteral stranding. Urinary bladder is unremarkable. GI AND BOWEL: Small right paramidline periumbilical ventral hernia containing a knuckle of mildly dilated small bowel with interval fluid (image 61). Dilated small bowel mildly progressive with narrowing/transition of the distal ileum (image 67), possibly on the basis of adhesions. No appendicitis. PERITONEUM AND RETROPERITONEUM: Vague 11 mm peritoneal nodule beneath the right mid anterior abdominal wall (image 41), equivocal, although warranting attention on follow-up to exclude a peritoneal implant. No omental caking. Very mild mesenteric stranding/fluid in the right mid abdomen (image 50). No free air. VASCULATURE: Aorta is normal in caliber. LYMPH NODES: No lymphadenopathy. REPRODUCTIVE ORGANS: No acute abnormality. BONES AND SOFT TISSUES: Mild degenerative changes of the lower thoracic spine. No acute osseous abnormality. No focal soft tissue abnormality. IMPRESSION: 1. Suspected small bowel obstruction with narrowing/transition of the distal ileum, possibly on the basis of adhesions. 2. Small right paramidline periumbilical ventral hernia a loop of small bowel, now with interval fluid. Correlate for reducibility. 3. Vague 11 mm peritoneal nodule beneath the right mid anterior abdominal wall, equivocal. Attention on follow-up is suggested. 4. Cholelithiasis without associated inflammatory changes. Electronically signed by: Zadie Herter MD 11/04/2023 03:33 AM EDT RP Workstation: [id number]    EKG: I independently viewed the EKG done and my findings are as followed: Sinus tachycardia at a rate of 104  bpm  Assessment/Plan Present on Admission:  Small bowel obstruction (HCC)  Mixed hyperlipidemia  Pulmonary embolism (HCC)  Principal Problem:   Small bowel obstruction (HCC) Active Problems:   Mixed hyperlipidemia   Pulmonary embolism (HCC)   Abdominal pain   Ventral hernia   Macrocytic anemia   Hypomagnesemia   Prediabetes   Hyperglycemia   Peripheral neuropathy  Small bowel obstruction Patient has not had any vomiting since arrival to the ED; no indication for NG tube at this time. Continue NPO at this time with plan to advance diet as tolerated Continue IV hydration Continue IV morphine  2 mg q.4h p.r.n. for moderate/severe pain Continue Zofran  p.r.n. for nausea/vomiting General Surgery will be consulted  and we will await further recommendations  Abdominal pain possibly due to ventral hernia CT abdomen pelvis with contrast showed small l right paramidline periumbilical ventral hernia a loop of small bowel,now with interval fluid. General surgery will be consulted for reducibility/repair  Macrocytic anemia MCV 114.9; vitamin B12 and folate levels will be checked  Hypomagnesemia Mag 1.5, this will be replenished  Prediabetes with hyperglycemia Hemoglobin A1c on 07/03/2023 was 5.7 Continue ISS and hypoglycemia protocol  Mixed hyperlipidemia Continue home meds when patient resumes oral intake  Pulmonary embolism Continue Lovenox  Consider transitioning back to home Eliquis  when patient resumes oral intake  High-grade serous carcinoma of peritoneum Patient follows with Dr. Cheree Cords Continue home meds when patient resumes oral intake  Peripheral neuropathy Continue home meds when patient resumes oral intake  DVT prophylaxis: Lovenox   Code Status: Full code  Family Communication: Husband and son at bedside (all questions answered to facility)  Consults: General Surgery  Severity of Illness: The appropriate patient status for this patient is INPATIENT.  Inpatient status is judged to be reasonable and necessary in order to provide the required intensity of service to ensure the patient's safety. The patient's presenting symptoms, physical exam findings, and initial radiographic and laboratory data in the context of their chronic comorbidities is felt to place them at high risk for further clinical deterioration. Furthermore, it is not anticipated that the patient will be medically stable for discharge from the hospital within 2 midnights of admission.   * I certify that at the point of admission it is my clinical judgment that the patient will require inpatient hospital care spanning beyond 2 midnights from the point of admission due to high intensity of service, high risk for further deterioration and high frequency of surveillance required.*  Author: Chase Knebel, DO 11/04/2023 7:11 AM  For on call review [email or web address].

## 2023-11-04 NOTE — ED Triage Notes (Signed)
 Pt with vomiting and diarrhea and feeling of fullness in abdomen at all times. Pt is Cancer pt and has been seen for this in the past by her cancer doctor but feels as if something else is "going on".

## 2023-11-04 NOTE — Plan of Care (Signed)
   Problem: Education: Goal: Knowledge of General Education information will improve Description Including pain rating scale, medication(s)/side effects and non-pharmacologic comfort measures Outcome: Progressing   Problem: Education: Goal: Knowledge of General Education information will improve Description Including pain rating scale, medication(s)/side effects and non-pharmacologic comfort measures Outcome: Progressing

## 2023-11-04 NOTE — ED Notes (Signed)
 ED TO INPATIENT HANDOFF REPORT  ED Nurse Name and Phone #: [phone]  S Name/Age/Gender Nicole Santos 53 y.o. female Room/Bed: APA06/APA06  Code Status   Code Status: Full Code  Home/SNF/Other Home Patient oriented to: self, place, time, and situation Is this baseline? Yes   Triage Complete: Triage complete  Chief Complaint Small bowel obstruction (HCC) [K56.609]  Triage Note Pt with vomiting and diarrhea and feeling of fullness in abdomen at all times. Pt is Cancer pt and has been seen for this in the past by her cancer doctor but feels as if something else is "going on".    Allergies Allergies  Allergen Reactions   Augmentin  [Amoxicillin -Pot Clavulanate] Diarrhea and Nausea And Vomiting   Doxycycline     Abdominal cramps.   Topamax  [Topiramate ] Nausea And Vomiting    Level of Care/Admitting Diagnosis ED Disposition     ED Disposition  Admit   Condition  --   Comment  Hospital Area: [facility] [[id number]]  Level of Care: Med-Surg [16]  Covid Evaluation: Asymptomatic - no recent exposure (last 10 days) testing not required  Diagnosis: Small bowel obstruction [facility]) [[id number]]  Admitting Physician: ADEFESO, OLADAPO [[id number]]  Attending Physician: ADEFESO, OLADAPO [[id number]]  Certification:: I certify this patient will need inpatient services for at least 2 midnights  Expected Medical Readiness: 11/07/2023          B Medical/Surgery History Past Medical History:  Diagnosis Date   Acute pulmonary embolism (HCC) 03/06/2022   Acute respiratory disease due to COVID-19 virus 02/02/2019   Anxiety    Cancer (HCC)    Depression    Dyspnea    Dysrhythmia    Family history of bladder cancer    Family history of breast cancer    History of diabetes mellitus    Resolved in Sept 2023 with weight loss.   Hypertension    Ovarian cancer (HCC) 11/14/2021   Peritoneal carcinomatosis (HCC)    Pneumonia    Sleep apnea    doesn't use CPAP   Tachycardia    Past  Surgical History:  Procedure Laterality Date   KNEE SURGERY Right    Arthroscopic   LAPAROSCOPIC ABDOMINAL EXPLORATION N/A 11/13/2021   Procedure: Exploratory laparoscopy;  Surgeon: Alanda Allegra, MD;  Location: [facility];  Service: General;  Laterality: N/A;   PORTACATH PLACEMENT Left 11/15/2021   Procedure: INSERTION PORT-A-CATH;  Surgeon: Alanda Allegra, MD;  Location: [facility];  Service: General;  Laterality: Left;   TONSILLECTOMY AND ADENOIDECTOMY       A IV Location/Drains/Wounds Patient Lines/Drains/Airways Status     Active Line/Drains/Airways     Name Placement date Placement time Site Days   Implanted Port 11/15/21 Left Chest 11/15/21  1132  Chest  719   Peripheral IV 11/04/23 20 G Anterior;Left;Proximal Forearm 11/04/23  0233  Forearm  less than 1   Incision - 4 Ports Abdomen 1: Right;Upper;Lateral 2: Right;Lateral 3: Medial;Upper 4: Umbilicus 11/13/21  1146  -- 721            Intake/Output Last 24 hours  Intake/Output Summary (Last 24 hours) at 11/04/2023 1245 Last data filed at 11/04/2023 0865 Gross per 24 hour  Intake 1047.1 ml  Output --  Net 1047.1 ml    Labs/Imaging Results for orders placed or performed during the hospital encounter of 11/04/23 (from the past 48 hours)  Lipase, blood     Status: None   Collection Time: 11/04/23  2:33 AM  Result Value Ref Range  Lipase 26 11 - 51 U/L    Comment: Performed at [facility], [address] [place], [name] [address]  Comprehensive metabolic panel     Status: Abnormal   Collection Time: 11/04/23  2:33 AM  Result Value Ref Range   Sodium 138 135 - 145 mmol/L   Potassium 3.5 3.5 - 5.1 mmol/L   Chloride 103 98 - 111 mmol/L   CO2 20 (L) 22 - 32 mmol/L   Glucose, Bld 194 (H) 70 - 99 mg/dL    Comment: Glucose reference range applies only to samples taken after fasting for at least 8 hours.   BUN 15 6 - 20 mg/dL   Creatinine, Ser 5.28 0.44 - 1.00 mg/dL   Calcium  9.6 8.9 - 10.3 mg/dL   Total Protein 7.0 6.5 - 8.1  g/dL   Albumin 3.7 3.5 - 5.0 g/dL   AST 25 15 - 41 U/L   ALT 13 0 - 44 U/L   Alkaline Phosphatase 58 38 - 126 U/L   Total Bilirubin 1.9 (H) 0.0 - 1.2 mg/dL   GFR, Estimated >41 >32 mL/min    Comment: (NOTE) Calculated using the CKD-EPI Creatinine Equation (2021)    Anion gap 15 5 - 15    Comment: Performed at [facility], [address], [place], [name] [address]  CBC     Status: Abnormal   Collection Time: 11/04/23  2:33 AM  Result Value Ref Range   WBC 10.6 (H) 4.0 - 10.5 K/uL   RBC 2.88 (L) 3.87 - 5.11 MIL/uL   Hemoglobin 11.6 (L) 12.0 - 15.0 g/dL   HCT 27.2 (L) 53.6 - 64.4 %   MCV 114.9 (H) 80.0 - 100.0 fL   MCH 40.3 (H) 26.0 - 34.0 pg   MCHC 35.0 30.0 - 36.0 g/dL   RDW 03.4 (H) 74.2 - 59.5 %   Platelets 133 (L) 150 - 400 K/uL   nRBC 1.4 (H) 0.0 - 0.2 %    Comment: Performed at [facility], [address] [place], [name] [address]  Magnesium      Status: Abnormal   Collection Time: 11/04/23  2:33 AM  Result Value Ref Range   Magnesium  1.5 (L) 1.7 - 2.4 mg/dL    Comment: Performed at [facility], [address], [place], [name] [address]  Vitamin B12     Status: None   Collection Time: 11/04/23  2:33 AM  Result Value Ref Range   Vitamin B-12 189 180 - 914 pg/mL    Comment: (NOTE) This assay is not validated for testing neonatal or myeloproliferative syndrome specimens for Vitamin B12 levels. Performed at [facility], [address], [place], [name] [address]   Folate     Status: None   Collection Time: 11/04/23  2:33 AM  Result Value Ref Range   Folate 17.0 >5.9 ng/mL    Comment: Performed at [facility], [address], [place], [name] [address]  CBG monitoring, ED     Status: None   Collection Time: 11/04/23  7:22 AM  Result Value Ref Range   Glucose-Capillary 82 70 - 99 mg/dL    Comment: Glucose reference range applies only to samples taken after fasting for at least 8 hours.  CBG monitoring, ED     Status: Abnormal   Collection Time: 11/04/23 12:25 PM   Result Value Ref Range   Glucose-Capillary 105 (H) 70 - 99 mg/dL    Comment: Glucose reference range applies only to samples taken after fasting for  at least 8 hours.   CT ABDOMEN PELVIS W CONTRAST Result Date: 11/04/2023 EXAM: CT ABDOMEN AND PELVIS WITH CONTRAST 11/04/2023 03:20:52 AM TECHNIQUE: CT of the abdomen and pelvis was performed with the administration of iohexol  (OMNIPAQUE ) 300 MG/ML solution. Multiplanar reformatted images are provided for review. Automated exposure control, iterative reconstruction, and/or weight based adjustment of the mA/kV was utilized to reduce the radiation dose to as low as reasonably achievable. COMPARISON: 10/24/2023 CLINICAL HISTORY: Bowel obstruction suspected; history of peritoneal carcinomatosis, hernia and gall stones. Vomiting and diarrhea and feeling of fullness in abdomen at all times. Patient is a cancer patient and has been seen for this in the past by her cancer doctor but feels as if something else is "going on". FINDINGS: LOWER CHEST: No acute abnormality. LIVER: The liver is unremarkable. GALLBLADDER AND BILE DUCTS: Cholelithiasis, without associated inflammatory changes. SPLEEN: No acute abnormality. PANCREAS: No acute abnormality. ADRENAL GLANDS: No acute abnormality. KIDNEYS, URETERS AND BLADDER: 2.6 cm simple left upper pole renal cyst (image 36), benign (Bosniak 1). No follow-up is recommended. No stones in the kidneys or ureters. No hydronephrosis. No perinephric or periureteral stranding. Urinary bladder is unremarkable. GI AND BOWEL: Small right paramidline periumbilical ventral hernia containing a knuckle of mildly dilated small bowel with interval fluid (image 61). Dilated small bowel mildly progressive with narrowing/transition of the distal ileum (image 67), possibly on the basis of adhesions. No appendicitis. PERITONEUM AND RETROPERITONEUM: Vague 11 mm peritoneal nodule beneath the right mid anterior abdominal wall (image 41), equivocal,  although warranting attention on follow-up to exclude a peritoneal implant. No omental caking. Very mild mesenteric stranding/fluid in the right mid abdomen (image 50). No free air. VASCULATURE: Aorta is normal in caliber. LYMPH NODES: No lymphadenopathy. REPRODUCTIVE ORGANS: No acute abnormality. BONES AND SOFT TISSUES: Mild degenerative changes of the lower thoracic spine. No acute osseous abnormality. No focal soft tissue abnormality. IMPRESSION: 1. Suspected small bowel obstruction with narrowing/transition of the distal ileum, possibly on the basis of adhesions. 2. Small right paramidline periumbilical ventral hernia a loop of small bowel, now with interval fluid. Correlate for reducibility. 3. Vague 11 mm peritoneal nodule beneath the right mid anterior abdominal wall, equivocal. Attention on follow-up is suggested. 4. Cholelithiasis without associated inflammatory changes. Electronically signed by: Zadie Herter MD 11/04/2023 03:33 AM EDT RP Workstation: [id number]    Pending Labs Unresulted Labs (From admission, onward)     Start     Ordered   11/05/23 0500  Comprehensive metabolic panel  Tomorrow morning,   R        11/04/23 0657   11/05/23 0500  CBC  Tomorrow morning,   R        11/04/23 0657   11/05/23 0500  Magnesium   Tomorrow morning,   R        11/04/23 0657   11/05/23 0500  Phosphorus  Tomorrow morning,   R        11/04/23 0657   11/04/23 0656  HIV Antibody (routine testing w rflx)  (HIV Antibody (Routine testing w reflex) panel)  Once,   R        11/04/23 0657   11/04/23 0646  Hemoglobin A1c  Once,   R       Comments: To assess prior glycemic control    11/04/23 0646   11/04/23 0228  Urinalysis, Routine w reflex microscopic -Urine, Clean Catch  Once,   URGENT       Question:  Specimen Source  Answer:  Urine, Clean Catch   11/04/23 0228            Vitals/Pain Today's Vitals   11/04/23 1130 11/04/23 1130 11/04/23 1145 11/04/23 1215  BP: 136/69  136/75 137/65   Pulse: 100  97 (!) 101  Resp: (!) 5  18 (!) 21  Temp:  97.8 F (36.6 C)    TempSrc:  Oral    SpO2: 98%  96% 93%  Weight:      Height:      PainSc:        Isolation Precautions No active isolations  Medications Medications  lactated ringers  infusion ( Intravenous Bolus from Bag 11/04/23 0757)  morphine  (PF) 2 MG/ML injection 2 mg (2 mg Intravenous Given 11/04/23 0756)  acetaminophen  (TYLENOL ) tablet 650 mg (has no administration in time range)    Or  acetaminophen  (TYLENOL ) suppository 650 mg (has no administration in time range)  ondansetron  (ZOFRAN ) tablet 4 mg (has no administration in time range)    Or  ondansetron  (ZOFRAN ) injection 4 mg (has no administration in time range)  enoxaparin  (LOVENOX ) injection 95 mg (95 mg Subcutaneous Given 11/04/23 0957)  insulin  aspart (novoLOG ) injection 0-9 Units ( Subcutaneous Not Given 11/04/23 1228)  morphine  (PF) 4 MG/ML injection 4 mg (4 mg Intravenous Given 11/04/23 0344)  ondansetron  (ZOFRAN ) injection 4 mg (4 mg Intravenous Given 11/04/23 0344)  lactated ringers  bolus 1,000 mL (0 mLs Intravenous Stopped 11/04/23 0614)  iohexol  (OMNIPAQUE ) 300 MG/ML solution 100 mL (100 mLs Intravenous Contrast Given 11/04/23 0312)  magnesium  sulfate IVPB 2 g 50 mL (0 g Intravenous Stopped 11/04/23 0916)  lidocaine  (XYLOCAINE ) 2 % jelly 1 Application (1 Application Other Given 11/04/23 1131)    Mobility manual wheelchair     Focused Assessments    R Recommendations: See Admitting Provider Note  Report given to:   Additional Notes:

## 2023-11-04 NOTE — Progress Notes (Addendum)
 Nurse at bedside,patient alert and oriented  times four.Patient did c/o abdominal discomfort rated a 3, patient called them spasms,that were off and on.Patient refused pain medication at this time.Patient also refused Dulcolax suppository,patient stated"I had a bowel movement in the ED",Dr Mason Sole notified.NG-tube checked for placement positive for placement checked by auscultation,NG-tube connected to low intermittent  suction per Dr Cherilyn Corn orders .Plan of care on going.Family at the bedside.

## 2023-11-04 NOTE — Progress Notes (Signed)
 PHARMACY - ANTICOAGULATION CONSULT NOTE  Pharmacy Consult for lovenox  Indication: history of PE  Allergies  Allergen Reactions   Augmentin  [Amoxicillin -Pot Clavulanate] Diarrhea and Nausea And Vomiting   Doxycycline     Abdominal cramps.   Topamax  [Topiramate ] Nausea And Vomiting    Patient Measurements: Height: 5\' 2"  (157.5 cm) Weight: 94 kg (207 lb 3.7 oz) IBW/kg (Calculated) : 50.1 HEPARIN  DW (KG): 72  Vital Signs: Temp: 98.1 F (36.7 C) (06/02 0742) Temp Source: Oral (06/02 0742) BP: 129/68 (06/02 0630) Pulse Rate: 73 (06/02 0630)  Labs: Recent Labs    11/04/23 0233  HGB 11.6*  HCT 33.1*  PLT 133*  CREATININE 0.98    Estimated Creatinine Clearance: 69.3 mL/min (by C-G formula based on SCr of 0.98 mg/dL).   Medical History: Past Medical History:  Diagnosis Date   Acute pulmonary embolism (HCC) 03/06/2022   Acute respiratory disease due to COVID-19 virus 02/02/2019   Anxiety    Cancer (HCC)    Depression    Dyspnea    Dysrhythmia    Family history of bladder cancer    Family history of breast cancer    History of diabetes mellitus    Resolved in Sept 2023 with weight loss.   Hypertension    Ovarian cancer (HCC) 11/14/2021   Peritoneal carcinomatosis (HCC)    Pneumonia    Sleep apnea    doesn't use CPAP   Tachycardia     Medications:  (Not in a hospital admission)   Assessment: Patient admitted with small bowel obstruction.  Holding prior to admission Eliquis  and starting Lovenox   Goal of Therapy:   Monitor platelets by anticoagulation protocol: Yes   Plan:  Lovenox  95 mg subcutaneous every 12 hours.  Cliffton Dama, PharmD Clinical Pharmacist 11/04/2023 9:34 AM

## 2023-11-04 NOTE — Progress Notes (Signed)
 PROGRESS NOTE    Nicole Santos  WJX:[id number] DOB: [birth year] DOA: 11/04/2023 PCP: Eliodoro Guerin, DO   Brief Narrative:    Nicole Santos is a 53 y.o. female with medical history significant of High-grade serous carcinoma of peritoneum, peripheral neuropathy, pulmonary embolism, hyperlipidemia who presents to the emergency department due to diarrhea, diffuse abdominal pain, vomiting.  Patient has been admitted for evaluation of small bowel obstruction and general surgery consulted.  Assessment & Plan:   Principal Problem:   Small bowel obstruction (HCC) Active Problems:   Mixed hyperlipidemia   Pulmonary embolism (HCC)   Abdominal pain   Ventral hernia   Macrocytic anemia   Hypomagnesemia   Prediabetes   Hyperglycemia   Peripheral neuropathy  Assessment and Plan:   Small bowel obstruction Patient has not had any vomiting since arrival to the ED; no indication for NG tube at this time. Continue NPO at this time with plan to advance diet as tolerated Continue IV hydration Continue IV morphine  2 mg q.4h p.r.n. for moderate/severe pain Continue Zofran  p.r.n. for nausea/vomiting General Surgery will be consulted and we will await further recommendations   Abdominal pain possibly due to ventral hernia CT abdomen pelvis with contrast showed small l right paramidline periumbilical ventral hernia a loop of small bowel,now with interval fluid. General surgery will be consulted for reducibility/repair   Macrocytic anemia MCV 114.9; vitamin B12 and folate levels will be checked   Hypomagnesemia Mag 1.5, this will be replenished   Prediabetes with hyperglycemia Hemoglobin A1c on 07/03/2023 was 5.7 Continue ISS and hypoglycemia protocol   Mixed hyperlipidemia Continue home meds when patient resumes oral intake   Pulmonary embolism Continue Lovenox  full dose Consider transitioning back to home Eliquis  when patient resumes oral intake   High-grade serous carcinoma of  peritoneum Patient follows with Dr. Cheree Cords Continue home meds when patient resumes oral intake   Peripheral neuropathy Continue home meds when patient resumes oral intake  Obesity, class II BMI 37.9   DVT prophylaxis: Full dose Lovenox  Code Status: Full Family Communication: None at bedside Disposition Plan:  Status is: Inpatient Remains inpatient appropriate because: Need for IV medications/fluids  Consultants:  General surgery  Procedures:  None  Antimicrobials:  None   Subjective: Patient seen and evaluated today with no new acute complaints or concerns. No acute concerns or events noted overnight.  She states that she is having some hiccups and some associated nausea, but no further vomiting.  Denies any bowel movement or flatus.  Objective: Vitals:   11/04/23 0845 11/04/23 0900 11/04/23 0915 11/04/23 0930  BP: (!) 134/59 (!) 133/56 133/63 138/75  Pulse: 96 99 95 96  Resp: 15 14 (!) 23 11  Temp:      TempSrc:      SpO2: (!) 83% (!) 88% 90% 97%  Weight:      Height:        Intake/Output Summary (Last 24 hours) at 11/04/2023 1013 Last data filed at 11/04/2023 0916 Gross per 24 hour  Intake 1047.1 ml  Output --  Net 1047.1 ml   Filed Weights   11/04/23 0226  Weight: 94 kg    Examination:  General exam: Appears calm and comfortable, obese Respiratory system: Clear to auscultation. Respiratory effort normal. Cardiovascular system: S1 & S2 heard, RRR.  Gastrointestinal system: Abdomen is soft, nondistended and tender to palpation Central nervous system: Alert and awake Extremities: No edema Skin: No significant lesions noted Psychiatry: Flat affect.    Data Reviewed:  I have personally reviewed following labs and imaging studies  CBC: Recent Labs  Lab 11/04/23 0233  WBC 10.6*  HGB 11.6*  HCT 33.1*  MCV 114.9*  PLT 133*   Basic Metabolic Panel: Recent Labs  Lab 11/04/23 0233  NA 138  K 3.5  CL 103  CO2 20*  GLUCOSE 194*  BUN 15   CREATININE 0.98  CALCIUM  9.6  MG 1.5*   GFR: Estimated Creatinine Clearance: 69.3 mL/min (by C-G formula based on SCr of 0.98 mg/dL). Liver Function Tests: Recent Labs  Lab 11/04/23 0233  AST 25  ALT 13  ALKPHOS 58  BILITOT 1.9*  PROT 7.0  ALBUMIN 3.7   Recent Labs  Lab 11/04/23 0233  LIPASE 26   No results for input(s): "AMMONIA" in the last 168 hours. Coagulation Profile: No results for input(s): "INR", "PROTIME" in the last 168 hours. Cardiac Enzymes: No results for input(s): "CKTOTAL", "CKMB", "CKMBINDEX", "TROPONINI" in the last 168 hours. BNP (last 3 results) No results for input(s): "PROBNP" in the last 8760 hours. HbA1C: No results for input(s): "HGBA1C" in the last 72 hours. CBG: Recent Labs  Lab 11/04/23 0722  GLUCAP 82   Lipid Profile: No results for input(s): "CHOL", "HDL", "LDLCALC", "TRIG", "CHOLHDL", "LDLDIRECT" in the last 72 hours. Thyroid  Function Tests: No results for input(s): "TSH", "T4TOTAL", "FREET4", "T3FREE", "THYROIDAB" in the last 72 hours. Anemia Panel: Recent Labs    11/04/23 0233  VITAMINB12 189  FOLATE 17.0   Sepsis Labs: No results for input(s): "PROCALCITON", "LATICACIDVEN" in the last 168 hours.  No results found for this or any previous visit (from the past 240 hours).       Radiology Studies: CT ABDOMEN PELVIS W CONTRAST Result Date: 11/04/2023 EXAM: CT ABDOMEN AND PELVIS WITH CONTRAST 11/04/2023 03:20:52 AM TECHNIQUE: CT of the abdomen and pelvis was performed with the administration of 100mL iohexol  (OMNIPAQUE ) 300 MG/ML solution. Multiplanar reformatted images are provided for review. Automated exposure control, iterative reconstruction, and/or weight based adjustment of the mA/kV was utilized to reduce the radiation dose to as low as reasonably achievable. COMPARISON: 10/24/2023 CLINICAL HISTORY: Bowel obstruction suspected; history of peritoneal carcinomatosis, hernia and gall stones. Vomiting and diarrhea and  feeling of fullness in abdomen at all times. Patient is a cancer patient and has been seen for this in the past by her cancer doctor but feels as if something else is "going on". FINDINGS: LOWER CHEST: No acute abnormality. LIVER: The liver is unremarkable. GALLBLADDER AND BILE DUCTS: Cholelithiasis, without associated inflammatory changes. SPLEEN: No acute abnormality. PANCREAS: No acute abnormality. ADRENAL GLANDS: No acute abnormality. KIDNEYS, URETERS AND BLADDER: 2.6 cm simple left upper pole renal cyst (image 36), benign (Bosniak 1). No follow-up is recommended. No stones in the kidneys or ureters. No hydronephrosis. No perinephric or periureteral stranding. Urinary bladder is unremarkable. GI AND BOWEL: Small right paramidline periumbilical ventral hernia containing a knuckle of mildly dilated small bowel with interval fluid (image 61). Dilated small bowel mildly progressive with narrowing/transition of the distal ileum (image 67), possibly on the basis of adhesions. No appendicitis. PERITONEUM AND RETROPERITONEUM: Vague 11 mm peritoneal nodule beneath the right mid anterior abdominal wall (image 41), equivocal, although warranting attention on follow-up to exclude a peritoneal implant. No omental caking. Very mild mesenteric stranding/fluid in the right mid abdomen (image 50). No free air. VASCULATURE: Aorta is normal in caliber. LYMPH NODES: No lymphadenopathy. REPRODUCTIVE ORGANS: No acute abnormality. BONES AND SOFT TISSUES: Mild degenerative changes of the lower thoracic  spine. No acute osseous abnormality. No focal soft tissue abnormality. IMPRESSION: 1. Suspected small bowel obstruction with narrowing/transition of the distal ileum, possibly on the basis of adhesions. 2. Small right paramidline periumbilical ventral hernia a loop of small bowel, now with interval fluid. Correlate for reducibility. 3. Vague 11 mm peritoneal nodule beneath the right mid anterior abdominal wall, equivocal. Attention on  follow-up is suggested. 4. Cholelithiasis without associated inflammatory changes. Electronically signed by: Zadie Herter MD 11/04/2023 03:33 AM EDT RP Workstation: [id number]        Scheduled Meds:  enoxaparin  (LOVENOX ) injection  95 mg Subcutaneous Q12H   insulin  aspart  0-9 Units Subcutaneous Q4H   Continuous Infusions:  lactated ringers  100 mL/hr at 11/04/23 0757     LOS: 0 days    Time spent: 55 minutes    Portia Wisdom D Mason Sole, DO [facility]  If 7PM-7AM, please contact night-coverage [email or web address] 11/04/2023, 10:13 AM

## 2023-11-04 NOTE — ED Notes (Signed)
 NG tube 60 at the tip of nose

## 2023-11-04 NOTE — Progress Notes (Signed)
 Transition of Care Department [facility]) has reviewed patient and no other TOC needs have been identified at this time. We will continue to monitor patient advancement through interdisciplinary progression rounds. If new patient transition needs arise, please place a TOC consult.   11/04/23 0850  TOC Brief Assessment  Insurance and Status Reviewed  Patient has primary care physician Yes  Home environment has been reviewed Lives with husband, daughter, and grandchildren.  Prior level of function: Independent.  Prior/Current Home Services No current home services  Social Drivers of Health Review SDOH reviewed no interventions necessary  Readmission risk has been reviewed Yes  Transition of care needs no transition of care needs at this time

## 2023-11-05 ENCOUNTER — Inpatient Hospital Stay

## 2023-11-05 ENCOUNTER — Inpatient Hospital Stay: Admitting: Hematology

## 2023-11-05 ENCOUNTER — Inpatient Hospital Stay (HOSPITAL_COMMUNITY)

## 2023-11-05 DIAGNOSIS — I2782 Chronic pulmonary embolism: Secondary | ICD-10-CM

## 2023-11-05 DIAGNOSIS — R1084 Generalized abdominal pain: Secondary | ICD-10-CM | POA: Diagnosis not present

## 2023-11-05 DIAGNOSIS — C569 Malignant neoplasm of unspecified ovary: Secondary | ICD-10-CM

## 2023-11-05 DIAGNOSIS — K56609 Unspecified intestinal obstruction, unspecified as to partial versus complete obstruction: Secondary | ICD-10-CM | POA: Diagnosis not present

## 2023-11-05 LAB — COMPREHENSIVE METABOLIC PANEL WITH GFR
ALT: 11 U/L (ref 0–44)
AST: 18 U/L (ref 15–41)
Albumin: 3 g/dL — ABNORMAL LOW (ref 3.5–5.0)
Alkaline Phosphatase: 47 U/L (ref 38–126)
Anion gap: 6 (ref 5–15)
BUN: 11 mg/dL (ref 6–20)
CO2: 25 mmol/L (ref 22–32)
Calcium: 8.3 mg/dL — ABNORMAL LOW (ref 8.9–10.3)
Chloride: 105 mmol/L (ref 98–111)
Creatinine, Ser: 0.71 mg/dL (ref 0.44–1.00)
GFR, Estimated: >60 mL/min (ref >60)
Glucose, Bld: 93 mg/dL (ref 70–99)
Potassium: 3.5 mmol/L (ref 3.5–5.1)
Sodium: 136 mmol/L (ref 135–145)
Total Bilirubin: 1.4 mg/dL — ABNORMAL HIGH (ref 0.0–1.2)
Total Protein: 5.9 g/dL — ABNORMAL LOW (ref 6.5–8.1)

## 2023-11-05 LAB — CBC
HCT: 28.2 % — ABNORMAL LOW (ref 36.0–46.0)
Hemoglobin: 9.4 g/dL — ABNORMAL LOW (ref 12.0–15.0)
MCH: 40.3 pg — ABNORMAL HIGH (ref 26.0–34.0)
MCHC: 33.3 g/dL (ref 30.0–36.0)
MCV: 121 fL — ABNORMAL HIGH (ref 80.0–100.0)
Platelets: 126 10*3/uL — ABNORMAL LOW (ref 150–400)
RBC: 2.33 MIL/uL — ABNORMAL LOW (ref 3.87–5.11)
RDW: 18.1 % — ABNORMAL HIGH (ref 11.5–15.5)
WBC: 4.9 10*3/uL (ref 4.0–10.5)
nRBC: 1.6 % — ABNORMAL HIGH (ref 0.0–0.2)

## 2023-11-05 LAB — MAGNESIUM: Magnesium: 2 mg/dL (ref 1.7–2.4)

## 2023-11-05 LAB — GLUCOSE, CAPILLARY
Glucose-Capillary: 105 mg/dL — ABNORMAL HIGH (ref 70–99)
Glucose-Capillary: 73 mg/dL (ref 70–99)
Glucose-Capillary: 74 mg/dL (ref 70–99)
Glucose-Capillary: 75 mg/dL (ref 70–99)
Glucose-Capillary: 92 mg/dL (ref 70–99)
Glucose-Capillary: 98 mg/dL (ref 70–99)

## 2023-11-05 LAB — HIV ANTIBODY (ROUTINE TESTING W REFLEX): HIV Screen 4th Generation wRfx: NONREACTIVE

## 2023-11-05 LAB — PHOSPHORUS: Phosphorus: 3.7 mg/dL (ref 2.5–4.6)

## 2023-11-05 MED ORDER — ENOXAPARIN SODIUM 100 MG/ML IJ SOSY
90.0000 mg | PREFILLED_SYRINGE | Freq: Two times a day (BID) | INTRAMUSCULAR | Status: DC
  Administered 2023-11-05 – 2023-11-06 (×3): 90 mg via SUBCUTANEOUS
  Filled 2023-11-05 (×2): qty 1

## 2023-11-05 MED ORDER — DIATRIZOATE MEGLUMINE & SODIUM 66-10 % PO SOLN
90.0000 mL | Freq: Once | ORAL | Status: AC
  Administered 2023-11-05: 90 mL via NASOGASTRIC
  Filled 2023-11-05: qty 90

## 2023-11-05 MED ORDER — CHLORHEXIDINE GLUCONATE CLOTH 2 % EX PADS
6.0000 | MEDICATED_PAD | Freq: Every day | CUTANEOUS | Status: DC
  Administered 2023-11-05 – 2023-11-07 (×3): 6 via TOPICAL

## 2023-11-05 MED ORDER — LACTATED RINGERS IV SOLN: INTRAVENOUS | Status: AC

## 2023-11-05 NOTE — Consult Note (Signed)
 [facility] Consultation Oncology  Name: Nicole Santos      MRN: [id number]    Location: A301/A301-01  Date: 11/05/2023 Time:8:11 AM   REFERRING PHYSICIAN: Dr. Mason Sole  REASON FOR CONSULT: High-grade serous carcinoma of the peritoneum   DIAGNOSIS: Small bowel obstruction  HISTORY OF PRESENT ILLNESS: Nicole Santos is 53 year old female who is known to me from office visits.  She was diagnosed with high-grade serous carcinoma of the peritoneum/omentum in June 2023, treated with neoadjuvant chemotherapy with 4 cycles of carboplatin  and paclitaxel  followed by debulking surgery on 10/01/2022.  She was placed on maintenance olaparib  and bevacizumab  started on 01/07/2023.  Last CT scan on 10/24/2023 of the abdomen and pelvis showed no convincing evidence of active malignancy.  She presented to the ER on 11/04/2023 with vomiting and diarrhea and abdominal pain.  Repeat CT scan on 11/04/2023 showed suspected small bowel obstruction with narrowing and transition of the distal ileum, possibly due to adhesions.  She was evaluated by surgery and conservative management was recommended.  Last bevacizumab  infusion was on 10/14/2023.  PAST MEDICAL HISTORY:   Past Medical History:  Diagnosis Date   Acute pulmonary embolism (HCC) 03/06/2022   Acute respiratory disease due to COVID-19 virus 02/02/2019   Anxiety    Cancer (HCC)    Depression    Dyspnea    Dysrhythmia    Family history of bladder cancer    Family history of breast cancer    History of diabetes mellitus    Resolved in Sept 2023 with weight loss.   Hypertension    Ovarian cancer (HCC) 11/14/2021   Peritoneal carcinomatosis (HCC)    Pneumonia    Sleep apnea    doesn't use CPAP   Tachycardia     ALLERGIES: Allergies  Allergen Reactions   Augmentin  [Amoxicillin -Pot Clavulanate] Diarrhea and Nausea And Vomiting   Doxycycline     Abdominal cramps.   Topamax  [Topiramate ] Nausea And Vomiting      MEDICATIONS: I have reviewed the patient's current  medications.     PAST SURGICAL HISTORY Past Surgical History:  Procedure Laterality Date   KNEE SURGERY Right    Arthroscopic   LAPAROSCOPIC ABDOMINAL EXPLORATION N/A 11/13/2021   Procedure: Exploratory laparoscopy;  Surgeon: Alanda Allegra, MD;  Location: [facility];  Service: General;  Laterality: N/A;   PORTACATH PLACEMENT Left 11/15/2021   Procedure: INSERTION PORT-A-CATH;  Surgeon: Alanda Allegra, MD;  Location: [facility];  Service: General;  Laterality: Left;   TONSILLECTOMY AND ADENOIDECTOMY      FAMILY HISTORY: Family History  Problem Relation Age of Onset   Hypertension Mother    Heart attack Paternal Uncle    Bladder Cancer Maternal Grandmother 2       non-smoker   Dementia Maternal Grandfather    Breast cancer Maternal Great-grandmother        MGF's mother   Colon cancer Neg Hx    Ovarian cancer Neg Hx    Endometrial cancer Neg Hx    Pancreatic cancer Neg Hx    Prostate cancer Neg Hx     SOCIAL HISTORY:  reports that she has never smoked. She has never used smokeless tobacco. She reports that she does not drink alcohol and does not use drugs.  PERFORMANCE STATUS: The patient's performance status is 1 - Symptomatic but completely ambulatory  PHYSICAL EXAM: Most Recent Vital Signs: Blood pressure (!) 149/79, pulse 99, temperature 99.4 F (37.4 C), temperature source Oral, resp. rate 18, height 5\' 2"  (1.575 m),  weight 196 lb 10.4 oz (89.2 kg), SpO2 94%. BP 129/70 (BP Location: Right Arm)   Pulse 87   Temp 98.5 F (36.9 C) (Oral)   Resp 18   Ht 5\' 2"  (1.575 m)   Wt 196 lb 10.4 oz (89.2 kg)   SpO2 97%   BMI 35.97 kg/m  General appearance: alert, cooperative, and appears stated age Lungs: clear to auscultation bilaterally Heart: regular rate and rhythm Abdomen: Soft, distended, bowel sounds present.  LABORATORY DATA:  Results for orders placed or performed during the hospital encounter of 11/04/23 (from the past 48 hours)  Lipase, blood     Status: None    Collection Time: 11/04/23  2:33 AM  Result Value Ref Range   Lipase 26 11 - 51 U/L    Comment: Performed at [facility], [address], [place], [name] [address]  Comprehensive metabolic panel     Status: Abnormal   Collection Time: 11/04/23  2:33 AM  Result Value Ref Range   Sodium 138 135 - 145 mmol/L   Potassium 3.5 3.5 - 5.1 mmol/L   Chloride 103 98 - 111 mmol/L   CO2 20 (L) 22 - 32 mmol/L   Glucose, Bld 194 (H) 70 - 99 mg/dL    Comment: Glucose reference range applies only to samples taken after fasting for at least 8 hours.   BUN 15 6 - 20 mg/dL   Creatinine, Ser 6.04 0.44 - 1.00 mg/dL   Calcium  9.6 8.9 - 10.3 mg/dL   Total Protein 7.0 6.5 - 8.1 g/dL   Albumin 3.7 3.5 - 5.0 g/dL   AST 25 15 - 41 U/L   ALT 13 0 - 44 U/L   Alkaline Phosphatase 58 38 - 126 U/L   Total Bilirubin 1.9 (H) 0.0 - 1.2 mg/dL   GFR, Estimated >54 >09 mL/min    Comment: (NOTE) Calculated using the CKD-EPI Creatinine Equation (2021)    Anion gap 15 5 - 15    Comment: Performed at [facility], [address], [place], [name] [address]  CBC     Status: Abnormal   Collection Time: 11/04/23  2:33 AM  Result Value Ref Range   WBC 10.6 (H) 4.0 - 10.5 K/uL   RBC 2.88 (L) 3.87 - 5.11 MIL/uL   Hemoglobin 11.6 (L) 12.0 - 15.0 g/dL   HCT 47.8 (L) 29.5 - 62.1 %   MCV 114.9 (H) 80.0 - 100.0 fL   MCH 40.3 (H) 26.0 - 34.0 pg   MCHC 35.0 30.0 - 36.0 g/dL   RDW 30.8 (H) 65.7 - 84.6 %   Platelets 133 (L) 150 - 400 K/uL   nRBC 1.4 (H) 0.0 - 0.2 %    Comment: Performed at [facility], [address], [place], [name] [address]  Magnesium      Status: Abnormal   Collection Time: 11/04/23  2:33 AM  Result Value Ref Range   Magnesium  1.5 (L) 1.7 - 2.4 mg/dL    Comment: Performed at [facility], [address], [place], [name] [address]  Vitamin B12     Status: None   Collection Time: 11/04/23  2:33 AM  Result Value Ref Range   Vitamin B-12 189 180 - 914 pg/mL    Comment: (NOTE) This assay is not validated  for testing neonatal or myeloproliferative syndrome specimens for Vitamin B12 levels. Performed at [facility], [address], [place], [name] [address]   Hemoglobin A1c     Status: None   Collection  Time: 11/04/23  2:33 AM  Result Value Ref Range   Hgb A1c MFr Bld 5.3 4.8 - 5.6 %    Comment: (NOTE) Diagnosis of Diabetes The following HbA1c ranges recommended by the American Diabetes Association (ADA) may be used as an aid in the diagnosis of diabetes mellitus.  Hemoglobin             Suggested A1C NGSP%              Diagnosis  <5.7                   Non Diabetic  5.7-6.4                Pre-Diabetic  >6.4                   Diabetic  <7.0                   Glycemic control for                       adults with diabetes.     Mean Plasma Glucose 105.41 mg/dL    Comment: Performed at [facility], [address], [place], [name] [address]  Folate     Status: None   Collection Time: 11/04/23  2:33 AM  Result Value Ref Range   Folate 17.0 >5.9 ng/mL    Comment: Performed at [facility], [address], [place], [name] [address]  Urinalysis, Routine w reflex microscopic -Urine, Clean Catch     Status: Abnormal   Collection Time: 11/04/23  5:11 AM  Result Value Ref Range   Color, Urine YELLOW YELLOW   APPearance CLEAR CLEAR   Specific Gravity, Urine RESULTS UNAVAILABLE DUE TO INTERFERING SUBSTANCE 1.005 - 1.030   pH 8.0 5.0 - 8.0   Glucose, UA NEGATIVE NEGATIVE mg/dL   Hgb urine dipstick NEGATIVE NEGATIVE   Bilirubin Urine NEGATIVE NEGATIVE   Ketones, ur 5 (A) NEGATIVE mg/dL   Protein, ur 30 (A) NEGATIVE mg/dL   Nitrite NEGATIVE NEGATIVE   Leukocytes,Ua TRACE (A) NEGATIVE   RBC / HPF 0-5 0 - 5 RBC/hpf   WBC, UA 11-20 0 - 5 WBC/hpf   Bacteria, UA NONE SEEN NONE SEEN   Squamous Epithelial / HPF 0-5 0 - 5 /HPF   Mucus PRESENT     Comment: Performed at [facility], [address] [place], [name] [address]  CBG monitoring, ED     Status: None   Collection Time:  11/04/23  7:22 AM  Result Value Ref Range   Glucose-Capillary 82 70 - 99 mg/dL    Comment: Glucose reference range applies only to samples taken after fasting for at least 8 hours.  CBG monitoring, ED     Status: Abnormal   Collection Time: 11/04/23 12:25 PM  Result Value Ref Range   Glucose-Capillary 105 (H) 70 - 99 mg/dL    Comment: Glucose reference range applies only to samples taken after fasting for at least 8 hours.  Glucose, capillary     Status: Abnormal   Collection Time: 11/04/23  4:13 PM  Result Value Ref Range   Glucose-Capillary 106 (H) 70 - 99 mg/dL    Comment: Glucose reference range applies only to samples taken after fasting for at least 8 hours.   Comment 1 Notify RN    Comment 2 Document in Chart   Glucose, capillary     Status: None  Collection Time: 11/04/23  7:52 PM  Result Value Ref Range   Glucose-Capillary 96 70 - 99 mg/dL    Comment: Glucose reference range applies only to samples taken after fasting for at least 8 hours.   Comment 1 Notify RN    Comment 2 Document in Chart   Glucose, capillary     Status: None   Collection Time: 11/04/23 11:42 PM  Result Value Ref Range   Glucose-Capillary 90 70 - 99 mg/dL    Comment: Glucose reference range applies only to samples taken after fasting for at least 8 hours.  Glucose, capillary     Status: Abnormal   Collection Time: 11/05/23  3:51 AM  Result Value Ref Range   Glucose-Capillary 105 (H) 70 - 99 mg/dL    Comment: Glucose reference range applies only to samples taken after fasting for at least 8 hours.  Comprehensive metabolic panel     Status: Abnormal   Collection Time: 11/05/23  3:59 AM  Result Value Ref Range   Sodium 136 135 - 145 mmol/L   Potassium 3.5 3.5 - 5.1 mmol/L   Chloride 105 98 - 111 mmol/L   CO2 25 22 - 32 mmol/L   Glucose, Bld 93 70 - 99 mg/dL    Comment: Glucose reference range applies only to samples taken after fasting for at least 8 hours.   BUN 11 6 - 20 mg/dL   Creatinine, Ser  1.61 0.44 - 1.00 mg/dL   Calcium  8.3 (L) 8.9 - 10.3 mg/dL   Total Protein 5.9 (L) 6.5 - 8.1 g/dL   Albumin 3.0 (L) 3.5 - 5.0 g/dL   AST 18 15 - 41 U/L   ALT 11 0 - 44 U/L   Alkaline Phosphatase 47 38 - 126 U/L   Total Bilirubin 1.4 (H) 0.0 - 1.2 mg/dL   GFR, Estimated >09 >60 mL/min    Comment: (NOTE) Calculated using the CKD-EPI Creatinine Equation (2021)    Anion gap 6 5 - 15    Comment: Performed at [facility], [address], [place], [name] [address]  CBC     Status: Abnormal   Collection Time: 11/05/23  3:59 AM  Result Value Ref Range   WBC 4.9 4.0 - 10.5 K/uL   RBC 2.33 (L) 3.87 - 5.11 MIL/uL   Hemoglobin 9.4 (L) 12.0 - 15.0 g/dL   HCT 81.1 (L) 91.4 - 78.2 %   MCV 121.0 (H) 80.0 - 100.0 fL   MCH 40.3 (H) 26.0 - 34.0 pg   MCHC 33.3 30.0 - 36.0 g/dL   RDW 95.6 (H) 21.3 - 08.6 %   Platelets 126 (L) 150 - 400 K/uL   nRBC 1.6 (H) 0.0 - 0.2 %    Comment: Performed at [facility], [address], [place], [name] [address]  Magnesium      Status: None   Collection Time: 11/05/23  3:59 AM  Result Value Ref Range   Magnesium  2.0 1.7 - 2.4 mg/dL    Comment: Performed at [facility], [address] [place], [name] [address]  Phosphorus     Status: None   Collection Time: 11/05/23  3:59 AM  Result Value Ref Range   Phosphorus 3.7 2.5 - 4.6 mg/dL    Comment: Performed at [facility], [address], [place], [name] [address]  Glucose, capillary     Status: None   Collection Time: 11/05/23  7:07 AM  Result Value Ref Range   Glucose-Capillary 98 70 - 99 mg/dL  Comment: Glucose reference range applies only to samples taken after fasting for at least 8 hours.   Comment 1 Notify RN    Comment 2 Document in Chart       RADIOGRAPHY: DG Abd Portable 1 View Result Date: 11/04/2023 CLINICAL DATA:  NG tube placement EXAM: PORTABLE ABDOMEN - 1 VIEW COMPARISON:  11/03/2021 FINDINGS: NG tube tip in the stomach. Mildly dilated small bowel loops noted. Lung bases clear. IMPRESSION: NG  tube tip in the stomach. Electronically Signed   By: Janeece Mechanic M.D.   On: 11/04/2023 12:55   CT ABDOMEN PELVIS W CONTRAST Result Date: 11/04/2023 EXAM: CT ABDOMEN AND PELVIS WITH CONTRAST 11/04/2023 03:20:52 AM TECHNIQUE: CT of the abdomen and pelvis was performed with the administration of iohexol  (OMNIPAQUE ) 300 MG/ML solution. Multiplanar reformatted images are provided for review. Automated exposure control, iterative reconstruction, and/or weight based adjustment of the mA/kV was utilized to reduce the radiation dose to as low as reasonably achievable. COMPARISON: 10/24/2023 CLINICAL HISTORY: Bowel obstruction suspected; history of peritoneal carcinomatosis, hernia and gall stones. Vomiting and diarrhea and feeling of fullness in abdomen at all times. Patient is a cancer patient and has been seen for this in the past by her cancer doctor but feels as if something else is "going on". FINDINGS: LOWER CHEST: No acute abnormality. LIVER: The liver is unremarkable. GALLBLADDER AND BILE DUCTS: Cholelithiasis, without associated inflammatory changes. SPLEEN: No acute abnormality. PANCREAS: No acute abnormality. ADRENAL GLANDS: No acute abnormality. KIDNEYS, URETERS AND BLADDER: 2.6 cm simple left upper pole renal cyst (image 36), benign (Bosniak 1). No follow-up is recommended. No stones in the kidneys or ureters. No hydronephrosis. No perinephric or periureteral stranding. Urinary bladder is unremarkable. GI AND BOWEL: Small right paramidline periumbilical ventral hernia containing a knuckle of mildly dilated small bowel with interval fluid (image 61). Dilated small bowel mildly progressive with narrowing/transition of the distal ileum (image 67), possibly on the basis of adhesions. No appendicitis. PERITONEUM AND RETROPERITONEUM: Vague 11 mm peritoneal nodule beneath the right mid anterior abdominal wall (image 41), equivocal, although warranting attention on follow-up to exclude a peritoneal implant. No  omental caking. Very mild mesenteric stranding/fluid in the right mid abdomen (image 50). No free air. VASCULATURE: Aorta is normal in caliber. LYMPH NODES: No lymphadenopathy. REPRODUCTIVE ORGANS: No acute abnormality. BONES AND SOFT TISSUES: Mild degenerative changes of the lower thoracic spine. No acute osseous abnormality. No focal soft tissue abnormality. IMPRESSION: 1. Suspected small bowel obstruction with narrowing/transition of the distal ileum, possibly on the basis of adhesions. 2. Small right paramidline periumbilical ventral hernia a loop of small bowel, now with interval fluid. Correlate for reducibility. 3. Vague 11 mm peritoneal nodule beneath the right mid anterior abdominal wall, equivocal. Attention on follow-up is suggested. 4. Cholelithiasis without associated inflammatory changes. Electronically signed by: Zadie Herter MD 11/04/2023 03:33 AM EDT RP Workstation: [id number]       ASSESSMENT and PLAN:  1.  High-grade serous carcinoma of the peritoneum: - On maintenance Lynparza  and bevacizumab .  Last bevacizumab  on 10/14/2023. - CTAP (10/24/2023): No convincing evidence of active malignancy. - Presentation to the ER with abdominal pain, vomiting and diarrhea. - CTAP (11/04/2023): Suspected small bowel obstruction with narrowing and transition of the distal ileum, possibly due to adhesions. - She was evaluated by surgery and conservative management was recommended. - Will continue n.p.o./NG tube to low intermittent suction/IV hydration/Dulcolax suppository. - Continue IV morphine  2 mg every 4 hours as needed for abdominal pain. -  Continue to hold Lynparza . - Discussed with Dr. Mason Sole.  All questions were answered. The patient knows to call the clinic with any problems, questions or concerns. We can certainly see the patient much sooner if necessary.    Tisa Weisel

## 2023-11-05 NOTE — Progress Notes (Signed)
 [facility] Progress Note     Subjective: Patient seen and examined.  She is resting comfortably in bed.  She denies any nausea or vomiting.  She also denies abdominal pain.  She had 2 small episodes of flatus today and had a bowel movement yesterday evening and a moderate-sized bowel movement this morning.  NG tube remains in place with 300 cc in the canister.  Objective: Vital signs in last 24 hours: Temp:  [97.9 F (36.6 C)-99.4 F (37.4 C)] 99.4 F (37.4 C) (06/03 0349) Pulse Rate:  [99-110] 99 (06/03 0349) Resp:  [18-21] 18 (06/03 0349) BP: (127-149)/(56-79) 149/79 (06/03 0349) SpO2:  [93 %-99 %] 94 % (06/03 0349) Weight:  [89.2 kg] 89.2 kg (06/02 1509) Last BM Date : 11/04/23  Intake/Output from previous day: 06/02 0701 - 06/03 0700 In: 279.6 [I.V.:232.5; IV Piggyback:47.1] Out: 200 [Emesis/NG output:200] Intake/Output this shift: No intake/output data recorded.  General appearance: alert, cooperative, and no distress Nose: NG tube in place with bilious output in canister. GI: Abdomen soft, nondistended, no percussion tenderness, nontender to palpation; no rigidity, guarding, rebound tenderness; location of ventral hernia is soft and reducible, nontender  Lab Results:  Recent Labs    11/04/23 0233 11/05/23 0359  WBC 10.6* 4.9  HGB 11.6* 9.4*  HCT 33.1* 28.2*  PLT 133* 126*   BMET Recent Labs    11/04/23 0233 11/05/23 0359  NA 138 136  K 3.5 3.5  CL 103 105  CO2 20* 25  GLUCOSE 194* 93  BUN 15 11  CREATININE 0.98 0.71  CALCIUM  9.6 8.3*   PT/INR No results for input(s): "LABPROT", "INR" in the last 72 hours.  Studies/Results: DG Abd 1 View Result Date: 11/05/2023 CLINICAL DATA:  Small bowel obstruction. EXAM: ABDOMEN - 1 VIEW COMPARISON:  11/04/2023 and CT abdomen pelvis 11/04/2023 FINDINGS: Nasogastric tube is in the stomach body region. Evidence for calcified gallstones. There continues to be dilated loops of bowel in the abdomen and  no significant gas in the rectum. Limited evaluation for free air on this supine examination. IMPRESSION: 1. Persistent dilated loops of bowel in the abdomen. Findings remain compatible with small bowel obstruction. Minimal change from the CT topogram on 11/04/2023. 2. Nasogastric tube is in the stomach body region. 3. Cholelithiasis. Electronically Signed   By: Elene Griffes M.D.   On: 11/05/2023 11:36   DG Abd Portable 1 View Result Date: 11/04/2023 CLINICAL DATA:  NG tube placement EXAM: PORTABLE ABDOMEN - 1 VIEW COMPARISON:  11/03/2021 FINDINGS: NG tube tip in the stomach. Mildly dilated small bowel loops noted. Lung bases clear. IMPRESSION: NG tube tip in the stomach. Electronically Signed   By: Janeece Mechanic M.D.   On: 11/04/2023 12:55   CT ABDOMEN PELVIS W CONTRAST Result Date: 11/04/2023 EXAM: CT ABDOMEN AND PELVIS WITH CONTRAST 11/04/2023 03:20:52 AM TECHNIQUE: CT of the abdomen and pelvis was performed with the administration of 100mL iohexol  (OMNIPAQUE ) 300 MG/ML solution. Multiplanar reformatted images are provided for review. Automated exposure control, iterative reconstruction, and/or weight based adjustment of the mA/kV was utilized to reduce the radiation dose to as low as reasonably achievable. COMPARISON: 10/24/2023 CLINICAL HISTORY: Bowel obstruction suspected; history of peritoneal carcinomatosis, hernia and gall stones. Vomiting and diarrhea and feeling of fullness in abdomen at all times. Patient is a cancer patient and has been seen for this in the past by her cancer doctor but feels as if something else is "going on". FINDINGS: LOWER CHEST: No acute abnormality. LIVER:  The liver is unremarkable. GALLBLADDER AND BILE DUCTS: Cholelithiasis, without associated inflammatory changes. SPLEEN: No acute abnormality. PANCREAS: No acute abnormality. ADRENAL GLANDS: No acute abnormality. KIDNEYS, URETERS AND BLADDER: 2.6 cm simple left upper pole renal cyst (image 36), benign (Bosniak 1). No follow-up  is recommended. No stones in the kidneys or ureters. No hydronephrosis. No perinephric or periureteral stranding. Urinary bladder is unremarkable. GI AND BOWEL: Small right paramidline periumbilical ventral hernia containing a knuckle of mildly dilated small bowel with interval fluid (image 61). Dilated small bowel mildly progressive with narrowing/transition of the distal ileum (image 67), possibly on the basis of adhesions. No appendicitis. PERITONEUM AND RETROPERITONEUM: Vague 11 mm peritoneal nodule beneath the right mid anterior abdominal wall (image 41), equivocal, although warranting attention on follow-up to exclude a peritoneal implant. No omental caking. Very mild mesenteric stranding/fluid in the right mid abdomen (image 50). No free air. VASCULATURE: Aorta is normal in caliber. LYMPH NODES: No lymphadenopathy. REPRODUCTIVE ORGANS: No acute abnormality. BONES AND SOFT TISSUES: Mild degenerative changes of the lower thoracic spine. No acute osseous abnormality. No focal soft tissue abnormality. IMPRESSION: 1. Suspected small bowel obstruction with narrowing/transition of the distal ileum, possibly on the basis of adhesions. 2. Small right paramidline periumbilical ventral hernia a loop of small bowel, now with interval fluid. Correlate for reducibility. 3. Vague 11 mm peritoneal nodule beneath the right mid anterior abdominal wall, equivocal. Attention on follow-up is suggested. 4. Cholelithiasis without associated inflammatory changes. Electronically signed by: Zadie Herter MD 11/04/2023 03:33 AM EDT RP Workstation: [id number]    Anti-infectives: Anti-infectives (From admission, onward)    None       Assessment/Plan:  Patient is a 53 year old female who was admitted with a small bowel obstruction.  -While patient has had some resumption of bowel function, KUB this morning still demonstrates dilated loops of small bowel -Will plan to obtain a small bowel obstruction protocol  today -NPO, NG to LIS except when clamped for protocol -IV fluids per primary team -PRN pain control and antiemetics -Dulcolax suppository -Monitor for further bowel function -Further recommendations to follow imaging.  Discussed with the patient that I want to avoid surgery especially given that it is likely being caused by her peritoneal carcinomatosis -Appreciate oncology and palliative care recommendations - Appreciate hospitalist recommendations   LOS: 1 day    Shayla Heming A Henretter Piekarski 11/05/2023  Note: Portions of this report may have been transcribed using voice recognition software. Every effort has been made to ensure accuracy; however, inadvertent computerized transcription errors may still be present.

## 2023-11-05 NOTE — Plan of Care (Signed)
   Problem: Coping: Goal: Ability to adjust to condition or change in health will improve Outcome: Progressing   Problem: Fluid Volume: Goal: Ability to maintain a balanced intake and output will improve Outcome: Progressing   Problem: Health Behavior/Discharge Planning: Goal: Ability to identify and utilize available resources and services will improve Outcome: Progressing

## 2023-11-05 NOTE — Progress Notes (Signed)
 PROGRESS NOTE    [name]  ZOX:[id number] DOB: 1970/07/25 DOA: 11/04/2023 PCP: Eliodoro Guerin, DO   Brief Narrative:    Nicole Santos is a 53 y.o. female with medical history significant of High-grade serous carcinoma of peritoneum, peripheral neuropathy, pulmonary embolism, hyperlipidemia who presents to the emergency department due to diarrhea, diffuse abdominal pain, vomiting.  Patient has been admitted for evaluation of small bowel obstruction and general surgery consulted.  She is undergoing conservative measures and is not a candidate for surgical management.  Assessment & Plan:   Principal Problem:   Small bowel obstruction (HCC) Active Problems:   Mixed hyperlipidemia   Pulmonary embolism (HCC)   Abdominal pain   Ventral hernia   Macrocytic anemia   Hypomagnesemia   Prediabetes   Hyperglycemia   Peripheral neuropathy  Assessment and Plan:   Small bowel obstruction in the setting of adhesions with peritoneal carcinomatosis Continue NG tube to LIS Continue NPO at this time with plan to advance diet as tolerated Continue IV hydration with LR Continue IV morphine  2 mg q.4h p.r.n. for moderate/severe pain Continue Zofran  p.r.n. for nausea/vomiting Appreciate further general surgery recommendations   Abdominal pain possibly due to ventral hernia CT abdomen pelvis with contrast showed small l right paramidline periumbilical ventral hernia a loop of small bowel,now with interval fluid. General surgery will be consulted for reducibility/repair   Macrocytic anemia Remains stable with B12 and folate within normal limits   Prediabetes with hyperglycemia Hemoglobin A1c on 07/03/2023 was 5.7 Continue ISS and hypoglycemia protocol   Mixed hyperlipidemia Continue home meds when patient resumes oral intake   Pulmonary embolism Continue Lovenox  full dose Consider transitioning back to home Eliquis  when patient resumes oral intake   High-grade serous carcinoma of  peritoneum Patient follows with Dr. Katragadda, seen this a.m. Continue home meds when patient resumes oral intake   Peripheral neuropathy Continue home meds when patient resumes oral intake  Obesity, class II BMI 37.9   DVT prophylaxis: Full dose Lovenox  Code Status: Full Family Communication: None at bedside Disposition Plan:  Status is: Inpatient Remains inpatient appropriate because: Need for IV medications/fluids  Consultants:  General surgery Oncology Palliative  Procedures:  None  Antimicrobials:  None   Subjective: Patient seen and evaluated today with noted bowel movement yesterday, but no bowel movement this morning.  She is passing a little bit of flatus.  Denies any nausea or vomiting and would like to have NG tube removed if possible.  Objective: Vitals:   11/04/23 1145 11/04/23 1215 11/04/23 1509 11/05/23 0349  BP: 136/75 137/65 (!) 127/56 (!) 149/79  Pulse: 97 (!) 101 (!) 110 99  Resp: 18 (!) 21  18  Temp:   97.9 F (36.6 C) 99.4 F (37.4 C)  TempSrc:   Oral Oral  SpO2: 96% 93% 99% 94%  Weight:   89.2 kg   Height:   5\' 2"  (1.575 m)     Intake/Output Summary (Last 24 hours) at 11/05/2023 0957 Last data filed at 11/05/2023 8119 Gross per 24 hour  Intake 232.52 ml  Output 200 ml  Net 32.52 ml   Filed Weights   11/04/23 0226 11/04/23 1509  Weight: 94 kg 89.2 kg    Examination:  General exam: Appears calm and comfortable, obese Respiratory system: Clear to auscultation. Respiratory effort normal. Cardiovascular system: S1 & S2 heard, RRR.  Gastrointestinal system: Abdomen is soft, nondistended and tender to palpation, NG tube to LIS Central nervous system: Alert and awake  Extremities: No edema Skin: No significant lesions noted Psychiatry: Flat affect.    Data Reviewed: I have personally reviewed following labs and imaging studies  CBC: Recent Labs  Lab 11/04/23 0233 11/05/23 0359  WBC 10.6* 4.9  HGB 11.6* 9.4*  HCT 33.1* 28.2*   MCV 114.9* 121.0*  PLT 133* 126*   Basic Metabolic Panel: Recent Labs  Lab 11/04/23 0233 11/05/23 0359  NA 138 136  K 3.5 3.5  CL 103 105  CO2 20* 25  GLUCOSE 194* 93  BUN 15 11  CREATININE 0.98 0.71  CALCIUM  9.6 8.3*  MG 1.5* 2.0  PHOS  --  3.7   GFR: Estimated Creatinine Clearance: 82.4 mL/min (by C-G formula based on SCr of 0.71 mg/dL). Liver Function Tests: Recent Labs  Lab 11/04/23 0233 11/05/23 0359  AST 25 18  ALT 13 11  ALKPHOS 58 47  BILITOT 1.9* 1.4*  PROT 7.0 5.9*  ALBUMIN 3.7 3.0*   Recent Labs  Lab 11/04/23 0233  LIPASE 26   No results for input(s): "AMMONIA" in the last 168 hours. Coagulation Profile: No results for input(s): "INR", "PROTIME" in the last 168 hours. Cardiac Enzymes: No results for input(s): "CKTOTAL", "CKMB", "CKMBINDEX", "TROPONINI" in the last 168 hours. BNP (last 3 results) No results for input(s): "PROBNP" in the last 8760 hours. HbA1C: Recent Labs    11/04/23 0233  HGBA1C 5.3   CBG: Recent Labs  Lab 11/04/23 1613 11/04/23 1952 11/04/23 2342 11/05/23 0351 11/05/23 0707  GLUCAP 106* 96 90 105* 98   Lipid Profile: No results for input(s): "CHOL", "HDL", "LDLCALC", "TRIG", "CHOLHDL", "LDLDIRECT" in the last 72 hours. Thyroid  Function Tests: No results for input(s): "TSH", "T4TOTAL", "FREET4", "T3FREE", "THYROIDAB" in the last 72 hours. Anemia Panel: Recent Labs    11/04/23 0233  VITAMINB12 189  FOLATE 17.0   Sepsis Labs: No results for input(s): "PROCALCITON", "LATICACIDVEN" in the last 168 hours.  No results found for this or any previous visit (from the past 240 hours).       Radiology Studies: DG Abd Portable 1 View Result Date: 11/04/2023 CLINICAL DATA:  NG tube placement EXAM: PORTABLE ABDOMEN - 1 VIEW COMPARISON:  11/03/2021 FINDINGS: NG tube tip in the stomach. Mildly dilated small bowel loops noted. Lung bases clear. IMPRESSION: NG tube tip in the stomach. Electronically Signed   By: Janeece Mechanic M.D.   On: 11/04/2023 12:55   CT ABDOMEN PELVIS W CONTRAST Result Date: 11/04/2023 EXAM: CT ABDOMEN AND PELVIS WITH CONTRAST 11/04/2023 03:20:52 AM TECHNIQUE: CT of the abdomen and pelvis was performed with the administration of 100mL iohexol  (OMNIPAQUE ) 300 MG/ML solution. Multiplanar reformatted images are provided for review. Automated exposure control, iterative reconstruction, and/or weight based adjustment of the mA/kV was utilized to reduce the radiation dose to as low as reasonably achievable. COMPARISON: 10/24/2023 CLINICAL HISTORY: Bowel obstruction suspected; history of peritoneal carcinomatosis, hernia and gall stones. Vomiting and diarrhea and feeling of fullness in abdomen at all times. Patient is a cancer patient and has been seen for this in the past by her cancer doctor but feels as if something else is "going on". FINDINGS: LOWER CHEST: No acute abnormality. LIVER: The liver is unremarkable. GALLBLADDER AND BILE DUCTS: Cholelithiasis, without associated inflammatory changes. SPLEEN: No acute abnormality. PANCREAS: No acute abnormality. ADRENAL GLANDS: No acute abnormality. KIDNEYS, URETERS AND BLADDER: 2.6 cm simple left upper pole renal cyst (image 36), benign (Bosniak 1). No follow-up is recommended. No stones in the kidneys or ureters. No  hydronephrosis. No perinephric or periureteral stranding. Urinary bladder is unremarkable. GI AND BOWEL: Small right paramidline periumbilical ventral hernia containing a knuckle of mildly dilated small bowel with interval fluid (image 61). Dilated small bowel mildly progressive with narrowing/transition of the distal ileum (image 67), possibly on the basis of adhesions. No appendicitis. PERITONEUM AND RETROPERITONEUM: Vague 11 mm peritoneal nodule beneath the right mid anterior abdominal wall (image 41), equivocal, although warranting attention on follow-up to exclude a peritoneal implant. No omental caking. Very mild mesenteric stranding/fluid in the  right mid abdomen (image 50). No free air. VASCULATURE: Aorta is normal in caliber. LYMPH NODES: No lymphadenopathy. REPRODUCTIVE ORGANS: No acute abnormality. BONES AND SOFT TISSUES: Mild degenerative changes of the lower thoracic spine. No acute osseous abnormality. No focal soft tissue abnormality. IMPRESSION: 1. Suspected small bowel obstruction with narrowing/transition of the distal ileum, possibly on the basis of adhesions. 2. Small right paramidline periumbilical ventral hernia a loop of small bowel, now with interval fluid. Correlate for reducibility. 3. Vague 11 mm peritoneal nodule beneath the right mid anterior abdominal wall, equivocal. Attention on follow-up is suggested. 4. Cholelithiasis without associated inflammatory changes. Electronically signed by: Zadie Herter MD 11/04/2023 03:33 AM EDT RP Workstation: [id number]        Scheduled Meds:  bisacodyl   10 mg Rectal Daily   Chlorhexidine  Gluconate Cloth  6 each Topical Q0600   enoxaparin  (LOVENOX ) injection  90 mg Subcutaneous Q12H   insulin  aspart  0-9 Units Subcutaneous Q4H   Continuous Infusions:  lactated ringers        LOS: 1 day    Time spent: 55 minutes    Braelin Costlow Loran Rock, DO [facility]  If 7PM-7AM, please contact night-coverage [email or web address] 11/05/2023, 9:57 AM

## 2023-11-05 NOTE — Consult Note (Addendum)
 Consultation Note Date:  11/06/2023  Patient Name: Nicole Santos  DOB: 06/07/1970  MRN: [id number]  Age / Sex: 53 y.o., female  PCP: Nicole Norene HERO, DO Referring Physician: Maree Adron BIRCH, DO  Reason for Consultation: Establishing goals of care  HPI/Patient Profile: 53 y.o. female  with past medical history of stage IVa high-grade serous carcinoma of peritoneum, peripheral neuropathy, pulmonary embolism, HLD admitted on 11/04/2023 with abd pain, diarrhea, vomiting related to small bowel obstruction related to peritoneal carcinomatosis - not a good surgical candidate.   Clinical Assessment and Goals of Care: Consulted received and extensive chart review completed. I spoke with Dr. Evonnie and spoke with Dr. Maree yesterday. Nicole Santos appears to be having improvement without surgical intervention. She is sitting up in chair. Husband is at bedside. She looks very well and is feeling better. She is happy to have NGT out - feels this will help her to sleep better tonight. She is happy with her progress. We discussed hopes to avoid surgery but always the possible risk of this happening in the future. I encouraged Nicole Santos to consider and discuss with her family her wishes for surgery if this happens again in the future given the risks vs benefits.   Daughter and 2 grandsons arrive to bedside. Daughter expresses concern for bleeding. Nicole Santos shares that she has struggled with ulcers even from a young age. Reassured daughter that GI will be coming to see and evaluate and make recommendations. Discussed that we want to avoid any time of endoscopy/colonoscopy as she is recovering from bowel obstruction. All express understanding.   I discussed with Nicole Santos if she has ever considered completing or looking at Advance Directive. She shared that this has been offered to her many times and she has no desire to consider or  discuss. I encouraged that at some point this may be good to consider and does not mean something bad is going to happen but just to be prepared to help her family in the event something changes in the future. Nicole Santos is not interested. I expressed that I respect her wishes.   All questions/concerns addressed. Emotional support provided.   Primary Decision Maker PATIENT    SUMMARY OF RECOMMENDATIONS   - Improving - likely to go home in the coming days - I will sign off as patient has no desire for discussion at this time - Please re-consult in the future if patient desires support and conversation  Code Status/Advance Care Planning: Full code   Symptom Management:  Per attending  Discharge Planning: Home with Home Health      Primary Diagnoses: Present on Admission:  Small bowel obstruction (HCC)  Mixed hyperlipidemia  Pulmonary embolism (HCC)   I have reviewed the medical record, interviewed the patient and family, and examined the patient. The following aspects are pertinent.  Past Medical History:  Diagnosis Date   Acute pulmonary embolism (HCC) 03/06/2022   Acute respiratory disease due to COVID-19 virus 02/02/2019   Anxiety  Cancer [facility])    Depression    Dyspnea    Dysrhythmia    Family history of bladder cancer    Family history of breast cancer    History of diabetes mellitus    Resolved in Sept 2023 with weight loss.   Hypertension    Ovarian cancer (HCC) 11/14/2021   Peritoneal carcinomatosis (HCC)    Pneumonia    Sleep apnea    doesn't use CPAP   Tachycardia    Social History   Socioeconomic History   Marital status: Married    Spouse name: Not on file   Number of children: Not on file   Years of education: Not on file   Highest education level: Not on file  Occupational History   Occupation: Unemployed  Tobacco Use   Smoking status: Never   Smokeless tobacco: Never  Vaping Use   Vaping status: Never Used  Substance and Sexual  Activity   Alcohol use: No   Drug use: No   Sexual activity: Not Currently  Other Topics Concern   Not on file  Social History Narrative   Not on file   Social Drivers of Health   Financial Resource Strain: Not on file  Food Insecurity: No Food Insecurity (11/04/2023)   Hunger Vital Sign    Worried About Running Out of Food in the Last Year: Never true    Ran Out of Food in the Last Year: Never true  Transportation Needs: No Transportation Needs (11/04/2023)   PRAPARE - Administrator, Civil Service (Medical): No    Lack of Transportation (Non-Medical): No  Physical Activity: Not on file  Stress: Not on file  Social Connections: Not on file   Family History  Problem Relation Age of Onset   Hypertension Mother    Heart attack Paternal Uncle    Bladder Cancer Maternal Grandmother 30       non-smoker   Dementia Maternal Grandfather    Breast cancer Maternal Great-grandmother        MGF's mother   Colon cancer Neg Hx    Ovarian cancer Neg Hx    Endometrial cancer Neg Hx    Pancreatic cancer Neg Hx    Prostate cancer Neg Hx    Scheduled Meds:  bisacodyl   10 mg Rectal Daily   enoxaparin  (LOVENOX ) injection  95 mg Subcutaneous Q12H   insulin  aspart  0-9 Units Subcutaneous Q4H   Continuous Infusions: PRN Meds:.acetaminophen  **OR** acetaminophen , morphine  injection, ondansetron  **OR** ondansetron  (ZOFRAN ) IV Allergies  Allergen Reactions   Augmentin  [Amoxicillin -Pot Clavulanate] Diarrhea and Nausea And Vomiting   Doxycycline     Abdominal cramps.   Topamax  [Topiramate ] Nausea And Vomiting   Review of Systems  Gastrointestinal:  Positive for blood in stool and diarrhea.    Physical Exam Vitals and nursing note reviewed.  Constitutional:      General: She is not in acute distress.    Appearance: Normal appearance. She is well-developed and well-groomed.  Cardiovascular:     Rate and Rhythm: Normal rate.  Pulmonary:     Effort: No tachypnea, accessory  muscle usage or respiratory distress.  Abdominal:     Palpations: Abdomen is soft.  Neurological:     Mental Status: She is alert and oriented to person, place, and time.     Vital Signs: BP (!) 149/79 (BP Location: Right Arm)   Pulse 99   Temp 99.4 F (37.4 C) (Oral)   Resp 18   Ht 5' 2 (  1.575 m)   Wt 89.2 kg   SpO2 94%   BMI 35.97 kg/m  Pain Scale: 0-10   Pain Score: Asleep   SpO2: SpO2: 94 % O2 Device:SpO2: 94 % O2 Flow Rate: .   IO: Intake/output summary:  Intake/Output Summary (Last 24 hours) at 11/05/2023 0751 Last data filed at 11/05/2023 9361 Gross per 24 hour  Intake 279.62 ml  Output 200 ml  Net 79.62 ml    LBM: Last BM Date : 11/04/23 Baseline Weight: Weight: 94 kg Most recent weight: Weight: 89.2 kg     Palliative Assessment/Data:    Time Total: 55 min  Greater than 50%  of this time was spent counseling and coordinating care related to the above assessment and plan.  Signed by: Bernarda Kitty, NP Palliative Medicine Team Pager # [phone] (M-F 8a-5p) Team Phone # [phone] (Nights/Weekends)

## 2023-11-05 NOTE — Progress Notes (Signed)
 Mobility Specialist Progress Note:    11/05/23 1313  Mobility  Activity Refused mobility   Pt politely refused mobility, has been ambulating in the room multiple times. All needs met.   Glinda Lapping Mobility Specialist Please contact via Special educational needs teacher or  Rehab office at [phone]

## 2023-11-06 ENCOUNTER — Other Ambulatory Visit (INDEPENDENT_AMBULATORY_CARE_PROVIDER_SITE_OTHER): Payer: Self-pay | Admitting: Gastroenterology

## 2023-11-06 DIAGNOSIS — C786 Secondary malignant neoplasm of retroperitoneum and peritoneum: Secondary | ICD-10-CM

## 2023-11-06 DIAGNOSIS — K625 Hemorrhage of anus and rectum: Secondary | ICD-10-CM

## 2023-11-06 DIAGNOSIS — E782 Mixed hyperlipidemia: Secondary | ICD-10-CM | POA: Diagnosis not present

## 2023-11-06 DIAGNOSIS — C569 Malignant neoplasm of unspecified ovary: Secondary | ICD-10-CM | POA: Diagnosis not present

## 2023-11-06 DIAGNOSIS — K56609 Unspecified intestinal obstruction, unspecified as to partial versus complete obstruction: Secondary | ICD-10-CM | POA: Diagnosis not present

## 2023-11-06 DIAGNOSIS — I2782 Chronic pulmonary embolism: Secondary | ICD-10-CM | POA: Diagnosis not present

## 2023-11-06 DIAGNOSIS — Z515 Encounter for palliative care: Secondary | ICD-10-CM | POA: Diagnosis not present

## 2023-11-06 DIAGNOSIS — D539 Nutritional anemia, unspecified: Secondary | ICD-10-CM | POA: Diagnosis not present

## 2023-11-06 DIAGNOSIS — Z7189 Other specified counseling: Secondary | ICD-10-CM

## 2023-11-06 DIAGNOSIS — K529 Noninfective gastroenteritis and colitis, unspecified: Secondary | ICD-10-CM | POA: Diagnosis not present

## 2023-11-06 LAB — GLUCOSE, CAPILLARY
Glucose-Capillary: 79 mg/dL (ref 70–99)
Glucose-Capillary: 78 mg/dL (ref 70–99)
Glucose-Capillary: 88 mg/dL (ref 70–99)
Glucose-Capillary: 98 mg/dL (ref 70–99)
Glucose-Capillary: 93 mg/dL (ref 70–99)

## 2023-11-06 LAB — MAGNESIUM: Magnesium: 1.6 mg/dL — ABNORMAL LOW (ref 1.7–2.4)

## 2023-11-06 LAB — COMPREHENSIVE METABOLIC PANEL WITH GFR
ALT: 10 U/L (ref 0–44)
AST: 14 U/L — ABNORMAL LOW (ref 15–41)
Albumin: 2.6 g/dL — ABNORMAL LOW (ref 3.5–5.0)
Alkaline Phosphatase: 42 U/L (ref 38–126)
Anion gap: 7 (ref 5–15)
BUN: 9 mg/dL (ref 6–20)
CO2: 27 mmol/L (ref 22–32)
Calcium: 8.2 mg/dL — ABNORMAL LOW (ref 8.9–10.3)
Chloride: 105 mmol/L (ref 98–111)
Creatinine, Ser: 0.62 mg/dL (ref 0.44–1.00)
GFR, Estimated: >60 mL/min (ref >60)
Glucose, Bld: 75 mg/dL (ref 70–99)
Potassium: 3.4 mmol/L — ABNORMAL LOW (ref 3.5–5.1)
Sodium: 139 mmol/L (ref 135–145)
Total Bilirubin: 1.6 mg/dL — ABNORMAL HIGH (ref 0.0–1.2)
Total Protein: 5.3 g/dL — ABNORMAL LOW (ref 6.5–8.1)

## 2023-11-06 LAB — IRON AND TIBC
Iron: 55 ug/dL (ref 28–170)
Saturation Ratios: 29 % (ref 10.4–31.8)
TIBC: 189 ug/dL — ABNORMAL LOW (ref 250–450)
UIBC: 134 ug/dL

## 2023-11-06 LAB — CBC
HCT: 25.1 % — ABNORMAL LOW (ref 36.0–46.0)
Hemoglobin: 8.4 g/dL — ABNORMAL LOW (ref 12.0–15.0)
MCH: 40.4 pg — ABNORMAL HIGH (ref 26.0–34.0)
MCHC: 33.5 g/dL (ref 30.0–36.0)
MCV: 120.7 fL — ABNORMAL HIGH (ref 80.0–100.0)
Platelets: 92 10*3/uL — ABNORMAL LOW (ref 150–400)
RBC: 2.08 MIL/uL — ABNORMAL LOW (ref 3.87–5.11)
RDW: 17.6 % — ABNORMAL HIGH (ref 11.5–15.5)
WBC: 5.3 10*3/uL (ref 4.0–10.5)
nRBC: 0.8 % — ABNORMAL HIGH (ref 0.0–0.2)

## 2023-11-06 LAB — FERRITIN: Ferritin: 271 ng/mL (ref 11–307)

## 2023-11-06 MED ORDER — INSULIN ASPART 100 UNIT/ML IJ SOLN: 0.0000 [IU] | Freq: Three times a day (TID) | INTRAMUSCULAR | Status: DC

## 2023-11-06 MED ORDER — SENNOSIDES-DOCUSATE SODIUM 8.6-50 MG PO TABS
1.0000 | ORAL_TABLET | Freq: Two times a day (BID) | ORAL | Status: DC
  Filled 2023-11-06: qty 1

## 2023-11-06 MED ORDER — POLYETHYLENE GLYCOL 3350 17 G PO PACK
17.0000 g | PACK | Freq: Every day | ORAL | Status: DC
  Filled 2023-11-06: qty 1

## 2023-11-06 MED ORDER — MAGNESIUM SULFATE 4 GM/100ML IV SOLN
4.0000 g | Freq: Once | INTRAVENOUS | Status: AC
  Administered 2023-11-06: 4 g via INTRAVENOUS
  Filled 2023-11-06: qty 100

## 2023-11-06 NOTE — Progress Notes (Signed)
 [facility] Progress Note     Subjective: Patient seen and examined.  She is resting comfortably in chair at the side of the bed.  She tolerated a little bit of clears this morning without nausea and vomiting.  She had numerous bowel movements since the imaging study yesterday and is also passing flatus.  She denies any bloody bowel movements since admission.  She states that her bloody bowel movements normally occur after she has a formed stool output.  Objective: Vital signs in last 24 hours: Temp:  [98.4 F (36.9 C)-99.5 F (37.5 C)] 98.4 F (36.9 C) (06/04 0330) Pulse Rate:  [87-100] 88 (06/04 0330) Resp:  [16-18] 16 (06/04 0330) BP: (129-151)/(70-79) 151/77 (06/04 0330) SpO2:  [97 %-99 %] 99 % (06/04 0330) Last BM Date : 11/06/23  Intake/Output from previous day: 06/03 0701 - 06/04 0700 In: 0  Out: 450 [Emesis/NG output:450] Intake/Output this shift: No intake/output data recorded.  General appearance: alert, cooperative, and no distress GI: Abdomen soft, nondistended, no percussion tenderness, nontender to palpation; no rigidity, guarding, rebound tenderness; right sided ventral hernia soft and reducible, nontender  Lab Results:  Recent Labs    11/05/23 0359 11/06/23 0340  WBC 4.9 5.3  HGB 9.4* 8.4*  HCT 28.2* 25.1*  PLT 126* 92*   BMET Recent Labs    11/05/23 0359 11/06/23 0340  NA 136 139  K 3.5 3.4*  CL 105 105  CO2 25 27  GLUCOSE 93 75  BUN 11 9  CREATININE 0.71 0.62  CALCIUM  8.3* 8.2*   PT/INR No results for input(s): "LABPROT", "INR" in the last 72 hours.  Studies/Results: DG Abd Portable 1V-Small Bowel Obstruction Protocol-initial, 8 hr delay Result Date: 11/05/2023 CLINICAL DATA:  Small-bowel obstruction.  8 hour delayed image EXAM: PORTABLE ABDOMEN - 1 VIEW COMPARISON:  7:24 a.m. FINDINGS: Nasogastric tube tip overlies the gastric fundus. Administered oral contrast is now seen throughout a nondistended colon. Multiple  gas-filled dilated loops of small bowel persist, however, the combination suggests an underlying distal partial small bowel obstruction or resolving complete small bowel obstruction. Cholelithiasis. Pulmonary hypoinflation. IMPRESSION: 1. Administered oral contrast is now seen throughout a nondistended colon. 2. Persistent dilated loops of small bowel suggesting an underlying distal partial small bowel obstruction or resolving complete small bowel obstruction. Electronically Signed   By: Worthy Heads M.D.   On: 11/05/2023 21:52   DG Abd 1 View Result Date: 11/05/2023 CLINICAL DATA:  Small bowel obstruction. EXAM: ABDOMEN - 1 VIEW COMPARISON:  11/04/2023 and CT abdomen pelvis 11/04/2023 FINDINGS: Nasogastric tube is in the stomach body region. Evidence for calcified gallstones. There continues to be dilated loops of bowel in the abdomen and no significant gas in the rectum. Limited evaluation for free air on this supine examination. IMPRESSION: 1. Persistent dilated loops of bowel in the abdomen. Findings remain compatible with small bowel obstruction. Minimal change from the CT topogram on 11/04/2023. 2. Nasogastric tube is in the stomach body region. 3. Cholelithiasis. Electronically Signed   By: Elene Griffes M.D.   On: 11/05/2023 11:36   DG Abd Portable 1 View Result Date: 11/04/2023 CLINICAL DATA:  NG tube placement EXAM: PORTABLE ABDOMEN - 1 VIEW COMPARISON:  11/03/2021 FINDINGS: NG tube tip in the stomach. Mildly dilated small bowel loops noted. Lung bases clear. IMPRESSION: NG tube tip in the stomach. Electronically Signed   By: Janeece Mechanic M.D.   On: 11/04/2023 12:55    Anti-infectives: Anti-infectives (From admission, onward)  None       Assessment/Plan:  Patient is a 53 year old female who was admitted with a small bowel obstruction.  -Small bowel obstruction protocol demonstrated contrast within the colon after 8 hours.  She had some persistently dilated loops of small bowel which  could be her resolving small bowel obstruction versus a partial obstruction -NG tube removed -Clear liquid diet initiated.  Will continue clears for lunch.  If patient tolerates clear liquids for lunch, will advance to full liquids for dinner -IV fluids per primary team -Patient's hemoglobin has been slowly downtrending.  I think a component of this is hemodilution, especially as her platelets have also been downtrending since admission. -Will defer to hospitalist regarding holding full dose Lovenox .  Would continue to hold Eliquis  while inpatient in the event patient fails to progress and require surgical intervention -GI has been consulted given history of bloody bowel movements.  Would recommend against any endoscopy while inpatient if not necessary given she is just recovering from a small bowel obstruction -PRN pain control and antiemetics -Monitor bowel function -Anticipate discharge home the next 24 to 48 hours if patient continues to progress well with diet -Appreciate hospitalist and GI recommendations   LOS: 2 days    Nicole Santos 11/06/2023  Note: Portions of this report may have been transcribed using voice recognition software. Every effort has been made to ensure accuracy; however, inadvertent computerized transcription errors may still be present.

## 2023-11-06 NOTE — Progress Notes (Signed)
 PROGRESS NOTE   Nicole BEHRMANN  ZOX:[id number] DOB: January 05, 1971 DOA: 11/04/2023 PCP: Eliodoro Guerin, DO   Chief Complaint  Patient presents with   Emesis   Diarrhea   Level of care: Med-Surg  Brief Admission History:  53 y.o. female with medical history significant of High-grade serous carcinoma of peritoneum, peripheral neuropathy, pulmonary embolism, hyperlipidemia who presents to the emergency department due to diarrhea, diffuse abdominal pain, vomiting.  Patient has been admitted for evaluation of small bowel obstruction and general surgery consulted.  She is undergoing conservative measures and is not a candidate for surgical management.    Assessment and Plan:  Small bowel obstruction in the setting of adhesions with peritoneal carcinomatosis NG has been removed and patient feeling better Continue diet as tolerated Continue IV morphine  2 mg q.4h p.r.n. for moderate/severe pain Continue Zofran  p.r.n. for nausea/vomiting Appreciate further general surgery recommendations   Abdominal pain possibly due to ventral hernia CT abdomen pelvis with contrast showed small l right paramidline periumbilical ventral hernia a loop of small bowel,now with interval fluid. General surgery will be consulted   ABLA Macrocytic anemia Remains stable with B12 and folate within normal limits Pt reporting rectal bleeding, says has been going on for a while now and had told her outpatient providers GI consult requested Holding enoxaparin  for now until GI eval Surgery team requesting no endoscopies for now given she is recovering from SBO Pt likely will need to have outpatient GI endoscopy evaluation done    Prediabetes with hyperglycemia Hemoglobin A1c on 07/03/2023 was 5.7 Continue ISS and hypoglycemia protocol   Mixed hyperlipidemia Continue home meds when patient resumes oral intake   Hypomagnesemia - repleted, recheck in AM  Pulmonary embolism Had been on Lovenox  full dose but currently  on hold due to rectal bleeding and anemia Defer to GI team when ok to transition back to home apixaban      High-grade serous carcinoma of peritoneum Patient follows with Dr. Cheree Cords, seen this admission See consult note from Dr. Linnell Richardson   Peripheral neuropathy Continue home meds when patient resumes oral intake   Obesity, class II BMI 37.9   DVT prophylaxis: enoxaparin (on hold)/SCDs Code Status: Full  Family Communication: husband at bedside  Disposition: home    Consultants:  Surgery Oncology GI Procedures:   Antimicrobials:    Subjective: Pt said she has had 4 BMs overnight with red blood seen in stool.   Objective: Vitals:   11/05/23 0349 11/05/23 1244 11/05/23 2011 11/06/23 0330  BP: (!) 149/79 129/70 (!) 140/79 (!) 151/77  Pulse: 99 87 100 88  Resp: 18  18 16   Temp: 99.4 F (37.4 C) 98.5 F (36.9 C) 99.5 F (37.5 C) 98.4 F (36.9 C)  TempSrc: Oral Oral Oral Oral  SpO2: 94% 97% 97% 99%  Weight:      Height:        Intake/Output Summary (Last 24 hours) at 11/06/2023 0956 Last data filed at 11/06/2023 0531 Gross per 24 hour  Intake 0 ml  Output 450 ml  Net -450 ml   Filed Weights   11/04/23 0226 11/04/23 1509  Weight: 94 kg 89.2 kg   Examination:  General exam: Appears calm and comfortable  Respiratory system: Clear to auscultation. Respiratory effort normal. Cardiovascular system: normal S1 & S2 heard. No JVD, murmurs, rubs, gallops or clicks. No pedal edema. Gastrointestinal system: Abdomen is nondistended, soft and nontender. No organomegaly or masses felt. Normal bowel sounds heard. Central nervous system: Alert and oriented. No  focal neurological deficits. Extremities: Symmetric 5 x 5 power. Skin: No rashes, lesions or ulcers. Psychiatry: Judgement and insight appear normal. Mood & affect appropriate.   Data Reviewed: I have personally reviewed following labs and imaging studies  CBC: Recent Labs  Lab 11/04/23 0233 11/05/23 0359 11/06/23 0340   WBC 10.6* 4.9 5.3  HGB 11.6* 9.4* 8.4*  HCT 33.1* 28.2* 25.1*  MCV 114.9* 121.0* 120.7*  PLT 133* 126* 92*    Basic Metabolic Panel: Recent Labs  Lab 11/04/23 0233 11/05/23 0359 11/06/23 0340  NA 138 136 139  K 3.5 3.5 3.4*  CL 103 105 105  CO2 20* 25 27  GLUCOSE 194* 93 75  BUN 15 11 9   CREATININE 0.98 0.71 0.62  CALCIUM  9.6 8.3* 8.2*  MG 1.5* 2.0 1.6*  PHOS  --  3.7  --     CBG: Recent Labs  Lab 11/05/23 1608 11/05/23 2013 11/05/23 2343 11/06/23 0329 11/06/23 0805  GLUCAP 75 74 73 79 78    No results found for this or any previous visit (from the past 240 hours).   Radiology Studies: DG Abd Portable 1V-Small Bowel Obstruction Protocol-initial, 8 hr delay Result Date: 11/05/2023 CLINICAL DATA:  Small-bowel obstruction.  8 hour delayed image EXAM: PORTABLE ABDOMEN - 1 VIEW COMPARISON:  7:24 a.m. FINDINGS: Nasogastric tube tip overlies the gastric fundus. Administered oral contrast is now seen throughout a nondistended colon. Multiple gas-filled dilated loops of small bowel persist, however, the combination suggests an underlying distal partial small bowel obstruction or resolving complete small bowel obstruction. Cholelithiasis. Pulmonary hypoinflation. IMPRESSION: 1. Administered oral contrast is now seen throughout a nondistended colon. 2. Persistent dilated loops of small bowel suggesting an underlying distal partial small bowel obstruction or resolving complete small bowel obstruction. Electronically Signed   By: Worthy Heads M.D.   On: 11/05/2023 21:52   DG Abd 1 View Result Date: 11/05/2023 CLINICAL DATA:  Small bowel obstruction. EXAM: ABDOMEN - 1 VIEW COMPARISON:  11/04/2023 and CT abdomen pelvis 11/04/2023 FINDINGS: Nasogastric tube is in the stomach body region. Evidence for calcified gallstones. There continues to be dilated loops of bowel in the abdomen and no significant gas in the rectum. Limited evaluation for free air on this supine examination.  IMPRESSION: 1. Persistent dilated loops of bowel in the abdomen. Findings remain compatible with small bowel obstruction. Minimal change from the CT topogram on 11/04/2023. 2. Nasogastric tube is in the stomach body region. 3. Cholelithiasis. Electronically Signed   By: Elene Griffes M.D.   On: 11/05/2023 11:36   DG Abd Portable 1 View Result Date: 11/04/2023 CLINICAL DATA:  NG tube placement EXAM: PORTABLE ABDOMEN - 1 VIEW COMPARISON:  11/03/2021 FINDINGS: NG tube tip in the stomach. Mildly dilated small bowel loops noted. Lung bases clear. IMPRESSION: NG tube tip in the stomach. Electronically Signed   By: Janeece Mechanic M.D.   On: 11/04/2023 12:55   Scheduled Meds:  bisacodyl   10 mg Rectal Daily   Chlorhexidine  Gluconate Cloth  6 each Topical Q0600   insulin  aspart  0-9 Units Subcutaneous TID WC   Continuous Infusions:  magnesium  sulfate bolus IVPB      LOS: 2 days   Time spent: 55 mins  Mordechai Matuszak Lincoln Renshaw, MD How to contact the [facility] Attending or Consulting provider 7A - 7P or covering provider during after hours 7P -7A, for this patient?  Check the care team in [facility] and look for a) attending/consulting [facility] provider listed and b) the [facility] team  listed Log into [email or web address] to find provider on call.  Locate the [facility] provider you are looking for under [facility] and page to a number that you can be directly reached. If you still have difficulty reaching the provider, please page the [facility] (Director on Call) for the Hospitalists listed on amion for assistance.  11/06/2023, 9:56 AM

## 2023-11-06 NOTE — Consult Note (Addendum)
 Gastroenterology Consult   Referring Provider: No ref. provider found Primary Care Physician:  Eliodoro Guerin, DO Primary Gastroenterologist:  Dr. General Kenner   Patient ID: Nicole Santos; [id number]; [birth year]   Admit date: 11/04/2023  LOS: 2 days   Date of Consultation: 11/06/2023  Reason for Consultation:  rectal bleeding/anemia   History of Present Illness   Nicole Santos is a 53 y.o. year old female with history of ovarican cancer with peritoneal carcinomatosis, with 3 weeks since last Avasatin infusion who reported usual episodes of diarrhea after her last infusion that had improved, CT on 5/22 without signs of disease progression but loop of small bowel in ventral hernia and gall stones, both chronic findings. She reported return of diarrhea, abdominal pain and vomiting on 6/2 prompting her to present to the ED. Notably patient reported ongoing rectal bleeding, GI was consulted today for further evaluation  Hgb on admission 11.6 (consistent with baseline) Down to 8.4 this morning   Consult: Patient reports her abdominal pain has resolved. She reports diarrhea that starts after her chemo infusions and usually last 3-4 days. The last infusion she had she actually developed nausea and vomiting which have resolved. She has had 2 formed but soft BMs as well as 2 watery stools today.   She reports rectal bleeding began around 1 month ago. She mentioned it to her oncologist but they felt it may be due to hemorrhoids. She sees BRB mixed in with her stools when she has more formed stools. She notes that she does not notice blood when she has diarrhea. Denies any black colored stools. She denies any pain, itching, burning in her rectal area. She reports she has looked and has not seen or felt any hemorrhoids present externally.  She was previously on eliquis  but this was recently stopped. No dizziness, sob or syncope.   States she has not ever had a colonoscopy as she was set up for one in the  past but had significant vomiting after prepping and was unable to complete the procedure   No previous EGD Does not take any NSAIDs.  Past Medical History:  Diagnosis Date   Acute pulmonary embolism (HCC) 03/06/2022   Acute respiratory disease due to COVID-19 virus 02/02/2019   Anxiety    Cancer (HCC)    Depression    Dyspnea    Dysrhythmia    Family history of bladder cancer    Family history of breast cancer    History of diabetes mellitus    Resolved in Sept 2023 with weight loss.   Hypertension    Ovarian cancer (HCC) 11/14/2021   Peritoneal carcinomatosis (HCC)    Pneumonia    Sleep apnea    doesn't use CPAP   Tachycardia     Past Surgical History:  Procedure Laterality Date   KNEE SURGERY Right    Arthroscopic   LAPAROSCOPIC ABDOMINAL EXPLORATION N/A 11/13/2021   Procedure: Exploratory laparoscopy;  Surgeon: Alanda Allegra, MD;  Location: [facility];  Service: General;  Laterality: N/A;   PORTACATH PLACEMENT Left 11/15/2021   Procedure: INSERTION PORT-A-CATH;  Surgeon: Alanda Allegra, MD;  Location: [facility];  Service: General;  Laterality: Left;   TONSILLECTOMY AND ADENOIDECTOMY      Prior to Admission medications   Medication Sig Start Date End Date Taking? Authorizing Provider  apixaban  (ELIQUIS ) 5 MG TABS tablet Take 1 tablet (5 mg total) by mouth 2 (two) times daily. 07/03/23  Yes Gottschalk, Sharolyn Decant M, DO  atorvastatin  (LIPITOR) 20  MG tablet Take 1 tablet (20 mg total) by mouth at bedtime. 07/03/23  Yes Gottschalk, Ashly M, DO  DULoxetine  (CYMBALTA ) 30 MG capsule TAKE 1 CAPSULE BY MOUTH DAILY FOR 7 DAYS, THEN INCREASE TO 2 CAPSULES DAILY Patient taking differently: Take 60 mg by mouth daily. 06/03/23  Yes Paulett Boros, MD  lidocaine -prilocaine  (EMLA ) cream Apply a quarter sized amount to port a cath site and cover with plastic wrap 1 hour prior to infusion appointments Patient taking differently: Apply 1 Application topically See admin instructions. Apply a quarter  sized amount to port a cath site and cover with plastic wrap 1 hour prior to infusion appointments 12/17/22  Yes Paulett Boros, MD  LYNPARZA  150 MG tablet TAKE 2 TABLETS TWICE A DAY. MAY TAKE WITH FOOD TO DECEASE NAUSEA AND VOMITING. (SWALLOW WHOLE) Patient taking differently: Take 300 mg by mouth 2 (two) times daily. 07/19/23  Yes Paulett Boros, MD  magnesium  oxide (MAG-OX) 400 (240 Mg) MG tablet TAKE 1 TABLET BY MOUTH TWICE A DAY Patient taking differently: Take 400 mg by mouth 2 (two) times daily. 05/27/23  Yes Paulett Boros, MD  Multiple Vitamin (MULTIVITAMIN) capsule Take 1 capsule by mouth daily.   Yes [provider]  Potassium 99 MG TABS Take 99 mg by mouth at bedtime.   Yes [provider]  diphenoxylate -atropine  (LOMOTIL ) 2.5-0.025 MG tablet Take 2 tablets by mouth after the first watery bowel movement. May then take 1 pill by mouth after each watery bowel movement. Do not exceed 8 pills in a 24 hour period. Patient taking differently: Take 2 tablets by mouth See admin instructions. Take 2 tablets by mouth after the first watery bowel movement. May then take 1 pill by mouth after each watery bowel movement. Do not exceed 8 pills in a 24 hour period. 07/15/23   Paulett Boros, MD    Current Facility-Administered Medications  Medication Dose Route Frequency Provider Last Rate Last Admin   acetaminophen  (TYLENOL ) tablet 650 mg  650 mg Oral Q6H PRN Adefeso, Oladapo, DO       Or   acetaminophen  (TYLENOL ) suppository 650 mg  650 mg Rectal Q6H PRN Adefeso, Oladapo, DO       bisacodyl  (DULCOLAX) suppository 10 mg  10 mg Rectal Daily Pappayliou, Catherine A, DO       Chlorhexidine  Gluconate Cloth 2 % PADS 6 each  6 each Topical Q0600 Doreene Gammon D, DO   6 each at 11/06/23 5621   insulin  aspart (novoLOG ) injection 0-9 Units  0-9 Units Subcutaneous TID WC Johnson, Clanford L, MD       magnesium  sulfate IVPB 4 g 100 mL  4 g Intravenous Once Johnson,  Clanford L, MD       morphine  (PF) 2 MG/ML injection 2 mg  2 mg Intravenous Q4H PRN Adefeso, Oladapo, DO   2 mg at 11/04/23 2351   ondansetron  (ZOFRAN ) tablet 4 mg  4 mg Oral Q6H PRN Adefeso, Oladapo, DO       Or   ondansetron  (ZOFRAN ) injection 4 mg  4 mg Intravenous Q6H PRN Adefeso, Oladapo, DO       Facility-Administered Medications Ordered in Other Encounters  Medication Dose Route Frequency Provider Last Rate Last Admin   heparin  lock flush 100 unit/mL  500 Units Intravenous Once Katragadda, Sreedhar, MD       sodium chloride  flush (NS) 0.9 % injection 10 mL  10 mL Intravenous PRN Paulett Boros, MD       sodium chloride  flush (  NS) 0.9 % injection 10 mL  10 mL Intracatheter PRN Paulett Boros, MD   10 mL at 10/14/23 1610    Allergies as of 11/04/2023 - Review Complete 11/04/2023  Allergen Reaction Noted   Augmentin  [amoxicillin -pot clavulanate] Diarrhea and Nausea And Vomiting 09/12/2017   Doxycycline  08/12/2013   Topamax  [topiramate ] Nausea And Vomiting 02/19/2020    Family History  Problem Relation Age of Onset   Hypertension Mother    Heart attack Paternal Uncle    Bladder Cancer Maternal Grandmother 45       non-smoker   Dementia Maternal Grandfather    Breast cancer Maternal Great-grandmother        MGF's mother   Colon cancer Neg Hx    Ovarian cancer Neg Hx    Endometrial cancer Neg Hx    Pancreatic cancer Neg Hx    Prostate cancer Neg Hx     Social History   Socioeconomic History   Marital status: Married    Spouse name: Not on file   Number of children: Not on file   Years of education: Not on file   Highest education level: Not on file  Occupational History   Occupation: Unemployed  Tobacco Use   Smoking status: Never   Smokeless tobacco: Never  Vaping Use   Vaping status: Never Used  Substance and Sexual Activity   Alcohol use: No   Drug use: No   Sexual activity: Not Currently  Other Topics Concern   Not on file  Social History  Narrative   Not on file   Social Drivers of Health   Financial Resource Strain: Not on file  Food Insecurity: No Food Insecurity (11/04/2023)   Hunger Vital Sign    Worried About Running Out of Food in the Last Year: Never true    Ran Out of Food in the Last Year: Never true  Transportation Needs: No Transportation Needs (11/04/2023)   PRAPARE - Administrator, Civil Service (Medical): No    Lack of Transportation (Non-Medical): No  Physical Activity: Not on file  Stress: Not on file  Social Connections: Not on file  Intimate Partner Violence: Not At Risk (11/04/2023)   Humiliation, Afraid, Rape, and Kick questionnaire    Fear of Current or Ex-Partner: No    Emotionally Abused: No    Physically Abused: No    Sexually Abused: No     Review of Systems   Gen: Denies any fever, chills, loss of appetite, change in weight or weight loss CV: Denies chest pain, heart palpitations, syncope, edema  Resp: Denies shortness of breath with rest, cough, wheezing, coughing up blood, and pleurisy. GI: denies melena, nausea, vomiting, constipation, dysphagia, odyonophagia, early satiety or weight loss. +rectal bleeding  GU : Denies urinary burning, blood in urine, urinary frequency, and urinary incontinence. MS: Denies joint pain, limitation of movement, swelling, cramps, and atrophy.  Derm: Denies rash, itching, dry skin, hives. Psych: Denies depression, anxiety, memory loss, hallucinations, and confusion. Heme: Denies bruising or bleeding Neuro:  Denies any headaches, dizziness, paresthesias, shaking  Physical Exam   Vital Signs in last 24 hours: Temp:  [98.4 F (36.9 C)-99.5 F (37.5 C)] 98.4 F (36.9 C) (06/04 0330) Pulse Rate:  [87-100] 88 (06/04 0330) Resp:  [16-18] 16 (06/04 0330) BP: (129-151)/(70-79) 151/77 (06/04 0330) SpO2:  [97 %-99 %] 99 % (06/04 0330) Last BM Date : 11/06/23  General:   Alert,  Well-developed, well-nourished, pleasant and cooperative in NAD Head:  Normocephalic and atraumatic. Eyes:  Sclera clear, no icterus.   Conjunctiva pink. Ears:  Normal auditory acuity. Mouth:  No deformity or lesions, dentition normal. Neck:  Supple; no masses Lungs:  Clear throughout to auscultation.   No wheezes, crackles, or rhonchi. No acute distress. Heart:  Regular rate and rhythm; no murmurs, clicks, rubs,  or gallops. Abdomen:  Soft, nontender and nondistended. No masses, hepatosplenomegaly or hernias noted. Normal bowel sounds, without guarding, and without rebound.   Rectal: patient declined rectal exam    Msk:  Symmetrical without gross deformities. Normal posture. Extremities:  Without clubbing or edema. Neurologic:  Alert and  oriented x4. Skin:  Intact without significant lesions or rashes. Psych:  Alert and cooperative. Normal mood and affect.   Labs/Studies   Recent Labs Recent Labs    11/04/23 0233 11/05/23 0359 11/06/23 0340  WBC 10.6* 4.9 5.3  HGB 11.6* 9.4* 8.4*  HCT 33.1* 28.2* 25.1*  PLT 133* 126* 92*   BMET Recent Labs    11/04/23 0233 11/05/23 0359 11/06/23 0340  NA 138 136 139  K 3.5 3.5 3.4*  CL 103 105 105  CO2 20* 25 27  GLUCOSE 194* 93 75  BUN 15 11 9   CREATININE 0.98 0.71 0.62  CALCIUM  9.6 8.3* 8.2*   LFT Recent Labs    11/04/23 0233 11/05/23 0359 11/06/23 0340  PROT 7.0 5.9* 5.3*  ALBUMIN 3.7 3.0* 2.6*  AST 25 18 14*  ALT 13 11 10   ALKPHOS 58 47 42  BILITOT 1.9* 1.4* 1.6*    Radiology/Studies DG Abd Portable 1V-Small Bowel Obstruction Protocol-initial, 8 hr delay Result Date: 11/05/2023 CLINICAL DATA:  Small-bowel obstruction.  8 hour delayed image EXAM: PORTABLE ABDOMEN - 1 VIEW COMPARISON:  7:24 a.m. FINDINGS: Nasogastric tube tip overlies the gastric fundus. Administered oral contrast is now seen throughout a nondistended colon. Multiple gas-filled dilated loops of small bowel persist, however, the combination suggests an underlying distal partial small bowel obstruction or resolving complete  small bowel obstruction. Cholelithiasis. Pulmonary hypoinflation. IMPRESSION: 1. Administered oral contrast is now seen throughout a nondistended colon. 2. Persistent dilated loops of small bowel suggesting an underlying distal partial small bowel obstruction or resolving complete small bowel obstruction. Electronically Signed   By: Worthy Heads M.D.   On: 11/05/2023 21:52   DG Abd 1 View Result Date: 11/05/2023 CLINICAL DATA:  Small bowel obstruction. EXAM: ABDOMEN - 1 VIEW COMPARISON:  11/04/2023 and CT abdomen pelvis 11/04/2023 FINDINGS: Nasogastric tube is in the stomach body region. Evidence for calcified gallstones. There continues to be dilated loops of bowel in the abdomen and no significant gas in the rectum. Limited evaluation for free air on this supine examination. IMPRESSION: 1. Persistent dilated loops of bowel in the abdomen. Findings remain compatible with small bowel obstruction. Minimal change from the CT topogram on 11/04/2023. 2. Nasogastric tube is in the stomach body region. 3. Cholelithiasis. Electronically Signed   By: Elene Griffes M.D.   On: 11/05/2023 11:36   DG Abd Portable 1 View Result Date: 11/04/2023 CLINICAL DATA:  NG tube placement EXAM: PORTABLE ABDOMEN - 1 VIEW COMPARISON:  11/03/2021 FINDINGS: NG tube tip in the stomach. Mildly dilated small bowel loops noted. Lung bases clear. IMPRESSION: NG tube tip in the stomach. Electronically Signed   By: Janeece Mechanic M.D.   On: 11/04/2023 12:55     Assessment   Nicole Santos is a 53 y.o. year old female with history of significant of High-grade serous carcinoma  of peritoneum, peripheral neuropathy, pulmonary embolism, hyperlipidemia who presented to the hospital on 6/2 for diarrhea, diffuse abdominal pain, vomiting.  Patient admitted for evaluation of small bowel obstruction and general surgery consulted. She later reported rectal bleeding that had been ongoing and hemoglobin trending down this admission, GI consulted for further  evaluation.  Diarrhea: chronic, intermittent that occurs with her chemotherapy.  Likely worsened recently due to SBO which appears to be resolving Having intermittent solid stools now  Rectal bleeding/macrocytic Anemia:  -X1 month, only when having solid stools -Patient denies seeing or feeling any hemorrhoids, she declined rectal exam during our encounter -No rectal pain, itching or burning associated with bleeding. -hgb 11.4 on admission down to 8.4, suspect some aspect of hemodilution, she is asymptomatic  -B12 and folate WNL -Lovenox  on hold -Surgery recommended no endo procedures for now given recent SBO -no iron studies done  -discussed importance of outpatient colonoscopy with the patient given no previous colonoscopy and ongoing rectal bleeding   SBO in setting of adhesions with peritoneal carcinomatosis:  -previously with NG which has been removed -Surgery on board, not a surgical candidate at this time -Abd xray yesterday with persistent dilated loops of small bowel, suggesting underlying distal partial SBO/resolving complete SBO -denies nausea or vomiting -tolerating liquid diet -defer to general surgery for further recommendations    Plan / Recommendations   Trend h&H Monitor for overt GI bleeding Consider outpatient Colonoscopy with Primary GI once surgery clears for endo procedures Iron studies 5. Continue supportive measures   GI will follow peripherally for now    11/06/2023, 10:07 AM  Kadija Cruzen L. Aleen Marston, MSN, APRN, AGNP-C Adult-Gerontology Nurse Practitioner [facility]

## 2023-11-06 NOTE — Plan of Care (Signed)
  Problem: Coping: Goal: Ability to adjust to condition or change in health will improve Outcome: Progressing   Problem: Fluid Volume: Goal: Ability to maintain a balanced intake and output will improve Outcome: Progressing   Problem: Health Behavior/Discharge Planning: Goal: Ability to manage health-related needs will improve Outcome: Progressing   Problem: Metabolic: Goal: Ability to maintain appropriate glucose levels will improve Outcome: Progressing   Problem: Tissue Perfusion: Goal: Adequacy of tissue perfusion will improve Outcome: Progressing   Problem: Health Behavior/Discharge Planning: Goal: Ability to manage health-related needs will improve Outcome: Progressing

## 2023-11-06 NOTE — Progress Notes (Signed)
 11/06/2023 8:58 AM   During morning rounds, patient reports that she has been having rectal bleeding.  It has been going on for a while and she reported it to her outpatient doctors.  Her Hg is drifting down since admission.  Discussed with surgery team, ok to consult GI but would prefer to hold off any endoscopies at this time while recovering from SBO.  Hold further enoxaparin  until GI eval.    Hemoglobin & Hematocrit     Component Value Date/Time   HGB 8.4 (L) 11/06/2023 0340   HGB 9.0 (L) 02/08/2022 1459   HCT 25.1 (L) 11/06/2023 0340   HCT 27.0 (L) 02/08/2022 1459   C. Lincoln Renshaw, MD

## 2023-11-06 NOTE — Plan of Care (Signed)

## 2023-11-06 NOTE — Hospital Course (Signed)
 53 y.o. female with medical history significant of High-grade serous carcinoma of peritoneum, peripheral neuropathy, pulmonary embolism, hyperlipidemia who presents to the emergency department due to diarrhea, diffuse abdominal pain, vomiting.  Patient has been admitted for evaluation of small bowel obstruction and general surgery consulted.  She is undergoing conservative measures and is not a candidate for surgical management.

## 2023-11-06 NOTE — Consult Note (Signed)
 [facility] Oncology Progress Note  Name: Nicole Santos      MRN: [id number]    Location: A301/A301-01  Date: 11/06/2023 Time:2:43 PM   Subjective: Interval History:Nicole Santos is sitting by the side of the bed with family members in the room.  She had NG tube removed this morning.  She reports that she is feeling well.  She had 2 soft bowel movements and 2 watery bowel movements.  She has eaten Jell-O and kept it down.  Objective: Vital signs in last 24 hours: Temp:  [98.4 F (36.9 C)-99.5 F (37.5 C)] 99 F (37.2 C) (06/04 1343) Pulse Rate:  [88-107] 107 (06/04 1343) Resp:  [16-18] 18 (06/04 1343) BP: (102-151)/(69-79) 102/69 (06/04 1343) SpO2:  [97 %-99 %] 97 % (06/04 1343)    Intake/Output from previous day: 06/03 0701 - 06/04 0700 In: 0  Out: 450     Intake/Output this shift: No intake/output data recorded.   PHYSICAL EXAM: BP 102/69 (BP Location: Right Arm)   Pulse (!) 107   Temp 99 F (37.2 C) (Oral)   Resp 18   Ht 5\' 2"  (1.575 m)   Wt 196 lb 10.4 oz (89.2 kg)   SpO2 97%   BMI 35.97 kg/m  General appearance: alert, cooperative, and appears stated age   Studies/Results: Results for orders placed or performed during the hospital encounter of 11/04/23 (from the past 48 hours)  Glucose, capillary     Status: Abnormal   Collection Time: 11/04/23  4:13 PM  Result Value Ref Range   Glucose-Capillary 106 (H) 70 - 99 mg/dL    Comment: Glucose reference range applies only to samples taken after fasting for at least 8 hours.   Comment 1 Notify RN    Comment 2 Document in Chart   Glucose, capillary     Status: None   Collection Time: 11/04/23  7:52 PM  Result Value Ref Range   Glucose-Capillary 96 70 - 99 mg/dL    Comment: Glucose reference range applies only to samples taken after fasting for at least 8 hours.   Comment 1 Notify RN    Comment 2 Document in Chart   Glucose, capillary     Status: None   Collection Time: 11/04/23 11:42 PM  Result Value Ref  Range   Glucose-Capillary 90 70 - 99 mg/dL    Comment: Glucose reference range applies only to samples taken after fasting for at least 8 hours.  Glucose, capillary     Status: Abnormal   Collection Time: 11/05/23  3:51 AM  Result Value Ref Range   Glucose-Capillary 105 (H) 70 - 99 mg/dL    Comment: Glucose reference range applies only to samples taken after fasting for at least 8 hours.  HIV Antibody (routine testing w rflx)     Status: None   Collection Time: 11/05/23  3:59 AM  Result Value Ref Range   HIV Screen 4th Generation wRfx Non Reactive Non Reactive    Comment: Performed at [facility], [address] [place], [name] [address]  Comprehensive metabolic panel     Status: Abnormal   Collection Time: 11/05/23  3:59 AM  Result Value Ref Range   Sodium 136 135 - 145 mmol/L   Potassium 3.5 3.5 - 5.1 mmol/L   Chloride 105 98 - 111 mmol/L   CO2 25 22 - 32 mmol/L   Glucose, Bld 93 70 - 99 mg/dL    Comment: Glucose reference range applies only  to samples taken after fasting for at least 8 hours.   BUN 11 6 - 20 mg/dL   Creatinine, Ser 1.66 0.44 - 1.00 mg/dL   Calcium  8.3 (L) 8.9 - 10.3 mg/dL   Total Protein 5.9 (L) 6.5 - 8.1 g/dL   Albumin 3.0 (L) 3.5 - 5.0 g/dL   AST 18 15 - 41 U/L   ALT 11 0 - 44 U/L   Alkaline Phosphatase 47 38 - 126 U/L   Total Bilirubin 1.4 (H) 0.0 - 1.2 mg/dL   GFR, Estimated >06 >30 mL/min    Comment: (NOTE) Calculated using the CKD-EPI Creatinine Equation (2021)    Anion gap 6 5 - 15    Comment: Performed at [facility], [address] [place], [name] [address]  CBC     Status: Abnormal   Collection Time: 11/05/23  3:59 AM  Result Value Ref Range   WBC 4.9 4.0 - 10.5 K/uL   RBC 2.33 (L) 3.87 - 5.11 MIL/uL   Hemoglobin 9.4 (L) 12.0 - 15.0 g/dL   HCT 93.2 (L) 35.5 - 73.2 %   MCV 121.0 (H) 80.0 - 100.0 fL   MCH 40.3 (H) 26.0 - 34.0 pg   MCHC 33.3 30.0 - 36.0 g/dL   RDW 20.2 (H) 54.2 - 70.6 %   Platelets 126 (L) 150 - 400 K/uL   nRBC  1.6 (H) 0.0 - 0.2 %    Comment: Performed at [facility], [address] [place], [name] [address]  Magnesium      Status: None   Collection Time: 11/05/23  3:59 AM  Result Value Ref Range   Magnesium  2.0 1.7 - 2.4 mg/dL    Comment: Performed at [facility], [address] [place], [name] [address]  Phosphorus     Status: None   Collection Time: 11/05/23  3:59 AM  Result Value Ref Range   Phosphorus 3.7 2.5 - 4.6 mg/dL    Comment: Performed at [facility], [address], [place], [name] [address]  Glucose, capillary     Status: None   Collection Time: 11/05/23  7:07 AM  Result Value Ref Range   Glucose-Capillary 98 70 - 99 mg/dL    Comment: Glucose reference range applies only to samples taken after fasting for at least 8 hours.   Comment 1 Notify RN    Comment 2 Document in Chart   Glucose, capillary     Status: None   Collection Time: 11/05/23 11:36 AM  Result Value Ref Range   Glucose-Capillary 92 70 - 99 mg/dL    Comment: Glucose reference range applies only to samples taken after fasting for at least 8 hours.   Comment 1 Notify RN    Comment 2 Document in Chart   Glucose, capillary     Status: None   Collection Time: 11/05/23  4:08 PM  Result Value Ref Range   Glucose-Capillary 75 70 - 99 mg/dL    Comment: Glucose reference range applies only to samples taken after fasting for at least 8 hours.   Comment 1 Notify RN    Comment 2 Document in Chart   Glucose, capillary     Status: None   Collection Time: 11/05/23  8:13 PM  Result Value Ref Range   Glucose-Capillary 74 70 - 99 mg/dL    Comment: Glucose reference range applies only to samples taken after fasting for at least 8 hours.  Glucose, capillary     Status: None   Collection Time: 11/05/23  11:43 PM  Result Value Ref Range   Glucose-Capillary 73 70 - 99 mg/dL    Comment: Glucose reference range applies only to samples taken after fasting for at least 8 hours.  Glucose, capillary     Status: None   Collection  Time: 11/06/23  3:29 AM  Result Value Ref Range   Glucose-Capillary 79 70 - 99 mg/dL    Comment: Glucose reference range applies only to samples taken after fasting for at least 8 hours.  CBC     Status: Abnormal   Collection Time: 11/06/23  3:40 AM  Result Value Ref Range   WBC 5.3 4.0 - 10.5 K/uL   RBC 2.08 (L) 3.87 - 5.11 MIL/uL   Hemoglobin 8.4 (L) 12.0 - 15.0 g/dL   HCT 56.2 (L) 13.0 - 86.5 %   MCV 120.7 (H) 80.0 - 100.0 fL   MCH 40.4 (H) 26.0 - 34.0 pg   MCHC 33.5 30.0 - 36.0 g/dL   RDW 78.4 (H) 69.6 - 29.5 %   Platelets 92 (L) 150 - 400 K/uL    Comment: SPECIMEN CHECKED FOR CLOTS Immature Platelet Fraction may be clinically indicated, consider ordering this additional test [id number] REPEATED TO VERIFY PLATELET COUNT CONFIRMED BY SMEAR    nRBC 0.8 (H) 0.0 - 0.2 %    Comment: Performed at [facility], [address], [place], [name] [address]  Magnesium      Status: Abnormal   Collection Time: 11/06/23  3:40 AM  Result Value Ref Range   Magnesium  1.6 (L) 1.7 - 2.4 mg/dL    Comment: Performed at [facility], [address] [place], [name] [address]  Comprehensive metabolic panel with GFR     Status: Abnormal   Collection Time: 11/06/23  3:40 AM  Result Value Ref Range   Sodium 139 135 - 145 mmol/L   Potassium 3.4 (L) 3.5 - 5.1 mmol/L   Chloride 105 98 - 111 mmol/L   CO2 27 22 - 32 mmol/L   Glucose, Bld 75 70 - 99 mg/dL    Comment: Glucose reference range applies only to samples taken after fasting for at least 8 hours.   BUN 9 6 - 20 mg/dL   Creatinine, Ser 4.40 0.44 - 1.00 mg/dL   Calcium  8.2 (L) 8.9 - 10.3 mg/dL   Total Protein 5.3 (L) 6.5 - 8.1 g/dL   Albumin 2.6 (L) 3.5 - 5.0 g/dL   AST 14 (L) 15 - 41 U/L   ALT 10 0 - 44 U/L   Alkaline Phosphatase 42 38 - 126 U/L   Total Bilirubin 1.6 (H) 0.0 - 1.2 mg/dL   GFR, Estimated >34 >74 mL/min    Comment: (NOTE) Calculated using the CKD-EPI Creatinine Equation (2021)    Anion gap 7 5 - 15    Comment: Performed  at [facility], [address] [place], [name] [address]  Glucose, capillary     Status: None   Collection Time: 11/06/23  8:05 AM  Result Value Ref Range   Glucose-Capillary 78 70 - 99 mg/dL    Comment: Glucose reference range applies only to samples taken after fasting for at least 8 hours.   Comment 1 Notify RN    Comment 2 Document in Chart   Glucose, capillary     Status: None   Collection Time: 11/06/23 11:01 AM  Result Value Ref Range   Glucose-Capillary 88 70 - 99 mg/dL    Comment: Glucose reference range applies only to samples taken after  fasting for at least 8 hours.   Comment 1 Notify RN    Comment 2 Document in Chart    DG Abd Portable 1V-Small Bowel Obstruction Protocol-initial, 8 hr delay Result Date: 11/05/2023 CLINICAL DATA:  Small-bowel obstruction.  8 hour delayed image EXAM: PORTABLE ABDOMEN - 1 VIEW COMPARISON:  7:24 a.m. FINDINGS: Nasogastric tube tip overlies the gastric fundus. Administered oral contrast is now seen throughout a nondistended colon. Multiple gas-filled dilated loops of small bowel persist, however, the combination suggests an underlying distal partial small bowel obstruction or resolving complete small bowel obstruction. Cholelithiasis. Pulmonary hypoinflation. IMPRESSION: 1. Administered oral contrast is now seen throughout a nondistended colon. 2. Persistent dilated loops of small bowel suggesting an underlying distal partial small bowel obstruction or resolving complete small bowel obstruction. Electronically Signed   By: Worthy Heads M.D.   On: 11/05/2023 21:52   DG Abd 1 View Result Date: 11/05/2023 CLINICAL DATA:  Small bowel obstruction. EXAM: ABDOMEN - 1 VIEW COMPARISON:  11/04/2023 and CT abdomen pelvis 11/04/2023 FINDINGS: Nasogastric tube is in the stomach body region. Evidence for calcified gallstones. There continues to be dilated loops of bowel in the abdomen and no significant gas in the rectum. Limited evaluation for free air on this  supine examination. IMPRESSION: 1. Persistent dilated loops of bowel in the abdomen. Findings remain compatible with small bowel obstruction. Minimal change from the CT topogram on 11/04/2023. 2. Nasogastric tube is in the stomach body region. 3. Cholelithiasis. Electronically Signed   By: Elene Griffes M.D.   On: 11/05/2023 11:36     MEDICATIONS: I have reviewed the patient's current medications.     Assessment/Plan:  1.  High-grade serous carcinoma of the peritoneum: - On maintenance Lynparza  and bevacizumab .  Last bevacizumab  on 10/14/2023. - CTAP (10/24/2023): No convincing evidence of active malignancy. - Lynparza  on hold since hospitalization.  2.  Partial small bowel obstruction: - NG tube has been removed this morning.  She has eaten Jell-O this morning and kept it down.  She had 2 soft bowel movements and 2 watery bowel movements and is passing gas. - She is started on clear liquids. - Hemoglobin dropped down to 8.4, likely from hemodilution.  All questions were answered. The patient knows to call the clinic with any problems, questions or concerns. We can certainly see the patient much sooner if necessary.    Ming Mcmannis

## 2023-11-07 DIAGNOSIS — D539 Nutritional anemia, unspecified: Secondary | ICD-10-CM | POA: Diagnosis not present

## 2023-11-07 DIAGNOSIS — I2782 Chronic pulmonary embolism: Secondary | ICD-10-CM | POA: Diagnosis not present

## 2023-11-07 DIAGNOSIS — K56609 Unspecified intestinal obstruction, unspecified as to partial versus complete obstruction: Secondary | ICD-10-CM | POA: Diagnosis not present

## 2023-11-07 DIAGNOSIS — C569 Malignant neoplasm of unspecified ovary: Secondary | ICD-10-CM | POA: Diagnosis not present

## 2023-11-07 DIAGNOSIS — G6289 Other specified polyneuropathies: Secondary | ICD-10-CM

## 2023-11-07 LAB — CBC
HCT: 24.7 % — ABNORMAL LOW (ref 36.0–46.0)
Hemoglobin: 7.9 g/dL — ABNORMAL LOW (ref 12.0–15.0)
MCH: 38.2 pg — ABNORMAL HIGH (ref 26.0–34.0)
MCHC: 32 g/dL (ref 30.0–36.0)
MCV: 119.3 fL — ABNORMAL HIGH (ref 80.0–100.0)
Platelets: 93 10*3/uL — ABNORMAL LOW (ref 150–400)
RBC: 2.07 MIL/uL — ABNORMAL LOW (ref 3.87–5.11)
RDW: 18 % — ABNORMAL HIGH (ref 11.5–15.5)
WBC: 3.7 10*3/uL — ABNORMAL LOW (ref 4.0–10.5)
nRBC: 1.1 % — ABNORMAL HIGH (ref 0.0–0.2)

## 2023-11-07 LAB — BASIC METABOLIC PANEL WITH GFR
Anion gap: 9 (ref 5–15)
BUN: 9 mg/dL (ref 6–20)
CO2: 28 mmol/L (ref 22–32)
Calcium: 8.2 mg/dL — ABNORMAL LOW (ref 8.9–10.3)
Chloride: 105 mmol/L (ref 98–111)
Creatinine, Ser: 0.68 mg/dL (ref 0.44–1.00)
GFR, Estimated: >60 mL/min (ref >60)
Glucose, Bld: 82 mg/dL (ref 70–99)
Potassium: 3.4 mmol/L — ABNORMAL LOW (ref 3.5–5.1)
Sodium: 142 mmol/L (ref 135–145)

## 2023-11-07 LAB — GLUCOSE, CAPILLARY
Glucose-Capillary: 82 mg/dL (ref 70–99)
Glucose-Capillary: 146 mg/dL — ABNORMAL HIGH (ref 70–99)

## 2023-11-07 LAB — PREPARE RBC (CROSSMATCH)

## 2023-11-07 LAB — HEMOGLOBIN AND HEMATOCRIT, BLOOD
HCT: 28.9 % — ABNORMAL LOW (ref 36.0–46.0)
Hemoglobin: 9.8 g/dL — ABNORMAL LOW (ref 12.0–15.0)

## 2023-11-07 LAB — MAGNESIUM: Magnesium: 3 mg/dL — ABNORMAL HIGH (ref 1.7–2.4)

## 2023-11-07 MED ORDER — DULOXETINE HCL 30 MG PO CPEP: 60.0000 mg | ORAL_CAPSULE | Freq: Every day | ORAL | Status: DC

## 2023-11-07 MED ORDER — HEPARIN SOD (PORK) LOCK FLUSH 100 UNIT/ML IV SOLN: 500.0000 [IU] | INTRAVENOUS | Status: DC | PRN

## 2023-11-07 MED ORDER — HEPARIN SOD (PORK) LOCK FLUSH 100 UNIT/ML IV SOLN: 500.0000 [IU] | INTRAVENOUS | Status: DC

## 2023-11-07 MED ORDER — HEPARIN SOD (PORK) LOCK FLUSH 100 UNIT/ML IV SOLN
500.0000 [IU] | INTRAVENOUS | Status: DC | PRN
  Filled 2023-11-07: qty 5

## 2023-11-07 MED ORDER — SODIUM CHLORIDE 0.9% IV SOLUTION: Freq: Once | INTRAVENOUS | Status: AC

## 2023-11-07 MED ORDER — ONDANSETRON HCL 4 MG PO TABS: 4.0000 mg | ORAL_TABLET | Freq: Four times a day (QID) | ORAL | 0 refills | Status: DC | PRN

## 2023-11-07 MED ORDER — ASPIRIN 81 MG PO TBEC: 81.0000 mg | DELAYED_RELEASE_TABLET | Freq: Every day | ORAL | Status: DC

## 2023-11-07 MED ORDER — POLYETHYLENE GLYCOL 3350 17 G PO PACK: 17.0000 g | PACK | Freq: Every day | ORAL | 3 refills | Status: DC

## 2023-11-07 NOTE — Plan of Care (Signed)
   Problem: Nutritional: Goal: Maintenance of adequate nutrition will improve Outcome: Progressing Goal: Progress toward achieving an optimal weight will improve Outcome: Progressing

## 2023-11-07 NOTE — Plan of Care (Signed)
  Problem: Fluid Volume: Goal: Ability to maintain a balanced intake and output will improve Outcome: Progressing   Problem: Health Behavior/Discharge Planning: Goal: Ability to manage health-related needs will improve Outcome: Adequate for Discharge   Problem: Metabolic: Goal: Ability to maintain appropriate glucose levels will improve Outcome: Adequate for Discharge   Problem: Nutritional: Goal: Maintenance of adequate nutrition will improve Outcome: Adequate for Discharge

## 2023-11-07 NOTE — Progress Notes (Signed)
 [facility] Progress Note     Subjective: Patient seen and examined.  She is resting comfortably in bed.  She tolerated her solid food this morning without nausea and vomiting.  She confirms passing flatus and last had a bowel movement this morning.  She denies any blood in her bowel movements.  She also denies abdominal pain.  Objective: Vital signs in last 24 hours: Temp:  [98 F (36.7 C)-99.5 F (37.5 C)] 98.1 F (36.7 C) (06/05 0343) Pulse Rate:  [78-107] 78 (06/05 0832) Resp:  [18] 18 (06/05 0343) BP: (102-122)/(58-80) 120/58 (06/05 0832) SpO2:  [97 %-100 %] 100 % (06/05 0832) Last BM Date : 11/07/23  Intake/Output from previous day: 06/04 0701 - 06/05 0700 In: 580 [P.O.:480; IV Piggyback:100] Out: -  Intake/Output this shift: No intake/output data recorded.  General appearance: alert, cooperative, and no distress GI: Abdomen soft, nondistended, no percussion tenderness, nontender to palpation; no rigidity, guarding, rebound tenderness; right-sided ventral hernia soft and reducible, nontender  Lab Results:  Recent Labs    11/06/23 0340 11/07/23 0418  WBC 5.3 3.7*  HGB 8.4* 7.9*  HCT 25.1* 24.7*  PLT 92* 93*   BMET Recent Labs    11/06/23 0340 11/07/23 0418  NA 139 142  K 3.4* 3.4*  CL 105 105  CO2 27 28  GLUCOSE 75 82  BUN 9 9  CREATININE 0.62 0.68  CALCIUM  8.2* 8.2*   PT/INR No results for input(s): "LABPROT", "INR" in the last 72 hours.  Studies/Results: DG Abd Portable 1V-Small Bowel Obstruction Protocol-initial, 8 hr delay Result Date: 11/05/2023 CLINICAL DATA:  Small-bowel obstruction.  8 hour delayed image EXAM: PORTABLE ABDOMEN - 1 VIEW COMPARISON:  7:24 a.m. FINDINGS: Nasogastric tube tip overlies the gastric fundus. Administered oral contrast is now seen throughout a nondistended colon. Multiple gas-filled dilated loops of small bowel persist, however, the combination suggests an underlying distal partial small bowel obstruction  or resolving complete small bowel obstruction. Cholelithiasis. Pulmonary hypoinflation. IMPRESSION: 1. Administered oral contrast is now seen throughout a nondistended colon. 2. Persistent dilated loops of small bowel suggesting an underlying distal partial small bowel obstruction or resolving complete small bowel obstruction. Electronically Signed   By: Worthy Heads M.D.   On: 11/05/2023 21:52    Anti-infectives: Anti-infectives (From admission, onward)    None       Assessment/Plan:  Patient is a 53 year old female who was admitted with a small bowel obstruction.  -Tolerated her solid food this morning without nausea and vomiting -Continues to have bowel function -Patient stable for discharge from a surgical standpoint -Recommend bowel regimen as an outpatient to decrease risk of obstructive symptoms in the future -Hemoglobin slightly down trended today from 8.4 to 7.9.  Again I believe this is hemodilution, especially given she has no bloody bowel movements.  Would recommend the patient follow-up outpatient with her primary care doctor or oncology for repeat CBC and to discuss timing to resume oral anticoagulation -She will need to follow-up with GI in the future for colonoscopy.  Okay for colonoscopy in a couple of weeks assuming that her obstructive symptoms do not recur -PRN pain control and antiemetics -Appreciate hospitalist recommendations   LOS: 3 days    Jaziel Bennett A Floretta Petro 11/07/2023  Note: Portions of this report may have been transcribed using voice recognition software. Every effort has been made to ensure accuracy; however, inadvertent computerized transcription errors may still be present.

## 2023-11-07 NOTE — Consult Note (Signed)
 [facility] Oncology Progress Note  Name: Nicole Santos      MRN: [id number]    Location: A301/A301-01  Date: 11/07/2023 Time:9:50 AM   Subjective: Interval History:Nicole Santos is seen today on rounds.  She is sitting in the chair and eating breakfast.  She had full liquid diet last night.  She had a bowel movement this morning.  No vomiting reported.  No abdominal pain.  She did not have any blood in the bowel movements today.  She reports that she has some blood when she has soft stools for the last 4 to 6 weeks.  Objective: Vital signs in last 24 hours: Temp:  [98 F (36.7 C)-99.5 F (37.5 C)] 98.1 F (36.7 C) (06/05 0343) Pulse Rate:  [78-107] 78 (06/05 0832) Resp:  [18] 18 (06/05 0343) BP: (102-122)/(58-80) 120/58 (06/05 0832) SpO2:  [97 %-100 %] 100 % (06/05 0832)    Intake/Output from previous day: 06/04 0701 - 06/05 0700 In: 580 [P.O.:480] Out: -     Intake/Output this shift: No intake/output data recorded.   PHYSICAL EXAM: BP (!) 120/58   Pulse 78   Temp 98.1 F (36.7 C) (Oral)   Resp 18   Ht 5\' 2"  (1.575 m)   Wt 196 lb 10.4 oz (89.2 kg)   SpO2 100%   BMI 35.97 kg/m  General appearance: alert, cooperative, and appears stated age   Studies/Results: Results for orders placed or performed during the hospital encounter of 11/04/23 (from the past 48 hours)  Glucose, capillary     Status: None   Collection Time: 11/05/23 11:36 AM  Result Value Ref Range   Glucose-Capillary 92 70 - 99 mg/dL    Comment: Glucose reference range applies only to samples taken after fasting for at least 8 hours.   Comment 1 Notify RN    Comment 2 Document in Chart   Glucose, capillary     Status: None   Collection Time: 11/05/23  4:08 PM  Result Value Ref Range   Glucose-Capillary 75 70 - 99 mg/dL    Comment: Glucose reference range applies only to samples taken after fasting for at least 8 hours.   Comment 1 Notify RN    Comment 2 Document in Chart   Glucose, capillary      Status: None   Collection Time: 11/05/23  8:13 PM  Result Value Ref Range   Glucose-Capillary 74 70 - 99 mg/dL    Comment: Glucose reference range applies only to samples taken after fasting for at least 8 hours.  Glucose, capillary     Status: None   Collection Time: 11/05/23 11:43 PM  Result Value Ref Range   Glucose-Capillary 73 70 - 99 mg/dL    Comment: Glucose reference range applies only to samples taken after fasting for at least 8 hours.  Glucose, capillary     Status: None   Collection Time: 11/06/23  3:29 AM  Result Value Ref Range   Glucose-Capillary 79 70 - 99 mg/dL    Comment: Glucose reference range applies only to samples taken after fasting for at least 8 hours.  CBC     Status: Abnormal   Collection Time: 11/06/23  3:40 AM  Result Value Ref Range   WBC 5.3 4.0 - 10.5 K/uL   RBC 2.08 (L) 3.87 - 5.11 MIL/uL   Hemoglobin 8.4 (L) 12.0 - 15.0 g/dL   HCT 65.7 (L) 84.6 - 96.2 %   MCV 120.7 (H) 80.0 - 100.0 fL  MCH 40.4 (H) 26.0 - 34.0 pg   MCHC 33.5 30.0 - 36.0 g/dL   RDW 01.0 (H) 27.2 - 53.6 %   Platelets 92 (L) 150 - 400 K/uL    Comment: SPECIMEN CHECKED FOR CLOTS Immature Platelet Fraction may be clinically indicated, consider ordering this additional test [id number] REPEATED TO VERIFY PLATELET COUNT CONFIRMED BY SMEAR    nRBC 0.8 (H) 0.0 - 0.2 %    Comment: Performed at [facility], [address], [place], [name] [address]  Magnesium      Status: Abnormal   Collection Time: 11/06/23  3:40 AM  Result Value Ref Range   Magnesium  1.6 (L) 1.7 - 2.4 mg/dL    Comment: Performed at [facility], [address], [place], [name] [address]  Comprehensive metabolic panel with GFR     Status: Abnormal   Collection Time: 11/06/23  3:40 AM  Result Value Ref Range   Sodium 139 135 - 145 mmol/L   Potassium 3.4 (L) 3.5 - 5.1 mmol/L   Chloride 105 98 - 111 mmol/L   CO2 27 22 - 32 mmol/L   Glucose, Bld 75 70 - 99 mg/dL    Comment: Glucose reference range applies only to  samples taken after fasting for at least 8 hours.   BUN 9 6 - 20 mg/dL   Creatinine, Ser 4.33 0.44 - 1.00 mg/dL   Calcium  8.2 (L) 8.9 - 10.3 mg/dL   Total Protein 5.3 (L) 6.5 - 8.1 g/dL   Albumin 2.6 (L) 3.5 - 5.0 g/dL   AST 14 (L) 15 - 41 U/L   ALT 10 0 - 44 U/L   Alkaline Phosphatase 42 38 - 126 U/L   Total Bilirubin 1.6 (H) 0.0 - 1.2 mg/dL   GFR, Estimated >29 >51 mL/min    Comment: (NOTE) Calculated using the CKD-EPI Creatinine Equation (2021)    Anion gap 7 5 - 15    Comment: Performed at [facility], [address] [place], [name] [address]  Iron and TIBC     Status: Abnormal   Collection Time: 11/06/23  5:03 AM  Result Value Ref Range   Iron 55 28 - 170 ug/dL   TIBC 606 (L) 301 - 601 ug/dL   Saturation Ratios 29 10.4 - 31.8 %   UIBC 134 ug/dL    Comment: Performed at [facility], [address], [place], [name] [address]  Ferritin     Status: None   Collection Time: 11/06/23  5:03 AM  Result Value Ref Range   Ferritin 271 11 - 307 ng/mL    Comment: Performed at [facility], [address] [place], [name] [address]  Glucose, capillary     Status: None   Collection Time: 11/06/23  8:05 AM  Result Value Ref Range   Glucose-Capillary 78 70 - 99 mg/dL    Comment: Glucose reference range applies only to samples taken after fasting for at least 8 hours.   Comment 1 Notify RN    Comment 2 Document in Chart   Glucose, capillary     Status: None   Collection Time: 11/06/23 11:01 AM  Result Value Ref Range   Glucose-Capillary 88 70 - 99 mg/dL    Comment: Glucose reference range applies only to samples taken after fasting for at least 8 hours.   Comment 1 Notify RN    Comment 2 Document in Chart   Glucose, capillary     Status: None   Collection Time: 11/06/23  4:18 PM  Result Value Ref Range   Glucose-Capillary 98 70 - 99 mg/dL    Comment: Glucose reference range applies only to samples taken after fasting for at least 8 hours.   Comment 1 Notify RN    Comment 2  Document in Chart   Glucose, capillary     Status: None   Collection Time: 11/06/23  8:59 PM  Result Value Ref Range   Glucose-Capillary 93 70 - 99 mg/dL    Comment: Glucose reference range applies only to samples taken after fasting for at least 8 hours.  CBC     Status: Abnormal   Collection Time: 11/07/23  4:18 AM  Result Value Ref Range   WBC 3.7 (L) 4.0 - 10.5 K/uL   RBC 2.07 (L) 3.87 - 5.11 MIL/uL   Hemoglobin 7.9 (L) 12.0 - 15.0 g/dL   HCT 56.2 (L) 13.0 - 86.5 %   MCV 119.3 (H) 80.0 - 100.0 fL   MCH 38.2 (H) 26.0 - 34.0 pg   MCHC 32.0 30.0 - 36.0 g/dL   RDW 78.4 (H) 69.6 - 29.5 %   Platelets 93 (L) 150 - 400 K/uL    Comment: Immature Platelet Fraction may be clinically indicated, consider ordering this additional test [id number] REPEATED TO VERIFY PLATELET COUNT CONFIRMED BY SMEAR    nRBC 1.1 (H) 0.0 - 0.2 %    Comment: Performed at [facility], [address], [place], [name] [address]  Basic metabolic panel with GFR     Status: Abnormal   Collection Time: 11/07/23  4:18 AM  Result Value Ref Range   Sodium 142 135 - 145 mmol/L   Potassium 3.4 (L) 3.5 - 5.1 mmol/L   Chloride 105 98 - 111 mmol/L   CO2 28 22 - 32 mmol/L   Glucose, Bld 82 70 - 99 mg/dL    Comment: Glucose reference range applies only to samples taken after fasting for at least 8 hours.   BUN 9 6 - 20 mg/dL   Creatinine, Ser 2.53 0.44 - 1.00 mg/dL   Calcium  8.2 (L) 8.9 - 10.3 mg/dL   GFR, Estimated >66 >44 mL/min    Comment: (NOTE) Calculated using the CKD-EPI Creatinine Equation (2021)    Anion gap 9 5 - 15    Comment: Performed at [facility], [address], [place], [name] [address]  Magnesium      Status: Abnormal   Collection Time: 11/07/23  4:18 AM  Result Value Ref Range   Magnesium  3.0 (H) 1.7 - 2.4 mg/dL    Comment: Performed at [facility], [address], [place], [name] [address]  Glucose, capillary     Status: None   Collection Time: 11/07/23  7:28 AM  Result Value Ref Range    Glucose-Capillary 82 70 - 99 mg/dL    Comment: Glucose reference range applies only to samples taken after fasting for at least 8 hours.   Comment 1 Notify RN    Comment 2 Document in Chart    DG Abd Portable 1V-Small Bowel Obstruction Protocol-initial, 8 hr delay Result Date: 11/05/2023 CLINICAL DATA:  Small-bowel obstruction.  8 hour delayed image EXAM: PORTABLE ABDOMEN - 1 VIEW COMPARISON:  7:24 a.m. FINDINGS: Nasogastric tube tip overlies the gastric fundus. Administered oral contrast is now seen throughout a nondistended colon. Multiple gas-filled dilated loops of small bowel persist, however, the combination suggests an underlying distal partial small bowel obstruction or resolving complete small bowel obstruction. Cholelithiasis. Pulmonary hypoinflation. IMPRESSION: 1. Administered oral contrast is now  seen throughout a nondistended colon. 2. Persistent dilated loops of small bowel suggesting an underlying distal partial small bowel obstruction or resolving complete small bowel obstruction. Electronically Signed   By: Worthy Heads M.D.   On: 11/05/2023 21:52     MEDICATIONS: I have reviewed the patient's current medications.     Assessment/Plan:  1.  Partial small bowel obstruction: - NG tube was removed in the morning of 11/06/2023.  She was started on clear liquids yesterday morning which was advanced to full liquid diet last night.  She is tolerating it well.  She is on regular diet this morning. - Management per surgery.  2.  High-grade serous peritoneal carcinoma: - On maintenance Lynparza  and bevacizumab .  Last bevacizumab  on 10/14/2023. - CTAP (10/24/2023): No convincing evidence of active malignancy. - Lynparza  on hold since hospitalization.  May resume after discharge.  3.  Normocytic anemia: - Hemoglobin on admission was 11.6, down to 7.9, most likely from hemodilution.  She also has intermittent bloody stools when she has soft stools.  She is currently on heparin  drip. - Will  give her 1 unit PRBC today. - Will hold Eliquis  at the time of discharge.  She was told to take aspirin  81 mg daily.  I will reevaluate her next week in the office with repeat CBC.  If it is stable, I will start her back on Eliquis . - Discussed with Dr. Lincoln Renshaw.  All questions were answered. The patient knows to call the clinic with any problems, questions or concerns. We can certainly see the patient much sooner if necessary.     Zenda Herskowitz

## 2023-11-07 NOTE — Discharge Instructions (Signed)
 PLEASE DO NOT TAKE ELIQUIS  (APIXABAN ) UNTIL YOU HAVE FOLLOWED UP WITH DR. KATRAGADDA FOR REPEAT LABS AND HE HAS TOLD YOU THAT IT CAN BE SAFELY RESTARTED.     IMPORTANT INFORMATION: PAY CLOSE ATTENTION   PHYSICIAN DISCHARGE INSTRUCTIONS  Follow with Primary care provider  Eliodoro Guerin, DO  and other consultants as instructed by your Hospitalist Physician  SEEK MEDICAL CARE OR RETURN TO EMERGENCY ROOM IF SYMPTOMS COME BACK, WORSEN OR NEW PROBLEM DEVELOPS   Please note: You were cared for by a hospitalist during your hospital stay. Every effort will be made to forward records to your primary care provider.  You can request that your primary care provider send for your hospital records if they have not received them.  Once you are discharged, your primary care physician will handle any further medical issues. Please note that NO REFILLS for any discharge medications will be authorized once you are discharged, as it is imperative that you return to your primary care physician (or establish a relationship with a primary care physician if you do not have one) for your post hospital discharge needs so that they can reassess your need for medications and monitor your lab values.  Please get a complete blood count and chemistry panel checked by your Primary MD at your next visit, and again as instructed by your Primary MD.  Get Medicines reviewed and adjusted: Please take all your medications with you for your next visit with your Primary MD  Laboratory/radiological data: Please request your Primary MD to go over all hospital tests and procedure/radiological results at the follow up, please ask your primary care provider to get all Hospital records sent to his/her office.  In some cases, they will be blood work, cultures and biopsy results pending at the time of your discharge. Please request that your primary care provider follow up on these results.  If you are diabetic, please bring your  blood sugar readings with you to your follow up appointment with primary care.    Please call and make your follow up appointments as soon as possible.    Also Note the following: If you experience worsening of your admission symptoms, develop shortness of breath, life threatening emergency, suicidal or homicidal thoughts you must seek medical attention immediately by calling 911 or calling your MD immediately  if symptoms less severe.  You must read complete instructions/literature along with all the possible adverse reactions/side effects for all the Medicines you take and that have been prescribed to you. Take any new Medicines after you have completely understood and accpet all the possible adverse reactions/side effects.   Do not drive when taking Pain medications or sleeping medications (Benzodiazepines)  Do not take more than prescribed Pain, Sleep and Anxiety Medications. It is not advisable to combine anxiety,sleep and pain medications without talking with your primary care practitioner  Special Instructions: If you have smoked or chewed Tobacco  in the last 2 yrs please stop smoking, stop any regular Alcohol  and or any Recreational drug use.  Wear Seat belts while driving.  Do not drive if taking any narcotic, mind altering or controlled substances or recreational drugs or alcohol.

## 2023-11-07 NOTE — Discharge Summary (Signed)
 Physician Discharge Summary  Nicole Santos ZOX:[id number] DOB: 28-Jun-1970 DOA: 11/04/2023  PCP: Eliodoro Guerin, DO Oncologist: Dr. Cheree Cords GI: [facility]   Admit date: 11/04/2023 Discharge date: 11/07/2023  Admitted From:  Home  Disposition: Home   Recommendations for Outpatient Follow-up:  Follow up with Dr Cheree Cords next week for repeat CBC  Please hold apixaban  until follow up with Dr. Linnell Richardson next week Please follow up with Dr. General Kenner to arrange for a colonoscopy asap  Discharge Condition: STABLE   CODE STATUS: FULL DIET: soft foods, advance as tolerated, use daily bowel regimen   Brief Hospitalization Summary: Please see all hospital notes, images, labs for full details of the hospitalization. Admission provider HPI:  53 y.o. female with medical history significant of High-grade serous carcinoma of peritoneum, peripheral neuropathy, pulmonary embolism, hyperlipidemia who presents to the emergency department due to diarrhea, diffuse abdominal pain, vomiting.  Patient has been admitted for evaluation of small bowel obstruction and general surgery consulted.  She is undergoing conservative measures and is not a candidate for surgical management.   Hospital Course by problem list   Small bowel obstruction in the setting of adhesions with peritoneal carcinomatosis NG has been removed and patient feeling better Pt is tolerating soft diet and having bowel movements  Appreciate further general surgery recommendations, reported patient is stable to DC today Continue daily bowel regimen as ordered   Abdominal pain possibly due to ventral hernia CT abdomen pelvis with contrast showed small l right paramidline periumbilical ventral hernia a loop of small bowel,now with interval fluid. General surgery was consulted   Macrocytic anemia B12 and folate within normal limits Pt reporting rectal bleeding of small amounts GI consult requested--recommended pt to pursue outpatient colonoscopy  with her primary GI  Holding enoxaparin  and apixaban  Pt was seen by Dr Linnell Richardson and plan now is to continue to  hold apixaban  for now, transfuse 1 unit PRBC, Dr. Linnell Richardson will see patient next week in the office for repeat CBC and if stable she will be able to restart apixaban .  He recommended sending home on aspirin  81 mg daily for now until she follows up with him in the office next week.  Surgery team requesting no endoscopies for now given she is recovering from SBO but reported she could have a colonoscopy in next couple of weeks if no recurrence of bowel obstruction.   Pt was strongly advised to schedule outpatient GI appt for colonoscopy   Prediabetes with hyperglycemia Hemoglobin A1c on 07/03/2023 was 5.7 Diet controlled   Mixed hyperlipidemia Continue home meds    Hypomagnesemia - repleted   Pulmonary embolism Had been on Lovenox  full dose while apixaban  was on hold   As noted above, DC on aspirin  81 mg daily, hold apixaban   until follow up with Dr. Linnell Richardson next week for repeat CBC If Hg stable, Dr Linnell Richardson will restart apixaban  next week    High-grade serous carcinoma of peritoneum Patient follows with Dr. Cheree Cords, seen this admission See consult note from Dr. Linnell Richardson Can resume lynparza  at discharge per Dr Linnell Richardson    Peripheral neuropathy Continue home meds    Obesity, class II BMI 37.9  Discharge Diagnoses:  Principal Problem:   Small bowel obstruction (HCC) Active Problems:   Pulmonary embolism (HCC)   Mixed hyperlipidemia   Abdominal pain   Ventral hernia   Macrocytic anemia   Hypomagnesemia   Prediabetes   Hyperglycemia   Peripheral neuropathy   High grade serous carcinoma [facility])   Discharge Instructions:  Allergies as of 11/07/2023       Reactions   Augmentin  [amoxicillin -pot Clavulanate] Diarrhea, Nausea And Vomiting   Doxycycline    Abdominal cramps.   Topamax  [topiramate ] Nausea And Vomiting        Medication List     STOP taking these medications    apixaban  5 MG Tabs  tablet Commonly known as: ELIQUIS        TAKE these medications    aspirin  EC 81 MG tablet Take 1 tablet (81 mg total) by mouth daily. Swallow whole.   atorvastatin  20 MG tablet Commonly known as: LIPITOR Take 1 tablet (20 mg total) by mouth at bedtime.   diphenoxylate -atropine  2.5-0.025 MG tablet Commonly known as: LOMOTIL  Take 2 tablets by mouth after the first watery bowel movement. May then take 1 pill by mouth after each watery bowel movement. Do not exceed 8 pills in a 24 hour period.   DULoxetine  30 MG capsule Commonly known as: CYMBALTA  Take 2 capsules (60 mg total) by mouth daily. What changed: See the new instructions.   lidocaine -prilocaine  cream Commonly known as: EMLA  Apply a quarter sized amount to port a cath site and cover with plastic wrap 1 hour prior to infusion appointments What changed:  how much to take how to take this when to take this   Lynparza  150 MG tablet Generic drug: olaparib  TAKE 2 TABLETS TWICE A DAY. MAY TAKE WITH FOOD TO DECEASE NAUSEA AND VOMITING. (SWALLOW WHOLE) What changed: See the new instructions.   magnesium  oxide 400 (240 Mg) MG tablet Commonly known as: MAG-OX TAKE 1 TABLET BY MOUTH TWICE A DAY   multivitamin capsule Take 1 capsule by mouth daily.   ondansetron  4 MG tablet Commonly known as: ZOFRAN  Take 1 tablet (4 mg total) by mouth every 6 (six) hours as needed for nausea.   polyethylene glycol 17 g packet Commonly known as: MIRALAX  / GLYCOLAX  Take 17 g by mouth daily. Start taking on: November 08, 2023   Potassium 99 MG Tabs Take 99 mg by mouth at bedtime.        Follow-up Information     Paulett Boros, MD. Schedule an appointment as soon as possible for a visit in 1 week(s).   Specialty: Hematology Why: Hospital Follow Up and recheck labs Contact information: [address] [name] [address] [phone]         Ace Holder, MD. Schedule an appointment as soon as possible for a visit  in 1 week(s).   Specialty: Gastroenterology Why: Hospital Follow Up Contact information: [address] [name] [address] [phone]                Allergies  Allergen Reactions   Augmentin  [Amoxicillin -Pot Clavulanate] Diarrhea and Nausea And Vomiting   Doxycycline     Abdominal cramps.   Topamax  [Topiramate ] Nausea And Vomiting   Allergies as of 11/07/2023       Reactions   Augmentin  [amoxicillin -pot Clavulanate] Diarrhea, Nausea And Vomiting   Doxycycline    Abdominal cramps.   Topamax  [topiramate ] Nausea And Vomiting        Medication List     STOP taking these medications    apixaban  5 MG Tabs tablet Commonly known as: ELIQUIS        TAKE these medications    aspirin  EC 81 MG tablet Take 1 tablet (81 mg total) by mouth daily. Swallow whole.   atorvastatin  20 MG tablet Commonly known as: LIPITOR Take 1 tablet (  20 mg total) by mouth at bedtime.   diphenoxylate -atropine  2.5-0.025 MG tablet Commonly known as: LOMOTIL  Take 2 tablets by mouth after the first watery bowel movement. May then take 1 pill by mouth after each watery bowel movement. Do not exceed 8 pills in a 24 hour period.   DULoxetine  30 MG capsule Commonly known as: CYMBALTA  Take 2 capsules (60 mg total) by mouth daily. What changed: See the new instructions.   lidocaine -prilocaine  cream Commonly known as: EMLA  Apply a quarter sized amount to port a cath site and cover with plastic wrap 1 hour prior to infusion appointments What changed:  how much to take how to take this when to take this   Lynparza  150 MG tablet Generic drug: olaparib  TAKE 2 TABLETS TWICE A DAY. MAY TAKE WITH FOOD TO DECEASE NAUSEA AND VOMITING. (SWALLOW WHOLE) What changed: See the new instructions.   magnesium  oxide 400 (240 Mg) MG tablet Commonly known as: MAG-OX TAKE 1 TABLET BY MOUTH TWICE A DAY   multivitamin capsule Take 1 capsule by mouth daily.   ondansetron  4 MG tablet Commonly  known as: ZOFRAN  Take 1 tablet (4 mg total) by mouth every 6 (six) hours as needed for nausea.   polyethylene glycol 17 g packet Commonly known as: MIRALAX  / GLYCOLAX  Take 17 g by mouth daily. Start taking on: November 08, 2023   Potassium 99 MG Tabs Take 99 mg by mouth at bedtime.        Procedures/Studies: DG Abd Portable 1V-Small Bowel Obstruction Protocol-initial, 8 hr delay Result Date: 11/05/2023 CLINICAL DATA:  Small-bowel obstruction.  8 hour delayed image EXAM: PORTABLE ABDOMEN - 1 VIEW COMPARISON:  7:24 a.m. FINDINGS: Nasogastric tube tip overlies the gastric fundus. Administered oral contrast is now seen throughout a nondistended colon. Multiple gas-filled dilated loops of small bowel persist, however, the combination suggests an underlying distal partial small bowel obstruction or resolving complete small bowel obstruction. Cholelithiasis. Pulmonary hypoinflation. IMPRESSION: 1. Administered oral contrast is now seen throughout a nondistended colon. 2. Persistent dilated loops of small bowel suggesting an underlying distal partial small bowel obstruction or resolving complete small bowel obstruction. Electronically Signed   By: Worthy Heads M.D.   On: 11/05/2023 21:52   DG Abd 1 View Result Date: 11/05/2023 CLINICAL DATA:  Small bowel obstruction. EXAM: ABDOMEN - 1 VIEW COMPARISON:  11/04/2023 and CT abdomen pelvis 11/04/2023 FINDINGS: Nasogastric tube is in the stomach body region. Evidence for calcified gallstones. There continues to be dilated loops of bowel in the abdomen and no significant gas in the rectum. Limited evaluation for free air on this supine examination. IMPRESSION: 1. Persistent dilated loops of bowel in the abdomen. Findings remain compatible with small bowel obstruction. Minimal change from the CT topogram on 11/04/2023. 2. Nasogastric tube is in the stomach body region. 3. Cholelithiasis. Electronically Signed   By: Elene Griffes M.D.   On: 11/05/2023 11:36   DG Abd  Portable 1 View Result Date: 11/04/2023 CLINICAL DATA:  NG tube placement EXAM: PORTABLE ABDOMEN - 1 VIEW COMPARISON:  11/03/2021 FINDINGS: NG tube tip in the stomach. Mildly dilated small bowel loops noted. Lung bases clear. IMPRESSION: NG tube tip in the stomach. Electronically Signed   By: Janeece Mechanic M.D.   On: 11/04/2023 12:55   CT ABDOMEN PELVIS W CONTRAST Result Date: 11/04/2023 EXAM: CT ABDOMEN AND PELVIS WITH CONTRAST 11/04/2023 03:20:52 AM TECHNIQUE: CT of the abdomen and pelvis was performed with the administration of iohexol  (OMNIPAQUE ) 300 MG/ML  solution. Multiplanar reformatted images are provided for review. Automated exposure control, iterative reconstruction, and/or weight based adjustment of the mA/kV was utilized to reduce the radiation dose to as low as reasonably achievable. COMPARISON: 10/24/2023 CLINICAL HISTORY: Bowel obstruction suspected; history of peritoneal carcinomatosis, hernia and gall stones. Vomiting and diarrhea and feeling of fullness in abdomen at all times. Patient is a cancer patient and has been seen for this in the past by her cancer doctor but feels as if something else is "going on". FINDINGS: LOWER CHEST: No acute abnormality. LIVER: The liver is unremarkable. GALLBLADDER AND BILE DUCTS: Cholelithiasis, without associated inflammatory changes. SPLEEN: No acute abnormality. PANCREAS: No acute abnormality. ADRENAL GLANDS: No acute abnormality. KIDNEYS, URETERS AND BLADDER: 2.6 cm simple left upper pole renal cyst (image 36), benign (Bosniak 1). No follow-up is recommended. No stones in the kidneys or ureters. No hydronephrosis. No perinephric or periureteral stranding. Urinary bladder is unremarkable. GI AND BOWEL: Small right paramidline periumbilical ventral hernia containing a knuckle of mildly dilated small bowel with interval fluid (image 61). Dilated small bowel mildly progressive with narrowing/transition of the distal ileum (image 67), possibly on the basis  of adhesions. No appendicitis. PERITONEUM AND RETROPERITONEUM: Vague 11 mm peritoneal nodule beneath the right mid anterior abdominal wall (image 41), equivocal, although warranting attention on follow-up to exclude a peritoneal implant. No omental caking. Very mild mesenteric stranding/fluid in the right mid abdomen (image 50). No free air. VASCULATURE: Aorta is normal in caliber. LYMPH NODES: No lymphadenopathy. REPRODUCTIVE ORGANS: No acute abnormality. BONES AND SOFT TISSUES: Mild degenerative changes of the lower thoracic spine. No acute osseous abnormality. No focal soft tissue abnormality. IMPRESSION: 1. Suspected small bowel obstruction with narrowing/transition of the distal ileum, possibly on the basis of adhesions. 2. Small right paramidline periumbilical ventral hernia a loop of small bowel, now with interval fluid. Correlate for reducibility. 3. Vague 11 mm peritoneal nodule beneath the right mid anterior abdominal wall, equivocal. Attention on follow-up is suggested. 4. Cholelithiasis without associated inflammatory changes. Electronically signed by: Zadie Herter MD 11/04/2023 03:33 AM EDT RP Workstation: [id number]   CT ABDOMEN PELVIS W CONTRAST Result Date: 10/24/2023 EXAMINATION: CT ABDOMEN PELVIS W CONTRAST CLINICAL INDICATION: Female, 53 years old. Ovarian cancer, monitor TECHNIQUE: Axial CT of the abdomen and pelvis with 100 cc Omnipaque  300 intravenous contrast. Multiplanar reformations provided. Unless otherwise specified, incidental thyroid , adrenal, renal lesions do not require dedicated imaging follow up. Additionally, any mentioned pulmonary nodules do not require dedicated imaging follow-up based on the Fleischner guidelines unless otherwise specified. Coronary calcifications are not identified unless otherwise specified. COMPARISON: 08/20/2023 FINDINGS: Lumbar lung bases are clear. The heart is normal in size. The liver appears normal. There is cholelithiasis. The spleen is normal.  The pancreas is normal. The adrenals are normal. The right kidney is normal. Left renal cyst is seen. The abdominal aorta is normal in caliber. Scattered atherosclerotic changes are present in the left common iliac vein stent noted. There are findings of cystitis which may be posttreatment related. There is a fiducial marker suspected at the level of the cervix with suspected posttreatment changes of the uterus which appears atrophic. There is rectal wall thickening concerning for proctitis likely posttreatment related. There is a small periumbilical hernia containing a small bowel loop. Large and small bowel loops are otherwise within normal limits. There is no free fluid or lymphadenopathy. No concerning peritoneal nodularity is appreciated. There is diffuse osseous demineralization. There are degenerative changes of the spine and bony pelvis.  IMPRESSION: Posttreatment changes in the pelvis with no convincing evidence for active malignancy within the abdomen or pelvis. DOSE REDUCTION: This exam was performed according to our departmental dose-optimization program which includes automated exposure control, adjustment of the mA and/or kV according to patient size and/or use of iterative reconstruction technique. Electronically signed by: [place] Engel MD 10/24/2023 05:44 PM EDT RP Workstation: [id number]     Subjective: Pt reports she has been tolerating soft diet and had a bowel movement this morning.  She reports only minimal amounts of blood seen with stool last night, no blood seen with BM this morning.   She is willing to follow up with her GI Dr. General Kenner  Discharge Exam: Vitals:   11/07/23 0832 11/07/23 1134  BP: (!) 120/58 101/67  Pulse: 78 98  Resp:  16  Temp:  98.6 F (37 C)  SpO2: 100%    Vitals:   11/06/23 2104 11/07/23 0343 11/07/23 0832 11/07/23 1134  BP: 122/71 118/75 (!) 120/58 101/67  Pulse: 78 82 78 98  Resp: 18 18  16   Temp: 98 F (36.7 C) 98.1 F (36.7 C)  98.6 F (37 C)   TempSrc: Oral Oral  Oral  SpO2: 100% 99% 100%   Weight:      Height:       General: Pt is alert, sitting up in chair, awake, not in acute distress Cardiovascular: normal S1/S2 +, no rubs, no gallops Respiratory: CTA bilaterally, no wheezing, no rhonchi Abdominal: Soft, NT, ND, bowel sounds + Extremities: no edema, no cyanosis   The results of significant diagnostics from this hospitalization (including imaging, microbiology, ancillary and laboratory) are listed below for reference.     Microbiology: No results found for this or any previous visit (from the past 240 hours).   Labs: BNP (last 3 results) No results for input(s): "BNP" in the last 8760 hours. Basic Metabolic Panel: Recent Labs  Lab 11/04/23 0233 11/05/23 0359 11/06/23 0340 11/07/23 0418  NA 138 136 139 142  K 3.5 3.5 3.4* 3.4*  CL 103 105 105 105  CO2 20* 25 27 28   GLUCOSE 194* 93 75 82  BUN 15 11 9 9   CREATININE 0.98 0.71 0.62 0.68  CALCIUM  9.6 8.3* 8.2* 8.2*  MG 1.5* 2.0 1.6* 3.0*  PHOS  --  3.7  --   --    Liver Function Tests: Recent Labs  Lab 11/04/23 0233 11/05/23 0359 11/06/23 0340  AST 25 18 14*  ALT 13 11 10   ALKPHOS 58 47 42  BILITOT 1.9* 1.4* 1.6*  PROT 7.0 5.9* 5.3*  ALBUMIN 3.7 3.0* 2.6*   Recent Labs  Lab 11/04/23 0233  LIPASE 26   No results for input(s): "AMMONIA" in the last 168 hours. CBC: Recent Labs  Lab 11/04/23 0233 11/05/23 0359 11/06/23 0340 11/07/23 0418  WBC 10.6* 4.9 5.3 3.7*  HGB 11.6* 9.4* 8.4* 7.9*  HCT 33.1* 28.2* 25.1* 24.7*  MCV 114.9* 121.0* 120.7* 119.3*  PLT 133* 126* 92* 93*   Cardiac Enzymes: No results for input(s): "CKTOTAL", "CKMB", "CKMBINDEX", "TROPONINI" in the last 168 hours. BNP: Invalid input(s): "POCBNP" CBG: Recent Labs  Lab 11/06/23 1101 11/06/23 1618 11/06/23 2059 11/07/23 0728 11/07/23 1115  GLUCAP 88 98 93 82 146*   D-Dimer No results for input(s): "DDIMER" in the last 72 hours. Hgb A1c No results for input(s):  "HGBA1C" in the last 72 hours. Lipid Profile No results for input(s): "CHOL", "HDL", "LDLCALC", "TRIG", "CHOLHDL", "LDLDIRECT" in the last 72 hours.  Thyroid  function studies No results for input(s): "TSH", "T4TOTAL", "T3FREE", "THYROIDAB" in the last 72 hours.  Invalid input(s): "FREET3" Anemia work up Recent Labs    11/06/23 0503  FERRITIN 271  TIBC 189*  IRON 55   Urinalysis    Component Value Date/Time   COLORURINE YELLOW 11/04/2023 0511   APPEARANCEUR CLEAR 11/04/2023 0511   APPEARANCEUR Clear 11/03/2021 1613   LABSPEC RESULTS UNAVAILABLE DUE TO INTERFERING SUBSTANCE 11/04/2023 0511   PHURINE 8.0 11/04/2023 0511   GLUCOSEU NEGATIVE 11/04/2023 0511   HGBUR NEGATIVE 11/04/2023 0511   BILIRUBINUR NEGATIVE 11/04/2023 0511   BILIRUBINUR Negative 11/03/2021 1613   KETONESUR 5 (A) 11/04/2023 0511   PROTEINUR 30 (A) 11/04/2023 0511   NITRITE NEGATIVE 11/04/2023 0511   LEUKOCYTESUR TRACE (A) 11/04/2023 0511   Sepsis Labs Recent Labs  Lab 11/04/23 0233 11/05/23 0359 11/06/23 0340 11/07/23 0418  WBC 10.6* 4.9 5.3 3.7*   Microbiology No results found for this or any previous visit (from the past 240 hours).  Time coordinating discharge:  40 mins  SIGNED:  Faustino Hook, MD  [facility] 11/07/2023, 11:38 AM How to contact the [facility] Attending or Consulting provider 7A - 7P or covering provider during after hours 7P -7A, for this patient?  Check the care team in [facility] and look for a) attending/consulting [facility] provider listed and b) the [facility] team listed Log into [email or web address] and use [place] universal password to access. If you do not have the password, please contact the hospital operator. Locate the [facility] provider you are looking for under [facility] and page to a number that you can be directly reached. If you still have difficulty reaching the provider, please page the [facility] (Director on Call) for the Hospitalists listed on amion for assistance.

## 2023-11-08 ENCOUNTER — Telehealth: Payer: Self-pay | Admitting: *Deleted

## 2023-11-08 LAB — BPAM RBC
Blood Product Expiration Date: 202506162359
ISSUE DATE / TIME: 202506051128
Unit Type and Rh: 1700

## 2023-11-08 LAB — TYPE AND SCREEN
ABO/RH(D): B NEG
Antibody Screen: NEGATIVE
Unit division: 0

## 2023-11-08 NOTE — Transitions of Care (Post Inpatient/ED Visit) (Signed)
   11/08/2023  Name: HALEEMAH BUCKALEW MRN: [id number] DOB: Feb 17, 1971  Today's TOC FU Call Status: Today's TOC FU Call Status:: Unsuccessful Call (1st Attempt) Unsuccessful Call (1st Attempt) Date: 11/08/23  Attempted to reach the patient regarding the most recent Inpatient/ED visit.  Follow Up Plan: Additional outreach attempts will be made to reach the patient to complete the Transitions of Care (Post Inpatient/ED visit) call.   Cecilie Coffee [facility], BSN RN Care Manager/ Transition of Care [place]/ [facility] [phone]

## 2023-11-11 ENCOUNTER — Telehealth: Payer: Self-pay

## 2023-11-11 NOTE — Transitions of Care (Post Inpatient/ED Visit) (Signed)
   11/11/2023  Name: Nicole Santos MRN: [id number] DOB: 1971/03/29  Today's TOC FU Call Status: Today's TOC FU Call Status:: Successful TOC FU Call Completed TOC FU Call Complete Date:  (Spoke with patient who said she does not feel she needs the call and has her appointment scheduled with "Dr Linnell Richardson". TOC RN discussed hospital follow up also with PCP and offered to assist with getting an appointment scheduled and patient declined.) Patient's Name and Date of Birth confirmed.  Tonia Frankel RN, CCM [place]  [facility]Population [facility] RN Care Manager [phone]

## 2023-11-12 DIAGNOSIS — D62 Acute posthemorrhagic anemia: Secondary | ICD-10-CM

## 2023-11-12 DIAGNOSIS — K625 Hemorrhage of anus and rectum: Secondary | ICD-10-CM

## 2023-11-12 NOTE — Progress Notes (Signed)
 [facility] [address] [name] [address]    Clinic Day:  11/13/2023  Referring physician: Eliodoro Guerin, DO  Patient Care Team: Nicole Guerin, DO as PCP - General (Family Medicine) Nicole Mailman, Santos as Referring Physician (Optometry) Nicole Boros, Santos as Medical Oncologist (Medical Oncology) Nicole Knuckles, RN as Oncology Nurse Navigator (Medical Oncology)   ASSESSMENT & PLAN:   Assessment: 1. High-grade serous carcinoma of peritoneum: - She had developed sharp left-sided abdominal pain since Mother's Day, occasional radiating to the back.  40 pound weight loss in the last 6 months. - Personal history of cervical cancer in 2000, status post chemo plus XRT at [facility].  She has slightly decreased hearing in the left ear since then. - US  abdomen (11/08/2021): Cholelithiasis with gallbladder wall thickening and sonographic Murphy sign.  Upper abdominal ascites. - Laparoscopy (11/13/2021): Starting of the omentum as well as the upper abdominal wall and diaphragm.  Liver was within normal limits.  6 L of ascites was evacuated.  Omental and peritoneal biopsies were sent.  Peritoneal implants throughout and a significant portion of omentum and on the small intestine. - CT CAP (11/13/2021): Ascites, extensive peritoneal thickening, nodularity and omental caking.  No evidence of primary malignancy in the chest, abdomen or pelvis.  Small left pleural effusion, nonspecific.  Cholelithiasis. - US  pelvis (11/14/2021): Uterus is grossly unremarkable.  Ovaries not visualized. - CA125 (11/13/2021): 1269 - Pathology: Peritoneum and omental biopsy consistent with high-grade serous carcinoma.  HER2 2+ by IHC and negative by FISH. - 4 cycles of carboplatin  and paclitaxel  from 11/21/2021 through 01/23/2022, cycle 9 on 08/29/2022. - 10/01/2022: Diagnostic laparoscopy, exploratory laparotomy, omentectomy - Pathology: Right pelvic sidewall excision-high-grade  serous carcinoma, omental resection-high-grade serous carcinoma with treatment effect present.  Small bowel implant excision was negative for carcinoma. - Germline mutation testing negative - Foundation 1 NGS (10/01/2022): LOH-18.9%, HRD +, MS-stable, TMB-low - Olaparib  300 mg twice daily and bevacizumab  maintenance started on 01/07/2023.  Nicole Santos  held when she was hospitalized from 11/04/2023 through 11/07/2023 with partial SBO.  2. Social/family history: - Lives at home with her husband.  Worked in Engineering geologist at [employer].  Non-smoker. - Maternal grandmother with bladder cancer.  Maternal great grandmother with breast cancer.    Plan: High-grade serous carcinoma of peritoneum: - She was admitted to the hospital from 11/04/2023 through 11/07/2023 with partial small bowel obstruction (vomiting and abdominal pain).  It resolved on conservative management. - She is currently eating soft foods (she can, mashed potatoes, were cooked vegetables, chicken noodle soup) and liquids.  She denies any vomiting since discharge.  No abdominal pain reported. - Restaging CT scan on 10/24/2023: Posttreatment changes in the pelvis with no convincing evidence for active malignancy. - CTAP (11/04/2023): Suspected small bowel obstruction with narrowing/transition of the distal ileum, possibly on the basis of adhesions.  Vague 11 mm peritoneal nodule beneath the right mid anterior abdominal wall equivocal. - Nicole Santos  currently on hold since hospitalization.  Last bevacizumab  was on 10/14/2023. - CA125 has been trending up since February 2025.  Latest level was 173 on 10/14/2023.  Level from today is pending. - Labs today: Normal LFTs and creatinine.  CBC grossly normal with hemoglobin improved to 11.1. - She reports that she sees Nicole Santos on 11/22/2023. - Will hold off on bevacizumab  today.  I have recommended switching therapy to chemotherapy.  She is still has platinum sensitive disease.  We could reuse carboplatin  and paclitaxel .  She  is not interested in that regimen as she is just getting back her hair.  Options include carboplatin /gemcitabine or carboplatin /Doxil.  Single agent Doxil is also an option.  I will also reach out to Nicole Santos to get her opinion.    2.  Peripheral neuropathy: - Continue Cymbalta  60 mg daily.  Neuropathy in the bottom of the feet left more than right is stable.  3.  Hypomagnesemia: - Continue magnesium  3 times daily.  4.  Hypokalemia: - Continue K-Dur 20 milliequivalents daily.  Potassium is normal.   5.  Subsegmental pulmonary embolism (Dx 03/06/2022): - When she was hospitalized, hemoglobin dropped down to 7.9, likely from hemodilution.  She also reported intermittent rectal bleeding.  Eliquis  was held at that time.  She is taking aspirin  daily.  She does not report any bleeding per rectum since she was discharged home. - She will reach out to Dr. Ambruster for colonoscopy.    No orders of the defined types were placed in this encounter.     Nicole Santos,acting as a Neurosurgeon for Nicole Boros, Santos.,have documented all relevant documentation on the behalf of Nicole Boros, Santos,as directed by  Nicole Boros, Santos while in the presence of Nicole Boros, Santos.  I, Nicole Santos, have reviewed the above documentation for accuracy and completeness, and I agree with the above.     Nicole Boros, Santos   6/11/20254:23 PM  CHIEF COMPLAINT:   Diagnosis: high-grade serous carcinoma of peritoneum    Cancer Staging  Primary peritoneal carcinomatosis [facility]) Staging form: Ovary, Fallopian Tube, and Primary Peritoneal Carcinoma, AJCC 8th Edition - Clinical stage from 11/17/2021: FIGO Stage IIIC (cT3c, cM0) - Unsigned    Prior Therapy: Carboplatin  and paclitaxel -- x 4 cycles 11/21/2021 - 01/23/2022, x5 cycles 06/05/22 - 08/29/22, x3 cycles 11/05/22 - 12/17/22   Current Therapy:  olaparib  and maintenance bevacizumab     HISTORY OF PRESENT ILLNESS:   Oncology  History  Malignant neoplasm of ovary (HCC)  11/15/2021 Initial Diagnosis   Malignant neoplasm of ovary (HCC)   01/07/2023 -  Chemotherapy   Patient is on Treatment Plan : OVARIAN Bevacizumab  q21d     Primary peritoneal carcinomatosis (HCC)  11/13/2021 Imaging   CT A/P: 1. Trace ascites throughout the abdomen and pelvis, with extensive peritoneal thickening, nodularity, and omental caking. Findings are consistent with widespread peritoneal and omental metastatic disease. 2. No evidence of primary malignancy in the chest, abdomen, or pelvis, with specific attention to the most likely etiologies of peritoneal metastatic disease including pancreatic, ovarian, and endometrial malignancy. Although there is no obvious mass, pelvic ultrasound may be helpful to more closely assess the female reproductive organs. 3. Small left pleural effusion and associated atelectasis or consolidation, nonspecific although worrisome for malignant effusion. No overt pleural thickening or nodularity. 4. Scattered areas of nonspecific infectious or inflammatory alveolar ground-glass airspace opacity throughout the lungs. 5. Cholelithiasis without evidence of acute cholecystitis. 6. Nonobstructive right nephrolithiasis. 7. Postoperative findings of same day laparoscopy including minimal pneumoperitoneum.   Aortic Atherosclerosis (ICD10-I70.0).   11/13/2021 Surgery   Diagnostic laparoscopy with biopsy Dr. Larrie Po Findings: Significant bloody ascites, studding of the omentum as well as the upper abdominal wall and diaphragm.  Nodularity noted along the small intestine.  Omental disease inhibited view of the pelvis.   11/17/2021 Initial Diagnosis   Primary peritoneal carcinomatosis (HCC)   11/21/2021 - 01/25/2022 Chemotherapy   Patient is on Treatment Plan : OVARIAN Carboplatin  (AUC 6) / Paclitaxel  (175) q21d x 6  cycles     11/21/2021 - 12/17/2022 Chemotherapy   Patient is on Treatment Plan : OVARIAN Carboplatin   (AUC 6) + Paclitaxel  (175)  + Mvasi  q21d X 6 Cycles     01/19/2022 Imaging   CT A/P: 1. Small amount of adherent thrombus or fibrin sheath around the catheter in the LEFT brachiocephalic vein. 2. Small low-attenuation area in the inter lobar fissure region associated with medial segment of LEFT hepatic lobe, new from previous imaging, may represent developing variable fat deposition though given history of peritoneal disease would suggest attention on subsequent imaging. 3. Diminished thickening of mesenteric reflections, decreased ascites and decreased omental infiltration since previous imaging. 4. Focal concentric narrowing of the rectosigmoid junction is nonspecific but suspicious for neoplasm potentially related to serosal involvement in the pelvis in this patient with known peritoneal disease from ovarian cancer. Underlying colonic neoplasm would be difficult to exclude as well. Colonic assessment with endoscopy or comparison with recent endoscopy performed is suggested. 5. Cholelithiasis without evidence of acute cholecystitis. 6. 4 mm RIGHT lower pole renal calculus.   Aortic Atherosclerosis (ICD10-I70.0).   03/06/2022 Imaging   CT C/A/P: Chest CT:   Scattered subsegmental pulmonary emboli in the right lung.   Abdominal CT:   1. Mild right hydroureteronephrosis from a 4 mm UVJ calculus. 2. Unchanged omental disease. 3. Cholelithiasis and other chronic findings described above.   05/24/2022 Imaging   Increased peritoneal carcinomatosis and mild ascites.   New moderate distal small bowel obstruction, with transition point in the central pelvis adjacent to the uterine fundus, which may be due to peritoneal carcinomatosis or adhesion.   Cholelithiasis. No radiographic evidence of cholecystitis.   Aortic Atherosclerosis (ICD10-I70.0).   08/21/2022 Imaging   CT A/P: 1. Improved peritoneal carcinomatosis and mild ascites. 2. Prominent/mildly enlarged loops of small  bowel throughout the abdomen with transition point in the pelvis, not significantly changed from prior and compatible with a least low-grade obstruction. 3. Cholelithiasis without findings of acute cholecystitis.   10/01/2022 Surgery   Diagnostic laparoscopy, robotic assisted laparoscopic lysis of adhesions, exploratory laparotomy, omentectomy   Findings: On EUA, small uterus within minimal mobility. On intra-abdominal entry, omentum adherent to the anterior abdominal wall at the level of the transverse colon. Some adhesions noted between the anterior liver surface and the diaphragm on the right. Small miliary disease noted on bilateral diaphragms, small bowel, small bowel mesentery, abdominal and pelvic peritoneum. No ascites. Complete retroperitonealization of the pelvic organs. Multiple loops of small bowel were densely adherent to the uterine fundus without obvious obstruction. Terminal ileum and cecum also adherent along the right pelvic sidewall. Sigmoid colon densely adherent to a loop of ileum and to the lateral aspect of the retroperitonealized pelvis on the left. After opening the retroperitoneum bilaterally and transecting the round ligaments, the bladder flap was able to be partially developed anteriorly. This revealed dense adhesions of the sigmoid to the lateral uterus. Given prior discussions with the patient, I did not feel that proceeding with further attempt at lysis of adhesions (some mobilization of two loops of ileum adherent to the uterine fundus was performed) would be possible without significant risk of injury to multiple loops of small bowel and to the sigmoid colon. 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 On laparotomy, 8 x 4 cm omental cake noted. No disease visualized or palpated within the lesser sac. There was no palpable disease along the liver edges or spleen. Stomach was normal in appearance. Some  miliary disease palpated  along the abdominal peritoneum. Numerous miliary disease noted along the small bowel and small bowel mesentery. Numerous prominent lymph nodes (1cm or less) within the stalk of the small bowel mesentery.  R1 resection based on miliary disease (in the setting of incomplete pelvic evaluation).   10/01/2022 Pathology Results   A. Pelvic sidewall, right, excision - High-grade serous carcinoma   B. Omentum, resection - High-grade serous carcinoma - Treatment effect present - See comment   C. Small bowel, implant, excision - Infarcted epiploic appendage - Negative for carcinoma  Immunohistochemical stains were performed on block B1. The tumor cells are positive for WT1, compatible with tubo-ovarian or peritoneal origin. p53 demonstrates faint positivity in rare cells, favored to be most consistent with a null pattern.    11/05/2022 Genetic Testing   Negative genetic testing on the [facility] Multi-cancer + RNA panel.  The report date is November 05, 2022.  The Multi-Cancer + RNA Panel offered by [facility] includes sequencing and/or deletion/duplication analysis of the following 70 genes:  AIP*, ALK, APC*, ATM*, AXIN2*, BAP1*, BARD1*, BLM*, BMPR1A*, BRCA1*, BRCA2*, BRIP1*, CDC73*, CDH1*, CDK4, CDKN1B*, CDKN2A, CHEK2*, CTNNA1*, DICER1*, EPCAM (del/dup only), EGFR, FH*, FLCN*, GREM1 (promoter dup only), HOXB13, KIT, LZTR1, MAX*, MBD4, MEN1*, MET, MITF, MLH1*, MSH2*, MSH3*, MSH6*, MUTYH*, NF1*, NF2*, NTHL1*, PALB2*, PDGFRA, PMS2*, POLD1*, POLE*, POT1*, PRKAR1A*, PTCH1*, PTEN*, RAD51C*, RAD51D*, RB1*, RET, SDHA* (sequencing only), SDHAF2*, SDHB*, SDHC*, SDHD*, SMAD4*, SMARCA4*, SMARCB1*, SMARCE1*, STK11*, SUFU*, TMEM127*, TP53*, TSC1*, TSC2*, VHL*. RNA analysis is performed for * genes.    01/07/2023 -  Chemotherapy   Patient is on Treatment Plan : OVARIAN Bevacizumab  q21d        INTERVAL HISTORY:   Nicole Santos is a 53 y.o. female presenting to clinic today for follow up of high-grade serous carcinoma of peritoneum. She  was last seen by me on 10/14/23.  Since her last visit, she underwent CT AP on 10/24/23 that found: Posttreatment changes in the pelvis with no convincing evidence for active malignancy within the abdomen or pelvis.  Nicole Santos was admitted to the hospital from 11/04/23 to 11/07/23 for a SBO. Her NG tube was removed. She was also noted to have rectal bleeding which caused enoxaparin  and Eliquis  to be held.   Today, she states that she is doing well overall. Her appetite level is at 70%. Her energy level is at 90%.  PAST MEDICAL HISTORY:   Past Medical History: Past Medical History:  Diagnosis Date   Acute pulmonary embolism (HCC) 03/06/2022   Acute respiratory disease due to COVID-19 virus 02/02/2019   Anxiety    Cancer (HCC)    Depression    Dyspnea    Dysrhythmia    Family history of bladder cancer    Family history of breast cancer    History of diabetes mellitus    Resolved in Sept 2023 with weight loss.   Hypertension    Ovarian cancer (HCC) 11/14/2021   Peritoneal carcinomatosis (HCC)    Pneumonia    Sleep apnea    doesn't use CPAP   Tachycardia     Surgical History: Past Surgical History:  Procedure Laterality Date   KNEE SURGERY Right    Arthroscopic   LAPAROSCOPIC ABDOMINAL EXPLORATION N/A 11/13/2021   Procedure: Exploratory laparoscopy;  Surgeon: Alanda Allegra, Santos;  Location: [facility];  Service: General;  Laterality: N/A;   PORTACATH PLACEMENT Left 11/15/2021   Procedure: INSERTION PORT-A-CATH;  Surgeon: Alanda Allegra, Santos;  Location: [facility];  Service: General;  Laterality:  Left;   TONSILLECTOMY AND ADENOIDECTOMY      Social History: Social History   Socioeconomic History   Marital status: Married    Spouse name: Not on file   Number of children: Not on file   Years of education: Not on file   Highest education level: Not on file  Occupational History   Occupation: Unemployed  Tobacco Use   Smoking status: Never   Smokeless tobacco: Never  Vaping Use   Vaping  status: Never Used  Substance and Sexual Activity   Alcohol use: No   Drug use: No   Sexual activity: Not Currently  Other Topics Concern   Not on file  Social History Narrative   Not on file   Social Drivers of Health   Financial Resource Strain: Not on file  Food Insecurity: No Food Insecurity (11/04/2023)   Hunger Vital Sign    Worried About Running Out of Food in the Last Year: Never true    Ran Out of Food in the Last Year: Never true  Transportation Needs: No Transportation Needs (11/04/2023)   PRAPARE - Administrator, Civil Service (Medical): No    Lack of Transportation (Non-Medical): No  Physical Activity: Not on file  Stress: Not on file  Social Connections: Not on file  Intimate Partner Violence: Not At Risk (11/04/2023)   Humiliation, Afraid, Rape, and Kick questionnaire    Fear of Current or Ex-Partner: No    Emotionally Abused: No    Physically Abused: No    Sexually Abused: No    Family History: Family History  Problem Relation Age of Onset   Hypertension Mother    Heart attack Paternal Uncle    Bladder Cancer Maternal Grandmother 32       non-smoker   Dementia Maternal Grandfather    Breast cancer Maternal Great-grandmother        MGF's mother   Colon cancer Neg Hx    Ovarian cancer Neg Hx    Endometrial cancer Neg Hx    Pancreatic cancer Neg Hx    Prostate cancer Neg Hx     Current Medications:  Current Outpatient Medications:    aspirin  EC 81 MG tablet, Take 1 tablet (81 mg total) by mouth daily. Swallow whole., Disp: , Rfl:    atorvastatin  (LIPITOR) 20 MG tablet, Take 1 tablet (20 mg total) by mouth at bedtime., Disp: 90 tablet, Rfl: 3   diphenoxylate -atropine  (LOMOTIL ) 2.5-0.025 MG tablet, Take 2 tablets by mouth after the first watery bowel movement. May then take 1 pill by mouth after each watery bowel movement. Do not exceed 8 pills in a 24 hour period. (Patient taking differently: Take 2 tablets by mouth See admin instructions. Take  2 tablets by mouth after the first watery bowel movement. May then take 1 pill by mouth after each watery bowel movement. Do not exceed 8 pills in a 24 hour period.), Disp: 60 tablet, Rfl: 3   DULoxetine  (CYMBALTA ) 30 MG capsule, Take 2 capsules (60 mg total) by mouth daily., Disp: , Rfl:    lidocaine -prilocaine  (EMLA ) cream, Apply a quarter sized amount to port a cath site and cover with plastic wrap 1 hour prior to infusion appointments (Patient taking differently: Apply 1 Application topically See admin instructions. Apply a quarter sized amount to port a cath site and cover with plastic wrap 1 hour prior to infusion appointments), Disp: 30 g, Rfl: 3   magnesium  oxide (MAG-OX) 400 (240 Mg) MG tablet, TAKE 1  TABLET BY MOUTH TWICE A DAY (Patient taking differently: Take 400 mg by mouth 2 (two) times daily.), Disp: 180 tablet, Rfl: 1   Multiple Vitamin (MULTIVITAMIN) capsule, Take 1 capsule by mouth daily., Disp: , Rfl:    ondansetron  (ZOFRAN ) 4 MG tablet, Take 1 tablet (4 mg total) by mouth every 6 (six) hours as needed for nausea., Disp: 20 tablet, Rfl: 0   polyethylene glycol (MIRALAX  / GLYCOLAX ) 17 g packet, Take 17 g by mouth daily., Disp: 30 each, Rfl: 3   Potassium 99 MG TABS, Take 99 mg by mouth at bedtime., Disp: , Rfl:    Nicole Santos  150 MG tablet, TAKE 2 TABLETS TWICE A DAY. MAY TAKE WITH FOOD TO DECEASE NAUSEA AND VOMITING. (SWALLOW WHOLE) (Patient not taking: Reported on 11/13/2023), Disp: 360 tablet, Rfl: 1 No current facility-administered medications for this visit.  Facility-Administered Medications Ordered in Other Visits:    heparin  lock flush 100 unit/mL, 500 Units, Intravenous, Once, Nicole Boros, Santos   sodium chloride  flush (NS) 0.9 % injection 10 mL, 10 mL, Intravenous, PRN, Athan Casalino, Santos   sodium chloride  flush (NS) 0.9 % injection 10 mL, 10 mL, Intracatheter, PRN, Abrham Maslowski, Santos, 10 mL at 10/14/23 1610   Allergies: Allergies  Allergen Reactions    Augmentin  [Amoxicillin -Pot Clavulanate] Diarrhea and Nausea And Vomiting   Doxycycline     Abdominal cramps.   Topamax  [Topiramate ] Nausea And Vomiting    REVIEW OF SYSTEMS:   Review of Systems  Constitutional:  Negative for chills, fatigue and fever.  HENT:   Negative for lump/mass, mouth sores, nosebleeds, sore throat and trouble swallowing.   Eyes:  Negative for eye problems.  Respiratory:  Negative for cough and shortness of breath.   Cardiovascular:  Negative for chest pain, leg swelling and palpitations.  Gastrointestinal:  Negative for abdominal pain, constipation, diarrhea, nausea and vomiting.  Genitourinary:  Negative for bladder incontinence, difficulty urinating, dysuria, frequency, hematuria and nocturia.   Musculoskeletal:  Negative for arthralgias, back pain, flank pain, myalgias and neck pain.  Skin:  Negative for itching and rash.  Neurological:  Positive for numbness. Negative for dizziness and headaches.  Hematological:  Does not bruise/bleed easily.  Psychiatric/Behavioral:  Positive for sleep disturbance. Negative for depression and suicidal ideas. The patient is not nervous/anxious.   All other systems reviewed and are negative.    VITALS:   Blood pressure (!) 122/91, pulse (!) 102, temperature 99.6 F (37.6 C), temperature source Tympanic, resp. rate 20, weight 191 lb 2.2 oz (86.7 kg), SpO2 100%.  Wt Readings from Last 3 Encounters:  11/13/23 191 lb 2.2 oz (86.7 kg)  11/04/23 196 lb 10.4 oz (89.2 kg)  10/14/23 205 lb 7.5 oz (93.2 kg)    Body mass index is 34.96 kg/m.  Performance status (ECOG): 1 - Symptomatic but completely ambulatory  PHYSICAL EXAM:   Physical Exam Vitals and nursing note reviewed. Exam conducted with a chaperone present.  Constitutional:      Appearance: Normal appearance.  Cardiovascular:     Rate and Rhythm: Normal rate and regular rhythm.     Pulses: Normal pulses.     Heart sounds: Normal heart sounds.  Pulmonary:      Effort: Pulmonary effort is normal.     Breath sounds: Normal breath sounds.  Abdominal:     Palpations: Abdomen is soft. There is no hepatomegaly, splenomegaly or mass.     Tenderness: There is no abdominal tenderness.  Musculoskeletal:     Right lower leg:  No edema.     Left lower leg: No edema.  Lymphadenopathy:     Cervical: No cervical adenopathy.     Right cervical: No superficial, deep or posterior cervical adenopathy.    Left cervical: No superficial, deep or posterior cervical adenopathy.     Upper Body:     Right upper body: No supraclavicular or axillary adenopathy.     Left upper body: No supraclavicular or axillary adenopathy.  Neurological:     General: No focal deficit present.     Mental Status: She is alert and oriented to person, place, and time.  Psychiatric:        Mood and Affect: Mood normal.        Behavior: Behavior normal.     LABS:   CBC     Component Value Date/Time   WBC 6.4 11/13/2023 0905   RBC 3.14 (L) 11/13/2023 0905   HGB 11.1 (L) 11/13/2023 0905   HGB 9.0 (L) 02/08/2022 1459   HCT 34.4 (L) 11/13/2023 0905   HCT 27.0 (L) 02/08/2022 1459   PLT 176 11/13/2023 0905   PLT 79 (LL) 02/08/2022 1459   MCV 109.6 (H) 11/13/2023 0905   MCV 88 02/08/2022 1459   MCH 35.4 (H) 11/13/2023 0905   MCHC 32.3 11/13/2023 0905   RDW 23.2 (H) 11/13/2023 0905   RDW 23.4 (H) 02/08/2022 1459   LYMPHSABS 1.0 11/13/2023 0905   LYMPHSABS 1.2 02/08/2022 1459   MONOABS 0.9 11/13/2023 0905   EOSABS 0.1 11/13/2023 0905   EOSABS 0.1 02/08/2022 1459   BASOSABS 0.0 11/13/2023 0905   BASOSABS 0.0 02/08/2022 1459    CMP      Component Value Date/Time   NA 137 11/13/2023 0905   NA 136 03/01/2022 1242   K 3.9 11/13/2023 0905   CL 102 11/13/2023 0905   CO2 24 11/13/2023 0905   GLUCOSE 133 (H) 11/13/2023 0905   BUN 12 11/13/2023 0905   BUN 5 (L) 03/01/2022 1242   CREATININE 0.81 11/13/2023 0905   CALCIUM  9.1 11/13/2023 0905   PROT 6.7 11/13/2023 0905   PROT  6.7 03/01/2022 1242   ALBUMIN 3.2 (L) 11/13/2023 0905   ALBUMIN 3.6 (L) 03/01/2022 1242   AST 17 11/13/2023 0905   ALT 13 11/13/2023 0905   ALKPHOS 50 11/13/2023 0905   BILITOT 1.3 (H) 11/13/2023 0905   BILITOT 1.0 03/01/2022 1242   GFRNONAA >60 11/13/2023 0905   GFRAA 103 05/31/2020 1033     Lab Results  Component Value Date   CEA1 1.1 11/13/2021   /  CEA  Date Value Ref Range Status  11/13/2021 1.1 0.0 - 4.7 ng/mL Final    Comment:    (NOTE)                             Nonsmokers          <3.9                             Smokers             <5.6 [employer] Electrochemiluminescence Immunoassay (ECLIA) Values obtained with different assay methods or kits cannot be used interchangeably.  Results cannot be interpreted as absolute evidence of the presence or absence of malignant disease. Performed At: [facility] [facility] [address] [place], [name] [id number] Pearlean Botts Santos XL:[id number]    No results found for:  PSA1 Lab Results  Component Value Date   CAN199 6 11/13/2021   Lab Results  Component Value Date   CAN125 173.0 (H) 10/14/2023    No results found for: TOTALPROTELP, ALBUMINELP, A1GS, A2GS, BETS, BETA2SER, GAMS, MSPIKE, SPEI Lab Results  Component Value Date   TIBC 189 (L) 11/06/2023   FERRITIN 271 11/06/2023   FERRITIN 356 (H) 02/07/2019   FERRITIN 311 (H) 02/06/2019   IRONPCTSAT 29 11/06/2023   Lab Results  Component Value Date   LDH 259 (H) 02/02/2019     STUDIES:   DG Abd Portable 1V-Small Bowel Obstruction Protocol-initial, 8 hr delay Result Date: 11/05/2023 CLINICAL DATA:  Small-bowel obstruction.  8 hour delayed image EXAM: PORTABLE ABDOMEN - 1 VIEW COMPARISON:  7:24 a.m. FINDINGS: Nasogastric tube tip overlies the gastric fundus. Administered oral contrast is now seen throughout a nondistended colon. Multiple gas-filled dilated loops of small bowel persist, however, the combination suggests an underlying  distal partial small bowel obstruction or resolving complete small bowel obstruction. Cholelithiasis. Pulmonary hypoinflation. IMPRESSION: 1. Administered oral contrast is now seen throughout a nondistended colon. 2. Persistent dilated loops of small bowel suggesting an underlying distal partial small bowel obstruction or resolving complete small bowel obstruction. Electronically Signed   By: Worthy Heads M.D.   On: 11/05/2023 21:52   DG Abd 1 View Result Date: 11/05/2023 CLINICAL DATA:  Small bowel obstruction. EXAM: ABDOMEN - 1 VIEW COMPARISON:  11/04/2023 and CT abdomen pelvis 11/04/2023 FINDINGS: Nasogastric tube is in the stomach body region. Evidence for calcified gallstones. There continues to be dilated loops of bowel in the abdomen and no significant gas in the rectum. Limited evaluation for free air on this supine examination. IMPRESSION: 1. Persistent dilated loops of bowel in the abdomen. Findings remain compatible with small bowel obstruction. Minimal change from the CT topogram on 11/04/2023. 2. Nasogastric tube is in the stomach body region. 3. Cholelithiasis. Electronically Signed   By: Elene Griffes M.D.   On: 11/05/2023 11:36   DG Abd Portable 1 View Result Date: 11/04/2023 CLINICAL DATA:  NG tube placement EXAM: PORTABLE ABDOMEN - 1 VIEW COMPARISON:  11/03/2021 FINDINGS: NG tube tip in the stomach. Mildly dilated small bowel loops noted. Lung bases clear. IMPRESSION: NG tube tip in the stomach. Electronically Signed   By: Janeece Mechanic M.D.   On: 11/04/2023 12:55   CT ABDOMEN PELVIS W CONTRAST Result Date: 11/04/2023 EXAM: CT ABDOMEN AND PELVIS WITH CONTRAST 11/04/2023 03:20:52 AM TECHNIQUE: CT of the abdomen and pelvis was performed with the administration of iohexol  (OMNIPAQUE ) 300 MG/ML solution. Multiplanar reformatted images are provided for review. Automated exposure control, iterative reconstruction, and/or weight based adjustment of the mA/kV was utilized to reduce the radiation  dose to as low as reasonably achievable. COMPARISON: 10/24/2023 CLINICAL HISTORY: Bowel obstruction suspected; history of peritoneal carcinomatosis, hernia and gall stones. Vomiting and diarrhea and feeling of fullness in abdomen at all times. Patient is a cancer patient and has been seen for this in the past by her cancer doctor but feels as if something else is going on. FINDINGS: LOWER CHEST: No acute abnormality. LIVER: The liver is unremarkable. GALLBLADDER AND BILE DUCTS: Cholelithiasis, without associated inflammatory changes. SPLEEN: No acute abnormality. PANCREAS: No acute abnormality. ADRENAL GLANDS: No acute abnormality. KIDNEYS, URETERS AND BLADDER: 2.6 cm simple left upper pole renal cyst (image 36), benign (Bosniak 1). No follow-up is recommended. No stones in the kidneys or ureters. No hydronephrosis. No perinephric or periureteral stranding. Urinary bladder  is unremarkable. GI AND BOWEL: Small right paramidline periumbilical ventral hernia containing a knuckle of mildly dilated small bowel with interval fluid (image 61). Dilated small bowel mildly progressive with narrowing/transition of the distal ileum (image 67), possibly on the basis of adhesions. No appendicitis. PERITONEUM AND RETROPERITONEUM: Vague 11 mm peritoneal nodule beneath the right mid anterior abdominal wall (image 41), equivocal, although warranting attention on follow-up to exclude a peritoneal implant. No omental caking. Very mild mesenteric stranding/fluid in the right mid abdomen (image 50). No free air. VASCULATURE: Aorta is normal in caliber. LYMPH NODES: No lymphadenopathy. REPRODUCTIVE ORGANS: No acute abnormality. BONES AND SOFT TISSUES: Mild degenerative changes of the lower thoracic spine. No acute osseous abnormality. No focal soft tissue abnormality. IMPRESSION: 1. Suspected small bowel obstruction with narrowing/transition of the distal ileum, possibly on the basis of adhesions. 2. Small right paramidline  periumbilical ventral hernia a loop of small bowel, now with interval fluid. Correlate for reducibility. 3. Vague 11 mm peritoneal nodule beneath the right mid anterior abdominal wall, equivocal. Attention on follow-up is suggested. 4. Cholelithiasis without associated inflammatory changes. Electronically signed by: Zadie Herter Santos 11/04/2023 03:33 AM EDT RP Workstation: [id number]   CT ABDOMEN PELVIS W CONTRAST Result Date: 10/24/2023 EXAMINATION: CT ABDOMEN PELVIS W CONTRAST CLINICAL INDICATION: Female, 53 years old. Ovarian cancer, monitor TECHNIQUE: Axial CT of the abdomen and pelvis with 100 cc Omnipaque  300 intravenous contrast. Multiplanar reformations provided. Unless otherwise specified, incidental thyroid , adrenal, renal lesions do not require dedicated imaging follow up. Additionally, any mentioned pulmonary nodules do not require dedicated imaging follow-up based on the Fleischner guidelines unless otherwise specified. Coronary calcifications are not identified unless otherwise specified. COMPARISON: 08/20/2023 FINDINGS: Lumbar lung bases are clear. The heart is normal in size. The liver appears normal. There is cholelithiasis. The spleen is normal. The pancreas is normal. The adrenals are normal. The right kidney is normal. Left renal cyst is seen. The abdominal aorta is normal in caliber. Scattered atherosclerotic changes are present in the left common iliac vein stent noted. There are findings of cystitis which may be posttreatment related. There is a fiducial marker suspected at the level of the cervix with suspected posttreatment changes of the uterus which appears atrophic. There is rectal wall thickening concerning for proctitis likely posttreatment related. There is a small periumbilical hernia containing a small bowel loop. Large and small bowel loops are otherwise within normal limits. There is no free fluid or lymphadenopathy. No concerning peritoneal nodularity is appreciated. There is  diffuse osseous demineralization. There are degenerative changes of the spine and bony pelvis. IMPRESSION: Posttreatment changes in the pelvis with no convincing evidence for active malignancy within the abdomen or pelvis. DOSE REDUCTION: This exam was performed according to our departmental dose-optimization program which includes automated exposure control, adjustment of the mA and/or kV according to patient size and/or use of iterative reconstruction technique. Electronically signed by: [place] Engel Santos 10/24/2023 05:44 PM EDT RP Workstation: [id number]

## 2023-11-13 ENCOUNTER — Inpatient Hospital Stay

## 2023-11-13 ENCOUNTER — Inpatient Hospital Stay (HOSPITAL_BASED_OUTPATIENT_CLINIC_OR_DEPARTMENT_OTHER): Admitting: Hematology

## 2023-11-13 ENCOUNTER — Inpatient Hospital Stay: Attending: Gynecologic Oncology

## 2023-11-13 VITALS — BP 122/91 | HR 102 | Temp 99.6°F | Resp 20 | Wt 191.1 lb

## 2023-11-13 DIAGNOSIS — Z7982 Long term (current) use of aspirin: Secondary | ICD-10-CM | POA: Insufficient documentation

## 2023-11-13 DIAGNOSIS — Z9221 Personal history of antineoplastic chemotherapy: Secondary | ICD-10-CM | POA: Insufficient documentation

## 2023-11-13 DIAGNOSIS — C482 Malignant neoplasm of peritoneum, unspecified: Secondary | ICD-10-CM | POA: Diagnosis present

## 2023-11-13 DIAGNOSIS — Z79899 Other long term (current) drug therapy: Secondary | ICD-10-CM | POA: Insufficient documentation

## 2023-11-13 DIAGNOSIS — E876 Hypokalemia: Secondary | ICD-10-CM | POA: Diagnosis not present

## 2023-11-13 DIAGNOSIS — R070 Pain in throat: Secondary | ICD-10-CM | POA: Insufficient documentation

## 2023-11-13 DIAGNOSIS — G620 Drug-induced polyneuropathy: Secondary | ICD-10-CM | POA: Diagnosis not present

## 2023-11-13 DIAGNOSIS — R971 Elevated cancer antigen 125 [CA 125]: Secondary | ICD-10-CM | POA: Diagnosis not present

## 2023-11-13 DIAGNOSIS — Z86711 Personal history of pulmonary embolism: Secondary | ICD-10-CM | POA: Diagnosis not present

## 2023-11-13 DIAGNOSIS — R000 Tachycardia, unspecified: Secondary | ICD-10-CM | POA: Diagnosis not present

## 2023-11-13 DIAGNOSIS — C569 Malignant neoplasm of unspecified ovary: Secondary | ICD-10-CM

## 2023-11-13 LAB — COMPREHENSIVE METABOLIC PANEL WITH GFR
ALT: 13 U/L (ref 0–44)
AST: 17 U/L (ref 15–41)
Albumin: 3.2 g/dL — ABNORMAL LOW (ref 3.5–5.0)
Alkaline Phosphatase: 50 U/L (ref 38–126)
Anion gap: 11 (ref 5–15)
BUN: 12 mg/dL (ref 6–20)
CO2: 24 mmol/L (ref 22–32)
Calcium: 9.1 mg/dL (ref 8.9–10.3)
Chloride: 102 mmol/L (ref 98–111)
Creatinine, Ser: 0.81 mg/dL (ref 0.44–1.00)
GFR, Estimated: >60 mL/min (ref >60)
Glucose, Bld: 133 mg/dL — ABNORMAL HIGH (ref 70–99)
Potassium: 3.9 mmol/L (ref 3.5–5.1)
Sodium: 137 mmol/L (ref 135–145)
Total Bilirubin: 1.3 mg/dL — ABNORMAL HIGH (ref 0.0–1.2)
Total Protein: 6.7 g/dL (ref 6.5–8.1)

## 2023-11-13 LAB — CBC WITH DIFFERENTIAL/PLATELET
Abs Immature Granulocytes: 0.02 10*3/uL (ref 0.00–0.07)
Basophils Absolute: 0 10*3/uL (ref 0.0–0.1)
Basophils Relative: 1 %
Eosinophils Absolute: 0.1 10*3/uL (ref 0.0–0.5)
Eosinophils Relative: 2 %
HCT: 34.4 % — ABNORMAL LOW (ref 36.0–46.0)
Hemoglobin: 11.1 g/dL — ABNORMAL LOW (ref 12.0–15.0)
Immature Granulocytes: 0 %
Lymphocytes Relative: 15 %
Lymphs Abs: 1 10*3/uL (ref 0.7–4.0)
MCH: 35.4 pg — ABNORMAL HIGH (ref 26.0–34.0)
MCHC: 32.3 g/dL (ref 30.0–36.0)
MCV: 109.6 fL — ABNORMAL HIGH (ref 80.0–100.0)
Monocytes Absolute: 0.9 10*3/uL (ref 0.1–1.0)
Monocytes Relative: 13 %
Neutro Abs: 4.4 10*3/uL (ref 1.7–7.7)
Neutrophils Relative %: 69 %
Platelets: 176 10*3/uL (ref 150–400)
RBC: 3.14 MIL/uL — ABNORMAL LOW (ref 3.87–5.11)
RDW: 23.2 % — ABNORMAL HIGH (ref 11.5–15.5)
WBC: 6.4 10*3/uL (ref 4.0–10.5)
nRBC: 0 % (ref 0.0–0.2)

## 2023-11-13 LAB — MAGNESIUM: Magnesium: 1.8 mg/dL (ref 1.7–2.4)

## 2023-11-13 NOTE — Progress Notes (Signed)
 No treatment today due to progression per Dr.Katragadda.

## 2023-11-13 NOTE — Patient Instructions (Signed)

## 2023-11-14 ENCOUNTER — Encounter: Payer: Self-pay | Admitting: *Deleted

## 2023-11-14 LAB — CA 125: Cancer Antigen (CA) 125: 219 U/mL — ABNORMAL HIGH (ref 0.0–38.1)

## 2023-11-19 ENCOUNTER — Encounter: Payer: Self-pay | Admitting: Nurse Practitioner

## 2023-11-19 ENCOUNTER — Ambulatory Visit (INDEPENDENT_AMBULATORY_CARE_PROVIDER_SITE_OTHER): Admitting: Nurse Practitioner

## 2023-11-19 VITALS — BP 105/74 | HR 126 | Temp 97.8°F | Ht 62.0 in | Wt 189.0 lb

## 2023-11-19 DIAGNOSIS — M542 Cervicalgia: Secondary | ICD-10-CM | POA: Diagnosis not present

## 2023-11-19 MED ORDER — PREDNISONE 20 MG PO TABS: 40.0000 mg | ORAL_TABLET | Freq: Every day | ORAL | 0 refills | Status: AC

## 2023-11-19 NOTE — Progress Notes (Signed)
 Subjective:    Patient ID: Nicole Santos, female    DOB: 1970-12-07, 53 y.o.   MRN: [id number]   Chief Complaint: Ear Pain (Left ear. Pain and heat on outside of face on left side)   HPI  Patient comes in today c/o left neck pain radiating up to  left ear. Was accompanied by a burning sensation. Had a fever for 3 days with slight pain when swallowing. Now pain is mainly in left jaw and ear. She Denies any chest pian or sob. No sweating. Describes pain as achiness rates 7/10. Worsens with palpation. Was in hospital last week with NG tube in place. They checked her heart and was all normal.  Patient Active Problem List   Diagnosis Date Noted   Rectal bleeding 11/12/2023   ABLA (acute blood loss anemia) 11/12/2023   High grade serous carcinoma (HCC) 11/05/2023   Small bowel obstruction (HCC) 11/04/2023   Abdominal pain 11/04/2023   Ventral hernia 11/04/2023   Macrocytic anemia 11/04/2023   Hypomagnesemia 11/04/2023   Prediabetes 11/04/2023   Hyperglycemia 11/04/2023   Peripheral neuropathy 11/04/2023   Genetic testing 11/06/2022   Family history of breast cancer 10/31/2022   Family history of bladder cancer 10/31/2022   Pulmonary embolism (HCC) 03/06/2022   Hyponatremia 03/06/2022   Right Hydroureteronephrosis 03/06/2022   Calculus of ureterovesical junction (UVJ) Right  03/06/2022   Vasovagal syncope 02/17/2022   Primary peritoneal carcinomatosis (HCC) 11/17/2021   Malignant neoplasm of ovary (HCC)    Malignant ascites 11/13/2021   Obesity (BMI 30.0-34.9) 04/02/2021   Back pain with sciatica 05/31/2020   Mixed hyperlipidemia 10/03/2016   Dyspnea on exertion 08/01/2016   Sciatica of left side 07/04/2016   Essential hypertension 07/04/2016       Review of Systems  Constitutional:  Negative for chills, fatigue and fever.  HENT:  Positive for sore throat (better). Negative for congestion.   Respiratory:  Negative for cough, chest tightness and shortness of breath.   All  other systems reviewed and are negative.      Objective:   Physical Exam Constitutional:      Appearance: Normal appearance. She is obese.  HENT:     Right Ear: Tympanic membrane normal.     Left Ear: Tympanic membrane normal.     Mouth/Throat:     Mouth: Mucous membranes are moist.  Neck:     Comments: Tenderness left side upon palpation  Cardiovascular:     Rate and Rhythm: Normal rate and regular rhythm.     Heart sounds: Normal heart sounds.  Pulmonary:     Effort: Pulmonary effort is normal.     Breath sounds: Normal breath sounds.   Musculoskeletal:        General: Normal range of motion.     Cervical back: Normal range of motion and neck supple.     Comments: FROM of cervical spine with pain on left die with rotation and extension of neck.    Neurological:     General: No focal deficit present.     Mental Status: She is alert and oriented to person, place, and time.   Psychiatric:        Mood and Affect: Mood normal.        Behavior: Behavior normal.     BP 105/74   Pulse (!) 126   Temp 97.8 F (36.6 C) (Temporal)   Ht 5' 2 (1.575 m)   Wt 189 lb (85.7 kg)   SpO2 96%  BMI 34.57 kg/m        Assessment & Plan:   Nicole Santos in today with chief complaint of Ear Pain (Left ear. Pain and heat on outside of face on left side)   1. Neck pain (Primary) Ice Rest If does not improve- NTBS - predniSONE  (DELTASONE ) 20 MG tablet; Take 2 tablets (40 mg total) by mouth daily with breakfast for 5 days. 2 po daily for 5 days  Dispense: 10 tablet; Refill: 0    The above assessment and management plan was discussed with the patient. The patient verbalized understanding of and has agreed to the management plan. Patient is aware to call the clinic if symptoms persist or worsen. Patient is aware when to return to the clinic for a follow-up visit. Patient educated on when it is appropriate to go to the emergency department.   Mary-Margaret Gaylyn Keas, FNP

## 2023-11-22 ENCOUNTER — Inpatient Hospital Stay: Payer: Commercial Managed Care - PPO | Admitting: Gynecologic Oncology

## 2023-11-22 ENCOUNTER — Encounter: Payer: Self-pay | Admitting: Gynecologic Oncology

## 2023-11-22 VITALS — BP 121/74 | HR 123 | Temp 99.8°F | Resp 18 | Ht 62.0 in | Wt 189.4 lb

## 2023-11-22 DIAGNOSIS — C482 Malignant neoplasm of peritoneum, unspecified: Secondary | ICD-10-CM

## 2023-11-22 DIAGNOSIS — C579 Malignant neoplasm of female genital organ, unspecified: Secondary | ICD-10-CM

## 2023-11-22 DIAGNOSIS — R971 Elevated cancer antigen 125 [CA 125]: Secondary | ICD-10-CM

## 2023-11-22 DIAGNOSIS — R1310 Dysphagia, unspecified: Secondary | ICD-10-CM

## 2023-11-22 DIAGNOSIS — K56699 Other intestinal obstruction unspecified as to partial versus complete obstruction: Secondary | ICD-10-CM

## 2023-11-22 DIAGNOSIS — M542 Cervicalgia: Secondary | ICD-10-CM

## 2023-11-22 NOTE — Progress Notes (Signed)
 Gynecologic Oncology Return Clinic Visit  11/22/23  Reason for Visit: follow-up  Treatment History: Oncology History  Malignant neoplasm of ovary (HCC)  11/15/2021 Initial Diagnosis   Malignant neoplasm of ovary (HCC)   01/07/2023 -  Chemotherapy   Patient is on Treatment Plan : OVARIAN Bevacizumab  q21d     05/24/2023 Imaging   CT A/P 1. Similar slightly heterogeneous appearance of the uterus with tethered appearance of the terminal ileum and sigmoid colon to the no evidence of new or progressive disease in the abdomen or pelvis. 2. Cholelithiasis without findings of acute cholecystitis. 3.  Aortic Atherosclerosis (ICD10-I70.0).   08/20/2023 Imaging   CT A/P: 1. No metastatic disease identified within the abdomen or pelvis. 2. Redemonstration of heterogeneous appearance of the uterus and adhesion between the right fundal region of the uterus and terminal ileum without associated bowel obstruction. Findings favor posttreatment changes. 3. Stable 4. Multiple other nonacute observations, as described above.   10/24/2023 Imaging   CT A/P: Posttreatment changes in the pelvis with no convincing evidence for active malignancy within the abdomen or pelvis.     11/04/2023 Imaging   CT A/P: 1. Suspected small bowel obstruction with narrowing/transition of the distal ileum, possibly on the basis of adhesions. 2. Small right paramidline periumbilical ventral hernia a loop of small bowel, now with interval fluid. Correlate for reducibility. 3. Vague 11 mm peritoneal nodule beneath the right mid anterior abdominal wall, equivocal. Attention on follow-up is suggested. 4. Cholelithiasis without associated inflammatory changes.   Primary peritoneal carcinomatosis (HCC)  11/13/2021 Imaging   CT A/P: 1. Trace ascites throughout the abdomen and pelvis, with extensive peritoneal thickening, nodularity, and omental caking. Findings are consistent with widespread peritoneal and omental  metastatic disease. 2. No evidence of primary malignancy in the chest, abdomen, or pelvis, with specific attention to the most likely etiologies of peritoneal metastatic disease including pancreatic, ovarian, and endometrial malignancy. Although there is no obvious mass, pelvic ultrasound may be helpful to more closely assess the female reproductive organs. 3. Small left pleural effusion and associated atelectasis or consolidation, nonspecific although worrisome for malignant effusion. No overt pleural thickening or nodularity. 4. Scattered areas of nonspecific infectious or inflammatory alveolar ground-glass airspace opacity throughout the lungs. 5. Cholelithiasis without evidence of acute cholecystitis. 6. Nonobstructive right nephrolithiasis. 7. Postoperative findings of same day laparoscopy including minimal pneumoperitoneum.   Aortic Atherosclerosis (ICD10-I70.0).   11/13/2021 Surgery   Diagnostic laparoscopy with biopsy Dr. Larrie Po Findings: Significant bloody ascites, studding of the omentum as well as the upper abdominal wall and diaphragm.  Nodularity noted along the small intestine.  Omental disease inhibited view of the pelvis.   11/17/2021 Initial Diagnosis   Primary peritoneal carcinomatosis (HCC)   11/21/2021 - 01/25/2022 Chemotherapy   Patient is on Treatment Plan : OVARIAN Carboplatin  (AUC 6) / Paclitaxel  (175) q21d x 6 cycles     11/21/2021 - 12/17/2022 Chemotherapy   Patient is on Treatment Plan : OVARIAN Carboplatin  (AUC 6) + Paclitaxel  (175)  + Mvasi  q21d X 6 Cycles     01/19/2022 Imaging   CT A/P: 1. Small amount of adherent thrombus or fibrin sheath around the catheter in the LEFT brachiocephalic vein. 2. Small low-attenuation area in the inter lobar fissure region associated with medial segment of LEFT hepatic lobe, new from previous imaging, may represent developing variable fat deposition though given history of peritoneal disease would suggest  attention on subsequent imaging. 3. Diminished thickening of mesenteric reflections, decreased ascites and decreased omental infiltration  since previous imaging. 4. Focal concentric narrowing of the rectosigmoid junction is nonspecific but suspicious for neoplasm potentially related to serosal involvement in the pelvis in this patient with known peritoneal disease from ovarian cancer. Underlying colonic neoplasm would be difficult to exclude as well. Colonic assessment with endoscopy or comparison with recent endoscopy performed is suggested. 5. Cholelithiasis without evidence of acute cholecystitis. 6. 4 mm RIGHT lower pole renal calculus.   Aortic Atherosclerosis (ICD10-I70.0).   03/06/2022 Imaging   CT C/A/P: Chest CT:   Scattered subsegmental pulmonary emboli in the right lung.   Abdominal CT:   1. Mild right hydroureteronephrosis from a 4 mm UVJ calculus. 2. Unchanged omental disease. 3. Cholelithiasis and other chronic findings described above.   05/24/2022 Imaging   Increased peritoneal carcinomatosis and mild ascites.   New moderate distal small bowel obstruction, with transition point in the central pelvis adjacent to the uterine fundus, which may be due to peritoneal carcinomatosis or adhesion.   Cholelithiasis. No radiographic evidence of cholecystitis.   Aortic Atherosclerosis (ICD10-I70.0).   08/21/2022 Imaging   CT A/P: 1. Improved peritoneal carcinomatosis and mild ascites. 2. Prominent/mildly enlarged loops of small bowel throughout the abdomen with transition point in the pelvis, not significantly changed from prior and compatible with a least low-grade obstruction. 3. Cholelithiasis without findings of acute cholecystitis.   10/01/2022 Surgery   Diagnostic laparoscopy, robotic assisted laparoscopic lysis of adhesions, exploratory laparotomy, omentectomy   Findings: On EUA, small uterus within minimal mobility. On intra-abdominal entry, omentum  adherent to the anterior abdominal wall at the level of the transverse colon. Some adhesions noted between the anterior liver surface and the diaphragm on the right. Small miliary disease noted on bilateral diaphragms, small bowel, small bowel mesentery, abdominal and pelvic peritoneum. No ascites. Complete retroperitonealization of the pelvic organs. Multiple loops of small bowel were densely adherent to the uterine fundus without obvious obstruction. Terminal ileum and cecum also adherent along the right pelvic sidewall. Sigmoid colon densely adherent to a loop of ileum and to the lateral aspect of the retroperitonealized pelvis on the left. After opening the retroperitoneum bilaterally and transecting the round ligaments, the bladder flap was able to be partially developed anteriorly. This revealed dense adhesions of the sigmoid to the lateral uterus. Given prior discussions with the patient, I did not feel that proceeding with further attempt at lysis of adhesions (some mobilization of two loops of ileum adherent to the uterine fundus was performed) would be possible without significant risk of injury to multiple loops of small bowel and to the sigmoid colon. 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 On laparotomy, 8 x 4 cm omental cake noted. No disease visualized or palpated within the lesser sac. There was no palpable disease along the liver edges or spleen. Stomach was normal in appearance. Some miliary disease palpated along the abdominal peritoneum. Numerous miliary disease noted along the small bowel and small bowel mesentery. Numerous prominent lymph nodes (1cm or less) within the stalk of the small bowel mesentery.  R1 resection based on miliary disease (in the setting of incomplete pelvic evaluation).   10/01/2022 Pathology Results   A. Pelvic sidewall, right, excision - High-grade serous carcinoma   B. Omentum, resection - High-grade serous  carcinoma - Treatment effect present - See comment   C. Small bowel, implant, excision - Infarcted epiploic appendage - Negative for carcinoma  Immunohistochemical stains were performed on block B1. The tumor cells are positive for WT1,  compatible with tubo-ovarian or peritoneal origin. p53 demonstrates faint positivity in rare cells, favored to be most consistent with a null pattern.    11/05/2022 Genetic Testing   Negative genetic testing on the [facility] Multi-cancer + RNA panel.  The report date is November 05, 2022.  The Multi-Cancer + RNA Panel offered by [facility] includes sequencing and/or deletion/duplication analysis of the following 70 genes:  AIP*, ALK, APC*, ATM*, AXIN2*, BAP1*, BARD1*, BLM*, BMPR1A*, BRCA1*, BRCA2*, BRIP1*, CDC73*, CDH1*, CDK4, CDKN1B*, CDKN2A, CHEK2*, CTNNA1*, DICER1*, EPCAM (del/dup only), EGFR, FH*, FLCN*, GREM1 (promoter dup only), HOXB13, KIT, LZTR1, MAX*, MBD4, MEN1*, MET, MITF, MLH1*, MSH2*, MSH3*, MSH6*, MUTYH*, NF1*, NF2*, NTHL1*, PALB2*, PDGFRA, PMS2*, POLD1*, POLE*, POT1*, PRKAR1A*, PTCH1*, PTEN*, RAD51C*, RAD51D*, RB1*, RET, SDHA* (sequencing only), SDHAF2*, SDHB*, SDHC*, SDHD*, SMAD4*, SMARCA4*, SMARCB1*, SMARCE1*, STK11*, SUFU*, TMEM127*, TP53*, TSC1*, TSC2*, VHL*. RNA analysis is performed for * genes.    01/07/2023 -  Chemotherapy   Patient is on Treatment Plan : OVARIAN Bevacizumab  q21d     08/20/2023 Imaging   CT A/P: 1. No metastatic disease identified within the abdomen or pelvis. 2. Redemonstration of heterogeneous appearance of the uterus and adhesion between the right fundal region of the uterus and terminal ileum without associated bowel obstruction. Findings favor posttreatment changes. 3. Stable 4. Multiple other nonacute observations, as described above.   10/24/2023 Imaging   CT A/P: Posttreatment changes in the pelvis with no convincing evidence for active malignancy within the abdomen or pelvis.     11/04/2023 Imaging   CT A/P: 1. Suspected  small bowel obstruction with narrowing/transition of the distal ileum, possibly on the basis of adhesions. 2. Small right paramidline periumbilical ventral hernia a loop of small bowel, now with interval fluid. Correlate for reducibility. 3. Vague 11 mm peritoneal nodule beneath the right mid anterior abdominal wall, equivocal. Attention on follow-up is suggested. 4. Cholelithiasis without associated inflammatory changes.    Component Ref Range & Units (hover) 9 d ago (11/13/23) 1 mo ago (10/14/23) 2 mo ago (09/23/23) 2 mo ago (09/02/23) 2 mo ago (08/26/23) 3 mo ago (08/05/23) 4 mo ago (07/15/23)  Cancer Antigen (CA) 125 219.0 High  173.0 High  CM 137.0 High  CM 101.0 High  CM 72.7 High  CM 53.9 High  CM 41.3 High  CM   Interval History: She was recently admitted in early June for small bowel obstruction in the setting of suspected adhesions, possible peritoneal implant.  She was also noted to have a small right periumbilical ventral hernia.  This was treated evidently with return of bowel function.  Since going home, the patient reports doing overall okay.  She is tolerating a soft diet without any significant abdominal pain like she was having previously.  She is having good bowel function, sometimes solid stool and sometimes loose stools.  She is drinking although notes that she is probably not drinking enough, which is her baseline.  She has lost about 10 pounds since making dietary changes.  She denies any urinary symptoms.  Denies any nausea or emesis.  For the last 2 weeks, she began having multiple symptoms including some pain in her throat where she feels like things get stuck or there is swelling in her throat.  She also began feeling that the skin over her left neck was hot with pain radiating to her jaw, ear and around to the back of her head causing a headache.  She does not have pain to the touch with her neck currently but  does still feel that the skin is intermittently hot.  Saw  somebody in her PCPs office who prescribed prednisone .  She has not started this yet.  Past Medical/Surgical History: Past Medical History:  Diagnosis Date   Acute pulmonary embolism (HCC) 03/06/2022   Acute respiratory disease due to COVID-19 virus 02/02/2019   Anxiety    Cancer (HCC)    Depression    Dyspnea    Dysrhythmia    Family history of bladder cancer    Family history of breast cancer    History of diabetes mellitus    Resolved in Sept 2023 with weight loss.   Hypertension    Ovarian cancer (HCC) 11/14/2021   Peritoneal carcinomatosis (HCC)    Pneumonia    Sleep apnea    doesn't use CPAP   Tachycardia     Past Surgical History:  Procedure Laterality Date   KNEE SURGERY Right    Arthroscopic   LAPAROSCOPIC ABDOMINAL EXPLORATION N/A 11/13/2021   Procedure: Exploratory laparoscopy;  Surgeon: Alanda Allegra, MD;  Location: [facility];  Service: General;  Laterality: N/A;   PORTACATH PLACEMENT Left 11/15/2021   Procedure: INSERTION PORT-A-CATH;  Surgeon: Alanda Allegra, MD;  Location: [facility];  Service: General;  Laterality: Left;   TONSILLECTOMY AND ADENOIDECTOMY      Family History  Problem Relation Age of Onset   Hypertension Mother    Heart attack Paternal Uncle    Bladder Cancer Maternal Grandmother 13       non-smoker   Dementia Maternal Grandfather    Breast cancer Maternal Great-grandmother        MGF's mother   Colon cancer Neg Hx    Ovarian cancer Neg Hx    Endometrial cancer Neg Hx    Pancreatic cancer Neg Hx    Prostate cancer Neg Hx     Social History   Socioeconomic History   Marital status: Married    Spouse name: Not on file   Number of children: Not on file   Years of education: Not on file   Highest education level: Not on file  Occupational History   Occupation: Unemployed  Tobacco Use   Smoking status: Never   Smokeless tobacco: Never  Vaping Use   Vaping status: Never Used  Substance and Sexual Activity   Alcohol use: No   Drug  use: No   Sexual activity: Not Currently  Other Topics Concern   Not on file  Social History Narrative   Not on file   Social Drivers of Health   Financial Resource Strain: Not on file  Food Insecurity: No Food Insecurity (11/04/2023)   Hunger Vital Sign    Worried About Running Out of Food in the Last Year: Never true    Ran Out of Food in the Last Year: Never true  Transportation Needs: No Transportation Needs (11/04/2023)   PRAPARE - Administrator, Civil Service (Medical): No    Lack of Transportation (Non-Medical): No  Physical Activity: Not on file  Stress: Not on file  Social Connections: Not on file    Current Medications:  Current Outpatient Medications:    aspirin  EC 81 MG tablet, Take 1 tablet (81 mg total) by mouth daily. Swallow whole., Disp: , Rfl:    atorvastatin  (LIPITOR) 20 MG tablet, Take 1 tablet (20 mg total) by mouth at bedtime., Disp: 90 tablet, Rfl: 3   diphenoxylate -atropine  (LOMOTIL ) 2.5-0.025 MG tablet, Take 2 tablets by mouth after the first watery bowel movement. May  then take 1 pill by mouth after each watery bowel movement. Do not exceed 8 pills in a 24 hour period., Disp: 60 tablet, Rfl: 3   DULoxetine  (CYMBALTA ) 30 MG capsule, Take 2 capsules (60 mg total) by mouth daily., Disp: , Rfl:    lidocaine -prilocaine  (EMLA ) cream, Apply a quarter sized amount to port a cath site and cover with plastic wrap 1 hour prior to infusion appointments, Disp: 30 g, Rfl: 3   LYNPARZA  150 MG tablet, TAKE 2 TABLETS TWICE A DAY. MAY TAKE WITH FOOD TO DECEASE NAUSEA AND VOMITING. (SWALLOW WHOLE), Disp: 360 tablet, Rfl: 1   magnesium  oxide (MAG-OX) 400 (240 Mg) MG tablet, TAKE 1 TABLET BY MOUTH TWICE A DAY, Disp: 180 tablet, Rfl: 1   Multiple Vitamin (MULTIVITAMIN) capsule, Take 1 capsule by mouth daily., Disp: , Rfl:    ondansetron  (ZOFRAN ) 4 MG tablet, Take 1 tablet (4 mg total) by mouth every 6 (six) hours as needed for nausea., Disp: 20 tablet, Rfl: 0    polyethylene glycol (MIRALAX  / GLYCOLAX ) 17 g packet, Take 17 g by mouth daily., Disp: 30 each, Rfl: 3   Potassium 99 MG TABS, Take 99 mg by mouth at bedtime., Disp: , Rfl:    predniSONE  (DELTASONE ) 20 MG tablet, Take 2 tablets (40 mg total) by mouth daily with breakfast for 5 days. 2 po daily for 5 days, Disp: 10 tablet, Rfl: 0 No current facility-administered medications for this visit.  Facility-Administered Medications Ordered in Other Visits:    heparin  lock flush 100 unit/mL, 500 Units, Intravenous, Once, Paulett Boros, MD   sodium chloride  flush (NS) 0.9 % injection 10 mL, 10 mL, Intravenous, PRN, Katragadda, Sreedhar, MD   sodium chloride  flush (NS) 0.9 % injection 10 mL, 10 mL, Intracatheter, PRN, Katragadda, Sreedhar, MD, 10 mL at 10/14/23 1610  Review of Systems: + fatigue, voice changes, headache Denies appetite changes, fevers, chills, unexplained weight changes. Denies hearing loss, neck lumps or masses, mouth sores, ringing in ears. Denies cough or wheezing.  Denies shortness of breath. Denies chest pain or palpitations. Denies leg swelling. Denies abdominal distention, pain, blood in stools, constipation, diarrhea, nausea, vomiting, or early satiety. Denies pain with intercourse, dysuria, frequency, hematuria or incontinence. Denies hot flashes, pelvic pain, vaginal bleeding or vaginal discharge.   Denies joint pain, back pain or muscle pain/cramps. Denies itching, rash, or wounds. Denies dizziness, numbness or seizures. Denies swollen lymph nodes or glands, denies easy bruising or bleeding. Denies anxiety, depression, confusion, or decreased concentration.  Physical Exam: BP 121/74 (BP Location: Left Wrist, Patient Position: Sitting)   Pulse (!) 123 Comment: CMA notified  Temp 99.8 F (37.7 C) (Tympanic)   Resp 18   Ht 5' 2 (1.575 m)   Wt 189 lb 6.4 oz (85.9 kg)   SpO2 98%   BMI 34.64 kg/m  General: Alert, oriented, no acute distress. HEENT: Posterior  oropharynx clear, sclera anicteric.  Mild swelling of the left neck tissue without pain on palpation, no erythema or warmth. Chest: Clear to auscultation bilaterally.  No wheezes or rhonchi. Cardiovascular: Mildly tachycardic, regular rhythm, no murmurs. Abdomen: Obese, soft, nontender.  Normoactive bowel sounds.  No masses or hepatosplenomegaly appreciated. Extremities: Grossly normal range of motion.  Warm, well perfused.  No edema bilaterally.  Laboratory & Radiologic Studies:    Latest Ref Rng & Units 11/13/2023    9:05 AM 11/07/2023    2:52 PM 11/07/2023    4:18 AM  CBC  WBC 4.0 - 10.5 K/uL  6.4   3.7   Hemoglobin 12.0 - 15.0 g/dL 16.1  9.8  7.9   Hematocrit 36.0 - 46.0 % 34.4  28.9  24.7   Platelets 150 - 400 K/uL 176   93       Latest Ref Rng & Units 11/13/2023    9:05 AM 11/07/2023    4:18 AM 11/06/2023    3:40 AM  BMP  Glucose 70 - 99 mg/dL 096  82  75   BUN 6 - 20 mg/dL 12  9  9    Creatinine 0.44 - 1.00 mg/dL 0.45  4.09  8.11   Sodium 135 - 145 mmol/L 137  142  139   Potassium 3.5 - 5.1 mmol/L 3.9  3.4  3.4   Chloride 98 - 111 mmol/L 102  105  105   CO2 22 - 32 mmol/L 24  28  27    Calcium  8.9 - 10.3 mg/dL 9.1  8.2  8.2    Assessment & Plan: Nicole Santos is a 53 y.o. 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 S/p 3 cycles of adj platinum-based therapy after surgery, now on maintenance avastin /olaparib  in the setting of HRD+ tumor. P53 mutated. Her2 2+. The patient was recently admitted in the setting of partial versus complete small bowel obstruction that improved with conservative management.  Patient has had throat and neck symptoms for the last 2 weeks.  We discussed these in detail today.  While her throat and swelling symptoms may be related to trauma from the NG tube, I do not have a good explanation for the side of her neck symptoms with radiation to her jaw, ear, and back of her head.  She has some asymmetric  swelling on the left side although I do not appreciate any erythema or warmth compared to the right side.  Reached out to radiology regarding symptoms with recommendation to proceed either with CT or MRI of the neck.  CT ordered today.  She was somewhat tachycardic on presentation today, improved on exam after she had been sitting in clinic.  Reports that otherwise feels fine but attributes her heart rate to the pain she is having in her neck.  Discussed her GI function, likely still some element of partial SBO.  She is tolerating soft diet without significant issues and limiting foods that seem to trigger symptoms such as increased gas production. 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  After looking at her images, I am suspicious that it is pelvic adhesions and not her hernia or peritoneal disease that caused her recent SBO.  Increasing CA125 certainly raises the concern for possible disease progression below the imaging threshold to be able to detect disease.  There was one possible peritoneal nodule on her recent CT scan that will need close follow-up.  She has previously discussed with her medical oncology returning to cytotoxic chemotherapy.  While this would be reasonable, given her overall low symptom burden and toleration of maintenance therapy without definitive CT evidence of recurrent cancer, I think it would be reasonable to continue her on Olaparib  with close follow-up of her CA125 and imaging.  38 minutes of total time was spent for this patient encounter, including preparation, face-to-face counseling with the patient and coordination of care, and documentation of the encounter.  Wiley Hanger, MD  Division of Gynecologic Oncology  Department of Obstetrics and Gynecology  [facility]

## 2023-11-22 NOTE — Patient Instructions (Addendum)
 We have placed an order for gastroenterology. You should receive a phone call for this appointment.  We have also placed an order for a CT scan of the neck to further evaluate your symptoms.

## 2023-11-23 ENCOUNTER — Other Ambulatory Visit: Payer: Self-pay

## 2023-11-26 ENCOUNTER — Encounter: Payer: Self-pay | Admitting: Internal Medicine

## 2023-11-27 ENCOUNTER — Encounter: Payer: Self-pay | Admitting: Oncology

## 2023-11-27 NOTE — Progress Notes (Signed)
 Requested FOLR1 testing on accession [id number] with pathology via email.

## 2023-12-02 ENCOUNTER — Ambulatory Visit (HOSPITAL_COMMUNITY)
Admission: RE | Admit: 2023-12-02 | Discharge: 2023-12-02 | Disposition: A | Discharge status: Home / Self Care | Source: Ambulatory Visit | Attending: Gynecologic Oncology | Admitting: Gynecologic Oncology

## 2023-12-02 ENCOUNTER — Encounter (HOSPITAL_COMMUNITY): Payer: Self-pay | Admitting: Gynecologic Oncology

## 2023-12-02 DIAGNOSIS — R1310 Dysphagia, unspecified: Secondary | ICD-10-CM | POA: Insufficient documentation

## 2023-12-02 DIAGNOSIS — M542 Cervicalgia: Secondary | ICD-10-CM | POA: Insufficient documentation

## 2023-12-02 DIAGNOSIS — C579 Malignant neoplasm of female genital organ, unspecified: Secondary | ICD-10-CM | POA: Diagnosis present

## 2023-12-02 MED ORDER — IOHEXOL 300 MG/ML  SOLN
75.0000 mL | Freq: Once | INTRAMUSCULAR | Status: AC | PRN
  Administered 2023-12-02: 75 mL via INTRAVENOUS

## 2023-12-02 MED ORDER — SODIUM CHLORIDE (PF) 0.9 % IJ SOLN
INTRAMUSCULAR | Status: AC
  Filled 2023-12-02: qty 50

## 2023-12-03 NOTE — Progress Notes (Signed)
 [facility] [address] [name] [address]    Clinic Day:  12/04/2023  Referring physician: Jolinda Norene HERO, DO  Patient Care Team: Jolinda Norene HERO, DO as PCP - General (Family Medicine) Ladora Ross Lacy Phebe, MD as Referring Physician (Optometry) Rogers Hai, MD as Medical Oncologist (Medical Oncology) Celestia Joesph SQUIBB, RN as Oncology Nurse Navigator (Medical Oncology)   ASSESSMENT & PLAN:   Assessment: 1. High-grade serous carcinoma of peritoneum: - She had developed sharp left-sided abdominal pain since Mother's Day, occasional radiating to the back.  40 pound weight loss in the last 6 months. - Personal history of cervical cancer in 2000, status post chemo plus XRT at [facility].  She has slightly decreased hearing in the left ear since then. - US  abdomen (11/08/2021): Cholelithiasis with gallbladder wall thickening and sonographic Murphy sign.  Upper abdominal ascites. - Laparoscopy (11/13/2021): Starting of the omentum as well as the upper abdominal wall and diaphragm.  Liver was within normal limits.  6 L of ascites was evacuated.  Omental and peritoneal biopsies were sent.  Peritoneal implants throughout and a significant portion of omentum and on the small intestine. - CT CAP (11/13/2021): Ascites, extensive peritoneal thickening, nodularity and omental caking.  No evidence of primary malignancy in the chest, abdomen or pelvis.  Small left pleural effusion, nonspecific.  Cholelithiasis. - US  pelvis (11/14/2021): Uterus is grossly unremarkable.  Ovaries not visualized. - CA125 (11/13/2021): 1269 - Pathology: Peritoneum and omental biopsy consistent with high-grade serous carcinoma.  HER2 2+ by IHC and negative by FISH. - 4 cycles of carboplatin  and paclitaxel  from 11/21/2021 through 01/23/2022, cycle 9 on 08/29/2022. - 10/01/2022: Diagnostic laparoscopy, exploratory laparotomy, omentectomy - Pathology: Right pelvic sidewall excision-high-grade  serous carcinoma, omental resection-high-grade serous carcinoma with treatment effect present.  Small bowel implant excision was negative for carcinoma. - Germline mutation testing negative - Foundation 1 NGS (10/01/2022): LOH-18.9%, HRD +, MS-stable, TMB-low - Olaparib  300 mg twice daily and bevacizumab  maintenance started on 01/07/2023.  Lynparza  held when she was hospitalized from 11/04/2023 through 11/07/2023 with partial SBO.  Lynparza  restarted back on 12/03/2023.  Bevacizumab  on hold.  2. Social/family history: - Lives at home with her husband.  Worked in Engineering geologist at [employer].  Non-smoker. - Maternal grandmother with bladder cancer.  Maternal great grandmother with breast cancer.    Plan: High-grade serous carcinoma of peritoneum: - Hospitalized from 11/04/2023 through 11/07/2023 with partial small bowel obstruction (vomiting and abdominal pain), which resolved with conservative measures. - She is eating soft foods.  She tried eating broccoli which caused her abdominal pain last week which lasted about 2 days.  She was able to eat a burger without any problems. - Restaging CT (10/24/2023): Posttreatment changes in the pelvis with no convincing evidence of active malignancy. - CTAP (11/04/2023): Suspected small bowel obstruction with narrowing/transition of the distal ileum, possibly on the basis of adhesions.  Vague 11 mm peritoneal nodule beneath right mid anterior abdominal wall equivocal. - She met with Dr. Viktoria on 11/22/2023.  Dr. Viktoria has recommended to continue Lynparza  rather than starting on chemotherapy as she is asymptomatic. - We will start back on Lynparza  and hold off on bevacizumab . - We did labs today: LFTs are normal.  CBC with mild leukopenia and thrombocytopenia at 3.7 and 132.  Hemoglobin is 9.5.  CA125 is pending.  Last CA125 was 219 on 11/13/2023, trending up. - She reported right lateral lower rib pain/tenderness for the last 2 years.  It is stable.  Recommend continue Lynparza  300 mg  twice daily.  RTC 4 weeks for follow-up with repeat labs.   2.  Peripheral neuropathy: - Continue Cymbalta  60 mg daily.  Neuropathy and bottom of the feet left more than right is stable.  3.  Hypomagnesemia: - Continue magnesium  3 times daily.  Magnesium  is slightly low at 1.6.  4.  Hypokalemia: - Continue K-Dur 20 mg daily.  Potassium is 3.4 today.   5.  Subsegmental pulmonary embolism (Dx 03/06/2022): - When she was hospitalized, hemoglobin dropped down to 7.9, likely from hemodilution.  She also had intermittent rectal bleeding.  Eliquis  was held at that time. - She will follow-up with Dr. Leigh for colonoscopy.  In the interim she will continue aspirin  daily for prophylaxis.    Orders Placed This Encounter  Procedures   CBC with Differential    Standing Status:   Future    Expected Date:   12/25/2023    Expiration Date:   03/24/2024   Comprehensive metabolic panel    Standing Status:   Future    Expected Date:   12/25/2023    Expiration Date:   03/24/2024   Magnesium     Standing Status:   Future    Expected Date:   12/25/2023    Expiration Date:   03/24/2024   CA 125    Standing Status:   Future    Expected Date:   12/25/2023    Expiration Date:   03/24/2024      [name],acting as a scribe for Alean Stands, MD.,have documented all relevant documentation on the behalf of Alean Stands, MD,as directed by  Alean Stands, MD while in the presence of Alean Stands, MD.  I, Alean Stands MD, have reviewed the above documentation for accuracy and completeness, and I agree with the above.      Alean Stands, MD   7/2/202512:47 PM  CHIEF COMPLAINT:   Diagnosis: high-grade serous carcinoma of peritoneum    Cancer Staging  Primary peritoneal carcinomatosis [facility]) Staging form: Ovary, Fallopian Tube, and Primary Peritoneal Carcinoma, AJCC 8th Edition - Clinical stage from 11/17/2021: FIGO Stage IIIC (cT3c, cM0) - Unsigned     Prior Therapy: Carboplatin  and paclitaxel -- x 4 cycles 11/21/2021 - 01/23/2022, x5 cycles 06/05/22 - 08/29/22, x3 cycles 11/05/22 - 12/17/22   Current Therapy:  olaparib  and maintenance bevacizumab     HISTORY OF PRESENT ILLNESS:   Oncology History  Malignant neoplasm of ovary (HCC)  11/15/2021 Initial Diagnosis   Malignant neoplasm of ovary (HCC)   01/07/2023 -  Chemotherapy   Patient is on Treatment Plan : OVARIAN Bevacizumab  q21d     05/24/2023 Imaging   CT A/P 1. Similar slightly heterogeneous appearance of the uterus with tethered appearance of the terminal ileum and sigmoid colon to the no evidence of new or progressive disease in the abdomen or pelvis. 2. Cholelithiasis without findings of acute cholecystitis. 3.  Aortic Atherosclerosis (ICD10-I70.0).   08/20/2023 Imaging   CT A/P: 1. No metastatic disease identified within the abdomen or pelvis. 2. Redemonstration of heterogeneous appearance of the uterus and adhesion between the right fundal region of the uterus and terminal ileum without associated bowel obstruction. Findings favor posttreatment changes. 3. Stable 4. Multiple other nonacute observations, as described above.   10/24/2023 Imaging   CT A/P: Posttreatment changes in the pelvis with no convincing evidence for active malignancy within the abdomen or pelvis.     11/04/2023 Imaging   CT A/P: 1. Suspected  small bowel obstruction with narrowing/transition of the distal ileum, possibly on the basis of adhesions. 2. Small right paramidline periumbilical ventral hernia a loop of small bowel, now with interval fluid. Correlate for reducibility. 3. Vague 11 mm peritoneal nodule beneath the right mid anterior abdominal wall, equivocal. Attention on follow-up is suggested. 4. Cholelithiasis without associated inflammatory changes.   Primary peritoneal carcinomatosis (HCC)  11/13/2021 Imaging   CT A/P: 1. Trace ascites throughout the abdomen and pelvis, with  extensive peritoneal thickening, nodularity, and omental caking. Findings are consistent with widespread peritoneal and omental metastatic disease. 2. No evidence of primary malignancy in the chest, abdomen, or pelvis, with specific attention to the most likely etiologies of peritoneal metastatic disease including pancreatic, ovarian, and endometrial malignancy. Although there is no obvious mass, pelvic ultrasound may be helpful to more closely assess the female reproductive organs. 3. Small left pleural effusion and associated atelectasis or consolidation, nonspecific although worrisome for malignant effusion. No overt pleural thickening or nodularity. 4. Scattered areas of nonspecific infectious or inflammatory alveolar ground-glass airspace opacity throughout the lungs. 5. Cholelithiasis without evidence of acute cholecystitis. 6. Nonobstructive right nephrolithiasis. 7. Postoperative findings of same day laparoscopy including minimal pneumoperitoneum.   Aortic Atherosclerosis (ICD10-I70.0).   11/13/2021 Surgery   Diagnostic laparoscopy with biopsy Dr. Mavis Findings: Significant bloody ascites, studding of the omentum as well as the upper abdominal wall and diaphragm.  Nodularity noted along the small intestine.  Omental disease inhibited view of the pelvis.   11/17/2021 Initial Diagnosis   Primary peritoneal carcinomatosis (HCC)   11/21/2021 - 01/25/2022 Chemotherapy   Patient is on Treatment Plan : OVARIAN Carboplatin  (AUC 6) / Paclitaxel  (175) q21d x 6 cycles     11/21/2021 - 12/17/2022 Chemotherapy   Patient is on Treatment Plan : OVARIAN Carboplatin  (AUC 6) + Paclitaxel  (175)  + Mvasi  q21d X 6 Cycles     01/19/2022 Imaging   CT A/P: 1. Small amount of adherent thrombus or fibrin sheath around the catheter in the LEFT brachiocephalic vein. 2. Small low-attenuation area in the inter lobar fissure region associated with medial segment of LEFT hepatic lobe, new from previous  imaging, may represent developing variable fat deposition though given history of peritoneal disease would suggest attention on subsequent imaging. 3. Diminished thickening of mesenteric reflections, decreased ascites and decreased omental infiltration since previous imaging. 4. Focal concentric narrowing of the rectosigmoid junction is nonspecific but suspicious for neoplasm potentially related to serosal involvement in the pelvis in this patient with known peritoneal disease from ovarian cancer. Underlying colonic neoplasm would be difficult to exclude as well. Colonic assessment with endoscopy or comparison with recent endoscopy performed is suggested. 5. Cholelithiasis without evidence of acute cholecystitis. 6. 4 mm RIGHT lower pole renal calculus.   Aortic Atherosclerosis (ICD10-I70.0).   03/06/2022 Imaging   CT C/A/P: Chest CT:   Scattered subsegmental pulmonary emboli in the right lung.   Abdominal CT:   1. Mild right hydroureteronephrosis from a 4 mm UVJ calculus. 2. Unchanged omental disease. 3. Cholelithiasis and other chronic findings described above.   05/24/2022 Imaging   Increased peritoneal carcinomatosis and mild ascites.   New moderate distal small bowel obstruction, with transition point in the central pelvis adjacent to the uterine fundus, which may be due to peritoneal carcinomatosis or adhesion.   Cholelithiasis. No radiographic evidence of cholecystitis.   Aortic Atherosclerosis (ICD10-I70.0).   08/21/2022 Imaging   CT A/P: 1. Improved peritoneal carcinomatosis and mild ascites. 2. Prominent/mildly enlarged loops of  small bowel throughout the abdomen with transition point in the pelvis, not significantly changed from prior and compatible with a least low-grade obstruction. 3. Cholelithiasis without findings of acute cholecystitis.   10/01/2022 Surgery   Diagnostic laparoscopy, robotic assisted laparoscopic lysis of adhesions, exploratory  laparotomy, omentectomy   Findings: On EUA, small uterus within minimal mobility. On intra-abdominal entry, omentum adherent to the anterior abdominal wall at the level of the transverse colon. Some adhesions noted between the anterior liver surface and the diaphragm on the right. Small miliary disease noted on bilateral diaphragms, small bowel, small bowel mesentery, abdominal and pelvic peritoneum. No ascites. Complete retroperitonealization of the pelvic organs. Multiple loops of small bowel were densely adherent to the uterine fundus without obvious obstruction. Terminal ileum and cecum also adherent along the right pelvic sidewall. Sigmoid colon densely adherent to a loop of ileum and to the lateral aspect of the retroperitonealized pelvis on the left. After opening the retroperitoneum bilaterally and transecting the round ligaments, the bladder flap was able to be partially developed anteriorly. This revealed dense adhesions of the sigmoid to the lateral uterus. Given prior discussions with the patient, I did not feel that proceeding with further attempt at lysis of adhesions (some mobilization of two loops of ileum adherent to the uterine fundus was performed) would be possible without significant risk of injury to multiple loops of small bowel and to the sigmoid colon. 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 On laparotomy, 8 x 4 cm omental cake noted. No disease visualized or palpated within the lesser sac. There was no palpable disease along the liver edges or spleen. Stomach was normal in appearance. Some miliary disease palpated along the abdominal peritoneum. Numerous miliary disease noted along the small bowel and small bowel mesentery. Numerous prominent lymph nodes (1cm or less) within the stalk of the small bowel mesentery.  R1 resection based on miliary disease (in the setting of incomplete pelvic evaluation).   10/01/2022 Pathology Results    A. Pelvic sidewall, right, excision - High-grade serous carcinoma   B. Omentum, resection - High-grade serous carcinoma - Treatment effect present - See comment   C. Small bowel, implant, excision - Infarcted epiploic appendage - Negative for carcinoma  Immunohistochemical stains were performed on block B1. The tumor cells are positive for WT1, compatible with tubo-ovarian or peritoneal origin. p53 demonstrates faint positivity in rare cells, favored to be most consistent with a null pattern.    11/05/2022 Genetic Testing   Negative genetic testing on the [facility] Multi-cancer + RNA panel.  The report date is November 05, 2022.  The Multi-Cancer + RNA Panel offered by [facility] includes sequencing and/or deletion/duplication analysis of the following 70 genes:  AIP*, ALK, APC*, ATM*, AXIN2*, BAP1*, BARD1*, BLM*, BMPR1A*, BRCA1*, BRCA2*, BRIP1*, CDC73*, CDH1*, CDK4, CDKN1B*, CDKN2A, CHEK2*, CTNNA1*, DICER1*, EPCAM (del/dup only), EGFR, FH*, FLCN*, GREM1 (promoter dup only), HOXB13, KIT, LZTR1, MAX*, MBD4, MEN1*, MET, MITF, MLH1*, MSH2*, MSH3*, MSH6*, MUTYH*, NF1*, NF2*, NTHL1*, PALB2*, PDGFRA, PMS2*, POLD1*, POLE*, POT1*, PRKAR1A*, PTCH1*, PTEN*, RAD51C*, RAD51D*, RB1*, RET, SDHA* (sequencing only), SDHAF2*, SDHB*, SDHC*, SDHD*, SMAD4*, SMARCA4*, SMARCB1*, SMARCE1*, STK11*, SUFU*, TMEM127*, TP53*, TSC1*, TSC2*, VHL*. RNA analysis is performed for * genes.    01/07/2023 -  Chemotherapy   Patient is on Treatment Plan : OVARIAN Bevacizumab  q21d     08/20/2023 Imaging   CT A/P: 1. No metastatic disease identified within the abdomen or pelvis. 2. Redemonstration of heterogeneous appearance of the uterus and  adhesion between the right fundal region of the uterus and terminal ileum without associated bowel obstruction. Findings favor posttreatment changes. 3. Stable 4. Multiple other nonacute observations, as described above.   10/24/2023 Imaging   CT A/P: Posttreatment changes in the pelvis with no  convincing evidence for active malignancy within the abdomen or pelvis.     11/04/2023 Imaging   CT A/P: 1. Suspected small bowel obstruction with narrowing/transition of the distal ileum, possibly on the basis of adhesions. 2. Small right paramidline periumbilical ventral hernia a loop of small bowel, now with interval fluid. Correlate for reducibility. 3. Vague 11 mm peritoneal nodule beneath the right mid anterior abdominal wall, equivocal. Attention on follow-up is suggested. 4. Cholelithiasis without associated inflammatory changes.      INTERVAL HISTORY:   Nicole Santos is a 53 y.o. female presenting to clinic today for follow up of high-grade serous carcinoma of peritoneum. She was last seen by me on 11/13/23.  Today, she states that she is doing well overall. Her appetite level is at 50%. Her energy level is at 20%.  PAST MEDICAL HISTORY:   Past Medical History: Past Medical History:  Diagnosis Date   Acute pulmonary embolism (HCC) 03/06/2022   Acute respiratory disease due to COVID-19 virus 02/02/2019   Anxiety    Cancer (HCC)    Depression    Dyspnea    Dysrhythmia    Family history of bladder cancer    Family history of breast cancer    History of diabetes mellitus    Resolved in Sept 2023 with weight loss.   Hypertension    Ovarian cancer (HCC) 11/14/2021   Peritoneal carcinomatosis (HCC)    Pneumonia    Sleep apnea    doesn't use CPAP   Tachycardia     Surgical History: Past Surgical History:  Procedure Laterality Date   KNEE SURGERY Right    Arthroscopic   LAPAROSCOPIC ABDOMINAL EXPLORATION N/A 11/13/2021   Procedure: Exploratory laparoscopy;  Surgeon: Mavis Anes, MD;  Location: [facility];  Service: General;  Laterality: N/A;   PORTACATH PLACEMENT Left 11/15/2021   Procedure: INSERTION PORT-A-CATH;  Surgeon: Mavis Anes, MD;  Location: [facility];  Service: General;  Laterality: Left;   TONSILLECTOMY AND ADENOIDECTOMY      Social History: Social History    Socioeconomic History   Marital status: Married    Spouse name: Not on file   Number of children: Not on file   Years of education: Not on file   Highest education level: Not on file  Occupational History   Occupation: Unemployed  Tobacco Use   Smoking status: Never   Smokeless tobacco: Never  Vaping Use   Vaping status: Never Used  Substance and Sexual Activity   Alcohol use: No   Drug use: No   Sexual activity: Not Currently  Other Topics Concern   Not on file  Social History Narrative   Not on file   Social Drivers of Health   Financial Resource Strain: Not on file  Food Insecurity: No Food Insecurity (11/04/2023)   Hunger Vital Sign    Worried About Running Out of Food in the Last Year: Never true    Ran Out of Food in the Last Year: Never true  Transportation Needs: No Transportation Needs (11/04/2023)   PRAPARE - Administrator, Civil Service (Medical): No    Lack of Transportation (Non-Medical): No  Physical Activity: Not on file  Stress: Not on file  Social Connections: Not on  file  Intimate Partner Violence: Not At Risk (11/04/2023)   Humiliation, Afraid, Rape, and Kick questionnaire    Fear of Current or Ex-Partner: No    Emotionally Abused: No    Physically Abused: No    Sexually Abused: No    Family History: Family History  Problem Relation Age of Onset   Hypertension Mother    Heart attack Paternal Uncle    Bladder Cancer Maternal Grandmother 42       non-smoker   Dementia Maternal Grandfather    Breast cancer Maternal Great-grandmother        MGF's mother   Colon cancer Neg Hx    Ovarian cancer Neg Hx    Endometrial cancer Neg Hx    Pancreatic cancer Neg Hx    Prostate cancer Neg Hx     Current Medications:  Current Outpatient Medications:    atorvastatin  (LIPITOR) 20 MG tablet, Take 1 tablet (20 mg total) by mouth at bedtime., Disp: 90 tablet, Rfl: 3   diphenoxylate -atropine  (LOMOTIL ) 2.5-0.025 MG tablet, Take 2 tablets by mouth  after the first watery bowel movement. May then take 1 pill by mouth after each watery bowel movement. Do not exceed 8 pills in a 24 hour period., Disp: 60 tablet, Rfl: 3   DULoxetine  (CYMBALTA ) 30 MG capsule, Take 2 capsules (60 mg total) by mouth daily., Disp: , Rfl:    ELIQUIS  5 MG TABS tablet, Take 5 mg by mouth 2 (two) times daily., Disp: , Rfl:    lidocaine -prilocaine  (EMLA ) cream, Apply a quarter sized amount to port a cath site and cover with plastic wrap 1 hour prior to infusion appointments, Disp: 30 g, Rfl: 3   LYNPARZA  150 MG tablet, TAKE 2 TABLETS TWICE A DAY. MAY TAKE WITH FOOD TO DECEASE NAUSEA AND VOMITING. (SWALLOW WHOLE), Disp: 360 tablet, Rfl: 1   magnesium  oxide (MAG-OX) 400 (240 Mg) MG tablet, TAKE 1 TABLET BY MOUTH TWICE A DAY, Disp: 180 tablet, Rfl: 1   Multiple Vitamin (MULTIVITAMIN) capsule, Take 1 capsule by mouth daily., Disp: , Rfl:    ondansetron  (ZOFRAN ) 4 MG tablet, Take 1 tablet (4 mg total) by mouth every 6 (six) hours as needed for nausea., Disp: 20 tablet, Rfl: 0   polyethylene glycol (MIRALAX  / GLYCOLAX ) 17 g packet, Take 17 g by mouth daily., Disp: 30 each, Rfl: 3   Potassium 99 MG TABS, Take 99 mg by mouth at bedtime., Disp: , Rfl:  No current facility-administered medications for this visit.  Facility-Administered Medications Ordered in Other Visits:    heparin  lock flush 100 unit/mL, 500 Units, Intravenous, Once, Rogers Hai, MD   sodium chloride  flush (NS) 0.9 % injection 10 mL, 10 mL, Intravenous, PRN, Kerrick Miler, MD   sodium chloride  flush (NS) 0.9 % injection 10 mL, 10 mL, Intracatheter, PRN, Jady Braggs, MD, 10 mL at 10/14/23 1610   Allergies: Allergies  Allergen Reactions   Augmentin  [Amoxicillin -Pot Clavulanate] Diarrhea and Nausea And Vomiting   Doxycycline     Abdominal cramps.   Topamax  [Topiramate ] Nausea And Vomiting    REVIEW OF SYSTEMS:   Review of Systems  Constitutional:  Negative for chills, fatigue and  fever.  HENT:   Negative for lump/mass, mouth sores, nosebleeds, sore throat and trouble swallowing.   Eyes:  Negative for eye problems.  Respiratory:  Positive for shortness of breath. Negative for cough.   Cardiovascular:  Negative for chest pain, leg swelling and palpitations.  Gastrointestinal:  Positive for diarrhea. Negative for abdominal  pain, constipation, nausea and vomiting.  Genitourinary:  Negative for bladder incontinence, difficulty urinating, dysuria, frequency, hematuria and nocturia.   Musculoskeletal:  Negative for arthralgias, back pain, flank pain, myalgias and neck pain.  Skin:  Negative for itching and rash.  Neurological:  Positive for numbness. Negative for dizziness and headaches.  Hematological:  Does not bruise/bleed easily.  Psychiatric/Behavioral:  Negative for depression, sleep disturbance and suicidal ideas. The patient is not nervous/anxious.   All other systems reviewed and are negative.    VITALS:   Blood pressure 125/84, pulse (!) 108, temperature 98.2 F (36.8 C), temperature source Tympanic, resp. rate 20, weight 186 lb 1.1 oz (84.4 kg), SpO2 100%.  Wt Readings from Last 3 Encounters:  12/04/23 186 lb 1.1 oz (84.4 kg)  11/22/23 189 lb 6.4 oz (85.9 kg)  11/19/23 189 lb (85.7 kg)    Body mass index is 34.03 kg/m.  Performance status (ECOG): 1 - Symptomatic but completely ambulatory  PHYSICAL EXAM:   Physical Exam Vitals and nursing note reviewed. Exam conducted with a chaperone present.  Constitutional:      Appearance: Normal appearance.  Cardiovascular:     Rate and Rhythm: Normal rate and regular rhythm.     Pulses: Normal pulses.     Heart sounds: Normal heart sounds.  Pulmonary:     Effort: Pulmonary effort is normal.     Breath sounds: Normal breath sounds.  Abdominal:     Palpations: Abdomen is soft. There is no hepatomegaly, splenomegaly or mass.     Tenderness: There is no abdominal tenderness.  Musculoskeletal:     Right lower  leg: No edema.     Left lower leg: No edema.  Lymphadenopathy:     Cervical: No cervical adenopathy.     Right cervical: No superficial, deep or posterior cervical adenopathy.    Left cervical: No superficial, deep or posterior cervical adenopathy.     Upper Body:     Right upper body: No supraclavicular or axillary adenopathy.     Left upper body: No supraclavicular or axillary adenopathy.  Neurological:     General: No focal deficit present.     Mental Status: She is alert and oriented to person, place, and time.  Psychiatric:        Mood and Affect: Mood normal.        Behavior: Behavior normal.     LABS:   CBC     Component Value Date/Time   WBC 3.7 (L) 12/04/2023 1036   RBC 2.81 (L) 12/04/2023 1036   HGB 9.5 (L) 12/04/2023 1036   HGB 9.0 (L) 02/08/2022 1459   HCT 30.7 (L) 12/04/2023 1036   HCT 27.0 (L) 02/08/2022 1459   PLT 132 (L) 12/04/2023 1036   PLT 79 (LL) 02/08/2022 1459   MCV 109.3 (H) 12/04/2023 1036   MCV 88 02/08/2022 1459   MCH 33.8 12/04/2023 1036   MCHC 30.9 12/04/2023 1036   RDW 22.6 (H) 12/04/2023 1036   RDW 23.4 (H) 02/08/2022 1459   LYMPHSABS 0.7 12/04/2023 1036   LYMPHSABS 1.2 02/08/2022 1459   MONOABS 0.4 12/04/2023 1036   EOSABS 0.1 12/04/2023 1036   EOSABS 0.1 02/08/2022 1459   BASOSABS 0.0 12/04/2023 1036   BASOSABS 0.0 02/08/2022 1459    CMP      Component Value Date/Time   NA 139 12/04/2023 1036   NA 136 03/01/2022 1242   K 3.4 (L) 12/04/2023 1036   CL 102 12/04/2023 1036   CO2 25  12/04/2023 1036   GLUCOSE 118 (H) 12/04/2023 1036   BUN 11 12/04/2023 1036   BUN 5 (L) 03/01/2022 1242   CREATININE 0.87 12/04/2023 1036   CALCIUM  8.7 (L) 12/04/2023 1036   PROT 6.3 (L) 12/04/2023 1036   PROT 6.7 03/01/2022 1242   ALBUMIN 2.8 (L) 12/04/2023 1036   ALBUMIN 3.6 (L) 03/01/2022 1242   AST 16 12/04/2023 1036   ALT 8 12/04/2023 1036   ALKPHOS 54 12/04/2023 1036   BILITOT 1.1 12/04/2023 1036   BILITOT 1.0 03/01/2022 1242   GFRNONAA  >60 12/04/2023 1036   GFRAA 103 05/31/2020 1033     Lab Results  Component Value Date   CEA1 1.1 11/13/2021   /  CEA  Date Value Ref Range Status  11/13/2021 1.1 0.0 - 4.7 ng/mL Final    Comment:    (NOTE)                             Nonsmokers          <3.9                             Smokers             <5.6 [employer] Electrochemiluminescence Immunoassay (ECLIA) Values obtained with different assay methods or kits cannot be used interchangeably.  Results cannot be interpreted as absolute evidence of the presence or absence of malignant disease. Performed At: [facility] [address] [place], [name] [id number] Jennette Shorter MD Ey:[id number]    No results found for: PSA1 Lab Results  Component Value Date   RJW800 6 11/13/2021   Lab Results  Component Value Date   CAN125 219.0 (H) 11/13/2023    No results found for: TOTALPROTELP, ALBUMINELP, A1GS, A2GS, BETS, BETA2SER, GAMS, MSPIKE, SPEI Lab Results  Component Value Date   TIBC 189 (L) 11/06/2023   FERRITIN 271 11/06/2023   FERRITIN 356 (H) 02/07/2019   FERRITIN 311 (H) 02/06/2019   IRONPCTSAT 29 11/06/2023   Lab Results  Component Value Date   LDH 259 (H) 02/02/2019     STUDIES:   DG Abd Portable 1V-Small Bowel Obstruction Protocol-initial, 8 hr delay Result Date: 11/05/2023 CLINICAL DATA:  Small-bowel obstruction.  8 hour delayed image EXAM: PORTABLE ABDOMEN - 1 VIEW COMPARISON:  7:24 a.m. FINDINGS: Nasogastric tube tip overlies the gastric fundus. Administered oral contrast is now seen throughout a nondistended colon. Multiple gas-filled dilated loops of small bowel persist, however, the combination suggests an underlying distal partial small bowel obstruction or resolving complete small bowel obstruction. Cholelithiasis. Pulmonary hypoinflation. IMPRESSION: 1. Administered oral contrast is now seen throughout a nondistended colon. 2. Persistent dilated loops of small  bowel suggesting an underlying distal partial small bowel obstruction or resolving complete small bowel obstruction. Electronically Signed   By: Dorethia Molt M.D.   On: 11/05/2023 21:52   DG Abd 1 View Result Date: 11/05/2023 CLINICAL DATA:  Small bowel obstruction. EXAM: ABDOMEN - 1 VIEW COMPARISON:  11/04/2023 and CT abdomen pelvis 11/04/2023 FINDINGS: Nasogastric tube is in the stomach body region. Evidence for calcified gallstones. There continues to be dilated loops of bowel in the abdomen and no significant gas in the rectum. Limited evaluation for free air on this supine examination. IMPRESSION: 1. Persistent dilated loops of bowel in the abdomen. Findings remain compatible with small bowel obstruction. Minimal change from the CT topogram on 11/04/2023.  2. Nasogastric tube is in the stomach body region. 3. Cholelithiasis. Electronically Signed   By: Juliene Balder M.D.   On: 11/05/2023 11:36

## 2023-12-04 ENCOUNTER — Inpatient Hospital Stay

## 2023-12-04 ENCOUNTER — Inpatient Hospital Stay (HOSPITAL_BASED_OUTPATIENT_CLINIC_OR_DEPARTMENT_OTHER): Admitting: Hematology

## 2023-12-04 ENCOUNTER — Inpatient Hospital Stay: Attending: Gynecologic Oncology

## 2023-12-04 VITALS — BP 125/84 | HR 108 | Temp 98.2°F | Resp 20 | Wt 186.1 lb

## 2023-12-04 DIAGNOSIS — G629 Polyneuropathy, unspecified: Secondary | ICD-10-CM | POA: Insufficient documentation

## 2023-12-04 DIAGNOSIS — Z8052 Family history of malignant neoplasm of bladder: Secondary | ICD-10-CM | POA: Diagnosis not present

## 2023-12-04 DIAGNOSIS — C569 Malignant neoplasm of unspecified ovary: Secondary | ICD-10-CM

## 2023-12-04 DIAGNOSIS — Z79899 Other long term (current) drug therapy: Secondary | ICD-10-CM | POA: Diagnosis not present

## 2023-12-04 DIAGNOSIS — Z86711 Personal history of pulmonary embolism: Secondary | ICD-10-CM | POA: Insufficient documentation

## 2023-12-04 DIAGNOSIS — Z803 Family history of malignant neoplasm of breast: Secondary | ICD-10-CM | POA: Insufficient documentation

## 2023-12-04 DIAGNOSIS — C482 Malignant neoplasm of peritoneum, unspecified: Secondary | ICD-10-CM | POA: Diagnosis present

## 2023-12-04 DIAGNOSIS — Z7982 Long term (current) use of aspirin: Secondary | ICD-10-CM | POA: Insufficient documentation

## 2023-12-04 DIAGNOSIS — E876 Hypokalemia: Secondary | ICD-10-CM | POA: Diagnosis not present

## 2023-12-04 LAB — CBC WITH DIFFERENTIAL/PLATELET
Abs Immature Granulocytes: 0.02 10*3/uL (ref 0.00–0.07)
Basophils Absolute: 0 10*3/uL (ref 0.0–0.1)
Basophils Relative: 1 %
Eosinophils Absolute: 0.1 10*3/uL (ref 0.0–0.5)
Eosinophils Relative: 2 %
HCT: 30.7 % — ABNORMAL LOW (ref 36.0–46.0)
Hemoglobin: 9.5 g/dL — ABNORMAL LOW (ref 12.0–15.0)
Immature Granulocytes: 1 %
Lymphocytes Relative: 20 %
Lymphs Abs: 0.7 10*3/uL (ref 0.7–4.0)
MCH: 33.8 pg (ref 26.0–34.0)
MCHC: 30.9 g/dL (ref 30.0–36.0)
MCV: 109.3 fL — ABNORMAL HIGH (ref 80.0–100.0)
Monocytes Absolute: 0.4 10*3/uL (ref 0.1–1.0)
Monocytes Relative: 11 %
Neutro Abs: 2.4 10*3/uL (ref 1.7–7.7)
Neutrophils Relative %: 65 %
Platelets: 132 10*3/uL — ABNORMAL LOW (ref 150–400)
RBC: 2.81 MIL/uL — ABNORMAL LOW (ref 3.87–5.11)
RDW: 22.6 % — ABNORMAL HIGH (ref 11.5–15.5)
WBC: 3.7 10*3/uL — ABNORMAL LOW (ref 4.0–10.5)
nRBC: 0.8 % — ABNORMAL HIGH (ref 0.0–0.2)

## 2023-12-04 LAB — COMPREHENSIVE METABOLIC PANEL WITH GFR
ALT: 8 U/L (ref 0–44)
AST: 16 U/L (ref 15–41)
Albumin: 2.8 g/dL — ABNORMAL LOW (ref 3.5–5.0)
Alkaline Phosphatase: 54 U/L (ref 38–126)
Anion gap: 12 (ref 5–15)
BUN: 11 mg/dL (ref 6–20)
CO2: 25 mmol/L (ref 22–32)
Calcium: 8.7 mg/dL — ABNORMAL LOW (ref 8.9–10.3)
Chloride: 102 mmol/L (ref 98–111)
Creatinine, Ser: 0.87 mg/dL (ref 0.44–1.00)
GFR, Estimated: >60 mL/min (ref >60)
Glucose, Bld: 118 mg/dL — ABNORMAL HIGH (ref 70–99)
Potassium: 3.4 mmol/L — ABNORMAL LOW (ref 3.5–5.1)
Sodium: 139 mmol/L (ref 135–145)
Total Bilirubin: 1.1 mg/dL (ref 0.0–1.2)
Total Protein: 6.3 g/dL — ABNORMAL LOW (ref 6.5–8.1)

## 2023-12-04 LAB — MAGNESIUM: Magnesium: 1.6 mg/dL — ABNORMAL LOW (ref 1.7–2.4)

## 2023-12-04 NOTE — Progress Notes (Signed)
Patient is taking Lynparza as prescribed.  She has not missed any doses and reports no side effects at this time.   

## 2023-12-04 NOTE — Patient Instructions (Signed)
 [facility] at [facility] Discharge Instructions   You were seen and examined today by Dr. Rogers.  He reviewed the results of your lab work which are normal/stable.   Continue Lynparza  as prescribed.   We will see you back in .   Return as scheduled.    Thank you for choosing [facility] at [facility] to provide your oncology and hematology care.  To afford each patient quality time with our provider, please arrive at least 15 minutes before your scheduled appointment time.   If you have a lab appointment with the Cancer Center please come in thru the Main Entrance and check in at the main information desk.  You need to re-schedule your appointment should you arrive 10 or more minutes late.  We strive to give you quality time with our providers, and arriving late affects you and other patients whose appointments are after yours.  Also, if you no show three or more times for appointments you may be dismissed from the clinic at the providers discretion.     Again, thank you for choosing [facility].  Our hope is that these requests will decrease the amount of time that you wait before being seen by our physicians.       _____________________________________________________________  Should you have questions after your visit to [facility], please contact our office at [phone] and follow the prompts.  Our office hours are 8:00 a.m. and 4:30 p.m. Monday - Friday.  Please note that voicemails left after 4:00 p.m. may not be returned until the following business day.  We are closed weekends and major holidays.  You do have access to a nurse 24-7, just call the main number to the clinic [phone] and do not press any options, hold on the line and a nurse will answer the phone.    For prescription refill requests, have your pharmacy contact our office and allow 72 hours.    Due to Covid, you will need to wear a  mask upon entering the hospital. If you do not have a mask, a mask will be given to you at the Main Entrance upon arrival. For doctor visits, patients may have 1 support person age 71 or older with them. For treatment visits, patients can not have anyone with them due to social distancing guidelines and our immunocompromised population.

## 2023-12-05 LAB — CA 125: Cancer Antigen (CA) 125: 165 U/mL — ABNORMAL HIGH (ref 0.0–38.1)

## 2023-12-09 ENCOUNTER — Encounter: Payer: Self-pay | Admitting: Gynecologic Oncology

## 2023-12-09 ENCOUNTER — Other Ambulatory Visit: Payer: Self-pay | Admitting: *Deleted

## 2023-12-09 ENCOUNTER — Encounter: Payer: Self-pay | Admitting: Internal Medicine

## 2023-12-09 ENCOUNTER — Telehealth: Payer: Self-pay | Admitting: *Deleted

## 2023-12-09 NOTE — Telephone Encounter (Signed)
 Attempted to contact patient regarding CT results and recommendations from Dr. Rogers to stop ASA and restart Eliquis .  Multiple messages left for callback.  Will continue to reach out.

## 2023-12-11 ENCOUNTER — Telehealth: Payer: Self-pay | Admitting: *Deleted

## 2023-12-11 ENCOUNTER — Encounter: Payer: Self-pay | Admitting: Hematology

## 2023-12-11 ENCOUNTER — Telehealth: Payer: Self-pay | Admitting: Oncology

## 2023-12-11 NOTE — Progress Notes (Signed)
 CT of the neck reviewed. Dr. Lonn with Medical Oncology was informed of the results on 12/08/2023. She reached out to Dr. Charmain office for patient to have follow up. Patient is currently taking Eliquis . Dr. Charmain office to arrange follow up with the patient.

## 2023-12-11 NOTE — Telephone Encounter (Signed)
 CT results discussed with patient per Dr. Rogers.  Patient states that she has gone back on Eliquis  and has stopped ASA.

## 2023-12-11 NOTE — Telephone Encounter (Signed)
 Called Nicole Santos and let her know that Dr. Charmain office has been trying to reach her regarding her CT results.  Nicole Santos said that she has not been feeling well and has been throwing up.  She will call them back when she can.  She also said she had started Eliquis  per Dr. Charmain instruction.

## 2023-12-11 NOTE — Telephone Encounter (Signed)
 Have attempted multiple times to contact patient regarding CT results.  Unable to leave message on numbers provided.  Will continue to reach out.

## 2023-12-16 ENCOUNTER — Telehealth: Payer: Self-pay | Admitting: Oncology

## 2023-12-16 NOTE — Telephone Encounter (Signed)
 Called Nicole Santos regarding the referral for gastroenterology.  She has received the phone calls and letters from [facility] and states she needs to see someone first about her stomach issues before having a colonoscopy.  Advised her that she can let them know she does not want a colonoscopy now but does want to discuss her stomach issues.  Nicole Santos said she will call to schedule an appointment.

## 2023-12-24 ENCOUNTER — Other Ambulatory Visit: Payer: Self-pay | Admitting: Hematology

## 2023-12-25 ENCOUNTER — Inpatient Hospital Stay

## 2023-12-25 DIAGNOSIS — C482 Malignant neoplasm of peritoneum, unspecified: Secondary | ICD-10-CM | POA: Diagnosis not present

## 2023-12-25 LAB — CBC WITH DIFFERENTIAL/PLATELET
Abs Immature Granulocytes: 0.03 K/uL (ref 0.00–0.07)
Basophils Absolute: 0 K/uL (ref 0.0–0.1)
Basophils Relative: 1 %
Eosinophils Absolute: 0.1 K/uL (ref 0.0–0.5)
Eosinophils Relative: 3 %
HCT: 29.9 % — ABNORMAL LOW (ref 36.0–46.0)
Hemoglobin: 9.8 g/dL — ABNORMAL LOW (ref 12.0–15.0)
Immature Granulocytes: 1 %
Lymphocytes Relative: 19 %
Lymphs Abs: 1 K/uL (ref 0.7–4.0)
MCH: 36.6 pg — ABNORMAL HIGH (ref 26.0–34.0)
MCHC: 32.8 g/dL (ref 30.0–36.0)
MCV: 111.6 fL — ABNORMAL HIGH (ref 80.0–100.0)
Monocytes Absolute: 0.5 K/uL (ref 0.1–1.0)
Monocytes Relative: 9 %
Neutro Abs: 3.5 K/uL (ref 1.7–7.7)
Neutrophils Relative %: 67 %
Platelets: 141 K/uL — ABNORMAL LOW (ref 150–400)
RBC: 2.68 MIL/uL — ABNORMAL LOW (ref 3.87–5.11)
RDW: 24.2 % — ABNORMAL HIGH (ref 11.5–15.5)
WBC: 5.1 K/uL (ref 4.0–10.5)
nRBC: 1.2 % — ABNORMAL HIGH (ref 0.0–0.2)

## 2023-12-25 LAB — MAGNESIUM: Magnesium: 1.8 mg/dL (ref 1.7–2.4)

## 2023-12-25 LAB — COMPREHENSIVE METABOLIC PANEL WITH GFR
ALT: 10 U/L (ref 0–44)
AST: 18 U/L (ref 15–41)
Albumin: 3.1 g/dL — ABNORMAL LOW (ref 3.5–5.0)
Alkaline Phosphatase: 55 U/L (ref 38–126)
Anion gap: 12 (ref 5–15)
BUN: 13 mg/dL (ref 6–20)
CO2: 24 mmol/L (ref 22–32)
Calcium: 8.9 mg/dL (ref 8.9–10.3)
Chloride: 102 mmol/L (ref 98–111)
Creatinine, Ser: 0.85 mg/dL (ref 0.44–1.00)
GFR, Estimated: >60 mL/min (ref >60)
Glucose, Bld: 121 mg/dL — ABNORMAL HIGH (ref 70–99)
Potassium: 3.2 mmol/L — ABNORMAL LOW (ref 3.5–5.1)
Sodium: 138 mmol/L (ref 135–145)
Total Bilirubin: 1.2 mg/dL (ref 0.0–1.2)
Total Protein: 6.4 g/dL — ABNORMAL LOW (ref 6.5–8.1)

## 2023-12-26 LAB — CA 125: Cancer Antigen (CA) 125: 176 U/mL — ABNORMAL HIGH (ref 0.0–38.1)

## 2024-01-01 ENCOUNTER — Inpatient Hospital Stay (HOSPITAL_BASED_OUTPATIENT_CLINIC_OR_DEPARTMENT_OTHER): Admitting: Hematology

## 2024-01-01 VITALS — BP 110/56 | HR 106 | Temp 97.7°F | Resp 16 | Wt 177.7 lb

## 2024-01-01 DIAGNOSIS — C482 Malignant neoplasm of peritoneum, unspecified: Secondary | ICD-10-CM | POA: Diagnosis not present

## 2024-01-01 DIAGNOSIS — C569 Malignant neoplasm of unspecified ovary: Secondary | ICD-10-CM

## 2024-01-01 MED ORDER — POTASSIUM CHLORIDE CRYS ER 20 MEQ PO TBCR: 20.0000 meq | EXTENDED_RELEASE_TABLET | Freq: Every day | ORAL | 3 refills | Status: DC

## 2024-01-01 NOTE — Progress Notes (Unsigned)
Patient is taking Lynparza as prescribed.  She has not missed any doses and reports no side effects at this time.   

## 2024-01-01 NOTE — Progress Notes (Signed)
 [facility] [address] [name] [address]    Clinic Day:  01/02/2024  Referring physician: Jolinda Norene HERO, DO  Patient Care Team: Jolinda Norene HERO, DO as PCP - General (Family Medicine) Ladora Ross Lacy Phebe, MD as Referring Physician (Optometry) Rogers Hai, MD as Medical Oncologist (Medical Oncology) Celestia Joesph SQUIBB, RN as Oncology Nurse Navigator (Medical Oncology)   ASSESSMENT & PLAN:   Assessment: 1. High-grade serous carcinoma of peritoneum: - She had developed sharp left-sided abdominal pain since Mother's Day, occasional radiating to the back.  40 pound weight loss in the last 6 months. - Personal history of cervical cancer in 2000, status post chemo plus XRT at [facility].  She has slightly decreased hearing in the left ear since then. - US  abdomen (11/08/2021): Cholelithiasis with gallbladder wall thickening and sonographic Murphy sign.  Upper abdominal ascites. - Laparoscopy (11/13/2021): Starting of the omentum as well as the upper abdominal wall and diaphragm.  Liver was within normal limits.  6 L of ascites was evacuated.  Omental and peritoneal biopsies were sent.  Peritoneal implants throughout and a significant portion of omentum and on the small intestine. - CT CAP (11/13/2021): Ascites, extensive peritoneal thickening, nodularity and omental caking.  No evidence of primary malignancy in the chest, abdomen or pelvis.  Small left pleural effusion, nonspecific.  Cholelithiasis. - US  pelvis (11/14/2021): Uterus is grossly unremarkable.  Ovaries not visualized. - CA125 (11/13/2021): 1269 - Pathology: Peritoneum and omental biopsy consistent with high-grade serous carcinoma.  HER2 2+ by IHC and negative by FISH. - 4 cycles of carboplatin  and paclitaxel  from 11/21/2021 through 01/23/2022, cycle 9 on 08/29/2022. - 10/01/2022: Diagnostic laparoscopy, exploratory laparotomy, omentectomy - Pathology: Right pelvic sidewall excision-high-grade  serous carcinoma, omental resection-high-grade serous carcinoma with treatment effect present.  Small bowel implant excision was negative for carcinoma. - Germline mutation testing negative - Foundation 1 NGS (10/01/2022): LOH-18.9%, HRD +, MS-stable, TMB-low - Olaparib  300 mg twice daily and bevacizumab  maintenance started on 01/07/2023.  Lynparza  held when she was hospitalized from 11/04/2023 through 11/07/2023 with partial SBO.  Lynparza  restarted back on 12/03/2023.  Bevacizumab  on hold. - Ventana FOLR1: Negative (45% cells with 2+/3+ staining)  2. Social/family history: - Lives at home with her husband.  Worked in Engineering geologist at [employer].  Non-smoker. - Maternal grandmother with bladder cancer.  Maternal great grandmother with breast cancer.    Plan: High-grade serous carcinoma of peritoneum: - Hospitalized from 11/04/2023 through 11/07/2023 with partial small bowel obstruction (vomiting and abdominal pain), which resolved with conservative measures. - She is tolerating soft foods like mashed potatoes and soups very well.  Certain foods like broccoli and burgers are causing abdominal pain. - Restaging CT (10/24/2023): Posttreatment changes in the pelvis with no convincing evidence of active malignancy. - CTAP (11/04/2023): Suspected small bowel obstruction with narrowing/transition of the distal ileum, possibly on the basis of adhesions.  Vague 11 mm peritoneal nodule beneath right mid anterior abdominal wall equivocal. - She met with Dr. Viktoria on 11/22/2023.  Dr. Viktoria has recommended to continue Lynparza  rather than starting on chemotherapy as she is asymptomatic. - She has started back on Lynparza  300 mg twice daily and is tolerating reasonably well.  We are holding her bevacizumab . - She reported having abdominal pain occasionally depending on what she eats. - Reviewed labs today: LFTs are normal.  CBC grossly normal.  She may continue follow-up with general milligrams twice daily.  RTC 2 months for follow-up  with  CT CAP with contrast and CA125 levels.   2.  Peripheral neuropathy: - Continue Cymbalta  60 mg daily.  Neuropathy in the bottom of the feet left more than right is stable.  3.  Hypomagnesemia: - Continue magnesium  3 times daily.  Magnesium  is 1.8.  4.  Hypokalemia: -  Potassium is 3.2.  She was told to start back on K-Dur 20 milliequivalents daily.   5.  Subsegmental pulmonary embolism (Dx 03/06/2022): - During recent hospitalization Eliquis  was briefly held. - She had a CT soft tissue neck after visit with Dr. Viktoria when she had neck swelling.  This was done on 12/02/2023 which showed acute thrombosis in the left jugular vein, left subclavian vein, left retromandibular veins.  She just started back on Eliquis  prior to that CT scan.  The swelling and pain has decreased.  Continue Eliquis  twice daily.    Orders Placed This Encounter  Procedures   CT CHEST ABDOMEN PELVIS W CONTRAST    Standing Status:   Future    Expected Date:   03/03/2024    Expiration Date:   12/31/2024    If indicated for the ordered procedure, I authorize the administration of contrast media per Radiology protocol:   Yes    Does the patient have a contrast media/X-ray dye allergy?:   No    Preferred imaging location?:   [facility]    If indicated for the ordered procedure, I authorize the administration of oral contrast media per Radiology protocol:   Yes   CBC with Differential    Standing Status:   Future    Expected Date:   03/03/2024    Expiration Date:   06/01/2024   Comprehensive metabolic panel    Standing Status:   Future    Expected Date:   03/03/2024    Expiration Date:   06/01/2024   CA 125    Standing Status:   Future    Expected Date:   03/03/2024    Expiration Date:   06/01/2024      [name],acting as a scribe for Alean Stands, MD.,have documented all relevant documentation on the behalf of Alean Stands, MD,as directed by  Alean Stands, MD while in the  presence of Alean Stands, MD.  I, Alean Stands MD, have reviewed the above documentation for accuracy and completeness, and I agree with the above.      Alean Stands, MD   7/31/20255:27 PM  CHIEF COMPLAINT:   Diagnosis: high-grade serous carcinoma of peritoneum    Cancer Staging  Primary peritoneal carcinomatosis [facility]) Staging form: Ovary, Fallopian Tube, and Primary Peritoneal Carcinoma, AJCC 8th Edition - Clinical stage from 11/17/2021: FIGO Stage IIIC (cT3c, cM0) - Unsigned    Prior Therapy: Carboplatin  and paclitaxel -- x 4 cycles 11/21/2021 - 01/23/2022, x5 cycles 06/05/22 - 08/29/22, x3 cycles 11/05/22 - 12/17/22   Current Therapy:  olaparib  and maintenance bevacizumab     HISTORY OF PRESENT ILLNESS:   Oncology History  Malignant neoplasm of ovary (HCC)  11/15/2021 Initial Diagnosis   Malignant neoplasm of ovary (HCC)   01/07/2023 -  Chemotherapy   Patient is on Treatment Plan : OVARIAN Bevacizumab  q21d     05/24/2023 Imaging   CT A/P 1. Similar slightly heterogeneous appearance of the uterus with tethered appearance of the terminal ileum and sigmoid colon to the no evidence of new or progressive disease in the abdomen or pelvis. 2. Cholelithiasis without findings of acute cholecystitis. 3.  Aortic Atherosclerosis (ICD10-I70.0).   08/20/2023 Imaging  CT A/P: 1. No metastatic disease identified within the abdomen or pelvis. 2. Redemonstration of heterogeneous appearance of the uterus and adhesion between the right fundal region of the uterus and terminal ileum without associated bowel obstruction. Findings favor posttreatment changes. 3. Stable 4. Multiple other nonacute observations, as described above.   10/24/2023 Imaging   CT A/P: Posttreatment changes in the pelvis with no convincing evidence for active malignancy within the abdomen or pelvis.     11/04/2023 Imaging   CT A/P: 1. Suspected small bowel obstruction with narrowing/transition of the  distal ileum, possibly on the basis of adhesions. 2. Small right paramidline periumbilical ventral hernia a loop of small bowel, now with interval fluid. Correlate for reducibility. 3. Vague 11 mm peritoneal nodule beneath the right mid anterior abdominal wall, equivocal. Attention on follow-up is suggested. 4. Cholelithiasis without associated inflammatory changes.   Primary peritoneal carcinomatosis (HCC)  11/13/2021 Imaging   CT A/P: 1. Trace ascites throughout the abdomen and pelvis, with extensive peritoneal thickening, nodularity, and omental caking. Findings are consistent with widespread peritoneal and omental metastatic disease. 2. No evidence of primary malignancy in the chest, abdomen, or pelvis, with specific attention to the most likely etiologies of peritoneal metastatic disease including pancreatic, ovarian, and endometrial malignancy. Although there is no obvious mass, pelvic ultrasound may be helpful to more closely assess the female reproductive organs. 3. Small left pleural effusion and associated atelectasis or consolidation, nonspecific although worrisome for malignant effusion. No overt pleural thickening or nodularity. 4. Scattered areas of nonspecific infectious or inflammatory alveolar ground-glass airspace opacity throughout the lungs. 5. Cholelithiasis without evidence of acute cholecystitis. 6. Nonobstructive right nephrolithiasis. 7. Postoperative findings of same day laparoscopy including minimal pneumoperitoneum.   Aortic Atherosclerosis (ICD10-I70.0).   11/13/2021 Surgery   Diagnostic laparoscopy with biopsy Dr. Mavis Findings: Significant bloody ascites, studding of the omentum as well as the upper abdominal wall and diaphragm.  Nodularity noted along the small intestine.  Omental disease inhibited view of the pelvis.   11/17/2021 Initial Diagnosis   Primary peritoneal carcinomatosis (HCC)   11/21/2021 - 01/25/2022 Chemotherapy   Patient is on  Treatment Plan : OVARIAN Carboplatin  (AUC 6) / Paclitaxel  (175) q21d x 6 cycles     11/21/2021 - 12/17/2022 Chemotherapy   Patient is on Treatment Plan : OVARIAN Carboplatin  (AUC 6) + Paclitaxel  (175)  + Mvasi  q21d X 6 Cycles     01/19/2022 Imaging   CT A/P: 1. Small amount of adherent thrombus or fibrin sheath around the catheter in the LEFT brachiocephalic vein. 2. Small low-attenuation area in the inter lobar fissure region associated with medial segment of LEFT hepatic lobe, new from previous imaging, may represent developing variable fat deposition though given history of peritoneal disease would suggest attention on subsequent imaging. 3. Diminished thickening of mesenteric reflections, decreased ascites and decreased omental infiltration since previous imaging. 4. Focal concentric narrowing of the rectosigmoid junction is nonspecific but suspicious for neoplasm potentially related to serosal involvement in the pelvis in this patient with known peritoneal disease from ovarian cancer. Underlying colonic neoplasm would be difficult to exclude as well. Colonic assessment with endoscopy or comparison with recent endoscopy performed is suggested. 5. Cholelithiasis without evidence of acute cholecystitis. 6. 4 mm RIGHT lower pole renal calculus.   Aortic Atherosclerosis (ICD10-I70.0).   03/06/2022 Imaging   CT C/A/P: Chest CT:   Scattered subsegmental pulmonary emboli in the right lung.   Abdominal CT:   1. Mild right hydroureteronephrosis from a 4 mm  UVJ calculus. 2. Unchanged omental disease. 3. Cholelithiasis and other chronic findings described above.   05/24/2022 Imaging   Increased peritoneal carcinomatosis and mild ascites.   New moderate distal small bowel obstruction, with transition point in the central pelvis adjacent to the uterine fundus, which may be due to peritoneal carcinomatosis or adhesion.   Cholelithiasis. No radiographic evidence of cholecystitis.    Aortic Atherosclerosis (ICD10-I70.0).   08/21/2022 Imaging   CT A/P: 1. Improved peritoneal carcinomatosis and mild ascites. 2. Prominent/mildly enlarged loops of small bowel throughout the abdomen with transition point in the pelvis, not significantly changed from prior and compatible with a least low-grade obstruction. 3. Cholelithiasis without findings of acute cholecystitis.   10/01/2022 Surgery   Diagnostic laparoscopy, robotic assisted laparoscopic lysis of adhesions, exploratory laparotomy, omentectomy   Findings: On EUA, small uterus within minimal mobility. On intra-abdominal entry, omentum adherent to the anterior abdominal wall at the level of the transverse colon. Some adhesions noted between the anterior liver surface and the diaphragm on the right. Small miliary disease noted on bilateral diaphragms, small bowel, small bowel mesentery, abdominal and pelvic peritoneum. No ascites. Complete retroperitonealization of the pelvic organs. Multiple loops of small bowel were densely adherent to the uterine fundus without obvious obstruction. Terminal ileum and cecum also adherent along the right pelvic sidewall. Sigmoid colon densely adherent to a loop of ileum and to the lateral aspect of the retroperitonealized pelvis on the left. After opening the retroperitoneum bilaterally and transecting the round ligaments, the bladder flap was able to be partially developed anteriorly. This revealed dense adhesions of the sigmoid to the lateral uterus. Given prior discussions with the patient, I did not feel that proceeding with further attempt at lysis of adhesions (some mobilization of two loops of ileum adherent to the uterine fundus was performed) would be possible without significant risk of injury to multiple loops of small bowel and to the sigmoid colon. 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 On laparotomy, 8 x 4 cm omental cake noted. No  disease visualized or palpated within the lesser sac. There was no palpable disease along the liver edges or spleen. Stomach was normal in appearance. Some miliary disease palpated along the abdominal peritoneum. Numerous miliary disease noted along the small bowel and small bowel mesentery. Numerous prominent lymph nodes (1cm or less) within the stalk of the small bowel mesentery.  R1 resection based on miliary disease (in the setting of incomplete pelvic evaluation).   10/01/2022 Pathology Results   A. Pelvic sidewall, right, excision - High-grade serous carcinoma   B. Omentum, resection - High-grade serous carcinoma - Treatment effect present - See comment   C. Small bowel, implant, excision - Infarcted epiploic appendage - Negative for carcinoma  Immunohistochemical stains were performed on block B1. The tumor cells are positive for WT1, compatible with tubo-ovarian or peritoneal origin. p53 demonstrates faint positivity in rare cells, favored to be most consistent with a null pattern.    11/05/2022 Genetic Testing   Negative genetic testing on the [facility] Multi-cancer + RNA panel.  The report date is November 05, 2022.  The Multi-Cancer + RNA Panel offered by [facility] includes sequencing and/or deletion/duplication analysis of the following 70 genes:  AIP*, ALK, APC*, ATM*, AXIN2*, BAP1*, BARD1*, BLM*, BMPR1A*, BRCA1*, BRCA2*, BRIP1*, CDC73*, CDH1*, CDK4, CDKN1B*, CDKN2A, CHEK2*, CTNNA1*, DICER1*, EPCAM (del/dup only), EGFR, FH*, FLCN*, GREM1 (promoter dup only), HOXB13, KIT, LZTR1, MAX*, MBD4, MEN1*, MET, MITF, MLH1*, MSH2*, MSH3*, MSH6*, MUTYH*,  NF1*, NF2*, NTHL1*, PALB2*, PDGFRA, PMS2*, POLD1*, POLE*, POT1*, PRKAR1A*, PTCH1*, PTEN*, RAD51C*, RAD51D*, RB1*, RET, SDHA* (sequencing only), SDHAF2*, SDHB*, SDHC*, SDHD*, SMAD4*, SMARCA4*, SMARCB1*, SMARCE1*, STK11*, SUFU*, TMEM127*, TP53*, TSC1*, TSC2*, VHL*. RNA analysis is performed for * genes.    01/07/2023 -  Chemotherapy   Patient is on  Treatment Plan : OVARIAN Bevacizumab  q21d     08/20/2023 Imaging   CT A/P: 1. No metastatic disease identified within the abdomen or pelvis. 2. Redemonstration of heterogeneous appearance of the uterus and adhesion between the right fundal region of the uterus and terminal ileum without associated bowel obstruction. Findings favor posttreatment changes. 3. Stable 4. Multiple other nonacute observations, as described above.   10/24/2023 Imaging   CT A/P: Posttreatment changes in the pelvis with no convincing evidence for active malignancy within the abdomen or pelvis.     11/04/2023 Imaging   CT A/P: 1. Suspected small bowel obstruction with narrowing/transition of the distal ileum, possibly on the basis of adhesions. 2. Small right paramidline periumbilical ventral hernia a loop of small bowel, now with interval fluid. Correlate for reducibility. 3. Vague 11 mm peritoneal nodule beneath the right mid anterior abdominal wall, equivocal. Attention on follow-up is suggested. 4. Cholelithiasis without associated inflammatory changes.      INTERVAL HISTORY:   Isys is a 53 y.o. female presenting to clinic today for follow up of high-grade serous carcinoma of peritoneum. She was last seen by me on 12/04/2023.  Today, she states that she is doing well overall. Her appetite level is at 20%. Her energy level is at 20%.  PAST MEDICAL HISTORY:   Past Medical History: Past Medical History:  Diagnosis Date   Acute pulmonary embolism (HCC) 03/06/2022   Acute respiratory disease due to COVID-19 virus 02/02/2019   Anxiety    Cancer (HCC)    Depression    Dyspnea    Dysrhythmia    Family history of bladder cancer    Family history of breast cancer    History of diabetes mellitus    Resolved in Sept 2023 with weight loss.   Hypertension    Ovarian cancer (HCC) 11/14/2021   Peritoneal carcinomatosis (HCC)    Pneumonia    Sleep apnea    doesn't use CPAP   Tachycardia     Surgical  History: Past Surgical History:  Procedure Laterality Date   KNEE SURGERY Right    Arthroscopic   LAPAROSCOPIC ABDOMINAL EXPLORATION N/A 11/13/2021   Procedure: Exploratory laparoscopy;  Surgeon: Mavis Anes, MD;  Location: [facility];  Service: General;  Laterality: N/A;   PORTACATH PLACEMENT Left 11/15/2021   Procedure: INSERTION PORT-A-CATH;  Surgeon: Mavis Anes, MD;  Location: [facility];  Service: General;  Laterality: Left;   TONSILLECTOMY AND ADENOIDECTOMY      Social History: Social History   Socioeconomic History   Marital status: Married    Spouse name: Not on file   Number of children: Not on file   Years of education: Not on file   Highest education level: Not on file  Occupational History   Occupation: Unemployed  Tobacco Use   Smoking status: Never   Smokeless tobacco: Never  Vaping Use   Vaping status: Never Used  Substance and Sexual Activity   Alcohol use: No   Drug use: No   Sexual activity: Not Currently  Other Topics Concern   Not on file  Social History Narrative   Not on file   Social Drivers of Corporate investment banker  Strain: Not on file  Food Insecurity: No Food Insecurity (11/04/2023)   Hunger Vital Sign    Worried About Running Out of Food in the Last Year: Never true    Ran Out of Food in the Last Year: Never true  Transportation Needs: No Transportation Needs (11/04/2023)   PRAPARE - Administrator, Civil Service (Medical): No    Lack of Transportation (Non-Medical): No  Physical Activity: Not on file  Stress: Not on file  Social Connections: Not on file  Intimate Partner Violence: Not At Risk (11/04/2023)   Humiliation, Afraid, Rape, and Kick questionnaire    Fear of Current or Ex-Partner: No    Emotionally Abused: No    Physically Abused: No    Sexually Abused: No    Family History: Family History  Problem Relation Age of Onset   Hypertension Mother    Heart attack Paternal Uncle    Bladder Cancer Maternal Grandmother  54       non-smoker   Dementia Maternal Grandfather    Breast cancer Maternal Great-grandmother        MGF's mother   Colon cancer Neg Hx    Ovarian cancer Neg Hx    Endometrial cancer Neg Hx    Pancreatic cancer Neg Hx    Prostate cancer Neg Hx     Current Medications:  Current Outpatient Medications:    atorvastatin  (LIPITOR) 20 MG tablet, Take 1 tablet (20 mg total) by mouth at bedtime., Disp: 90 tablet, Rfl: 3   diphenoxylate -atropine  (LOMOTIL ) 2.5-0.025 MG tablet, Take 2 tablets by mouth after the first watery bowel movement. May then take 1 pill by mouth after each watery bowel movement. Do not exceed 8 pills in a 24 hour period., Disp: 60 tablet, Rfl: 3   DULoxetine  (CYMBALTA ) 30 MG capsule, Take 2 capsules (60 mg total) by mouth daily., Disp: , Rfl:    ELIQUIS  5 MG TABS tablet, Take 5 mg by mouth 2 (two) times daily., Disp: , Rfl:    lidocaine -prilocaine  (EMLA ) cream, Apply a quarter sized amount to port a cath site and cover with plastic wrap 1 hour prior to infusion appointments, Disp: 30 g, Rfl: 3   LYNPARZA  150 MG tablet, TAKE 2 TABLETS TWICE A DAY. MAY TAKE WITH FOOD TO DECEASE NAUSEA AND VOMITING. (SWALLOW WHOLE), Disp: 360 tablet, Rfl: 1   magnesium  oxide (MAG-OX) 400 (240 Mg) MG tablet, TAKE 1 TABLET BY MOUTH TWICE A DAY, Disp: 180 tablet, Rfl: 1   Multiple Vitamin (MULTIVITAMIN) capsule, Take 1 capsule by mouth daily., Disp: , Rfl:    ondansetron  (ZOFRAN ) 4 MG tablet, Take 1 tablet (4 mg total) by mouth every 6 (six) hours as needed for nausea., Disp: 20 tablet, Rfl: 0   polyethylene glycol (MIRALAX  / GLYCOLAX ) 17 g packet, Take 17 g by mouth daily., Disp: 30 each, Rfl: 3   potassium chloride  SA (KLOR-CON  M) 20 MEQ tablet, Take 1 tablet (20 mEq total) by mouth daily., Disp: 90 tablet, Rfl: 3 No current facility-administered medications for this visit.  Facility-Administered Medications Ordered in Other Visits:    heparin  lock flush 100 unit/mL, 500 Units, Intravenous,  Once, Rogers Hai, MD   sodium chloride  flush (NS) 0.9 % injection 10 mL, 10 mL, Intravenous, PRN, Joey Hudock, MD   sodium chloride  flush (NS) 0.9 % injection 10 mL, 10 mL, Intracatheter, PRN, Maleya Leever, MD, 10 mL at 10/14/23 1610   Allergies: Allergies  Allergen Reactions   Augmentin  [Amoxicillin -Pot Clavulanate] Diarrhea  and Nausea And Vomiting   Doxycycline     Abdominal cramps.   Topamax  [Topiramate ] Nausea And Vomiting    REVIEW OF SYSTEMS:   Review of Systems  Constitutional:  Negative for chills, fatigue and fever.  HENT:   Negative for lump/mass, mouth sores, nosebleeds, sore throat and trouble swallowing.   Eyes:  Negative for eye problems.  Respiratory:  Positive for shortness of breath. Negative for cough.   Cardiovascular:  Negative for chest pain, leg swelling and palpitations.  Gastrointestinal:  Positive for vomiting. Negative for abdominal pain, constipation, diarrhea and nausea.  Genitourinary:  Negative for bladder incontinence, difficulty urinating, dysuria, frequency, hematuria and nocturia.   Musculoskeletal:  Negative for arthralgias, back pain, flank pain, myalgias and neck pain.  Skin:  Negative for itching and rash.  Neurological:  Positive for dizziness and numbness. Negative for headaches.  Hematological:  Does not bruise/bleed easily.  Psychiatric/Behavioral:  Negative for depression, sleep disturbance and suicidal ideas. The patient is not nervous/anxious.   All other systems reviewed and are negative.    VITALS:   Blood pressure (!) 110/56, pulse (!) 106, temperature 97.7 F (36.5 C), temperature source Oral, resp. rate 16, weight 177 lb 11.1 oz (80.6 kg), SpO2 100%.  Wt Readings from Last 3 Encounters:  01/01/24 177 lb 11.1 oz (80.6 kg)  12/04/23 186 lb 1.1 oz (84.4 kg)  11/22/23 189 lb 6.4 oz (85.9 kg)    Body mass index is 32.5 kg/m.  Performance status (ECOG): 1 - Symptomatic but completely  ambulatory  PHYSICAL EXAM:   Physical Exam Vitals and nursing note reviewed. Exam conducted with a chaperone present.  Constitutional:      Appearance: Normal appearance.  Cardiovascular:     Rate and Rhythm: Normal rate and regular rhythm.     Pulses: Normal pulses.     Heart sounds: Normal heart sounds.  Pulmonary:     Effort: Pulmonary effort is normal.     Breath sounds: Normal breath sounds.  Abdominal:     Palpations: Abdomen is soft. There is no hepatomegaly, splenomegaly or mass.     Tenderness: There is no abdominal tenderness.  Musculoskeletal:     Right lower leg: No edema.     Left lower leg: No edema.  Lymphadenopathy:     Cervical: No cervical adenopathy.     Right cervical: No superficial, deep or posterior cervical adenopathy.    Left cervical: No superficial, deep or posterior cervical adenopathy.     Upper Body:     Right upper body: No supraclavicular or axillary adenopathy.     Left upper body: No supraclavicular or axillary adenopathy.  Neurological:     General: No focal deficit present.     Mental Status: She is alert and oriented to person, place, and time.  Psychiatric:        Mood and Affect: Mood normal.        Behavior: Behavior normal.     LABS:   CBC     Component Value Date/Time   WBC 5.1 12/25/2023 1242   RBC 2.68 (L) 12/25/2023 1242   HGB 9.8 (L) 12/25/2023 1242   HGB 9.0 (L) 02/08/2022 1459   HCT 29.9 (L) 12/25/2023 1242   HCT 27.0 (L) 02/08/2022 1459   PLT 141 (L) 12/25/2023 1242   PLT 79 (LL) 02/08/2022 1459   MCV 111.6 (H) 12/25/2023 1242   MCV 88 02/08/2022 1459   MCH 36.6 (H) 12/25/2023 1242   MCHC 32.8 12/25/2023  1242   RDW 24.2 (H) 12/25/2023 1242   RDW 23.4 (H) 02/08/2022 1459   LYMPHSABS 1.0 12/25/2023 1242   LYMPHSABS 1.2 02/08/2022 1459   MONOABS 0.5 12/25/2023 1242   EOSABS 0.1 12/25/2023 1242   EOSABS 0.1 02/08/2022 1459   BASOSABS 0.0 12/25/2023 1242   BASOSABS 0.0 02/08/2022 1459    CMP       Component Value Date/Time   NA 138 12/25/2023 1242   NA 136 03/01/2022 1242   K 3.2 (L) 12/25/2023 1242   CL 102 12/25/2023 1242   CO2 24 12/25/2023 1242   GLUCOSE 121 (H) 12/25/2023 1242   BUN 13 12/25/2023 1242   BUN 5 (L) 03/01/2022 1242   CREATININE 0.85 12/25/2023 1242   CALCIUM  8.9 12/25/2023 1242   PROT 6.4 (L) 12/25/2023 1242   PROT 6.7 03/01/2022 1242   ALBUMIN 3.1 (L) 12/25/2023 1242   ALBUMIN 3.6 (L) 03/01/2022 1242   AST 18 12/25/2023 1242   ALT 10 12/25/2023 1242   ALKPHOS 55 12/25/2023 1242   BILITOT 1.2 12/25/2023 1242   BILITOT 1.0 03/01/2022 1242   GFRNONAA >60 12/25/2023 1242   GFRAA 103 05/31/2020 1033     Lab Results  Component Value Date   CEA1 1.1 11/13/2021   /  CEA  Date Value Ref Range Status  11/13/2021 1.1 0.0 - 4.7 ng/mL Final    Comment:    (NOTE)                             Nonsmokers          <3.9                             Smokers             <5.6 [employer] Electrochemiluminescence Immunoassay (ECLIA) Values obtained with different assay methods or kits cannot be used interchangeably.  Results cannot be interpreted as absolute evidence of the presence or absence of malignant disease. Performed At: [facility] [address] [place], [name] [id number] Jennette Shorter MD Ey:[phone]    No results found for: PSA1 Lab Results  Component Value Date   RJW800 6 11/13/2021   Lab Results  Component Value Date   CAN125 176.0 (H) 12/25/2023    No results found for: TOTALPROTELP, ALBUMINELP, A1GS, A2GS, BETS, BETA2SER, GAMS, MSPIKE, SPEI Lab Results  Component Value Date   TIBC 189 (L) 11/06/2023   FERRITIN 271 11/06/2023   FERRITIN 356 (H) 02/07/2019   FERRITIN 311 (H) 02/06/2019   IRONPCTSAT 29 11/06/2023   Lab Results  Component Value Date   LDH 259 (H) 02/02/2019     STUDIES:   No results found.

## 2024-01-01 NOTE — Patient Instructions (Addendum)
 [facility] at [facility] Discharge Instructions   You were seen and examined today by Dr. Rogers.  He reviewed the results of your lab work which are normal/stable.   Continue Lynparza  as prescribed.   We will see you back in 2 months. We will repeat lab work and a scan prior to this visit.    Return as scheduled.    Thank you for choosing [facility] at [facility] to provide your oncology and hematology care.  To afford each patient quality time with our provider, please arrive at least 15 minutes before your scheduled appointment time.   If you have a lab appointment with the Cancer Center please come in thru the Main Entrance and check in at the main information desk.  You need to re-schedule your appointment should you arrive 10 or more minutes late.  We strive to give you quality time with our providers, and arriving late affects you and other patients whose appointments are after yours.  Also, if you no show three or more times for appointments you may be dismissed from the clinic at the providers discretion.     Again, thank you for choosing [facility].  Our hope is that these requests will decrease the amount of time that you wait before being seen by our physicians.       _____________________________________________________________  Should you have questions after your visit to [facility], please contact our office at [phone] and follow the prompts.  Our office hours are 8:00 a.m. and 4:30 p.m. Monday - Friday.  Please note that voicemails left after 4:00 p.m. may not be returned until the following business day.  We are closed weekends and major holidays.  You do have access to a nurse 24-7, just call the main number to the clinic [phone] and do not press any options, hold on the line and a nurse will answer the phone.    For prescription refill requests, have your pharmacy contact our  office and allow 72 hours.    Due to Covid, you will need to wear a mask upon entering the hospital. If you do not have a mask, a mask will be given to you at the Main Entrance upon arrival. For doctor visits, patients may have 1 support person age 63 or older with them. For treatment visits, patients can not have anyone with them due to social distancing guidelines and our immunocompromised population.

## 2024-01-02 ENCOUNTER — Encounter: Payer: Self-pay | Admitting: Hematology

## 2024-01-04 ENCOUNTER — Encounter: Payer: Self-pay | Admitting: Oncology

## 2024-01-06 ENCOUNTER — Encounter (HOSPITAL_COMMUNITY): Payer: Self-pay | Admitting: *Deleted

## 2024-01-06 ENCOUNTER — Other Ambulatory Visit: Payer: Self-pay

## 2024-01-06 ENCOUNTER — Emergency Department (HOSPITAL_COMMUNITY)

## 2024-01-06 ENCOUNTER — Inpatient Hospital Stay (HOSPITAL_COMMUNITY)
Admission: EM | Admit: 2024-01-06 | Discharge: 2024-01-23 | DRG: 330 | Disposition: A | Discharge status: Hospice | Attending: Internal Medicine | Admitting: Internal Medicine

## 2024-01-06 DIAGNOSIS — E785 Hyperlipidemia, unspecified: Secondary | ICD-10-CM | POA: Diagnosis present

## 2024-01-06 DIAGNOSIS — C482 Malignant neoplasm of peritoneum, unspecified: Secondary | ICD-10-CM | POA: Diagnosis present

## 2024-01-06 DIAGNOSIS — F419 Anxiety disorder, unspecified: Secondary | ICD-10-CM | POA: Diagnosis present

## 2024-01-06 DIAGNOSIS — Z881 Allergy status to other antibiotic agents status: Secondary | ICD-10-CM

## 2024-01-06 DIAGNOSIS — K439 Ventral hernia without obstruction or gangrene: Secondary | ICD-10-CM | POA: Diagnosis present

## 2024-01-06 DIAGNOSIS — C786 Secondary malignant neoplasm of retroperitoneum and peritoneum: Principal | ICD-10-CM | POA: Diagnosis present

## 2024-01-06 DIAGNOSIS — E66811 Obesity, class 1: Secondary | ICD-10-CM | POA: Diagnosis present

## 2024-01-06 DIAGNOSIS — Z8616 Personal history of COVID-19: Secondary | ICD-10-CM

## 2024-01-06 DIAGNOSIS — R180 Malignant ascites: Secondary | ICD-10-CM | POA: Diagnosis not present

## 2024-01-06 DIAGNOSIS — R319 Hematuria, unspecified: Secondary | ICD-10-CM | POA: Diagnosis not present

## 2024-01-06 DIAGNOSIS — K56609 Unspecified intestinal obstruction, unspecified as to partial versus complete obstruction: Secondary | ICD-10-CM | POA: Diagnosis not present

## 2024-01-06 DIAGNOSIS — Z86711 Personal history of pulmonary embolism: Secondary | ICD-10-CM

## 2024-01-06 DIAGNOSIS — R000 Tachycardia, unspecified: Secondary | ICD-10-CM | POA: Diagnosis present

## 2024-01-06 DIAGNOSIS — G8929 Other chronic pain: Secondary | ICD-10-CM | POA: Diagnosis present

## 2024-01-06 DIAGNOSIS — E871 Hypo-osmolality and hyponatremia: Secondary | ICD-10-CM | POA: Diagnosis present

## 2024-01-06 DIAGNOSIS — R109 Unspecified abdominal pain: Secondary | ICD-10-CM | POA: Diagnosis not present

## 2024-01-06 DIAGNOSIS — E11649 Type 2 diabetes mellitus with hypoglycemia without coma: Secondary | ICD-10-CM | POA: Diagnosis not present

## 2024-01-06 DIAGNOSIS — Z8541 Personal history of malignant neoplasm of cervix uteri: Secondary | ICD-10-CM

## 2024-01-06 DIAGNOSIS — Z7189 Other specified counseling: Secondary | ICD-10-CM

## 2024-01-06 DIAGNOSIS — Z8249 Family history of ischemic heart disease and other diseases of the circulatory system: Secondary | ICD-10-CM

## 2024-01-06 DIAGNOSIS — I2782 Chronic pulmonary embolism: Secondary | ICD-10-CM

## 2024-01-06 DIAGNOSIS — Z803 Family history of malignant neoplasm of breast: Secondary | ICD-10-CM

## 2024-01-06 DIAGNOSIS — I10 Essential (primary) hypertension: Secondary | ICD-10-CM | POA: Diagnosis not present

## 2024-01-06 DIAGNOSIS — C569 Malignant neoplasm of unspecified ovary: Secondary | ICD-10-CM | POA: Diagnosis present

## 2024-01-06 DIAGNOSIS — Z8052 Family history of malignant neoplasm of bladder: Secondary | ICD-10-CM

## 2024-01-06 DIAGNOSIS — K56699 Other intestinal obstruction unspecified as to partial versus complete obstruction: Secondary | ICD-10-CM | POA: Diagnosis present

## 2024-01-06 DIAGNOSIS — Z79899 Other long term (current) drug therapy: Secondary | ICD-10-CM

## 2024-01-06 DIAGNOSIS — Z7901 Long term (current) use of anticoagulants: Secondary | ICD-10-CM

## 2024-01-06 DIAGNOSIS — D62 Acute posthemorrhagic anemia: Secondary | ICD-10-CM | POA: Diagnosis not present

## 2024-01-06 DIAGNOSIS — I2699 Other pulmonary embolism without acute cor pulmonale: Secondary | ICD-10-CM | POA: Diagnosis present

## 2024-01-06 DIAGNOSIS — D696 Thrombocytopenia, unspecified: Secondary | ICD-10-CM | POA: Diagnosis present

## 2024-01-06 DIAGNOSIS — Z6832 Body mass index (BMI) 32.0-32.9, adult: Secondary | ICD-10-CM

## 2024-01-06 DIAGNOSIS — D630 Anemia in neoplastic disease: Secondary | ICD-10-CM | POA: Diagnosis present

## 2024-01-06 DIAGNOSIS — G4700 Insomnia, unspecified: Secondary | ICD-10-CM | POA: Diagnosis present

## 2024-01-06 DIAGNOSIS — F32A Depression, unspecified: Secondary | ICD-10-CM | POA: Diagnosis present

## 2024-01-06 DIAGNOSIS — Z880 Allergy status to penicillin: Secondary | ICD-10-CM

## 2024-01-06 DIAGNOSIS — K219 Gastro-esophageal reflux disease without esophagitis: Secondary | ICD-10-CM | POA: Diagnosis present

## 2024-01-06 DIAGNOSIS — T8143XA Infection following a procedure, organ and space surgical site, initial encounter: Secondary | ICD-10-CM | POA: Diagnosis not present

## 2024-01-06 DIAGNOSIS — R52 Pain, unspecified: Secondary | ICD-10-CM

## 2024-01-06 DIAGNOSIS — E876 Hypokalemia: Secondary | ICD-10-CM | POA: Diagnosis present

## 2024-01-06 DIAGNOSIS — Z888 Allergy status to other drugs, medicaments and biological substances status: Secondary | ICD-10-CM

## 2024-01-06 DIAGNOSIS — Z515 Encounter for palliative care: Secondary | ICD-10-CM

## 2024-01-06 DIAGNOSIS — C784 Secondary malignant neoplasm of small intestine: Secondary | ICD-10-CM | POA: Diagnosis present

## 2024-01-06 DIAGNOSIS — K660 Peritoneal adhesions (postprocedural) (postinfection): Secondary | ICD-10-CM | POA: Diagnosis present

## 2024-01-06 LAB — CBC WITH DIFFERENTIAL/PLATELET
Abs Immature Granulocytes: 0.03 K/uL (ref 0.00–0.07)
Basophils Absolute: 0 K/uL (ref 0.0–0.1)
Basophils Relative: 1 %
Eosinophils Absolute: 0 K/uL (ref 0.0–0.5)
Eosinophils Relative: 0 %
HCT: 31 % — ABNORMAL LOW (ref 36.0–46.0)
Hemoglobin: 10.3 g/dL — ABNORMAL LOW (ref 12.0–15.0)
Immature Granulocytes: 1 %
Lymphocytes Relative: 13 %
Lymphs Abs: 0.7 K/uL (ref 0.7–4.0)
MCH: 36.3 pg — ABNORMAL HIGH (ref 26.0–34.0)
MCHC: 33.2 g/dL (ref 30.0–36.0)
MCV: 109.2 fL — ABNORMAL HIGH (ref 80.0–100.0)
Monocytes Absolute: 0.5 K/uL (ref 0.1–1.0)
Monocytes Relative: 9 %
Neutro Abs: 4 K/uL (ref 1.7–7.7)
Neutrophils Relative %: 76 %
Platelets: 138 K/uL — ABNORMAL LOW (ref 150–400)
RBC: 2.84 MIL/uL — ABNORMAL LOW (ref 3.87–5.11)
RDW: 24.2 % — ABNORMAL HIGH (ref 11.5–15.5)
Smear Review: NORMAL
WBC: 5.3 K/uL (ref 4.0–10.5)
nRBC: 2.1 % — ABNORMAL HIGH (ref 0.0–0.2)

## 2024-01-06 LAB — URINALYSIS, ROUTINE W REFLEX MICROSCOPIC
Bacteria, UA: NONE SEEN
Bilirubin Urine: NEGATIVE
Glucose, UA: NEGATIVE mg/dL
Ketones, ur: 80 mg/dL — AB
Nitrite: NEGATIVE
Protein, ur: 30 mg/dL — AB
Specific Gravity, Urine: <1.005 — ABNORMAL LOW (ref 1.005–1.030)
pH: 6 (ref 5.0–8.0)

## 2024-01-06 LAB — COMPREHENSIVE METABOLIC PANEL WITH GFR
ALT: 9 U/L (ref 0–44)
AST: 18 U/L (ref 15–41)
Albumin: 3.3 g/dL — ABNORMAL LOW (ref 3.5–5.0)
Alkaline Phosphatase: 60 U/L (ref 38–126)
Anion gap: 18 — ABNORMAL HIGH (ref 5–15)
BUN: 15 mg/dL (ref 6–20)
CO2: 21 mmol/L — ABNORMAL LOW (ref 22–32)
Calcium: 9.3 mg/dL (ref 8.9–10.3)
Chloride: 99 mmol/L (ref 98–111)
Creatinine, Ser: 0.82 mg/dL (ref 0.44–1.00)
GFR, Estimated: >60 mL/min (ref >60)
Glucose, Bld: 95 mg/dL (ref 70–99)
Potassium: 4.2 mmol/L (ref 3.5–5.1)
Sodium: 138 mmol/L (ref 135–145)
Total Bilirubin: 2.5 mg/dL — ABNORMAL HIGH (ref 0.0–1.2)
Total Protein: 6.7 g/dL (ref 6.5–8.1)

## 2024-01-06 LAB — LIPASE, BLOOD: Lipase: 24 U/L (ref 11–51)

## 2024-01-06 MED ORDER — ACETAMINOPHEN 650 MG RE SUPP: 650.0000 mg | Freq: Four times a day (QID) | RECTAL | Status: DC | PRN

## 2024-01-06 MED ORDER — IOHEXOL 300 MG/ML  SOLN
100.0000 mL | Freq: Once | INTRAMUSCULAR | Status: AC | PRN
  Administered 2024-01-06: 100 mL via INTRAVENOUS

## 2024-01-06 MED ORDER — SODIUM CHLORIDE 0.9 % IV SOLN: INTRAVENOUS | Status: DC

## 2024-01-06 MED ORDER — MORPHINE SULFATE (PF) 2 MG/ML IV SOLN: 2.0000 mg | INTRAVENOUS | Status: AC

## 2024-01-06 MED ORDER — ONDANSETRON HCL 4 MG/2ML IJ SOLN
4.0000 mg | Freq: Three times a day (TID) | INTRAMUSCULAR | Status: DC | PRN
  Administered 2024-01-07 – 2024-01-15 (×4): 4 mg via INTRAVENOUS
  Filled 2024-01-06 (×2): qty 2

## 2024-01-06 MED ORDER — ONDANSETRON HCL 4 MG PO TABS: 4.0000 mg | ORAL_TABLET | Freq: Four times a day (QID) | ORAL | Status: DC | PRN

## 2024-01-06 MED ORDER — ONDANSETRON HCL 4 MG/2ML IJ SOLN: 4.0000 mg | Freq: Four times a day (QID) | INTRAMUSCULAR | Status: DC | PRN

## 2024-01-06 MED ORDER — MORPHINE SULFATE (PF) 2 MG/ML IV SOLN
INTRAVENOUS | Status: AC
  Administered 2024-01-06: 2 mg via INTRAVENOUS
  Filled 2024-01-06: qty 1

## 2024-01-06 MED ORDER — ONDANSETRON HCL 4 MG PO TABS: 4.0000 mg | ORAL_TABLET | Freq: Three times a day (TID) | ORAL | Status: DC | PRN

## 2024-01-06 MED ORDER — ENOXAPARIN SODIUM 40 MG/0.4ML IJ SOSY
40.0000 mg | PREFILLED_SYRINGE | INTRAMUSCULAR | Status: DC
  Administered 2024-01-06 – 2024-01-13 (×9): 40 mg via SUBCUTANEOUS
  Filled 2024-01-06 (×8): qty 0.4

## 2024-01-06 MED ORDER — ONDANSETRON HCL 4 MG/2ML IJ SOLN
4.0000 mg | Freq: Once | INTRAMUSCULAR | Status: AC
  Administered 2024-01-06: 4 mg via INTRAVENOUS
  Filled 2024-01-06: qty 2

## 2024-01-06 MED ORDER — MORPHINE SULFATE (PF) 2 MG/ML IV SOLN
2.0000 mg | INTRAVENOUS | Status: DC | PRN
  Administered 2024-01-06: 2 mg via INTRAVENOUS
  Filled 2024-01-06: qty 1

## 2024-01-06 MED ORDER — ACETAMINOPHEN 325 MG PO TABS: 650.0000 mg | ORAL_TABLET | Freq: Four times a day (QID) | ORAL | Status: DC | PRN

## 2024-01-06 MED ORDER — DEXTROSE-SODIUM CHLORIDE 5-0.9 % IV SOLN: INTRAVENOUS | Status: AC

## 2024-01-06 NOTE — ED Provider Notes (Signed)
 [place] EMERGENCY DEPARTMENT AT [facility] Provider Note   CSN: [id number] Arrival date & time: 01/06/24  1255     Patient presents with: Abdominal Pain   Nicole Santos is a 53 y.o. female.   HPI Patient presents with her daughter who assists with the history.  She notes that she has been struggling with abdominal pain for the last few weeks, has become noticeably worse over the past few days with worsening anorexia, nausea, emesis.  She has seen her physician, has been advised to use soft diet.  In spite of this, symptoms are worse.  She has not yet followed up with GI as she has been intermittently ill unable to follow-up.    Prior to Admission medications   Medication Sig Start Date End Date Taking? Authorizing Provider  atorvastatin  (LIPITOR) 20 MG tablet Take 1 tablet (20 mg total) by mouth at bedtime. 07/03/23   Jolinda Norene HERO, DO  diphenoxylate -atropine  (LOMOTIL ) 2.5-0.025 MG tablet Take 2 tablets by mouth after the first watery bowel movement. May then take 1 pill by mouth after each watery bowel movement. Do not exceed 8 pills in a 24 hour period. 07/15/23   Rogers Hai, MD  DULoxetine  (CYMBALTA ) 30 MG capsule Take 2 capsules (60 mg total) by mouth daily. 11/07/23   Johnson, Clanford L, MD  ELIQUIS  5 MG TABS tablet Take 5 mg by mouth 2 (two) times daily. 11/19/23   [provider]  lidocaine -prilocaine  (EMLA ) cream Apply a quarter sized amount to port a cath site and cover with plastic wrap 1 hour prior to infusion appointments 12/17/22   Rogers Hai, MD  LYNPARZA  150 MG tablet TAKE 2 TABLETS TWICE A DAY. MAY TAKE WITH FOOD TO DECEASE NAUSEA AND VOMITING. (SWALLOW WHOLE) 07/19/23   Rogers Hai, MD  magnesium  oxide (MAG-OX) 400 (240 Mg) MG tablet TAKE 1 TABLET BY MOUTH TWICE A DAY 12/24/23   Rogers Hai, MD  Multiple Vitamin (MULTIVITAMIN) capsule Take 1 capsule by mouth daily.    [provider]  ondansetron  (ZOFRAN ) 4  MG tablet Take 1 tablet (4 mg total) by mouth every 6 (six) hours as needed for nausea. 11/07/23   Johnson, Clanford L, MD  polyethylene glycol (MIRALAX  / GLYCOLAX ) 17 g packet Take 17 g by mouth daily. 11/08/23   Johnson, Clanford L, MD  potassium chloride  SA (KLOR-CON  M) 20 MEQ tablet Take 1 tablet (20 mEq total) by mouth daily. 01/01/24   Rogers Hai, MD    Allergies: Augmentin  [amoxicillin -pot clavulanate], Doxycycline, and Topamax  [topiramate ]    Review of Systems  Updated Vital Signs BP (!) 143/92   Pulse 97   Temp 97.7 F (36.5 C) (Oral)   Resp 18   Ht 5' 2 (1.575 m)   Wt 80.6 kg   SpO2 97%   BMI 32.50 kg/m   Physical Exam Vitals and nursing note reviewed.  Constitutional:      General: She is not in acute distress.    Appearance: She is well-developed.  HENT:     Head: Normocephalic and atraumatic.  Eyes:     Conjunctiva/sclera: Conjunctivae normal.  Cardiovascular:     Rate and Rhythm: Regular rhythm. Tachycardia present.  Pulmonary:     Effort: Pulmonary effort is normal. No respiratory distress.     Breath sounds: No stridor.  Abdominal:     General: There is no distension.     Tenderness: There is generalized abdominal tenderness.  Skin:    General: Skin is  warm and dry.  Neurological:     Mental Status: She is alert and oriented to person, place, and time.     Cranial Nerves: No cranial nerve deficit.  Psychiatric:        Mood and Affect: Mood normal.     (all labs ordered are listed, but only abnormal results are displayed) Labs Reviewed  COMPREHENSIVE METABOLIC PANEL WITH GFR - Abnormal; Notable for the following components:      Result Value   CO2 21 (*)    Albumin 3.3 (*)    Total Bilirubin 2.5 (*)    Anion gap 18 (*)    All other components within normal limits  CBC WITH DIFFERENTIAL/PLATELET - Abnormal; Notable for the following components:   RBC 2.84 (*)    Hemoglobin 10.3 (*)    HCT 31.0 (*)    MCV 109.2 (*)    MCH 36.3 (*)     RDW 24.2 (*)    Platelets 138 (*)    nRBC 2.1 (*)    All other components within normal limits  LIPASE, BLOOD  URINALYSIS, ROUTINE W REFLEX MICROSCOPIC    EKG: None  Radiology: CT ABDOMEN PELVIS W CONTRAST Result Date: 01/06/2024 CLINICAL DATA:  Abdominal pain with nausea and vomiting two days ago. History of ovarian cancer. EXAM: CT ABDOMEN AND PELVIS WITH CONTRAST TECHNIQUE: Multidetector CT imaging of the abdomen and pelvis was performed using the standard protocol following bolus administration of intravenous contrast. RADIATION DOSE REDUCTION: This exam was performed according to the departmental dose-optimization program which includes automated exposure control, adjustment of the mA and/or kV according to patient size and/or use of iterative reconstruction technique. CONTRAST:  OMNIPAQUE  IOHEXOL  300 MG/ML  SOLN COMPARISON:  11/04/2023 FINDINGS: Lower chest: Heart is normal size.  Lung bases are clear. Hepatobiliary: Liver and biliary tree are normal. There is moderate cholelithiasis. Pancreas: Normal. Spleen: Normal. Adrenals/Urinary Tract: Adrenal glands are normal. Kidneys are normal in size without hydronephrosis or nephrolithiasis. Stable left renal cyst. Ureters are normal. Mild hazy bladder wall thickening circumferentially unchanged. Stomach/Bowel: Stomach is normal. Evidence of multiple persistent fluid-filled dilated small bowel loops measuring up to 4.8 cm in diameter with slight interval worsening. This appears to be due to a distal obstruction with transition point likely over the midline pelvis involving an ileal loop. Air is present throughout the colon. Appendix not definitely visualized. Vascular/Lymphatic: Abdominal aorta is normal in caliber with minimal calcified plaque distally. Remaining vascular structures are unremarkable. No adenopathy. Reproductive: Small ill-defined uterus containing peripherally enhancing 2.1 cm mass likely uterine fibroid as these findings are  unchanged. Adnexal regions unremarkable. Other: Worsening mild free fluid over the upper abdomen with minimal free fluid adjacent the bladder. Acute evidence of small anterior peritoneal implants without significant change several small masses within the subcutaneous fat of the anterior lower abdominal/pelvic wall which are new as cannot exclude metastatic disease. Small ventral hernia right of midline over the mid to upper abdomen unchanged and containing only peritoneal fat. Periumbilical hernia less prominent containing partial segment of small bowel. Musculoskeletal: No focal abnormality. IMPRESSION: 1. Evidence of multiple persistent fluid-filled dilated small bowel loops measuring up to 4.8 cm in diameter with slight interval worsening. This appears to be due to a distal obstruction with transition point likely over the midline pelvis involving an ileal loop likely due to adhesions. 2. Worsening mild free fluid over the upper abdomen with minimal stable free fluid adjacent the bladder. 3. Evidence of small anterior peritoneal implants  without significant change. Several small masses within the subcutaneous fat of the anterior lower abdominal/pelvic wall which are new and may be additional sites of metastatic disease in this patient with known ovarian cancer. 4. Cholelithiasis. 5. Stable left renal cyst. 6. Small ventral hernia right of midline over the mid to upper abdomen unchanged and containing only peritoneal fat. Periumbilical hernia less prominent containing partial segment of small bowel. 7. Aortic atherosclerosis. Aortic Atherosclerosis (ICD10-I70.0). Electronically Signed   By: Toribio Agreste M.D.   On: 01/06/2024 16:09     Procedures   Medications Ordered in the ED  0.9 %  sodium chloride  infusion ( Intravenous New Bag/Given 01/06/24 1357)  ondansetron  (ZOFRAN ) injection 4 mg (4 mg Intravenous Given 01/06/24 1357)  iohexol  (OMNIPAQUE ) 300 MG/ML solution 100 mL (100 mLs Intravenous Contrast Given  01/06/24 1456)                                    Medical Decision Making Obese adult female with ovarian cancer, multiple other medical issues presents with diffuse abdominal pain, nausea, anorexia, vomiting.  Broad differential including malignancy, obstruction, infection, dehydration given patient's initial tachycardia.  Cardiac 120 sinus abnormal pulse ox 99% room air normal  Amount and/or Complexity of Data Reviewed Independent Historian: caregiver External Data Reviewed: notes. Labs: ordered. Decision-making details documented in ED Course. Radiology: ordered and independent interpretation performed. Decision-making details documented in ED Course.  Risk Prescription drug management. Decision regarding hospitalization. Diagnosis or treatment significantly limited by social determinants of health.   4:36 PM CT reviewed, concerning for bowel obstruction with transition point.  With patient's worsening pain, nausea, anorexia, NG tube ordered. I discussed the patient's case with our surgical colleague and the patient will be seen tomorrow in consultation. With bowel obstruction in the context of known ovarian cancer patient will require admission to our hospitalist team.     Final diagnoses:  SBO (small bowel obstruction) [facility])    ED Discharge Orders     None          Garrick Charleston, MD 01/06/24 1636

## 2024-01-06 NOTE — H&P (Signed)
 History and Physical    Nicole Santos FMW:[id number] DOB: 06/13/1970 DOA: 01/06/2024  PCP: Jolinda Norene HERO, DO   Patient coming from: Home  I have personally briefly reviewed patient's old medical records in [facility]  Chief Complaint: Abdominal pain, Vomiting  HPI: Nicole Santos is a 53 y.o. female with medical history significant for ovarian cancer, peritoneal carcinomatosis, pulmonary embolism, hypertension. Patient presented to the ED with complaints of worsening of her chronic abdominal pain that started 3 days ago, with vomiting.  She has been compliant with soft diet as recommended by her providers, but she reports persistent pain with oral intake.  Last hospitalization 11/04/2023 through 11/07/2023 with partial small bowel obstruction-managed conservatively  ED Course: Initial tachycardia heart rate up to 127 now improved. CT [facility] with contrast-suggest small bowel obstruction. EDP talked to general surgeon Dr. Evonnie, will see in consult, can hold NG tube as patient has not had any vomiting in 2 days.  Review of Systems: As per HPI all other systems reviewed and negative.  Past Medical History:  Diagnosis Date   Acute pulmonary embolism (HCC) 03/06/2022   Acute respiratory disease due to COVID-19 virus 02/02/2019   Anxiety    Cancer (HCC)    Depression    Dyspnea    Dysrhythmia    Family history of bladder cancer    Family history of breast cancer    History of diabetes mellitus    Resolved in Sept 2023 with weight loss.   Hypertension    Ovarian cancer (HCC) 11/14/2021   Peritoneal carcinomatosis (HCC)    Pneumonia    Sleep apnea    doesn't use CPAP   Tachycardia     Past Surgical History:  Procedure Laterality Date   KNEE SURGERY Right    Arthroscopic   LAPAROSCOPIC ABDOMINAL EXPLORATION N/A 11/13/2021   Procedure: Exploratory laparoscopy;  Surgeon: Mavis Anes, MD;  Location: [facility];  Service: General;  Laterality: N/A;   PORTACATH PLACEMENT Left  11/15/2021   Procedure: INSERTION PORT-A-CATH;  Surgeon: Mavis Anes, MD;  Location: [facility];  Service: General;  Laterality: Left;   TONSILLECTOMY AND ADENOIDECTOMY       reports that she has never smoked. She has never used smokeless tobacco. She reports that she does not drink alcohol and does not use drugs.  Allergies  Allergen Reactions   Augmentin  [Amoxicillin -Pot Clavulanate] Diarrhea and Nausea And Vomiting   Doxycycline Other (See Comments)    Abdominal cramps.   Topamax  [Topiramate ] Nausea And Vomiting    Family History  Problem Relation Age of Onset   Hypertension Mother    Heart attack Paternal Uncle    Bladder Cancer Maternal Grandmother 58       non-smoker   Dementia Maternal Grandfather    Breast cancer Maternal Great-grandmother        MGF's mother   Colon cancer Neg Hx    Ovarian cancer Neg Hx    Endometrial cancer Neg Hx    Pancreatic cancer Neg Hx    Prostate cancer Neg Hx    Prior to Admission medications   Medication Sig Start Date End Date Taking? Authorizing Provider  atorvastatin  (LIPITOR) 20 MG tablet Take 1 tablet (20 mg total) by mouth at bedtime. 07/03/23  Yes Gottschalk, Norene M, DO  DULoxetine  (CYMBALTA ) 30 MG capsule Take 2 capsules (60 mg total) by mouth daily. 11/07/23  Yes Johnson, Clanford L, MD  ELIQUIS  5 MG TABS tablet Take 5 mg by mouth 2 (two)  times daily. 11/19/23  Yes [provider]  lidocaine -prilocaine  (EMLA ) cream Apply a quarter sized amount to port a cath site and cover with plastic wrap 1 hour prior to infusion appointments 12/17/22  Yes Rogers Hai, MD  LYNPARZA  150 MG tablet TAKE 2 TABLETS TWICE A DAY. MAY TAKE WITH FOOD TO DECEASE NAUSEA AND VOMITING. (SWALLOW WHOLE) 07/19/23  Yes Rogers Hai, MD  magnesium  oxide (MAG-OX) 400 (240 Mg) MG tablet TAKE 1 TABLET BY MOUTH TWICE A DAY 12/24/23  Yes Rogers Hai, MD  potassium chloride  SA (KLOR-CON  M) 20 MEQ tablet Take 1 tablet (20 mEq total) by mouth  daily. Patient taking differently: Take 40 mEq by mouth at bedtime. 01/01/24  Yes Rogers Hai, MD    Physical Exam: Vitals:   01/06/24 1700 01/06/24 1730 01/06/24 1800 01/06/24 1830  BP: 114/79 109/75 113/73 114/77  Pulse: (!) 105 98 87 91  Resp:      Temp:      TempSrc:      SpO2: 100%  100% 98%  Weight:      Height:        Constitutional: NAD, calm, comfortable Vitals:   01/06/24 1700 01/06/24 1730 01/06/24 1800 01/06/24 1830  BP: 114/79 109/75 113/73 114/77  Pulse: (!) 105 98 87 91  Resp:      Temp:      TempSrc:      SpO2: 100%  100% 98%  Weight:      Height:       Eyes: PERRL, lids and conjunctivae normal ENMT: Mucous membranes are mildly dry Neck: normal, supple, no masses, no thyromegaly Respiratory: clear to auscultation bilaterally, no wheezing, no crackles. Normal respiratory effort. No accessory muscle use.  Cardiovascular: Regular rate and rhythm, no murmurs / rubs / gallops. No extremity edema.   Abdomen: Minimal diffuse tenderness, soft, not distended, no masses palpated. No hepatosplenomegaly.   Musculoskeletal: no clubbing / cyanosis. No joint deformity upper and lower extremities..  Skin: no rashes, lesions, ulcers. No induration Neurologic: No facial asymmetry, moving extremities spontaneously, speech fluent. Psychiatric: Normal judgment and insight. Alert and oriented x 3. Normal mood.   Labs on Admission: I have personally reviewed following labs and imaging studies  CBC: Recent Labs  Lab 01/06/24 1350  WBC 5.3  NEUTROABS 4.0  HGB 10.3*  HCT 31.0*  MCV 109.2*  PLT 138*   Basic Metabolic Panel: Recent Labs  Lab 01/06/24 1350  NA 138  K 4.2  CL 99  CO2 21*  GLUCOSE 95  BUN 15  CREATININE 0.82  CALCIUM  9.3   GFR: Estimated Creatinine Clearance: 76.2 mL/min (by C-G formula based on SCr of 0.82 mg/dL). Liver Function Tests: Recent Labs  Lab 01/06/24 1350  AST 18  ALT 9  ALKPHOS 60  BILITOT 2.5*  PROT 6.7  ALBUMIN 3.3*    Recent Labs  Lab 01/06/24 1350  LIPASE 24   Urine analysis:    Component Value Date/Time   COLORURINE YELLOW 01/06/2024 1651   APPEARANCEUR HAZY (A) 01/06/2024 1651   APPEARANCEUR Clear 11/03/2021 1613   LABSPEC <1.005 (L) 01/06/2024 1651   PHURINE 6.0 01/06/2024 1651   GLUCOSEU NEGATIVE 01/06/2024 1651   HGBUR MODERATE (A) 01/06/2024 1651   BILIRUBINUR NEGATIVE 01/06/2024 1651   BILIRUBINUR Negative 11/03/2021 1613   KETONESUR 80 (A) 01/06/2024 1651   PROTEINUR 30 (A) 01/06/2024 1651   NITRITE NEGATIVE 01/06/2024 1651   LEUKOCYTESUR SMALL (A) 01/06/2024 1651    Radiological Exams on Admission: CT ABDOMEN  PELVIS W CONTRAST Result Date: 01/06/2024 CLINICAL DATA:  Abdominal pain with nausea and vomiting two days ago. History of ovarian cancer. EXAM: CT ABDOMEN AND PELVIS WITH CONTRAST TECHNIQUE: Multidetector CT imaging of the abdomen and pelvis was performed using the standard protocol following bolus administration of intravenous contrast. RADIATION DOSE REDUCTION: This exam was performed according to the departmental dose-optimization program which includes automated exposure control, adjustment of the mA and/or kV according to patient size and/or use of iterative reconstruction technique. CONTRAST:  OMNIPAQUE  IOHEXOL  300 MG/ML  SOLN COMPARISON:  11/04/2023 FINDINGS: Lower chest: Heart is normal size.  Lung bases are clear. Hepatobiliary: Liver and biliary tree are normal. There is moderate cholelithiasis. Pancreas: Normal. Spleen: Normal. Adrenals/Urinary Tract: Adrenal glands are normal. Kidneys are normal in size without hydronephrosis or nephrolithiasis. Stable left renal cyst. Ureters are normal. Mild hazy bladder wall thickening circumferentially unchanged. Stomach/Bowel: Stomach is normal. Evidence of multiple persistent fluid-filled dilated small bowel loops measuring up to 4.8 cm in diameter with slight interval worsening. This appears to be due to a distal obstruction  with transition point likely over the midline pelvis involving an ileal loop. Air is present throughout the colon. Appendix not definitely visualized. Vascular/Lymphatic: Abdominal aorta is normal in caliber with minimal calcified plaque distally. Remaining vascular structures are unremarkable. No adenopathy. Reproductive: Small ill-defined uterus containing peripherally enhancing 2.1 cm mass likely uterine fibroid as these findings are unchanged. Adnexal regions unremarkable. Other: Worsening mild free fluid over the upper abdomen with minimal free fluid adjacent the bladder. Acute evidence of small anterior peritoneal implants without significant change several small masses within the subcutaneous fat of the anterior lower abdominal/pelvic wall which are new as cannot exclude metastatic disease. Small ventral hernia right of midline over the mid to upper abdomen unchanged and containing only peritoneal fat. Periumbilical hernia less prominent containing partial segment of small bowel. Musculoskeletal: No focal abnormality. IMPRESSION: 1. Evidence of multiple persistent fluid-filled dilated small bowel loops measuring up to 4.8 cm in diameter with slight interval worsening. This appears to be due to a distal obstruction with transition point likely over the midline pelvis involving an ileal loop likely due to adhesions. 2. Worsening mild free fluid over the upper abdomen with minimal stable free fluid adjacent the bladder. 3. Evidence of small anterior peritoneal implants without significant change. Several small masses within the subcutaneous fat of the anterior lower abdominal/pelvic wall which are new and may be additional sites of metastatic disease in this patient with known ovarian cancer. 4. Cholelithiasis. 5. Stable left renal cyst. 6. Small ventral hernia right of midline over the mid to upper abdomen unchanged and containing only peritoneal fat. Periumbilical hernia less prominent containing partial  segment of small bowel. 7. Aortic atherosclerosis. Aortic Atherosclerosis (ICD10-I70.0). Electronically Signed   By: Toribio Agreste M.D.   On: 01/06/2024 16:09   EKG: None   Assessment/Plan Principal Problem:   Small bowel obstruction (HCC) Active Problems:   Essential hypertension   Primary peritoneal carcinomatosis (HCC)   Pulmonary embolism (HCC)   Assessment and Plan: * Small bowel obstruction (HCC) Recurrent small bowel obstruction- last hospitalization 11/04/2023 through 11/07/2023 with partial small bowel obstruction-managed conservatively.  History of ovarian cancer with peritoneal carcinomatosis. -CTAP WC today-multiple persistent fluid-filled dilated small bowel loops, slight interval worsening.  Distal obstruction with transition point likely over the midline pelvis.  Also findings consistent with carcinomatosis. (See detailed report) - EDP talked to general surgeon Dr. Evonnie, will see in consult -NG tube  deferred, as patient has not vomited in 2 days. - D5 N/s 75cc/hr x 1 day - Morphine  2 mg every 4 hourly as needed -Zofran  as needed  Pulmonary embolism (HCC) Reports compliance with Eliquis .  Hold Eliquis  while NPO. -Lovenox -DVT prophylaxis dose  Primary peritoneal carcinomatosis (HCC) [facility] Dr[facility]ragadda[facility].  S/p 3 cycles of adj platinum-based therapy after surgery, currently on olaparib , her maintenance bevacizumab  is being held per notes.  Essential hypertension Stable.  Not on antihypertensives   DVT prophylaxis: Lovenox  Code Status: Full code, confirmed with patient and son Arthea at bedside Family Communication: Son Arthea at bedside Disposition Plan: ~ 2 days Consults called: Gen surg Admission status: Obs med surg I certify that at the point of admission it is my clinical judgment that the patient will require inpatient hospital care spanning beyond 2 midnights from the point of admission due to high intensity of service,  high risk for further deterioration and high frequency of surveillance required.    Author: Tully FORBES Carwin, MD 01/06/2024 7:34 PM  For on call review [email or web address].

## 2024-01-06 NOTE — Assessment & Plan Note (Signed)
Stable.  Not on antihypertensives.

## 2024-01-06 NOTE — ED Triage Notes (Signed)
 Pt with abd pain for weeks.  Pt not able to sleep due to pain and N/V x 2 on Saturday. Has not ate since Saturday per pt.

## 2024-01-06 NOTE — Assessment & Plan Note (Addendum)
 Recurrent small bowel obstruction- last hospitalization 11/04/2023 through 11/07/2023 with partial small bowel obstruction-managed conservatively.  History of ovarian cancer with peritoneal carcinomatosis. -CTAP WC today-multiple persistent fluid-filled dilated small bowel loops, slight interval worsening.  Distal obstruction with transition point likely over the midline pelvis.  Also findings consistent with carcinomatosis. (See detailed report) - EDP talked to general surgeon Dr. Evonnie, will see in consult -NG tube deferred, as patient has not vomited in 2 days. - D5 N/s 75cc/hr x 1 day - Morphine  2 mg every 4 hourly as needed -Zofran  as needed

## 2024-01-06 NOTE — Assessment & Plan Note (Signed)
 Reports compliance with Eliquis .  Hold Eliquis  while NPO. -Lovenox -DVT prophylaxis dose

## 2024-01-06 NOTE — Assessment & Plan Note (Signed)
 [facility] Dr. Rogers[facility].  S/p 3 cycles of adj platinum-based therapy after surgery, currently on olaparib , her maintenance bevacizumab  is being held per notes.

## 2024-01-07 ENCOUNTER — Encounter (HOSPITAL_COMMUNITY): Payer: Self-pay | Admitting: Internal Medicine

## 2024-01-07 ENCOUNTER — Observation Stay (HOSPITAL_COMMUNITY): Admitting: Certified Registered Nurse Anesthetist

## 2024-01-07 ENCOUNTER — Inpatient Hospital Stay (HOSPITAL_COMMUNITY)

## 2024-01-07 ENCOUNTER — Encounter (HOSPITAL_COMMUNITY)
Admission: EM | Disposition: A | Discharge status: Hospice | Payer: Self-pay | Source: Home / Self Care | Attending: Family Medicine

## 2024-01-07 ENCOUNTER — Other Ambulatory Visit: Payer: Self-pay

## 2024-01-07 DIAGNOSIS — R52 Pain, unspecified: Secondary | ICD-10-CM | POA: Diagnosis not present

## 2024-01-07 DIAGNOSIS — C784 Secondary malignant neoplasm of small intestine: Secondary | ICD-10-CM | POA: Diagnosis present

## 2024-01-07 DIAGNOSIS — K56609 Unspecified intestinal obstruction, unspecified as to partial versus complete obstruction: Secondary | ICD-10-CM

## 2024-01-07 DIAGNOSIS — E876 Hypokalemia: Secondary | ICD-10-CM | POA: Diagnosis present

## 2024-01-07 DIAGNOSIS — K660 Peritoneal adhesions (postprocedural) (postinfection): Secondary | ICD-10-CM | POA: Diagnosis present

## 2024-01-07 DIAGNOSIS — C786 Secondary malignant neoplasm of retroperitoneum and peritoneum: Secondary | ICD-10-CM

## 2024-01-07 DIAGNOSIS — D630 Anemia in neoplastic disease: Secondary | ICD-10-CM | POA: Diagnosis present

## 2024-01-07 DIAGNOSIS — Z515 Encounter for palliative care: Secondary | ICD-10-CM | POA: Diagnosis not present

## 2024-01-07 DIAGNOSIS — C801 Malignant (primary) neoplasm, unspecified: Secondary | ICD-10-CM | POA: Diagnosis not present

## 2024-01-07 DIAGNOSIS — I2782 Chronic pulmonary embolism: Secondary | ICD-10-CM | POA: Diagnosis not present

## 2024-01-07 DIAGNOSIS — C482 Malignant neoplasm of peritoneum, unspecified: Secondary | ICD-10-CM | POA: Diagnosis not present

## 2024-01-07 DIAGNOSIS — R630 Anorexia: Secondary | ICD-10-CM | POA: Diagnosis not present

## 2024-01-07 DIAGNOSIS — Z6832 Body mass index (BMI) 32.0-32.9, adult: Secondary | ICD-10-CM | POA: Diagnosis not present

## 2024-01-07 DIAGNOSIS — E11649 Type 2 diabetes mellitus with hypoglycemia without coma: Secondary | ICD-10-CM | POA: Diagnosis not present

## 2024-01-07 DIAGNOSIS — R000 Tachycardia, unspecified: Secondary | ICD-10-CM | POA: Diagnosis present

## 2024-01-07 DIAGNOSIS — G8918 Other acute postprocedural pain: Secondary | ICD-10-CM | POA: Diagnosis not present

## 2024-01-07 DIAGNOSIS — D696 Thrombocytopenia, unspecified: Secondary | ICD-10-CM | POA: Diagnosis present

## 2024-01-07 DIAGNOSIS — G8929 Other chronic pain: Secondary | ICD-10-CM | POA: Diagnosis present

## 2024-01-07 DIAGNOSIS — Z8616 Personal history of COVID-19: Secondary | ICD-10-CM | POA: Diagnosis not present

## 2024-01-07 DIAGNOSIS — K56699 Other intestinal obstruction unspecified as to partial versus complete obstruction: Secondary | ICD-10-CM | POA: Diagnosis present

## 2024-01-07 DIAGNOSIS — E871 Hypo-osmolality and hyponatremia: Secondary | ICD-10-CM | POA: Diagnosis present

## 2024-01-07 DIAGNOSIS — F32A Depression, unspecified: Secondary | ICD-10-CM | POA: Diagnosis present

## 2024-01-07 DIAGNOSIS — G4700 Insomnia, unspecified: Secondary | ICD-10-CM | POA: Diagnosis present

## 2024-01-07 DIAGNOSIS — T8143XA Infection following a procedure, organ and space surgical site, initial encounter: Secondary | ICD-10-CM | POA: Diagnosis not present

## 2024-01-07 DIAGNOSIS — F419 Anxiety disorder, unspecified: Secondary | ICD-10-CM | POA: Diagnosis present

## 2024-01-07 DIAGNOSIS — D62 Acute posthemorrhagic anemia: Secondary | ICD-10-CM | POA: Diagnosis not present

## 2024-01-07 DIAGNOSIS — R180 Malignant ascites: Secondary | ICD-10-CM | POA: Diagnosis not present

## 2024-01-07 DIAGNOSIS — E785 Hyperlipidemia, unspecified: Secondary | ICD-10-CM | POA: Diagnosis present

## 2024-01-07 DIAGNOSIS — I10 Essential (primary) hypertension: Secondary | ICD-10-CM

## 2024-01-07 DIAGNOSIS — R109 Unspecified abdominal pain: Secondary | ICD-10-CM | POA: Diagnosis present

## 2024-01-07 DIAGNOSIS — Z7189 Other specified counseling: Secondary | ICD-10-CM | POA: Diagnosis not present

## 2024-01-07 DIAGNOSIS — C569 Malignant neoplasm of unspecified ovary: Secondary | ICD-10-CM | POA: Diagnosis present

## 2024-01-07 DIAGNOSIS — R110 Nausea: Secondary | ICD-10-CM | POA: Diagnosis not present

## 2024-01-07 HISTORY — PX: BOWEL RESECTION: SHX1257

## 2024-01-07 HISTORY — PX: ILEOSTOMY: SHX1783

## 2024-01-07 HISTORY — PX: LAPAROTOMY: SHX154

## 2024-01-07 LAB — CBC
HCT: 28.1 % — ABNORMAL LOW (ref 36.0–46.0)
Hemoglobin: 9.1 g/dL — ABNORMAL LOW (ref 12.0–15.0)
MCH: 36.7 pg — ABNORMAL HIGH (ref 26.0–34.0)
MCHC: 32.4 g/dL (ref 30.0–36.0)
MCV: 113.3 fL — ABNORMAL HIGH (ref 80.0–100.0)
Platelets: 116 K/uL — ABNORMAL LOW (ref 150–400)
RBC: 2.48 MIL/uL — ABNORMAL LOW (ref 3.87–5.11)
RDW: 24.2 % — ABNORMAL HIGH (ref 11.5–15.5)
WBC: 4.3 K/uL (ref 4.0–10.5)
nRBC: 1.4 % — ABNORMAL HIGH (ref 0.0–0.2)

## 2024-01-07 LAB — BASIC METABOLIC PANEL WITH GFR
Anion gap: 12 (ref 5–15)
BUN: 13 mg/dL (ref 6–20)
CO2: 24 mmol/L (ref 22–32)
Calcium: 8.4 mg/dL — ABNORMAL LOW (ref 8.9–10.3)
Chloride: 105 mmol/L (ref 98–111)
Creatinine, Ser: 0.73 mg/dL (ref 0.44–1.00)
GFR, Estimated: >60 mL/min (ref >60)
Glucose, Bld: 111 mg/dL — ABNORMAL HIGH (ref 70–99)
Potassium: 3.4 mmol/L — ABNORMAL LOW (ref 3.5–5.1)
Sodium: 141 mmol/L (ref 135–145)

## 2024-01-07 SURGERY — LAPAROTOMY, EXPLORATORY
Anesthesia: General | Site: Abdomen

## 2024-01-07 MED ORDER — PHENYLEPHRINE 80 MCG/ML (10ML) SYRINGE FOR IV PUSH (FOR BLOOD PRESSURE SUPPORT)
PREFILLED_SYRINGE | INTRAVENOUS | Status: DC | PRN
Start: 2024-01-07 — End: 2024-01-07
  Administered 2024-01-07 (×7): 160 ug via INTRAVENOUS

## 2024-01-07 MED ORDER — BUPIVACAINE HCL (PF) 0.5 % IJ SOLN
INTRAMUSCULAR | Status: AC
  Filled 2024-01-07: qty 30

## 2024-01-07 MED ORDER — FENTANYL CITRATE (PF) 250 MCG/5ML IJ SOLN
INTRAMUSCULAR | Status: DC | PRN
  Administered 2024-01-07 (×2): 100 ug via INTRAVENOUS
  Administered 2024-01-07 (×3): 50 ug via INTRAVENOUS
  Administered 2024-01-07: 100 ug via INTRAVENOUS
  Administered 2024-01-07: 50 ug via INTRAVENOUS

## 2024-01-07 MED ORDER — MIDAZOLAM HCL 2 MG/2ML IJ SOLN
INTRAMUSCULAR | Status: AC
  Filled 2024-01-07: qty 2

## 2024-01-07 MED ORDER — SUGAMMADEX SODIUM 200 MG/2ML IV SOLN
INTRAVENOUS | Status: DC | PRN
  Administered 2024-01-07: 200 mg via INTRAVENOUS

## 2024-01-07 MED ORDER — ACETAMINOPHEN 500 MG PO TABS
1000.0000 mg | ORAL_TABLET | Freq: Four times a day (QID) | ORAL | Status: DC
  Administered 2024-01-10 – 2024-01-13 (×7): 1000 mg via ORAL
  Filled 2024-01-07 (×12): qty 2

## 2024-01-07 MED ORDER — PHENYLEPHRINE HCL-NACL 20-0.9 MG/250ML-% IV SOLN
INTRAVENOUS | Status: DC | PRN
Start: 2024-01-07 — End: 2024-01-07
  Administered 2024-01-07: 50 ug/min via INTRAVENOUS

## 2024-01-07 MED ORDER — ORAL CARE MOUTH RINSE: 15.0000 mL | Freq: Once | OROMUCOSAL | Status: DC

## 2024-01-07 MED ORDER — DEXMEDETOMIDINE HCL IN NACL 80 MCG/20ML IV SOLN
INTRAVENOUS | Status: DC | PRN
  Administered 2024-01-07 (×2): 20 ug via INTRAVENOUS

## 2024-01-07 MED ORDER — METHOCARBAMOL 1000 MG/10ML IJ SOLN
500.0000 mg | Freq: Three times a day (TID) | INTRAMUSCULAR | Status: DC
  Administered 2024-01-07 – 2024-01-10 (×8): 500 mg via INTRAVENOUS
  Filled 2024-01-07 (×8): qty 10

## 2024-01-07 MED ORDER — OXYCODONE HCL 5 MG PO TABS
5.0000 mg | ORAL_TABLET | ORAL | Status: DC | PRN
  Administered 2024-01-10 – 2024-01-12 (×4): 5 mg via ORAL
  Filled 2024-01-07 (×4): qty 1

## 2024-01-07 MED ORDER — DEXAMETHASONE SODIUM PHOSPHATE 10 MG/ML IJ SOLN: 8.0000 mg | Freq: Once | INTRAMUSCULAR | Status: DC | PRN

## 2024-01-07 MED ORDER — MORPHINE SULFATE (PF) 2 MG/ML IV SOLN
2.0000 mg | INTRAVENOUS | Status: DC | PRN
  Administered 2024-01-07: 2 mg via INTRAVENOUS
  Filled 2024-01-07: qty 1

## 2024-01-07 MED ORDER — FENTANYL CITRATE (PF) 100 MCG/2ML IJ SOLN
INTRAMUSCULAR | Status: AC
  Filled 2024-01-07: qty 2

## 2024-01-07 MED ORDER — PHENYLEPHRINE HCL-NACL 20-0.9 MG/250ML-% IV SOLN
INTRAVENOUS | Status: AC
  Filled 2024-01-07: qty 250

## 2024-01-07 MED ORDER — CEFAZOLIN SODIUM-DEXTROSE 2-4 GM/100ML-% IV SOLN
2.0000 g | INTRAVENOUS | Status: AC
  Administered 2024-01-07: 2 g via INTRAVENOUS

## 2024-01-07 MED ORDER — CHLORHEXIDINE GLUCONATE 0.12 % MT SOLN: 15.0000 mL | Freq: Once | OROMUCOSAL | Status: DC

## 2024-01-07 MED ORDER — FENTANYL CITRATE (PF) 250 MCG/5ML IJ SOLN
INTRAMUSCULAR | Status: AC
  Filled 2024-01-07: qty 5

## 2024-01-07 MED ORDER — CHLORHEXIDINE GLUCONATE CLOTH 2 % EX PADS: 6.0000 | MEDICATED_PAD | Freq: Once | CUTANEOUS | Status: DC

## 2024-01-07 MED ORDER — HYDROMORPHONE HCL 1 MG/ML IJ SOLN
0.5000 mg | INTRAMUSCULAR | Status: DC | PRN
  Administered 2024-01-07 – 2024-01-08 (×4): 0.5 mg via INTRAVENOUS
  Filled 2024-01-07 (×4): qty 0.5

## 2024-01-07 MED ORDER — DEXAMETHASONE SODIUM PHOSPHATE 10 MG/ML IJ SOLN
INTRAMUSCULAR | Status: DC | PRN
Start: 2024-01-07 — End: 2024-01-07
  Administered 2024-01-07: 10 mg via INTRAVENOUS

## 2024-01-07 MED ORDER — MIDAZOLAM HCL 2 MG/2ML IJ SOLN
INTRAMUSCULAR | Status: DC | PRN
Start: 2024-01-07 — End: 2024-01-07
  Administered 2024-01-07: 2 mg via INTRAVENOUS

## 2024-01-07 MED ORDER — SODIUM CHLORIDE 0.9% FLUSH
10.0000 mL | INTRAVENOUS | Status: DC | PRN
  Administered 2024-01-18: 10 mL

## 2024-01-07 MED ORDER — LIDOCAINE 2% (20 MG/ML) 5 ML SYRINGE
INTRAMUSCULAR | Status: DC | PRN
Start: 2024-01-07 — End: 2024-01-07
  Administered 2024-01-07: 80 mg via INTRAVENOUS

## 2024-01-07 MED ORDER — CHLORHEXIDINE GLUCONATE CLOTH 2 % EX PADS
6.0000 | MEDICATED_PAD | Freq: Every day | CUTANEOUS | Status: DC
  Administered 2024-01-08 – 2024-01-23 (×19): 6 via TOPICAL

## 2024-01-07 MED ORDER — CHLORHEXIDINE GLUCONATE 0.12 % MT SOLN
OROMUCOSAL | Status: AC
Start: 2024-01-07 — End: 2024-01-08
  Filled 2024-01-07: qty 15

## 2024-01-07 MED ORDER — EPHEDRINE SULFATE-NACL 50-0.9 MG/10ML-% IV SOSY
PREFILLED_SYRINGE | INTRAVENOUS | Status: DC | PRN
Start: 2024-01-07 — End: 2024-01-07
  Administered 2024-01-07 (×5): 5 mg via INTRAVENOUS

## 2024-01-07 MED ORDER — ROCURONIUM BROMIDE 10 MG/ML (PF) SYRINGE
PREFILLED_SYRINGE | INTRAVENOUS | Status: DC | PRN
  Administered 2024-01-07: 20 mg via INTRAVENOUS
  Administered 2024-01-07: 60 mg via INTRAVENOUS

## 2024-01-07 MED ORDER — FENTANYL CITRATE (PF) 100 MCG/2ML IJ SOLN
INTRAMUSCULAR | Status: AC
Start: 2024-01-07 — End: 2024-01-07
  Filled 2024-01-07: qty 2

## 2024-01-07 MED ORDER — LACTATED RINGERS IV SOLN: INTRAVENOUS | Status: DC

## 2024-01-07 MED ORDER — SODIUM CHLORIDE 0.9 % IR SOLN
Status: DC | PRN
  Administered 2024-01-07 (×2): 1000 mL

## 2024-01-07 MED ORDER — POTASSIUM CHLORIDE 10 MEQ/100ML IV SOLN
10.0000 meq | INTRAVENOUS | Status: AC
  Administered 2024-01-07 (×2): 10 meq via INTRAVENOUS
  Filled 2024-01-07 (×3): qty 100

## 2024-01-07 MED ORDER — SODIUM CHLORIDE 0.9% FLUSH
10.0000 mL | Freq: Two times a day (BID) | INTRAVENOUS | Status: DC
  Administered 2024-01-08 – 2024-01-15 (×20): 10 mL
  Administered 2024-01-16 – 2024-01-17 (×2): 20 mL
  Administered 2024-01-17: 10 mL
  Administered 2024-01-18: 20 mL
  Administered 2024-01-18 – 2024-01-23 (×10): 10 mL

## 2024-01-07 MED ORDER — CEFAZOLIN SODIUM-DEXTROSE 2-4 GM/100ML-% IV SOLN
INTRAVENOUS | Status: AC
  Filled 2024-01-07: qty 100

## 2024-01-07 MED ORDER — BUPIVACAINE HCL (PF) 0.5 % IJ SOLN
INTRAMUSCULAR | Status: DC | PRN
  Administered 2024-01-07: 30 mL

## 2024-01-07 MED ORDER — PROPOFOL 10 MG/ML IV BOLUS
INTRAVENOUS | Status: DC | PRN
  Administered 2024-01-07: 150 mg via INTRAVENOUS

## 2024-01-07 MED ORDER — PROPOFOL 10 MG/ML IV BOLUS
INTRAVENOUS | Status: AC
  Filled 2024-01-07: qty 20

## 2024-01-07 SURGICAL SUPPLY — 40 items
BARRIER SKIN 2 1/4 (OSTOMY) ×2 IMPLANT
BARRIER SKIN OD2.25 2 3/4 FLNG (OSTOMY) IMPLANT
BENZOIN TINCTURE PRP APPL 2/3 (GAUZE/BANDAGES/DRESSINGS) IMPLANT
CHLORAPREP W/TINT 26 (MISCELLANEOUS) ×2 IMPLANT
CLAMP POUCH DRAINAGE QUIET (OSTOMY) IMPLANT
CLOTH BEACON ORANGE TIMEOUT ST (SAFETY) ×2 IMPLANT
COVER LIGHT HANDLE (MISCELLANEOUS) IMPLANT
DRAPE WARM FLUID 44X44 (DRAPES) ×2 IMPLANT
DRSG OPSITE POSTOP 4X10 (GAUZE/BANDAGES/DRESSINGS) IMPLANT
ELECTRODE REM PT RTRN 9FT ADLT (ELECTROSURGICAL) ×2 IMPLANT
GLOVE BIOGEL PI IND STRL 6.5 (GLOVE) ×2 IMPLANT
GLOVE BIOGEL PI IND STRL 7.0 (GLOVE) ×4 IMPLANT
GLOVE SURG SS PI 6.5 STRL IVOR (GLOVE) ×4 IMPLANT
GOWN STRL REUS W/TWL LRG LVL3 (GOWN DISPOSABLE) ×6 IMPLANT
HANDLE SUCTION POOLE (INSTRUMENTS) ×2 IMPLANT
INST SET MAJOR GENERAL (KITS) ×2 IMPLANT
KIT TURNOVER KIT A (KITS) ×2 IMPLANT
LIGASURE IMPACT 36 18CM CVD LR (INSTRUMENTS) IMPLANT
MANIFOLD NEPTUNE II (INSTRUMENTS) ×2 IMPLANT
NDL HYPO 21X1.5 SAFETY (NEEDLE) ×2 IMPLANT
NEEDLE HYPO 21X1.5 SAFETY (NEEDLE) ×2 IMPLANT
NS IRRIG 1000ML POUR BTL (IV SOLUTION) ×4 IMPLANT
PACK MAJOR ABDOMINAL (CUSTOM PROCEDURE TRAY) ×2 IMPLANT
PAD ARMBOARD POSITIONER FOAM (MISCELLANEOUS) ×2 IMPLANT
PENCIL SMOKE EVACUATOR COATED (MISCELLANEOUS) ×2 IMPLANT
POSITIONER HEAD 8X9X4 ADT (SOFTGOODS) ×2 IMPLANT
POUCH OSTOMY 2 PC DRNBL 2.75 (WOUND CARE) IMPLANT
RELOAD STAPLE 75 3.8 BLU REG (ENDOMECHANICALS) IMPLANT
RETRACTOR WND ALEXIS-O 25 LRG (MISCELLANEOUS) IMPLANT
SET BASIN LINEN APH (SET/KITS/TRAYS/PACK) ×2 IMPLANT
SPONGE T-LAP 18X18 ~~LOC~~+RFID (SPONGE) ×2 IMPLANT
STAPLER PROXIMATE 75MM BLUE (STAPLE) IMPLANT
STAPLER RELOADABLE 30 BLU REG (STAPLE) IMPLANT
STAPLER VISISTAT (STAPLE) ×2 IMPLANT
SUT CHROMIC 3 0 SH 27 (SUTURE) IMPLANT
SUT PDS AB CT VIOLET #0 27IN (SUTURE) ×4 IMPLANT
SUT SILK 3 0 SH CR/8 (SUTURE) ×2 IMPLANT
SWAB CULTURE ESWAB REG 1ML (MISCELLANEOUS) IMPLANT
SYR 30ML LL (SYRINGE) ×4 IMPLANT
TRAY FOLEY MTR SLVR 16FR STAT (SET/KITS/TRAYS/PACK) ×2 IMPLANT

## 2024-01-07 NOTE — Consult Note (Signed)
 WOC consult received for new end ileostomy placed by Dr. Cheree 01/07/2024.  WOC team will follow for ostomy education and support. First education session planned for Thursday 01/09/2024.   Thank you,    Powell Bar MSN, RN-BC, [employer]

## 2024-01-07 NOTE — Anesthesia Procedure Notes (Signed)
 Procedure Name: Intubation Date/Time: 01/07/2024 2:30 PM  Performed by: Cordella Elvie HERO, CRNAPre-anesthesia Checklist: Patient identified, Emergency Drugs available, Suction available, Patient being monitored and Timeout performed Patient Re-evaluated:Patient Re-evaluated prior to induction Oxygen Delivery Method: Circle system utilized Preoxygenation: Pre-oxygenation with 100% oxygen Induction Type: IV induction, Rapid sequence and Cricoid Pressure applied Ventilation: Mask ventilation without difficulty Laryngoscope Size: Mac and 3 Grade View: Grade I Tube type: Oral Tube size: 7.0 mm Number of attempts: 1 Airway Equipment and Method: Stylet Placement Confirmation: ETT inserted through vocal cords under direct vision, positive ETCO2, CO2 detector and breath sounds checked- equal and bilateral Secured at: 22 cm Tube secured with: Tape Dental Injury: Teeth and Oropharynx as per pre-operative assessment

## 2024-01-07 NOTE — Progress Notes (Signed)
 Pt with increased HR while ambulating to the bathroom. Pt HR up to 140s, alerted by tele. RN checked on pt, found patient in the bathroom. No distress noted. Pt stated she felt fine, asymptomatic. RN assisted pt back to bed. Pt sat for a few minutes, HR sat in the 120s for a few minutes, eventually settling in the 90s when pt laid down to rest. Opyd MD made aware of event.

## 2024-01-07 NOTE — Op Note (Signed)
 [facility] Operative Note  01/07/24  Preoperative Diagnosis: Small bowel obstruction, peritoneal carcinomatosis   Postoperative Diagnosis: Malignant small bowel obstruction, peritoneal carcinomatosis, frozen pelvis   Procedure(s) Performed: Exploratory laparotomy, lysis of adhesions, small bowel resection, creation of palliative end ileostomy   Surgeon: Dorothyann Brittle, DO    Assistants: Edra Sharps, RNFA   Anesthesia: General endotracheal   Anesthesiologist: Herschell Hollering, MD    Specimens: Small bowel, peritoneal nodule from colon   Estimated Blood Loss: 50 cc   Blood Replacement: None    Complications: None   Wound Class: Contaminated   Operative Indications: Patient is a 53 year old female who was admitted to the hospital with concern for small bowel obstruction.  She has a known history of peritoneal carcinomatosis for which she has been receiving chemotherapy treatments.  Her imaging was not significant for worsening peritoneal disease.  She had a bowel obstruction 2 months ago, with similar CT imaging findings, however CT from this admission is more concerning for worsening obstruction.  After long discussion with the patient, we have decided to proceed with surgery for exploration and attempt to alleviate bowel obstruction.  Patient is agreeable to surgery.  All risks and benefits of performing this procedure were discussed with the patient including pain, infection, bleeding, damage to the surrounding structures, and need for more procedures or surgery. The patient voiced understanding of the procedure, all questions were sought and answered, and consent was obtained.  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 General endotracheal anesthesia was induced. Intravenous antibiotics were administered per protocol.  An orogastric tube positioned to decompress the stomach.  The abdomen was prepared and draped in the usual sterile fashion.  A time-out was completed verifying correct patient, procedure, site, positioning, and implant(s) and/or special equipment prior to beginning this procedure.  A vertical midline incision was made from just above the umbilicus to below the umbilicus.  This was deepened through the subcutaneous tissues, and hemostasis was achieved with electrocautery.  The linea alba was identified, and incised, and the peritoneal cavity was entered just below a known umbilical/ventral hernia.  Upon entering the abdomen serous fluid was encountered.  Patient had significant peritoneal studding across the abdominal wall, small bowel, colon, mesentery, and within the pelvis.  There were significant intra-abdominal adhesions related to the carcinomatosis.  The small bowel was eviscerated, and adhesions to itself and surrounding structures were lysed.  Small bowel was then run down towards the pelvis.  Upon inspection in the pelvis, everything was adherent to itself and the abdominal wall with a thick rind of scar tissue versus cancer, and the pelvis was frozen.  Attempts were made to bluntly dissect the small bowel from this area, however the adhesions were too thick and anatomical structures were unable to be delineated.  Concern for risk to injury to the bowel and other structures outweighed the benefit of trying to take down these adhesions.  The  small bowel was then run from the pelvis up towards the ligament of Treitz.  There was at least  170 cm of small bowel that was able to be measured.  Decision was made to perform an end ileostomy, as a loop ileostomy was unlikely to reach the abdominal wall given the significant adhesions within the mesentery with peritoneal studding.  Dr. Kallie was called after inspection of the abdomen, and the case was discussed.  Dr. Kallie agreed that the best decision for the patient would be to create a palliative end ostomy.   An area of distalmost small bowel (ileum) was selected, and a mesenteric defect was created.  A linear cutting stapler was inserted and fired.  The mesentery was partially taken down to release the small bowel from the adhesed mesentery.  This end of ileum was not able to reach the abdominal wall without tension given the significant scarring in the peritoneum.  An area of less tension was noted about 7 cm more proximally.  A new mesenteric defect was created at this area with less tension and a linear cutting stapler was inserted and fired.  The mesentery of the small bowel was taken down with LigaSure.  Small bowel was sent to pathology for evaluation.  The mesentery was partially taken down with LigaSure and scored on either side to allow for more mobility.  This end appeared to reach the abdominal wall without tension.    A 2 cm circle of skin was excised in the right side of the abdomen.  Dissection was carried down to the anterior fascia, where a 3 cm cruciate incision was made.  The rectus muscle was split in the directions of its fibers.  The posterior sheath and peritoneum were similarly incised in a cruciate fashion.  A Babcock clamp was used to guide the ileum through the opening.  The ileum was carefully inspected and found to lie at the abdominal wall without tension or twist in the mesentery.  Hemostasis was adequate.  Attention was turned back to the abdominal cavity.  A  peritoneal nodule was removed from the anterior abdominal wall to send to pathology for further evaluation.  Upon removal of this area, it was noted that this was an area of colon that was intimately adhered to the anterior abdominal wall and appeared to be peritoneum.  The edges of the defect and the colon were grasped, and the hole was closed with a TA stapler.  The staple line was then oversewn with 3 oh silks in a Lembert fashion.  There was no evidence of any leakage from this area.  The abdomen was then irrigated with warm saline.  Hemostasis was noted.  The fascia was closed with 0 PDS in a running fashion from both the superior and inferior aspects of the incision.  The subcutaneous tissues were irrigated, and Marcaine  was instilled.  The skin was closed with skin staples.   Attention was turned back to the ileostomy site.  The staple line of the terminal ileum was excised, and demonstrated excellent blood flow to the cut edge of bowel.  Hemostasis was achieved with electrocautery at this cut line.  The ileostomy was then everted with 3-0 chromic sutures in a Brooke fashion.  Hemostasis was noted.  An ostomy appliance was placed.  A honeycomb dressing was a placed along the midline incision.  Final inspection revealed acceptable hemostasis. All counts were correct at the end of the case. The patient was awakened from anesthesia and extubated without complication.  The patient went to the PACU in stable condition.   Dorothyann Brittle, DO  [facility] [address] [place] [address], [name] [address] [phone] (office)

## 2024-01-07 NOTE — Consult Note (Signed)
 [facility] Consult  Reason for Consult: Recurrent small bowel obstruction Referring Physician: Dr. Garrick  Chief Complaint   Abdominal Pain     HPI: Nicole Santos is a 53 y.o. female who presents to the hospital with a 2-day worsening history of abdominal pain, nausea, and vomiting.  She was previously seen by me in June of this year for a partial small bowel obstruction.  This bowel obstruction was managed conservatively with NG tube and small bowel obstruction protocol which demonstrated resolution of her obstruction.  Since being discharged from the hospital, she was initially doing okay, but states that gradually her abdominal pain started to worsen.  She has had persistent issues with nausea and vomiting secondary to acid in her stomach.  She was advised to maintain a soft diet.  Starting on Saturday, she had large-volume emesis and was unable to tolerate any solid food.  When her pain failed to improve, she presented to the emergency department.  Her past medical history is significant for peritoneal carcinomatosis, hypertension, diabetes, and hyperlipidemia.  Her surgical history is significant for diagnostic laparoscopy by Dr. Mavis in 2023, Port-A-Cath insertion, and omentectomy.  She is currently on Eliquis  and her last dose was received on Sunday evening.  Upon evaluation in the emergency department, she was noted to be tachycardic.  She underwent a CT of the abdomen and pelvis which demonstrated multiple persistent fluid-filled dilated small bowel loops measuring up to 4.8 cm with slight interval worsening since previous imaging, appears to be due to to a distal obstruction with a transition point likely overlying the midline pelvis likely due to adhesions.  There is also evidence of small anterior peritoneal implants with several small masses within the subcutaneous fat of her lower abdominal/pelvic wall which are new and could be sites of metastatic disease.  I  recommended NG tube placement, but this was deferred as patient had not had any episodes of nausea or vomiting in 2 days.    This morning, patient still has persistent lower abdominal pain, but it is slightly improved from when she first presented to the hospital.  She last had a bowel movement on Sunday and last passed gas yesterday.  She is concerned proceeding with nonoperative management, as she feels that this obstruction will persist in the future.  Past Medical History:  Diagnosis Date   Acute pulmonary embolism (HCC) 03/06/2022   Acute respiratory disease due to COVID-19 virus 02/02/2019   Anxiety    Cancer (HCC)    Depression    Dyspnea    Dysrhythmia    Family history of bladder cancer    Family history of breast cancer    History of diabetes mellitus    Resolved in Sept 2023 with weight loss.   Hypertension    Ovarian cancer (HCC) 11/14/2021   Peritoneal carcinomatosis (HCC)    Pneumonia    Sleep apnea    doesn't use CPAP   Tachycardia     Past Surgical History:  Procedure Laterality Date   KNEE SURGERY Right    Arthroscopic   LAPAROSCOPIC ABDOMINAL EXPLORATION N/A 11/13/2021   Procedure: Exploratory laparoscopy;  Surgeon: Mavis Anes, MD;  Location: [facility];  Service: General;  Laterality: N/A;   PORTACATH PLACEMENT Left 11/15/2021   Procedure: INSERTION PORT-A-CATH;  Surgeon: Mavis Anes, MD;  Location: [facility];  Service: General;  Laterality: Left;   TONSILLECTOMY AND ADENOIDECTOMY      Family History  Problem Relation Age of Onset   Hypertension  Mother    Heart attack Paternal Uncle    Bladder Cancer Maternal Grandmother 92       non-smoker   Dementia Maternal Grandfather    Breast cancer Maternal Great-grandmother        MGF's mother   Colon cancer Neg Hx    Ovarian cancer Neg Hx    Endometrial cancer Neg Hx    Pancreatic cancer Neg Hx    Prostate cancer Neg Hx     Social History   Tobacco Use   Smoking status: Never   Smokeless tobacco: Never   Vaping Use   Vaping status: Never Used  Substance Use Topics   Alcohol use: No   Drug use: No    Medications: I have reviewed the patient's current medications.  Allergies  Allergen Reactions   Augmentin  [Amoxicillin -Pot Clavulanate] Diarrhea and Nausea And Vomiting   Doxycycline Other (See Comments)    Abdominal cramps.   Topamax  [Topiramate ] Nausea And Vomiting     ROS:  Pertinent items are noted in HPI.  Blood pressure 120/69, pulse 97, temperature 97.9 F (36.6 C), temperature source Oral, resp. rate 20, height 5' 2 (1.575 m), weight 79.9 kg, SpO2 99%. Physical Exam Vitals reviewed.  Constitutional:      Appearance: She is well-developed.  HENT:     Head: Normocephalic and atraumatic.  Eyes:     Extraocular Movements: Extraocular movements intact.     Pupils: Pupils are equal, round, and reactive to light.  Cardiovascular:     Rate and Rhythm: Tachycardia present.  Pulmonary:     Effort: Pulmonary effort is normal.  Abdominal:     Comments: Abdomen soft, nondistended, no percussion tenderness, mild lower abdominal tenderness to palpation; no rigidity, guarding, rebound tenderness; soft and nontender ventral hernia  Skin:    General: Skin is warm and dry.  Neurological:     General: No focal deficit present.     Mental Status: She is oriented to person, place, and time.  Psychiatric:        Mood and Affect: Mood normal.        Behavior: Behavior normal.     Results: Results for orders placed or performed during the hospital encounter of 01/06/24 (from the past 48 hours)  Comprehensive metabolic panel     Status: Abnormal   Collection Time: 01/06/24  1:50 PM  Result Value Ref Range   Sodium 138 135 - 145 mmol/L   Potassium 4.2 3.5 - 5.1 mmol/L   Chloride 99 98 - 111 mmol/L   CO2 21 (L) 22 - 32 mmol/L   Glucose, Bld 95 70 - 99 mg/dL    Comment: Glucose reference range applies only to samples taken after fasting for at least 8 hours.   BUN 15 6 - 20  mg/dL   Creatinine, Ser 9.17 0.44 - 1.00 mg/dL   Calcium  9.3 8.9 - 10.3 mg/dL   Total Protein 6.7 6.5 - 8.1 g/dL   Albumin 3.3 (L) 3.5 - 5.0 g/dL   AST 18 15 - 41 U/L   ALT 9 0 - 44 U/L   Alkaline Phosphatase 60 38 - 126 U/L   Total Bilirubin 2.5 (H) 0.0 - 1.2 mg/dL   GFR, Estimated >39 >39 mL/min    Comment: (NOTE) Calculated using the CKD-EPI Creatinine Equation (2021)    Anion gap 18 (H) 5 - 15    Comment: Performed at [facility], [address], [place], [name] [address]  Lipase, blood  Status: None   Collection Time: 01/06/24  1:50 PM  Result Value Ref Range   Lipase 24 11 - 51 U/L    Comment: Performed at [facility], [address] [place], [name] [address]  CBC with Diff     Status: Abnormal   Collection Time: 01/06/24  1:50 PM  Result Value Ref Range   WBC 5.3 4.0 - 10.5 K/uL   RBC 2.84 (L) 3.87 - 5.11 MIL/uL   Hemoglobin 10.3 (L) 12.0 - 15.0 g/dL   HCT 68.9 (L) 63.9 - 53.9 %   MCV 109.2 (H) 80.0 - 100.0 fL   MCH 36.3 (H) 26.0 - 34.0 pg   MCHC 33.2 30.0 - 36.0 g/dL   RDW 75.7 (H) 88.4 - 84.4 %   Platelets 138 (L) 150 - 400 K/uL   nRBC 2.1 (H) 0.0 - 0.2 %   Neutrophils Relative % 76 %   Neutro Abs 4.0 1.7 - 7.7 K/uL   Lymphocytes Relative 13 %   Lymphs Abs 0.7 0.7 - 4.0 K/uL   Monocytes Relative 9 %   Monocytes Absolute 0.5 0.1 - 1.0 K/uL   Eosinophils Relative 0 %   Eosinophils Absolute 0.0 0.0 - 0.5 K/uL   Basophils Relative 1 %   Basophils Absolute 0.0 0.0 - 0.1 K/uL   WBC Morphology MORPHOLOGY UNREMARKABLE    RBC Morphology See Note     Comment:  anisocytosis   Smear Review Normal platelet morphology    Immature Granulocytes 1 %   Abs Immature Granulocytes 0.03 0.00 - 0.07 K/uL    Comment: Performed at [facility], [address], [place], [name] [address]  Urinalysis, Routine w reflex microscopic -Urine, Clean Catch     Status: Abnormal   Collection Time: 01/06/24  4:51 PM  Result Value Ref Range   Color, Urine YELLOW YELLOW   APPearance HAZY  (A) CLEAR   Specific Gravity, Urine <1.005 (L) 1.005 - 1.030   pH 6.0 5.0 - 8.0   Glucose, UA NEGATIVE NEGATIVE mg/dL   Hgb urine dipstick MODERATE (A) NEGATIVE   Bilirubin Urine NEGATIVE NEGATIVE   Ketones, ur 80 (A) NEGATIVE mg/dL   Protein, ur 30 (A) NEGATIVE mg/dL   Nitrite NEGATIVE NEGATIVE   Leukocytes,Ua SMALL (A) NEGATIVE   RBC / HPF 11-20 0 - 5 RBC/hpf   WBC, UA 21-50 0 - 5 WBC/hpf   Bacteria, UA NONE SEEN NONE SEEN   Squamous Epithelial / HPF 6-10 0 - 5 /HPF   Mucus PRESENT     Comment: Performed at [facility], [address] [place], [name] [address]  Basic metabolic panel     Status: Abnormal   Collection Time: 01/07/24  5:02 AM  Result Value Ref Range   Sodium 141 135 - 145 mmol/L   Potassium 3.4 (L) 3.5 - 5.1 mmol/L   Chloride 105 98 - 111 mmol/L   CO2 24 22 - 32 mmol/L   Glucose, Bld 111 (H) 70 - 99 mg/dL    Comment: Glucose reference range applies only to samples taken after fasting for at least 8 hours.   BUN 13 6 - 20 mg/dL   Creatinine, Ser 9.26 0.44 - 1.00 mg/dL   Calcium  8.4 (L) 8.9 - 10.3 mg/dL   GFR, Estimated >39 >39 mL/min    Comment: (NOTE) Calculated using the CKD-EPI Creatinine Equation (2021)    Anion gap 12 5 - 15    Comment: Performed at [facility], [address], [place],  [name] [address]  CBC     Status: Abnormal   Collection Time: 01/07/24  5:02 AM  Result Value Ref Range   WBC 4.3 4.0 - 10.5 K/uL   RBC 2.48 (L) 3.87 - 5.11 MIL/uL   Hemoglobin 9.1 (L) 12.0 - 15.0 g/dL   HCT 71.8 (L) 63.9 - 53.9 %   MCV 113.3 (H) 80.0 - 100.0 fL   MCH 36.7 (H) 26.0 - 34.0 pg   MCHC 32.4 30.0 - 36.0 g/dL   RDW 75.7 (H) 88.4 - 84.4 %   Platelets 116 (L) 150 - 400 K/uL   nRBC 1.4 (H) 0.0 - 0.2 %    Comment: Performed at [facility], [address], [place], [name] [address]    CT ABDOMEN PELVIS W CONTRAST Result Date: 01/06/2024 CLINICAL DATA:  Abdominal pain with nausea and vomiting two days ago. History of ovarian cancer. EXAM: CT ABDOMEN AND  PELVIS WITH CONTRAST TECHNIQUE: Multidetector CT imaging of the abdomen and pelvis was performed using the standard protocol following bolus administration of intravenous contrast. RADIATION DOSE REDUCTION: This exam was performed according to the departmental dose-optimization program which includes automated exposure control, adjustment of the mA and/or kV according to patient size and/or use of iterative reconstruction technique. CONTRAST:  OMNIPAQUE  IOHEXOL  300 MG/ML  SOLN COMPARISON:  11/04/2023 FINDINGS: Lower chest: Heart is normal size.  Lung bases are clear. Hepatobiliary: Liver and biliary tree are normal. There is moderate cholelithiasis. Pancreas: Normal. Spleen: Normal. Adrenals/Urinary Tract: Adrenal glands are normal. Kidneys are normal in size without hydronephrosis or nephrolithiasis. Stable left renal cyst. Ureters are normal. Mild hazy bladder wall thickening circumferentially unchanged. Stomach/Bowel: Stomach is normal. Evidence of multiple persistent fluid-filled dilated small bowel loops measuring up to 4.8 cm in diameter with slight interval worsening. This appears to be due to a distal obstruction with transition point likely over the midline pelvis involving an ileal loop. Air is present throughout the colon. Appendix not definitely visualized. Vascular/Lymphatic: Abdominal aorta is normal in caliber with minimal calcified plaque distally. Remaining vascular structures are unremarkable. No adenopathy. Reproductive: Small ill-defined uterus containing peripherally enhancing 2.1 cm mass likely uterine fibroid as these findings are unchanged. Adnexal regions unremarkable. Other: Worsening mild free fluid over the upper abdomen with minimal free fluid adjacent the bladder. Acute evidence of small anterior peritoneal implants without significant change several small masses within the subcutaneous fat of the anterior lower abdominal/pelvic wall which are new as cannot exclude metastatic  disease. Small ventral hernia right of midline over the mid to upper abdomen unchanged and containing only peritoneal fat. Periumbilical hernia less prominent containing partial segment of small bowel. Musculoskeletal: No focal abnormality. IMPRESSION: 1. Evidence of multiple persistent fluid-filled dilated small bowel loops measuring up to 4.8 cm in diameter with slight interval worsening. This appears to be due to a distal obstruction with transition point likely over the midline pelvis involving an ileal loop likely due to adhesions. 2. Worsening mild free fluid over the upper abdomen with minimal stable free fluid adjacent the bladder. 3. Evidence of small anterior peritoneal implants without significant change. Several small masses within the subcutaneous fat of the anterior lower abdominal/pelvic wall which are new and may be additional sites of metastatic disease in this patient with known ovarian cancer. 4. Cholelithiasis. 5. Stable left renal cyst. 6. Small ventral hernia right of midline over the mid to upper abdomen unchanged and containing only peritoneal fat. Periumbilical hernia less prominent containing partial segment of small bowel.  7. Aortic atherosclerosis. Aortic Atherosclerosis (ICD10-I70.0). Electronically Signed   By: Toribio Agreste M.D.   On: 01/06/2024 16:09     Assessment & Plan:  MERCED BROUGHAM is a 53 y.o. female who was admitted with recurrent small bowel obstruction.  Imaging and blood work evaluated by myself.  -We had a long discussion about her options related to management of her obstruction.  I explained that her obstruction appears to be slightly worsened compared to the imaging from her last admission, and I am fearful that nonoperative managements may not alleviate this current obstruction.  Patient is concerned that nonoperative management will also not alleviate her obstruction, especially given that she is being readmitted for the same issue within 2 months. -We further  discussed that while we can proceed with surgery, there is a chance that surgery will not be able to alleviate the obstruction if the obstruction is secondary to cancer and not adhesions or other scar tissue -The risk and benefits of exploratory laparotomy, lysis of adhesions, possible bowel resection were discussed including but not limited to bleeding, infection, injury to surrounding structures, need for additional procedures, poor wound healing, recurrent bowel obstructions, and inability to alleviate bowel obstruction.  After careful consideration, Nicole Santos has decided to proceed with surgery. -Will plan for surgery today -NPO -Discussed that she will likely have an NG tube in place postoperatively, which will remain in place until she has return of bowel function -Prophylactic Ancef  -IVF per primary team -PRN pain control and antiemetics -Further recommendations to follow surgery  All questions were answered to the satisfaction of the patient.  Note: Portions of this report may have been transcribed using voice recognition software. Every effort has been made to ensure accuracy; however, inadvertent computerized transcription errors may still be present.   -- Dorothyann Brittle, DO [facility] [address] [place], [name] [address] [phone] (office)

## 2024-01-07 NOTE — Progress Notes (Signed)
 [facility]  Spoke with the patient's husband on the phone.  I explained that she tolerated the procedure without difficulty.  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  I was unable to release any of her small bowel that was stuck down in her pelvis.  For this reason, I created an end ileostomy for her which is palliative.  I further explained that she has significant peritoneal carcinomatosis with nodules throughout her abdomen.  We will see how she does in the postoperative phase, but goals of care will need to be addressed with palliative and oncology for her.  She is at high risk for poor wound healing and recurrent bowel obstruction in the future.  All questions were answered to his expressed satisfaction.  Plan: -Return to bed on the floor -Chest x-ray to confirm NG tube location -Once NG tube location confirmed, placed to low intermittent suction -NPO -IVF -PRN and scheduled pain medications ordered -Monitor for ileostomy output -Ostomy nurse consulted for new end ileostomy -Maintain foley catheter at this time -Will plan to consult palliative and potentially oncology -Dr. Ricky updated -Appreciate hospitalist recommendations  Dorothyann Brittle, DO [facility] [address] [place], [name] [address] [phone] (office)

## 2024-01-07 NOTE — TOC CM/SW Note (Signed)
 Transition of Care [facility]) - Inpatient Brief Assessment   Patient Details  Name: Nicole Santos MRN: [id number] Date of Birth: 09-29-1970  Transition of Care [facility]) CM/SW Contact:    Lucie Lunger, LCSWA Phone Number: 01/07/2024, 9:41 AM  Clinical Narrative: Transition of Care Department [facility]) has reviewed patient and no TOC needs have been identified at this time. We will continue to monitor patient advancement through interdiciplinary progression rounds. If new patient transition needs arise, please place a TOC consult.  Transition of Care Asessment: Insurance and Status: Insurance coverage has been reviewed Patient has primary care physician: Yes Home environment has been reviewed: From home Prior level of function:: Independent Prior/Current Home Services: No current home services Social Drivers of Health Review: SDOH reviewed no interventions necessary Readmission risk has been reviewed: Yes Transition of care needs: no transition of care needs at this time

## 2024-01-07 NOTE — Transfer of Care (Signed)
 Immediate Anesthesia Transfer of Care Note  Patient: Nicole Santos  Procedure(s) Performed: LAPAROTOMY, EXPLORATORY, LYSIS OF ADHESIONS (Abdomen) CREATION, PALLIATIVE ILEOSTOMY (Abdomen) SMALL BOWEL RESECTION (Abdomen)  Patient Location: PACU  Anesthesia Type:General  Level of Consciousness: awake, sedated, and patient cooperative  Airway & Oxygen Therapy: Patient Spontanous Breathing  Post-op Assessment: Report given to RN and Post -op Vital signs reviewed and stable  Post vital signs: Reviewed and stable  Last Vitals:  Vitals Value Taken Time  BP 102/77 01/07/24 17:45  Temp 36.6 C 01/07/24 17:40  Pulse 104 01/07/24 17:52  Resp 15 01/07/24 17:52  SpO2 98 % 01/07/24 17:52  Vitals shown include unfiled device data.  Last Pain:  Vitals:   01/07/24 1740  TempSrc:   PainSc: Asleep         Complications: No notable events documented.

## 2024-01-07 NOTE — Progress Notes (Signed)
 Progress Note   Patient: Nicole Santos FMW:[id number] DOB: Oct 27, 1970 DOA: 01/06/2024     0 DOS: the patient was seen and examined on 01/07/2024   Brief hospital admission narrative course: As per H&P written by Dr. Pearlean on 01/06/2024 Nicole Santos is a 53 y.o. female with medical history significant for ovarian cancer, peritoneal carcinomatosis, pulmonary embolism, hypertension. Patient presented to the ED with complaints of worsening of her chronic abdominal pain that started 3 days ago, with vomiting.  She has been compliant with soft diet as recommended by her providers, but she reports persistent pain with oral intake.   Last hospitalization 11/04/2023 through 11/07/2023 with partial small bowel obstruction-managed conservatively   ED Course: Initial tachycardia heart rate up to 127 now improved. CT [facility] with contrast-suggest small bowel obstruction. EDP talked to general surgeon Dr. Evonnie, will see in consult, can hold NG tube as patient has not had any vomiting in 2 days.  Assessment and plan 1-a small bowel obstruction - Patient with underlying history of ovarian cancer with peritoneal carcinomatosis - Transition point appreciated on CT scan - Currently reports no active nausea vomiting but complaining of ongoing abdominal pain. - Continue supportive care, fluid resuscitation, analgesics, as needed antiemetics and electrolyte repletion. - Will follow general surgery recommendations and further care; given patient history and prior hospitalization with similar ongoing problem might ended requiring surgical repair.  Essential hypertension - Stable overall - Continue to follow vital signs and resume oral antihypertensive agents when taking p.o.'s.  Hyperlipidemia - Planning to resume statin when able to tolerate by mouth.  Pulmonary embolism (HCC) -Continue holding Eliquis  in case of surgical repair required - Continue treatment with Lovenox   Primary peritoneal carcinomatosis  (HCC) -Continue outpatient follow-up [facility] Dr. [facility]ragadda[facility]    Chronic thrombocytopenia - Appears to be stable - No prior bleeding appreciated - Continue to follow trend.  Class I obesity -Body mass index is 32.22 kg/m.  -Low-calorie diet, portion control discussed with patient.  Subjective:  Reporting ongoing abdominal pain; expressed no nausea or vomiting at time of examination.  Patient is afebrile.  Hemodynamically stable.  Physical Exam: Vitals:   01/06/24 1924 01/07/24 0059 01/07/24 0407 01/07/24 0839  BP: 123/67 137/65 123/67 120/69  Pulse: 83 (!) 101 (!) 103 97  Resp: 18 18 18 20   Temp: 97.7 F (36.5 C) 97.7 F (36.5 C) 97.8 F (36.6 C) 97.9 F (36.6 C)  TempSrc: Oral Oral Oral Oral  SpO2: 99% 95% 98% 99%  Weight: 79.9 kg     Height:       General exam: Alert, awake, oriented x 3; in no major distress. Respiratory system: Good saturation on room air. Cardiovascular system:RRR. No murmurs, rubs, gallops. Gastrointestinal system: Abdomen is soft, with decreased to absent bowel sounds and expressing tenderness to palpation in her left mid to lower quadrant.   Central nervous system: No focal neurological deficits. Extremities: No cyanosis or clubbing. Skin: No petechiae. Psychiatry: Judgement and insight appear normal. Mood & affect appropriate.    Data Reviewed: Basic metabolic panel: Sodium 141, potassium 3.4, chloride 105, bicarb 24, BUN 13, creatinine 0.73 and GFR >60 CBC: WBCs 4.3, hemoglobin 9.1 and platelet counts 116K  Family Communication: No family at bedside.  Disposition: Status is: Observation The patient will require care spanning > 2 midnights and should be moved to inpatient because: Continue IV therapy, electrolyte repletion and anticipate longer hospitalization while pursuing surgical intervention for SBO.  Anticipating discharge back  home once medically stable.  Time spent: 50 minutes  Author: Eric Nunnery,  MD 01/07/2024 1:02 PM  For on call review [email or web address].

## 2024-01-07 NOTE — Brief Op Note (Signed)
 01/07/2024  5:28 PM  PATIENT:  Nicole Santos  53 y.o. female  PRE-OPERATIVE DIAGNOSIS:  small bowel obstruction, peritoneal carcinomatosis  POST-OPERATIVE DIAGNOSIS:  malignant small bowel obstruction, peritoneal carcinomatosis  PROCEDURE:  Procedure(s): LAPAROTOMY, EXPLORATORY, LYSIS OF ADHESIONS (N/A) CREATION, PALLIATIVE ILEOSTOMY SMALL BOWEL RESECTION  SURGEON:  Surgeons and Role:    * Kameran Mcneese, Dorothyann LABOR, DO - Primary  ASSISTANTS: Edra Sharps, RNFA   ANESTHESIA:   general  EBL:  50 mL   BLOOD ADMINISTERED:none  DRAINS: none   LOCAL MEDICATIONS USED:  MARCAINE      SPECIMEN:  Source of Specimen:  small bowel resection, peritoneal nodule from colon, cytology sent for peritoneal fluid  DISPOSITION OF SPECIMEN:  PATHOLOGY  COUNTS:  YES  DICTATION: .Note written in EPIC  PLAN OF CARE: Admit to inpatient   PATIENT DISPOSITION:  PACU - hemodynamically stable.   Delay start of Pharmacological VTE agent (>24hrs) due to surgical blood loss or risk of bleeding: no  Dorothyann Brittle, DO [facility] [address] [place], [name] [address] [phone] (office)

## 2024-01-07 NOTE — Anesthesia Preprocedure Evaluation (Addendum)
 Anesthesia Evaluation  Patient identified by MRN, date of birth, ID band Patient awake    Reviewed: Allergy & Precautions, H&P , NPO status , Patient's Chart, lab work & pertinent test results  Airway Mallampati: I  TM Distance: >3 FB Neck ROM: Full    Dental no notable dental hx.    Pulmonary shortness of breath, sleep apnea , pneumonia   Pulmonary exam normal breath sounds clear to auscultation       Cardiovascular hypertension, Normal cardiovascular exam+ dysrhythmias  Rhythm:Regular Rate:Normal     Neuro/Psych  PSYCHIATRIC DISORDERS Anxiety Depression     Neuromuscular disease    GI/Hepatic negative GI ROS, Neg liver ROS,,,  Endo/Other  negative endocrine ROS    Renal/GU Renal disease  negative genitourinary   Musculoskeletal negative musculoskeletal ROS (+)    Abdominal   Peds negative pediatric ROS (+)  Hematology  (+) Blood dyscrasia, anemia   Anesthesia Other Findings   Reproductive/Obstetrics negative OB ROS                              Anesthesia Physical Anesthesia Plan  ASA: 3 and emergent  Anesthesia Plan: General   Post-op Pain Management:    Induction: Rapid sequence and Intravenous  PONV Risk Score and Plan:   Airway Management Planned: Oral ETT  Additional Equipment:   Intra-op Plan:   Post-operative Plan: Extubation in OR  Informed Consent: I have reviewed the patients History and Physical, chart, labs and discussed the procedure including the risks, benefits and alternatives for the proposed anesthesia with the patient or authorized representative who has indicated his/her understanding and acceptance.     Dental advisory given  Plan Discussed with: CRNA  Anesthesia Plan Comments:          Anesthesia Quick Evaluation

## 2024-01-08 ENCOUNTER — Encounter (HOSPITAL_COMMUNITY): Payer: Self-pay | Admitting: Surgery

## 2024-01-08 DIAGNOSIS — Z7189 Other specified counseling: Secondary | ICD-10-CM | POA: Diagnosis not present

## 2024-01-08 DIAGNOSIS — G8918 Other acute postprocedural pain: Secondary | ICD-10-CM

## 2024-01-08 DIAGNOSIS — K56609 Unspecified intestinal obstruction, unspecified as to partial versus complete obstruction: Secondary | ICD-10-CM | POA: Diagnosis not present

## 2024-01-08 DIAGNOSIS — C482 Malignant neoplasm of peritoneum, unspecified: Secondary | ICD-10-CM | POA: Diagnosis not present

## 2024-01-08 DIAGNOSIS — Z515 Encounter for palliative care: Secondary | ICD-10-CM | POA: Diagnosis not present

## 2024-01-08 LAB — BASIC METABOLIC PANEL WITH GFR
Anion gap: 12 (ref 5–15)
BUN: 11 mg/dL (ref 6–20)
CO2: 22 mmol/L (ref 22–32)
Calcium: 8.3 mg/dL — ABNORMAL LOW (ref 8.9–10.3)
Chloride: 108 mmol/L (ref 98–111)
Creatinine, Ser: 0.74 mg/dL (ref 0.44–1.00)
GFR, Estimated: >60 mL/min (ref >60)
Glucose, Bld: 215 mg/dL — ABNORMAL HIGH (ref 70–99)
Potassium: 5 mmol/L (ref 3.5–5.1)
Sodium: 142 mmol/L (ref 135–145)

## 2024-01-08 LAB — CBC
HCT: 29.4 % — ABNORMAL LOW (ref 36.0–46.0)
Hemoglobin: 9.4 g/dL — ABNORMAL LOW (ref 12.0–15.0)
MCH: 37 pg — ABNORMAL HIGH (ref 26.0–34.0)
MCHC: 32 g/dL (ref 30.0–36.0)
MCV: 115.7 fL — ABNORMAL HIGH (ref 80.0–100.0)
Platelets: 121 K/uL — ABNORMAL LOW (ref 150–400)
RBC: 2.54 MIL/uL — ABNORMAL LOW (ref 3.87–5.11)
RDW: 24.7 % — ABNORMAL HIGH (ref 11.5–15.5)
WBC: 8 K/uL (ref 4.0–10.5)
nRBC: 0.9 % — ABNORMAL HIGH (ref 0.0–0.2)

## 2024-01-08 LAB — MAGNESIUM: Magnesium: 1.7 mg/dL (ref 1.7–2.4)

## 2024-01-08 MED ORDER — PANTOPRAZOLE SODIUM 40 MG IV SOLR
40.0000 mg | Freq: Two times a day (BID) | INTRAVENOUS | Status: DC
  Administered 2024-01-08: 40 mg via INTRAVENOUS
  Filled 2024-01-08: qty 10

## 2024-01-08 MED ORDER — METOCLOPRAMIDE HCL 5 MG/ML IJ SOLN
10.0000 mg | Freq: Three times a day (TID) | INTRAMUSCULAR | Status: DC
  Administered 2024-01-08 – 2024-01-11 (×8): 10 mg via INTRAVENOUS
  Filled 2024-01-08 (×9): qty 2

## 2024-01-08 MED ORDER — HYDROMORPHONE HCL 1 MG/ML IJ SOLN
0.5000 mg | INTRAMUSCULAR | Status: DC | PRN
  Administered 2024-01-08 – 2024-01-20 (×38): 0.5 mg via INTRAVENOUS
  Filled 2024-01-08 (×33): qty 0.5

## 2024-01-08 NOTE — Plan of Care (Signed)
Problem: Nutrition: Goal: Adequate nutrition will be maintained Outcome: Not Progressing NPO at this time     

## 2024-01-08 NOTE — Anesthesia Postprocedure Evaluation (Signed)
 Anesthesia Post Note  Patient: Nicole Santos  Procedure(s) Performed: LAPAROTOMY, EXPLORATORY, LYSIS OF ADHESIONS (Abdomen) CREATION, PALLIATIVE ILEOSTOMY (Abdomen) SMALL BOWEL RESECTION (Abdomen)  Patient location during evaluation: PACU Anesthesia Type: General Level of consciousness: awake and alert Pain management: pain level controlled Vital Signs Assessment: post-procedure vital signs reviewed and stable Respiratory status: spontaneous breathing, nonlabored ventilation, respiratory function stable and patient connected to nasal cannula oxygen Cardiovascular status: blood pressure returned to baseline and stable Postop Assessment: no apparent nausea or vomiting Anesthetic complications: no   No notable events documented.   Last Vitals:  Vitals:   01/08/24 0036 01/08/24 0447  BP: 126/78 (!) 140/67  Pulse: (!) 110 (!) 110  Resp: 17 17  Temp: 36.7 C 36.5 C  SpO2: 97% 99%    Last Pain:  Vitals:   01/08/24 0511  TempSrc:   PainSc: 9                  Andrea Limes

## 2024-01-08 NOTE — TOC Progression Note (Signed)
 Transition of Care [facility]) - Progression Note    Patient Details  Name: Nicole Santos MRN: [id number] Date of Birth: April 21, 1971  [facility] CM/SW Contact  Lucie Lunger, [place] Phone Number: 01/08/2024, 1:30 PM  Clinical Narrative:    CSW consulted by palliative NP for outpatient palliative referral being needed. CSW spoke to [place] with [facility] who states they will work on referral for palliative services and reach out to pt/family once discharged. TOC to follow.    Barriers to Discharge: Continued Medical Work up               Expected Discharge Plan and Services                                               Social Drivers of Health (SDOH) Interventions SDOH Screenings   Food Insecurity: No Food Insecurity (01/06/2024)  Housing: Low Risk  (01/06/2024)  Transportation Needs: No Transportation Needs (01/06/2024)  Utilities: Not At Risk (01/06/2024)  Depression (PHQ2-9): Low Risk  (01/01/2024)  Tobacco Use: Low Risk  (01/07/2024)    Readmission Risk Interventions    11/06/2023    9:51 AM  Readmission Risk Prevention Plan  Transportation Screening Complete  Home Care Screening Complete  Medication Review (RN CM) Complete

## 2024-01-08 NOTE — Progress Notes (Signed)
 [facility] Progress Note  1 Day Post-Op  Subjective: Patient seen and examined.  She is resting comfortably in bed.  She complains of abdominal soreness when she tries to move.  Her NG tube remains in place with minimal bilious output in the canister.  She had low urine output overnight with only 280 cc, however urine appears slightly concentrated with 200 cc currently in Foley bag.  Objective: Vital signs in last 24 hours: Temp:  [97.7 F (36.5 C)-98 F (36.7 C)] 97.7 F (36.5 C) (08/06 0447) Pulse Rate:  [94-110] 110 (08/06 0447) Resp:  [12-20] 17 (08/06 0447) BP: (79-153)/(41-79) 140/67 (08/06 0447) SpO2:  [95 %-100 %] 99 % (08/06 0447) Weight:  [79.9 kg] 79.9 kg (08/05 1359) Last BM Date : 01/05/24  Intake/Output from previous day: 08/05 0701 - 08/06 0700 In: 2799.3 [I.V.:2618.2; IV Piggyback:181.1] Out: 700 [Urine:280; Emesis/NG output:150; Stool:250; Blood:20] Intake/Output this shift: No intake/output data recorded.  General appearance: alert, cooperative, and no distress GI: Abdomen soft, nondistended, no percussion tenderness, incisional tenderness to palpation; no rigidity, guarding, rebound tenderness; midline incision C/D/I with honeycomb dressing in place, ostomy in right side of abdomen is very dark/black with some sloughing, bowel sweat and minimal stool in bag  Lab Results:  Recent Labs    01/07/24 0502 01/08/24 0502  WBC 4.3 8.0  HGB 9.1* 9.4*  HCT 28.1* 29.4*  PLT 116* 121*   BMET Recent Labs    01/07/24 0502 01/08/24 0502  NA 141 142  K 3.4* 5.0  CL 105 108  CO2 24 22  GLUCOSE 111* 215*  BUN 13 11  CREATININE 0.73 0.74  CALCIUM  8.4* 8.3*   PT/INR No results for input(s): LABPROT, INR in the last 72 hours.  Studies/Results: DG Chest Port 1 View Result Date: 01/07/2024 CLINICAL DATA:  [id number] Encounter for nasogastric (NG) tube placement [id number] EXAM: PORTABLE CHEST 1 VIEW COMPARISON:  Chest x-ray 02/16/2022, CT chest  03/06/2022 FINDINGS: Enteric tube with tip overlying the gastric lumen and side port overlying the expected region of the distal esophageal lumen. Lower lung zones are visualized and grossly unremarkable. Upper lung zones are collimated off view. Nonobstructive bowel gas pattern. No acute osseous abnormality. Multiple calcified stone within the right upper quadrant consistent with cholelithiasis. IMPRESSION: 1. Enteric tube with tip overlying the gastric lumen and side port overlying the expected region of the distal esophageal lumen. Recommend advancing by 8 cm. 2. Cholelithiasis. Electronically Signed   By: Morgane  Naveau M.D.   On: 01/07/2024 19:03   CT ABDOMEN PELVIS W CONTRAST Result Date: 01/06/2024 CLINICAL DATA:  Abdominal pain with nausea and vomiting two days ago. History of ovarian cancer. EXAM: CT ABDOMEN AND PELVIS WITH CONTRAST TECHNIQUE: Multidetector CT imaging of the abdomen and pelvis was performed using the standard protocol following bolus administration of intravenous contrast. RADIATION DOSE REDUCTION: This exam was performed according to the departmental dose-optimization program which includes automated exposure control, adjustment of the mA and/or kV according to patient size and/or use of iterative reconstruction technique. CONTRAST:  OMNIPAQUE  IOHEXOL  300 MG/ML  SOLN COMPARISON:  11/04/2023 FINDINGS: Lower chest: Heart is normal size.  Lung bases are clear. Hepatobiliary: Liver and biliary tree are normal. There is moderate cholelithiasis. Pancreas: Normal. Spleen: Normal. Adrenals/Urinary Tract: Adrenal glands are normal. Kidneys are normal in size without hydronephrosis or nephrolithiasis. Stable left renal cyst. Ureters are normal. Mild hazy bladder wall thickening circumferentially unchanged. Stomach/Bowel: Stomach is normal. Evidence of multiple persistent fluid-filled dilated small  bowel loops measuring up to 4.8 cm in diameter with slight interval worsening. This appears to  be due to a distal obstruction with transition point likely over the midline pelvis involving an ileal loop. Air is present throughout the colon. Appendix not definitely visualized. Vascular/Lymphatic: Abdominal aorta is normal in caliber with minimal calcified plaque distally. Remaining vascular structures are unremarkable. No adenopathy. Reproductive: Small ill-defined uterus containing peripherally enhancing 2.1 cm mass likely uterine fibroid as these findings are unchanged. Adnexal regions unremarkable. Other: Worsening mild free fluid over the upper abdomen with minimal free fluid adjacent the bladder. Acute evidence of small anterior peritoneal implants without significant change several small masses within the subcutaneous fat of the anterior lower abdominal/pelvic wall which are new as cannot exclude metastatic disease. Small ventral hernia right of midline over the mid to upper abdomen unchanged and containing only peritoneal fat. Periumbilical hernia less prominent containing partial segment of small bowel. Musculoskeletal: No focal abnormality. IMPRESSION: 1. Evidence of multiple persistent fluid-filled dilated small bowel loops measuring up to 4.8 cm in diameter with slight interval worsening. This appears to be due to a distal obstruction with transition point likely over the midline pelvis involving an ileal loop likely due to adhesions. 2. Worsening mild free fluid over the upper abdomen with minimal stable free fluid adjacent the bladder. 3. Evidence of small anterior peritoneal implants without significant change. Several small masses within the subcutaneous fat of the anterior lower abdominal/pelvic wall which are new and may be additional sites of metastatic disease in this patient with known ovarian cancer. 4. Cholelithiasis. 5. Stable left renal cyst. 6. Small ventral hernia right of midline over the mid to upper abdomen unchanged and containing only peritoneal fat. Periumbilical hernia less  prominent containing partial segment of small bowel. 7. Aortic atherosclerosis. Aortic Atherosclerosis (ICD10-I70.0). Electronically Signed   By: Toribio Agreste M.D.   On: 01/06/2024 16:09    Anti-infectives: Anti-infectives (From admission, onward)    Start     Dose/Rate Route Frequency Ordered Stop   01/08/24 0600  ceFAZolin  (ANCEF ) IVPB 2g/100 mL premix        2 g 200 mL/hr over 30 Minutes Intravenous On call to O.R. 01/07/24 1323 01/07/24 1448   01/07/24 1323  ceFAZolin  (ANCEF ) 2-4 GM/100ML-% IVPB       Note to Pharmacy: Durenda Clotilda HERO: cabinet override      01/07/24 1323 01/07/24 1455       Assessment/Plan:  Patient is a 53 year old female who was admitted with concern for a malignant small bowel obstruction.  She is status post exploratory laparotomy, lysis of adhesions, small bowel resection, and creation of palliative and ileostomy on 8/5.  -Patient without leukocytosis today.  Hemoglobin stable.  Will continue to monitor with daily labs -NG tube to LIS, will maintain until increased ostomy output -Ostomy is very dark on evaluation, will see if the area improves over the next day or so.  Will plan to perform test tube test to evaluate for viability deeper if area fails to improve -Ostomy nurse recommendations appreciated -IV fluids per primary team -Patient with increasing urine output today.  Will plan to discontinue Foley catheter -Palliative care has been consulted.  Will reach out to oncology as well for evaluation of the patient and to discuss goals of care -Appreciate hospitalist recommendations   LOS: 1 day    Khadeeja Elden A Netanel Yannuzzi 01/08/2024  Note: Portions of this report may have been transcribed using voice recognition software. Every effort has been  made to ensure accuracy; however, inadvertent computerized transcription errors may still be present.

## 2024-01-08 NOTE — Consult Note (Signed)
 [facility] Consultation Oncology  Name: Nicole Santos      MRN: [id number]    Location: J674/J674-98  Date: 01/08/2024 Time:4:56 PM   REFERRING PHYSICIAN: Drs. 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 is a 53 year old pleasant female known to me from office visits.  She had a recent admission for partial small bowel obstruction and was discharged home on soft foods.  She went to her grandsons birthday party on Wednesday and ate a soup.  She started vomiting that evening.  She came to the ER and a CT AP showed multiple persistent fluid-filled dilated small bowel loops measuring up to 4.8 cm with slight interval worsening since previous imaging.  She underwent exploratory laparotomy, lysis of adhesions, small bowel resection and creation of palliative end ileostomy on 01/07/2024.  Intraoperative findings showed peritoneal carcinomatosis with studding across all intra-abdominal surfaces.  PAST MEDICAL HISTORY:   Past Medical History:  Diagnosis Date   Acute pulmonary embolism (HCC) 03/06/2022   Acute respiratory disease due to COVID-19 virus 02/02/2019   Anxiety    Cancer (HCC)    Depression    Dyspnea    Dysrhythmia    Family history of bladder cancer    Family history of breast cancer    History of diabetes mellitus    Resolved in Sept 2023 with weight loss.   Hypertension    Ovarian cancer (HCC) 11/14/2021   Peritoneal carcinomatosis (HCC)    Pneumonia    Sleep apnea    doesn't use CPAP   Tachycardia     ALLERGIES: Allergies  Allergen Reactions   Augmentin  [Amoxicillin -Pot Clavulanate] Diarrhea and Nausea And Vomiting   Doxycycline Other (See Comments)    Abdominal cramps.   Topamax  [Topiramate ] Nausea And Vomiting      MEDICATIONS: I have reviewed the patient's current medications.     PAST SURGICAL HISTORY Past  Surgical History:  Procedure Laterality Date   BOWEL RESECTION  01/07/2024   Procedure: SMALL BOWEL RESECTION;  Surgeon: Evonnie Dorothyann LABOR, DO;  Location: [facility];  Service: General;;   ILEOSTOMY  01/07/2024   Procedure: CREATION, PALLIATIVE ILEOSTOMY;  Surgeon: Evonnie Dorothyann LABOR, DO;  Location: [facility];  Service: General;;   KNEE SURGERY Right    Arthroscopic   LAPAROSCOPIC ABDOMINAL EXPLORATION N/A 11/13/2021   Procedure: Exploratory laparoscopy;  Surgeon: Mavis Anes, MD;  Location: [facility];  Service: General;  Laterality: N/A;   LAPAROTOMY N/A 01/07/2024   Procedure: LAPAROTOMY, EXPLORATORY, LYSIS OF ADHESIONS;  Surgeon: Evonnie Dorothyann LABOR, DO;  Location: [facility];  Service: General;  Laterality: N/A;   PORTACATH PLACEMENT Left 11/15/2021   Procedure: INSERTION PORT-A-CATH;  Surgeon: Mavis Anes, MD;  Location: [facility];  Service: General;  Laterality: Left;   TONSILLECTOMY AND ADENOIDECTOMY      FAMILY HISTORY: Family History  Problem Relation Age of Onset   Hypertension Mother    Heart attack Paternal Uncle    Bladder Cancer Maternal Grandmother 75       non-smoker   Dementia Maternal Grandfather    Breast cancer Maternal Great-grandmother        MGF's mother   Colon cancer Neg Hx    Ovarian cancer Neg Hx    Endometrial cancer Neg Hx    Pancreatic cancer Neg Hx    Prostate cancer Neg Hx     SOCIAL HISTORY:  reports that she has never  smoked. She has never used smokeless tobacco. She reports that she does not drink alcohol and does not use drugs.  PERFORMANCE STATUS: The patient's performance status is 2 - Symptomatic, <50% confined to bed  PHYSICAL EXAM: Most Recent Vital Signs: Blood pressure 120/73, pulse (!) 107, temperature 98.2 F (36.8 C), temperature source Oral, resp. rate 17, height 5' 2 (1.575 m), weight 176 lb 2.4 oz (79.9 kg), SpO2 96%. BP 120/73 (BP Location: Right Wrist)   Pulse (!) 107   Temp 98.2 F (36.8 C) (Oral)   Resp 17   Ht 5' 2  (1.575 m)   Wt 176 lb 2.4 oz (79.9 kg)   SpO2 96%   BMI 32.22 kg/m  General appearance: alert and cooperative  LABORATORY DATA:  Results for orders placed or performed during the hospital encounter of 01/06/24 (from the past 48 hours)  Basic metabolic panel     Status: Abnormal   Collection Time: 01/07/24  5:02 AM  Result Value Ref Range   Sodium 141 135 - 145 mmol/L   Potassium 3.4 (L) 3.5 - 5.1 mmol/L   Chloride 105 98 - 111 mmol/L   CO2 24 22 - 32 mmol/L   Glucose, Bld 111 (H) 70 - 99 mg/dL    Comment: Glucose reference range applies only to samples taken after fasting for at least 8 hours.   BUN 13 6 - 20 mg/dL   Creatinine, Ser 9.26 0.44 - 1.00 mg/dL   Calcium  8.4 (L) 8.9 - 10.3 mg/dL   GFR, Estimated >39 >39 mL/min    Comment: (NOTE) Calculated using the CKD-EPI Creatinine Equation (2021)    Anion gap 12 5 - 15    Comment: Performed at [facility], [address], [place], [name] [address]  CBC     Status: Abnormal   Collection Time: 01/07/24  5:02 AM  Result Value Ref Range   WBC 4.3 4.0 - 10.5 K/uL   RBC 2.48 (L) 3.87 - 5.11 MIL/uL   Hemoglobin 9.1 (L) 12.0 - 15.0 g/dL   HCT 71.8 (L) 63.9 - 53.9 %   MCV 113.3 (H) 80.0 - 100.0 fL   MCH 36.7 (H) 26.0 - 34.0 pg   MCHC 32.4 30.0 - 36.0 g/dL   RDW 75.7 (H) 88.4 - 84.4 %   Platelets 116 (L) 150 - 400 K/uL   nRBC 1.4 (H) 0.0 - 0.2 %    Comment: Performed at [facility], [address] [place], [name] [address]  Type and screen [facility]     Status: None   Collection Time: 01/07/24  2:15 PM  Result Value Ref Range   ABO/RH(D) B NEG    Antibody Screen NEG    Sample Expiration      01/10/2024,2359 Performed at [facility], [address] [place], [name] [address]   Basic metabolic panel with GFR     Status: Abnormal   Collection Time: 01/08/24  5:02 AM  Result Value Ref Range   Sodium 142 135 - 145 mmol/L   Potassium 5.0 3.5 - 5.1 mmol/L   Chloride 108 98 - 111 mmol/L   CO2 22 22 - 32 mmol/L    Glucose, Bld 215 (H) 70 - 99 mg/dL    Comment: Glucose reference range applies only to samples taken after fasting for at least 8 hours.   BUN 11 6 - 20 mg/dL   Creatinine, Ser 9.25 0.44 - 1.00 mg/dL   Calcium  8.3 (L) 8.9 - 10.3 mg/dL  GFR, Estimated >60 >60 mL/min    Comment: (NOTE) Calculated using the CKD-EPI Creatinine Equation (2021)    Anion gap 12 5 - 15    Comment: Performed at [facility], [address], [place], [name] [address]  Magnesium      Status: None   Collection Time: 01/08/24  5:02 AM  Result Value Ref Range   Magnesium  1.7 1.7 - 2.4 mg/dL    Comment: Performed at [facility], [address], [place], [name] [address]  CBC     Status: Abnormal   Collection Time: 01/08/24  5:02 AM  Result Value Ref Range   WBC 8.0 4.0 - 10.5 K/uL   RBC 2.54 (L) 3.87 - 5.11 MIL/uL   Hemoglobin 9.4 (L) 12.0 - 15.0 g/dL   HCT 70.5 (L) 63.9 - 53.9 %   MCV 115.7 (H) 80.0 - 100.0 fL   MCH 37.0 (H) 26.0 - 34.0 pg   MCHC 32.0 30.0 - 36.0 g/dL   RDW 75.2 (H) 88.4 - 84.4 %   Platelets 121 (L) 150 - 400 K/uL   nRBC 0.9 (H) 0.0 - 0.2 %    Comment: Performed at [facility], [address] [place], [name] [address]      RADIOGRAPHY: DG Chest Port 1 View Result Date: 01/07/2024 CLINICAL DATA:  [id number] Encounter for nasogastric (NG) tube placement [id number] EXAM: PORTABLE CHEST 1 VIEW COMPARISON:  Chest x-ray 02/16/2022, CT chest 03/06/2022 FINDINGS: Enteric tube with tip overlying the gastric lumen and side port overlying the expected region of the distal esophageal lumen. Lower lung zones are visualized and grossly unremarkable. Upper lung zones are collimated off view. Nonobstructive bowel gas pattern. No acute osseous abnormality. Multiple calcified stone within the right upper quadrant consistent with cholelithiasis. IMPRESSION: 1. Enteric tube with tip overlying the gastric lumen and side port overlying the expected region of the distal esophageal lumen. Recommend advancing by 8 cm. 2.  Cholelithiasis. Electronically Signed   By: Morgane  Naveau M.D.   On: 01/07/2024 19:03        ASSESSMENT and PLAN:  1.  High-grade serous carcinoma with peritoneal carcinomatosis: - I have reviewed her records and operative report. - I have discussed with Dr. Viktoria (GYN oncology).  We both feel that if her bowel function improves, she can be a candidate for palliative chemotherapy to improve quality of life and prolong survival.  However it may be challenging with increased output from ileostomy.  Options include single agent Doxil. - I would recommend that she follow-up with Dr. Viktoria after discharge. - Discussed with patient and her husband.   All questions were answered. The patient knows to call the clinic with any problems, questions or concerns. We can certainly see the patient much sooner if necessary.    Cece Milhouse

## 2024-01-08 NOTE — Progress Notes (Signed)
 Progress Note   Patient: Nicole Santos FMW:[id number] DOB: Sep 20, 1970 DOA: 01/06/2024     1 DOS: the patient was seen and examined on 01/08/2024   Brief hospital admission narrative course: As per H&P written by Dr. Pearlean on 01/06/2024 Nicole Santos is a 53 y.o. female with medical history significant for ovarian cancer, peritoneal carcinomatosis, pulmonary embolism, hypertension. Patient presented to the ED with complaints of worsening of her chronic abdominal pain that started 3 days ago, with vomiting.  She has been compliant with soft diet as recommended by her providers, but she reports persistent pain with oral intake.   Last hospitalization 11/04/2023 through 11/07/2023 with partial small bowel obstruction-managed conservatively   ED Course: Initial tachycardia heart rate up to 127 now improved. CT AP with contrast-suggest small bowel obstruction. EDP talked to general surgeon Dr. Evonnie, will see in consult, can hold NG tube as patient has not had any vomiting in 2 days.  01/07/2024 status post exploratory laparotomy, lysis of adhesion, small bowel resection,  ileostomy creation     Assessment and plan  Small bowel obstruction Postop day #1 01/07/2024 status post exploratory laparotomy, lysis of adhesion, small bowel resection,  ileostomy creation -Tolerated procedure well, hemodynamically stable - No gas or bowel movement yet, serosanguineous fluid in ileostomy bag, diffuse tender abdomen -NG tube in place  -  Patient with underlying history of ovarian cancer with peritoneal carcinomatosis, multiple hospitalization for that reason, SBO - Transition point appreciated on CT scan  -Continue IVF, n.p.o., IV Protonix  --Surgery following closely -Will encourage ambulation, pending bowel function return   Essential hypertension - N.p.o. for now, monitoring BP closely, as needed hydralazine  Hyperlipidemia - Planning to resume statin when able to tolerate by mouth.  Pulmonary  embolism (HCC) -Continue holding Eliquis   - Continue treatment with Lovenox   Primary peritoneal carcinomatosis (HCC) -Continue outpatient follow-[facility] Dr[facility]ragadda[facility]    Chronic thrombocytopenia - Appears to be stable - No prior bleeding appreciated - Continue to follow trend.  Class I obesity -Body mass index is 32.22 kg/m.  -Low-calorie diet, portion control discussed with patient.   Physical Exam: Blood pressure (!) 140/67, pulse (!) 110, temperature 97.7 F (36.5 C), temperature source Oral, resp. rate 17, height 5' 2 (1.575 m), weight 79.9 kg, SpO2 99%.  General:  AAO x 3,  cooperative, no distress;   HEENT:  Normocephalic, PERRL, otherwise with in Normal limits   Neuro:  CNII-XII intact. , normal motor and sensation, reflexes intact   Lungs:   Clear to auscultation BL, Respirations unlabored,  No wheezes / crackles  Cardio:    S1/S2, RRR, No murmure, No Rubs or Gallops   Abdomen:  Soft, new ileostomy bag in place, serosanguineous fluid noted, Mild inflammation around the ostomy crater -Diffuse abdominal tenderness negative for any distention  hypoactive bowel sounds no guarding or peritoneal signs.  Muscular  skeletal:  Limited exam -global generalized weaknesses - in bed, able to move all 4 extremities,   2+ pulses,  symmetric, No pitting edema  Skin:  Dry, warm to touch, negative for any Rashes,  Wounds: Please see nursing documentation          Data Reviewed: Basic metabolic panel: Sodium 141, potassium 3.4, chloride 105, bicarb 24, BUN 13, creatinine 0.73 and GFR >60 CBC: WBCs 4.3, hemoglobin 9.1 and platelet counts 116K  Family Communication: No family at bedside.  Disposition: Status is: Observation The patient will require care spanning > 2  midnights and should be moved to inpatient because: Continue IV therapy, electrolyte repletion and anticipate longer hospitalization while pursuing surgical intervention for  SBO.  Anticipating discharge back home once medically stable.  Time spent: 50 minutes  Author: [name], MD 01/08/2024 10:21 AM

## 2024-01-08 NOTE — Consult Note (Cosign Needed Addendum)
 Consultation Note Date: 01/08/2024   Patient Name: Nicole Santos  DOB: 1970-10-29  MRN: [id number]  Age / Sex: 53 y.o., female  PCP: Jolinda Norene HERO, DO Referring Physician: Willette Adriana LABOR, MD  Reason for Consultation: Establishing goals of care  HPI/Patient Profile: 53 y.o. female  with past medical history of ovarian cancer (11/14/2021), peritoneal carcinomatosis, history of cervical cancer (2000), sleep apnea (not on CPAP), hypertension, depression, anxiety, history of PE, history of diabetes that resolved with weight loss admitted on 01/06/2024 with intractable nausea and vomiting.  Symptoms improved with bowel rest.  She was discharged home.  She was followed by her GYN oncologist and medical oncologist in the interim.  Unfortunately, she had a worsening in her abdominal pain as well as some associated nausea and vomiting and was readmitted on 01/06/2024 she underwent exploratory laparotomy on 01/07/2024.  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  Surgical team unable to release any of her small bowel that was adherent to her pelvis and as such an end ileostomy was placed.  Noted to have numerous nodules throughout her abdomen and significant peritoneal carcinomatosis.  Admitted to the floor for further management and pain control.  Surgery recommends palliative referral for goals of care. Labs and imaging reviewed.  Renal function appears stable.  White count normal.  She is anemic but hemoglobin is stable since admission (01/07/2024: Hemoglobin 9.1, recheck 01/08/2024 hemoglobin 9.4 g/dL).  CT abdomen and pelvis independently reviewed I see the 2 masses localized to the lower abdomen.  Also see dilated small bowel loops and some free fluid.  She is on IV Dilaudid  for pain management.  Currently receiving 0.5 mg every 4 hours as needed.  She has had 3 doses in the past 24 hours.     Patient was diagnosed with ovarian cancer in 2023.  She has  been followed by outpatient oncology and has undergone treatment with chemotherapy (appears to have been on maintenance therapy) and surgical interventions.  Unfortunately, in June 2025 she developed a small bowel obstruction in the setting of adhesions with peritoneal carcinomatosis this appears to have been managed conservatively.  Today, patient is awake and alert and laying in bed.  She is reporting some pain to her colostomy site and rates it at about a 6 out of 10.  She states that the pain medication that she is given in her IV is helpful but just does not last the whole 4 hours.  She states that she tolerates the pain medication well and does not have any excessive sedation.  She also reports some back pain and discomfort which is a chronic issue for her and exacerbated by her inability to sleep on her stomach.  She is also reporting some significant acid reflux.  She denies any recent nausea or vomiting.  Does have some output from her ileostomy.  She is currently n.p.o. but hopeful for ice chips or sips of liquid as her mouth feels dry.  Has a decreased appetite but this has been ongoing for some time now.  PMT has been consulted to assist with goals of care conversation.  Clinical Assessment and Goals of Care:  I have reviewed medical records including EPIC notes, labs and imaging (independently reviewed), prior hospital encounters, outpatient specialty notes, medication administration record, vital signs, assessed the patient and then met with patient and her adult son/daughter to discuss diagnosis prognosis, GOC, EOL wishes, disposition and options.  Also collaborated with bedside nursing staff, admitting  team, and transition of care team.  I introduced Palliative Medicine as specialized medical care for people living with serious illness. It focuses on providing relief from the symptoms and stress of a serious illness. The goal is to improve quality of life for both the patient and the  family.  We discussed a brief life review of the patient and then focused on their current illness.   I attempted to elicit values and goals of care important to the patient.    Medical History Review and Family/Patient Understanding:   Patient understands the findings of the surgery and concerns for likely cancer progression.  We discussed that while we are hopeful she will continue to improve enough to meet her goal of returning home, we are worried that her health will worsen and could worsen quickly.  Social History:  Patient lives at home with her husband.  She has an adult daughter and son as well as 4 grandchildren.  Her family is very involved in her care and very supportive.  Functional and Nutritional State:   Prior to this hospitalization, patient was living at home and completing all of her ADLs.  She did note some generalized weakness and fatigue but was still able to care for herself.  She did report that she had been trying to follow a soft diet due to her abdominal concerns and has had poor appetite and significant weight loss over the past few months.  Palliative Symptoms:  Pain  Advance Directives:  A detailed discussion regarding advanced directives was had.  We discussed the importance of anticipatory care planning given recent findings.  I encouraged her to consider who would be her health surrogate if she was unable to speak for herself, what having a good quality of life looks like for her, and what trade-offs she is willing to go through for the possibility of more time and the things that she would not be willing to go through for the possibility of more time.  She states that right now her goal is to live as long as possible to be there for her children and grandchildren.  She states that she would like to hear from oncology to hear more about her treatment options and what care would look like going forward before making any significant changes to her plan of care.   She has never completed a living will or advanced directive in the past.  She states that she will not be pushed to make a decision 1 way or another regarding her care and I assured her that we are all here to support her but just want to make sure her wishes and goals are honored.  She states that she understands this and will continue to consider her goals and wishes as she learns more about her options.  She also states that she would not make any large health decision without her entire family (husband, adult children) present.  I offered to assist with a family meeting later this week to clarify goals of care and anticipatory care planning.  She will consider this.  For now she wishes to continue to pursue aggressive medical therapies and maintains full code/full scope status.  She is agreeable to ongoing inpatient palliative monitoring and outpatient palliative follow-up.  Code Status:  Full code  Discussion:  Had discussion regarding anticipatory care planning and goals of care.  Patient states her goal is to return home with her family.  She wishes to continue current medical management and  time for outcomes/further workup/oncology recs/further family discussion before making any significant decisions. Palliative Care services outpatient were explained and offered.  She is agreeable for outpatient palliative follow-up upon discharge.  Discussed the importance of continued conversation with family and the medical providers regarding overall plan of care and treatment options, ensuring decisions are within the context of the patient's values and GOCs.   Questions and concerns were addressed. The family was encouraged to call with questions or concerns.  PMT will continue to support holistically.   PATIENT    SUMMARY OF RECOMMENDATIONS    Maintain full code/full scope Adjust pain medication frequency to better control postsurgical pain Allow time for outcomes/further discussion with  family and oncology team Outpatient palliative referral at discharge PMT will to continue to follow for symptom management and ongoing goals of care discussion  Code Status/Advance Care Planning: Full code   Symptom Management:  Will increase hydromorphone  from 0.5 mg every 4 hours as needed to 0.5 mg every 3 hours as needed Continue Robaxin  500 mg IV every 8 hours for back pain Continue Zofran  as needed Pantoprazole  40 mg IV every 12 hours for dyspepsia Currently oral meds are on hold due to n.p.o. status.  Once n.p.o. status no longer effective would recommend continuing Tylenol  and oxycodone  as needed   Additional Recommendations (Limitations, Scope, Preferences): Full Scope Treatment   Prognosis:  Guarded  Discharge Planning: To Be Determined      Primary Diagnoses: Present on Admission:  Essential hypertension  Primary peritoneal carcinomatosis (HCC)  Pulmonary embolism (HCC)  Small bowel obstruction (HCC)  SBO (small bowel obstruction) (HCC)    Physical Exam Constitutional:      General: She is not in acute distress.    Appearance: She is not toxic-appearing.  Pulmonary:     Effort: Pulmonary effort is normal. No respiratory distress.  Abdominal:     Comments: Ileostomy present to right abdomen.  Moderate amount of clear reddish/brown drainage present in bag. Stoma site and peristomal area appears very dark in color.   Skin:    General: Skin is warm and dry.  Neurological:     Mental Status: She is alert.     Vital Signs: BP (!) 140/67 (BP Location: Right Arm)   Pulse (!) 110   Temp 97.7 F (36.5 C) (Oral)   Resp 17   Ht 5' 2 (1.575 m)   Wt 79.9 kg   SpO2 99%   BMI 32.22 kg/m  Pain Scale: 0-10   Pain Score: Asleep   SpO2: SpO2: 99 % O2 Device:SpO2: 99 % O2 Flow Rate: .    Palliative Assessment/Data: 50-60%    Billing based on MDM: High  Problems Addressed: One acute or chronic illness or injury that poses a threat to life or bodily  function  Amount and/or Complexity of Data: Category 1:Review of prior external note(s) from each unique source and Category 2:Independent interpretation of a test performed by another physician/other qualified health care professional (not separately reported)  Risks: Parenteral controlled substances   Laymon CHRISTELLA Pinal, NP  Palliative Medicine Team Team phone # [phone]  Thank you for allowing the Palliative Medicine Team to assist in the care of this patient. Please utilize secure chat with additional questions, if there is no response within 30 minutes please call the above phone number.  Palliative Medicine Team providers are available by phone from 7am to 7pm daily and can be reached through the team cell phone.  Should this patient require assistance  outside of these hours, please call the patient's attending physician.

## 2024-01-09 DIAGNOSIS — Z515 Encounter for palliative care: Secondary | ICD-10-CM | POA: Diagnosis not present

## 2024-01-09 DIAGNOSIS — K56609 Unspecified intestinal obstruction, unspecified as to partial versus complete obstruction: Secondary | ICD-10-CM | POA: Diagnosis not present

## 2024-01-09 DIAGNOSIS — R110 Nausea: Secondary | ICD-10-CM

## 2024-01-09 LAB — BASIC METABOLIC PANEL WITH GFR
Anion gap: 10 (ref 5–15)
BUN: 11 mg/dL (ref 6–20)
CO2: 23 mmol/L (ref 22–32)
Calcium: 8.2 mg/dL — ABNORMAL LOW (ref 8.9–10.3)
Chloride: 109 mmol/L (ref 98–111)
Creatinine, Ser: 0.7 mg/dL (ref 0.44–1.00)
GFR, Estimated: >60 mL/min (ref >60)
Glucose, Bld: 119 mg/dL — ABNORMAL HIGH (ref 70–99)
Potassium: 4 mmol/L (ref 3.5–5.1)
Sodium: 142 mmol/L (ref 135–145)

## 2024-01-09 LAB — CBC
HCT: 25.2 % — ABNORMAL LOW (ref 36.0–46.0)
Hemoglobin: 8 g/dL — ABNORMAL LOW (ref 12.0–15.0)
MCH: 37.2 pg — ABNORMAL HIGH (ref 26.0–34.0)
MCHC: 31.7 g/dL (ref 30.0–36.0)
MCV: 117.2 fL — ABNORMAL HIGH (ref 80.0–100.0)
Platelets: 94 K/uL — ABNORMAL LOW (ref 150–400)
RBC: 2.15 MIL/uL — ABNORMAL LOW (ref 3.87–5.11)
RDW: 24.8 % — ABNORMAL HIGH (ref 11.5–15.5)
WBC: 8.7 K/uL (ref 4.0–10.5)
nRBC: 0.7 % — ABNORMAL HIGH (ref 0.0–0.2)

## 2024-01-09 MED ORDER — SODIUM CHLORIDE 0.9 % IV SOLN
INTRAVENOUS | Status: DC
  Filled 2024-01-09 (×3): qty 500

## 2024-01-09 NOTE — Plan of Care (Signed)
   Problem: Coping: Goal: Level of anxiety will decrease Outcome: Progressing

## 2024-01-09 NOTE — Progress Notes (Signed)
 Daily Progress Note   Patient Name: Nicole Santos       Date: 01/09/2024 DOB: 08/10/1970  Age: 53 y.o. MRN#: [id number] Attending Physician: Willette Adriana LABOR, MD Primary Care Physician: Jolinda Norene HERO, DO Admit Date: 01/06/2024  Reason for Consultation/Follow-up: Establishing goals of care/symptom management  Subjective:  53 y.o. female  with past medical history of ovarian cancer (11/14/2021), peritoneal carcinomatosis, history of cervical cancer (2000), sleep apnea (not on CPAP), hypertension, depression, anxiety, history of PE, history of diabetes that resolved with weight loss admitted on 01/06/2024 with intractable nausea and vomiting.   Recurrent hospitalizations for SBO (most recent 11/2023). Managed conservatively. Symptoms improved with bowel rest.  She was discharged home.  She was followed by her GYN oncologist and medical oncologist in the interim.  Unfortunately, she had a worsening in her abdominal pain as well as some associated nausea and vomiting and was readmitted on 01/06/2024 she underwent exploratory laparotomy on 01/07/2024.  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  Surgical team unable to release any of her small bowel that was adherent to her pelvis and as such an end ileostomy was placed.  Noted to have numerous nodules throughout her abdomen and significant peritoneal carcinomatosis.  Admitted to the floor for further management and pain control.  Surgery recommends palliative referral for goals of care.  01/08/2024: Goals of care discussion completed.  Patient declines to make any changes to her advance directives at this time.  She is hopeful to hear from her oncology team about treatment options.  She also does not wish to make any changes to her advance directives without first discussing with her  entire family.  She will let us  know if she desires family meeting but declined need for 1 at this time.  We adjusted her pain regimen due to end of dose breakthrough pain.  Today, reviewed diagnostics.  White blood cell count normal.  Hemoglobin trending down with hemoglobin of 8 g/dL today compared to 9.4 g/dL yesterday.  Recent surgery.  Trend.  Renal function is stable.  Electrolytes are normal.  Total opioid given for the past 24 hours: 2.5 mg of Dilaudid .  Vital signs appear stable with some intermittent tachycardia which patient reports is baseline for her.  Patient is seen in her room.  No family currently at bedside.  She states she is doing better.  She feels her pain is better controlled and states that she has noticed she does not need as much pain medicine today as she did yesterday. She denies any nausea and vomiting. She states that her acid  reflux has improved since NG tube was discontinued. She is waiting to hear from her oncology team regarding next steps/treatment options. All questions answered.   Chart review/care coordination:  Completed extensive chart review including EPIC notes, labs (independently reviewed), vital signs, medication administration record. Coordinated care with patient and bedside nursing.    Length of Stay: 2   Physical Exam Constitutional:      General: She is not in acute distress.    Appearance: She is not toxic-appearing.     Comments: Awake, alert, appears comfortable. NG tube removed. Eating an [place] ice.   Pulmonary:     Effort: Pulmonary effort is normal. No respiratory distress.  Skin:    General: Skin is warm and dry.  Neurological:     Mental Status: She is alert.             Vital Signs: BP 131/73 (BP Location: Right Wrist)   Pulse 95   Temp 98.2 F (36.8 C) (Oral)   Resp 18   Ht 5' 2 (1.575 m)   Wt 79.9 kg   SpO2 98%   BMI 32.22 kg/m  SpO2: SpO2: 98 % O2 Device: O2 Device: Room Air O2 Flow Rate:        Palliative  Assessment/Data: 60-70%   Palliative Care Assessment & Plan   Patient Profile/Assessment:  53 y.o. female  with past medical history of ovarian cancer (11/14/2021), peritoneal carcinomatosis, history of cervical cancer (2000), sleep apnea (not on CPAP), hypertension, depression, anxiety, history of PE, history of diabetes that resolved with weight loss admitted on 01/06/2024 with intractable nausea and vomiting.  Diagnosed with recurrent small bowel obstruction (most recently hospitalized 6/25 with the same, managed conservatively).  Underwent exploratory laparotomy on 01/07/2024.  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  As such, a palliative end ileostomy was placed.  Goals of care discussion completed on 01/08/2024.  Patient states that she wants to hear from oncology regarding her treatment options and discuss with her entire family before making any changes to her advance directives.  She was having some end of dose failure on 01/08/2024.  Therefore, frequency of her IV pain medication was adjusted.  Fortunately, pain appears to have improved.  We discussed the need to eventually transition to oral pain regimen only in anticipation of discharge.  She is agreeable and will attempt to make adjustments once we determine her sustained ability to tolerate p.o. intake by mouth.  She also reports an improvement in her GERD symptoms.  NG tube discontinued and she is tolerating clear liquid diet.  She reports she plans to meet with oncology today and discuss treatment options.   Recommendations/Plan:  Full code/full scope Continue goals of care discussion as she hears more about her treatment options and as she is able to speak with family Continue to aggressively manage symptoms including pain and nausea, current pain/nausea regimen appears effective, pain improving, denies nausea, will work to transition to oral symptom regimens as able based off of her sustained  ability to tolerate p.o. intake and reported pain levels Palliative medicine team to continue to follow for ongoing goals of care and symptom management   Symptom management:  Continue Tylenol  1000 mg p.o. every 6 hours Continue hydromorphone  0.5 mg IV every 3 hours as needed Continue Robaxin  500 mg IV every 8 hours Continue Reglan  10 mg every 8 hours Continue Zofran  4 mg every 8 hours as needed Continue oxycodone  5 mg  every 4 hours as needed  Prognosis:  Unable to determine    Discharge Planning: To Be Determined    Detailed review of medical records (labs, imaging, vital signs), medically appropriate exam, discussed with treatment team, counseling and education to patient, family, & staff, documenting clinical information, medication management, coordination of care   Billing based on MDM: High  Problems Addressed: One acute or chronic illness or injury that poses a threat to life or bodily function  Amount and/or Complexity of Data: Category 2:Independent interpretation of a test performed by another physician/other qualified health care professional (not separately reported)  Risks: Parenteral controlled substances         Laymon CHRISTELLA Pinal, NP  Palliative Medicine Team Team phone # [phone]  Thank you for allowing the Palliative Medicine Team to assist in the care of this patient. Please utilize secure chat with additional questions, if there is no response within 30 minutes please call the above phone number.  Palliative Medicine Team providers are available by phone from 7am to 7pm daily and can be reached through the team cell phone.  Should this patient require assistance outside of these hours, please call the patient's attending physician.

## 2024-01-09 NOTE — Progress Notes (Signed)
 Progress Note   Patient: Nicole Santos FMW:[id number] DOB: [birth year] DOA: 01/06/2024     2 DOS: the patient was seen and examined on 01/09/2024   Brief hospital admission narrative course: As per H&P written by Dr. Pearlean on 01/06/2024 Junior LAMESHIA HYPOLITE is a 53 y.o. female with medical history significant for ovarian cancer, peritoneal carcinomatosis, pulmonary embolism, hypertension. Patient presented to the ED with complaints of worsening of her chronic abdominal pain that started 3 days ago, with vomiting.  She has been compliant with soft diet as recommended by her providers, but she reports persistent pain with oral intake.   Last hospitalization 11/04/2023 through 11/07/2023 with partial small bowel obstruction-managed conservatively   ED Course: Initial tachycardia heart rate up to 127 now improved. CT AP with contrast-suggest small bowel obstruction. EDP talked to general surgeon Dr. Evonnie, will see in consult, can hold NG tube as patient has not had any vomiting in 2 days.  01/07/2024 status post exploratory laparotomy, lysis of adhesion, small bowel resection,  ileostomy creation the    Subjective : patient was seen and examined this morning, stable no acute distress complaining of the NG tube, nausea and reflux - Hemodynamically stable Noted for residual in her ostomy bag, NG tube still in place, bilious output noted     Assessment and plan  Small bowel obstruction Postop day #2 01/07/2024 status post exploratory laparotomy, lysis of adhesion, small bowel resection,  ileostomy creation -Hemodynamically stable -Positive residual in ostomy bag -NG tube in place, with suction to the wall, positive for bilious output -Still complaining of nausea, reflux -On IV Reglan    -  Patient with underlying history of ovarian cancer with peritoneal carcinomatosis, multiple hospitalization for that reason, SBO - Transition point appreciated on CT scan  -Continue IVF, n.p.o., IV  Protonix  --Surgery following closely -Will encourage ambulation, pending bowel function return   Essential hypertension - N.p.o. for now, monitoring BP closely, as needed hydralazine - BP remains stable  Hyperlipidemia - Planning to resume statin when able to tolerate by mouth.  Pulmonary embolism (HCC) -Continue holding Eliquis   - SCDs  Primary peritoneal carcinomatosis (HCC) -Continue [facility] Dr[facility] Rogers[facility]    Chronic thrombocytopenia - Appears to be stable - No prior bleeding appreciated - Continue to follow trend.  Class I obesity -Body mass index is 32.22 kg/m.  -Low-calorie diet, portion control discussed with patient.   Physical Exam: Blood pressure 113/68, pulse (!) 110, temperature (!) 97.5 F (36.4 C), temperature source Axillary, resp. rate 17, height 5' 2 (1.575 m), weight 79.9 kg, SpO2 98%.   General:  AAO x 3,  cooperative, no distress;   HEENT:  NG tube in place-suction to the wall with greenish residual Normocephalic, PERRL, otherwise with in Normal limits   Neuro:  CNII-XII intact. , normal motor and sensation, reflexes intact   Lungs:   Clear to auscultation BL, Respirations unlabored,  No wheezes / crackles  Cardio:    S1/S2, RRR, No murmure, No Rubs or Gallops   Abdomen:  Colostomy in place, with positive residual Soft, non-tender, bowel sounds active all four quadrants, no guarding or peritoneal signs.  Muscular  skeletal:  Limited exam -global generalized weaknesses - in bed, able to move all 4 extremities,   2+ pulses,  symmetric, No pitting edema  Skin:  Dry, warm to touch, negative for any Rashes,  Wounds: Please see nursing documentation         Data Reviewed:  Latest Ref Rng & Units 01/09/2024    7:35 AM 01/08/2024    5:02 AM 01/07/2024    5:02 AM  CBC  WBC 4.0 - 10.5 K/uL 8.7  8.0  4.3   Hemoglobin 12.0 - 15.0 g/dL 8.0  9.4  9.1   Hematocrit 36.0 - 46.0 % 25.2  29.4  28.1    Platelets 150 - 400 K/uL 94  121  116       Latest Ref Rng & Units 01/09/2024    7:35 AM 01/08/2024    5:02 AM 01/07/2024    5:02 AM  CMP  Glucose 70 - 99 mg/dL 880  784  888   BUN 6 - 20 mg/dL 11  11  13    Creatinine 0.44 - 1.00 mg/dL 9.29  9.25  9.26   Sodium 135 - 145 mmol/L 142  142  141   Potassium 3.5 - 5.1 mmol/L 4.0  5.0  3.4   Chloride 98 - 111 mmol/L 109  108  105   CO2 22 - 32 mmol/L 23  22  24    Calcium  8.9 - 10.3 mg/dL 8.2  8.3  8.4      Family Communication: No family at bedside.  Disposition: Status is: Observation The patient will require care spanning > 2 midnights and should be moved to inpatient because: Continue IV therapy, electrolyte repletion and anticipate longer hospitalization while pursuing surgical intervention for SBO.  Anticipating discharge back home once medically stable.  Time spent: 50 minutes  Author: [name], MD 01/09/2024 1:33 PM

## 2024-01-09 NOTE — Consult Note (Signed)
 WOC Nurse ostomy consult note Stoma type/location: RUQ end ileostomy, dusky stoma with viable mucosa.  Patient spouse is present, but steps outside during pouch change.  Patient states her daughter is a Agricultural engineer and will help with this but is at work today.   Stomal assessment/size: 1 3/4 slightly budded but in deep abdominal creasing.  Photo in chart. Stoma is dark, sloughing.  Test tube in os reveals pink moist mucosa beneath surface sloughing.  Stoma is productive of liquid brown stool  Peristomal assessment: deep creasing at 3 and 9 o'clock.   Midline staple line. Honeycomb dressing removed,  Staples intact Treatment options for stomal/peristomal skin: barrier ring and 1 piece flexible convex pouch.  Is too tender for belt at this time, but will order one .  Output liquid brown stool  Ostomy pouching: 1pc.convex with barrier ring to promote seal Education provided: Pouch change performed. Explained twice weekly pouch changes, emptying when 1/3 full.  Showering with or without pouch once cleared by surgery.  Rationale for convex pouch and barrier ring due to creasing explained.  Dr. Evonnie is present and explains sloughing mucosa to patient.   Written folder provided and flyer for outpatient ostomy clinic.  WOC team will provide another pouch change Monday 01/13/24 if still inpatient.  Enrolled patient in [employer] DC program: Yes will today.   Will ask secretary to order supplies (for discharge and use as needed)  1 piece convex pouch:  LAWSON # [id number]- needs 5 Barrier ring :  LAWSON # [id number] needs 5 Ostomy belt LAWSON # [id number]  needs 1 Will follow. Darice Cooley MSN, RN, FNP-BC CWON Wound, Ostomy, Continence Nurse Outpatient [facility] [phone] Pager [phone]

## 2024-01-09 NOTE — Progress Notes (Signed)
   01/09/24 1347  Spiritual Encounters  Type of Visit Initial  Care provided to: Patient  Referral source Chaplain team  Reason for visit Routine spiritual support  OnCall Visit No  Interventions  Spiritual Care Interventions Made Established relationship of care and support;Compassionate presence;Narrative/life review;Explored values/beliefs/practices/strengths  Intervention Outcomes  Outcomes Awareness of support   Reason for Visit: Chaplain received referral from Chaplain teammate that one of our cancer center patients was inpatient and suggested a visit.   Description of Visit: Upon entering the room, Nicole Santos was awake and alert, and very pleasant in receiving me.  I explained my role in the cancer center and that I wanted to visit with her to explore what support I might be able to offer.  In seeking to develop rapport and explore emotional, relational, and spiritual needs I asked guided questions about Pt's diagnosis.  Pt presented a determined front stating, "I'm here, I'm not going anywhere, I am determined to stay here with my children and my grandchildren."  This chaplain understood through exploration of her statements that the surgeon has spoken with Pt and given a rather grim/poor prognosis which the Pt rejects, and is seeking another opinion on.  Pt also states that her family is not yet aware of what the surgeon has presented because Pt wants them all to be together and she will tell them.  Pt mentioned that Dr had suggested consults with hospice and "something else that started with P".  We discovered the P was palliative.  With the Pt's permission I took the opportunity to provide some education around what palliative care is and isn't- specifically that palliative care should not be seen as "giving up" as the Pt assumed.  I shared with her the benefits Palliative care could bring to her situation.  Pt appeared more open to palliative consult after our conversation.  Plan of  Care: I will continue to follow up with this Pt through her discharge and then any care from the Cancer Center.   Maude Roll, MDiv  Chaplain, [facility] [email or web address] [phone]

## 2024-01-09 NOTE — Progress Notes (Signed)
 [facility] Progress Note  2 Days Post-Op  Subjective: Patient seen and examined.  She is resting comfortably in bed.  She has abdominal pain, but this is controlled with the pain medications.  Her NG tube has had minimal bilious output, 150 cc documented in the last 24 hours.  There is 350 cc in the canister.  Patient has been consuming ice chips without nausea and vomiting.  She has liquid stool output from her right sided ileostomy.  She has had decreased urine output with 400 cc in the last.  Objective: Vital signs in last 24 hours: Temp:  [98.1 F (36.7 C)-98.2 F (36.8 C)] 98.2 F (36.8 C) (08/07 0500) Pulse Rate:  [95-107] 95 (08/07 0500) Resp:  [18] 18 (08/07 0500) BP: (110-131)/(60-73) 131/73 (08/07 0500) SpO2:  [96 %-98 %] 98 % (08/07 0500) Last BM Date : 01/08/24 (brown liquid)  Intake/Output from previous day: 08/06 0701 - 08/07 0700 In: 1501.5 [P.O.:240; I.V.:1261.5] Out: 650 [Urine:400; Emesis/NG output:150; Stool:100] Intake/Output this shift: No intake/output data recorded.  General appearance: alert, cooperative, and no distress GI: Abdomen soft, nontender, no percussion tenderness, nontender to palpation; no rigidity, guarding, rebound tenderness; midline incision C/C/I with skin staples in place, right sided ostomy with brown-black sloughing of the mucosa, test tube in os demonstrated pink mucosa just beneath the surface of sloughing   Lab Results:  Recent Labs    01/08/24 0502 01/09/24 0735  WBC 8.0 8.7  HGB 9.4* 8.0*  HCT 29.4* 25.2*  PLT 121* 94*   BMET Recent Labs    01/08/24 0502 01/09/24 0735  NA 142 142  K 5.0 4.0  CL 108 109  CO2 22 23  GLUCOSE 215* 119*  BUN 11 11  CREATININE 0.74 0.70  CALCIUM  8.3* 8.2*   PT/INR No results for input(s): LABPROT, INR in the last 72 hours.  Studies/Results: DG Chest Port 1 View Result Date: 01/07/2024 CLINICAL DATA:  [id number] Encounter for nasogastric (NG) tube placement [id number]  EXAM: PORTABLE CHEST 1 VIEW COMPARISON:  Chest x-ray 02/16/2022, CT chest 03/06/2022 FINDINGS: Enteric tube with tip overlying the gastric lumen and side port overlying the expected region of the distal esophageal lumen. Lower lung zones are visualized and grossly unremarkable. Upper lung zones are collimated off view. Nonobstructive bowel gas pattern. No acute osseous abnormality. Multiple calcified stone within the right upper quadrant consistent with cholelithiasis. IMPRESSION: 1. Enteric tube with tip overlying the gastric lumen and side port overlying the expected region of the distal esophageal lumen. Recommend advancing by 8 cm. 2. Cholelithiasis. Electronically Signed   By: Morgane  Naveau M.D.   On: 01/07/2024 19:03    Anti-infectives: Anti-infectives (From admission, onward)    Start     Dose/Rate Route Frequency Ordered Stop   01/08/24 0600  ceFAZolin  (ANCEF ) IVPB 2g/100 mL premix        2 g 200 mL/hr over 30 Minutes Intravenous On call to O.R. 01/07/24 1323 01/07/24 1448   01/07/24 1323  ceFAZolin  (ANCEF ) 2-4 GM/100ML-% IVPB       Note to Pharmacy: Durenda Clotilda HERO: cabinet override      01/07/24 1323 01/07/24 1455       Assessment/Plan:  Patient is a 53 year old female who was admitted with concern for malignant small bowel obstruction.  She is status post exploratory lap, lysis of adhesions, small bowel resection, and creation of a palliative end ileostomy on 8/5.  -Patient still without leukocytosis.  Hemoglobin dropped to 8 from 9.4.  Will  continue to clinically monitor with AM blood work -With patient having ostomy output, will discontinue NG tube and initiate a clear liquid diet -Will need to monitor amount of ostomy output.  May need to institute antidiarrheal medications pending amount of ostomy output -Test tube test to evaluate os of ostomy.  Patient noted to have pink mucosa just below the black sloughing layer.  Photodocumentation above -Discussed with the patient  and her husband that this top layer will eventually slough off.  I do not feel the patient needs any further operations for this ostomy at this time.  Will continue to monitor -IV fluids per primary team -Appreciate palliative care and oncology recommendations -Appreciate hospitalist recommendations   LOS: 2 days    Nicole Santos A Nicole Santos 01/09/2024  Note: Portions of this report may have been transcribed using voice recognition software. Every effort has been made to ensure accuracy; however, inadvertent computerized transcription errors may still be present.

## 2024-01-10 DIAGNOSIS — K56609 Unspecified intestinal obstruction, unspecified as to partial versus complete obstruction: Secondary | ICD-10-CM | POA: Diagnosis not present

## 2024-01-10 DIAGNOSIS — R52 Pain, unspecified: Secondary | ICD-10-CM

## 2024-01-10 DIAGNOSIS — Z515 Encounter for palliative care: Secondary | ICD-10-CM

## 2024-01-10 DIAGNOSIS — Z7189 Other specified counseling: Secondary | ICD-10-CM | POA: Diagnosis not present

## 2024-01-10 LAB — CBC
HCT: 23.7 % — ABNORMAL LOW (ref 36.0–46.0)
Hemoglobin: 7.5 g/dL — ABNORMAL LOW (ref 12.0–15.0)
MCH: 36.8 pg — ABNORMAL HIGH (ref 26.0–34.0)
MCHC: 31.6 g/dL (ref 30.0–36.0)
MCV: 116.2 fL — ABNORMAL HIGH (ref 80.0–100.0)
Platelets: 85 K/uL — ABNORMAL LOW (ref 150–400)
RBC: 2.04 MIL/uL — ABNORMAL LOW (ref 3.87–5.11)
RDW: 24.9 % — ABNORMAL HIGH (ref 11.5–15.5)
WBC: 7.9 K/uL (ref 4.0–10.5)
nRBC: 0.3 % — ABNORMAL HIGH (ref 0.0–0.2)

## 2024-01-10 LAB — IRON AND TIBC
Iron: 19 ug/dL — ABNORMAL LOW (ref 28–170)
Saturation Ratios: 19 % (ref 10.4–31.8)
TIBC: 100 ug/dL — ABNORMAL LOW (ref 250–450)
UIBC: 81 ug/dL

## 2024-01-10 LAB — FOLATE: Folate: 8.9 ng/mL (ref >5.9)

## 2024-01-10 LAB — HEMOGLOBIN AND HEMATOCRIT, BLOOD
HCT: 22.3 % — ABNORMAL LOW (ref 36.0–46.0)
Hemoglobin: 7 g/dL — ABNORMAL LOW (ref 12.0–15.0)

## 2024-01-10 LAB — VITAMIN B12: Vitamin B-12: 496 pg/mL (ref 180–914)

## 2024-01-10 LAB — PREPARE RBC (CROSSMATCH)

## 2024-01-10 LAB — SURGICAL PATHOLOGY

## 2024-01-10 MED ORDER — METHOCARBAMOL 500 MG PO TABS
500.0000 mg | ORAL_TABLET | Freq: Three times a day (TID) | ORAL | Status: DC
  Administered 2024-01-10 – 2024-01-23 (×43): 500 mg via ORAL
  Filled 2024-01-10 (×39): qty 1

## 2024-01-10 MED ORDER — FUROSEMIDE 10 MG/ML IJ SOLN
20.0000 mg | Freq: Once | INTRAMUSCULAR | Status: AC
  Administered 2024-01-10: 20 mg via INTRAVENOUS
  Filled 2024-01-10: qty 2

## 2024-01-10 MED ORDER — BOOST / RESOURCE BREEZE PO LIQD CUSTOM
1.0000 | Freq: Three times a day (TID) | ORAL | Status: DC
  Administered 2024-01-10 – 2024-01-15 (×7): 1 via ORAL

## 2024-01-10 MED ORDER — SODIUM CHLORIDE 0.9% IV SOLUTION: Freq: Once | INTRAVENOUS | Status: AC

## 2024-01-10 MED ORDER — DIPHENHYDRAMINE HCL 25 MG PO CAPS
25.0000 mg | ORAL_CAPSULE | Freq: Once | ORAL | Status: AC
  Administered 2024-01-10: 25 mg via ORAL
  Filled 2024-01-10: qty 1

## 2024-01-10 MED ORDER — PSYLLIUM 95 % PO PACK
1.0000 | PACK | Freq: Every day | ORAL | Status: DC
  Administered 2024-01-10 – 2024-01-15 (×9): 1 via ORAL
  Filled 2024-01-10 (×6): qty 1

## 2024-01-10 NOTE — Plan of Care (Signed)
  Problem: Clinical Measurements: Goal: Ability to maintain clinical measurements within normal limits will improve Outcome: Progressing Goal: Diagnostic test results will improve Outcome: Progressing   Problem: Nutrition: Goal: Adequate nutrition will be maintained Outcome: Progressing   Problem: Pain Managment: Goal: General experience of comfort will improve and/or be controlled Outcome: Progressing

## 2024-01-10 NOTE — Progress Notes (Signed)
 Daily Progress Note   Patient Name: Nicole Santos       Date: 01/10/2024 DOB: 04/25/1971  Age: 53 y.o. MRN#: [id number] Attending Physician: Willette Adriana LABOR, MD Primary Care Physician: Jolinda Norene HERO, DO Admit Date: 01/06/2024  Reason for Consultation/Follow-up: Establishing goals of care/symptom management   Subjective:  53 y.o. female  with past medical history of ovarian cancer (11/14/2021), peritoneal carcinomatosis, history of cervical cancer (2000), sleep apnea (not on CPAP), hypertension, depression, anxiety, history of PE, history of diabetes that resolved with weight loss admitted on 01/06/2024 with intractable nausea and vomiting.   Recurrent hospitalizations for SBO (most recent 11/2023). Managed conservatively. Symptoms improved with bowel rest.  She was discharged home.  She was followed by her GYN oncologist and medical oncologist in the interim.  Unfortunately, she had a worsening in her abdominal pain as well as some associated nausea and vomiting and was readmitted on 01/06/2024 she underwent exploratory laparotomy on 01/07/2024.  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  Surgical team unable to release any of her small bowel that was adherent to her pelvis and as such an end ileostomy was placed.  Noted to have numerous nodules throughout her abdomen and significant peritoneal carcinomatosis.  Admitted to the floor for further management and pain control.  Surgery recommends palliative referral for goals of care.   01/08/2024: Goals of care discussion completed.  Patient declines to make any changes to her advance directives at this time.  She is hopeful to hear from her oncology team about treatment options.  She also does not wish to make any changes to her advance directives without first discussing with  her entire family.  She will let us  know if she desires family meeting but declined need for 1 at this time.  We adjusted her pain regimen due to end of dose breakthrough pain.   01/09/2024: Symptom management reviewed. Symptoms stable/improving. Not interested in GOC/ACP discussion until able to speak with oncology.   Today, labs and diagnostics reviewed.  Renal function remained stable.  Mild hypocalcemia noted.  White count normal.  Progressive decline in hemoglobin noted.  Hemoglobin from 8/8 was 7 g/dL compared to 7.5 on 8/8.  Opioid use per 24 hours reviewed.  Appears she received a total of 2 mg of IV Dilaudid .  Vital signs appear relatively stable other than some mild tachycardia which is apparently baseline for patient (and not unexpected given hemoglobin levels).  Patient states that she is really only having back pain at this time.  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  She states that her abdominal and surgical pain is well-controlled.  She states her nausea and GERD symptoms have remained stable with discontinuation of NG tube.  She states that she has not worked with therapy or walked much but does feel that if she was able to get up and move around she would do better.  Patient states that she has family including a daughter and several grandchildren who can help her around her home if needed.  Daughter is at bedside and confirms willingness to help upon discharge.  She states that she has not spoken to Dr. Viktoria.  I again attempted to elicit goals of care and discussed anticipatory care planning.  Patient states she has still not talked to her entire family about the findings of her surgery and what this means for her.  We discussed the likely need for increased support for anticipatory care planning and symptom management as an outpatient.  She states she will consider outpatient palliative referral.  I again offered that if she wanted to  coordinate a family meeting I would be happy to assist and provide insight.  She states she will think about it but again states that she will not be pushed 1 way or another.  Provided reassurance that we are only trying to make sure she has all of the support she will need once she discharges back to the community.  Chart review/care coordination:  Completed extensive chart review including EPIC notes, labs (independently reviewed), vital signs, medication administration record. Coordinated care with admitting team, general surgery, nursing staff, and patient/family (daughter and several grandchildren at bedside).   Length of Stay: 3   Physical Exam Constitutional:      General: She is not in acute distress.    Appearance: She is not toxic-appearing.  Pulmonary:     Effort: Pulmonary effort is normal. No respiratory distress.  Skin:    General: Skin is warm and dry.  Neurological:     Mental Status: She is alert.             Vital Signs: BP 125/68 (BP Location: Right Arm)   Pulse (!) 109   Temp 97.9 F (36.6 C) (Oral)   Resp 17   Ht 5' 2 (1.575 m)   Wt 79.9 kg   SpO2 98%   BMI 32.22 kg/m  SpO2: SpO2: 98 % O2 Device: O2 Device: Room Air O2 Flow Rate:        Palliative Assessment/Data: 40 to 50%   Palliative Care Assessment & Plan   Patient Profile/Assessment:  53 y.o. female  with past medical history of ovarian cancer (11/14/2021), peritoneal carcinomatosis, history of cervical cancer (2000), sleep apnea (not on CPAP), hypertension, depression, anxiety, history of PE, history of diabetes that resolved with weight loss admitted on 01/06/2024 with intractable nausea and vomiting.  Diagnosed with recurrent small bowel obstruction (most recently hospitalized 6/25 with the same, managed conservatively).  Underwent exploratory laparotomy on 01/07/2024.  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  As such, a palliative end ileostomy was  placed.  Goals of care discussion completed on 01/08/2024.  Patient states that she wants to hear from oncology regarding her treatment options and discuss with her entire family before making any changes to her advance directives.  Symptom medications were adjusted on 01/08/2024 to address some end of dose breakthrough pain.  Pain, nausea, and GERD symptoms have improved.  We discussed the need to transition her to oral opioids in anticipation of discharge home.  I informed her that she does have oral oxycodone  that can be used as needed.  I encouraged her to try the oral oxycodone  instead of the IV hydromorphone  when she has pain.  She is agreeable to doing this.  She continues to tolerate clear liquid diet.  She states she has still not heard from her full oncology team and has not had a chance to meet with all of the adult members of her family therefore, she does not wish to make any adjustments to her advance directives or proceed with goals of care/advance care planning at this time.  We did discuss outpatient palliative referral which she is considering.   Recommendations/Plan:  Full code/full scope Transition to oral opioids for pain management in anticipation of eventual discharge home Continue close monitoring of symptoms and adjust regimen as indicated to optimize symptom management PT/OT consult to evaluate mobility status and any postdischarge needs Patient considering outpatient palliative referral Palliative medicine team to continue to follow for symptom management and goals of care, patient will consider if she desires to have a family meeting for advance care planning and will let me know if so so that we can coordinate   Symptom management:  Continue Tylenol  1000 mg p.o. every 6 hours Continue hydromorphone  0.5 mg IV every 3 hours as needed Continue Robaxin  500 mg IV every 8 hours Continue Reglan  10 mg every 8 hours Continue Zofran  4 mg every 8 hours as needed Continue oxycodone  5  mg every 4 hours as needed (emphasized the importance of trying to use oral oxycodone  for pain management instead of IV Dilaudid , patient agreeable)  Prognosis:  < 12 months    Discharge Planning: To Be Determined    Detailed review of medical records (labs, imaging, vital signs), medically appropriate exam, discussed with treatment team, counseling and education to patient, family, & staff, documenting clinical information, medication management, coordination of care   Billing based on MDM: High  Problems Addressed: One acute or chronic illness or injury that poses a threat to life or bodily function  Amount and/or Complexity of Data: Category 2:Independent interpretation of a test performed by another physician/other qualified health care professional (not separately reported)  Risks: Parenteral controlled substances         Laymon CHRISTELLA Pinal, NP  Palliative Medicine Team Team phone # [phone]  Thank you for allowing the Palliative Medicine Team to assist in the care of this patient. Please utilize secure chat with additional questions, if there is no response within 30 minutes please call the above phone number.  Palliative Medicine Team providers are available by phone from 7am to 7pm daily and can be reached through the team cell phone.  Should this patient require assistance outside of these hours, please call the patient's attending physician.

## 2024-01-10 NOTE — Progress Notes (Signed)
 Progress Note   Patient: Nicole Santos FMW:[id number] DOB: Oct 12, 1970 DOA: 01/06/2024     3 DOS: the patient was seen and examined on 01/10/2024   Brief hospital admission narrative course: As per H&P written by Dr. Pearlean on 01/06/2024 Nicole Santos is a 53 y.o. female with medical history significant for ovarian cancer, peritoneal carcinomatosis, pulmonary embolism, hypertension. Patient presented to the ED with complaints of worsening of her chronic abdominal pain that started 3 days ago, with vomiting.  She has been compliant with soft diet as recommended by her providers, but she reports persistent pain with oral intake.   Last hospitalization 11/04/2023 through 11/07/2023 with partial small bowel obstruction-managed conservatively   ED Course: Initial tachycardia heart rate up to 127 now improved. CT AP with contrast-suggest small bowel obstruction. EDP talked to general surgeon Dr. Evonnie, will see in consult, can hold NG tube as patient has not had any vomiting in 2 days.  01/07/2024 status post exploratory laparotomy, lysis of adhesion, small bowel resection,  ileostomy creation the    Subjective : The patient was seen and examined this morning, stable no acute distress, laying in bed and comfortably Noted and discussed with patient regarding drop in her hemoglobin, no complaint of bleeding, negative for any blood in her ostomy bag Complains of some generalized weakness but denies any dizziness or chest pain  Hemoglobin down to 7.0     Assessment and plan  Small bowel obstruction Postop day #3 01/07/2024 status post exploratory laparotomy, lysis of adhesion, small bowel resection,  ileostomy creation -Tachycardic with heart rate 109, blood pressure at 125/68 satting 98% -Positive residual in ostomy bag -NG tube has been discontinued -Reporting improved nausea and reflux - Continue IV Reglan  - Anticipating initiation of clear liquid diet by surgery team  -  Patient with  underlying history of ovarian cancer with peritoneal carcinomatosis, multiple hospitalization for that reason, SBO - Transition point appreciated on CT scan  - Discontinuing IVF, as patient will receive 2U PRBC  IV Protonix  --Surgery following closely -Will encourage ambulation, pending bowel function return   Acute on chronic anemia with thrombocytopenia  - Exacerbated by postop anemia    Latest Ref Rng & Units 01/10/2024    8:01 AM 01/10/2024    5:18 AM 01/09/2024    7:35 AM  CBC  WBC 4.0 - 10.5 K/uL  7.9  8.7   Hemoglobin 12.0 - 15.0 g/dL 7.0  7.5  8.0   Hematocrit 36.0 - 46.0 % 22.3  23.7  25.2   Platelets 150 - 400 K/uL  85  94   Symptomatic - As patient is complaining of generalized weakness, tachycardia - Discussed with patient and general surgery will proceed with 2U PRBC blood transfusion today 01/10/2024  Chronic thrombocytopenia - Monitoring platelets, stable - No prior bleeding appreciated - Continue to follow trend.  Pulmonary embolism (HCC) -Continue holding Eliquis   - SCDs  Essential hypertension - N.p.o. for now, monitoring BP closely, as needed hydralazine - BP remains stable  Hyperlipidemia - Planning to resume statin when able to tolerate by mouth.   Primary peritoneal carcinomatosis (HCC) -Continue outpatient follow-up [facility]adda[facility]  - It appears that has possible metastasized - Palliative care consulted   Class I obesity -Body mass index is 32.22 kg/m.  -Low-calorie diet, portion control discussed with patient.   Physical Exam: Blood pressure 125/68, pulse (!) 109, temperature 97.9 F (36.6 C), temperature source Oral, resp. rate 17, height  5' 2 (1.575 m), weight 79.9 kg, SpO2 98%.  General:  AAO x 3,  cooperative, no distress;   HEENT:  Normocephalic, PERRL, otherwise with in Normal limits   Neuro:  CNII-XII intact. , normal motor and sensation, reflexes intact   Lungs:   Clear to auscultation BL,  Respirations unlabored,  No wheezes / crackles  Cardio:    S1/S2, RRR, No murmure, No Rubs or Gallops   Abdomen:  Ileostomy present, functional void residual Liquid stool Soft, non-tender, bowel sounds active all four quadrants, no guarding or peritoneal signs.  Muscular  skeletal:  Limited exam -global generalized weaknesses - in bed, able to move all 4 extremities,   2+ pulses,  symmetric, No pitting edema  Skin:  Dry, warm to touch, negative for any Rashes,  Wounds: Please see nursing documentation         Data Reviewed:    Latest Ref Rng & Units 01/10/2024    8:01 AM 01/10/2024    5:18 AM 01/09/2024    7:35 AM  CBC  WBC 4.0 - 10.5 K/uL  7.9  8.7   Hemoglobin 12.0 - 15.0 g/dL 7.0  7.5  8.0   Hematocrit 36.0 - 46.0 % 22.3  23.7  25.2   Platelets 150 - 400 K/uL  85  94       Latest Ref Rng & Units 01/09/2024    7:35 AM 01/08/2024    5:02 AM 01/07/2024    5:02 AM  CMP  Glucose 70 - 99 mg/dL 880  784  888   BUN 6 - 20 mg/dL 11  11  13    Creatinine 0.44 - 1.00 mg/dL 9.29  9.25  9.26   Sodium 135 - 145 mmol/L 142  142  141   Potassium 3.5 - 5.1 mmol/L 4.0  5.0  3.4   Chloride 98 - 111 mmol/L 109  108  105   CO2 22 - 32 mmol/L 23  22  24    Calcium  8.9 - 10.3 mg/dL 8.2  8.3  8.4      Family Communication: No family at bedside.  Disposition: Status is: Inpatient The patient will require care spanning > 2 midnights and should be moved to inpatient because: Continue IV therapy, electrolyte repletion and anticipate longer hospitalization while pursuing surgical intervention for SBO.  Anticipating discharge back home once medically stable.  Time spent: 50 minutes  Author: [name], MD 01/10/2024 12:15 PM

## 2024-01-10 NOTE — Plan of Care (Signed)

## 2024-01-10 NOTE — Progress Notes (Signed)
 [facility] Progress Note  3 Days Post-Op  Subjective: Patient seen and examined.  She is resting comfortably in bed.  Her biggest complaint is back pain.  She denies any significant abdominal pain.  She tolerated clear liquids without nausea and vomiting, though she did not like the soda.  She is having liquid ostomy output.  Objective: Vital signs in last 24 hours: Temp:  [97.5 F (36.4 C)-98 F (36.7 C)] 97.9 F (36.6 C) (08/08 0456) Pulse Rate:  [106-110] 109 (08/08 0456) Resp:  [16-17] 17 (08/08 0456) BP: (113-125)/(61-68) 125/68 (08/08 0456) SpO2:  [97 %-98 %] 98 % (08/08 0456) Last BM Date : 01/09/24  Intake/Output from previous day: 08/07 0701 - 08/08 0700 In: 1080.9 [P.O.:360; I.V.:720.9] Out: 750 [Urine:500; Stool:250] Intake/Output this shift: No intake/output data recorded.  General appearance: alert, cooperative, and no distress GI: Abdomen soft, nondistended, no percussion tenderness, nontender to palpation; no rigidity, guarding, rebound tenderness; midline incision C/D/I with skin staples in place, right sided ostomy with black sloughing of mucosa, underlying mucosa is pink, liquid stool in bag  Lab Results:  Recent Labs    01/09/24 0735 01/10/24 0518 01/10/24 0801  WBC 8.7 7.9  --   HGB 8.0* 7.5* 7.0*  HCT 25.2* 23.7* 22.3*  PLT 94* 85*  --    BMET Recent Labs    01/08/24 0502 01/09/24 0735  NA 142 142  K 5.0 4.0  CL 108 109  CO2 22 23  GLUCOSE 215* 119*  BUN 11 11  CREATININE 0.74 0.70  CALCIUM  8.3* 8.2*   PT/INR No results for input(s): LABPROT, INR in the last 72 hours.  Studies/Results: No results found.  Anti-infectives: Anti-infectives (From admission, onward)    Start     Dose/Rate Route Frequency Ordered Stop   01/08/24 0600  ceFAZolin  (ANCEF ) IVPB 2g/100 mL premix        2 g 200 mL/hr over 30 Minutes Intravenous On call to O.R. 01/07/24 1323 01/07/24 1448   01/07/24 1323  ceFAZolin  (ANCEF ) 2-4  GM/100ML-% IVPB       Note to Pharmacy: Durenda Clotilda HERO: cabinet override      01/07/24 1323 01/07/24 1455       Assessment/Plan:  Patient is a 53 year old female who was admitted with concern for malignant small bowel obstruction.  She is status post exploratory lap, lysis of adhesions, small bowel resection, and creation of a palliative end ileostomy on 8/5.   -Patient still without leukocytosis.   -Hemoglobin dropped to 7 this morning.  Will order 1 unit PRBCs -Advance to full liquid diet -Add oral fiber -Will need to monitor amount of ostomy output.  May need to institute antidiarrheal medications pending amount of ostomy output -Test tube test to evaluate os of ostomy- Patient noted to have pink mucosa just below the black sloughing layer. Will continue to monitor -IV fluids per primary team -Appreciate palliative care and oncology recommendations -Appreciate hospitalist recommendations   LOS: 3 days    Tasman Zapata A Tomy Khim 01/10/2024  Note: Portions of this report may have been transcribed using voice recognition software. Every effort has been made to ensure accuracy; however, inadvertent computerized transcription errors may still be present.

## 2024-01-11 DIAGNOSIS — K56609 Unspecified intestinal obstruction, unspecified as to partial versus complete obstruction: Secondary | ICD-10-CM | POA: Diagnosis not present

## 2024-01-11 LAB — CBC
HCT: 41.2 % (ref 36.0–46.0)
Hemoglobin: 13.7 g/dL (ref 12.0–15.0)
MCH: 33.4 pg (ref 26.0–34.0)
MCHC: 33.3 g/dL (ref 30.0–36.0)
MCV: 100.5 fL — ABNORMAL HIGH (ref 80.0–100.0)
Platelets: 77 K/uL — ABNORMAL LOW (ref 150–400)
RBC: 4.1 MIL/uL (ref 3.87–5.11)
RDW: 27.1 % — ABNORMAL HIGH (ref 11.5–15.5)
WBC: 9.6 K/uL (ref 4.0–10.5)
nRBC: 0.9 % — ABNORMAL HIGH (ref 0.0–0.2)

## 2024-01-11 LAB — BPAM RBC
Blood Product Expiration Date: 202508302359
Blood Product Expiration Date: 202508312359
ISSUE DATE / TIME: 202508081259
ISSUE DATE / TIME: 202508081631
Unit Type and Rh: 1700
Unit Type and Rh: 1700

## 2024-01-11 LAB — TYPE AND SCREEN
ABO/RH(D): B NEG
Antibody Screen: NEGATIVE
Unit division: 0
Unit division: 0

## 2024-01-11 MED ORDER — DEXTROSE-SODIUM CHLORIDE 5-0.45 % IV SOLN: INTRAVENOUS | Status: DC

## 2024-01-11 MED ORDER — LOPERAMIDE HCL 2 MG PO CAPS
2.0000 mg | ORAL_CAPSULE | Freq: Every day | ORAL | Status: DC
  Administered 2024-01-11 – 2024-01-23 (×15): 2 mg via ORAL
  Filled 2024-01-11 (×12): qty 1

## 2024-01-11 NOTE — Plan of Care (Signed)

## 2024-01-11 NOTE — Progress Notes (Signed)
 Progress Note   Patient: Nicole Santos FMW:[id number] DOB: 13-Jul-1970 DOA: 01/06/2024     4 DOS: the patient was seen and examined on 01/11/2024   Brief hospital admission narrative course: As per H&P written by Dr. Pearlean on 01/06/2024 Nicole Santos is a 53 y.o. female with medical history significant for ovarian cancer, peritoneal carcinomatosis, pulmonary embolism, hypertension. Patient presented to the ED with complaints of worsening of her chronic abdominal pain that started 3 days ago, with vomiting.  She has been compliant with soft diet as recommended by her providers, but she reports persistent pain with oral intake.   Last hospitalization 11/04/2023 through 11/07/2023 with partial small bowel obstruction-managed conservatively   ED Course: Initial tachycardia heart rate up to 127 now improved. CT AP with contrast-suggest small bowel obstruction. EDP talked to general surgeon Dr. Evonnie, will see in consult, can hold NG tube as patient has not had any vomiting in 2 days.  01/07/2024 status post exploratory laparotomy, lysis of adhesion, small bowel resection,  ileostomy creation the    Subjective : Postop day #4 The patient was seen and examined, stable no acute distress sitting in chair NG tube remains out, denies any nausea vomiting, improved abdominal pain -Reporting of minimal oral intake, reduced appetite. Tolerated blood transfusion      Assessment and plan  Small bowel obstruction Postop day #4 01/07/2024 status post exploratory laparotomy, lysis of adhesion, small bowel resection,  ileostomy creation -Tachycardic with heart rate of 114-100 otherwise hemodynamically stable blood pressure soft at 112/70, satting 93% room air -Reported dark ?  Blood in urine   -Positive residual in ostomy bag -liquid fluid output - D/C NG tube since 01/10/2024 -Reporting improved nausea and reflux - Discontinued Reglan , as needed Zofran  -Increased input per ileostomy, Imodium  added by  general surgery - Anticipating initiation of clear liquid diet by surgery team  -  Patient with underlying history of ovarian cancer with peritoneal carcinomatosis, multiple hospitalization for that reason, SBO - Transition point appreciated on CT scan  --S/p transfusion of 2U PRBC on 01/10/2024  - IV Protonix  --Surgery following closely -Will encourage ambulation, pending bowel function return  -Due to poor p.o. intake, poor oral hydration, reinitiating IVF   Acute on chronic anemia with thrombocytopenia  - Exacerbated by postop anemia    Latest Ref Rng & Units 01/11/2024    4:13 AM 01/10/2024    8:01 AM 01/10/2024    5:18 AM  CBC  WBC 4.0 - 10.5 K/uL 9.6   7.9   Hemoglobin 12.0 - 15.0 g/dL 86.2  7.0  7.5   Hematocrit 36.0 - 46.0 % 41.2  22.3  23.7   Platelets 150 - 400 K/uL 77   85   Symptomatic - As patient is complaining of generalized weakness, tachycardia - S/p 2U PRBC blood transfusion  01/10/2024  Chronic thrombocytopenia - Monitoring platelets, stable - No prior bleeding appreciated - Continue to follow trend. - Continue to hold Eliquis   Pulmonary embolism (HCC) -Continue holding Eliquis    ?  Hematuria  - Checking UA, continue to hold Eliquis    Essential hypertension - Poor oral intake on clear liquids - BP remains soft,-otherwise stable-monitoring closely as needed hydralazine   Hyperlipidemia - Planning to resume statin when able to tolerate by mouth.   Primary peritoneal carcinomatosis (HCC) -With possible new mets -[facility] Rogers[facility]  - Palliative care consulted -patient requested full scope of care including full code  Class I obesity -Body mass index is 32.22 kg/m.  -Low-calorie diet, portion control discussed with patient.    Physical Exam: Blood pressure 112/70, pulse 100, temperature 98.8 F (37.1 C), resp. rate 18, height 5' 2 (1.575 m), weight 79.9 kg, SpO2 93%.   General:   AAO x 3,  cooperative, no distress;   HEENT:  Normocephalic, PERRL, otherwise with in Normal limits   Neuro:  CNII-XII intact. , normal motor and sensation, reflexes intact   Lungs:   Clear to auscultation BL, Respirations unlabored,  No wheezes / crackles  Cardio:    S1/S2, RRR, No murmure, No Rubs or Gallops   Abdomen:  Surgical abdominal wound with staples  Ileostomy in place, functional with bag -residual liquid fluid  soft, tender with palpation, bowel sounds active all four quadrants, no guarding or peritoneal signs.  Muscular  skeletal:  Limited exam -global generalized weaknesses - in bed, able to move all 4 extremities,   2+ pulses,  symmetric, No pitting edema  Skin:  Dry, warm to touch, negative for any Rashes, Abdominal surgical wound clean, staples in place, ileostomy functional  Wounds: Please see nursing documentation             Data Reviewed:    Latest Ref Rng & Units 01/11/2024    4:13 AM 01/10/2024    8:01 AM 01/10/2024    5:18 AM  CBC  WBC 4.0 - 10.5 K/uL 9.6   7.9   Hemoglobin 12.0 - 15.0 g/dL 86.2  7.0  7.5   Hematocrit 36.0 - 46.0 % 41.2  22.3  23.7   Platelets 150 - 400 K/uL 77   85       Latest Ref Rng & Units 01/09/2024    7:35 AM 01/08/2024    5:02 AM 01/07/2024    5:02 AM  CMP  Glucose 70 - 99 mg/dL 880  784  888   BUN 6 - 20 mg/dL 11  11  13    Creatinine 0.44 - 1.00 mg/dL 9.29  9.25  9.26   Sodium 135 - 145 mmol/L 142  142  141   Potassium 3.5 - 5.1 mmol/L 4.0  5.0  3.4   Chloride 98 - 111 mmol/L 109  108  105   CO2 22 - 32 mmol/L 23  22  24    Calcium  8.9 - 10.3 mg/dL 8.2  8.3  8.4      Family Communication: No family at bedside.  Disposition: Status is: Inpatient The patient will require care spanning > 2 midnights and should be moved to inpatient because: Continue IV therapy, electrolyte repletion and anticipate longer hospitalization while pursuing surgical intervention for SBO.  Anticipating discharge back home once medically  stable.  Time spent: 50 minutes  Author: [name], MD 01/11/2024 1:04 PM

## 2024-01-11 NOTE — Discharge Instructions (Signed)
 Ileostomy Output:  Monitor how much output you have from your ileostomy a day and drink at least twice that amount in fluids as you will also have losses from sweat and urine.  Example: 1 L output you need 2 L of intake   If you are having more than 1 liter of output a day, please start these medications in a stepwise fashion. Metamucil 34 g packet in 30 mL of water daily.  Imodium 4 mg four times a day; 30 minutes before meals and before bed.  Lomotil 2.5 mg four times a day; 30 minutes before meals and before bed.  Cholestyramine 4 g four times a day; 30 minutes before meals and before bed.  (You will need prescriptions for these medications.) - You may need to adjust these medications as you have less output.   Diet Changes: - Chew food well.  - Eat low sugar foods and drinks.  - Eat salty foods and add salt to meals and snacks.  - Eat smaller more frequent meals and snacks.  - Drink fluids  hour before or after meals, not with food.  - Avoid alcohol and caffeine.  - Eat more soluble fiber which forms a gel when mixed with water and slows movement.  - Good sources of soluble fiber include: peeled sweet potatoes, applesauce, refried beans, wheat and oat bran. O  Try adding these foods that naturally thicken stool to your meals: o - Applesauce  - Cream of rice  - Peanut butter (creamy)  - Bananas  - Marshmallows - Rice  - Cheese  - Mashed potatoes  - Soda crackers  - Tapioca     Contact Information: If you are still having trouble with output, please call our office, [phone], Monday- Thursday 8AM-5PM and Friday 8AM-12Noon.  If it is after hours or on the weekend, please call [facility] Main [facility], [phone], [phone], and ask to speak to the surgeon on call for Dr. Robyne Peers at [facility].

## 2024-01-11 NOTE — Progress Notes (Signed)
 [facility] Progress Note  4 Days Post-Op  Subjective: Patient seen and examined.  She is sitting in a chair.  She did not tolerate any dairy with her full liquids yesterday, as this made her nauseous.  She was able to tolerate some chicken soup without issue.  Patient has little appetite today.  She is complaining of generalized pain, but this is controlled with medications.  She did have some bloody urine this morning, which she believes is her starting to get a UTI.  Her ileostomy has had 1.4 L of output in the last 24 hours.  Objective: Vital signs in last 24 hours: Temp:  [97.7 F (36.5 C)-98.8 F (37.1 C)] 97.7 F (36.5 C) (08/09 1412) Pulse Rate:  [100-122] 122 (08/09 1412) Resp:  [17-20] 20 (08/09 1412) BP: (101-122)/(62-81) 105/81 (08/09 1412) SpO2:  [92 %-98 %] 92 % (08/09 1412) Last BM Date : 01/11/24  Intake/Output from previous day: 08/08 0701 - 08/09 0700 In: 772 [Blood:772] Out: 3550 [Urine:2150; Stool:1400] Intake/Output this shift: No intake/output data recorded.  General appearance: alert, cooperative, and no distress GI: Abdomen soft, nondistended, no percussion tenderness, minimal incisional tenderness to palpation; no rigidity, guarding, rebound tenderness; midline incision C/D/I with skin staples in place, right sided ostomy with black sloughing of mucosa, underlying mucosa is pink, liquid stool in bag  Lab Results:  Recent Labs    01/10/24 0518 01/10/24 0801 01/11/24 0413  WBC 7.9  --  9.6  HGB 7.5* 7.0* 13.7  HCT 23.7* 22.3* 41.2  PLT 85*  --  77*   BMET Recent Labs    01/09/24 0735  NA 142  K 4.0  CL 109  CO2 23  GLUCOSE 119*  BUN 11  CREATININE 0.70  CALCIUM  8.2*   PT/INR No results for input(s): LABPROT, INR in the last 72 hours.  Studies/Results: No results found.  Anti-infectives: Anti-infectives (From admission, onward)    Start     Dose/Rate Route Frequency Ordered Stop   01/08/24 0600  ceFAZolin   (ANCEF ) IVPB 2g/100 mL premix        2 g 200 mL/hr over 30 Minutes Intravenous On call to O.R. 01/07/24 1323 01/07/24 1448   01/07/24 1323  ceFAZolin  (ANCEF ) 2-4 GM/100ML-% IVPB       Note to Pharmacy: Durenda Clotilda HERO: cabinet override      01/07/24 1323 01/07/24 1455       Assessment/Plan:  Patient is a 53 year old female who was admitted with concern for malignant small bowel obstruction.  She is status post exploratory lap, lysis of adhesions, small bowel resection, and creation of a palliative end ileostomy on 8/5.   -Patient's hemoglobin is 13.7 status post 2 units yesterday.  Suspect that the significant increase in hemoglobin is secondary to dehydration in addition to the 2 units she received.  Recheck with blood work tomorrow morning -Discussed reinitiation of IV fluids with the hospitalist, especially given decreased oral intake secondary to nausea with dairy yesterday -GI soft diet ordered today to see if the patient finds anything palatable.  Discussed that we can always drop her diet down if needed secondary to nausea and vomiting -Continue oral fiber.  Imodium  added today given greater than 1 L of ostomy output yesterday -Will need to monitor amount of ostomy output.  May need to institute antidiarrheal medications pending amount of ostomy output -Test tube test to evaluate os of ostomy- Patient noted to have pink mucosa just below the black sloughing layer. Will continue to  monitor -Appreciate palliative care and oncology recommendations -Appreciate hospitalist recommendations   LOS: 4 days    Nicole Santos 01/11/2024  Note: Portions of this report may have been transcribed using voice recognition software. Every effort has been made to ensure accuracy; however, inadvertent computerized transcription errors may still be present.

## 2024-01-11 NOTE — Plan of Care (Signed)
  Problem: Education: Goal: Knowledge of General Education information will improve Description: Including pain rating scale, medication(s)/side effects and non-pharmacologic comfort measures Outcome: Progressing   Problem: Clinical Measurements: Goal: Ability to maintain clinical measurements within normal limits will improve Outcome: Progressing   Problem: Clinical Measurements: Goal: Diagnostic test results will improve Outcome: Progressing   Problem: Activity: Goal: Risk for activity intolerance will decrease Outcome: Progressing   Problem: Nutrition: Goal: Adequate nutrition will be maintained Outcome: Progressing   Problem: Coping: Goal: Level of anxiety will decrease Outcome: Progressing

## 2024-01-11 NOTE — Progress Notes (Signed)
 PT Cancellation Note  Patient Details Name: Nicole Santos MRN: [id number] DOB: [birth year]   Cancelled Treatment:    Reason Eval/Treat Not Completed: Fatigue/lethargy limiting ability to participate.  Pt up in recliner therapist introduced self and explained that we want to try and increase pt activity level.  Pt states she is to tired as she did not sleep at all last night.  Therapist suggested just going for a small walk, pt states she did that last night.  Therapist suggested getting up walking to her tolerance and then go back to bed as pt is in a recliner so she could take a nap.  Pt states that she can not lie in the bed as it is to uncomfortable she wants to stay in the recliner and her husband will assist her when he gets to the hospital.    Montie Metro, PT CLT [phone]  01/11/2024, 9:30 AM

## 2024-01-11 NOTE — Progress Notes (Signed)
   01/11/24 1412  Vitals  Temp 97.7 F (36.5 C)  BP 105/81  MAP (mmHg) 90  BP Location Right Wrist  BP Method Automatic  Patient Position (if appropriate) Lying  Pulse Rate (!) 122  Pulse Rate Source Monitor  Resp 20  MEWS COLOR  MEWS Score Color Yellow  Oxygen Therapy  SpO2 92 %  MEWS Score  MEWS Temp 0  MEWS Systolic 0  MEWS Pulse 2  MEWS RR 0  MEWS LOC 0  MEWS Score 2   Rechecked pulse via apical....112, still a yellow mews.

## 2024-01-11 NOTE — Evaluation (Signed)
 Physical Therapy Evaluation Patient Details Name: Nicole Santos MRN: [id number] DOB: [birth year] Today's Date: 01/11/2024  History of Present Illness  Per FI:Nicole Santos is a 53 y.o. female with a history of T2DM, CVA with residual deficit, stage IIIa CKD, pancreatic CA s/p Whipple resection, HTN, HLD, osteoporosis who presented to the ED from radiology due to being too weak to rise from her chair. She was hypotensive with SBP in 70's. She reports having 3-4 falls over the past month or so, increasing frequency and a fall last Wednesday particularly seemed to cause pain in her back and abdomen, her appetite has been very poor so her po intake has been minimal.      BP improved with IV fluids. Work up revealed lactic acidosis, UA suggestive of UTI, dehydration for which IV fluids and ceftriaxone  were given. Lactic acid has cleared. CT chest/abdomen/pelvis demonstrated a T9 fracture. Neurosurgery recommended TLSO brace, PT and outpatient follow up. The patient remains severely weak so admission is requested.  Clinical Impression  Pt very slow in all movement.  Fearful of falling.  Pt did not need any physical assistance from therapist but significant encouragement to walk.         If plan is discharge home, recommend the following: A little help with walking and/or transfers;A little help with bathing/dressing/bathroom;Assistance with cooking/housework;Assist for transportation;Help with stairs or ramp for entrance   Can travel by private vehicle    yes    Equipment Recommendations None recommended by PT  Recommendations for Other Services       Functional Status Assessment Patient has had a recent decline in their functional status and demonstrates the ability to make significant improvements in function in a reasonable and predictable amount of time.     Precautions / Restrictions Precautions Precautions: None Restrictions Weight Bearing Restrictions Per Provider Order: No      Mobility   Bed Mobility Overal bed mobility: Modified Independent             General bed mobility comments: increased time    Transfers Overall transfer level: Modified independent Equipment used: None               General transfer comment: increased time    Ambulation/Gait Ambulation/Gait assistance: Contact guard assist Gait Distance (Feet): 30 Feet Assistive device: Rolling walker (2 wheels) Gait Pattern/deviations: Decreased step length - right, Decreased step length - left Gait velocity: slow Gait velocity interpretation: <1.8 ft/sec, indicate of risk for recurrent falls            Pertinent Vitals/Pain Pain Assessment Pain Assessment: 0-10 Pain Score: 2  Pain Descriptors / Indicators: Aching Pain Intervention(s): Limited activity within patient's tolerance    Home Living Family/patient expects to be discharged to:: Private residence Living Arrangements: Spouse/significant other Available Help at Discharge: Family;Available PRN/intermittently Type of Home: House Home Access: Stairs to enter Entrance Stairs-Rails: None Entrance Stairs-Number of Steps: 1   Home Layout: One level Home Equipment: Rollator (4 wheels);BSC/3in1;Shower seat      Prior Function Prior Level of Function : Independent/Modified Independent             Mobility Comments: household ambulator without AD ADLs Comments: family assist     Extremity/Trunk Assessment        Lower Extremity Assessment Lower Extremity Assessment: Generalized weakness       Communication        Cognition Arousal: Alert Behavior During Therapy: WFL for tasks assessed/performed   PT - Cognitive  impairments: No apparent impairments                                 Cueing  Verbal            Assessment/Plan    PT Assessment Patient needs continued PT services  PT Problem List Decreased strength;Decreased activity tolerance;Decreased balance       PT Treatment  Interventions Functional mobility training;Therapeutic activities;Therapeutic exercise    PT Goals (Current goals can be found in the Care Plan section)       Frequency Min 2X/week        AM-PAC PT 6 Clicks Mobility  Outcome Measure Help needed turning from your back to your side while in a flat bed without using bedrails?: None Help needed moving from lying on your back to sitting on the side of a flat bed without using bedrails?: None Help needed moving to and from a bed to a chair (including a wheelchair)?: A Little Help needed standing up from a chair using your arms (e.g., wheelchair or bedside chair)?: A Little Help needed to walk in hospital room?: A Little Help needed climbing 3-5 steps with a railing? : A Lot 6 Click Score: 19    End of Session Equipment Utilized During Treatment: Gait belt Activity Tolerance: Patient limited by fatigue Patient left: in chair;with call bell/phone within reach Nurse Communication: Mobility status PT Visit Diagnosis: Muscle weakness (generalized) (M62.81)    Time: 1130-1156 PT Time Calculation (min) (ACUTE ONLY): 26 min   Charges:   PT Evaluation $PT Eval Low Complexity: 1 Low   PT General Charges $$ ACUTE PT VISIT: 1 Visit          Montie Metro, PT CLT [phone]  01/11/2024, 12:04 PM

## 2024-01-12 DIAGNOSIS — K56609 Unspecified intestinal obstruction, unspecified as to partial versus complete obstruction: Secondary | ICD-10-CM | POA: Diagnosis not present

## 2024-01-12 LAB — BASIC METABOLIC PANEL WITH GFR
Anion gap: 11 (ref 5–15)
BUN: 17 mg/dL (ref 6–20)
CO2: 28 mmol/L (ref 22–32)
Calcium: 7.6 mg/dL — ABNORMAL LOW (ref 8.9–10.3)
Chloride: 96 mmol/L — ABNORMAL LOW (ref 98–111)
Creatinine, Ser: 0.68 mg/dL (ref 0.44–1.00)
GFR, Estimated: >60 mL/min (ref >60)
Glucose, Bld: 139 mg/dL — ABNORMAL HIGH (ref 70–99)
Potassium: 2.7 mmol/L — CL (ref 3.5–5.1)
Sodium: 135 mmol/L (ref 135–145)

## 2024-01-12 LAB — URINALYSIS, ROUTINE W REFLEX MICROSCOPIC
Bacteria, UA: NONE SEEN
Glucose, UA: NEGATIVE mg/dL
Hgb urine dipstick: NEGATIVE
Ketones, ur: NEGATIVE mg/dL
Leukocytes,Ua: NEGATIVE
Nitrite: NEGATIVE
Protein, ur: 100 mg/dL — AB
Specific Gravity, Urine: 1.039 — ABNORMAL HIGH (ref 1.005–1.030)
pH: 5 (ref 5.0–8.0)

## 2024-01-12 LAB — CBC
HCT: 34.7 % — ABNORMAL LOW (ref 36.0–46.0)
Hemoglobin: 11.4 g/dL — ABNORMAL LOW (ref 12.0–15.0)
MCH: 33.2 pg (ref 26.0–34.0)
MCHC: 32.9 g/dL (ref 30.0–36.0)
MCV: 101.2 fL — ABNORMAL HIGH (ref 80.0–100.0)
Platelets: 60 K/uL — ABNORMAL LOW (ref 150–400)
RBC: 3.43 MIL/uL — ABNORMAL LOW (ref 3.87–5.11)
RDW: 25.8 % — ABNORMAL HIGH (ref 11.5–15.5)
WBC: 8 K/uL (ref 4.0–10.5)
nRBC: 0.4 % — ABNORMAL HIGH (ref 0.0–0.2)

## 2024-01-12 LAB — MAGNESIUM: Magnesium: 1.3 mg/dL — ABNORMAL LOW (ref 1.7–2.4)

## 2024-01-12 MED ORDER — METOCLOPRAMIDE HCL 5 MG/ML IJ SOLN
10.0000 mg | Freq: Once | INTRAMUSCULAR | Status: AC
  Administered 2024-01-12: 10 mg via INTRAVENOUS
  Filled 2024-01-12: qty 2

## 2024-01-12 MED ORDER — MAGNESIUM SULFATE 2 GM/50ML IV SOLN
INTRAVENOUS | Status: AC
  Administered 2024-01-12: 2 g via INTRAVENOUS
  Filled 2024-01-12: qty 50

## 2024-01-12 MED ORDER — KCL IN DEXTROSE-NACL 20-5-0.45 MEQ/L-%-% IV SOLN: INTRAVENOUS | Status: DC

## 2024-01-12 MED ORDER — MAGNESIUM SULFATE 2 GM/50ML IV SOLN
2.0000 g | Freq: Once | INTRAVENOUS | Status: AC
  Filled 2024-01-12: qty 50

## 2024-01-12 MED ORDER — POTASSIUM CHLORIDE 10 MEQ/100ML IV SOLN
10.0000 meq | INTRAVENOUS | Status: AC
  Administered 2024-01-12 (×4): 10 meq via INTRAVENOUS
  Filled 2024-01-12 (×4): qty 100

## 2024-01-12 NOTE — Plan of Care (Signed)

## 2024-01-12 NOTE — Progress Notes (Signed)
 [facility] Progress Note  5 Days Post-Op  Subjective: Patient seen and examined.  She is resting in chair.  She tolerated some of her breakfast this morning without nausea and vomiting.  She continues to have ileostomy output- 500 cc documented in last 24 hours with ~200 cc in bag currently.  She denies significant abdominal pain.  She believes that she still has some blood in her urine, though there is no sample to look at.  Objective: Vital signs in last 24 hours: Temp:  [97.7 F (36.5 C)-98.3 F (36.8 C)] 98.3 F (36.8 C) (08/10 0853) Pulse Rate:  [91-122] 100 (08/10 0853) Resp:  [18-20] 18 (08/10 0853) BP: (99-126)/(55-81) 110/68 (08/10 0853) SpO2:  [92 %-98 %] 98 % (08/10 0853) Last BM Date : 01/11/24  Intake/Output from previous day: 08/09 0701 - 08/10 0700 In: -  Out: 1375 [Urine:800; Stool:575] Intake/Output this shift: Total I/O In: -  Out: 200 [Stool:200]  General appearance: alert, cooperative, and no distress GI: Abdomen soft, nondistended, no percussion tenderness, minimal incisional tenderness to palpation; no rigidity, guarding, rebound tenderness; midline incision C/D/I with skin staples in place, right sided ostomy with black sloughing of mucosa, underlying mucosa is pink, liquid stool in bag  Lab Results:  Recent Labs    01/11/24 0413 01/12/24 0322  WBC 9.6 8.0  HGB 13.7 11.4*  HCT 41.2 34.7*  PLT 77* 60*   BMET Recent Labs    01/12/24 0322  NA 135  K 2.7*  CL 96*  CO2 28  GLUCOSE 139*  BUN 17  CREATININE 0.68  CALCIUM  7.6*   PT/INR No results for input(s): LABPROT, INR in the last 72 hours.  Studies/Results: No results found.  Anti-infectives: Anti-infectives (From admission, onward)    Start     Dose/Rate Route Frequency Ordered Stop   01/08/24 0600  ceFAZolin  (ANCEF ) IVPB 2g/100 mL premix        2 g 200 mL/hr over 30 Minutes Intravenous On call to O.R. 01/07/24 1323 01/07/24 1448   01/07/24 1323  ceFAZolin   (ANCEF ) 2-4 GM/100ML-% IVPB       Note to Pharmacy: Durenda Clotilda HERO: cabinet override      01/07/24 1323 01/07/24 1455       Assessment/Plan:  Patient is a 53 year old female who was admitted with concern for malignant small bowel obstruction.  She is status post exploratory lap, lysis of adhesions, small bowel resection, and creation of a palliative end ileostomy on 8/5.   -Patient's hemoglobin decreased to 11.4 from 13.7.  Will continue to monitor.  This was likely from equilibration -Continue IVF per hospitalist.  Would continue until patient has adequate oral intake -Continue GI soft diet -Continue oral fiber and imodium   -Will need to monitor amount of ostomy output.  May need to institute antidiarrheal medications pending amount of ostomy output -Test tube test to evaluate os of ostomy- Patient noted to have pink mucosa just below the black sloughing layer. Will continue to monitor -UA without evidence of infection and no RBCs.  Will defer hematuria/UTI workup/management to hospitalist -Appreciate palliative care and oncology recommendations -Appreciate hospitalist recommendations   LOS: 5 days    Millette Halberstam A Jaylee Freeze 01/12/2024  Note: Portions of this report may have been transcribed using voice recognition software. Every effort has been made to ensure accuracy; however, inadvertent computerized transcription errors may still be present.

## 2024-01-12 NOTE — Progress Notes (Signed)
 Progress Note   Patient: Nicole Santos FMW:[id number] DOB: 1970/12/29 DOA: 01/06/2024     5 DOS: the patient was seen and examined on 01/12/2024   Brief hospital admission narrative course: As per H&P written by Dr. Pearlean on 01/06/2024 Nicole Santos is a 53 y.o. female with medical history significant for ovarian cancer, peritoneal carcinomatosis, pulmonary embolism, hypertension. Patient presented to the ED with complaints of worsening of her chronic abdominal pain that started 3 days ago, with vomiting.  She has been compliant with soft diet as recommended by her providers, but she reports persistent pain with oral intake.   Last hospitalization 11/04/2023 through 11/07/2023 with partial small bowel obstruction-managed conservatively   ED Course: Initial tachycardia heart rate up to 127 now improved. CT AP with contrast-suggest small bowel obstruction. EDP talked to general surgeon Dr. Evonnie, will see in consult, can hold NG tube as patient has not had any vomiting in 2 days.  01/07/2024 status post exploratory laparotomy, lysis of adhesion, small bowel resection,  ileostomy creation the    Subjective :  Postop day #5 The patient was seen and examined this morning, stable no acute distress sitting up in chair, complaining of generalized  Reporting copious liquid fluid discharge in her ostomy-  Also reporting dark urine?  Blood      Assessment and plan  Small bowel obstruction Postop day #5 01/07/2024 status post exploratory laparotomy, lysis of adhesion, small bowel - Hemodynamically stable -Reported dark ?  Blood in urine  -Copious liquid discharge per ostomy   Intake/Output Summary (Last 24 hours) at 01/12/2024 1158 Last data filed at 01/12/2024 0941 Gross per 24 hour  Intake --  Output 1575 ml  Net -1575 ml     -Positive residual in ostomy bag -liquid fluid output - D/C NG tube since 01/10/2024 --Improved nausea vomiting-slow minimal but tolerating p.o. -Advancing diet  slowly -Reglan  discontinued, as needed Zofran  -Increased input per ileostomy, Imodium  added by general surgery     -  Patient with underlying history of ovarian cancer with peritoneal carcinomatosis, multiple hospitalization for that reason, SBO - Transition point appreciated on CT scan   --Surgery following closely -Will encourage ambulation, pending bowel function return  -Due to poor p.o. intake, poor oral hydration, reinitiating IVF   Acute on chronic anemia with thrombocytopenia  - Exacerbated by postop anemia    Latest Ref Rng & Units 01/12/2024    3:22 AM 01/11/2024    4:13 AM 01/10/2024    8:01 AM  CBC  WBC 4.0 - 10.5 K/uL 8.0  9.6    Hemoglobin 12.0 - 15.0 g/dL 88.5  86.2  7.0   Hematocrit 36.0 - 46.0 % 34.7  41.2  22.3   Platelets 150 - 400 K/uL 60  77    Symptomatic - As patient is complaining of generalized weakness, tachycardia - S/p 2U PRBC blood transfusion  01/10/2024 -  Chronic thrombocytopenia - Monitoring platelets, stable - No prior bleeding appreciated - Continue to follow trend. - Continue to hold Eliquis   Pulmonary embolism (HCC) -Continue holding Eliquis    Dysuria /?  Hematuria - UA: Amber, negative for nitrites, leukocytes, no bacteria WBC 11-20, RBC 6-10 continue to hold Eliquis  - No signs of infection   Essential hypertension - Poor oral intake on clear liquids - BP remains soft,-otherwise stable-monitoring closely as needed hydralazine   Hyperlipidemia - Planning to resume statin when able to tolerate by mouth.   Primary peritoneal carcinomatosis (HCC) -With possible new mets -Continue  outpatient follow[facility]da[facility]  - Palliative care consulted -patient requested full scope of care including full code   Hypokalemia  - Replating orally and with IV fluid - Checking and repleting magnesium  level   class I obesity -Body mass index is 32.22 kg/m.  -Low-calorie diet, portion control  discussed with patient.    -----------------------------------------------------------------------------------------------------------------Physical Exam: Blood pressure 110/68, pulse 100, temperature 98.3 F (36.8 C), temperature source Oral, resp. rate 18, height 5' 2 (1.575 m), weight 79.9 kg, SpO2 98%.  General:  AAO x 3,  cooperative, no distress;   HEENT:  Normocephalic, PERRL, otherwise with in Normal limits   Neuro:  CNII-XII intact. , normal motor and sensation, reflexes intact   Lungs:   Clear to auscultation BL, Respirations unlabored,  No wheezes / crackles  Cardio:    S1/S2, RRR, No murmure, No Rubs or Gallops   Abdomen:  Soft, non-tender, bowel sounds active all four quadrants, no guarding or peritoneal signs.  Muscular  skeletal:  Limited exam -global generalized weaknesses - in bed, able to move all 4 extremities,   2+ pulses,  symmetric, No pitting edema  Skin:  Dry, warm to touch, negative for any Rashes,  Wounds: Please see nursing documentation             Data Reviewed:    Latest Ref Rng & Units 01/12/2024    3:22 AM 01/11/2024    4:13 AM 01/10/2024    8:01 AM  CBC  WBC 4.0 - 10.5 K/uL 8.0  9.6    Hemoglobin 12.0 - 15.0 g/dL 88.5  86.2  7.0   Hematocrit 36.0 - 46.0 % 34.7  41.2  22.3   Platelets 150 - 400 K/uL 60  77        Latest Ref Rng & Units 01/12/2024    3:22 AM 01/09/2024    7:35 AM 01/08/2024    5:02 AM  CMP  Glucose 70 - 99 mg/dL 860  880  784   BUN 6 - 20 mg/dL 17  11  11    Creatinine 0.44 - 1.00 mg/dL 9.31  9.29  9.25   Sodium 135 - 145 mmol/L 135  142  142   Potassium 3.5 - 5.1 mmol/L 2.7  4.0  5.0   Chloride 98 - 111 mmol/L 96  109  108   CO2 22 - 32 mmol/L 28  23  22    Calcium  8.9 - 10.3 mg/dL 7.6  8.2  8.3      Family Communication: No family at bedside.  Disposition: Status is: Inpatient The patient will require care spanning > 2 midnights and should be moved to inpatient because: Continue IV therapy, electrolyte repletion and  anticipate longer hospitalization while pursuing surgical intervention for SBO.  Anticipating discharge back home once medically stable.  Time spent: 50 minutes  Author: [name], MD 01/12/2024 11:57 AM

## 2024-01-13 DIAGNOSIS — K56609 Unspecified intestinal obstruction, unspecified as to partial versus complete obstruction: Secondary | ICD-10-CM | POA: Diagnosis not present

## 2024-01-13 LAB — BASIC METABOLIC PANEL WITH GFR
Anion gap: 12 (ref 5–15)
BUN: 11 mg/dL (ref 6–20)
CO2: 25 mmol/L (ref 22–32)
Calcium: 7.6 mg/dL — ABNORMAL LOW (ref 8.9–10.3)
Chloride: 96 mmol/L — ABNORMAL LOW (ref 98–111)
Creatinine, Ser: 0.57 mg/dL (ref 0.44–1.00)
GFR, Estimated: >60 mL/min (ref >60)
Glucose, Bld: 113 mg/dL — ABNORMAL HIGH (ref 70–99)
Potassium: 3.4 mmol/L — ABNORMAL LOW (ref 3.5–5.1)
Sodium: 133 mmol/L — ABNORMAL LOW (ref 135–145)

## 2024-01-13 LAB — CBC
HCT: 33.4 % — ABNORMAL LOW (ref 36.0–46.0)
Hemoglobin: 11.2 g/dL — ABNORMAL LOW (ref 12.0–15.0)
MCH: 34 pg (ref 26.0–34.0)
MCHC: 33.5 g/dL (ref 30.0–36.0)
MCV: 101.5 fL — ABNORMAL HIGH (ref 80.0–100.0)
Platelets: 53 K/uL — ABNORMAL LOW (ref 150–400)
RBC: 3.29 MIL/uL — ABNORMAL LOW (ref 3.87–5.11)
RDW: 25.2 % — ABNORMAL HIGH (ref 11.5–15.5)
WBC: 6 K/uL (ref 4.0–10.5)
nRBC: 0.3 % — ABNORMAL HIGH (ref 0.0–0.2)

## 2024-01-13 MED ORDER — FAMOTIDINE IN NACL 20-0.9 MG/50ML-% IV SOLN: 20.0000 mg | Freq: Two times a day (BID) | INTRAVENOUS | Status: DC

## 2024-01-13 MED ORDER — KCL IN DEXTROSE-NACL 20-5-0.45 MEQ/L-%-% IV SOLN: INTRAVENOUS | Status: AC

## 2024-01-13 MED ORDER — POTASSIUM CHLORIDE CRYS ER 20 MEQ PO TBCR
20.0000 meq | EXTENDED_RELEASE_TABLET | Freq: Once | ORAL | Status: AC
  Administered 2024-01-13 (×2): 20 meq via ORAL
  Filled 2024-01-13: qty 1

## 2024-01-13 MED ORDER — FERROUS SULFATE 325 (65 FE) MG PO TABS
325.0000 mg | ORAL_TABLET | Freq: Three times a day (TID) | ORAL | Status: DC
  Administered 2024-01-13 – 2024-01-17 (×20): 325 mg via ORAL
  Filled 2024-01-13 (×13): qty 1

## 2024-01-13 MED ORDER — OXYCODONE HCL 5 MG PO TABS
10.0000 mg | ORAL_TABLET | ORAL | Status: DC | PRN
  Administered 2024-01-13 – 2024-01-20 (×9): 10 mg via ORAL
  Filled 2024-01-13 (×6): qty 2

## 2024-01-13 MED ORDER — PANTOPRAZOLE SODIUM 40 MG PO TBEC
40.0000 mg | DELAYED_RELEASE_TABLET | Freq: Every day | ORAL | Status: DC
  Administered 2024-01-13 – 2024-01-23 (×14): 40 mg via ORAL
  Filled 2024-01-13 (×11): qty 1

## 2024-01-13 MED ORDER — MEGESTROL ACETATE 400 MG/10ML PO SUSP
400.0000 mg | Freq: Every day | ORAL | Status: DC
  Administered 2024-01-13 – 2024-01-15 (×6): 400 mg via ORAL
  Filled 2024-01-13 (×9): qty 10

## 2024-01-13 MED ORDER — ACETAMINOPHEN 500 MG PO TABS
1000.0000 mg | ORAL_TABLET | Freq: Three times a day (TID) | ORAL | Status: DC
  Administered 2024-01-13 – 2024-01-23 (×35): 1000 mg via ORAL
  Filled 2024-01-13 (×30): qty 2

## 2024-01-13 MED ORDER — FAMOTIDINE IN NACL 20-0.9 MG/50ML-% IV SOLN
20.0000 mg | Freq: Two times a day (BID) | INTRAVENOUS | Status: DC
  Administered 2024-01-13 – 2024-01-23 (×24): 20 mg via INTRAVENOUS
  Filled 2024-01-13 (×19): qty 50

## 2024-01-13 NOTE — Progress Notes (Signed)
 Progress Note   Patient: Nicole Santos DOB: 1971/04/13 DOA: 01/06/2024     6 DOS: the patient was seen and examined on 01/13/2024   Brief hospital admission narrative course: As per H&P written by Nicole Santos on 01/06/2024 Nicole Santos is a 53 y.o. female with medical history significant for ovarian cancer, peritoneal carcinomatosis, pulmonary embolism, hypertension. Patient presented to the ED with complaints of worsening of her chronic abdominal pain that started 3 days ago, with vomiting.  She has been compliant with soft diet as recommended by her providers, but she reports persistent pain with oral intake.   Last hospitalization 11/04/2023 through 11/07/2023 with partial small bowel obstruction-managed conservatively   ED Course: Initial tachycardia heart rate up to 127 now improved. CT AP with contrast-suggest small bowel obstruction. EDP talked to general surgeon Nicole Santos, will see in consult, can hold NG tube as patient has not had any vomiting in 2 days.  01/07/2024 status post exploratory laparotomy, lysis of adhesion, small bowel resection,  ileostomy creation the    Subjective :  Postop day #5 The patient was seen and examined this morning, still oral intake is not adequate patient reporting developed abdominal pain with solids Still having lots of liquid output per her ileostomy     Assessment and plan  Small bowel obstruction Postop day #6   01/07/2024 status post exploratory laparotomy, lysis of adhesion, small bowel - Hemodynamically stable -Copious liquid discharge per ostomy   Intake/Output Summary (Last 24 hours) at 01/13/2024 1213 Last data filed at 01/13/2024 0303 Gross per 24 hour  Intake 585 ml  Output 100 ml  Net 485 ml     -Positive residual in ostomy bag -liquid fluid output - D/C NG tube since 01/10/2024 --Improved nausea vomiting-slow minimal but tolerating p.o. -Advancing diet slowly--reporting abdominal pain with solid food - As needed  Zofran , analgesics  -Increased input per ileostomy, Imodium  added by general surgery     -  Patient with underlying history of ovarian cancer with peritoneal carcinomatosis, multiple hospitalization for that reason, SBO, s/p small bowel resection, with ileostomy creation    -Will encourage ambulation, pending bowel function return -Due to poor p.o. intake, poor oral hydration, reinitiating IVF   Acute on chronic anemia with thrombocytopenia  - Exacerbated by postop anemia    Latest Ref Rng & Units 01/13/2024    3:54 AM 01/12/2024    3:22 AM 01/11/2024    4:13 AM  CBC  WBC 4.0 - 10.5 K/uL 6.0  8.0  9.6   Hemoglobin 12.0 - 15.0 g/dL 88.7  88.5  86.2   Hematocrit 36.0 - 46.0 % 33.4  34.7  41.2   Platelets 150 - 400 K/uL 53  60  77    --Hemodynamically stable now - S/p 2U PRBC blood transfusion  01/10/2024 - Monitoring H&H  Chronic thrombocytopenia - Monitoring platelets, stable - No prior bleeding appreciated - Continue to hold Eliquis   Pulmonary embolism (HCC) -Continue holding Eliquis    Dysuria /?  Hematuria - UA: Amber, negative for nitrites, leukocytes, no bacteria WBC 11-20, RBC 6-10 continue to hold Eliquis  - No signs of infection   Essential hypertension -hypotensive - Poor oral intake on clear liquids-encouraging oral intake - BP remains soft,-otherwise stable-monitoring closely -    Hyperlipidemia - Planning to resume statin when able to tolerate by mouth.   Primary peritoneal carcinomatosis (HCC) -With possible new mets -[facility]  - Palliative  care consulted -patient requested full scope of care including full code - Patient wishes to follow-up with Nicole Santos   Hypokalemia  -Persistent hypokalemia due to increased ostomy output - Replating orally and with IV fluid - Checking and repleting magnesium  level   class I obesity -Body mass index is 32.22 kg/m.  -Low-calorie diet,  portion control discussed with patient.    -----------------------------------------------------------------------------------------------------------------Physical Exam: Blood pressure (!) 93/53, pulse (!) 108, temperature 98.3 F (36.8 C), temperature source Oral, resp. rate 18, height 5' 2 (1.575 m), weight 79.9 kg, SpO2 99%.      General:  AAO x 3,  cooperative, no distress;   HEENT:  Normocephalic, PERRL, otherwise with in Normal limits   Neuro:  CNII-XII intact. , normal motor and sensation, reflexes intact   Lungs:   Clear to auscultation BL, Respirations unlabored,  No wheezes / crackles  Cardio:    S1/S2, RRR, No murmure, No Rubs or Gallops   Abdomen:  Soft, non-tender, bowel sounds active all four quadrants, no guarding or peritoneal signs.  Muscular  skeletal:  Abdominal ileostomy in place, with liquid fluid within the bag Limited exam -global generalized weaknesses - in bed, able to move all 4 extremities,   2+ pulses,  symmetric, No pitting edema  Skin:  Dry, warm to touch, negative for any Rashes,  Wounds: Please see nursing documentation        Data Reviewed:    Latest Ref Rng & Units 01/13/2024    3:54 AM 01/12/2024    3:22 AM 01/11/2024    4:13 AM  CBC  WBC 4.0 - 10.5 K/uL 6.0  8.0  9.6   Hemoglobin 12.0 - 15.0 g/dL 88.7  88.5  86.2   Hematocrit 36.0 - 46.0 % 33.4  34.7  41.2   Platelets 150 - 400 K/uL 53  60  77       Latest Ref Rng & Units 01/13/2024    3:54 AM 01/12/2024    3:22 AM 01/09/2024    7:35 AM  CMP  Glucose 70 - 99 mg/dL 886  860  880   BUN 6 - 20 mg/dL 11  17  11    Creatinine 0.44 - 1.00 mg/dL 9.42  9.31  9.29   Sodium 135 - 145 mmol/L 133  135  142   Potassium 3.5 - 5.1 mmol/L 3.4  2.7  4.0   Chloride 98 - 111 mmol/L 96  96  109   CO2 22 - 32 mmol/L 25  28  23    Calcium  8.9 - 10.3 mg/dL 7.6  7.6  8.2      Family Communication: No family at bedside.  Disposition: Status is: Inpatient The patient will require care spanning > 2  midnights and should be moved to inpatient because: Continue IV therapy, electrolyte repletion and anticipate longer hospitalization while pursuing surgical intervention for SBO.  Anticipating discharge back home once medically stable.  Time spent: 50 minutes  Author: [name], MD 01/13/2024 12:13 PM

## 2024-01-13 NOTE — TOC Progression Note (Signed)
 Transition of Care [facility]) - Progression Note    Patient Details  Name: Nicole Santos MRN: [id number] Date of Birth: [birth year]  Transition of Care [facility]) CM/SW Contact  Lucie Lunger, [place] Phone Number: 01/13/2024, 3:20 PM  Clinical Narrative:    CSW updated by Delon with [facility] that they may no longer be able to accept pts [facility] referral due to staffing this week. CSW awaiting final update. TOC to follow.     Barriers to Discharge: Continued Medical Work up               Expected Discharge Plan and Services                                               Social Drivers of Health (SDOH) Interventions SDOH Screenings   Food Insecurity: No Food Insecurity (01/06/2024)  Housing: Low Risk  (01/06/2024)  Transportation Needs: No Transportation Needs (01/06/2024)  Utilities: Not At Risk (01/06/2024)  Depression (PHQ2-9): Low Risk  (01/01/2024)  Tobacco Use: Low Risk  (01/07/2024)    Readmission Risk Interventions    11/06/2023    9:51 AM  Readmission Risk Prevention Plan  Transportation Screening Complete  Home Care Screening Complete  Medication Review (RN CM) Complete

## 2024-01-13 NOTE — Progress Notes (Signed)
 OT Cancellation Note  Patient Details Name: Nicole Santos MRN: [id number] DOB: 08/25/1970   Cancelled Treatment:    Reason Eval/Treat Not Completed: OT screened, no needs identified, will sign off. Pt reported she is up using the bathroom independently. Reported no known OT needs at this time with family to support at home if she does need assist. Pt reported no B UE issues either. Pt will be removed from the OT list at this time. Thank you.   Waymon Laser OT, MOT   Jayson Person 01/13/2024, 11:22 AM

## 2024-01-13 NOTE — Progress Notes (Addendum)
 [facility] Progress Note  6 Days Post-Op  Subjective: Patient seen and examined.  She is resting comfortably in bed.  After the small amount that she ate for breakfast, she did not have anything else to eat yesterday.  She has not eaten anything yet today, but states that her stomach is growling and she will try to eat lunch.  She has not had any protein shakes, as she does not tolerate these well.  She has had 300 cc of output from her ostomy in the last 24 hours.  Objective: Vital signs in last 24 hours: Temp:  [97.7 F (36.5 C)-98.6 F (37 C)] 98.3 F (36.8 C) (08/11 0829) Pulse Rate:  [93-109] 108 (08/11 0829) Resp:  [17-19] 18 (08/11 0829) BP: (93-122)/(53-76) 93/53 (08/11 0829) SpO2:  [97 %-100 %] 99 % (08/11 0829) Last BM Date : 01/12/24  Intake/Output from previous day: 08/10 0701 - 08/11 0700 In: 705 [P.O.:480; I.V.:225] Out: 300 [Stool:300] Intake/Output this shift: No intake/output data recorded.  General appearance: alert, cooperative, and no distress GI: Abdomen soft, nondistended, no percussion tenderness, nontender to palpation; no rigidity, guarding, rebound tenderness; midline incision C/D/I with skin staples in place, right sided ostomy with black sloughing of mucosa, underlying mucosa is pink, more formed stool in bag  Lab Results:  Recent Labs    01/12/24 0322 01/13/24 0354  WBC 8.0 6.0  HGB 11.4* 11.2*  HCT 34.7* 33.4*  PLT 60* 53*   BMET Recent Labs    01/12/24 0322 01/13/24 0354  NA 135 133*  K 2.7* 3.4*  CL 96* 96*  CO2 28 25  GLUCOSE 139* 113*  BUN 17 11  CREATININE 0.68 0.57  CALCIUM  7.6* 7.6*   PT/INR No results for input(s): LABPROT, INR in the last 72 hours.  Studies/Results: No results found.  Anti-infectives: Anti-infectives (From admission, onward)    Start     Dose/Rate Route Frequency Ordered Stop   01/08/24 0600  ceFAZolin  (ANCEF ) IVPB 2g/100 mL premix        2 g 200 mL/hr over 30 Minutes  Intravenous On call to O.R. 01/07/24 1323 01/07/24 1448   01/07/24 1323  ceFAZolin  (ANCEF ) 2-4 GM/100ML-% IVPB       Note to Pharmacy: Durenda Clotilda HERO: cabinet override      01/07/24 1323 01/07/24 1455       Assessment/Plan:  Patient is a 53 year old female who was admitted with concern for malignant small bowel obstruction.  She is status post exploratory lap, lysis of adhesions, small bowel resection, and creation of a palliative end ileostomy on 8/5.   -Patient's hemoglobin decreased to 11.2 from 11.4.  Will continue to monitor.   -Continue IVF per hospitalist.  Would continue until patient has adequate oral intake -Continue GI soft diet.  May advance to regular diet if there is something on this diet that the patient would tolerate eating -Continue oral fiber and imodium   -Will need to monitor amount of ostomy output.  May need to institute antidiarrheal medications pending amount of ostomy output -Test tube test to evaluate os of ostomy- Patient noted to have pink mucosa just below the black sloughing layer. Will continue to monitor -Protonix  ordered -Increased oxycodone  dose to 10 mg -Appreciate palliative care and oncology recommendations -Appreciate hospitalist recommendations   LOS: 6 days    Jakobee Brackins A Emory Gallentine 01/13/2024  Note: Portions of this report may have been transcribed using voice recognition software. Every effort has been made to ensure accuracy; however, inadvertent  computerized transcription errors may still be present.

## 2024-01-13 NOTE — Consult Note (Signed)
 WOC Nurse ostomy follow up Stoma type/location: RLQ ileostomy, high output.  Located in deep abdominal crease and has leaked a few times over the weekend.  Stomal assessment/size: 1 1/4 (smaller than last week) Nearly 100% clear of slough, revealing red moist mucosa.  Stoma is productive.  Photo in chart.  Peristomal assessment: Denuded skin, most significant along bottom half.    Treatment options for stomal/peristomal skin: Stoma powder and skin prep to peristomal skin. Apply piece of barrier ring to creasing at 3 and 9 o'clock to provide flat surface for pouching.  Additional barrier ring around stoma.  Needs belt for support Output liquid brown stool Ostomy pouching: 1pc.convex with barrier ring and belt Education provided: Performed pouch change and applied ostomy belt.   Had leaked over the weekend and different products had been tried. Patient feeling confused.  We emptied old pouch, cleaned end of pouch and rolled closed.  Patient removed old pouched and cleansed peristomal skin.  WE measured stoma (smaller today) and cut barrier to fit.  Patient applied barrier ring to creasing and around stoma with minimal assistance.  Patient applied pouch and warmed to promote seal.  We applied ostomy belt.  She feels more confident and will continue this process. SHe has information for the outpatient ostomy clinic if needed after discharge.  (Recommended)  Enrolled patient in [employer] Discharge program: Yes Will follow.  Darice Cooley MSN, RN, FNP-BC CWON Wound, Ostomy, Continence Nurse Outpatient [facility] [phone] Pager [phone]

## 2024-01-14 DIAGNOSIS — R630 Anorexia: Secondary | ICD-10-CM | POA: Diagnosis not present

## 2024-01-14 DIAGNOSIS — K56609 Unspecified intestinal obstruction, unspecified as to partial versus complete obstruction: Secondary | ICD-10-CM | POA: Diagnosis not present

## 2024-01-14 DIAGNOSIS — Z515 Encounter for palliative care: Secondary | ICD-10-CM | POA: Diagnosis not present

## 2024-01-14 DIAGNOSIS — Z7189 Other specified counseling: Secondary | ICD-10-CM | POA: Diagnosis not present

## 2024-01-14 LAB — CBC
HCT: 34.1 % — ABNORMAL LOW (ref 36.0–46.0)
Hemoglobin: 11.4 g/dL — ABNORMAL LOW (ref 12.0–15.0)
MCH: 33.7 pg (ref 26.0–34.0)
MCHC: 33.4 g/dL (ref 30.0–36.0)
MCV: 100.9 fL — ABNORMAL HIGH (ref 80.0–100.0)
Platelets: 59 K/uL — ABNORMAL LOW (ref 150–400)
RBC: 3.38 MIL/uL — ABNORMAL LOW (ref 3.87–5.11)
RDW: 24.2 % — ABNORMAL HIGH (ref 11.5–15.5)
WBC: 8.1 K/uL (ref 4.0–10.5)
nRBC: 0.6 % — ABNORMAL HIGH (ref 0.0–0.2)

## 2024-01-14 LAB — BASIC METABOLIC PANEL WITH GFR
Anion gap: 8 (ref 5–15)
BUN: 11 mg/dL (ref 6–20)
CO2: 22 mmol/L (ref 22–32)
Calcium: 7.8 mg/dL — ABNORMAL LOW (ref 8.9–10.3)
Chloride: 102 mmol/L (ref 98–111)
Creatinine, Ser: 0.55 mg/dL (ref 0.44–1.00)
GFR, Estimated: >60 mL/min (ref >60)
Glucose, Bld: 62 mg/dL — ABNORMAL LOW (ref 70–99)
Potassium: 3.9 mmol/L (ref 3.5–5.1)
Sodium: 132 mmol/L — ABNORMAL LOW (ref 135–145)

## 2024-01-14 MED ORDER — ALUM & MAG HYDROXIDE-SIMETH 200-200-20 MG/5ML PO SUSP
30.0000 mL | Freq: Four times a day (QID) | ORAL | Status: DC | PRN
  Administered 2024-01-14 (×2): 30 mL via ORAL
  Filled 2024-01-14: qty 30

## 2024-01-14 MED ORDER — APIXABAN 2.5 MG PO TABS
2.5000 mg | ORAL_TABLET | Freq: Two times a day (BID) | ORAL | Status: DC
  Administered 2024-01-14 – 2024-01-15 (×6): 2.5 mg via ORAL
  Filled 2024-01-14 (×3): qty 1

## 2024-01-14 MED ORDER — ATORVASTATIN CALCIUM 20 MG PO TABS
20.0000 mg | ORAL_TABLET | Freq: Every day | ORAL | Status: AC
Start: 2024-01-14 — End: ?
  Administered 2024-01-14 – 2024-01-22 (×9): 20 mg via ORAL
  Filled 2024-01-14 (×9): qty 1

## 2024-01-14 MED ORDER — DULOXETINE HCL 60 MG PO CPEP
60.0000 mg | ORAL_CAPSULE | Freq: Every day | ORAL | Status: DC
  Administered 2024-01-14 – 2024-01-23 (×11): 60 mg via ORAL
  Filled 2024-01-14 (×10): qty 1

## 2024-01-14 NOTE — Plan of Care (Signed)

## 2024-01-14 NOTE — TOC Transition Note (Signed)
 Transition of Care [facility]) - Discharge Note   Patient Details  Name: Nicole Santos MRN: [id number] Date of Birth: 1970-11-16  Transition of Care [facility]) CM/SW Contact:  Lucie Lunger, LCSWA Phone Number: 01/14/2024, 12:23 PM  Clinical Narrative:    CSW spoke with pt to review PT recommendation for [facility] PT and MD recommendation for [facility] RN. Pt states that her daughter is a CNA and lives in the home with her. Pt states she does not feel HH PT or RN is needed at this time and requested CSW not refer to agency. TOC signing off.   Final next level of care: Home/Self Care Barriers to Discharge: Barriers Resolved   Patient Goals and CMS Choice Patient states their goals for this hospitalization and ongoing recovery are:: return home CMS Medicare.gov Compare Post Acute Care list provided to:: Patient Choice offered to / list presented to : Patient      Discharge Placement                       Discharge Plan and Services Additional resources added to the After Visit Summary for                            [facility] Arranged: Patient Refused HH          Social Drivers of Health (SDOH) Interventions SDOH Screenings   Food Insecurity: No Food Insecurity (01/06/2024)  Housing: Low Risk  (01/06/2024)  Transportation Needs: No Transportation Needs (01/06/2024)  Utilities: Not At Risk (01/06/2024)  Depression (PHQ2-9): Low Risk  (01/01/2024)  Tobacco Use: Low Risk  (01/07/2024)     Readmission Risk Interventions    11/06/2023    9:51 AM  Readmission Risk Prevention Plan  Transportation Screening Complete  Home Care Screening Complete  Medication Review (RN CM) Complete

## 2024-01-14 NOTE — Progress Notes (Signed)
 [facility]  Called by RN for drainage from midline.  Removed ostomy and ostomy pink and healthy, sloughing mucosa wiped off.  Drainage thin and bloody from umbilicus, no erythema noted  Removed three staples and probed with sterile qtip- thin bloody Liquifed drainage removed, no purulence, packed with gauze   Replaced the ostomy appliance, making an oval opening and moving it out of center to help get the bandage off the staples.    Manuelita Pander, MD [facility] [address] [place], [name] [address] [phone] (office)

## 2024-01-14 NOTE — Progress Notes (Signed)
 Progress Note   Patient: Nicole Santos FMW:[id number] DOB: [birth year] DOA: 01/06/2024     7 DOS: the patient was seen and examined on 01/14/2024   Brief hospital admission narrative course: As per H&P written by Dr. Pearlean on 01/06/2024 Nicole Santos is a 53 y.o. female with medical history significant for ovarian cancer, peritoneal carcinomatosis, pulmonary embolism, hypertension. Patient presented to the ED with complaints of worsening of her chronic abdominal pain that started 3 days ago, with vomiting.  She has been compliant with soft diet as recommended by her providers, but she reports persistent pain with oral intake.   Last hospitalization 11/04/2023 through 11/07/2023 with partial small bowel obstruction-managed conservatively   ED Course: Initial tachycardia heart rate up to 127 now improved. CT AP with contrast-suggest small bowel obstruction. EDP talked to general surgeon Dr. Evonnie, will see in consult, can hold NG tube as patient has not had any vomiting in 2 days.  01/07/2024 status post exploratory laparotomy, lysis of adhesion, small bowel resection,  ileostomy creation the    Subjective :  Postop day #7 Patient was seen and examined-still complaining of poor p.o. intake, Abdominal pain with nausea with oral intake Hemodynamically stable     Assessment and plan  Small bowel obstruction Postop day #7  01/07/2024 status post exploratory laparotomy, lysis of adhesion, small bowel - Hemodynamically stable -Copious liquid discharge per ostomy   Intake/Output Summary (Last 24 hours) at 01/14/2024 1400 Last data filed at 01/14/2024 0720 Gross per 24 hour  Intake 473.08 ml  Output 210 ml  Net 263.08 ml     -Positive residual in ostomy bag -liquid fluid output - D/C NG tube since 01/10/2024 --Improved nausea vomiting-slow minimal but tolerating p.o.  - Per surgery another day to advance her diet - If she can tolerate we will discharge home in a.m.   -Increased input  per ileostomy, Imodium  added by general surgery   -  Patient with underlying history of ovarian cancer with peritoneal carcinomatosis, multiple hospitalization for that reason, SBO, s/p small bowel resection, with ileostomy creation    - Encouraging to increase oral intake, increase ambulation  Acute on chronic anemia with thrombocytopenia  - Exacerbated by postop anemia    Latest Ref Rng & Units 01/14/2024    4:30 AM 01/13/2024    3:54 AM 01/12/2024    3:22 AM  CBC  WBC 4.0 - 10.5 K/uL 8.1  6.0  8.0   Hemoglobin 12.0 - 15.0 g/dL 88.5  88.7  88.5   Hematocrit 36.0 - 46.0 % 34.1  33.4  34.7   Platelets 150 - 400 K/uL 59  53  60    --Hemodynamically stable now - S/p 2U PRBC blood transfusion  01/10/2024 - Monitoring H&H  Chronic thrombocytopenia - Monitoring platelets, stable - No prior bleeding appreciated - Continue to hold Eliquis   H/o Pulmonary embolism (HCC) -Continue holding Eliquis    Hypotensive with a history of essential hypertension  - Poor oral intake on clear liquids-encouraging oral intake - BP remains soft,-otherwise stable-monitoring closely   Hyperlipidemia -continue statin on discharge    Primary peritoneal carcinomatosis (HCC) -With possible new mets -Continue outpatient follow-up with Dr. Katragadda, [facility].  - Palliative care consulted -patient requested full scope of care including full code - Patient wishes to follow-up with Dr. Viktoria   Hypokalemia  -Persistent hypokalemia due to increased ostomy output - Replating orally and with IV fluid - Checking and repleting magnesium  level  class I obesity -Body mass index is 32.22 kg/m.  -Low-calorie diet, portion control discussed with patient.    -----------------------------------------------------------------------------------------------------------------Physical Exam: Blood pressure (!) 91/49, pulse 100, temperature (!) 97.5 F (36.4 C), temperature source Oral,  resp. rate 18, height 5' 2 (1.575 m), weight 79.9 kg, SpO2 97%.   General:  AAO x 3,  cooperative, no distress;   HEENT:  Normocephalic, PERRL, otherwise with in Normal limits   Neuro:  CNII-XII intact. , normal motor and sensation, reflexes intact   Lungs:   Clear to auscultation BL, Respirations unlabored,  No wheezes / crackles  Cardio:    S1/S2, RRR, No murmure, No Rubs or Gallops   Abdomen:  Soft diffuse mild tenderness, abdominal surgical wound with staples, serosanguineous discharge, ileostomy, with ileostomy bag with residuals Soft, non-tender, bowel sounds active all four quadrants, no guarding or peritoneal signs.  Muscular  skeletal:  Limited exam -global generalized weaknesses - in bed, able to move all 4 extremities,   2+ pulses,  symmetric, No pitting edema  Skin:  Dry, warm to touch, negative for any Rashes,  Wounds: Abdominal surgical wound with ileostomy           Data Reviewed:    Latest Ref Rng & Units 01/14/2024    4:30 AM 01/13/2024    3:54 AM 01/12/2024    3:22 AM  CBC  WBC 4.0 - 10.5 K/uL 8.1  6.0  8.0   Hemoglobin 12.0 - 15.0 g/dL 88.5  88.7  88.5   Hematocrit 36.0 - 46.0 % 34.1  33.4  34.7   Platelets 150 - 400 K/uL 59  53  60       Latest Ref Rng & Units 01/14/2024    4:30 AM 01/13/2024    3:54 AM 01/12/2024    3:22 AM  CMP  Glucose 70 - 99 mg/dL 62  886  860   BUN 6 - 20 mg/dL 11  11  17    Creatinine 0.44 - 1.00 mg/dL 9.44  9.42  9.31   Sodium 135 - 145 mmol/L 132  133  135   Potassium 3.5 - 5.1 mmol/L 3.9  3.4  2.7   Chloride 98 - 111 mmol/L 102  96  96   CO2 22 - 32 mmol/L 22  25  28    Calcium  8.9 - 10.3 mg/dL 7.8  7.6  7.6      Family Communication: No family at bedside.  Disposition: Status is: Inpatient The patient will require care spanning > 2 midnights and should be moved to inpatient because: Continue IV therapy, electrolyte repletion and anticipate longer hospitalization while pursuing surgical intervention for  SBO.  Anticipating discharge back home with home health in a.m. if medically stable, cleared by general surgery  (Per surgery to keep the patient 1 more day for encouraging oral intake Pending clearance by surgery to restart Eliquis )  Time spent: 50 minutes  Author: [name], MD 01/14/2024 2:00 PM

## 2024-01-14 NOTE — Progress Notes (Signed)
 Daily Progress Note   Patient Name: Nicole Santos       Date: 01/14/2024 DOB: Jun 21, 1970  Age: 53 y.o. MRN#: [id number] Attending Physician: Willette Adriana LABOR, MD Primary Care Physician: Jolinda Norene HERO, DO Admit Date: 01/06/2024  Reason for Consultation/Follow-up: Establishing goals of care  Subjective:   53 y.o. female  with past medical history of ovarian cancer (11/14/2021), peritoneal carcinomatosis, history of cervical cancer (2000), sleep apnea (not on CPAP), hypertension, depression, anxiety, history of PE, history of diabetes that resolved with weight loss admitted on 01/06/2024 with intractable nausea and vomiting.   Recurrent hospitalizations for SBO (most recent 11/2023). Managed conservatively. Symptoms improved with bowel rest.  She was discharged home.  She was followed by her GYN oncologist and medical oncologist in the interim.  Unfortunately, she had a worsening in her abdominal pain as well as some associated nausea and vomiting and was readmitted on 01/06/2024 she underwent exploratory laparotomy on 01/07/2024.  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  Surgical team unable to release any of her small bowel that was adherent to her pelvis and as such an end ileostomy was placed.  Noted to have numerous nodules throughout her abdomen and significant peritoneal carcinomatosis.  Admitted to the floor for further management and pain control.  Surgery recommends palliative referral for goals of care.   01/08/2024: Goals of care discussion completed.  Patient declines to make any changes to her advance directives at this time.  She is hopeful to hear from her oncology team about treatment options.  She also does not wish to make any changes to her advance directives without first discussing with her entire family.   She will let us  know if she desires family meeting but declined need for 1 at this time.  We adjusted her pain regimen due to end of dose breakthrough pain.   01/09/2024: Symptom management reviewed. Symptoms stable/improving. Not interested in GOC/ACP discussion until able to speak with oncology.   01/10/2024: Followed up for ongoing goals of care and symptom management.  She continued to require IV opioids for pain control.  Nausea and GERD symptoms had improved.  She declined to update advanced directives but would consider outpatient palliative referral.  Will let me know if she decides to proceed with this.  In the interim, patient continues to be followed by general surgery, admitting team, and therapy.  She has refused some inpatient therapy sessions due to fatigue.  She is now having some drainage from surgical site but has been closely being followed by her surgical team.  Lab results reviewed.  Noted to have some mild hyponatremia with sodium level of 132.  Potassium level has normalized.  Renal function remained stable.  White count normal.  Hemoglobin stable in the 11 range.  Vital signs reveal some blood pressure readings in the 90s to 100s.  Tachycardia has improved.  24-hour opioid requirements: Total of 1.5 mg of IV Dilaudid  and 20 mg of oxycodone .  She does use Zofran  as needed.  She was restarted on pantoprazole  for GERD symptoms.   Today, patient states she continues to have some chronic low back pain from laying in the bed.  She denies any pain to her abdomen at this time.  She does report some intermittent abdominal pain after eating.  She also reports some intermittent nausea which she attributes to GERD symptoms.  She also continues to have a poor appetite.  She denies any shortness of breath.  I again attempted to discuss goals of care, advance care planning, and advanced directives.  Discussed again that the nature of her cancer treatment is palliative and what that means.  Provided  information about outpatient palliative care services, including their role in symptom management, emotional support, and enhancing quality of life. Patient's goal is to return home with family.  She states that she wants to follow-up with Dr. Viktoria prior to making any changes to her advance directives.  She is agreeable to a palliative care consult as an outpatient if there is not a significant cost factor (will reach out to [facility] to see if they have any insight on cost).  We discussed ways to improve her p.o. intake to assist with healing.  I also provided her with literature for review in the form of hard choices for loving people.  Offered to have family meeting to discuss prognosis, goals of care, and anticipatory care planning as patient is not sure how she will tell her son about her updated prognosis.  She will consider and let me know if she can coordinate.  Chart review/care coordination:  Completed extensive chart review including EPIC notes, labs (independently reviewed), medication administration record, vital signs. Coordinated care with [facility], bedside nursing staff, and surgeon.   Length of Stay: 7   Physical Exam Constitutional:      General: She is not in acute distress.    Appearance: She is not toxic-appearing.  Pulmonary:     Effort: Pulmonary effort is normal. No respiratory distress.  Skin:    General: Skin is warm and dry.  Neurological:     Mental Status: She is alert.             Vital Signs: BP (!) 91/49 (BP Location: Right Arm)   Pulse 100   Temp (!) 97.5 F (36.4 C) (Oral)   Resp 18   Ht 5' 2 (1.575 m)   Wt 79.9 kg   SpO2 97%   BMI 32.22 kg/m  SpO2: SpO2: 97 % O2 Device: O2 Device: Room Air O2 Flow Rate:        Palliative Assessment/Data: 50-60%   Palliative Care Assessment & Plan   Patient Profile/Assessment:  53 y.o. female with past medical history of ovarian cancer (11/14/2021), peritoneal carcinomatosis, history of cervical cancer (2000),  sleep apnea (not on CPAP), hypertension, depression, anxiety, history of PE, history of diabetes that resolved with weight loss admitted on 01/06/2024 with intractable nausea and vomiting. Diagnosed with recurrent small bowel obstruction (most recently hospitalized 6/25 with the same, managed conservatively). Underwent exploratory laparotomy on 01/07/2024. 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 As such, a palliative end ileostomy was placed.  Extensive discussions have been had regarding goals of care, advance care planning, and advance directives.  Her goal is to return home with her family.  Patient wishes to speak with her GYN oncologist and her entire family before making any decisions regarding changing her advance directives.  She is considering arranging an inpatient family meeting to discuss prognosis and goals of care and agrees to let me know if/when this can be arranged.  She continues to require IV opioids for pain management but we are working on transitioning her to oral opioids.  She had a worsening of her nausea and GERD symptoms yesterday which improved with as needed Zofran  and she has been started on Protonix  orally.  She continues to have a very poor appetite.  Long discussion regarding ways she can help with her appetite was completed today.  Provided information about outpatient palliative care services, including their role in symptom management, emotional support, and enhancing quality of life.  She is agreeable to outpatient palliative referral if it will be low cost.  Will reach out to [facility] to have them placed referral but sure cost is discussed prior to initiating visit.   Recommendations/Plan:  Full code full scope Ongoing goals of care discussion and anticipatory care planning, palliative inpatient team will continue to follow during admission  continue current symptom regimen with attempt to transition to oral opioids alone as we  anticipate discharge home Encouraged patient to continue to work with PT and OT to ensure she is able to manage care at home Outpatient palliative referral placed however, they are to call to discuss any financial obligations prior to initiating visit Recommended she eat small frequent meals and consider following up with registered dietitian as an outpatient.  Also discussed strategies for improving p.o. intake which she is going to work on doing.   Symptom management:  Continue Tylenol  1000 mg p.o. every 6 hours Continue hydromorphone  0.5 mg IV every 3 hours as needed (total 1.5 mg on 24-hour look back) Continue Robaxin  500 mg p.o. every 8 hours Continue Protonix  40 mg daily Continue Zofran  4 mg every 8 hours as needed Continue oxycodone  10 mg every 4 hours as needed (total of 20 mg on 24-hour look back, emphasized the importance of trying to use oral oxycodone  for pain management instead of IV Dilaudid , patient agreeable) Continue Megace  for appetite support  Prognosis: Guarded    Discharge Planning: Home    Detailed review of medical records (labs, imaging, vital signs), medically appropriate exam, discussed with treatment team, counseling and education to patient, family, & staff, documenting clinical information, medication management, coordination of care    Billing based on MDM: High   Problems Addressed: One acute or chronic illness or injury that poses a threat to life or bodily function  Amount and/or Complexity of Data: Category 3:Discussion of management or test interpretation with external physician/other qualified health care professional/appropriate source (not separately reported)  Risks: Parenteral controlled substances         Laymon CHRISTELLA Pinal, NP  Palliative Medicine Team Team phone # [phone]  Thank you for allowing the Palliative Medicine Team to assist in the care of this patient. Please utilize secure chat with additional questions, if there is  no response within 30 minutes please call the above phone number.  Palliative Medicine Team providers are available by phone from 7am to 7pm daily and  can be reached through the team cell phone.  Should this patient require assistance outside of these hours, please call the patient's attending physician.

## 2024-01-14 NOTE — Progress Notes (Signed)
 [facility] Progress Note  7 Days Post-Op  Subjective: Patient seen and examined.  She is resting comfortably in bed.  She was able to tolerate much more of her diet without issues.  She continues to have ostomy output.  My partner, Dr. Kallie removed a couple of staples from her midline incision secondary to drainage at the incision site.  Objective: Vital signs in last 24 hours: Temp:  [97.5 F (36.4 C)-98.5 F (36.9 C)] 97.5 F (36.4 C) (08/12 1231) Pulse Rate:  [91-111] 100 (08/12 1231) Resp:  [18] 18 (08/12 1231) BP: (91-114)/(49-80) 91/49 (08/12 1231) SpO2:  [95 %-98 %] 97 % (08/12 0743) Last BM Date : 01/12/24  Intake/Output from previous day: 08/11 0701 - 08/12 0700 In: 353.1 [I.V.:303.1; IV Piggyback:50] Out: 210 [Stool:210] Intake/Output this shift: Total I/O In: 120 [P.O.:120] Out: -   General appearance: alert, cooperative, and no distress GI: Abdomen soft, nondistended, no percussion tenderness, nontender to palpation; no rigidity, guarding, rebound tenderness; midline incision C/D/I with skin staples in place, right sided ostomy pink and patent with liquid stool in bag, midline incision open with packing in place, serosanguineous drainage noted  Lab Results:  Recent Labs    01/13/24 0354 01/14/24 0430  WBC 6.0 8.1  HGB 11.2* 11.4*  HCT 33.4* 34.1*  PLT 53* 59*   BMET Recent Labs    01/13/24 0354 01/14/24 0430  NA 133* 132*  K 3.4* 3.9  CL 96* 102  CO2 25 22  GLUCOSE 113* 62*  BUN 11 11  CREATININE 0.57 0.55  CALCIUM  7.6* 7.8*   PT/INR No results for input(s): LABPROT, INR in the last 72 hours.  Studies/Results: No results found.  Anti-infectives: Anti-infectives (From admission, onward)    Start     Dose/Rate Route Frequency Ordered Stop   01/08/24 0600  ceFAZolin  (ANCEF ) IVPB 2g/100 mL premix        2 g 200 mL/hr over 30 Minutes Intravenous On call to O.R. 01/07/24 1323 01/07/24 1448   01/07/24 1323  ceFAZolin   (ANCEF ) 2-4 GM/100ML-% IVPB       Note to Pharmacy: Durenda Clotilda HERO: cabinet override      01/07/24 1323 01/07/24 1455       Assessment/Plan:  Patient is a 53 year old female who was admitted with concern for malignant small bowel obstruction.  She is status post exploratory lap, lysis of adhesions, small bowel resection, and creation of a palliative end ileostomy on 8/5.   -Patient's hemoglobin remained stable -Continue GI soft diet.  May advance to regular diet if there is something on this diet that the patient would tolerate eating -Continue oral fiber and imodium   -Will need to monitor amount of ostomy output.  May need to institute antidiarrheal medications pending amount of ostomy output -Midline incision with 3 staples removed secondary to serosanguineous/bloody drainage.  Area packed with gauze.  Will need daily dressing changes -Appreciate palliative care and oncology recommendations -Appreciate hospitalist recommendations -Anticipate discharge in the next 24 to 48 hours   LOS: 7 days    Samhitha Rosen A Latasha Puskas 01/14/2024  Note: Portions of this report may have been transcribed using voice recognition software. Every effort has been made to ensure accuracy; however, inadvertent computerized transcription errors may still be present.

## 2024-01-15 ENCOUNTER — Inpatient Hospital Stay (HOSPITAL_COMMUNITY)

## 2024-01-15 DIAGNOSIS — R52 Pain, unspecified: Secondary | ICD-10-CM

## 2024-01-15 DIAGNOSIS — K56609 Unspecified intestinal obstruction, unspecified as to partial versus complete obstruction: Secondary | ICD-10-CM | POA: Diagnosis not present

## 2024-01-15 DIAGNOSIS — I10 Essential (primary) hypertension: Secondary | ICD-10-CM | POA: Diagnosis not present

## 2024-01-15 DIAGNOSIS — C482 Malignant neoplasm of peritoneum, unspecified: Secondary | ICD-10-CM | POA: Diagnosis not present

## 2024-01-15 DIAGNOSIS — I2782 Chronic pulmonary embolism: Secondary | ICD-10-CM | POA: Diagnosis not present

## 2024-01-15 LAB — BASIC METABOLIC PANEL WITH GFR
Anion gap: 11 (ref 5–15)
BUN: 15 mg/dL (ref 6–20)
CO2: 22 mmol/L (ref 22–32)
Calcium: 8 mg/dL — ABNORMAL LOW (ref 8.9–10.3)
Chloride: 100 mmol/L (ref 98–111)
Creatinine, Ser: 0.73 mg/dL (ref 0.44–1.00)
GFR, Estimated: >60 mL/min (ref >60)
Glucose, Bld: 48 mg/dL — ABNORMAL LOW (ref 70–99)
Potassium: 4.8 mmol/L (ref 3.5–5.1)
Sodium: 133 mmol/L — ABNORMAL LOW (ref 135–145)

## 2024-01-15 LAB — CBC
HCT: 35 % — ABNORMAL LOW (ref 36.0–46.0)
Hemoglobin: 11.5 g/dL — ABNORMAL LOW (ref 12.0–15.0)
MCH: 33.5 pg (ref 26.0–34.0)
MCHC: 32.9 g/dL (ref 30.0–36.0)
MCV: 102 fL — ABNORMAL HIGH (ref 80.0–100.0)
Platelets: 67 K/uL — ABNORMAL LOW (ref 150–400)
RBC: 3.43 MIL/uL — ABNORMAL LOW (ref 3.87–5.11)
RDW: 25.5 % — ABNORMAL HIGH (ref 11.5–15.5)
WBC: 9.5 K/uL (ref 4.0–10.5)
nRBC: 0.3 % — ABNORMAL HIGH (ref 0.0–0.2)

## 2024-01-15 LAB — GLUCOSE, CAPILLARY
Glucose-Capillary: 45 mg/dL — ABNORMAL LOW (ref 70–99)
Glucose-Capillary: 45 mg/dL — ABNORMAL LOW (ref 70–99)
Glucose-Capillary: 174 mg/dL — ABNORMAL HIGH (ref 70–99)
Glucose-Capillary: 82 mg/dL (ref 70–99)
Glucose-Capillary: 52 mg/dL — ABNORMAL LOW (ref 70–99)
Glucose-Capillary: 57 mg/dL — ABNORMAL LOW (ref 70–99)
Glucose-Capillary: 73 mg/dL (ref 70–99)
Glucose-Capillary: 89 mg/dL (ref 70–99)

## 2024-01-15 MED ORDER — IOHEXOL 300 MG/ML  SOLN
100.0000 mL | Freq: Once | INTRAMUSCULAR | Status: AC | PRN
  Administered 2024-01-15 (×2): 100 mL via INTRAVENOUS

## 2024-01-15 MED ORDER — PIPERACILLIN-TAZOBACTAM 3.375 G IVPB
3.3750 g | Freq: Three times a day (TID) | INTRAVENOUS | Status: DC
  Administered 2024-01-15 – 2024-01-22 (×23): 3.375 g via INTRAVENOUS
  Filled 2024-01-15 (×22): qty 50

## 2024-01-15 MED ORDER — HEPARIN (PORCINE) 25000 UT/250ML-% IV SOLN
1100.0000 [IU]/h | INTRAVENOUS | Status: DC
  Administered 2024-01-15 (×2): 1100 [IU]/h via INTRAVENOUS
  Filled 2024-01-15: qty 250

## 2024-01-15 MED ORDER — DEXTROSE 10 % IV SOLN
INTRAVENOUS | Status: DC
  Filled 2024-01-15: qty 1000

## 2024-01-15 MED ORDER — DEXTROSE 50 % IV SOLN
1.0000 | INTRAVENOUS | Status: DC
  Filled 2024-01-15: qty 50

## 2024-01-15 MED ORDER — DEXTROSE 50 % IV SOLN
INTRAVENOUS | Status: AC
  Administered 2024-01-15 (×2): 50 mL
  Filled 2024-01-15: qty 50

## 2024-01-15 NOTE — Progress Notes (Signed)
 Patient has saturated through 3 dressings of saline moist gauze, 3 4x4 dry gauze and ABD pad this shift. Patient now has towel placed over top of dressing since dressing is saturating so quickly.

## 2024-01-15 NOTE — Progress Notes (Signed)
 PROGRESS NOTE   Nicole Santos  FMW:[id number] DOB: 05-27-1971 DOA: 01/06/2024 PCP: Nicole Norene HERO, DO   Chief Complaint  Patient presents with   Abdominal Pain   Level of care: Telemetry  Brief Admission History:  53 y.o. female with medical history significant for ovarian cancer, peritoneal carcinomatosis, pulmonary embolism, hypertension.  Patient presented to the ED with complaints of worsening of her chronic abdominal pain that started 3 days ago, with vomiting.  She has been compliant with soft diet as recommended by her providers, but she reports persistent pain with oral intake.   Last hospitalization 11/04/2023 through 11/07/2023 with partial small bowel obstruction-managed conservatively   ED Course: Initial tachycardia heart rate up to 127 now improved. CT AP with contrast-suggest small bowel obstruction.  EDP talked to general surgeon Nicole Santos, will see in consult, can hold NG tube as patient has not had any vomiting in 2 days.   01/07/2024 status post exploratory laparotomy, lysis of adhesion, small bowel resection,  ileostomy creation the    Assessment and Plan: Small bowel obstruction Recurrent small bowel obstruction- last hospitalization 11/04/2023 through 11/07/2023 with partial small bowel obstruction-managed conservatively.  History of ovarian cancer with peritoneal carcinomatosis. -CTAP WC today-multiple persistent fluid-filled dilated small bowel loops, slight interval worsening.  Distal obstruction with transition point likely over the midline pelvis.  Also findings consistent with carcinomatosis. (See detailed report) - Nicole Santos, consulting - CT abd/pelvis ordered to rule out abscess - holding apixaban  for now, IV heparin   - Morphine  2 mg every 4 hourly as needed - Zofran  as needed  Pulmonary embolism Reports compliance with Eliquis .  Hold Eliquis  while NPO. -Lovenox -DVT prophylaxis dose  Primary peritoneal carcinomatosis  Follows with Nicole Santos, and  Nicole Santos oncology.  S/p 3 cycles of adj platinum-based therapy after surgery, currently on olaparib , her maintenance bevacizumab  is being held per notes.  Essential hypertension Stable.  Not on antihypertensives  Hypoglycemia with hypoglycemic unawareness  -- exacerbated by poor oral intake -- not on any antihyperglycemic agents -- schedule CBG every 4 hours -- encourage oral intake -- concerning that this has been occurring more frequently than we realize  DVT prophylaxis: IV heparin  Code Status:  Family Communication:  Disposition:    Consultants:  surgery  Procedures:   Antimicrobials:  Zosyn  8/13>>   Subjective: Pt reports very poor appetite.    Objective: Vitals:   01/14/24 2247 01/15/24 0321 01/15/24 0653 01/15/24 1420  BP: (!) 104/57 (!) 93/52 114/70 101/62  Pulse: (!) 134 (!) 107 (!) 108 (!) 108  Resp: 20 18 18    Temp: 98.1 F (36.7 C) 97.7 F (36.5 C) 97.6 F (36.4 C) 97.9 F (36.6 C)  TempSrc: Oral Oral Oral Oral  SpO2: 97% 96% 100% 98%  Weight:      Height:        Intake/Output Summary (Last 24 hours) at 01/15/2024 1523 Last data filed at 01/14/2024 2246 Gross per 24 hour  Intake 120 ml  Output 150 ml  Net -30 ml   Filed Weights   01/06/24 1313 01/06/24 1924 01/07/24 1359  Weight: 80.6 kg 79.9 kg 79.9 kg   Examination:  General exam: Appears calm and comfortable  Respiratory system: Clear to auscultation. Respiratory effort normal. Cardiovascular system: normal S1 & S2 heard. No JVD, murmurs, rubs, gallops or clicks. No pedal edema. Gastrointestinal system: Abdomen is mildly distended, soft and tender. Drainage seen from incision.  No organomegaly or masses felt. Normal bowel sounds heard. Central nervous  system: Alert and oriented. No focal neurological deficits. Extremities: Symmetric 5 x 5 power. Skin: No rashes, lesions or ulcers. Psychiatry: Judgement and insight appear normal. Mood & affect appropriate.   Data Reviewed: I have  personally reviewed following labs and imaging studies  CBC: Recent Labs  Lab 01/11/24 0413 01/12/24 0322 01/13/24 0354 01/14/24 0430 01/15/24 0513  WBC 9.6 8.0 6.0 8.1 9.5  HGB 13.7 11.4* 11.2* 11.4* 11.5*  HCT 41.2 34.7* 33.4* 34.1* 35.0*  MCV 100.5* 101.2* 101.5* 100.9* 102.0*  PLT 77* 60* 53* 59* 67*    Basic Metabolic Panel: Recent Labs  Lab 01/09/24 0735 01/12/24 0322 01/13/24 0354 01/14/24 0430 01/15/24 0513  NA 142 135 133* 132* 133*  K 4.0 2.7* 3.4* 3.9 4.8  CL 109 96* 96* 102 100  CO2 23 28 25 22 22   GLUCOSE 119* 139* 113* 62* 48*  BUN 11 17 11 11 15   CREATININE 0.70 0.68 0.57 0.55 0.73  CALCIUM  8.2* 7.6* 7.6* 7.8* 8.0*  MG  --  1.3*  --   --   --     CBG: Recent Labs  Lab 01/15/24 0651 01/15/24 0715 01/15/24 0752 01/15/24 1130  GLUCAP 45* 45* 174* 82    No results found for this or any previous visit (from the past 240 hours).   Radiology Studies: No results found.  Scheduled Meds:  acetaminophen   1,000 mg Oral Q8H   atorvastatin   20 mg Oral QHS   Chlorhexidine  Gluconate Cloth  6 each Topical Daily   dextrose   1 ampule Intravenous STAT   DULoxetine   60 mg Oral Daily   feeding supplement  1 Container Oral TID BM   ferrous sulfate   325 mg Oral TID WC   loperamide   2 mg Oral Daily   megestrol   400 mg Oral Daily   methocarbamol   500 mg Oral TID   pantoprazole   40 mg Oral Daily   sodium chloride  flush  10-40 mL Intracatheter Q12H   Continuous Infusions:  famotidine  (PEPCID ) IV 20 mg (01/15/24 0829)   heparin      piperacillin -tazobactam (ZOSYN )  IV 3.375 g (01/15/24 1032)     LOS: 8 days   Time spent: 54 mins  Nicole Lemieux Vicci, MD How to contact the [facility] Attending or Consulting provider 7A - 7P or covering provider during after hours 7P -7A, for this patient?  Check the care team in [facility] and look for a) attending/consulting [facility] provider listed and b) the [facility] team listed Log into [email or web address] to find provider on call.  Locate the [facility]  provider you are looking for under [facility] and page to a number that you can be directly reached. If you still have difficulty reaching the provider, please page the [facility] (Director on Call) for the Hospitalists listed on amion for assistance.  01/15/2024, 3:23 PM

## 2024-01-15 NOTE — Progress Notes (Addendum)
 Hypoglycemic Event  CBG: 45  Treatment: 8 oz juice/soda  Symptoms: None  Follow-up CBG: Time:0715 CBG Result:45  Possible Reasons for Event: Inadequate meal intake - pt states she did not eat dinner last night due to decreased appetite.  Comments/MD notified:  Patient drank 2 apple juices with sugar added however blood sugar remained the same.   0719- amp of D50 administered. Care handed over to oncoming nurse.  0730- Dr. Vicci notified of above events.   Nicole Santos Backbone

## 2024-01-15 NOTE — Progress Notes (Signed)
 After dextrose  given by 3rd shift RN Deidre , rechecked patients BS was 174. Did not give 2nd ampule of dextrose 

## 2024-01-15 NOTE — Consult Note (Signed)
 WOC Nurse Consult Note:  Arrived for ostomy teaching session, notable odor outside the room that is not ostomy related.  Patient proceeds to tell me that she is soaking wet and that when she got up to the bathroom  it poured out of my belly.   I have assessed her abdomen, surgery removed several staples and she now has copious secretions from this area with foul odor. The drainage is milky but more sanguinous; has an oily characteristics, like ascites.  Reason for Consult: we were not consulted originally, however addressed when I arrived for ostomy care. Wound type: surgical  Pressure Injury POA: NA Measurement: 2cm x 1.5cm x unable to assess depth Wound bed: not visible Drainage (amount, consistency, odor) see above  Periwound: staple line intact proximally and distally  Dressing procedure/placement/frequency: Continue dry packing with gauze until CT scan can be obtained, most likely will need at list QID dressings. Applied by [facility] nurse with bedside nurse today, using ABD pads under her pannus to attempt to keep her dry under the skin fold.    WOC Nurse ostomy follow up Pouch placed 8/11 was not leaking; however the tape border was saturated from the abdominal drainage  Stoma type/location: RLQ, ileostomy Stomal assessment/size: oval shaped, in a crease with dips in her abdominal topography at 3 and 9 o'clock. I am not able to sit her up all the way but appears stoma may disappear in a crease when she is sitting.  Stoma is red, moist, and flush with the skin Peristomal assessment: mild peristomal denudation less than 0.2cm circumferentially  Treatment options for stomal/peristomal skin: 2 barrier ring Output more gelatinous; green; on imodium  BID now Ostomy pouching: 1pc.soft convex with 2 barrier ring. And a belt (today the belt was removed as it was saturated with drainage. New belt in the room to be placed by bedside nursing after CT scan.   Education provided:  Patient self  reports independence with lock and roll closure, however I point out to her that the spout is not currently closed completely. She demonstrates closure. She verbalized steps for closure and cleaning spout with a wick.  She attempts to cut new pouch, we off centered and cut in an oval shape for acute changes to her stoma today.  She needs support with cutting a one pc and is fearful of cutting the bag, the scissors are too small for her fingers as well. We have enrolled in SS and she should receive curved scissors with blunt end. I explained that to the patient today. Her daughter will assist at home. She can not see stoma but does attempt to stretch and place barrier ring.  She can not see stoma to place pouch. The stoma is very active as well so she will need to have things ready and I have advised her to change her pouch in the am when her body is the driest. She verbalized understanding.  She is engaged to learn but today there is a lot going on with this new onset of drainage and the need for a CT scan. We talked about opening the wound completely and the need for dressing changes vs the placement of a drain, however IR is not able to place a drain at [facility]. She may need transfer to another campus for this. Patient is aware, surgery has advised her of the same.   WOC Nurse will follow along with you for continued support with ostomy teaching and care Eathan Groman [facility] MSN, RN, [employer], CNS,  [facility] [phone]    Enrolled patient in [facility] Discharge program: Yes, previously

## 2024-01-15 NOTE — Progress Notes (Signed)
 [facility] Progress Note  8 Days Post-Op  Subjective: Patient seen and examined.  She is resting comfortably in bed.  She did not eat much for dinner last night, as she was still full from her lunch.  She has had increased drainage from her midline incision.  She has started having more formed stool from her ileostomy.  Objective: Vital signs in last 24 hours: Temp:  [97.5 F (36.4 C)-98.1 F (36.7 C)] 97.6 F (36.4 C) (08/13 0653) Pulse Rate:  [100-134] 108 (08/13 0653) Resp:  [18-20] 18 (08/13 0653) BP: (91-114)/(49-70) 114/70 (08/13 0653) SpO2:  [96 %-100 %] 100 % (08/13 0653) Last BM Date : 01/14/24  Intake/Output from previous day: 08/12 0701 - 08/13 0700 In: 290 [P.O.:240; IV Piggyback:50] Out: 150 [Stool:150] Intake/Output this shift: No intake/output data recorded.  General appearance: alert, cooperative, and no distress GI: Abdomen soft, nondistended, no percussion tenderness, nontender to palpation; no rigidity, guarding, rebound tenderness; midline incision C/D/I with skin staples in place, right sided ostomy pink and patent with liquid stool in bag, midline incision open with cloudy brown/purulent drainage, foul odor present  Lab Results:  Recent Labs    01/14/24 0430 01/15/24 0513  WBC 8.1 9.5  HGB 11.4* 11.5*  HCT 34.1* 35.0*  PLT 59* 67*   BMET Recent Labs    01/14/24 0430 01/15/24 0513  NA 132* 133*  K 3.9 4.8  CL 102 100  CO2 22 22  GLUCOSE 62* 48*  BUN 11 15  CREATININE 0.55 0.73  CALCIUM  7.8* 8.0*   PT/INR No results for input(s): LABPROT, INR in the last 72 hours.  Studies/Results: No results found.  Anti-infectives: Anti-infectives (From admission, onward)    Start     Dose/Rate Route Frequency Ordered Stop   01/08/24 0600  ceFAZolin  (ANCEF ) IVPB 2g/100 mL premix        2 g 200 mL/hr over 30 Minutes Intravenous On call to O.R. 01/07/24 1323 01/07/24 1448   01/07/24 1323  ceFAZolin  (ANCEF ) 2-4 GM/100ML-% IVPB        Note to Pharmacy: Durenda Clotilda HERO: cabinet override      01/07/24 1323 01/07/24 1455       Assessment/Plan:  Patient is a 53 year old female who was admitted with concern for malignant small bowel obstruction.  She is status post exploratory lap, lysis of adhesions, small bowel resection, and creation of a palliative end ileostomy on 8/5.   -Given new foul-smelling drainage, I am concerned that she could have some type of intra-abdominal abscess or fluid collection that is draining through her midline incision.  Will obtain a CT of the abdomen and pelvis with IV and oral contrast -IV Zosyn  ordered -No leukocytosis.  Will monitor with daily labs -Patient's hemoglobin remained stable -Continue GI soft diet.  May advance to regular diet if there is something on this diet that the patient would tolerate eating -Continue oral fiber and imodium   -Will need to monitor amount of ostomy output.  May need to institute antidiarrheal medications pending amount of ostomy output -Appreciate palliative care and oncology recommendations -Appreciate hospitalist recommendations -Anticipate discharge in the next 24 to 48 hours   LOS: 8 days    Shanvi Moyd A Dyllin Gulley 01/15/2024  Note: Portions of this report may have been transcribed using voice recognition software. Every effort has been made to ensure accuracy; however, inadvertent computerized transcription errors may still be present.

## 2024-01-15 NOTE — TOC Progression Note (Signed)
 Transition of Care [facility]) - Progression Note    Patient Details  Name: Nicole Santos MRN: [id number] Date of Birth: Aug 31, 1970  [facility] CM/SW Contact  Lucie Lunger, [place] Phone Number: 01/15/2024, 10:34 AM  Clinical Narrative:    Palliative NP consulted for OP palliative referral. CSW sent referral to [facility] with [employer] who states she will follow up with pt. TOC to follow.     Barriers to Discharge: Barriers Resolved               Expected Discharge Plan and Services                                   HH Arranged: Patient Refused [facility]           Social Drivers of Health (SDOH) Interventions SDOH Screenings   Food Insecurity: No Food Insecurity (01/06/2024)  Housing: Low Risk  (01/06/2024)  Transportation Needs: No Transportation Needs (01/06/2024)  Utilities: Not At Risk (01/06/2024)  Depression (PHQ2-9): Low Risk  (01/01/2024)  Tobacco Use: Low Risk  (01/07/2024)    Readmission Risk Interventions    11/06/2023    9:51 AM  Readmission Risk Prevention Plan  Transportation Screening Complete  Home Care Screening Complete  Medication Review (RN CM) Complete

## 2024-01-15 NOTE — Progress Notes (Addendum)
 PHARMACY - ANTICOAGULATION CONSULT NOTE  Pharmacy Consult for heparin  Indication: DVT  Allergies  Allergen Reactions   Augmentin  [Amoxicillin -Pot Clavulanate] Diarrhea and Nausea And Vomiting   Doxycycline Other (See Comments)    Abdominal cramps.   Topamax  [Topiramate ] Nausea And Vomiting    Patient Measurements: Height: 5' 2 (157.5 cm) Weight: 79.9 kg (176 lb 2.4 oz) IBW/kg (Calculated) : 50.1 HEPARIN  DW (KG): 67.8  Vital Signs: Temp: 97.6 F (36.4 C) (08/13 0653) Temp Source: Oral (08/13 0653) BP: 114/70 (08/13 0653) Pulse Rate: 108 (08/13 0653)  Labs: Recent Labs    01/13/24 0354 01/14/24 0430 01/15/24 0513  HGB 11.2* 11.4* 11.5*  HCT 33.4* 34.1* 35.0*  PLT 53* 59* 67*  CREATININE 0.57 0.55 0.73    Estimated Creatinine Clearance: 77.8 mL/min (by C-G formula based on SCr of 0.73 mg/dL).   Medical History: Past Medical History:  Diagnosis Date   Acute pulmonary embolism (HCC) 03/06/2022   Acute respiratory disease due to COVID-19 virus 02/02/2019   Anxiety    Cancer (HCC)    Depression    Dyspnea    Dysrhythmia    Family history of bladder cancer    Family history of breast cancer    History of diabetes mellitus    Resolved in Sept 2023 with weight loss.   Hypertension    Ovarian cancer (HCC) 11/14/2021   Peritoneal carcinomatosis (HCC)    Pneumonia    Sleep apnea    doesn't use CPAP   Tachycardia     Medications:  Medications Prior to Admission  Medication Sig Dispense Refill Last Dose/Taking   atorvastatin  (LIPITOR) 20 MG tablet Take 1 tablet (20 mg total) by mouth at bedtime. 90 tablet 3 01/05/2024 Bedtime   DULoxetine  (CYMBALTA ) 30 MG capsule Take 2 capsules (60 mg total) by mouth daily.   01/05/2024 Morning   ELIQUIS  5 MG TABS tablet Take 5 mg by mouth 2 (two) times daily.   01/05/2024 at 11:30 PM   lidocaine -prilocaine  (EMLA ) cream Apply a quarter sized amount to port a cath site and cover with plastic wrap 1 hour prior to infusion appointments  30 g 3 01/06/2024 Morning   LYNPARZA  150 MG tablet TAKE 2 TABLETS TWICE A DAY. MAY TAKE WITH FOOD TO DECEASE NAUSEA AND VOMITING. (SWALLOW WHOLE) 360 tablet 1 01/05/2024 Bedtime   magnesium  oxide (MAG-OX) 400 (240 Mg) MG tablet TAKE 1 TABLET BY MOUTH TWICE A DAY 180 tablet 1 01/05/2024 Bedtime   potassium chloride  SA (KLOR-CON  M) 20 MEQ tablet Take 1 tablet (20 mEq total) by mouth daily. (Patient taking differently: Take 40 mEq by mouth at bedtime.) 90 tablet 3 01/05/2024 Bedtime    Assessment: 53 year old female who was admitted with concern for malignant small bowel obstruction. She is status post exploratory lap, lysis of adhesions, small bowel resection, and creation of a palliative end ileostomy on 8/5.  Pt has foul smelling drainage from her midline, so likely has an abscess per surgery . Zosyn  started. Wants to transition eliquis  to heparin . Patient with history of PE is reason for anticoagulation. Concerned with low platelets:   85> 77> 59> 67 watch closely , Hgb now stable around 11.5. Last dose of eliquis  01/15/24 at 0820. Will monitor aPTT and HL until correlate since eliquis  effects anti-Xa level.no boluses Will keep aPTT/ HL around low end or range. Platelets:   85> 77> 59> 67 watch closely , if drops below 50K would consider d/c anticoagulation   Goal of Therapy:  Heparin  level 0.3-0.7  units/ml APTT 66-102 Monitor platelets by anticoagulation protocol: Yes   Plan:  Start heparin  infusion at 1100 units/hr at 2100 this evening Check aPTT and anti-Xa level in 6-8 hours and daily while on heparin  Continue to monitor H&H and platelets  Cherlyn Boers, BS Pharm D, BCPS Clinical Pharmacist 01/15/2024,10:17 AM

## 2024-01-15 NOTE — Plan of Care (Signed)

## 2024-01-15 NOTE — Progress Notes (Signed)
 Patients CBG at 1709 wasa 57, given mango icee her CBG is not 73, started on dextrose  10% at 50/ml hour

## 2024-01-15 NOTE — Hospital Course (Addendum)
 53 y.o. female with medical history significant for ovarian cancer, peritoneal carcinomatosis, pulmonary embolism, hypertension.  Patient presented to the ED with complaints of worsening of her chronic abdominal pain that started 3 days ago, with vomiting.  She has been compliant with soft diet as recommended by her providers, but she reports persistent pain with oral intake.   Last hospitalization 11/04/2023 through 11/07/2023 with partial small bowel obstruction-managed conservatively   ED Course: Initial tachycardia heart rate up to 127 now improved. CT AP with contrast-suggest small bowel obstruction.  EDP talked to general surgeon Dr. Evonnie, will see in consult, can hold NG tube as patient has not had any vomiting in 2 days.   01/07/2024 status post exploratory laparotomy, lysis of adhesion, small bowel resection,  ileostomy creation   Her postoperative course was characterized by slow improvement.  General surgery continue to follow for postoperative care.  Repeat CT of the abdomen pelvis on 01/15/2024 showed small bowel loops leading to the ostomy with some wall thickening and inflammation.  The small bowel proximal to this was dilated concerning for SBO.  There was new enhancing peritoneal fluid collections in the paracolic gutters.  The patient was started on Zosyn . She remained afebrile and hemodynamically stable.  She continued to have drainage from her midline wound.  Her wound VAC was placed and managed by general surgery.  She continued to have decreased oral intake.  She was started on Megace  with minimal improvement.  Her diet was advanced.  Although her oral intake was minimal, she did not have any emesis.  She did have output from her ostomy suggesting she was not clinically obstructed.  She had intermittent hypoglycemia for which she required D10 drip.  Her CBGs gradually improved as her oral intake somewhat improved.  Palliative medicine continue to follow the patient.  Extensive goals of  care discussions were held with the patient and family.  The patient wished to remain full scope of care.  However, she wanted to go home.  She did not want to go to LTAC.  Her family agreed to provide supportive care for home.  Home hospice was consulted and agreed to accept the patient for management after discharge.  At the time of discharge, the patient was afebrile and hemodynamically stable without any emesis.  She was able to tolerate small amounts of oral intake.  Her BMP and CBC remained stable.  DME and wound VAC were arranged for discharge.  After discussion with the surgical team, palliative medicine, the patient and her family, it was felt that going home with hospice following would be the best path forward regarding her continued care.  She was at a point where she was medically stable for transport home although there is high risk of progression of her cancer and further complications.  With this known fact, it was felt that going home with her family was the best situation.

## 2024-01-15 NOTE — Progress Notes (Signed)
 PT Cancellation Note  Patient Details Name: Nicole Santos MRN: [id number] DOB: 1970/09/06   Cancelled Treatment:    Reason Eval/Treat Not Completed: Patient declined, no reason specified. Patient frequently declining to attempt ambulation with PT and Mobility Tech due to stomach wound, but states she has been walking to bathroom with nursing supervising  and confirmed by RN. Patient discharged to care of nursing for ambulation as tolerated for length of stay.   3:10 PM, 01/15/24 Lynwood Music, MPT Physical Therapist with [facility] 336 [phone] office [phone] mobile phone

## 2024-01-16 DIAGNOSIS — I10 Essential (primary) hypertension: Secondary | ICD-10-CM | POA: Diagnosis not present

## 2024-01-16 DIAGNOSIS — I2782 Chronic pulmonary embolism: Secondary | ICD-10-CM | POA: Diagnosis not present

## 2024-01-16 DIAGNOSIS — C482 Malignant neoplasm of peritoneum, unspecified: Secondary | ICD-10-CM | POA: Diagnosis not present

## 2024-01-16 DIAGNOSIS — K56609 Unspecified intestinal obstruction, unspecified as to partial versus complete obstruction: Secondary | ICD-10-CM | POA: Diagnosis not present

## 2024-01-16 LAB — BASIC METABOLIC PANEL WITH GFR
Anion gap: 13 (ref 5–15)
BUN: 16 mg/dL (ref 6–20)
CO2: 21 mmol/L — ABNORMAL LOW (ref 22–32)
Calcium: 7.8 mg/dL — ABNORMAL LOW (ref 8.9–10.3)
Chloride: 97 mmol/L — ABNORMAL LOW (ref 98–111)
Creatinine, Ser: 0.78 mg/dL (ref 0.44–1.00)
GFR, Estimated: >60 mL/min (ref >60)
Glucose, Bld: 121 mg/dL — ABNORMAL HIGH (ref 70–99)
Potassium: 3.3 mmol/L — ABNORMAL LOW (ref 3.5–5.1)
Sodium: 131 mmol/L — ABNORMAL LOW (ref 135–145)

## 2024-01-16 LAB — GLUCOSE, CAPILLARY
Glucose-Capillary: 113 mg/dL — ABNORMAL HIGH (ref 70–99)
Glucose-Capillary: 117 mg/dL — ABNORMAL HIGH (ref 70–99)
Glucose-Capillary: 123 mg/dL — ABNORMAL HIGH (ref 70–99)
Glucose-Capillary: 126 mg/dL — ABNORMAL HIGH (ref 70–99)
Glucose-Capillary: 127 mg/dL — ABNORMAL HIGH (ref 70–99)
Glucose-Capillary: 138 mg/dL — ABNORMAL HIGH (ref 70–99)

## 2024-01-16 LAB — CBC
HCT: 35.9 % — ABNORMAL LOW (ref 36.0–46.0)
Hemoglobin: 11.8 g/dL — ABNORMAL LOW (ref 12.0–15.0)
MCH: 33.6 pg (ref 26.0–34.0)
MCHC: 32.9 g/dL (ref 30.0–36.0)
MCV: 102.3 fL — ABNORMAL HIGH (ref 80.0–100.0)
Platelets: 59 K/uL — ABNORMAL LOW (ref 150–400)
RBC: 3.51 MIL/uL — ABNORMAL LOW (ref 3.87–5.11)
RDW: 25.8 % — ABNORMAL HIGH (ref 11.5–15.5)
WBC: 11.1 K/uL — ABNORMAL HIGH (ref 4.0–10.5)
nRBC: 0.4 % — ABNORMAL HIGH (ref 0.0–0.2)

## 2024-01-16 LAB — HEPARIN LEVEL (UNFRACTIONATED): Heparin Unfractionated: >1.1 [IU]/mL — ABNORMAL HIGH (ref 0.30–0.70)

## 2024-01-16 LAB — APTT
aPTT: 166 s (ref 24–36)
aPTT: 166 s (ref 24–36)

## 2024-01-16 MED ORDER — HEPARIN (PORCINE) 25000 UT/250ML-% IV SOLN
700.0000 [IU]/h | INTRAVENOUS | Status: DC
  Administered 2024-01-16: 700 [IU]/h via INTRAVENOUS

## 2024-01-16 MED ORDER — HEPARIN (PORCINE) 25000 UT/250ML-% IV SOLN
900.0000 [IU]/h | INTRAVENOUS | Status: DC
  Administered 2024-01-16: 900 [IU]/h via INTRAVENOUS

## 2024-01-16 MED ORDER — DEXTROSE 10 % IV SOLN
INTRAVENOUS | Status: DC
  Filled 2024-01-16: qty 1000

## 2024-01-16 NOTE — Progress Notes (Addendum)
 PROGRESS NOTE   Nicole Santos  FMW:[id number] DOB: 13-Jul-1970 DOA: 01/06/2024 PCP: Jolinda Norene HERO, DO   Chief Complaint  Patient presents with   Abdominal Pain   Level of care: Telemetry  Brief Admission History:  53 y.o. female with medical history significant for ovarian cancer, peritoneal carcinomatosis, pulmonary embolism, hypertension.  Patient presented to the ED with complaints of worsening of her chronic abdominal pain that started 3 days ago, with vomiting.  She has been compliant with soft diet as recommended by her providers, but she reports persistent pain with oral intake.   Last hospitalization 11/04/2023 through 11/07/2023 with partial small bowel obstruction-managed conservatively   ED Course: Initial tachycardia heart rate up to 127 now improved. CT AP with contrast-suggest small bowel obstruction.  EDP talked to general surgeon Dr. Evonnie, will see in consult, can hold NG tube as patient has not had any vomiting in 2 days.   01/07/2024 status post exploratory laparotomy, lysis of adhesion, small bowel resection,  ileostomy creation the    Assessment and Plan:  Small bowel obstruction Recurrent small bowel obstruction- last hospitalization 11/04/2023 through 11/07/2023 with partial small bowel obstruction-managed conservatively.  History of ovarian cancer with peritoneal carcinomatosis. -CTAP WC today-multiple persistent fluid-filled dilated small bowel loops, slight interval worsening.  Distal obstruction with transition point likely over the midline pelvis.  Also findings consistent with carcinomatosis. (See detailed report) - Dr. Evonnie, consulting - CT abd/pelvis ordered to rule out abscess -- findings concerning for abscess vs ascites - holding apixaban  for now, IV heparin   - Morphine  2 mg every 4 hourly as needed - Zofran  as needed - IR consulted regarding drain -- feels not enough collection for drain placement, US  paracentesis ordered for evaluation of fluid to  rule out infection, etc.  -- now having diarrhea, added antidiarrheals as needed for symptoms   Pulmonary embolism Reports compliance with Eliquis .  Hold Eliquis  temporarily. -currently on IV heparin  infusion per surgeon  Primary peritoneal carcinomatosis  [facility]ragadda[facility]  S/p 3 cycles of adj platinum-based therapy after surgery, currently on olaparib , her maintenance bevacizumab  is being held per notes.  Essential hypertension Stable.  Not on antihypertensives  Hypoglycemia with hypoglycemic unawareness  -- exacerbated by poor oral intake -- not on any antihyperglycemic agents -- schedule CBG every 4 hours -- encourage oral intake -- concerning that this has been occurring more frequently than we realize -- pt still not eating/drinking well and now remains dependent on continuous D10 infusion to avoid recurrent hypoglycemia   DVT prophylaxis: IV heparin  Code Status:  Family Communication:  Disposition:    Consultants:  surgery  Procedures:   Antimicrobials:  Zosyn  8/13>>   Subjective: Still with very poor oral intake.      Objective: Vitals:   01/16/24 0337 01/16/24 0612 01/16/24 0807 01/16/24 1407  BP: 108/71 103/71 104/76 110/77  Pulse: (!) 114 (!) 126 (!) 125 (!) 111  Resp: 18 18  20   Temp: 97.8 F (36.6 C) 97.6 F (36.4 C) 97.8 F (36.6 C) (!) 97.4 F (36.3 C)  TempSrc: Oral Oral  Oral  SpO2: 98% 98% 98% 100%  Weight:      Height:        Intake/Output Summary (Last 24 hours) at 01/16/2024 1453 Last data filed at 01/16/2024 1300 Gross per 24 hour  Intake 1394.86 ml  Output 200 ml  Net 1194.86 ml   Filed Weights   01/06/24 1313 01/06/24 1924 01/07/24 1359  Weight: 80.6 kg 79.9 kg 79.9 kg   Examination:  General exam: Appears calm and comfortable  Respiratory system: Clear to auscultation. Respiratory effort normal. Cardiovascular system: normal S1 & S2 heard. No JVD, murmurs, rubs, gallops or clicks. No  pedal edema. Gastrointestinal system: Abdomen is mildly distended, soft and tender. Drainage seen from incision.  No organomegaly or masses felt. Normal bowel sounds heard. Central nervous system: Alert and oriented. No focal neurological deficits. Extremities: Symmetric 5 x 5 power. Skin: No rashes, lesions or ulcers. Psychiatry: Judgement and insight appear normal. Mood & affect appropriate.   Data Reviewed: I have personally reviewed following labs and imaging studies  CBC: Recent Labs  Lab 01/12/24 0322 01/13/24 0354 01/14/24 0430 01/15/24 0513 01/16/24 0504  WBC 8.0 6.0 8.1 9.5 11.1*  HGB 11.4* 11.2* 11.4* 11.5* 11.8*  HCT 34.7* 33.4* 34.1* 35.0* 35.9*  MCV 101.2* 101.5* 100.9* 102.0* 102.3*  PLT 60* 53* 59* 67* 59*    Basic Metabolic Panel: Recent Labs  Lab 01/12/24 0322 01/13/24 0354 01/14/24 0430 01/15/24 0513 01/16/24 0504  NA 135 133* 132* 133* 131*  K 2.7* 3.4* 3.9 4.8 3.3*  CL 96* 96* 102 100 97*  CO2 28 25 22 22  21*  GLUCOSE 139* 113* 62* 48* 121*  BUN 17 11 11 15 16   CREATININE 0.68 0.57 0.55 0.73 0.78  CALCIUM  7.6* 7.6* 7.8* 8.0* 7.8*  MG 1.3*  --   --   --   --     CBG: Recent Labs  Lab 01/15/24 1955 01/16/24 0031 01/16/24 0338 01/16/24 0742 01/16/24 1145  GLUCAP 89 113* 123* 126* 117*    No results found for this or any previous visit (from the past 240 hours).   Radiology Studies: CT ABDOMEN PELVIS W CONTRAST Result Date: 01/15/2024 CLINICAL DATA:  Evaluate for abscess.  History of ovarian cancer. EXAM: CT ABDOMEN AND PELVIS WITH CONTRAST TECHNIQUE: Multidetector CT imaging of the abdomen and pelvis was performed using the standard protocol following bolus administration of intravenous contrast. RADIATION DOSE REDUCTION: This exam was performed according to the departmental dose-optimization program which includes automated exposure control, adjustment of the mA and/or kV according to patient size and/or use of iterative reconstruction  technique. CONTRAST:  OMNIPAQUE  IOHEXOL  300 MG/ML  SOLN COMPARISON:  CT abdomen and pelvis 01/06/2024 FINDINGS: Lower chest: There is a new trace left pleural effusion. Hepatobiliary: Gallstones are again seen. No biliary ductal dilatation. There is new subcapsular enhancing fluid collection measuring 11 mm along the inferior left margin of the liver. Small foci of air seen within this collection. Pancreas: Unremarkable. No pancreatic ductal dilatation or surrounding inflammatory changes. Spleen: Normal in size without focal abnormality. Adrenals/Urinary Tract: There is a 2.5 cm cyst in the left kidney. Otherwise, the kidneys and adrenal glands are within normal limits. There is diffuse bladder wall thickening, particularly along the dome of the bladder with surrounding inflammation. There is a small amount of air in the bladder which is new. Stomach/Bowel: There is a new right-sided ostomy present. Bowel loops leading to the ostomy demonstrates diffuse wall thickening with surrounding inflammation over moderate length segment. Small bowel proximal to this level is dilated with air-fluid levels measuring up to 3.8 cm. The stomach is moderately distended with fluid. There is gastric antral wall thickening. Visualized colonic loops appear decompressed. The appendix is not seen. No pneumatosis. Vascular/Lymphatic: Aorta and IVC are normal in in size. There are atherosclerotic calcifications of the aorta. Vascular stent is seen in the  left common iliac artery. No discrete enlarged lymph nodes are identified. Reproductive: Uterus appears small and unchanged. Adnexa are unremarkable. Other: There is a new small amount of free air in the anterior abdomen which may be related to recent surgery. Enhancing peritoneal fluid collections are seen in the bilateral pericolic gutters, new from prior. Left-sided collection measures 3.6 x 5.8 by 10 cm. Right-sided collection measures 4.7 by 2.1 by 8.6 cm. Small areas of  enhancing peritoneal fluid are noted in the lower anterior pelvis. There is some tethering of small bowel loops in the anterior pelvis similar to prior. There is new midline abdominal wall skin staple line and subcutaneous edema. Medication injection site noted in the left anterior abdomen. Musculoskeletal: No acute fracture. IMPRESSION: 1. New right-sided ostomy. Bowel loops leading to the ostomy demonstrates diffuse wall thickening with surrounding inflammation worrisome for nonspecific enteritis. Small bowel proximal to this level is dilated with air-fluid levels worrisome for small bowel obstruction. 2. New enhancing peritoneal fluid collections in the bilateral pericolic gutters and lower anterior pelvis worrisome for abscesses. 3. New subcapsular enhancing fluid collection along the inferior left margin of the liver worrisome for abscess. 4. New small amount of free air in the anterior abdomen may be related to recent surgery. 5. New trace left pleural effusion. 6. Diffuse bladder wall thickening with surrounding inflammation worrisome for cystitis. There is a small amount of air in the bladder which may be related to recent instrumentation. 7. Gastric antral wall thickening worrisome for gastritis. 8. Cholelithiasis. 9. Aortic atherosclerosis. Aortic Atherosclerosis (ICD10-I70.0). Electronically Signed   By: Greig Pique M.D.   On: 01/15/2024 17:36    Scheduled Meds:  acetaminophen   1,000 mg Oral Q8H   atorvastatin   20 mg Oral QHS   Chlorhexidine  Gluconate Cloth  6 each Topical Daily   DULoxetine   60 mg Oral Daily   feeding supplement  1 Container Oral TID BM   ferrous sulfate   325 mg Oral TID WC   loperamide   2 mg Oral Daily   megestrol   400 mg Oral Daily   methocarbamol   500 mg Oral TID   pantoprazole   40 mg Oral Daily   sodium chloride  flush  10-40 mL Intracatheter Q12H   Continuous Infusions:  dextrose      famotidine  (PEPCID ) IV 20 mg (01/16/24 0921)   heparin  900 Units/hr (01/16/24  0804)   piperacillin -tazobactam (ZOSYN )  IV 3.375 g (01/16/24 0923)    LOS: 9 days   Time spent: 54 mins  Evamarie Raetz Vicci, MD How to contact the [facility] Attending or Consulting provider 7A - 7P or covering provider during after hours 7P -7A, for this patient?  Check the care team in [facility] and look for a) attending/consulting [facility] provider listed and b) the [facility] team listed Log into [email or web address] to find provider on call.  Locate the [facility] provider you are looking for under [facility] and page to a number that you can be directly reached. If you still have difficulty reaching the provider, please page the [facility] (Director on Call) for the Hospitalists listed on amion for assistance.  01/16/2024, 2:53 PM

## 2024-01-16 NOTE — Progress Notes (Signed)
 [facility] Progress Note  9 Days Post-Op  Subjective: Patient seen and examined.  She is resting comfortably in bed.  She is a little bit more sleepy this morning.  She ate some of her lunch yesterday, but denies eating any breakfast or dinner today.  She complains of pain across her lower abdomen.  Ostomy has dark liquid to formed green stool in bag.  Objective: Vital signs in last 24 hours: Temp:  [97.6 F (36.4 C)-97.9 F (36.6 C)] 97.8 F (36.6 C) (08/14 0807) Pulse Rate:  [108-126] 125 (08/14 0807) Resp:  [18] 18 (08/14 0612) BP: (101-108)/(62-78) 104/76 (08/14 0807) SpO2:  [97 %-98 %] 98 % (08/14 0807) Last BM Date : 01/15/24  Intake/Output from previous day: 08/13 0701 - 08/14 0700 In: 1494.9 [P.O.:580; I.V.:614.8; IV Piggyback:300.1] Out: -  Intake/Output this shift: Total I/O In: -  Out: 200 [Urine:200]  General appearance: alert, cooperative, and no distress GI: Abdomen soft, nondistended, no percussion tenderness, nontender to palpation; no rigidity, guarding, rebound tenderness; midline incision with skin staples in place, midline incision open with cloudy purulent appearing drainage, right sided ostomy pink and patent with liquid stool in bag  Lab Results:  Recent Labs    01/15/24 0513 01/16/24 0504  WBC 9.5 11.1*  HGB 11.5* 11.8*  HCT 35.0* 35.9*  PLT 67* 59*   BMET Recent Labs    01/15/24 0513 01/16/24 0504  NA 133* 131*  K 4.8 3.3*  CL 100 97*  CO2 22 21*  GLUCOSE 48* 121*  BUN 15 16  CREATININE 0.73 0.78  CALCIUM  8.0* 7.8*   PT/INR No results for input(s): LABPROT, INR in the last 72 hours.  Studies/Results: CT ABDOMEN PELVIS W CONTRAST Result Date: 01/15/2024 CLINICAL DATA:  Evaluate for abscess.  History of ovarian cancer. EXAM: CT ABDOMEN AND PELVIS WITH CONTRAST TECHNIQUE: Multidetector CT imaging of the abdomen and pelvis was performed using the standard protocol following bolus administration of intravenous  contrast. RADIATION DOSE REDUCTION: This exam was performed according to the departmental dose-optimization program which includes automated exposure control, adjustment of the mA and/or kV according to patient size and/or use of iterative reconstruction technique. CONTRAST:  OMNIPAQUE  IOHEXOL  300 MG/ML  SOLN COMPARISON:  CT abdomen and pelvis 01/06/2024 FINDINGS: Lower chest: There is a new trace left pleural effusion. Hepatobiliary: Gallstones are again seen. No biliary ductal dilatation. There is new subcapsular enhancing fluid collection measuring 11 mm along the inferior left margin of the liver. Small foci of air seen within this collection. Pancreas: Unremarkable. No pancreatic ductal dilatation or surrounding inflammatory changes. Spleen: Normal in size without focal abnormality. Adrenals/Urinary Tract: There is a 2.5 cm cyst in the left kidney. Otherwise, the kidneys and adrenal glands are within normal limits. There is diffuse bladder wall thickening, particularly along the dome of the bladder with surrounding inflammation. There is a small amount of air in the bladder which is new. Stomach/Bowel: There is a new right-sided ostomy present. Bowel loops leading to the ostomy demonstrates diffuse wall thickening with surrounding inflammation over moderate length segment. Small bowel proximal to this level is dilated with air-fluid levels measuring up to 3.8 cm. The stomach is moderately distended with fluid. There is gastric antral wall thickening. Visualized colonic loops appear decompressed. The appendix is not seen. No pneumatosis. Vascular/Lymphatic: Aorta and IVC are normal in in size. There are atherosclerotic calcifications of the aorta. Vascular stent is seen in the left common iliac artery. No discrete enlarged lymph nodes  are identified. Reproductive: Uterus appears small and unchanged. Adnexa are unremarkable. Other: There is a new small amount of free air in the anterior abdomen which may be  related to recent surgery. Enhancing peritoneal fluid collections are seen in the bilateral pericolic gutters, new from prior. Left-sided collection measures 3.6 x 5.8 by 10 cm. Right-sided collection measures 4.7 by 2.1 by 8.6 cm. Small areas of enhancing peritoneal fluid are noted in the lower anterior pelvis. There is some tethering of small bowel loops in the anterior pelvis similar to prior. There is new midline abdominal wall skin staple line and subcutaneous edema. Medication injection site noted in the left anterior abdomen. Musculoskeletal: No acute fracture. IMPRESSION: 1. New right-sided ostomy. Bowel loops leading to the ostomy demonstrates diffuse wall thickening with surrounding inflammation worrisome for nonspecific enteritis. Small bowel proximal to this level is dilated with air-fluid levels worrisome for small bowel obstruction. 2. New enhancing peritoneal fluid collections in the bilateral pericolic gutters and lower anterior pelvis worrisome for abscesses. 3. New subcapsular enhancing fluid collection along the inferior left margin of the liver worrisome for abscess. 4. New small amount of free air in the anterior abdomen may be related to recent surgery. 5. New trace left pleural effusion. 6. Diffuse bladder wall thickening with surrounding inflammation worrisome for cystitis. There is a small amount of air in the bladder which may be related to recent instrumentation. 7. Gastric antral wall thickening worrisome for gastritis. 8. Cholelithiasis. 9. Aortic atherosclerosis. Aortic Atherosclerosis (ICD10-I70.0). Electronically Signed   By: Greig Pique M.D.   On: 01/15/2024 17:36    Anti-infectives: Anti-infectives (From admission, onward)    Start     Dose/Rate Route Frequency Ordered Stop   01/15/24 1000  piperacillin -tazobactam (ZOSYN ) IVPB 3.375 g        3.375 g 12.5 mL/hr over 240 Minutes Intravenous Every 8 hours 01/15/24 0920     01/08/24 0600  ceFAZolin  (ANCEF ) IVPB 2g/100 mL  premix        2 g 200 mL/hr over 30 Minutes Intravenous On call to O.R. 01/07/24 1323 01/07/24 1448   01/07/24 1323  ceFAZolin  (ANCEF ) 2-4 GM/100ML-% IVPB       Note to Pharmacy: Durenda Clotilda HERO: cabinet override      01/07/24 1323 01/07/24 1455       Assessment/Plan:  Patient is a 53 year old female who was admitted with concern for malignant small bowel obstruction.  She is status post exploratory lap, lysis of adhesions, small bowel resection, and creation of a palliative end ileostomy on 8/5.   -CT of the abdomen and pelvis yesterday demonstrated new enhancing peritoneal fluid collections in the bilateral paracolic gutters in the lower anterior pelvis worrisome for abscesses, possible subcapsular enhancing fluid collection by the liver worrisome for abscess, small amount of free air, likely related to recent surgery, and possible concern for enteritis, cystitis, and gastritis. -After discussions with IR, the fluid is not organized enough for drain placement.  However, given the significant amount of drainage through her midline incision, we will request paracentesis to decrease the amount of fluid within her abdomen -I suspect the findings of enteritis, cystitis, and gastritis are reactive findings secondary to surrounding possibly infected ascitic fluid -IV Zosyn   - Leukocytosis today of 11.1.  Will continue to monitor with daily labs -Patient's hemoglobin remained stable -Continue GI soft diet -Will need to monitor amount of ostomy output.  May need to institute antidiarrheal medications pending amount of ostomy output -Appreciate palliative care and  oncology recommendations -Appreciate hospitalist recommendations   LOS: 9 days    Zari Cly A Kuron Docken 01/16/2024  Note: Portions of this report may have been transcribed using voice recognition software. Every effort has been made to ensure accuracy; however, inadvertent computerized transcription errors may still be present.

## 2024-01-16 NOTE — Progress Notes (Signed)
 IR asked to evaluate patient for intra-abdominal fluid collection aspiration/drain placements. Imaging reviewed by Dr. Jennefer. The fluid collections are not organized and appears more likely to be post-op ascites. Drain placement not recommended at this time. Dr. Evonnie requests paracentesis and an order has been placed. Purulent fluid is leaking through the patient's abdominal wounds and there is concern for poor wound healing. Paracentesis to be attempted 01/17/24.  Warren Dais, AGACNP-BC 01/16/2024, 10:05 AM

## 2024-01-16 NOTE — Progress Notes (Signed)
 PHARMACY - ANTICOAGULATION CONSULT NOTE  Pharmacy Consult for heparin  Indication: DVT  Patient Measurements: Height: 5' 2 (157.5 cm) Weight: 79.9 kg (176 lb 2.4 oz) IBW/kg (Calculated) : 50.1 HEPARIN  DW (KG): 67.8  Labs: Recent Labs    01/14/24 0430 01/15/24 0513 01/16/24 0504 01/16/24 1554  HGB 11.4* 11.5* 11.8*  --   HCT 34.1* 35.0* 35.9*  --   PLT 59* 67* 59*  --   APTT  --   --  166* 166*  HEPARINUNFRC  --   --  >1.10*  --   CREATININE 0.55 0.73 0.78  --     Estimated Creatinine Clearance: 77.8 mL/min (by C-G formula based on SCr of 0.78 mg/dL).  Assessment: 53 year old female who was admitted with concern for malignant small bowel obstruction. She is status post exploratory lap, lysis of adhesions, small bowel resection, and creation of a palliative end ileostomy on 8/5.  Pt has foul smelling drainage from her midline, so likely has an abscess per surgery . Zosyn  started. Wants to transition eliquis  to heparin . Patient with history of PE is reason for anticoagulation. Concerned with low platelets:   85> 77> 59> 67 watch closely , Hgb now stable around 11.5. Last dose of eliquis  01/15/24 at 0820. Will monitor aPTT and HL until correlate since eliquis  effects anti-Xa level.no boluses Will keep aPTT/ HL around low end or range. Platelets:   85> 77> 59> 67 watch closely , if drops below 50K would consider d/c anticoagulation  AM: heparin  level falsely elevated and aPTT high on 1100 units/hr. Per RN, drawn appropriately with no s/sx of bleeding. Plts 67 > 59, Hgb low stable  PM: aPTT elevated again on 900 un/hr and surprisingly un-changed after decrease from 1100 un/hr  Goal of Therapy:  Heparin  level 0.3-0.7 units/ml APTT 66-102 Monitor platelets by anticoagulation protocol: Yes   Plan:  Hold heparin  for 1 hour Decrease heparin  infusion at 700 units/hr  Check aPTT 6 hours and daily while on heparin  Monitor with aPTTs until correlates with heparin  level Continue to  monitor H&H and platelets  Nicole Santos 01/16/2024 6:00 PM

## 2024-01-16 NOTE — Plan of Care (Signed)

## 2024-01-16 NOTE — Consult Note (Signed)
 WOC follow up after visit yesterday. For paracentesis 8/15; no plans for transfer.  Ordered ostomy supplies. May benefit from another teaching visit for ostomy pouch change,  however will not have staff to meet this need until early next week. Patient has very good skill set for emptying and cleaning spout, and knowledge of pouch change. Her daughter is a Lawyer and lives with her will be able to assist with pouch change as the patient has difficulty seeing stoma due to body habitus and abdominal topography.  If to be DC over the weekend would be good to have [facility] if possible for a few visits. Referral to outpatient ostomy clinic would be desired as well.   Prestyn Stanco [facility], CNS, CWON-AP [phone]

## 2024-01-16 NOTE — Progress Notes (Signed)
 PHARMACY - ANTICOAGULATION CONSULT NOTE  Pharmacy Consult for heparin  Indication: DVT  Allergies  Allergen Reactions   Augmentin  [Amoxicillin -Pot Clavulanate] Diarrhea and Nausea And Vomiting   Doxycycline Other (See Comments)    Abdominal cramps.   Topamax  [Topiramate ] Nausea And Vomiting    Patient Measurements: Height: 5' 2 (157.5 cm) Weight: 79.9 kg (176 lb 2.4 oz) IBW/kg (Calculated) : 50.1 HEPARIN  DW (KG): 67.8  Vital Signs: Temp: 97.6 F (36.4 C) (08/14 0612) Temp Source: Oral (08/14 0612) BP: 103/71 (08/14 0612) Pulse Rate: 126 (08/14 0612)  Labs: Recent Labs    01/14/24 0430 01/15/24 0513 01/16/24 0504  HGB 11.4* 11.5* 11.8*  HCT 34.1* 35.0* 35.9*  PLT 59* 67* 59*  APTT  --   --  166*  HEPARINUNFRC  --   --  >1.10*  CREATININE 0.55 0.73 0.78    Estimated Creatinine Clearance: 77.8 mL/min (by C-G formula based on SCr of 0.78 mg/dL).  Assessment: 53 year old female who was admitted with concern for malignant small bowel obstruction. She is status post exploratory lap, lysis of adhesions, small bowel resection, and creation of a palliative end ileostomy on 8/5.  Pt has foul smelling drainage from her midline, so likely has an abscess per surgery . Zosyn  started. Wants to transition eliquis  to heparin . Patient with history of PE is reason for anticoagulation. Concerned with low platelets:   85> 77> 59> 67 watch closely , Hgb now stable around 11.5. Last dose of eliquis  01/15/24 at 0820. Will monitor aPTT and HL until correlate since eliquis  effects anti-Xa level.no boluses Will keep aPTT/ HL around low end or range. Platelets:   85> 77> 59> 67 watch closely , if drops below 50K would consider d/c anticoagulation  AM: heparin  level falsely elevated and aPTT high on 1100 units/hr. Per RN, drawn appropriately with no s/sx of bleeding. Plts 67 > 59, Hgb low stable  Goal of Therapy:  Heparin  level 0.3-0.7 units/ml APTT 66-102 Monitor platelets by anticoagulation  protocol: Yes   Plan:  Hold heparin  for 1 hour Decrease heparin  infusion at 900 units/hr  Check aPTT 6 hours and daily while on heparin  Monitor with aPTTs until correlates with heparin  level Continue to monitor H&H and platelets  Lynwood Poplar, PharmD, BCPS Clinical Pharmacist 01/16/2024 6:41 AM

## 2024-01-16 NOTE — Progress Notes (Signed)
 Lab called with a critical PTT of 166, informed provider via secure chat. Received new orders to stop the Heparin  and to contact the on call pharm. Called [facility] pharm and was instructed to hold for one hour and would receive more orders to follow. Will inform oncoming shift. CN aware.

## 2024-01-17 ENCOUNTER — Inpatient Hospital Stay (HOSPITAL_COMMUNITY)

## 2024-01-17 ENCOUNTER — Other Ambulatory Visit (HOSPITAL_COMMUNITY)

## 2024-01-17 DIAGNOSIS — I2782 Chronic pulmonary embolism: Secondary | ICD-10-CM | POA: Diagnosis not present

## 2024-01-17 DIAGNOSIS — I10 Essential (primary) hypertension: Secondary | ICD-10-CM | POA: Diagnosis not present

## 2024-01-17 DIAGNOSIS — Z515 Encounter for palliative care: Secondary | ICD-10-CM | POA: Diagnosis not present

## 2024-01-17 DIAGNOSIS — Z7189 Other specified counseling: Secondary | ICD-10-CM | POA: Diagnosis not present

## 2024-01-17 DIAGNOSIS — K56609 Unspecified intestinal obstruction, unspecified as to partial versus complete obstruction: Secondary | ICD-10-CM | POA: Diagnosis not present

## 2024-01-17 DIAGNOSIS — C482 Malignant neoplasm of peritoneum, unspecified: Secondary | ICD-10-CM | POA: Diagnosis not present

## 2024-01-17 LAB — COMPREHENSIVE METABOLIC PANEL WITH GFR
ALT: 34 U/L (ref 0–44)
AST: 50 U/L — ABNORMAL HIGH (ref 15–41)
Albumin: <1.5 g/dL — ABNORMAL LOW (ref 3.5–5.0)
Alkaline Phosphatase: 80 U/L (ref 38–126)
Anion gap: 13 (ref 5–15)
BUN: 15 mg/dL (ref 6–20)
CO2: 24 mmol/L (ref 22–32)
Calcium: 7.4 mg/dL — ABNORMAL LOW (ref 8.9–10.3)
Chloride: 94 mmol/L — ABNORMAL LOW (ref 98–111)
Creatinine, Ser: 0.86 mg/dL (ref 0.44–1.00)
GFR, Estimated: >60 mL/min (ref >60)
Glucose, Bld: 141 mg/dL — ABNORMAL HIGH (ref 70–99)
Potassium: 3.5 mmol/L (ref 3.5–5.1)
Sodium: 131 mmol/L — ABNORMAL LOW (ref 135–145)
Total Bilirubin: 1.9 mg/dL — ABNORMAL HIGH (ref 0.0–1.2)
Total Protein: 4.9 g/dL — ABNORMAL LOW (ref 6.5–8.1)

## 2024-01-17 LAB — CBC
HCT: 35 % — ABNORMAL LOW (ref 36.0–46.0)
Hemoglobin: 11.3 g/dL — ABNORMAL LOW (ref 12.0–15.0)
MCH: 33 pg (ref 26.0–34.0)
MCHC: 32.3 g/dL (ref 30.0–36.0)
MCV: 102.3 fL — ABNORMAL HIGH (ref 80.0–100.0)
Platelets: 46 K/uL — ABNORMAL LOW (ref 150–400)
RBC: 3.42 MIL/uL — ABNORMAL LOW (ref 3.87–5.11)
RDW: 25.6 % — ABNORMAL HIGH (ref 11.5–15.5)
WBC: 8.5 K/uL (ref 4.0–10.5)
nRBC: 0.2 % (ref 0.0–0.2)

## 2024-01-17 LAB — GLUCOSE, CAPILLARY
Glucose-Capillary: 116 mg/dL — ABNORMAL HIGH (ref 70–99)
Glucose-Capillary: 128 mg/dL — ABNORMAL HIGH (ref 70–99)
Glucose-Capillary: 131 mg/dL — ABNORMAL HIGH (ref 70–99)
Glucose-Capillary: 138 mg/dL — ABNORMAL HIGH (ref 70–99)
Glucose-Capillary: 141 mg/dL — ABNORMAL HIGH (ref 70–99)
Glucose-Capillary: 155 mg/dL — ABNORMAL HIGH (ref 70–99)

## 2024-01-17 LAB — MRSA NEXT GEN BY PCR, NASAL: MRSA by PCR Next Gen: NOT DETECTED

## 2024-01-17 LAB — APTT: aPTT: 129 s — ABNORMAL HIGH (ref 24–36)

## 2024-01-17 LAB — HEPARIN LEVEL (UNFRACTIONATED): Heparin Unfractionated: >1.1 [IU]/mL — ABNORMAL HIGH (ref 0.30–0.70)

## 2024-01-17 MED ORDER — DEXTROSE 10 % IV SOLN: INTRAVENOUS | Status: DC

## 2024-01-17 MED ORDER — FERROUS SULFATE 325 (65 FE) MG PO TABS
325.0000 mg | ORAL_TABLET | Freq: Every day | ORAL | Status: DC
  Administered 2024-01-19 – 2024-01-23 (×4): 325 mg via ORAL
  Filled 2024-01-17 (×5): qty 1

## 2024-01-17 NOTE — Plan of Care (Signed)
  Problem: Coping: Goal: Level of anxiety will decrease Outcome: Progressing   Problem: Elimination: Goal: Will not experience complications related to bowel motility Outcome: Progressing   Problem: Pain Managment: Goal: General experience of comfort will improve and/or be controlled Outcome: Progressing   Problem: Safety: Goal: Ability to remain free from injury will improve Outcome: Progressing   Problem: Skin Integrity: Goal: Risk for impaired skin integrity will decrease Outcome: Progressing

## 2024-01-17 NOTE — Progress Notes (Addendum)
 Daily Progress Note   Patient Name: Nicole Santos       Date: 01/17/2024 DOB: 1971-03-10  Age: 53 y.o. MRN#: [id number] Attending Physician: Vicci Afton CROME, MD Primary Care Physician: Jolinda Norene HERO, DO Admit Date: 01/06/2024  Reason for Consultation/Follow-up: Establishing goals of care  Subjective:  53 y.o. female  with past medical history of ovarian cancer (11/14/2021), peritoneal carcinomatosis, history of cervical cancer (2000), sleep apnea (not on CPAP), hypertension, depression, anxiety, history of PE, history of diabetes that resolved with weight loss admitted on 01/06/2024 with intractable nausea and vomiting.   Recurrent hospitalizations for SBO (most recent 11/2023). Managed conservatively. Symptoms improved with bowel rest.  She was discharged home.  She was followed by her GYN oncologist and medical oncologist in the interim.  Unfortunately, she had a worsening in her abdominal pain as well as some associated nausea and vomiting and was readmitted on 01/06/2024 she underwent exploratory laparotomy on 01/07/2024.  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  Surgical team unable to release any of her small bowel that was adherent to her pelvis and as such an end ileostomy was placed.  Noted to have numerous nodules throughout her abdomen and significant peritoneal carcinomatosis.  Admitted to the floor for further management and pain control.  Surgery recommends palliative referral for goals of care.   01/08/2024: Goals of care discussion completed.  Patient declines to make any changes to her advance directives at this time.  She is hopeful to hear from her oncology team about treatment options.  She also does not wish to make any changes to her advance directives without first discussing with her entire family.   She will let us  know if she desires family meeting but declined need for 1 at this time.  We adjusted her pain regimen due to end of dose breakthrough pain.   01/09/2024: Symptom management reviewed. Symptoms stable/improving. Not interested in GOC/ACP discussion until able to speak with oncology.    01/10/2024: Followed up for ongoing goals of care and symptom management.  She continued to require IV opioids for pain control.  Nausea and GERD symptoms had improved.  She declined to update advanced directives but would consider outpatient palliative referral.  Will let me know if she decides to proceed with this.  01/14/2024: Continue to require IV opioids for pain management.  Continue to be followed by surgery and admitting team.  Goals of care and advance care planning discussion attempted.  Patient declined to make any changes to her advance directive at that time.  Agreeable to outpatient palliative referral.  In the interim, patient has had significant decline.  Blood sugars dropping requiring IV dextrose .  Developed tachycardia.  Had to be admitted to the ICU.  White blood cell count became elevated.  Significant drainage noted from midline incision.  Repeat CT abdomen and pelvis concerning for abscess versus ascites.  IR consulted and felt there was not enough for drain placement.  Surgery aware.  Plan was for ultrasound paracentesis to collect fluid to rule out infection.  Unfortunately, No paracentesis was able to be performed as there was no free fluid on limited abdominal ultrasound obtained 01/17/2024.  Labs reviewed.  White cell count improved today compared to yesterday (8.5 versus 11.1).  Continues to be mildly anemic with a hemoglobin in 11 range (relatively stable compared to previous).  Limited abdominal ultrasound images independently reviewed.  No significant fluid collection appreciated.  Blood pressure levels in the 100 systolic and heart rate in the 110s range.  Improvement in opioid  requirement on 24-hour look back.  Appears to have only required 1 dose of IV hydromorphone  at 0.5 mg on 24-hour look back.  Per therapy notes, patient repeatedly refused therapy sessions therefore, she was discharged from current caseload and they will follow-up as needed if she indicates a willingness to participate in therapy.  Today, patient lying in bed.  She does report some significant pain at an 8 out of 10 at this time.  Patient's husband and daughter are at bedside.  We had a long discussion.  I shared our concerns that patient's health has taken a significant turn for the worse.  Shared concerns implications of her acute decline requiring ICU admission despite aggressive measures in place.  Shared our concern that while we continue to treat her aggressively and had hoped that she would make improvements and be able to discharge home, it is likely that her health will continue to decline and may decline to the point of resulting in cardiac/respiratory arrest.  Discussed the limitations of CPR and intubation and individual suffering from terminal cancer.  Discussed her goals and wishes.  She provides limited insight into her current goals and wishes at this time, but based off of our previous goals of care conversations especially in light of new developments, would recommend she consider DNR/DNI, continue to treat the treatable but consider transitioning to comfort care if she were to develop any significant further declines.  Patient's general surgeon (Dr. Evonnie) also rounded during my visit and reiterated the medical team's concerns and ability to recover from this current illness.  Patient's daughter and husband also present for this conversation.  Patient states that she understands and will think about our discussion and speak with her family.  Agreed that I can come back later today to see if she has made a decision.  Check back in, spoke with patient and husband again.  Reiterated previous  discussion and our concerns.  Repeated previous recommendation.  Patient states that she is not ready to make a decision yet.  I spoke with patient's husband who states that he would not want her her to go through CPR or be put on a ventilator and would not want her to suffer but states that it is ultimately up to her.  Patient again declines to make a decision but says I can come back this afternoon to discuss.  Check back in the afternoon, spoke with patient, husband, and daughter at bedside.  Reiterated concerns/recommendations.  Patient states that she is not ready to talk about  this anymore.  I did let her know that in the event they do not elect DNR/DNI status and if she were to have a cardiopulmonary event she would receive CPR and be placed on a ventilator.  She states that she understands this.  Patient's daughter states that she believes patient is waiting for the son to get here so that they can talk about everything and process as a family.  Offered to come back and assist with family meeting when son arrives.  Patient and family state they will consider and will let the nurse know to call me back if he does come.  Patient will remain full code/full scope at this time.  Encouraged patient and family to call with any questions or concerns.  Chart review/care coordination:  Completed extensive chart review including EPIC notes, lab results and imaging (independently reviewed), medication administration record, vital signs. Coordinated care with bedside nursing staff, attending physician, general surgery and TOC.   Length of Stay: 10   Physical Exam Constitutional:      Comments: Chronically ill-appearing, appears fatigued  Pulmonary:     Effort: Pulmonary effort is normal. No respiratory distress.  Skin:    General: Skin is warm.     Coloration: Skin is pale.  Neurological:     Mental Status: She is alert.             Vital Signs: BP 114/75   Pulse (!) 112   Temp 97.8 F (36.6  C) (Oral)   Resp (!) 21   Ht 5' 2 (1.575 m)   Wt 79.9 kg   SpO2 98%   BMI 32.22 kg/m  SpO2: SpO2: 98 % O2 Device: O2 Device: Room Air O2 Flow Rate:        Palliative Assessment/Data: 40%   Palliative Care Assessment & Plan   Patient Profile/Assessment:  53 y.o. female with past medical history of ovarian cancer (11/14/2021), peritoneal carcinomatosis, history of cervical cancer (2000), sleep apnea (not on CPAP), hypertension, depression, anxiety, history of PE, history of diabetes that resolved with weight loss admitted on 01/06/2024 with intractable nausea and vomiting. Diagnosed with recurrent small bowel obstruction (most recently hospitalized 6/25 with the same, managed conservatively). Underwent exploratory laparotomy on 01/07/2024. 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 As such, a palliative end ileostomy was placed.  Numerous and extensive discussions have been had regarding goals of care, advance care planning, and advance directives.  Her goal is to return home with her family.  Patient with an acute decline in her overall health status requiring admission to the ICU.  Goals of care and advance care planning discussion completed given acute decline in health status, presence of terminal cancer, and poor prognosis.  Education provided regarding limitations of CPR and mechanical ventilation in someone with terminal cancer.  Patient and family verbalized understanding of education provided.  They declined to make any changes to advance directives at this time.  Patient does understand that at this point if she were to go into cardiopulmonary arrest, she would receive CPR and mechanical ventilation.  She plans to further speak with family about her goals and wishes.  Patient continues to require IV opioids for pain management.  Fortunately, decreasing requirements over the past 24 hours.  Encouraged to reach back out to palliative team if any  further assistance is needed.  Will continue to follow and support holistically.  Recommendations/Plan:  Full code full scope Ongoing goals of  care discussion and anticipatory care planning, palliative inpatient team will continue to follow during admission  Continue current symptom regimen Inpatient palliative team will continue to follow to address bolds of care and symptom management   Symptom management:  Continue Tylenol  1000 mg p.o. every 8 hours Continue hydromorphone  0.5 mg IV every 3 hours as needed (total 0.5 mg on 24-hour look back, improving) Continue Robaxin  500 mg p.o. every 8 hours Continue Protonix  40 mg p.o. daily Continue Zofran  4 mg every 8 hours as needed Continue oxycodone  10 mg every 4 hours as needed (none given on 24 hr look back) Continue Megace  for appetite support    Prognosis:  Poor     Discharge Planning: To Be Determined    Detailed review of medical records (labs, imaging, vital signs), medically appropriate exam, discussed with treatment team, counseling and education to patient, family, & staff, documenting clinical information, medication management, coordination of care   Total time:  I spent 90 minutes in the care of the patient today in the above activities and documenting the encounter.         Laymon CHRISTELLA Pinal, NP  Palliative Medicine Team Team phone # [phone]  Thank you for allowing the Palliative Medicine Team to assist in the care of this patient. Please utilize secure chat with additional questions, if there is no response within 30 minutes please call the above phone number.  Palliative Medicine Team providers are available by phone from 7am to 7pm daily and can be reached through the team cell phone.  Should this patient require assistance outside of these hours, please call the patient's attending physician.

## 2024-01-17 NOTE — Progress Notes (Signed)
 [facility]  Wound VAC was applied to assist with management of the patient's midline drainage.  A thin strip of white sponge was placed deep within the midline wound.  This was covered with a small piece of black sponge.  Wound VAC adhesive was then placed and attached to the wound VAC.  Wound VAC was set at 75 mmHg.  Recommend changing dressing every 2 to 3 days pending seal and saturation.  Will plan to obtain wound culture at next dressing change.  Orders in place.  Dorothyann Brittle, DO [facility] [address] [place], [name] [address] [phone] (office)

## 2024-01-17 NOTE — Progress Notes (Signed)
 PHARMACY - ANTICOAGULATION CONSULT NOTE  Pharmacy Consult for heparin  Indication: DVT  Patient Measurements: Height: 5' 2 (157.5 cm) Weight: 79.9 kg (176 lb 2.4 oz) IBW/kg (Calculated) : 50.1 HEPARIN  DW (KG): 67.8  Labs: Recent Labs    01/14/24 0430 01/15/24 0513 01/16/24 0504 01/16/24 1554 01/17/24 0205  HGB 11.4* 11.5* 11.8*  --  11.3*  HCT 34.1* 35.0* 35.9*  --  35.0*  PLT 59* 67* 59*  --  46*  APTT  --   --  166* 166* 129*  HEPARINUNFRC  --   --  >1.10*  --  >1.10*  CREATININE 0.55 0.73 0.78  --   --     Estimated Creatinine Clearance: 77.8 mL/min (by C-G formula based on SCr of 0.78 mg/dL).  Assessment: 53 year old female who was admitted with concern for malignant small bowel obstruction. She is status post exploratory lap, lysis of adhesions, small bowel resection, and creation of a palliative end ileostomy on 8/5.  Pt has foul smelling drainage from her midline, so likely has an abscess per surgery . Zosyn  started. Wants to transition eliquis  to heparin . Patient with history of PE is reason for anticoagulation.  Platelets:   85> 77> 59> 67 watch closely , if drops below 50K would consider d/c anticoagulation  AM: heparin  level falsely elevated and aPTT high on 700 units/hr + holding for additional hour. Per RN, no s/sx of bleeding. Plts now 46; Hgb low stable  Goal of Therapy:  Heparin  level 0.3-0.7 units/ml APTT 66-102 Monitor platelets by anticoagulation protocol: Yes   Plan:  Hold heparin  infusion for now Follow up when to restart heparin  gtt Continue to monitor H&H and platelets  Lynwood Poplar, PharmD, BCPS Clinical Pharmacist 01/17/2024 3:01 AM

## 2024-01-17 NOTE — Plan of Care (Signed)
  Problem: Education: Goal: Knowledge of General Education information will improve Description: Including pain rating scale, medication(s)/side effects and non-pharmacologic comfort measures Outcome: Progressing   Problem: Health Behavior/Discharge Planning: Goal: Ability to manage health-related needs will improve Outcome: Progressing   Problem: Clinical Measurements: Goal: Ability to maintain clinical measurements within normal limits will improve Outcome: Progressing Goal: Will remain free from infection Outcome: Progressing Goal: Diagnostic test results will improve Outcome: Progressing Goal: Respiratory complications will improve Outcome: Progressing Goal: Cardiovascular complication will be avoided Outcome: Progressing   Problem: Coping: Goal: Level of anxiety will decrease Outcome: Progressing   Problem: Elimination: Goal: Will not experience complications related to bowel motility Outcome: Progressing Goal: Will not experience complications related to urinary retention Outcome: Progressing   Problem: Pain Managment: Goal: General experience of comfort will improve and/or be controlled Outcome: Progressing

## 2024-01-17 NOTE — Progress Notes (Signed)
 [facility] Progress Note  10 Days Post-Op  Subjective: Patient seen and examined.  She is resting comfortably in bed.  She is more somnolent today and has no appetite.  She states that she has not eaten anything in the last 2 days.  She denies any significant abdominal pain.  She continues to have brown/purulent appearing drainage from her midline incision.  Ostomy has green formed stool in bag.  Objective: Vital signs in last 24 hours: Temp:  [97.4 F (36.3 C)-98.4 F (36.9 C)] 98 F (36.7 C) (08/15 1124) Pulse Rate:  [111-118] 112 (08/15 1000) Resp:  [18-24] 21 (08/15 1000) BP: (101-118)/(68-98) 114/75 (08/15 1000) SpO2:  [97 %-100 %] 98 % (08/15 1000) Last BM Date : 01/16/24  Intake/Output from previous day: 08/14 0701 - 08/15 0700 In: 745.6 [I.V.:539.4; IV Piggyback:206.3] Out: 650 [Urine:650] Intake/Output this shift: Total I/O In: 20 [I.V.:20] Out: -   General appearance: alert, cooperative, and no distress GI: Abdomen soft, nondistended, no percussion tenderness, minimal incisional tenderness to palpation; no rigidity, guarding, rebound tenderness; right-sided ileostomy pink and patent with formed green stool in bag, midline incision with brown/purulent drainage  Lab Results:  Recent Labs    01/16/24 0504 01/17/24 0205  WBC 11.1* 8.5  HGB 11.8* 11.3*  HCT 35.9* 35.0*  PLT 59* 46*   BMET Recent Labs    01/16/24 0504 01/17/24 0205  NA 131* 131*  K 3.3* 3.5  CL 97* 94*  CO2 21* 24  GLUCOSE 121* 141*  BUN 16 15  CREATININE 0.78 0.86  CALCIUM  7.8* 7.4*   PT/INR No results for input(s): LABPROT, INR in the last 72 hours.  Studies/Results: US  ASCITES (ABDOMEN LIMITED) Result Date: 01/17/2024 CLINICAL DATA:  [id number] Infected ascites [id number] Patient with concern for malignant small bowel obstruction s/p exploratory laparotomy, lysis of adhesions, small bowel resection and creation of end ileostomy 01/07/24. Patient recently noted to have  purulent drainage from midline incision and CT abd/pelvis shows enhancing peritoneal fluid in bilateral pericolic gutters concerning for infection. Request for possible paracentesis. EXAM: LIMITED ABDOMEN ULTRASOUND FOR ASCITES TECHNIQUE: Limited ultrasound survey for ascites was performed in all four abdominal quadrants. COMPARISON:  CT abdomen pelvis with contrast 01/15/2024 FINDINGS: Limited abdominal ultrasound of all 4 quadrants and midline was performed at bedside. No free fluid seen. IMPRESSION: No free fluid within the imaged abdomen. Paracentesis was NOT performed. Electronically Signed   By: Thom Hall M.D.   On: 01/17/2024 10:41   CT ABDOMEN PELVIS W CONTRAST Result Date: 01/15/2024 CLINICAL DATA:  Evaluate for abscess.  History of ovarian cancer. EXAM: CT ABDOMEN AND PELVIS WITH CONTRAST TECHNIQUE: Multidetector CT imaging of the abdomen and pelvis was performed using the standard protocol following bolus administration of intravenous contrast. RADIATION DOSE REDUCTION: This exam was performed according to the departmental dose-optimization program which includes automated exposure control, adjustment of the mA and/or kV according to patient size and/or use of iterative reconstruction technique. CONTRAST:  OMNIPAQUE  IOHEXOL  300 MG/ML  SOLN COMPARISON:  CT abdomen and pelvis 01/06/2024 FINDINGS: Lower chest: There is a new trace left pleural effusion. Hepatobiliary: Gallstones are again seen. No biliary ductal dilatation. There is new subcapsular enhancing fluid collection measuring 11 mm along the inferior left margin of the liver. Small foci of air seen within this collection. Pancreas: Unremarkable. No pancreatic ductal dilatation or surrounding inflammatory changes. Spleen: Normal in size without focal abnormality. Adrenals/Urinary Tract: There is a 2.5 cm cyst in the left kidney. Otherwise, the  kidneys and adrenal glands are within normal limits. There is diffuse bladder wall thickening,  particularly along the dome of the bladder with surrounding inflammation. There is a small amount of air in the bladder which is new. Stomach/Bowel: There is a new right-sided ostomy present. Bowel loops leading to the ostomy demonstrates diffuse wall thickening with surrounding inflammation over moderate length segment. Small bowel proximal to this level is dilated with air-fluid levels measuring up to 3.8 cm. The stomach is moderately distended with fluid. There is gastric antral wall thickening. Visualized colonic loops appear decompressed. The appendix is not seen. No pneumatosis. Vascular/Lymphatic: Aorta and IVC are normal in in size. There are atherosclerotic calcifications of the aorta. Vascular stent is seen in the left common iliac artery. No discrete enlarged lymph nodes are identified. Reproductive: Uterus appears small and unchanged. Adnexa are unremarkable. Other: There is a new small amount of free air in the anterior abdomen which may be related to recent surgery. Enhancing peritoneal fluid collections are seen in the bilateral pericolic gutters, new from prior. Left-sided collection measures 3.6 x 5.8 by 10 cm. Right-sided collection measures 4.7 by 2.1 by 8.6 cm. Small areas of enhancing peritoneal fluid are noted in the lower anterior pelvis. There is some tethering of small bowel loops in the anterior pelvis similar to prior. There is new midline abdominal wall skin staple line and subcutaneous edema. Medication injection site noted in the left anterior abdomen. Musculoskeletal: No acute fracture. IMPRESSION: 1. New right-sided ostomy. Bowel loops leading to the ostomy demonstrates diffuse wall thickening with surrounding inflammation worrisome for nonspecific enteritis. Small bowel proximal to this level is dilated with air-fluid levels worrisome for small bowel obstruction. 2. New enhancing peritoneal fluid collections in the bilateral pericolic gutters and lower anterior pelvis worrisome for  abscesses. 3. New subcapsular enhancing fluid collection along the inferior left margin of the liver worrisome for abscess. 4. New small amount of free air in the anterior abdomen may be related to recent surgery. 5. New trace left pleural effusion. 6. Diffuse bladder wall thickening with surrounding inflammation worrisome for cystitis. There is a small amount of air in the bladder which may be related to recent instrumentation. 7. Gastric antral wall thickening worrisome for gastritis. 8. Cholelithiasis. 9. Aortic atherosclerosis. Aortic Atherosclerosis (ICD10-I70.0). Electronically Signed   By: Greig Pique M.D.   On: 01/15/2024 17:36    Anti-infectives: Anti-infectives (From admission, onward)    Start     Dose/Rate Route Frequency Ordered Stop   01/15/24 1000  piperacillin -tazobactam (ZOSYN ) IVPB 3.375 g        3.375 g 12.5 mL/hr over 240 Minutes Intravenous Every 8 hours 01/15/24 0920     01/08/24 0600  ceFAZolin  (ANCEF ) IVPB 2g/100 mL premix        2 g 200 mL/hr over 30 Minutes Intravenous On call to O.R. 01/07/24 1323 01/07/24 1448   01/07/24 1323  ceFAZolin  (ANCEF ) 2-4 GM/100ML-% IVPB       Note to Pharmacy: Durenda Clotilda HERO: cabinet override      01/07/24 1323 01/07/24 1455       Assessment/Plan:  Patient is a 53 year old female who was admitted with concern for malignant small bowel obstruction.  She is status post exploratory lap, lysis of adhesions, small bowel resection, and creation of a palliative end ileostomy on 8/5.   -CT of the abdomen and pelvis on 8/13 demonstrated new enhancing peritoneal fluid collections in the bilateral paracolic gutters in the lower anterior pelvis worrisome for  abscesses, possible subcapsular enhancing fluid collection by the liver worrisome for abscess, small amount of free air, likely related to recent surgery, and possible concern for enteritis, cystitis, and gastritis. -IR attempted paracentesis today without any significant free fluid  noted in all 4 quadrants -Will discuss potentially placing wound VAC on her midline wound to allow for controlling current drainage -Will obtain a wound culture from her midline wound/drainage to help guide IV antibiotics -IV Zosyn   -Leukocytosis resolved, 8.5 from 11.1.  Will continue to monitor with daily labs -Patient's hemoglobin remained stable.  Heparin  drip held secondary to thrombocytopenia -Continue GI soft diet as patient tolerates -Will need to monitor amount of ostomy output.  Patiently currently receiving daily Imodium .  Would continue this to decrease risk of dehydration given fluid losses from her midline wound. -Discussed with the patient that given her significant cancer burden, she does not have the reserves and immune response to fully compensate for her current infection.  She has been transferred down to the stepdown ICU for closer clinical monitoring, and while we are doing everything possible, she could potentially die during this hospital admission.  Discussed goals of care with the patient and CODE STATUS.  Patient and her husband will think about it, and palliative NP will be back to discuss possible changes to her status.  All questions answered to their expressed satisfaction -Appreciate palliative care recommendations -Appreciate hospitalist recommendations -Dr. Kallie will be seeing this patient for me over the weekend and into next week   LOS: 10 days    Tattianna Schnarr A Imanni Burdine 01/17/2024  Note: Portions of this report may have been transcribed using voice recognition software. Every effort has been made to ensure accuracy; however, inadvertent computerized transcription errors may still be present.

## 2024-01-17 NOTE — Progress Notes (Signed)
 PROGRESS NOTE   Nicole Santos  FMW:[id number] DOB: [birth year] DOA: 01/06/2024 PCP: Jolinda Norene HERO, DO   Chief Complaint  Patient presents with   Abdominal Pain   Level of care: Stepdown  Brief Admission History:  53 y.o. female with medical history significant for ovarian cancer, peritoneal carcinomatosis, pulmonary embolism, hypertension.  Patient presented to the ED with complaints of worsening of her chronic abdominal pain that started 3 days ago, with vomiting.  She has been compliant with soft diet as recommended by her providers, but she reports persistent pain with oral intake.   Last hospitalization 11/04/2023 through 11/07/2023 with partial small bowel obstruction-managed conservatively   ED Course: Initial tachycardia heart rate up to 127 now improved. CT AP with contrast-suggest small bowel obstruction.  EDP talked to general surgeon Dr. Evonnie, will see in consult, can hold NG tube as patient has not had any vomiting in 2 days.   01/07/2024 status post exploratory laparotomy, lysis of adhesion, small bowel resection,  ileostomy creation the    Assessment and Plan:  Small bowel obstruction Recurrent small bowel obstruction- last hospitalization 11/04/2023 through 11/07/2023 with partial small bowel obstruction-managed conservatively.  History of ovarian cancer with peritoneal carcinomatosis. -CTAP WC today-multiple persistent fluid-filled dilated small bowel loops, slight interval worsening.  Distal obstruction with transition point likely over the midline pelvis.  Also findings consistent with carcinomatosis. (See detailed report) - Dr. Evonnie, consulting - CT abd/pelvis ordered to rule out abscess -- findings concerning for abscess vs ascites with infection - holding apixaban  for now, IV heparin  on hold now due to thrombocytopenia   - Morphine  2 mg every 4 hourly as needed - Zofran  as needed - IR consulted regarding drain -- feels not enough collection for drain placement,  US  paracentesis ordered for evaluation of fluid to rule out infection, etc. IR reviewed US  and not enough fluid in the 4 quadrants for paracentesis.   -- now having diarrhea, added antidiarrheals as needed for symptoms -- pt remains on Zosyn  IV per surgeon and plan is to obtain a wound culture at next dressing change, wound Vac placed by surgeon.   Pulmonary embolism Reports compliance with Eliquis .  Hold Eliquis  due to thrombocytopenia. -IV heparin  infusion had to be stopped due to thrombocytopenia   Primary peritoneal carcinomatosis  [facility] Dr. Rogers[facility].  S/p 3 cycles of adj platinum-based therapy after surgery, currently on olaparib , her maintenance bevacizumab  is being held per notes. -- pt continues to decline, I called for another more urgent goals of care discussion today with palliative team as I worry that she is dying and want to unburden her from unnecessary pain and suffering especially if CPR is required and intubation which I fear will only increase her suffering when the cancer is incurable and advancing  Essential hypertension Stable.  Not on antihypertensives  Hypoglycemia with hypoglycemic unawareness  -- exacerbated by poor oral intake -- not on any antihyperglycemic agents -- schedule CBG every 4 hours -- encourage oral intake -- concerning that this has been occurring more frequently than we realize -- pt still not eating/drinking well and now remains dependent on continuous D10 infusion to avoid recurrent hypoglycemia   DVT prophylaxis: IV heparin  Code Status: Full  Family Communication: husband updated at bedside  Disposition: TBD    Consultants:  surgery  Procedures:   Antimicrobials:  Zosyn  8/13>>   Subjective: Pt still not eating or drinking, having a lot of abdominal pain and discomfort and  appears more ill and decompensated today     Objective: Vitals:   01/17/24 0730 01/17/24 0931 01/17/24 1000 01/17/24 1124   BP: 118/78  114/75   Pulse:   (!) 112   Resp: 18  (!) 21   Temp: 98 F (36.7 C) 97.8 F (36.6 C)  98 F (36.7 C)  TempSrc: Oral Oral  Oral  SpO2: 97%  98%   Weight:      Height:   5' 2 (1.575 m)     Intake/Output Summary (Last 24 hours) at 01/17/2024 1324 Last data filed at 01/17/2024 1237 Gross per 24 hour  Intake 765.61 ml  Output 450 ml  Net 315.61 ml   Filed Weights   01/06/24 1313 01/06/24 1924 01/07/24 1359  Weight: 80.6 kg 79.9 kg 79.9 kg   Examination:  General exam: appears chronically ill and decompensated.  Arousable. Uncomfortable.   Respiratory system: shallow breathing bilateral. Cardiovascular system: normal S1 & S2 heard. No JVD, murmurs, rubs, gallops or clicks. 1+ pedal edema. Gastrointestinal system: Abdomen is distended, very tender.  Copious brown drainage seen from incision.  No organomegaly or masses felt. hypoactive bowel sounds heard. Central nervous system: Alert and oriented. No focal neurological deficits. Extremities: Symmetric 5 x 5 power. Skin: No rashes, lesions or ulcers. Psychiatry: Judgement and insight appear normal. Mood & affect appropriate.   Data Reviewed: I have personally reviewed following labs and imaging studies  CBC: Recent Labs  Lab 01/13/24 0354 01/14/24 0430 01/15/24 0513 01/16/24 0504 01/17/24 0205  WBC 6.0 8.1 9.5 11.1* 8.5  HGB 11.2* 11.4* 11.5* 11.8* 11.3*  HCT 33.4* 34.1* 35.0* 35.9* 35.0*  MCV 101.5* 100.9* 102.0* 102.3* 102.3*  PLT 53* 59* 67* 59* 46*    Basic Metabolic Panel: Recent Labs  Lab 01/12/24 0322 01/13/24 0354 01/14/24 0430 01/15/24 0513 01/16/24 0504 01/17/24 0205  NA 135 133* 132* 133* 131* 131*  K 2.7* 3.4* 3.9 4.8 3.3* 3.5  CL 96* 96* 102 100 97* 94*  CO2 28 25 22 22  21* 24  GLUCOSE 139* 113* 62* 48* 121* 141*  BUN 17 11 11 15 16 15   CREATININE 0.68 0.57 0.55 0.73 0.78 0.86  CALCIUM  7.6* 7.6* 7.8* 8.0* 7.8* 7.4*  MG 1.3*  --   --   --   --   --     CBG: Recent Labs  Lab  01/16/24 1950 01/17/24 0014 01/17/24 0450 01/17/24 0711 01/17/24 1120  GLUCAP 138* 155* 138* 131* 128*    Recent Results (from the past 240 hours)  MRSA Next Gen by PCR, Nasal     Status: None   Collection Time: 01/17/24 10:18 AM   Specimen: Nasal Mucosa; Nasal Swab  Result Value Ref Range Status   MRSA by PCR Next Gen NOT DETECTED NOT DETECTED Final    Comment: (NOTE) The GeneXpert MRSA Assay (FDA approved for NASAL specimens only), is one component of a comprehensive MRSA colonization surveillance program. It is not intended to diagnose MRSA infection nor to guide or monitor treatment for MRSA infections. Test performance is not FDA approved in patients less than 68 years old. Performed at [facility], [address], [place], [name] [address]      Radiology Studies: US  ASCITES (ABDOMEN LIMITED) Result Date: 01/17/2024 CLINICAL DATA:  [id number] Infected ascites [id number] Patient with concern for malignant small bowel obstruction s/p exploratory laparotomy, lysis of adhesions, small bowel resection and creation of end ileostomy 01/07/24. Patient recently noted to have purulent drainage from midline  incision and CT abd/pelvis shows enhancing peritoneal fluid in bilateral pericolic gutters concerning for infection. Request for possible paracentesis. EXAM: LIMITED ABDOMEN ULTRASOUND FOR ASCITES TECHNIQUE: Limited ultrasound survey for ascites was performed in all four abdominal quadrants. COMPARISON:  CT abdomen pelvis with contrast 01/15/2024 FINDINGS: Limited abdominal ultrasound of all 4 quadrants and midline was performed at bedside. No free fluid seen. IMPRESSION: No free fluid within the imaged abdomen. Paracentesis was NOT performed. Electronically Signed   By: Thom Hall M.D.   On: 01/17/2024 10:41   CT ABDOMEN PELVIS W CONTRAST Result Date: 01/15/2024 CLINICAL DATA:  Evaluate for abscess.  History of ovarian cancer. EXAM: CT ABDOMEN AND PELVIS WITH CONTRAST TECHNIQUE: Multidetector  CT imaging of the abdomen and pelvis was performed using the standard protocol following bolus administration of intravenous contrast. RADIATION DOSE REDUCTION: This exam was performed according to the departmental dose-optimization program which includes automated exposure control, adjustment of the mA and/or kV according to patient size and/or use of iterative reconstruction technique. CONTRAST:  OMNIPAQUE  IOHEXOL  300 MG/ML  SOLN COMPARISON:  CT abdomen and pelvis 01/06/2024 FINDINGS: Lower chest: There is a new trace left pleural effusion. Hepatobiliary: Gallstones are again seen. No biliary ductal dilatation. There is new subcapsular enhancing fluid collection measuring 11 mm along the inferior left margin of the liver. Small foci of air seen within this collection. Pancreas: Unremarkable. No pancreatic ductal dilatation or surrounding inflammatory changes. Spleen: Normal in size without focal abnormality. Adrenals/Urinary Tract: There is a 2.5 cm cyst in the left kidney. Otherwise, the kidneys and adrenal glands are within normal limits. There is diffuse bladder wall thickening, particularly along the dome of the bladder with surrounding inflammation. There is a small amount of air in the bladder which is new. Stomach/Bowel: There is a new right-sided ostomy present. Bowel loops leading to the ostomy demonstrates diffuse wall thickening with surrounding inflammation over moderate length segment. Small bowel proximal to this level is dilated with air-fluid levels measuring up to 3.8 cm. The stomach is moderately distended with fluid. There is gastric antral wall thickening. Visualized colonic loops appear decompressed. The appendix is not seen. No pneumatosis. Vascular/Lymphatic: Aorta and IVC are normal in in size. There are atherosclerotic calcifications of the aorta. Vascular stent is seen in the left common iliac artery. No discrete enlarged lymph nodes are identified. Reproductive: Uterus appears  small and unchanged. Adnexa are unremarkable. Other: There is a new small amount of free air in the anterior abdomen which may be related to recent surgery. Enhancing peritoneal fluid collections are seen in the bilateral pericolic gutters, new from prior. Left-sided collection measures 3.6 x 5.8 by 10 cm. Right-sided collection measures 4.7 by 2.1 by 8.6 cm. Small areas of enhancing peritoneal fluid are noted in the lower anterior pelvis. There is some tethering of small bowel loops in the anterior pelvis similar to prior. There is new midline abdominal wall skin staple line and subcutaneous edema. Medication injection site noted in the left anterior abdomen. Musculoskeletal: No acute fracture. IMPRESSION: 1. New right-sided ostomy. Bowel loops leading to the ostomy demonstrates diffuse wall thickening with surrounding inflammation worrisome for nonspecific enteritis. Small bowel proximal to this level is dilated with air-fluid levels worrisome for small bowel obstruction. 2. New enhancing peritoneal fluid collections in the bilateral pericolic gutters and lower anterior pelvis worrisome for abscesses. 3. New subcapsular enhancing fluid collection along the inferior left margin of the liver worrisome for abscess. 4. New small amount of free air in  the anterior abdomen may be related to recent surgery. 5. New trace left pleural effusion. 6. Diffuse bladder wall thickening with surrounding inflammation worrisome for cystitis. There is a small amount of air in the bladder which may be related to recent instrumentation. 7. Gastric antral wall thickening worrisome for gastritis. 8. Cholelithiasis. 9. Aortic atherosclerosis. Aortic Atherosclerosis (ICD10-I70.0). Electronically Signed   By: Greig Pique M.D.   On: 01/15/2024 17:36    Scheduled Meds:  acetaminophen   1,000 mg Oral Q8H   atorvastatin   20 mg Oral QHS   Chlorhexidine  Gluconate Cloth  6 each Topical Daily   DULoxetine   60 mg Oral Daily   feeding  supplement  1 Container Oral TID BM   [START ON 01/18/2024] ferrous sulfate   325 mg Oral Q breakfast   loperamide   2 mg Oral Daily   megestrol   400 mg Oral Daily   methocarbamol   500 mg Oral TID   pantoprazole   40 mg Oral Daily   sodium chloride  flush  10-40 mL Intracatheter Q12H   Continuous Infusions:  dextrose      famotidine  (PEPCID ) IV 20 mg (01/17/24 0839)   piperacillin -tazobactam (ZOSYN )  IV 3.375 g (01/17/24 1205)    LOS: 10 days   Critical Care Procedure Note Authorized and Performed by: KYM Louder MD  Total Critical Care time:  58 mins Due to a high probability of clinically significant, life threatening deterioration, the patient required my highest level of preparedness to intervene emergently and I personally spent this critical care time directly and personally managing the patient.  This critical care time included obtaining a history; examining the patient, pulse oximetry; ordering and review of studies; arranging urgent treatment with development of a management plan; evaluation of patient's response of treatment; frequent reassessment; and discussions with other providers.  This critical care time was performed to assess and manage the high probability of imminent and life threatening deterioration that could result in multi-organ failure.  It was exclusive of separately billable procedures and treating other patients and teaching time.    Afton Louder, MD How to contact the [facility] Attending or Consulting provider 7A - 7P or covering provider during after hours 7P -7A, for this patient?  Check the care team in [facility] and look for a) attending/consulting [facility] provider listed and b) the [facility] team listed Log into [email or web address] to find provider on call.  Locate the [facility] provider you are looking for under [facility] and page to a number that you can be directly reached. If you still have difficulty reaching the provider, please page the [facility] (Director on Call) for the  Hospitalists listed on amion for assistance.  01/17/2024, 1:24 PM

## 2024-01-17 NOTE — Progress Notes (Signed)
 Limited abdominal US  of all 4 quadrants shows no free fluid.  No paracentesis was performed.  Images are available for review under imaging section of Epic.  Please call with questions or concerns.  Nicole Santos

## 2024-01-17 NOTE — Progress Notes (Signed)
 Patient had one episode of very dark emesis that smelled like blood, Dr. Vicci made aware.

## 2024-01-17 NOTE — Progress Notes (Signed)
 Patient presenting very frail this morning. Dr. Vicci made aware. Dr. Vicci placed orders for stepdown, Dressing change completed, green drainage appeared in copious amounts in umbilicus. Patient tolerated fairly.

## 2024-01-17 NOTE — Progress Notes (Signed)
Report called to [facility] in ICU

## 2024-01-18 DIAGNOSIS — K56609 Unspecified intestinal obstruction, unspecified as to partial versus complete obstruction: Secondary | ICD-10-CM | POA: Diagnosis not present

## 2024-01-18 DIAGNOSIS — C482 Malignant neoplasm of peritoneum, unspecified: Secondary | ICD-10-CM | POA: Diagnosis not present

## 2024-01-18 DIAGNOSIS — R52 Pain, unspecified: Secondary | ICD-10-CM | POA: Diagnosis not present

## 2024-01-18 DIAGNOSIS — I10 Essential (primary) hypertension: Secondary | ICD-10-CM | POA: Diagnosis not present

## 2024-01-18 LAB — GLUCOSE, CAPILLARY
Glucose-Capillary: 112 mg/dL — ABNORMAL HIGH (ref 70–99)
Glucose-Capillary: 113 mg/dL — ABNORMAL HIGH (ref 70–99)
Glucose-Capillary: 114 mg/dL — ABNORMAL HIGH (ref 70–99)
Glucose-Capillary: 114 mg/dL — ABNORMAL HIGH (ref 70–99)
Glucose-Capillary: 116 mg/dL — ABNORMAL HIGH (ref 70–99)
Glucose-Capillary: 118 mg/dL — ABNORMAL HIGH (ref 70–99)
Glucose-Capillary: 120 mg/dL — ABNORMAL HIGH (ref 70–99)

## 2024-01-18 LAB — CBC
HCT: 37.8 % (ref 36.0–46.0)
Hemoglobin: 12.2 g/dL (ref 12.0–15.0)
MCH: 33.2 pg (ref 26.0–34.0)
MCHC: 32.3 g/dL (ref 30.0–36.0)
MCV: 102.7 fL — ABNORMAL HIGH (ref 80.0–100.0)
Platelets: 43 K/uL — ABNORMAL LOW (ref 150–400)
RBC: 3.68 MIL/uL — ABNORMAL LOW (ref 3.87–5.11)
RDW: 25.9 % — ABNORMAL HIGH (ref 11.5–15.5)
WBC: 7.9 K/uL (ref 4.0–10.5)
nRBC: 0.6 % — ABNORMAL HIGH (ref 0.0–0.2)

## 2024-01-18 LAB — COMPREHENSIVE METABOLIC PANEL WITH GFR
ALT: 28 U/L (ref 0–44)
AST: 39 U/L (ref 15–41)
Albumin: 1.6 g/dL — ABNORMAL LOW (ref 3.5–5.0)
Alkaline Phosphatase: 87 U/L (ref 38–126)
Anion gap: 11 (ref 5–15)
BUN: 12 mg/dL (ref 6–20)
CO2: 24 mmol/L (ref 22–32)
Calcium: 7.7 mg/dL — ABNORMAL LOW (ref 8.9–10.3)
Chloride: 96 mmol/L — ABNORMAL LOW (ref 98–111)
Creatinine, Ser: 0.78 mg/dL (ref 0.44–1.00)
GFR, Estimated: >60 mL/min (ref >60)
Glucose, Bld: 114 mg/dL — ABNORMAL HIGH (ref 70–99)
Potassium: 3.3 mmol/L — ABNORMAL LOW (ref 3.5–5.1)
Sodium: 131 mmol/L — ABNORMAL LOW (ref 135–145)
Total Bilirubin: 1.8 mg/dL — ABNORMAL HIGH (ref 0.0–1.2)
Total Protein: 5.6 g/dL — ABNORMAL LOW (ref 6.5–8.1)

## 2024-01-18 LAB — MAGNESIUM: Magnesium: 2.2 mg/dL (ref 1.7–2.4)

## 2024-01-18 MED ORDER — POTASSIUM CHLORIDE 10 MEQ/100ML IV SOLN
10.0000 meq | INTRAVENOUS | Status: AC
  Administered 2024-01-18 (×4): 10 meq via INTRAVENOUS
  Filled 2024-01-18 (×4): qty 100

## 2024-01-18 NOTE — Progress Notes (Addendum)
 Patient screened as moderate suicide risk. Dr. Vicci made aware.

## 2024-01-18 NOTE — Plan of Care (Signed)
  Problem: Clinical Measurements: Goal: Respiratory complications will improve Outcome: Progressing Goal: Cardiovascular complication will be avoided Outcome: Progressing   Problem: Nutrition: Goal: Adequate nutrition will be maintained Outcome: Not Progressing   Problem: Pain Managment: Goal: General experience of comfort will improve and/or be controlled Outcome: Progressing

## 2024-01-18 NOTE — Plan of Care (Signed)
  Problem: Education: Goal: Knowledge of General Education information will improve Description: Including pain rating scale, medication(s)/side effects and non-pharmacologic comfort measures Outcome: Progressing   Problem: Health Behavior/Discharge Planning: Goal: Ability to manage health-related needs will improve Outcome: Progressing   Problem: Clinical Measurements: Goal: Ability to maintain clinical measurements within normal limits will improve Outcome: Progressing Goal: Will remain free from infection Outcome: Progressing Goal: Diagnostic test results will improve Outcome: Progressing Goal: Respiratory complications will improve Outcome: Progressing Goal: Cardiovascular complication will be avoided Outcome: Progressing   Problem: Elimination: Goal: Will not experience complications related to bowel motility Outcome: Progressing Goal: Will not experience complications related to urinary retention Outcome: Progressing   Problem: Pain Managment: Goal: General experience of comfort will improve and/or be controlled Outcome: Progressing   Problem: Safety: Goal: Ability to remain free from injury will improve Outcome: Progressing   Problem: Skin Integrity: Goal: Risk for impaired skin integrity will decrease Outcome: Progressing

## 2024-01-18 NOTE — Progress Notes (Signed)
 PROGRESS NOTE   Nicole Santos  FMW:[id number] DOB: 04/12/1971 DOA: 01/06/2024 PCP: Jolinda Norene HERO, DO   Chief Complaint  Patient presents with   Abdominal Pain   Level of care: Stepdown  Brief Admission History:  53 y.o. female with medical history significant for ovarian cancer, peritoneal carcinomatosis, pulmonary embolism, hypertension.  Patient presented to the ED with complaints of worsening of her chronic abdominal pain that started 3 days ago, with vomiting.  She has been compliant with soft diet as recommended by her providers, but she reports persistent pain with oral intake.   Last hospitalization 11/04/2023 through 11/07/2023 with partial small bowel obstruction-managed conservatively   ED Course: Initial tachycardia heart rate up to 127 now improved. CT AP with contrast-suggest small bowel obstruction.  EDP talked to general surgeon Dr. Evonnie, will see in consult, can hold NG tube as patient has not had any vomiting in 2 days.   01/07/2024 status post exploratory laparotomy, lysis of adhesion, small bowel resection,  ileostomy creation the    Assessment and Plan:  Small bowel obstruction Recurrent small bowel obstruction- last hospitalization 11/04/2023 through 11/07/2023 with partial small bowel obstruction-managed conservatively.  History of ovarian cancer with peritoneal carcinomatosis. -CTAP WC today-multiple persistent fluid-filled dilated small bowel loops, slight interval worsening.  Distal obstruction with transition point likely over the midline pelvis.  Also findings consistent with carcinomatosis. (See detailed report) - Dr. Evonnie, consulting - CT abd/pelvis ordered to rule out abscess -- findings concerning for abscess vs ascites with infection - holding apixaban  for now, IV heparin  on hold now due to thrombocytopenia   - Morphine  2 mg every 4 hourly as needed - Zofran  as needed - IR consulted regarding drain -- feels not enough collection for drain placement,  US  paracentesis ordered for evaluation of fluid to rule out infection, etc. IR reviewed US  and not enough fluid in the 4 quadrants for paracentesis.   -- now having diarrhea, added antidiarrheals as needed for symptoms -- pt remains on Zosyn  IV per surgeon and plan is to obtain a wound culture at next dressing change, wound Vac placed by surgeon on 8/15.   Pulmonary embolism Reports compliance with Eliquis .  Hold Eliquis  due to thrombocytopenia. -IV heparin  infusion had to be stopped due to thrombocytopenia (platelets 43)   Primary peritoneal carcinomatosis  [facility] Rogers[facility].  S/p 3 cycles of adj platinum-based therapy after surgery, currently on olaparib , her maintenance bevacizumab  is being held per notes. -- pt continues to decline, I called for another more urgent goals of care discussion on 8/15 with palliative team as I worry that she is dying and want to unburden her from unnecessary pain and suffering especially if CPR is required and intubation which I fear will only increase her suffering when the cancer is incurable and advancing  Essential hypertension Stable.  Not on antihypertensives  Hypoglycemia with hypoglycemic unawareness  -- exacerbated by poor oral intake -- not on any antihyperglycemic agents -- CBG every 4 hours -- encourage oral intake -- concerning that this has been occurring more frequently than we realize -- pt still not eating/drinking well and now remains dependent on continuous D10 infusion to avoid recurrent hypoglycemia   DVT prophylaxis: IV heparin  Code Status: Full  Family Communication: husband updated at bedside  Disposition: TBD    Consultants:  surgery  Procedures:   Antimicrobials:  Zosyn  8/13>>   Subjective: Pt continues to have no appetite, she is drinking fluids, having  abdominal pain/discomfort   Objective: Vitals:   01/18/24 0300 01/18/24 0400 01/18/24 0425 01/18/24 0758  BP: 106/78 115/77     Pulse: 96 (!) 105    Resp: 18 (!) 22    Temp:   97.6 F (36.4 C) 98 F (36.7 C)  TempSrc:   Oral Oral  SpO2: 97% 99%    Weight:      Height:        Intake/Output Summary (Last 24 hours) at 01/18/2024 1111 Last data filed at 01/18/2024 9187 Gross per 24 hour  Intake 1124.4 ml  Output 875 ml  Net 249.4 ml   Filed Weights   01/06/24 1313 01/06/24 1924 01/07/24 1359  Weight: 80.6 kg 79.9 kg 79.9 kg   Examination:  General exam: appears chronically ill and decompensated.  Arousable. Uncomfortable.   Respiratory system: shallow breathing bilateral. Cardiovascular system: normal S1 & S2 heard. No JVD, murmurs, rubs, gallops or clicks. 1+ pedal edema. Gastrointestinal system: Abdomen is distended, very tender.  Copious brown drainage seen from incision.  No organomegaly or masses felt. hypoactive bowel sounds heard. Central nervous system: Alert and oriented. No focal neurological deficits. Extremities: Symmetric 5 x 5 power. Skin: No rashes, lesions or ulcers. Psychiatry: Judgement and insight appear normal. Mood & affect appropriate.   Data Reviewed: I have personally reviewed following labs and imaging studies  CBC: Recent Labs  Lab 01/14/24 0430 01/15/24 0513 01/16/24 0504 01/17/24 0205 01/18/24 0416  WBC 8.1 9.5 11.1* 8.5 7.9  HGB 11.4* 11.5* 11.8* 11.3* 12.2  HCT 34.1* 35.0* 35.9* 35.0* 37.8  MCV 100.9* 102.0* 102.3* 102.3* 102.7*  PLT 59* 67* 59* 46* 43*    Basic Metabolic Panel: Recent Labs  Lab 01/12/24 0322 01/13/24 0354 01/14/24 0430 01/15/24 0513 01/16/24 0504 01/17/24 0205 01/18/24 0416  NA 135   < > 132* 133* 131* 131* 131*  K 2.7*   < > 3.9 4.8 3.3* 3.5 3.3*  CL 96*   < > 102 100 97* 94* 96*  CO2 28   < > 22 22 21* 24 24  GLUCOSE 139*   < > 62* 48* 121* 141* 114*  BUN 17   < > 11 15 16 15 12   CREATININE 0.68   < > 0.55 0.73 0.78 0.86 0.78  CALCIUM  7.6*   < > 7.8* 8.0* 7.8* 7.4* 7.7*  MG 1.3*  --   --   --   --   --  2.2   < > = values in  this interval not displayed.    CBG: Recent Labs  Lab 01/17/24 1604 01/17/24 2017 01/18/24 0024 01/18/24 0402 01/18/24 0728  GLUCAP 141* 116* 120* 112* 113*    Recent Results (from the past 240 hours)  MRSA Next Gen by PCR, Nasal     Status: None   Collection Time: 01/17/24 10:18 AM   Specimen: Nasal Mucosa; Nasal Swab  Result Value Ref Range Status   MRSA by PCR Next Gen NOT DETECTED NOT DETECTED Final    Comment: (NOTE) The GeneXpert MRSA Assay (FDA approved for NASAL specimens only), is one component of a comprehensive MRSA colonization surveillance program. It is not intended to diagnose MRSA infection nor to guide or monitor treatment for MRSA infections. Test performance is not FDA approved in patients less than 28 years old. Performed at [facility], [address] [place], [name] [address]      Radiology Studies: US  ASCITES (ABDOMEN LIMITED) Result Date: 01/17/2024 CLINICAL DATA:  [id number] Infected  ascites [id number] Patient with concern for malignant small bowel obstruction s/p exploratory laparotomy, lysis of adhesions, small bowel resection and creation of end ileostomy 01/07/24. Patient recently noted to have purulent drainage from midline incision and CT abd/pelvis shows enhancing peritoneal fluid in bilateral pericolic gutters concerning for infection. Request for possible paracentesis. EXAM: LIMITED ABDOMEN ULTRASOUND FOR ASCITES TECHNIQUE: Limited ultrasound survey for ascites was performed in all four abdominal quadrants. COMPARISON:  CT abdomen pelvis with contrast 01/15/2024 FINDINGS: Limited abdominal ultrasound of all 4 quadrants and midline was performed at bedside. No free fluid seen. IMPRESSION: No free fluid within the imaged abdomen. Paracentesis was NOT performed. Electronically Signed   By: Thom Hall M.D.   On: 01/17/2024 10:41    Scheduled Meds:  acetaminophen   1,000 mg Oral Q8H   atorvastatin   20 mg Oral QHS   Chlorhexidine  Gluconate Cloth  6 each  Topical Daily   DULoxetine   60 mg Oral Daily   feeding supplement  1 Container Oral TID BM   ferrous sulfate   325 mg Oral Q breakfast   loperamide   2 mg Oral Daily   megestrol   400 mg Oral Daily   methocarbamol   500 mg Oral TID   pantoprazole   40 mg Oral Daily   sodium chloride  flush  10-40 mL Intracatheter Q12H   Continuous Infusions:  dextrose  40 mL/hr at 01/18/24 9187   famotidine  (PEPCID ) IV Stopped (01/17/24 2120)   piperacillin -tazobactam (ZOSYN )  IV Stopped (01/18/24 0727)   potassium chloride  10 mEq (01/18/24 1031)    LOS: 11 days   Critical Care Procedure Note Authorized and Performed by: KYM Louder MD  Total Critical Care time:  55 mins Due to a high probability of clinically significant, life threatening deterioration, the patient required my highest level of preparedness to intervene emergently and I personally spent this critical care time directly and personally managing the patient.  This critical care time included obtaining a history; examining the patient, pulse oximetry; ordering and review of studies; arranging urgent treatment with development of a management plan; evaluation of patient's response of treatment; frequent reassessment; and discussions with other providers.  This critical care time was performed to assess and manage the high probability of imminent and life threatening deterioration that could result in multi-organ failure.  It was exclusive of separately billable procedures and treating other patients and teaching time.   Afton Louder, MD How to contact the [facility] Attending or Consulting provider 7A - 7P or covering provider during after hours 7P -7A, for this patient?  Check the care team in [facility] and look for a) attending/consulting [facility] provider listed and b) the [facility] team listed Log into [email or web address] to find provider on call.  Locate the [facility] provider you are looking for under [facility] and page to a number that you can be directly reached. If  you still have difficulty reaching the provider, please page the [facility] (Director on Call) for the Hospitalists listed on amion for assistance.  01/18/2024, 11:11 AM

## 2024-01-18 NOTE — Progress Notes (Signed)
 [facility]  Patient with vac in place, suction working but alarm for blockage does occur at times. Fluid in canister is about 200cc.    BP 110/68   Pulse (!) 102   Temp 98 F (36.7 C) (Oral)   Resp (!) 22   Ht 5' 2 (1.575 m)   Wt 79.9 kg   SpO2 99%   BMI 32.22 kg/m  Soft, vac in place, ostomy with output  Patient with peritoneal carcinomatosis and what appears to be malignant ascites, wound vac in place. It is working and controlling drainage but blockage alarm is going off. No supplies to redress today, will get for potential change tomorrow if continues to have this alarm. It is staying sealed for now.   Diet as tolerated Palliative has seen Encouraged patient to let us  know her goals /thoughts She seems very depressed   Manuelita Pander, MD [facility] [address] [place], [name] [address] [phone] (office)

## 2024-01-18 NOTE — Progress Notes (Signed)
 Patients port is accessed and infusing but does not have blood return. Site is clean, dry with no redness or irritation. Patient states there is no pain at site. Dr. Vicci made aware.

## 2024-01-19 DIAGNOSIS — C482 Malignant neoplasm of peritoneum, unspecified: Secondary | ICD-10-CM | POA: Diagnosis not present

## 2024-01-19 DIAGNOSIS — I10 Essential (primary) hypertension: Secondary | ICD-10-CM | POA: Diagnosis not present

## 2024-01-19 DIAGNOSIS — K56609 Unspecified intestinal obstruction, unspecified as to partial versus complete obstruction: Secondary | ICD-10-CM | POA: Diagnosis not present

## 2024-01-19 DIAGNOSIS — I2782 Chronic pulmonary embolism: Secondary | ICD-10-CM | POA: Diagnosis not present

## 2024-01-19 LAB — GLUCOSE, CAPILLARY
Glucose-Capillary: 109 mg/dL — ABNORMAL HIGH (ref 70–99)
Glucose-Capillary: 110 mg/dL — ABNORMAL HIGH (ref 70–99)
Glucose-Capillary: 110 mg/dL — ABNORMAL HIGH (ref 70–99)
Glucose-Capillary: 111 mg/dL — ABNORMAL HIGH (ref 70–99)
Glucose-Capillary: 116 mg/dL — ABNORMAL HIGH (ref 70–99)
Glucose-Capillary: 96 mg/dL (ref 70–99)

## 2024-01-19 LAB — COMPREHENSIVE METABOLIC PANEL WITH GFR
ALT: 21 U/L (ref 0–44)
AST: 35 U/L (ref 15–41)
Albumin: <1.5 g/dL — ABNORMAL LOW (ref 3.5–5.0)
Alkaline Phosphatase: 82 U/L (ref 38–126)
Anion gap: 10 (ref 5–15)
BUN: 9 mg/dL (ref 6–20)
CO2: 25 mmol/L (ref 22–32)
Calcium: 7.8 mg/dL — ABNORMAL LOW (ref 8.9–10.3)
Chloride: 96 mmol/L — ABNORMAL LOW (ref 98–111)
Creatinine, Ser: 0.69 mg/dL (ref 0.44–1.00)
GFR, Estimated: >60 mL/min (ref >60)
Glucose, Bld: 115 mg/dL — ABNORMAL HIGH (ref 70–99)
Potassium: 5 mmol/L (ref 3.5–5.1)
Sodium: 131 mmol/L — ABNORMAL LOW (ref 135–145)
Total Bilirubin: 1.6 mg/dL — ABNORMAL HIGH (ref 0.0–1.2)
Total Protein: 5.2 g/dL — ABNORMAL LOW (ref 6.5–8.1)

## 2024-01-19 LAB — CBC
HCT: 34.6 % — ABNORMAL LOW (ref 36.0–46.0)
Hemoglobin: 11.2 g/dL — ABNORMAL LOW (ref 12.0–15.0)
MCH: 33.7 pg (ref 26.0–34.0)
MCHC: 32.4 g/dL (ref 30.0–36.0)
MCV: 104.2 fL — ABNORMAL HIGH (ref 80.0–100.0)
Platelets: 38 K/uL — ABNORMAL LOW (ref 150–400)
RBC: 3.32 MIL/uL — ABNORMAL LOW (ref 3.87–5.11)
RDW: 25.8 % — ABNORMAL HIGH (ref 11.5–15.5)
WBC: 6.3 K/uL (ref 4.0–10.5)
nRBC: 1 % — ABNORMAL HIGH (ref 0.0–0.2)

## 2024-01-19 MED ORDER — DEXTROSE 10 % IV SOLN: INTRAVENOUS | Status: AC

## 2024-01-19 MED ORDER — ORAL CARE MOUTH RINSE
15.0000 mL | OROMUCOSAL | Status: DC | PRN
Start: 2024-01-19 — End: 2024-01-23

## 2024-01-19 NOTE — Progress Notes (Signed)
 [facility]  Patient without much to say.  Output now filled canister.   BP 118/81   Pulse (!) 106   Temp (!) 97.5 F (36.4 C) (Oral)   Resp (!) 21   Ht 5' 2 (1.575 m)   Wt 79.9 kg   SpO2 96%   BMI 32.22 kg/m  Ostomy has pulled apart from skin superficially on the inferior edge, 50%, the white and black sponge were removed from midline, culture obtained deep, I replaced the white and black sponge and vac, ostomy appliance replaced  Patient with peritoneal carcinomatosis and what appears to be malignant ascites, wound vac in place. Vac and ostomy changed today. Would not change again tomorrow unless leaking.     Diet as tolerated Palliative has seen Encouraged patient to let us  know her goals /thoughts She seems very depressed   Manuelita Pander, MD [facility] [address] [place], [name] [address] [phone] (office)

## 2024-01-19 NOTE — Plan of Care (Signed)
  Problem: Elimination: Goal: Will not experience complications related to urinary retention Outcome: Progressing   Problem: Pain Managment: Goal: General experience of comfort will improve and/or be controlled Outcome: Progressing   Problem: Activity: Goal: Risk for activity intolerance will decrease Outcome: Not Progressing   Problem: Nutrition: Goal: Adequate nutrition will be maintained Outcome: Not Progressing

## 2024-01-19 NOTE — Progress Notes (Addendum)
 PROGRESS NOTE   Nicole Santos  FMW:[id number] DOB: 12/17/1970 DOA: 01/06/2024 PCP: Jolinda Norene HERO, DO   Chief Complaint  Patient presents with   Abdominal Pain   Level of care: Stepdown  Brief Admission History:  53 y.o. female with medical history significant for ovarian cancer, peritoneal carcinomatosis, pulmonary embolism, hypertension.  Patient presented to the ED with complaints of worsening of her chronic abdominal pain that started 3 days ago, with vomiting.  She has been compliant with soft diet as recommended by her providers, but she reports persistent pain with oral intake.   Last hospitalization 11/04/2023 through 11/07/2023 with partial small bowel obstruction-managed conservatively   ED Course: Initial tachycardia heart rate up to 127 now improved. CT AP with contrast-suggest small bowel obstruction.  EDP talked to general surgeon Dr. Evonnie, will see in consult, can hold NG tube as patient has not had any vomiting in 2 days.   01/07/2024 status post exploratory laparotomy, lysis of adhesion, small bowel resection,  ileostomy creation    Assessment and Plan:  Small bowel obstruction Recurrent small bowel obstruction- last hospitalization 11/04/2023 through 11/07/2023 with partial small bowel obstruction-managed conservatively.  History of ovarian cancer with peritoneal carcinomatosis. -CTAP WC today-multiple persistent fluid-filled dilated small bowel loops, slight interval worsening.  Distal obstruction with transition point likely over the midline pelvis.  Also findings consistent with carcinomatosis. (See detailed report) - Dr. Evonnie, consulting - CT abd/pelvis ordered to rule out abscess -- findings concerning for abscess vs ascites with infection - holding apixaban  for now, IV heparin  on hold now due to thrombocytopenia   - Morphine  2 mg every 4 hourly as needed - Zofran  as needed - IR consulted regarding drain -- feels not enough collection for drain placement, US   paracentesis ordered for evaluation of fluid to rule out infection, etc. IR reviewed US  and not enough fluid in the 4 quadrants for paracentesis.   -- now having diarrhea, added antidiarrheals as needed for symptoms -- pt remains on Zosyn  IV per surgeon and plan is to obtain a wound culture at next dressing change, wound Vac placed by surgeon on 8/15.   Pulmonary embolism Reports compliance with Eliquis .  Hold Eliquis  due to thrombocytopenia. -IV heparin  infusion had to be stopped due to thrombocytopenia (platelets 38)   Primary peritoneal carcinomatosis  [facility] Rogers[facility].  S/p 3 cycles of adj platinum-based therapy after surgery, currently on olaparib , her maintenance bevacizumab  is being held per notes. -- pt continues to decline, I called for another more urgent goals of care discussion on 8/15 with palliative team as I worry that she is dying and want to unburden her from unnecessary pain and suffering especially if CPR is required and intubation which I fear will only increase her suffering when the cancer is incurable and advancing -- considering LTAC if patient wanting to continue full scope treatment   Essential hypertension Stable.  Not on antihypertensives  Hypoglycemia with hypoglycemic unawareness  -- exacerbated by poor oral intake -- not on any antihyperglycemic agents -- CBG every 4 hours -- encourage oral intake -- concerning that this has been occurring more frequently than we realize -- pt still not eating/drinking well and now remains dependent on continuous D10 infusion to avoid recurrent hypoglycemia  -- reducing rate of D10 infusion with goal of discontinuing tomorrow   DVT prophylaxis: IV heparin  Code Status: Full  Family Communication: husband updated at bedside  Disposition: considering LTAC if pt wants to  continue full scope treatments    Consultants:  surgery  Procedures:  Wound vac placement  Antimicrobials:   Zosyn  8/13>>   Subjective: Pt eating and drinking a tiny bit more. Still wanting to keep going with full scope treatments.    Objective: Vitals:   01/19/24 0200 01/19/24 0300 01/19/24 0409 01/19/24 0745  BP: 120/77 112/70    Pulse: 100 (!) 102    Resp: 20 (!) 21    Temp:   98 F (36.7 C) (!) 97.5 F (36.4 C)  TempSrc:   Axillary Oral  SpO2: 96% 97%    Weight:      Height:        Intake/Output Summary (Last 24 hours) at 01/19/2024 1155 Last data filed at 01/19/2024 9062 Gross per 24 hour  Intake 1372 ml  Output 1075 ml  Net 297 ml   Filed Weights   01/06/24 1313 01/06/24 1924 01/07/24 1359  Weight: 80.6 kg 79.9 kg 79.9 kg   Examination:  General exam: appears chronically ill and decompensated.  Arousable. Uncomfortable.   Respiratory system: shallow breathing bilateral. Cardiovascular system: normal S1 & S2 heard. No JVD, murmurs, rubs, gallops or clicks. 1+ pedal edema. Gastrointestinal system: Abdomen is distended, very tender.  Copious brown drainage seen from incision.  No organomegaly or masses felt. hypoactive bowel sounds heard. Central nervous system: Alert and oriented. No focal neurological deficits. Extremities: Symmetric 5 x 5 power. Skin: No rashes, lesions or ulcers. Psychiatry: Judgement and insight appear normal. Mood & affect appropriate.   Data Reviewed: I have personally reviewed following labs and imaging studies  CBC: Recent Labs  Lab 01/15/24 0513 01/16/24 0504 01/17/24 0205 01/18/24 0416 01/19/24 0609  WBC 9.5 11.1* 8.5 7.9 6.3  HGB 11.5* 11.8* 11.3* 12.2 11.2*  HCT 35.0* 35.9* 35.0* 37.8 34.6*  MCV 102.0* 102.3* 102.3* 102.7* 104.2*  PLT 67* 59* 46* 43* 38*    Basic Metabolic Panel: Recent Labs  Lab 01/15/24 0513 01/16/24 0504 01/17/24 0205 01/18/24 0416 01/19/24 0609  NA 133* 131* 131* 131* 131*  K 4.8 3.3* 3.5 3.3* 5.0  CL 100 97* 94* 96* 96*  CO2 22 21* 24 24 25   GLUCOSE 48* 121* 141* 114* 115*  BUN 15 16 15 12 9    CREATININE 0.73 0.78 0.86 0.78 0.69  CALCIUM  8.0* 7.8* 7.4* 7.7* 7.8*  MG  --   --   --  2.2  --     CBG: Recent Labs  Lab 01/18/24 1956 01/18/24 2335 01/19/24 0404 01/19/24 0731 01/19/24 1127  GLUCAP 114* 116* 109* 111* 110*    Recent Results (from the past 240 hours)  MRSA Next Gen by PCR, Nasal     Status: None   Collection Time: 01/17/24 10:18 AM   Specimen: Nasal Mucosa; Nasal Swab  Result Value Ref Range Status   MRSA by PCR Next Gen NOT DETECTED NOT DETECTED Final    Comment: (NOTE) The GeneXpert MRSA Assay (FDA approved for NASAL specimens only), is one component of a comprehensive MRSA colonization surveillance program. It is not intended to diagnose MRSA infection nor to guide or monitor treatment for MRSA infections. Test performance is not FDA approved in patients less than 100 years old. Performed at [facility], [address], [place], [name] [address]      Radiology Studies: No results found.   Scheduled Meds:  acetaminophen   1,000 mg Oral Q8H   atorvastatin   20 mg Oral QHS   Chlorhexidine  Gluconate Cloth  6 each  Topical Daily   DULoxetine   60 mg Oral Daily   feeding supplement  1 Container Oral TID BM   ferrous sulfate   325 mg Oral Q breakfast   loperamide   2 mg Oral Daily   megestrol   400 mg Oral Daily   methocarbamol   500 mg Oral TID   pantoprazole   40 mg Oral Daily   sodium chloride  flush  10-40 mL Intracatheter Q12H   Continuous Infusions:  dextrose  30 mL/hr at 01/19/24 0757   famotidine  (PEPCID ) IV 20 mg (01/19/24 0811)   piperacillin -tazobactam (ZOSYN )  IV 3.375 g (01/19/24 1150)    LOS: 12 days   Critical Care Procedure Note Authorized and Performed by: KYM Louder MD  Total Critical Care time:  50 mins Due to a high probability of clinically significant, life threatening deterioration, the patient required my highest level of preparedness to intervene emergently and I personally spent this critical care time directly and personally  managing the patient.  This critical care time included obtaining a history; examining the patient, pulse oximetry; ordering and review of studies; arranging urgent treatment with development of a management plan; evaluation of patient's response of treatment; frequent reassessment; and discussions with other providers.  This critical care time was performed to assess and manage the high probability of imminent and life threatening deterioration that could result in multi-organ failure.  It was exclusive of separately billable procedures and treating other patients and teaching time.   Afton Louder, MD How to contact the [facility] Attending or Consulting provider 7A - 7P or covering provider during after hours 7P -7A, for this patient?  Check the care team in [facility] and look for a) attending/consulting [facility] provider listed and b) the [facility] team listed Log into [email or web address] to find provider on call.  Locate the [facility] provider you are looking for under [facility] and page to a number that you can be directly reached. If you still have difficulty reaching the provider, please page the [facility] (Director on Call) for the Hospitalists listed on amion for assistance.  01/19/2024, 11:55 AM

## 2024-01-19 NOTE — Plan of Care (Signed)
  Problem: Nutrition: Goal: Adequate nutrition will be maintained Outcome: Progressing   Problem: Elimination: Goal: Will not experience complications related to urinary retention Outcome: Progressing   Problem: Pain Managment: Goal: General experience of comfort will improve and/or be controlled Outcome: Progressing

## 2024-01-20 DIAGNOSIS — Z7189 Other specified counseling: Secondary | ICD-10-CM | POA: Diagnosis not present

## 2024-01-20 DIAGNOSIS — K56609 Unspecified intestinal obstruction, unspecified as to partial versus complete obstruction: Secondary | ICD-10-CM | POA: Diagnosis not present

## 2024-01-20 DIAGNOSIS — Z515 Encounter for palliative care: Secondary | ICD-10-CM | POA: Diagnosis not present

## 2024-01-20 DIAGNOSIS — R52 Pain, unspecified: Secondary | ICD-10-CM | POA: Diagnosis not present

## 2024-01-20 LAB — COMPREHENSIVE METABOLIC PANEL WITH GFR
ALT: 18 U/L (ref 0–44)
AST: 39 U/L (ref 15–41)
Albumin: <1.5 g/dL — ABNORMAL LOW (ref 3.5–5.0)
Alkaline Phosphatase: 78 U/L (ref 38–126)
Anion gap: 9 (ref 5–15)
BUN: 9 mg/dL (ref 6–20)
CO2: 25 mmol/L (ref 22–32)
Calcium: 7.6 mg/dL — ABNORMAL LOW (ref 8.9–10.3)
Chloride: 94 mmol/L — ABNORMAL LOW (ref 98–111)
Creatinine, Ser: 0.6 mg/dL (ref 0.44–1.00)
GFR, Estimated: >60 mL/min (ref >60)
Glucose, Bld: 96 mg/dL (ref 70–99)
Potassium: 4.3 mmol/L (ref 3.5–5.1)
Sodium: 128 mmol/L — ABNORMAL LOW (ref 135–145)
Total Bilirubin: <0.2 mg/dL (ref 0.0–1.2)
Total Protein: 5.2 g/dL — ABNORMAL LOW (ref 6.5–8.1)

## 2024-01-20 LAB — GLUCOSE, CAPILLARY
Glucose-Capillary: 102 mg/dL — ABNORMAL HIGH (ref 70–99)
Glucose-Capillary: 109 mg/dL — ABNORMAL HIGH (ref 70–99)
Glucose-Capillary: 113 mg/dL — ABNORMAL HIGH (ref 70–99)
Glucose-Capillary: 82 mg/dL (ref 70–99)
Glucose-Capillary: 95 mg/dL (ref 70–99)

## 2024-01-20 LAB — CBC
HCT: 34.4 % — ABNORMAL LOW (ref 36.0–46.0)
Hemoglobin: 11.3 g/dL — ABNORMAL LOW (ref 12.0–15.0)
MCH: 33.8 pg (ref 26.0–34.0)
MCHC: 32.8 g/dL (ref 30.0–36.0)
MCV: 103 fL — ABNORMAL HIGH (ref 80.0–100.0)
Platelets: 31 K/uL — ABNORMAL LOW (ref 150–400)
RBC: 3.34 MIL/uL — ABNORMAL LOW (ref 3.87–5.11)
RDW: 25.5 % — ABNORMAL HIGH (ref 11.5–15.5)
WBC: 6.6 K/uL (ref 4.0–10.5)
nRBC: 1.2 % — ABNORMAL HIGH (ref 0.0–0.2)

## 2024-01-20 MED ORDER — HYDROMORPHONE HCL 1 MG/ML IJ SOLN
0.5000 mg | INTRAMUSCULAR | Status: DC | PRN
  Administered 2024-01-21 – 2024-01-22 (×3): 0.5 mg via INTRAVENOUS
  Filled 2024-01-20 (×3): qty 0.5

## 2024-01-20 NOTE — Consult Note (Addendum)
 WOC Nurse Consult Note: Reason for Consult: sacral wound  Wound type: 1 Deep Tissue Pressure Injury coccyx purple maroon discoloration  2.  Stage 2 Pressure injury L buttocks red moist  Pressure Injury POA: no  Measurement: see nursing flowsheet  Wound bed: as above  Drainage (amount, consistency, odor) see nursing flowsheet  Periwound: intact  Dressing procedure/placement/frequency:  Cleanse coccyx (purple discoloration) and buttocks wounds with NS, apply Xeroform gauze ([place] [phone]) to wound beds daily and secure with silicone foam.    POC discussed with bedside nurse.  WOC team will follow every 7 to 10 days to assess area and change POC as needed.   Thank you,    Powell Bar MSN, RN-BC, [employer]

## 2024-01-20 NOTE — Plan of Care (Signed)
°  Problem: Education: °Goal: Knowledge of General Education information will improve °Description: Including pain rating scale, medication(s)/side effects and non-pharmacologic comfort measures °Outcome: Progressing °  °Problem: Nutrition: °Goal: Adequate nutrition will be maintained °Outcome: Not Progressing °  °

## 2024-01-20 NOTE — Plan of Care (Signed)
  Problem: Clinical Measurements: Goal: Respiratory complications will improve Outcome: Progressing Goal: Cardiovascular complication will be avoided Outcome: Progressing   Problem: Nutrition: Goal: Adequate nutrition will be maintained Outcome: Not Progressing   

## 2024-01-20 NOTE — Progress Notes (Signed)
                                                                                                                                                          Daily Progress Note   Patient Name: Nicole Santos       Date: 01/20/2024 DOB: 1971/04/22  Age: 53 y.o. MRN#: [id number] Attending Physician: Vicci Afton CROME, MD Primary Care Physician: Jolinda Norene HERO, DO Admit Date: 01/06/2024  Reason for Consultation/Follow-up: {Reason for Consult:23484}  Subjective: ***  Today, ***  Chart review/care coordination:  Completed extensive chart review including EPIC notes, ***. Coordinated care with ****.    Length of Stay: 13   Physical Exam          Vital Signs: BP 107/64   Pulse (!) 101   Temp 98 F (36.7 C) (Oral)   Resp 19   Ht 5' 2 (1.575 m)   Wt 79.9 kg   SpO2 99%   BMI 32.22 kg/m  SpO2: SpO2: 99 % O2 Device: O2 Device: Room Air O2 Flow Rate:        Palliative Assessment/Data:   Palliative Care Assessment & Plan   Patient Profile/Assessment:  ***   Recommendations/Plan: ***   Symptom management:  ***  Prognosis:  {Palliative Care Prognosis:23504}    Discharge Planning: {Palliative dispostion:23505}    Detailed review of medical records (labs, imaging, vital signs), medically appropriate exam, discussed with treatment team, counseling and education to patient, family, & staff, documenting clinical information, medication management, coordination of care   Total time: I spent *** minutes in the care of the patient today in the above activities and documenting the encounter.   Billing based on MDM: ***  {Problems Addressed:304933}  {Amount and/or Complexity of Ijuj:695065}  {Risks:304936}         Laymon HERO [place], NP  Palliative Medicine Team Team phone # [phone]  Thank you for allowing the Palliative Medicine Team to assist in the care of this patient. Please utilize secure chat with additional questions, if there is no response  within 30 minutes please call the above phone number.  Palliative Medicine Team providers are available by phone from 7am to 7pm daily and can be reached through the team cell phone.  Should this patient require assistance outside of these hours, please call the patient's attending physician.

## 2024-01-20 NOTE — Consult Note (Signed)
 WOC Nurse wound follow up Wound type: dehisced midline wound, copious purulent drainage. VAC dressing and ostomy pouch changed by surgery yesterday with instructions not to change today unless leaking.  It is intact at this time.  Measurement: VAC in place Wound bed:Not able to visualize Drainage (amount, consistency, odor) copious purulent effluent  foul odor Periwound: RLQ ileostomy Dressing procedure/placement/frequency:White and black foam in wound bed.  Surgery changed dressing 01/19/24.  WOC team will see Tuesday or Wednesday if not changed by surgery team.  WOC Nurse ostomy follow up Stoma type/location:  RLQ ileostomy Stomal assessment/size:  oval, red and flush Peristomal assessment: Pouch intact and will not change today.  Was applied yesterday.  Treatment options for stomal/peristomal skin: Barrier ring to peristomal crease and around stoma.   Output liquid green  Ostomy pouching: 1pc.soft convex with barrier ring and belt.  Education provided: None  Patient is resting comfortably at this time.  Enrolled patient in [place] Secure Start Discharge program: Yes Will follow.   Darice Cooley MSN, RN, FNP-BC CWON Wound, Ostomy, Continence Nurse [facility] [phone] Pager [phone]

## 2024-01-20 NOTE — Progress Notes (Signed)
 PROGRESS NOTE   Nicole Santos  FMW:[id number] DOB: 1971/04/25 DOA: 01/06/2024 PCP: Jolinda Norene HERO, DO   Chief Complaint  Patient presents with   Abdominal Pain   Level of care: Stepdown  Brief Admission History:  53 y.o. female with medical history significant for ovarian cancer, peritoneal carcinomatosis, pulmonary embolism, hypertension.  Patient presented to the ED with complaints of worsening of her chronic abdominal pain that started 3 days ago, with vomiting.  She has been compliant with soft diet as recommended by her providers, but she reports persistent pain with oral intake.   Last hospitalization 11/04/2023 through 11/07/2023 with partial small bowel obstruction-managed conservatively   ED Course: Initial tachycardia heart rate up to 127 now improved. CT AP with contrast-suggest small bowel obstruction.  EDP talked to general surgeon Dr. Evonnie, will see in consult, can hold NG tube as patient has not had any vomiting in 2 days.   01/07/2024 status post exploratory laparotomy, lysis of adhesion, small bowel resection,  ileostomy creation    Assessment and Plan:  Small bowel obstruction Recurrent small bowel obstruction- last hospitalization 11/04/2023 through 11/07/2023 with partial small bowel obstruction-managed conservatively.  History of ovarian cancer with peritoneal carcinomatosis. -CTAP WC today-multiple persistent fluid-filled dilated small bowel loops, slight interval worsening.  Distal obstruction with transition point likely over the midline pelvis.  Also findings consistent with carcinomatosis. (See detailed report) - Dr. Tarry, consulting - CT abd/pelvis ordered to rule out abscess -- findings concerning for abscess vs ascites with infection - holding apixaban  for now, IV heparin  on hold now due to thrombocytopenia   - Morphine  2 mg every 4 hourly as needed - Zofran  as needed - IR consulted regarding drain -- feels not enough collection for drain  placement, US  paracentesis ordered for evaluation of fluid to rule out infection, etc. IR reviewed US  and not enough fluid in the 4 quadrants for paracentesis.   -- now having diarrhea, added antidiarrheals as needed for symptoms -- pt remains on Zosyn  IV per surgeon and s/p wound culture, wound Vac placed by surgeon on 8/15, dressing changed 8/17   Pulmonary embolism Reports compliance with Eliquis .  Hold Eliquis  due to thrombocytopenia. -IV heparin  infusion had to be stopped due to thrombocytopenia (platelets 38)   Primary peritoneal carcinomatosis  [facility] Rogers[facility].  S/p 3 cycles of adj platinum-based therapy after surgery, currently on olaparib , her maintenance bevacizumab  is being held per notes. -- pt continues to decline, I called for another more urgent goals of care discussion on 8/15 with palliative team as I worry that she is dying and want to unburden her from unnecessary pain and suffering especially if CPR is required and intubation which I fear will only increase her suffering when the cancer is incurable and advancing -- pt saying she wants to go home so she is considering home hospice care await discussions from palliative   Essential hypertension Stable.  Not on antihypertensives  Hypoglycemia with hypoglycemic unawareness  -- exacerbated by poor oral intake -- not on any antihyperglycemic agents -- CBG every 4 hours -- encourage oral intake -- concerning that this has been occurring more frequently than we realize -- pt still not eating/drinking well and now remains dependent on continuous D10 infusion to avoid recurrent hypoglycemia  -- reducing rate of D10 infusion with goal of discontinuing soon   DVT prophylaxis: IV heparin  Code Status: Full  Family Communication: husband updated at bedside  Disposition: considering LTAC if  pt wants to continue full scope treatments    Consultants:  surgery  Procedures:  Wound vac  placement  Antimicrobials:  Zosyn  8/13>>   Subjective: Pt only eating a minimal amount and saying she feels she is going to get better.     Objective: Vitals:   01/20/24 0900 01/20/24 1000 01/20/24 1100 01/20/24 1200  BP: 120/77 116/68 117/67 111/66  Pulse: 99 97 97 96  Resp: 19 19 18 17   Temp:      TempSrc:      SpO2: 98% 98% 99% 99%  Weight:      Height:        Intake/Output Summary (Last 24 hours) at 01/20/2024 1241 Last data filed at 01/20/2024 1200 Gross per 24 hour  Intake 944.13 ml  Output 425 ml  Net 519.13 ml   Filed Weights   01/06/24 1313 01/06/24 1924 01/07/24 1359  Weight: 80.6 kg 79.9 kg 79.9 kg   Examination:  General exam: appears chronically ill and decompensated.  Arousable. Uncomfortable.   Respiratory system: shallow breathing bilateral. Cardiovascular system: normal S1 & S2 heard. No JVD, murmurs, rubs, gallops or clicks. 1+ pedal edema. Gastrointestinal system: Abdomen is distended, very tender.  Light brown drainage seen from wound vac collection.  No organomegaly or masses felt. hypoactive bowel sounds heard. Central nervous system: Alert and oriented. No focal neurological deficits. Extremities: Symmetric 5 x 5 power. Skin: No rashes, lesions or ulcers. Psychiatry: Judgement and insight appear normal. Mood & affect appropriate.   Data Reviewed: I have personally reviewed following labs and imaging studies  CBC: Recent Labs  Lab 01/16/24 0504 01/17/24 0205 01/18/24 0416 01/19/24 0609 01/20/24 0517  WBC 11.1* 8.5 7.9 6.3 6.6  HGB 11.8* 11.3* 12.2 11.2* 11.3*  HCT 35.9* 35.0* 37.8 34.6* 34.4*  MCV 102.3* 102.3* 102.7* 104.2* 103.0*  PLT 59* 46* 43* 38* 31*    Basic Metabolic Panel: Recent Labs  Lab 01/16/24 0504 01/17/24 0205 01/18/24 0416 01/19/24 0609 01/20/24 0517  NA 131* 131* 131* 131* 128*  K 3.3* 3.5 3.3* 5.0 4.3  CL 97* 94* 96* 96* 94*  CO2 21* 24 24 25 25   GLUCOSE 121* 141* 114* 115* 96  BUN 16 15 12 9 9    CREATININE 0.78 0.86 0.78 0.69 0.60  CALCIUM  7.8* 7.4* 7.7* 7.8* 7.6*  MG  --   --  2.2  --   --     CBG: Recent Labs  Lab 01/19/24 1656 01/19/24 2009 01/19/24 2355 01/20/24 0354 01/20/24 0758  GLUCAP 96 110* 116* 82 95    Recent Results (from the past 240 hours)  MRSA Next Gen by PCR, Nasal     Status: None   Collection Time: 01/17/24 10:18 AM   Specimen: Nasal Mucosa; Nasal Swab  Result Value Ref Range Status   MRSA by PCR Next Gen NOT DETECTED NOT DETECTED Final    Comment: (NOTE) The GeneXpert MRSA Assay (FDA approved for NASAL specimens only), is one component of a comprehensive MRSA colonization surveillance program. It is not intended to diagnose MRSA infection nor to guide or monitor treatment for MRSA infections. Test performance is not FDA approved in patients less than 27 years old. Performed at [facility], [address] [place], [name] [address]     Radiology Studies: No results found.  Scheduled Meds:  acetaminophen   1,000 mg Oral Q8H   atorvastatin   20 mg Oral QHS   Chlorhexidine  Gluconate Cloth  6 each Topical Daily   DULoxetine   60 mg Oral Daily   feeding supplement  1 Container Oral TID BM   ferrous sulfate   325 mg Oral Q breakfast   loperamide   2 mg Oral Daily   megestrol   400 mg Oral Daily   methocarbamol   500 mg Oral TID   pantoprazole   40 mg Oral Daily   sodium chloride  flush  10-40 mL Intracatheter Q12H   Continuous Infusions:  dextrose  30 mL/hr at 01/20/24 1200   famotidine  (PEPCID ) IV Stopped (01/20/24 9147)   piperacillin -tazobactam (ZOSYN )  IV 12.5 mL/hr at 01/20/24 1200    LOS: 13 days   Time spent: 53 mins   Masoud Nyce Vicci, MD How to contact the [facility] Attending or Consulting provider 7A - 7P or covering provider during after hours 7P -7A, for this patient?  Check the care team in [facility] and look for a) attending/consulting TR[facility] provider listed and b) the TR[facility] team listed Log into [email or web address] to find provider on call.  Locate  the [facility] provider you are looking for under [facility] and page to a number that you can be directly reached. If you still have difficulty reaching the provider, please page the [facility] (Director on Call) for the Hospitalists listed on amion for assistance.  01/20/2024, 12:41 PM

## 2024-01-20 NOTE — Progress Notes (Signed)
 [facility]  Talked to patient and her husband and discussed that there are no further medical or surgical options for her and that we are treating the symptoms and trying to get her to eat.  She is eating poorly and not having much ostomy output. She has a midline wound with concern for malignant ascites coming from it. She is on antibiotics for this ascites.  BP 111/66 (BP Location: Left Wrist)   Pulse 96   Temp 98 F (36.7 C) (Oral)   Resp 17   Ht 5' 2 (1.575 m)   Wt 79.9 kg   SpO2 99%   BMI 32.22 kg/m  Soft, ostomy in place, minor succus in bag Wound vacuum in place at midline Staples in place above and below vac  Patient with peritoneal carcinomatosis and end ileostomy, malignant ascites and wound vac in place. Symptom management. Palliative has been following. Patient has been having issues with making decisions/ goals of care. Palliative is going to talk with them further.  Diet as tolerated Vac and ostomy care Will leave staples in another week due to the vac Vac can be changed again Tuesday/ Wednesday, white sponge/ black sponge and needs skin protectant and black sponge bridge  Manuelita Pander, MD [facility] [address] [place], [name] [address] [phone] (office)

## 2024-01-21 ENCOUNTER — Encounter: Admitting: General Surgery

## 2024-01-21 DIAGNOSIS — Z7189 Other specified counseling: Secondary | ICD-10-CM | POA: Diagnosis not present

## 2024-01-21 DIAGNOSIS — R52 Pain, unspecified: Secondary | ICD-10-CM | POA: Diagnosis not present

## 2024-01-21 DIAGNOSIS — K56609 Unspecified intestinal obstruction, unspecified as to partial versus complete obstruction: Secondary | ICD-10-CM | POA: Diagnosis not present

## 2024-01-21 DIAGNOSIS — Z558 Other problems related to education and literacy: Secondary | ICD-10-CM

## 2024-01-21 DIAGNOSIS — C482 Malignant neoplasm of peritoneum, unspecified: Secondary | ICD-10-CM | POA: Diagnosis not present

## 2024-01-21 DIAGNOSIS — Z515 Encounter for palliative care: Secondary | ICD-10-CM | POA: Diagnosis not present

## 2024-01-21 LAB — CBC
HCT: 31.8 % — ABNORMAL LOW (ref 36.0–46.0)
Hemoglobin: 10.4 g/dL — ABNORMAL LOW (ref 12.0–15.0)
MCH: 34 pg (ref 26.0–34.0)
MCHC: 32.7 g/dL (ref 30.0–36.0)
MCV: 103.9 fL — ABNORMAL HIGH (ref 80.0–100.0)
Platelets: 27 K/uL — CL (ref 150–400)
RBC: 3.06 MIL/uL — ABNORMAL LOW (ref 3.87–5.11)
RDW: 25.4 % — ABNORMAL HIGH (ref 11.5–15.5)
WBC: 7.2 K/uL (ref 4.0–10.5)
nRBC: 1.1 % — ABNORMAL HIGH (ref 0.0–0.2)

## 2024-01-21 LAB — GLUCOSE, CAPILLARY
Glucose-Capillary: 107 mg/dL — ABNORMAL HIGH (ref 70–99)
Glucose-Capillary: 95 mg/dL (ref 70–99)
Glucose-Capillary: 102 mg/dL — ABNORMAL HIGH (ref 70–99)
Glucose-Capillary: 106 mg/dL — ABNORMAL HIGH (ref 70–99)
Glucose-Capillary: 98 mg/dL (ref 70–99)

## 2024-01-21 LAB — COMPREHENSIVE METABOLIC PANEL WITH GFR
ALT: 16 U/L (ref 0–44)
AST: 42 U/L — ABNORMAL HIGH (ref 15–41)
Albumin: <1.5 g/dL — ABNORMAL LOW (ref 3.5–5.0)
Alkaline Phosphatase: 86 U/L (ref 38–126)
Anion gap: 8 (ref 5–15)
BUN: 11 mg/dL (ref 6–20)
CO2: 27 mmol/L (ref 22–32)
Calcium: 7.6 mg/dL — ABNORMAL LOW (ref 8.9–10.3)
Chloride: 95 mmol/L — ABNORMAL LOW (ref 98–111)
Creatinine, Ser: 0.62 mg/dL (ref 0.44–1.00)
GFR, Estimated: >60 mL/min (ref >60)
Glucose, Bld: 110 mg/dL — ABNORMAL HIGH (ref 70–99)
Potassium: 5.1 mmol/L (ref 3.5–5.1)
Sodium: 130 mmol/L — ABNORMAL LOW (ref 135–145)
Total Bilirubin: 1.5 mg/dL — ABNORMAL HIGH (ref 0.0–1.2)
Total Protein: 5.3 g/dL — ABNORMAL LOW (ref 6.5–8.1)

## 2024-01-21 MED ORDER — HYDROMORPHONE HCL 2 MG PO TABS
1.0000 mg | ORAL_TABLET | ORAL | Status: DC | PRN
  Administered 2024-01-21: 1 mg via ORAL
  Filled 2024-01-21: qty 1

## 2024-01-21 MED ORDER — DEXTROSE 10 % IV SOLN
INTRAVENOUS | Status: AC
  Filled 2024-01-21: qty 1000

## 2024-01-21 NOTE — Plan of Care (Signed)
  Problem: Education: Goal: Knowledge of General Education information will improve Description: Including pain rating scale, medication(s)/side effects and non-pharmacologic comfort measures Outcome: Progressing   Problem: Health Behavior/Discharge Planning: Goal: Ability to manage health-related needs will improve Outcome: Progressing   Problem: Clinical Measurements: Goal: Ability to maintain clinical measurements within normal limits will improve Outcome: Progressing Goal: Will remain free from infection Outcome: Progressing Goal: Diagnostic test results will improve Outcome: Progressing Goal: Respiratory complications will improve Outcome: Progressing Goal: Cardiovascular complication will be avoided Outcome: Progressing   Problem: Activity: Goal: Risk for activity intolerance will decrease Outcome: Progressing   Problem: Nutrition: Goal: Adequate nutrition will be maintained Outcome: Progressing   Problem: Elimination: Goal: Will not experience complications related to bowel motility Outcome: Progressing Goal: Will not experience complications related to urinary retention Outcome: Progressing   Problem: Pain Managment: Goal: General experience of comfort will improve and/or be controlled Outcome: Progressing

## 2024-01-21 NOTE — Progress Notes (Signed)
 [facility]  Patient going with home hospice. RN had to change the midline wound vac due to leaking today.   Will need ostomy care and wound vac care by Hospice. Palliative team seeing if Hospice will do the wound vac. I encouraged this as this will control the drainage/ moisture which will otherwise require 1-2 hr dressing changes.   Staples in place. Hospice can remove those 01/29/24.   Manuelita Pander, MD [facility] [address] [place], [name] [address] [phone] (office)

## 2024-01-21 NOTE — Plan of Care (Signed)
  Problem: Nutrition: Goal: Adequate nutrition will be maintained Outcome: Not Progressing   Problem: Education: Goal: Knowledge of General Education information will improve Description: Including pain rating scale, medication(s)/side effects and non-pharmacologic comfort measures Outcome: Progressing   Problem: Clinical Measurements: Goal: Respiratory complications will improve Outcome: Progressing Goal: Cardiovascular complication will be avoided Outcome: Progressing

## 2024-01-21 NOTE — Progress Notes (Signed)
 PROGRESS NOTE   Nicole Santos  FMW:[id number] DOB: 1970-11-30 DOA: 01/06/2024 PCP: Jolinda Norene HERO, DO   Chief Complaint  Patient presents with   Abdominal Pain   Level of care: Stepdown  Brief Admission History:  53 y.o. female with medical history significant for ovarian cancer, peritoneal carcinomatosis, pulmonary embolism, hypertension.  Patient presented to the ED with complaints of worsening of her chronic abdominal pain that started 3 days ago, with vomiting.  She has been compliant with soft diet as recommended by her providers, but she reports persistent pain with oral intake.   Last hospitalization 11/04/2023 through 11/07/2023 with partial small bowel obstruction-managed conservatively   ED Course: Initial tachycardia heart rate up to 127 now improved. CT AP with contrast-suggest small bowel obstruction.  EDP talked to general surgeon Dr. Evonnie, will see in consult, can hold NG tube as patient has not had any vomiting in 2 days.   01/07/2024 status post exploratory laparotomy, lysis of adhesion, small bowel resection,  ileostomy creation    Assessment and Plan:  Small bowel obstruction Recurrent small bowel obstruction- last hospitalization 11/04/2023 through 11/07/2023 with partial small bowel obstruction-managed conservatively.  History of ovarian cancer with peritoneal carcinomatosis. -CTAP WC today-multiple persistent fluid-filled dilated small bowel loops, slight interval worsening.  Distal obstruction with transition point likely over the midline pelvis.  Also findings consistent with carcinomatosis. (See detailed report) - Dr. Tarry, consulting - CT abd/pelvis ordered to rule out abscess -- findings concerning for abscess vs ascites with infection - holding apixaban  for now, IV heparin  on hold now due to thrombocytopenia   - Morphine  2 mg every 4 hourly as needed - Zofran  as needed - IR consulted regarding drain -- feels not enough collection for drain  placement, US  paracentesis ordered for evaluation of fluid to rule out infection, etc. IR reviewed US  and not enough fluid in the 4 quadrants for paracentesis.   -- now having diarrhea, added antidiarrheals as needed for symptoms -- pt remains on Zosyn  IV per surgeon and s/p wound culture, wound Vac placed by surgeon on 8/15, dressing changed 8/17   Pulmonary embolism Reports compliance with Eliquis .  Hold Eliquis  due to thrombocytopenia. -IV heparin  infusion had to be stopped due to thrombocytopenia (platelets 38)   Primary peritoneal carcinomatosis  [facility].  S/p 3 cycles of adj platinum-based therapy after surgery, currently on olaparib , her maintenance bevacizumab  is being held per notes. -- pt continues to decline, I called for another more urgent goals of care discussion on 8/15 with palliative team as I worry that she is dying and want to unburden her from unnecessary pain and suffering especially if CPR is required and intubation which I fear will only increase her suffering when the cancer is incurable and advancing -- pt saying she wants to go home so she is considering home hospice care await discussions from palliative   Essential hypertension Stable.  Not on antihypertensives  Hypoglycemia with hypoglycemic unawareness  -- exacerbated by poor oral intake -- not on any antihyperglycemic agents -- CBG every 4 hours -- encourage oral intake -- concerning that this has been occurring more frequently than we realize -- pt still not eating/drinking well and now remains dependent on continuous D10 infusion to avoid recurrent hypoglycemia  -- D10 infusion for now   DVT prophylaxis: IV heparin  Code Status: Full  Family Communication: husband updated at bedside  Disposition: home with hospice care - waiting on decision from  hospice regarding wound vac    Consultants:  surgery  Procedures:  Wound vac placement  Antimicrobials:   Zosyn  8/13>>   Subjective: Pt says she wants to go home and her family will take care of her at home, daughter and husband.      Objective: Vitals:   01/21/24 1500 01/21/24 1600 01/21/24 1629 01/21/24 1700  BP: 114/77 112/71  116/79  Pulse: 98 (!) 101  100  Resp: 15 16  17   Temp:   (!) 97.4 F (36.3 C)   TempSrc:   Oral   SpO2: 97% 97%  98%  Weight:      Height:        Intake/Output Summary (Last 24 hours) at 01/21/2024 1735 Last data filed at 01/21/2024 1552 Gross per 24 hour  Intake 939.68 ml  Output 736 ml  Net 203.68 ml   Filed Weights   01/06/24 1313 01/06/24 1924 01/07/24 1359  Weight: 80.6 kg 79.9 kg 79.9 kg   Examination:  General exam: appears chronically ill and decompensated.  Arousable. Uncomfortable.   Respiratory system: shallow breathing bilateral. Cardiovascular system: normal S1 & S2 heard. No JVD, murmurs, rubs, gallops or clicks. 1+ pedal edema. Gastrointestinal system: Abdomen is distended, very tender.  Light brown drainage seen from wound vac collection.  No organomegaly or masses felt. hypoactive bowel sounds heard. Central nervous system: Alert and oriented. No focal neurological deficits. Extremities: Symmetric 5 x 5 power. Skin: No rashes, lesions or ulcers. Psychiatry: Judgement and insight appear normal. Mood & affect appropriate.   Data Reviewed: I have personally reviewed following labs and imaging studies  CBC: Recent Labs  Lab 01/17/24 0205 01/18/24 0416 01/19/24 0609 01/20/24 0517 01/21/24 0524  WBC 8.5 7.9 6.3 6.6 7.2  HGB 11.3* 12.2 11.2* 11.3* 10.4*  HCT 35.0* 37.8 34.6* 34.4* 31.8*  MCV 102.3* 102.7* 104.2* 103.0* 103.9*  PLT 46* 43* 38* 31* 27*    Basic Metabolic Panel: Recent Labs  Lab 01/17/24 0205 01/18/24 0416 01/19/24 0609 01/20/24 0517 01/21/24 0524  NA 131* 131* 131* 128* 130*  K 3.5 3.3* 5.0 4.3 5.1  CL 94* 96* 96* 94* 95*  CO2 24 24 25 25 27   GLUCOSE 141* 114* 115* 96 110*  BUN 15 12 9 9 11    CREATININE 0.86 0.78 0.69 0.60 0.62  CALCIUM  7.4* 7.7* 7.8* 7.6* 7.6*  MG  --  2.2  --   --   --     CBG: Recent Labs  Lab 01/20/24 2335 01/21/24 0410 01/21/24 0720 01/21/24 1124 01/21/24 1633  GLUCAP 113* 107* 95 102* 106*    Recent Results (from the past 240 hours)  MRSA Next Gen by PCR, Nasal     Status: None   Collection Time: 01/17/24 10:18 AM   Specimen: Nasal Mucosa; Nasal Swab  Result Value Ref Range Status   MRSA by PCR Next Gen NOT DETECTED NOT DETECTED Final    Comment: (NOTE) The GeneXpert MRSA Assay (FDA approved for NASAL specimens only), is one component of a comprehensive MRSA colonization surveillance program. It is not intended to diagnose MRSA infection nor to guide or monitor treatment for MRSA infections. Test performance is not FDA approved in patients less than 76 years old. Performed at [facility], [address], [place], [name] [address]   Aerobic Culture w Gram Stain (superficial specimen)     Status: None (Preliminary result)   Collection Time: 01/19/24  4:17 PM   Specimen: Incision; Wound  Result Value Ref  Range Status   Specimen Description   Final    INCISION Performed at [facility], [address], [place], [name] [address]    Special Requests   Final    Immunocompromised Performed at [facility], [address], [place], [name] [address]    Gram Stain   Final    MODERATE WBC PRESENT, PREDOMINANTLY PMN ABUNDANT GRAM POSITIVE COCCI    Culture   Final    FEW GRAM NEGATIVE RODS CULTURE REINCUBATED FOR BETTER GROWTH Performed at [facility], [address] [place], [name] [address]    Report Status PENDING  Incomplete    Radiology Studies: No results found.  Scheduled Meds:  acetaminophen   1,000 mg Oral Q8H   atorvastatin   20 mg Oral QHS   Chlorhexidine  Gluconate Cloth  6 each Topical Daily   DULoxetine   60 mg Oral Daily   feeding supplement  1 Container Oral TID BM   ferrous sulfate   325 mg Oral Q breakfast    loperamide   2 mg Oral Daily   megestrol   400 mg Oral Daily   methocarbamol   500 mg Oral TID   pantoprazole   40 mg Oral Daily   sodium chloride  flush  10-40 mL Intracatheter Q12H   Continuous Infusions:  dextrose  30 mL/hr at 01/21/24 1552   famotidine  (PEPCID ) IV Stopped (01/21/24 0940)   piperacillin -tazobactam (ZOSYN )  IV Stopped (01/21/24 1544)    LOS: 14 days   Time spent: 50 mins   Paco Cislo Vicci, MD How to contact the [facility] Attending or Consulting provider 7A - 7P or covering provider during after hours 7P -7A, for this patient?  Check the care team in [facility] and look for a) attending/consulting [facility] provider listed and b) the [facility] team listed Log into [email or web address] to find provider on call.  Locate the [facility] provider you are looking for under [facility] and page to a number that you can be directly reached. If you still have difficulty reaching the provider, please page the [facility] (Director on Call) for the Hospitalists listed on amion for assistance.  01/21/2024, 5:35 PM

## 2024-01-21 NOTE — Progress Notes (Signed)
 Daily Progress Note   Patient Name: Nicole Santos       Date: 01/21/2024 DOB: 06/01/1971  Age: 53 y.o. MRN#: [id number] Attending Physician: Vicci Afton CROME, MD Primary Care Physician: Jolinda Norene HERO, DO Admit Date: 01/06/2024  Reason for Consultation/Follow-up: Establishing goals of care and Pain control  Subjective:  53 y.o. female  with past medical history of ovarian cancer (11/14/2021), peritoneal carcinomatosis, history of cervical cancer (2000), sleep apnea (not on CPAP), hypertension, depression, anxiety, history of PE, history of diabetes that resolved with weight loss admitted on 01/06/2024 with intractable nausea and vomiting.   Recurrent hospitalizations for SBO (most recent 11/2023). Managed conservatively. Symptoms improved with bowel rest.  She was discharged home.  She was followed by her GYN oncologist and medical oncologist in the interim.  Unfortunately, she had a worsening in her abdominal pain as well as some associated nausea and vomiting and was readmitted on 01/06/2024 she underwent exploratory laparotomy on 01/07/2024.  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  Surgical team unable to release any of her small bowel that was adherent to her pelvis and as such an end ileostomy was placed.  Noted to have numerous nodules throughout her abdomen and significant peritoneal carcinomatosis.  Admitted to the floor for further management and pain control.  Surgery recommends palliative referral for goals of care.   01/08/2024: Goals of care discussion completed.  Patient declines to make any changes to her advance directives at this time.  She is hopeful to hear from her oncology team about treatment options.  She also does not wish to make any changes to her advance directives without first discussing with  her entire family.  She will let us  know if she desires family meeting but declined need for 1 at this time.  We adjusted her pain regimen due to end of dose breakthrough pain.   01/09/2024: Symptom management reviewed. Symptoms stable/improving. Not interested in GOC/ACP discussion until able to speak with oncology.    01/10/2024: Followed up for ongoing goals of care and symptom management.  She continued to require IV opioids for pain control.  Nausea and GERD symptoms had improved.  She declined to update advanced directives but would consider outpatient palliative referral.  Will let me know if she decides to proceed with this.   01/14/2024: Continue to require IV opioids for pain management.  Continue to be followed by surgery and admitting team.  Goals of care and advance care planning discussion attempted.  Patient declined to make any changes to her advance directive at that time.  Agreeable to  outpatient palliative referral.   01/17/2024: GOC/ACP discussion completed x 3.  Extensive education provided regarding her concerns about patient's continued deterioration especially in the context of her terminal cancer diagnosis.  Patient did not wish to make any decisions or updates as it relates to her advance directives.  01/20/2024: Met with patient, symptom regimen adjusted for better pain control.  Attempted advance care planning/goals of care discussion.  Patient shared again her goal is to return home.  Declined to make any changes to her advance directives or any further decisions regarding her care.  Today, labs independently reviewed.  Sodium level remains mildly low but improved from yesterday.  Renal function stable.  Continues to have hypocalcemia.  Total protein and albumin levels low.  Bilirubin and AST elevated.  Consistent with levels earlier this admission.  White blood cell count within normal limits.  Remains mildly anemic with progressive decline in hemoglobin from yesterday  (current 10.4 g/dL, 88.6 g/dL on 1/81, platelet count also further dropped to 27 from 31).  Vital signs relatively stable other than occasional blood pressure in the 100s systolic.  Intermittently mildly tachycardic.  She had a total of 0.5 mg of IV Dilaudid  on 24 lower look back (this is less than required in previous days). 30 mg of Oxycodone  given on 8/18.  Patient however does report some pain and requests a dose of Dilaudid  at this time.  She states her pain is moderate to severe but is unable to further describe.  Met with patient and her husband at bedside.  Again discussed goals of care and anticipatory care planning.  Patient initially states that she just wants to be left alone.  With further clarification, she states that she wants to go home.  Husband admits that they will need help caring for her at home.  I discussed that given patient's current terminal condition and medical/symptom burden, goal to return home and be with family, and likelihood for further/progressive declines that hospice would be the most appropriate level of support. Engaged in a discussion about the philosophy of hospice care, including its goals, potential benefits, and enrollment criteria.  Patient and husband agree with plan to discharge home with hospice support.  Chart review/care coordination:  Completed extensive chart review including EPIC notes, lab results, vital signs, medication administration record. Coordinated care with surgery MD, oncology MD, admitting MD, TOC, and bedside nursing staff.   Length of Stay: 14   Physical Exam Constitutional:      General: She is not in acute distress.    Appearance: She is not toxic-appearing.     Comments: Chronically ill-appearing  Pulmonary:     Effort: Pulmonary effort is normal. No respiratory distress.  Skin:    General: Skin is warm and dry.     Coloration: Skin is pale.  Psychiatric:        Behavior: Behavior is withdrawn.             Vital Signs:  BP 105/68   Pulse (!) 112   Temp 97.9 F (36.6 C) (Oral)   Resp 18   Ht 5' 2 (1.575 m)   Wt 79.9 kg   SpO2 100%   BMI 32.22 kg/m  SpO2: SpO2: 100 % O2 Device: O2 Device: Room Air O2 Flow Rate:        Palliative Assessment/Data: 50%   Palliative Care Assessment & Plan   Patient Profile/Assessment:  53 y.o. female with past medical history of ovarian cancer (11/14/2021), peritoneal carcinomatosis, history of  cervical cancer (2000), sleep apnea (not on CPAP), hypertension, depression, anxiety, history of PE, history of diabetes that resolved with weight loss admitted on 01/06/2024 with intractable nausea and vomiting. Diagnosed with recurrent small bowel obstruction (most recently hospitalized 6/25 with the same, managed conservatively). Underwent exploratory laparotomy on 01/07/2024. 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 As such, a palliative end ileostomy was placed.  Numerous and extensive discussions have been had regarding goals of care, advance care planning, and advance directives.  Her goal is to return home with her family.  She had an acute decline in her health status which prompted transfer from floor to ICU.  She remains in ICU.  Numerous goals of care and advance care planning discussion completed given acute decline in health status, presence of terminal cancer, and poor prognosis.  Discussed that we need to begin to prepare for disposition.  Discussed that we will adjust her oral pain regimen in hopes of getting her pain under control with oral medications to help facilitate her return home.  We also discussed given patient's goal/desire to return home, terminal illness, and significant symptoms/medical burden and the benefits of hospice in this context.  Patient and husband ultimately agreed to plan of discharging home with hospice support.  Patient declines any further conversation regarding advanced  directives.   Recommendations/Plan:  Continue full code/full scope, palliative will continue advance directive discussion patient declined further discussion today Discharge home with hospice support Attempt to transition to oral pain regimen in an attempt to facilitate ultimate plan of discharge home Inpatient palliative team will continue to follow to address goals of care and symptom management   Symptom management:  Continue Tylenol  1000 mg p.o. every 8 hours Continue hydromorphone  0.5 mg IV every 2 hours as needed Continue Robaxin  500 mg p.o. every 8 hours Continue Protonix  40 mg p.o. daily Continue Zofran  4 mg every 8 hours as needed Discontinue oxycodone  10 mg every 4 hours as needed  Initiate hydromorphone  1 mg tablet every 4 hours as needed Continue Megace  for appetite support (although patient occasionally refuses)  Prognosis: Poor, less than 6 months    Discharge Planning: Home with Hospice    Detailed review of medical records (labs, imaging, vital signs), medically appropriate exam, discussed with treatment team, counseling and education to patient, family, & staff, documenting clinical information, medication management, coordination of care  Billing based on MDM: High   Problems Addressed: One acute or chronic illness or injury that poses a threat to life or bodily function  Amount and/or Complexity of Data: Category 3:Discussion of management or test interpretation with external physician/other qualified health care professional/appropriate source (not separately reported)  Risks: Parenteral controlled substances and decision to transition home with hospice support         Laymon CHRISTELLA Pinal, NP  Palliative Medicine Team Team phone # [phone]  Thank you for allowing the Palliative Medicine Team to assist in the care of this patient. Please utilize secure chat with additional questions, if there is no response within 30 minutes please call the above  phone number.  Palliative Medicine Team providers are available by phone from 7am to 7pm daily and can be reached through the team cell phone.  Should this patient require assistance outside of these hours, please call the patient's attending physician.

## 2024-01-22 DIAGNOSIS — Z7189 Other specified counseling: Secondary | ICD-10-CM | POA: Diagnosis not present

## 2024-01-22 DIAGNOSIS — R52 Pain, unspecified: Secondary | ICD-10-CM | POA: Diagnosis not present

## 2024-01-22 DIAGNOSIS — Z515 Encounter for palliative care: Secondary | ICD-10-CM | POA: Diagnosis not present

## 2024-01-22 DIAGNOSIS — K56609 Unspecified intestinal obstruction, unspecified as to partial versus complete obstruction: Secondary | ICD-10-CM | POA: Diagnosis not present

## 2024-01-22 DIAGNOSIS — C482 Malignant neoplasm of peritoneum, unspecified: Secondary | ICD-10-CM | POA: Diagnosis not present

## 2024-01-22 LAB — GLUCOSE, CAPILLARY
Glucose-Capillary: 104 mg/dL — ABNORMAL HIGH (ref 70–99)
Glucose-Capillary: 106 mg/dL — ABNORMAL HIGH (ref 70–99)
Glucose-Capillary: 91 mg/dL (ref 70–99)
Glucose-Capillary: 93 mg/dL (ref 70–99)
Glucose-Capillary: 93 mg/dL (ref 70–99)
Glucose-Capillary: 94 mg/dL (ref 70–99)
Glucose-Capillary: 95 mg/dL (ref 70–99)

## 2024-01-22 MED ORDER — MEGESTROL ACETATE 400 MG/10ML PO SUSP
400.0000 mg | Freq: Every day | ORAL | 1 refills | Status: DC
Start: 2024-01-23 — End: 2024-03-19

## 2024-01-22 MED ORDER — HYDROMORPHONE HCL 2 MG PO TABS
1.0000 mg | ORAL_TABLET | ORAL | 0 refills | Status: DC | PRN
Start: 2024-01-22 — End: 2024-03-19

## 2024-01-22 MED ORDER — FERROUS SULFATE 325 (65 FE) MG PO TABS: 325.0000 mg | ORAL_TABLET | Freq: Every day | ORAL | Status: DC

## 2024-01-22 MED ORDER — METHOCARBAMOL 500 MG PO TABS: 500.0000 mg | ORAL_TABLET | Freq: Three times a day (TID) | ORAL | 0 refills | Status: DC

## 2024-01-22 MED ORDER — OXYCODONE HCL 5 MG PO TABS: 5.0000 mg | ORAL_TABLET | ORAL | Status: DC | PRN

## 2024-01-22 MED ORDER — CIPROFLOXACIN HCL 250 MG PO TABS
500.0000 mg | ORAL_TABLET | Freq: Two times a day (BID) | ORAL | Status: DC
  Administered 2024-01-22 – 2024-01-23 (×3): 500 mg via ORAL
  Filled 2024-01-22 (×3): qty 2

## 2024-01-22 MED ORDER — HEPARIN SOD (PORK) LOCK FLUSH 100 UNIT/ML IV SOLN: 500.0000 [IU] | Freq: Once | INTRAVENOUS | Status: DC

## 2024-01-22 MED ORDER — CIPROFLOXACIN HCL 500 MG PO TABS: 500.0000 mg | ORAL_TABLET | Freq: Two times a day (BID) | ORAL | 0 refills | Status: DC

## 2024-01-22 MED ORDER — OXYCODONE HCL 5 MG PO TABS: 5.0000 mg | ORAL_TABLET | ORAL | 0 refills | Status: DC | PRN

## 2024-01-22 MED ORDER — HEPARIN SOD (PORK) LOCK FLUSH 100 UNIT/ML IV SOLN
500.0000 [IU] | Freq: Once | INTRAVENOUS | Status: AC
  Administered 2024-01-23: 500 [IU] via INTRAVENOUS
  Filled 2024-01-22: qty 5

## 2024-01-22 MED ORDER — ACETAMINOPHEN 500 MG PO TABS: 1000.0000 mg | ORAL_TABLET | Freq: Three times a day (TID) | ORAL | Status: DC

## 2024-01-22 MED ORDER — HYDROMORPHONE HCL 2 MG PO TABS
1.0000 mg | ORAL_TABLET | ORAL | Status: DC | PRN
  Administered 2024-01-22 – 2024-01-23 (×3): 1 mg via ORAL
  Filled 2024-01-22 (×3): qty 1

## 2024-01-22 NOTE — Discharge Summary (Addendum)
 Physician Discharge Summary   Patient: Nicole Santos MRN: [id number] DOB: 03-08-1971  Admit date:     01/06/2024  Discharge date: 01/22/24  Discharge Physician: Alm Kelita Wallis   PCP: Jolinda Norene HERO, DO   Recommendations at discharge:   Please follow up with primary care provider within 1-2 weeks  Please repeat BMP and CBC in one week    Hospital Course: 53 y.o. female with medical history significant for ovarian cancer, peritoneal carcinomatosis, pulmonary embolism, hypertension.  Patient presented to the ED with complaints of worsening of her chronic abdominal pain that started 3 days ago, with vomiting.  She has been compliant with soft diet as recommended by her providers, but she reports persistent pain with oral intake.   Last hospitalization 11/04/2023 through 11/07/2023 with partial small bowel obstruction-managed conservatively   ED Course: Initial tachycardia heart rate up to 127 now improved. CT AP with contrast-suggest small bowel obstruction.  EDP talked to general surgeon Dr. Evonnie, will see in consult, can hold NG tube as patient has not had any vomiting in 2 days.   01/07/2024 status post exploratory laparotomy, lysis of adhesion, small bowel resection,  ileostomy creation   Her postoperative course was characterized by slow improvement.  General surgery continue to follow for postoperative care.  Repeat CT of the abdomen pelvis on 01/15/2024 showed small bowel loops leading to the ostomy with some wall thickening and inflammation.  The small bowel proximal to this was dilated concerning for SBO.  There was new enhancing peritoneal fluid collections in the paracolic gutters.  The patient was started on Zosyn . She remained afebrile and hemodynamically stable.  She continued to have drainage from her midline wound.  Her wound VAC was placed and managed by general surgery.  She continued to have decreased oral intake.  She was started on Megace  with minimal improvement.  Her diet was  advanced.  Although her oral intake was minimal, she did not have any emesis.  She did have output from her ostomy suggesting she was not clinically obstructed.  She had intermittent hypoglycemia for which she required D10 drip.  Her CBGs gradually improved as her oral intake somewhat improved.  Palliative medicine continue to follow the patient.  Extensive goals of care discussions were held with the patient and family.  The patient wished to remain full scope of care.  However, she wanted to go home.  She did not want to go to LTAC.  Her family agreed to provide supportive care for home.  Home hospice was consulted and agreed to accept the patient for management after discharge.  At the time of discharge, the patient was afebrile and hemodynamically stable without any emesis.  She was able to tolerate small amounts of oral intake.  Her BMP and CBC remained stable.  DME and wound VAC were arranged for discharge.  After discussion with the surgical team, palliative medicine, the patient and her family, it was felt that going home with hospice following would be the best path forward regarding her continued care.  She was at a point where she was medically stable for transport home although there is high risk of progression of her cancer and further complications.  With this known fact, it was felt that going home with her family was the best situation.  Assessment and Plan: Small bowel obstruction Recurrent small bowel obstruction- last hospitalization 11/04/2023 through 11/07/2023 with partial small bowel obstruction-managed conservatively.  History of ovarian cancer with peritoneal carcinomatosis. -CTAP WC today-multiple persistent  fluid-filled dilated small bowel loops, slight interval worsening.  Distal obstruction with transition point likely over the midline pelvis.  Also findings consistent with carcinomatosis. (See detailed report) - Dr. Tarry, consulting - 01/15/24 CT abd/pelvis ordered to  rule out abscess -- findings concerning for abscess vs ascites with infection - holding apixaban  for now, IV heparin  on hold now due to thrombocytopenia   - Morphine  2 mg every 4 hourly as needed - Zofran  as needed - IR consulted regarding drain -- feels not enough collection for drain placement, US  paracentesis ordered for evaluation of fluid to rule out infection, etc. IR reviewed US  and not enough fluid in the 4 quadrants for paracentesis.   -- now having diarrhea, added antidiarrheals as needed for symptoms -- pt remains on Zosyn  IV per surgeon and s/p wound culture, wound Vac placed by surgeon on 8/15, dressing changed 8/17  -- Case was discussed with palliative medicine, patient, spouse, and general surgery.  It was felt that the patient was likely at a point where she could be safely transported home --Although the patient did have some lingering minor medical issues, it was felt in the patient's best interest to discharge home to be with family given her overall poor prognosis and high risk of recurrent complications --At the time of discharge, the patient was tolerating small amounts of oral intake without emesis and she continued to have ostomy output.  Her pain was controlled with opioids.  She was afebrile and hemodynamically stable.   History Pulmonary embolism Previously on Eliquis .  Hold Eliquis  due to thrombocytopenia. -IV heparin  infusion had to be stopped due to thrombocytopenia (platelets 38)  - 03/06/22 CTA chest--positive PE - do no plan to restart anticoagulation at this point   Primary peritoneal carcinomatosis  [facility] Dr. Rogers[facility].  S/p 3 cycles of adj platinum-based therapy after surgery, currently on olaparib , her maintenance bevacizumab  is being held per notes. -- pt continues to decline, I called for another more urgent goals of care discussion on 8/15 with palliative team as I worry that she is dying and want to unburden her from  unnecessary pain and suffering --Multiple GOC discussions were held with the patient and family and palliative medicine. -- pt saying she wants to go home so she is considering home hospice care -- With assistance of TOC and palliative medicine, home hospice and DME were arranged   Essential hypertension Stable.  Not on antihypertensives   Hypoglycemia with hypoglycemic unawareness  -- exacerbated by poor oral intake -- not on any antihyperglycemic agents -- CBG every 4 hours -- encourage oral intake -- concerning that this has been occurring more frequently than we realize -- pt still not eating/drinking well and now remains dependent on continuous D10 infusion to avoid recurrent hypoglycemia  -- D10 infusion initiated and tapered as oral intake improved       Consultants: general surgery, palliative medicine Procedures performed: none  Disposition: Home Diet recommendation:  Soft diet DISCHARGE MEDICATION: Allergies as of 01/22/2024       Reactions   Augmentin  [amoxicillin -pot Clavulanate] Diarrhea, Nausea And Vomiting   Doxycycline Other (See Comments)   Abdominal cramps.   Topamax  [topiramate ] Nausea And Vomiting        Medication List     STOP taking these medications    Eliquis  5 MG Tabs tablet Generic drug: apixaban    potassium chloride  SA 20 MEQ tablet Commonly known as: KLOR-CON  M  TAKE these medications    acetaminophen  500 MG tablet Commonly known as: TYLENOL  Take 2 tablets (1,000 mg total) by mouth every 8 (eight) hours.   atorvastatin  20 MG tablet Commonly known as: LIPITOR Take 1 tablet (20 mg total) by mouth at bedtime.   ciprofloxacin  500 MG tablet Commonly known as: CIPRO  Take 1 tablet (500 mg total) by mouth 2 (two) times daily.   DULoxetine  30 MG capsule Commonly known as: CYMBALTA  Take 2 capsules (60 mg total) by mouth daily.   ferrous sulfate  325 (65 FE) MG tablet Take 1 tablet (325 mg total) by mouth daily with  breakfast. Start taking on: January 23, 2024   HYDROmorphone  2 MG tablet Commonly known as: Dilaudid  Take 0.5 tablets (1 mg total) by mouth every 4 (four) hours as needed for severe pain (pain score 7-10).   lidocaine -prilocaine  cream Commonly known as: EMLA  Apply a quarter sized amount to port a cath site and cover with plastic wrap 1 hour prior to infusion appointments   Lynparza  150 MG tablet Generic drug: olaparib  TAKE 2 TABLETS TWICE A DAY. MAY TAKE WITH FOOD TO DECEASE NAUSEA AND VOMITING. (SWALLOW WHOLE)   magnesium  oxide 400 (240 Mg) MG tablet Commonly known as: MAG-OX TAKE 1 TABLET BY MOUTH TWICE A DAY   megestrol  400 MG/10ML suspension Commonly known as: MEGACE  Take 10 mLs (400 mg total) by mouth daily. Start taking on: January 23, 2024   methocarbamol  500 MG tablet Commonly known as: ROBAXIN  Take 1 tablet (500 mg total) by mouth 3 (three) times daily.        Discharge Exam: Filed Weights   01/06/24 1313 01/06/24 1924 01/07/24 1359  Weight: 80.6 kg 79.9 kg 79.9 kg   HEENT:  [place]/AT, No thrush, no icterus CV:  RRR, no rub, no S3, no S4 Lung:  bibasilar rales. No wheeze Abd:  soft/+BS, NT Ext:  trace LE edema, no lymphangitis, no synovitis, no rash   Condition at discharge: stable  The results of significant diagnostics from this hospitalization (including imaging, microbiology, ancillary and laboratory) are listed below for reference.   Imaging Studies: US  ASCITES (ABDOMEN LIMITED) Result Date: 01/17/2024 CLINICAL DATA:  [id number] Infected ascites [id number] Patient with concern for malignant small bowel obstruction s/p exploratory laparotomy, lysis of adhesions, small bowel resection and creation of end ileostomy 01/07/24. Patient recently noted to have purulent drainage from midline incision and CT abd/pelvis shows enhancing peritoneal fluid in bilateral pericolic gutters concerning for infection. Request for possible paracentesis. EXAM: LIMITED ABDOMEN ULTRASOUND  FOR ASCITES TECHNIQUE: Limited ultrasound survey for ascites was performed in all four abdominal quadrants. COMPARISON:  CT abdomen pelvis with contrast 01/15/2024 FINDINGS: Limited abdominal ultrasound of all 4 quadrants and midline was performed at bedside. No free fluid seen. IMPRESSION: No free fluid within the imaged abdomen. Paracentesis was NOT performed. Electronically Signed   By: Thom Hall M.D.   On: 01/17/2024 10:41   CT ABDOMEN PELVIS W CONTRAST Result Date: 01/15/2024 CLINICAL DATA:  Evaluate for abscess.  History of ovarian cancer. EXAM: CT ABDOMEN AND PELVIS WITH CONTRAST TECHNIQUE: Multidetector CT imaging of the abdomen and pelvis was performed using the standard protocol following bolus administration of intravenous contrast. RADIATION DOSE REDUCTION: This exam was performed according to the departmental dose-optimization program which includes automated exposure control, adjustment of the mA and/or kV according to patient size and/or use of iterative reconstruction technique. CONTRAST:  OMNIPAQUE  IOHEXOL  300 MG/ML  SOLN COMPARISON:  CT abdomen and pelvis 01/06/2024 FINDINGS:  Lower chest: There is a new trace left pleural effusion. Hepatobiliary: Gallstones are again seen. No biliary ductal dilatation. There is new subcapsular enhancing fluid collection measuring 11 mm along the inferior left margin of the liver. Small foci of air seen within this collection. Pancreas: Unremarkable. No pancreatic ductal dilatation or surrounding inflammatory changes. Spleen: Normal in size without focal abnormality. Adrenals/Urinary Tract: There is a 2.5 cm cyst in the left kidney. Otherwise, the kidneys and adrenal glands are within normal limits. There is diffuse bladder wall thickening, particularly along the dome of the bladder with surrounding inflammation. There is a small amount of air in the bladder which is new. Stomach/Bowel: There is a new right-sided ostomy present. Bowel loops leading to  the ostomy demonstrates diffuse wall thickening with surrounding inflammation over moderate length segment. Small bowel proximal to this level is dilated with air-fluid levels measuring up to 3.8 cm. The stomach is moderately distended with fluid. There is gastric antral wall thickening. Visualized colonic loops appear decompressed. The appendix is not seen. No pneumatosis. Vascular/Lymphatic: Aorta and IVC are normal in in size. There are atherosclerotic calcifications of the aorta. Vascular stent is seen in the left common iliac artery. No discrete enlarged lymph nodes are identified. Reproductive: Uterus appears small and unchanged. Adnexa are unremarkable. Other: There is a new small amount of free air in the anterior abdomen which may be related to recent surgery. Enhancing peritoneal fluid collections are seen in the bilateral pericolic gutters, new from prior. Left-sided collection measures 3.6 x 5.8 by 10 cm. Right-sided collection measures 4.7 by 2.1 by 8.6 cm. Small areas of enhancing peritoneal fluid are noted in the lower anterior pelvis. There is some tethering of small bowel loops in the anterior pelvis similar to prior. There is new midline abdominal wall skin staple line and subcutaneous edema. Medication injection site noted in the left anterior abdomen. Musculoskeletal: No acute fracture. IMPRESSION: 1. New right-sided ostomy. Bowel loops leading to the ostomy demonstrates diffuse wall thickening with surrounding inflammation worrisome for nonspecific enteritis. Small bowel proximal to this level is dilated with air-fluid levels worrisome for small bowel obstruction. 2. New enhancing peritoneal fluid collections in the bilateral pericolic gutters and lower anterior pelvis worrisome for abscesses. 3. New subcapsular enhancing fluid collection along the inferior left margin of the liver worrisome for abscess. 4. New small amount of free air in the anterior abdomen may be related to recent surgery. 5.  New trace left pleural effusion. 6. Diffuse bladder wall thickening with surrounding inflammation worrisome for cystitis. There is a small amount of air in the bladder which may be related to recent instrumentation. 7. Gastric antral wall thickening worrisome for gastritis. 8. Cholelithiasis. 9. Aortic atherosclerosis. Aortic Atherosclerosis (ICD10-I70.0). Electronically Signed   By: Greig Pique M.D.   On: 01/15/2024 17:36   DG Chest Port 1 View Result Date: 01/07/2024 CLINICAL DATA:  [id number] Encounter for nasogastric (NG) tube placement [id number] EXAM: PORTABLE CHEST 1 VIEW COMPARISON:  Chest x-ray 02/16/2022, CT chest 03/06/2022 FINDINGS: Enteric tube with tip overlying the gastric lumen and side port overlying the expected region of the distal esophageal lumen. Lower lung zones are visualized and grossly unremarkable. Upper lung zones are collimated off view. Nonobstructive bowel gas pattern. No acute osseous abnormality. Multiple calcified stone within the right upper quadrant consistent with cholelithiasis. IMPRESSION: 1. Enteric tube with tip overlying the gastric lumen and side port overlying the expected region of the distal esophageal lumen. Recommend advancing by 8 cm. 2.  Cholelithiasis. Electronically Signed   By: Morgane  Naveau M.D.   On: 01/07/2024 19:03   CT ABDOMEN PELVIS W CONTRAST Result Date: 01/06/2024 CLINICAL DATA:  Abdominal pain with nausea and vomiting two days ago. History of ovarian cancer. EXAM: CT ABDOMEN AND PELVIS WITH CONTRAST TECHNIQUE: Multidetector CT imaging of the abdomen and pelvis was performed using the standard protocol following bolus administration of intravenous contrast. RADIATION DOSE REDUCTION: This exam was performed according to the departmental dose-optimization program which includes automated exposure control, adjustment of the mA and/or kV according to patient size and/or use of iterative reconstruction technique. CONTRAST:  OMNIPAQUE  IOHEXOL  300 MG/ML   SOLN COMPARISON:  11/04/2023 FINDINGS: Lower chest: Heart is normal size.  Lung bases are clear. Hepatobiliary: Liver and biliary tree are normal. There is moderate cholelithiasis. Pancreas: Normal. Spleen: Normal. Adrenals/Urinary Tract: Adrenal glands are normal. Kidneys are normal in size without hydronephrosis or nephrolithiasis. Stable left renal cyst. Ureters are normal. Mild hazy bladder wall thickening circumferentially unchanged. Stomach/Bowel: Stomach is normal. Evidence of multiple persistent fluid-filled dilated small bowel loops measuring up to 4.8 cm in diameter with slight interval worsening. This appears to be due to a distal obstruction with transition point likely over the midline pelvis involving an ileal loop. Air is present throughout the colon. Appendix not definitely visualized. Vascular/Lymphatic: Abdominal aorta is normal in caliber with minimal calcified plaque distally. Remaining vascular structures are unremarkable. No adenopathy. Reproductive: Small ill-defined uterus containing peripherally enhancing 2.1 cm mass likely uterine fibroid as these findings are unchanged. Adnexal regions unremarkable. Other: Worsening mild free fluid over the upper abdomen with minimal free fluid adjacent the bladder. Acute evidence of small anterior peritoneal implants without significant change several small masses within the subcutaneous fat of the anterior lower abdominal/pelvic wall which are new as cannot exclude metastatic disease. Small ventral hernia right of midline over the mid to upper abdomen unchanged and containing only peritoneal fat. Periumbilical hernia less prominent containing partial segment of small bowel. Musculoskeletal: No focal abnormality. IMPRESSION: 1. Evidence of multiple persistent fluid-filled dilated small bowel loops measuring up to 4.8 cm in diameter with slight interval worsening. This appears to be due to a distal obstruction with transition point likely over the midline  pelvis involving an ileal loop likely due to adhesions. 2. Worsening mild free fluid over the upper abdomen with minimal stable free fluid adjacent the bladder. 3. Evidence of small anterior peritoneal implants without significant change. Several small masses within the subcutaneous fat of the anterior lower abdominal/pelvic wall which are new and may be additional sites of metastatic disease in this patient with known ovarian cancer. 4. Cholelithiasis. 5. Stable left renal cyst. 6. Small ventral hernia right of midline over the mid to upper abdomen unchanged and containing only peritoneal fat. Periumbilical hernia less prominent containing partial segment of small bowel. 7. Aortic atherosclerosis. Aortic Atherosclerosis (ICD10-I70.0). Electronically Signed   By: Toribio Agreste M.D.   On: 01/06/2024 16:09    Microbiology: Results for orders placed or performed during the hospital encounter of 01/06/24  MRSA Next Gen by PCR, Nasal     Status: None   Collection Time: 01/17/24 10:18 AM   Specimen: Nasal Mucosa; Nasal Swab  Result Value Ref Range Status   MRSA by PCR Next Gen NOT DETECTED NOT DETECTED Final    Comment: (NOTE) The GeneXpert MRSA Assay (FDA approved for NASAL specimens only), is one component of a comprehensive MRSA colonization surveillance program. It is not intended to diagnose  MRSA infection nor to guide or monitor treatment for MRSA infections. Test performance is not FDA approved in patients less than 72 years old. Performed at [facility], [address] [place], [name] [address]   Aerobic Culture w Gram Stain (superficial specimen)     Status: None (Preliminary result)   Collection Time: 01/19/24  4:17 PM   Specimen: Incision; Wound  Result Value Ref Range Status   Specimen Description   Final    INCISION Performed at [facility], [address], [place], [name] [address]    Special Requests   Final    Immunocompromised Performed at [facility], [address],  [place], [name] [address]    Gram Stain   Final    MODERATE WBC PRESENT, PREDOMINANTLY PMN ABUNDANT GRAM POSITIVE COCCI    Culture   Final    FEW PROTEUS SPECIES CULTURE REINCUBATED FOR BETTER GROWTH Performed at [facility], [address], [place], [name] [address]    Report Status PENDING  Incomplete   Organism ID, Bacteria PROTEUS SPECIES  Final      Susceptibility   Proteus species - MIC*    AMPICILLIN >=32 RESISTANT Resistant     CEFEPIME <=0.12 SENSITIVE Sensitive     ERTAPENEM <=0.12 SENSITIVE Sensitive     CEFTRIAXONE  <=0.25 SENSITIVE Sensitive     CIPROFLOXACIN  <=0.06 SENSITIVE Sensitive     GENTAMICIN <=1 SENSITIVE Sensitive     MEROPENEM <=0.25 SENSITIVE Sensitive     TRIMETH /SULFA  <=20 SENSITIVE Sensitive     AMPICILLIN/SULBACTAM 8 SENSITIVE Sensitive     PIP/TAZO Value in next row Sensitive ug/mL     <=4 SENSITIVEThis is a modified FDA-approved test that has been validated and its performance characteristics determined by the reporting laboratory.  This laboratory is certified under the Clinical Laboratory Improvement Amendments CLIA as qualified to perform high complexity clinical laboratory testing.    * FEW PROTEUS SPECIES    Labs: CBC: Recent Labs  Lab 01/17/24 0205 01/18/24 0416 01/19/24 0609 01/20/24 0517 01/21/24 0524  WBC 8.5 7.9 6.3 6.6 7.2  HGB 11.3* 12.2 11.2* 11.3* 10.4*  HCT 35.0* 37.8 34.6* 34.4* 31.8*  MCV 102.3* 102.7* 104.2* 103.0* 103.9*  PLT 46* 43* 38* 31* 27*   Basic Metabolic Panel: Recent Labs  Lab 01/17/24 0205 01/18/24 0416 01/19/24 0609 01/20/24 0517 01/21/24 0524  NA 131* 131* 131* 128* 130*  K 3.5 3.3* 5.0 4.3 5.1  CL 94* 96* 96* 94* 95*  CO2 24 24 25 25 27   GLUCOSE 141* 114* 115* 96 110*  BUN 15 12 9 9 11   CREATININE 0.86 0.78 0.69 0.60 0.62  CALCIUM  7.4* 7.7* 7.8* 7.6* 7.6*  MG  --  2.2  --   --   --    Liver Function Tests: Recent Labs  Lab 01/17/24 0205 01/18/24 0416 01/19/24 0609 01/20/24 0517  01/21/24 0524  AST 50* 39 35 39 42*  ALT 34 28 21 18 16   ALKPHOS 80 87 82 78 86  BILITOT 1.9* 1.8* 1.6* <0.2 1.5*  PROT 4.9* 5.6* 5.2* 5.2* 5.3*  ALBUMIN <1.5* 1.6* <1.5* <1.5* <1.5*   CBG: Recent Labs  Lab 01/21/24 2012 01/22/24 0013 01/22/24 0355 01/22/24 0752 01/22/24 1138  GLUCAP 98 106* 91 94 93    Discharge time spent: greater than 30 minutes.  Signed: Alm Schneider, MD [facility] 01/22/2024

## 2024-01-22 NOTE — Progress Notes (Addendum)
 Wound Vac been air  leaking and draining purulent discharge after multiple reinforcement, whole dressing changed, cleaned wound and did whole new  woundVac setup, also changed the ostomy pouch, has been working good, patient feels comfortable, safety precautions initiated, bed in lowest position, family at bedside.

## 2024-01-22 NOTE — Consult Note (Signed)
 WOC Nurse Consult Note:   WOC team consulted to replace NPWT for dc home.  After chart review and discussion with [facility], current home device at bedside is a Medela, not compatible with [employer] dressings in place.  Bedside nursing will need to remove current NPWT dressing and replace with wet to dry, Hospice agency will need to reapply Madela Vac once patient is in the home.  WOC team will not consult at this time for that reason.  Bedside RN, AC, and TOC made aware of this information.     Thank you,  Doyal Polite, RN, MSN, [facility] WOC Team [phone] (Available Mon-Fri 0700-1500)

## 2024-01-22 NOTE — Plan of Care (Signed)

## 2024-01-22 NOTE — Progress Notes (Signed)
 Arrangements being made for home hospice.  Patient's appetite is still decreased.  Abdomen is soft with a colostomy in place and a wound VAC without leak.  Wound VAC will need to be changed 3 times a week.  Staples that are in place can be removed on 01/29/2024.

## 2024-01-22 NOTE — Progress Notes (Signed)
 Daily Progress Note   Patient Name: Nicole Santos       Date: 01/22/2024 DOB: 1971/01/04  Age: 53 y.o. MRN#: [id number] Attending Physician: Evonnie Lenis, MD Primary Care Physician: Jolinda Norene HERO, DO Admit Date: 01/06/2024  Reason for Consultation/Follow-up: Establishing goals of care and help with dispo to home hospice  Subjective:   53 y.o. female  with past medical history of ovarian cancer (11/14/2021), peritoneal carcinomatosis, history of cervical cancer (2000), sleep apnea (not on CPAP), hypertension, depression, anxiety, history of PE, history of diabetes that resolved with weight loss admitted on 01/06/2024 with intractable nausea and vomiting.   Recurrent hospitalizations for SBO (most recent 11/2023). Managed conservatively. Symptoms improved with bowel rest.  She was discharged home.  She was followed by her GYN oncologist and medical oncologist in the interim.  Unfortunately, she had a worsening in her abdominal pain as well as some associated nausea and vomiting and was readmitted on 01/06/2024 she underwent exploratory laparotomy on 01/07/2024.  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  Surgical team unable to release any of her small bowel that was adherent to her pelvis and as such an end ileostomy was placed.  Noted to have numerous nodules throughout her abdomen and significant peritoneal carcinomatosis.  Admitted to the floor for further management and pain control.  Surgery recommends palliative referral for goals of care.   01/08/2024: Goals of care discussion completed.  Patient declines to make any changes to her advance directives at this time.  She is hopeful to hear from her oncology team about treatment options.  She also does not wish to make any changes to her advance directives without first  discussing with her entire family.  She will let us  know if she desires family meeting but declined need for 1 at this time.  We adjusted her pain regimen due to end of dose breakthrough pain.   01/09/2024: Symptom management reviewed. Symptoms stable/improving. Not interested in GOC/ACP discussion until able to speak with oncology.    01/10/2024: Followed up for ongoing goals of care and symptom management.  She continued to require IV opioids for pain control.  Nausea and GERD symptoms had improved.  She declined to update advanced directives but would consider outpatient palliative referral.  Will let me know if she decides to proceed with this.   01/14/2024: Continue to require IV opioids for pain management.  Continue to be followed by surgery and admitting team.  Goals of care and advance care planning discussion attempted.  Patient declined to make any changes to her advance directive at that  time.  Agreeable to outpatient palliative referral.   01/17/2024: GOC/ACP discussion completed x 3.  Extensive education provided regarding her concerns about patient's continued deterioration especially in the context of her terminal cancer diagnosis.  Patient did not wish to make any decisions or updates as it relates to her advance directives.   01/20/2024: Met with patient, symptom regimen adjusted for better pain control.  Attempted advance care planning/goals of care discussion.  Patient shared again her goal is to return home.  Declined to make any changes to her advance directives or any further decisions regarding her care.  01/21/2024: extensive GOC/ACP discussion. Patient's goal is to return home. Decision was made to return home with hospice support. [facility] hospice is involved and working on plan for discharge.   Today, vital signs normal temp.  Mildly tachycardic.  Blood pressures occasionally in the 90s/100 systolic.  O2 sat stable. MAR reviewed. Total of 1.5 mg IV dilaudid  used on 24 hr  look back. Hydromorphone  1 mg PO on 24 hr look back.   Chart review/care coordination:  Completed extensive chart review including EPIC notes, vital signs, MAR. Coordinated care with attending physician, surgeon, bedside nursing staff, TOC, and hospice liaison.     Length of Stay: 15   Physical Exam          Vital Signs: BP 106/67   Pulse (!) 105   Temp 98 F (36.7 C) (Oral)   Resp 17   Ht 5' 2 (1.575 m)   Wt 79.9 kg   SpO2 99%   BMI 32.22 kg/m  SpO2: SpO2: 99 % O2 Device: O2 Device: Room Air O2 Flow Rate:        Palliative Assessment/Data:   Palliative Care Assessment & Plan   Patient Profile/Assessment:  ***   Recommendations/Plan: ***   Symptom management:  ***  Prognosis:  {Palliative Care Prognosis:23504}    Discharge Planning: {Palliative dispostion:23505}    Detailed review of medical records (labs, imaging, vital signs), medically appropriate exam, discussed with treatment team, counseling and education to patient, family, & staff, documenting clinical information, medication management, coordination of care   Total time: I spent *** minutes in the care of the patient today in the above activities and documenting the encounter.   Billing based on MDM: ***  {Problems Addressed:304933}  {Amount and/or Complexity of Ijuj:695065}  {Risks:304936}         Laymon CHRISTELLA Pinal, NP  Palliative Medicine Team Team phone # [phone]  Thank you for allowing the Palliative Medicine Team to assist in the care of this patient. Please utilize secure chat with additional questions, if there is no response within 30 minutes please call the above phone number.  Palliative Medicine Team providers are available by phone from 7am to 7pm daily and can be reached through the team cell phone.  Should this patient require assistance outside of these hours, please call the patient's attending physician.

## 2024-01-22 NOTE — Progress Notes (Signed)
 Wound vac and wound vac supplies was delivered for home use to patient and husband who is in patient room. Ostomy supplies ordered and given to patient husband in a zip lock bag for home use. Ostomy supplies that were ordered were the numbers that were provided by ostomy/wound RN. Heparin  flush ordered per protocol to be given prior to de-accessing port and discharge home in the morning. Husband states he will be taking patient home.

## 2024-01-22 NOTE — TOC Progression Note (Signed)
 Transition of Care [facility]) - Progression Note    Patient Details  Name: Nicole Santos MRN: [id number] Date of Birth: 02-10-1971  [facility] CM/SW Contact  Lucie Lunger, [place] Phone Number: 01/22/2024, 3:09 PM  Clinical Narrative:    CSW spoke to [facility] with [facility] who confirms they will be able to have a hospice RN come to pts home tomorrow afternoon to place the new wound vac for pt. Treatment team updated on plan for D/C tmrw AM. Supplies have been provided to pts family. Pts spouse will provide transportation home for pt. TOC to follow.     Barriers to Discharge: Continued Medical Work up               Expected Discharge Plan and Services         Expected Discharge Date: 01/22/24                         [facility] Arranged: Patient Refused HH           Social Drivers of Health (SDOH) Interventions SDOH Screenings   Food Insecurity: No Food Insecurity (01/06/2024)  Housing: Low Risk  (01/06/2024)  Transportation Needs: No Transportation Needs (01/06/2024)  Utilities: Not At Risk (01/06/2024)  Depression (PHQ2-9): Low Risk  (01/01/2024)  Tobacco Use: Low Risk  (01/07/2024)    Readmission Risk Interventions    11/06/2023    9:51 AM  Readmission Risk Prevention Plan  Transportation Screening Complete  Home Care Screening Complete  Medication Review (RN CM) Complete

## 2024-01-22 NOTE — Progress Notes (Signed)
 [facility] rm 6 [facility] Liaison RN note  Received request from [facility] for hospice services at home after discharge. Chart and patient information under review by Hospice physician. Hospice eligibility pending at this time.    Spoke with husband Chyrl in family room to initiate education related to hospice philosophy, services, and team approach to care. Patient/family verbalized understanding of information given.  Per discussion, the plan is for discharge home by EMS transport once all arrangements can be made.   DME needs discussed. Patient has the following equipment in the home. Wheelchair and bedside commode.   Patient/family requests the following equipment for deliver: hospital bed, overbed table and negative pressure wound therapy.   Address has been verified and is correct in the chart. Chyrl is the family contact to arrange time of equipment delivery.   Please send signed and completed DNR with patient/family. Please provide prescriptions at discharge as needed for ongoing symptom management.  [facility] information and contact numbers given to [place].   Above information shared with [facility]. Please call with any hospice related questions or concerns.  Thank you for the opportunity to participate in this patient's care.  Hunter Seip, Charity fundraiser, BSN [employer] [phone]

## 2024-01-23 DIAGNOSIS — Z515 Encounter for palliative care: Secondary | ICD-10-CM | POA: Diagnosis not present

## 2024-01-23 DIAGNOSIS — R52 Pain, unspecified: Secondary | ICD-10-CM | POA: Diagnosis not present

## 2024-01-23 DIAGNOSIS — K56609 Unspecified intestinal obstruction, unspecified as to partial versus complete obstruction: Secondary | ICD-10-CM | POA: Diagnosis not present

## 2024-01-23 DIAGNOSIS — C482 Malignant neoplasm of peritoneum, unspecified: Secondary | ICD-10-CM | POA: Diagnosis not present

## 2024-01-23 DIAGNOSIS — I10 Essential (primary) hypertension: Secondary | ICD-10-CM | POA: Diagnosis not present

## 2024-01-23 DIAGNOSIS — Z7189 Other specified counseling: Secondary | ICD-10-CM | POA: Diagnosis not present

## 2024-01-23 LAB — GLUCOSE, CAPILLARY
Glucose-Capillary: 95 mg/dL (ref 70–99)
Glucose-Capillary: 87 mg/dL (ref 70–99)

## 2024-01-23 MED ORDER — LACTATED RINGERS IV BOLUS
1000.0000 mL | Freq: Once | INTRAVENOUS | Status: AC
  Administered 2024-01-23: 1000 mL via INTRAVENOUS

## 2024-01-23 NOTE — Discharge Summary (Signed)
 Physician Discharge Summary   Patient: Nicole Santos MRN: [id number] DOB: [birth year]  Admit date:     01/06/2024  Discharge date: 01/23/24  Discharge Physician: Alm Denzel Etienne   PCP: Jolinda Norene HERO, DO   Recommendations at discharge:   Please follow up with primary care provider within 1-2 weeks  Please repeat BMP and CBC in one week     Hospital Course: 53 y.o. female with medical history significant for ovarian cancer, peritoneal carcinomatosis, pulmonary embolism, hypertension.  Patient presented to the ED with complaints of worsening of her chronic abdominal pain that started 3 days ago, with vomiting.  She has been compliant with soft diet as recommended by her providers, but she reports persistent pain with oral intake.   Last hospitalization 11/04/2023 through 11/07/2023 with partial small bowel obstruction-managed conservatively   ED Course: Initial tachycardia heart rate up to 127 now improved. CT AP with contrast-suggest small bowel obstruction.  EDP talked to general surgeon Dr. Evonnie, will see in consult, can hold NG tube as patient has not had any vomiting in 2 days.   01/07/2024 status post exploratory laparotomy, lysis of adhesion, small bowel resection,  ileostomy creation   Her postoperative course was characterized by slow improvement.  General surgery continue to follow for postoperative care.  Repeat CT of the abdomen pelvis on 01/15/2024 showed small bowel loops leading to the ostomy with some wall thickening and inflammation.  The small bowel proximal to this was dilated concerning for SBO.  There was new enhancing peritoneal fluid collections in the paracolic gutters.  The patient was started on Zosyn . She remained afebrile and hemodynamically stable.  She continued to have drainage from her midline wound.  Her wound VAC was placed and managed by general surgery.  She continued to have decreased oral intake.  She was started on Megace  with minimal improvement.  Her diet was  advanced.  Although her oral intake was minimal, she did not have any emesis.  She did have output from her ostomy suggesting she was not clinically obstructed.  She had intermittent hypoglycemia for which she required D10 drip.  Her CBGs gradually improved as her oral intake somewhat improved.  Palliative medicine continue to follow the patient.  Extensive goals of care discussions were held with the patient and family.  The patient wished to remain full scope of care.  However, she wanted to go home.  She did not want to go to LTAC.  Her family agreed to provide supportive care for home.  Home hospice was consulted and agreed to accept the patient for management after discharge.  At the time of discharge, the patient was afebrile and hemodynamically stable without any emesis.  She was able to tolerate small amounts of oral intake and her ostomy was producing stool.  Her BMP and CBC remained stable.  DME and wound VAC were arranged for discharge.  After discussion with the surgical team, palliative medicine, the patient and her family, it was felt that going home with hospice following would be the best path forward regarding her continued care.  She was at a point where she was medically stable for transport home although there is high risk of progression of her cancer and further complications.  With this known fact, it was felt that going home with her family was the best situation. Discharge was delayed until 01/23/24 due to care coordination with hospice and [facility] and arrangement of wound VAC for home use.  Assessment and Plan: Small bowel  obstruction Recurrent small bowel obstruction- last hospitalization 11/04/2023 through 11/07/2023 with partial small bowel obstruction-managed conservatively.  History of ovarian cancer with peritoneal carcinomatosis. -CTAP  8/4-multiple persistent fluid-filled dilated small bowel loops, slight interval worsening.  Distal obstruction with transition point likely over the  midline pelvis.  Also findings consistent with carcinomatosis. (See detailed report) - Dr. Tarry, consulting - 01/15/24 CT abd/pelvis ordered to rule out abscess -- findings concerning for abscess vs ascites with infection - holding apixaban  for now, IV heparin  on hold now due to thrombocytopenia   - Morphine  2 mg every 4 changed to IV dialaudid - d/c home with dilaudid  po - Zofran  as needed - IR consulted regarding drain -- feels not enough collection for drain placement, US  paracentesis ordered for evaluation of fluid to rule out infection, etc. IR reviewed US  and not enough fluid in the 4 quadrants for paracentesis.   -- now having diarrhea, added antidiarrheals as needed for symptoms -- pt remains on Zosyn  IV per surgeon and s/p wound culture, wound Vac placed by surgeon on 8/15, dressing changed 8/17  -- Case was discussed with palliative medicine, patient, spouse, and general surgery.  It was felt that the patient was likely at a point where she could be safely transported home --Although the patient did have some lingering minor medical issues, it was felt in the patient's best interest to discharge home to be with family given her overall poor prognosis and high risk of recurrent complications --At the time of discharge, the patient was tolerating small amounts of oral intake without emesis or worsen abd pain and she continued to have good ostomy output.  Her pain was controlled with opioids.  She was afebrile and hemodynamically stable. -- d/c home with 7 more days cipro  for proteus in wound culture 8/17   History Pulmonary embolism Previously on Eliquis .  Hold Eliquis  due to thrombocytopenia. -IV heparin  infusion had to be stopped due to thrombocytopenia (platelets 38)  - 03/06/22 CTA chest--positive PE - do no plan to restart anticoagulation at this point   High-grade serous carcinoma with peritoneal carcinomatosis  Follow[facility]. Katragadda[facility].  S/p 3 cycles of adj platinum-based therapy after surgery, currently on olaparib , her maintenance bevacizumab  is being held per notes. -- pt continued to decline, Dr. Vicci called for another more urgent goals of care discussion on 8/15 with palliative team as I worry that she is dying and want to unburden her from unnecessary pain and suffering --Multiple GOC discussions were held with the patient and family and palliative medicine. -- pt saying she wants to go home so she is considering home hospice care -- With assistance of TOC and palliative medicine, home hospice and DME were arranged   Essential hypertension Stable.  Not on antihypertensives   Hypoglycemia with hypoglycemic unawareness  -- exacerbated by poor oral intake -- not on any antihyperglycemic agents -- CBG every 4 hours -- encourage oral intake -- concerning that this has been occurring more frequently than we realize -- pt still not eating/drinking well and now remains dependent on continuous D10 infusion to avoid recurrent hypoglycemia  -- D10 infusion initiated and tapered as oral intake improved       Consultants: palliative, general surgery, med/onc, IR Procedures performed: ex lap with SB resection 8/5  Disposition: Home Diet recommendation:  Soft diet DISCHARGE MEDICATION: Allergies as of 01/23/2024       Reactions   Augmentin  [amoxicillin -pot Clavulanate] Diarrhea, Nausea And Vomiting  Doxycycline Other (See Comments)   Abdominal cramps.   Topamax  [topiramate ] Nausea And Vomiting        Medication List     STOP taking these medications    Eliquis  5 MG Tabs tablet Generic drug: apixaban    potassium chloride  SA 20 MEQ tablet Commonly known as: KLOR-CON  M       TAKE these medications    acetaminophen  500 MG tablet Commonly known as: TYLENOL  Take 2 tablets (1,000 mg total) by mouth every 8 (eight) hours.   atorvastatin  20 MG tablet Commonly known as: LIPITOR Take 1 tablet  (20 mg total) by mouth at bedtime.   ciprofloxacin  500 MG tablet Commonly known as: CIPRO  Take 1 tablet (500 mg total) by mouth 2 (two) times daily.   DULoxetine  30 MG capsule Commonly known as: CYMBALTA  Take 2 capsules (60 mg total) by mouth daily.   ferrous sulfate  325 (65 FE) MG tablet Take 1 tablet (325 mg total) by mouth daily with breakfast.   HYDROmorphone  2 MG tablet Commonly known as: Dilaudid  Take 0.5 tablets (1 mg total) by mouth every 4 (four) hours as needed for severe pain (pain score 7-10).   lidocaine -prilocaine  cream Commonly known as: EMLA  Apply a quarter sized amount to port a cath site and cover with plastic wrap 1 hour prior to infusion appointments   Lynparza  150 MG tablet Generic drug: olaparib  TAKE 2 TABLETS TWICE A DAY. MAY TAKE WITH FOOD TO DECEASE NAUSEA AND VOMITING. (SWALLOW WHOLE)   magnesium  oxide 400 (240 Mg) MG tablet Commonly known as: MAG-OX TAKE 1 TABLET BY MOUTH TWICE A DAY   megestrol  400 MG/10ML suspension Commonly known as: MEGACE  Take 10 mLs (400 mg total) by mouth daily.   methocarbamol  500 MG tablet Commonly known as: ROBAXIN  Take 1 tablet (500 mg total) by mouth 3 (three) times daily.        Discharge Exam: Filed Weights   01/06/24 1313 01/06/24 1924 01/07/24 1359  Weight: 80.6 kg 79.9 kg 79.9 kg   HEENT:  [place]/AT, No thrush, no icterus CV:  RRR, no rub, no S3, no S4 Lung: fine basilar crackles. No wheeze Abd:  soft/+BS, NT Ext:  trac LE edema, no lymphangitis, no synovitis, no rash   Condition at discharge: stable  The results of significant diagnostics from this hospitalization (including imaging, microbiology, ancillary and laboratory) are listed below for reference.   Imaging Studies: US  ASCITES (ABDOMEN LIMITED) Result Date: 01/17/2024 CLINICAL DATA:  [id number] Infected ascites [id number] Patient with concern for malignant small bowel obstruction s/p exploratory laparotomy, lysis of adhesions, small bowel resection  and creation of end ileostomy 01/07/24. Patient recently noted to have purulent drainage from midline incision and CT abd/pelvis shows enhancing peritoneal fluid in bilateral pericolic gutters concerning for infection. Request for possible paracentesis. EXAM: LIMITED ABDOMEN ULTRASOUND FOR ASCITES TECHNIQUE: Limited ultrasound survey for ascites was performed in all four abdominal quadrants. COMPARISON:  CT abdomen pelvis with contrast 01/15/2024 FINDINGS: Limited abdominal ultrasound of all 4 quadrants and midline was performed at bedside. No free fluid seen. IMPRESSION: No free fluid within the imaged abdomen. Paracentesis was NOT performed. Electronically Signed   By: Thom Hall M.D.   On: 01/17/2024 10:41   CT ABDOMEN PELVIS W CONTRAST Result Date: 01/15/2024 CLINICAL DATA:  Evaluate for abscess.  History of ovarian cancer. EXAM: CT ABDOMEN AND PELVIS WITH CONTRAST TECHNIQUE: Multidetector CT imaging of the abdomen and pelvis was performed using the standard protocol following bolus administration of intravenous contrast. RADIATION  DOSE REDUCTION: This exam was performed according to the departmental dose-optimization program which includes automated exposure control, adjustment of the mA and/or kV according to patient size and/or use of iterative reconstruction technique. CONTRAST:  OMNIPAQUE  IOHEXOL  300 MG/ML  SOLN COMPARISON:  CT abdomen and pelvis 01/06/2024 FINDINGS: Lower chest: There is a new trace left pleural effusion. Hepatobiliary: Gallstones are again seen. No biliary ductal dilatation. There is new subcapsular enhancing fluid collection measuring 11 mm along the inferior left margin of the liver. Small foci of air seen within this collection. Pancreas: Unremarkable. No pancreatic ductal dilatation or surrounding inflammatory changes. Spleen: Normal in size without focal abnormality. Adrenals/Urinary Tract: There is a 2.5 cm cyst in the left kidney. Otherwise, the kidneys and adrenal glands  are within normal limits. There is diffuse bladder wall thickening, particularly along the dome of the bladder with surrounding inflammation. There is a small amount of air in the bladder which is new. Stomach/Bowel: There is a new right-sided ostomy present. Bowel loops leading to the ostomy demonstrates diffuse wall thickening with surrounding inflammation over moderate length segment. Small bowel proximal to this level is dilated with air-fluid levels measuring up to 3.8 cm. The stomach is moderately distended with fluid. There is gastric antral wall thickening. Visualized colonic loops appear decompressed. The appendix is not seen. No pneumatosis. Vascular/Lymphatic: Aorta and IVC are normal in in size. There are atherosclerotic calcifications of the aorta. Vascular stent is seen in the left common iliac artery. No discrete enlarged lymph nodes are identified. Reproductive: Uterus appears small and unchanged. Adnexa are unremarkable. Other: There is a new small amount of free air in the anterior abdomen which may be related to recent surgery. Enhancing peritoneal fluid collections are seen in the bilateral pericolic gutters, new from prior. Left-sided collection measures 3.6 x 5.8 by 10 cm. Right-sided collection measures 4.7 by 2.1 by 8.6 cm. Small areas of enhancing peritoneal fluid are noted in the lower anterior pelvis. There is some tethering of small bowel loops in the anterior pelvis similar to prior. There is new midline abdominal wall skin staple line and subcutaneous edema. Medication injection site noted in the left anterior abdomen. Musculoskeletal: No acute fracture. IMPRESSION: 1. New right-sided ostomy. Bowel loops leading to the ostomy demonstrates diffuse wall thickening with surrounding inflammation worrisome for nonspecific enteritis. Small bowel proximal to this level is dilated with air-fluid levels worrisome for small bowel obstruction. 2. New enhancing peritoneal fluid collections in the  bilateral pericolic gutters and lower anterior pelvis worrisome for abscesses. 3. New subcapsular enhancing fluid collection along the inferior left margin of the liver worrisome for abscess. 4. New small amount of free air in the anterior abdomen may be related to recent surgery. 5. New trace left pleural effusion. 6. Diffuse bladder wall thickening with surrounding inflammation worrisome for cystitis. There is a small amount of air in the bladder which may be related to recent instrumentation. 7. Gastric antral wall thickening worrisome for gastritis. 8. Cholelithiasis. 9. Aortic atherosclerosis. Aortic Atherosclerosis (ICD10-I70.0). Electronically Signed   By: Greig Pique M.D.   On: 01/15/2024 17:36   DG Chest Port 1 View Result Date: 01/07/2024 CLINICAL DATA:  [id number] Encounter for nasogastric (NG) tube placement [id number] EXAM: PORTABLE CHEST 1 VIEW COMPARISON:  Chest x-ray 02/16/2022, CT chest 03/06/2022 FINDINGS: Enteric tube with tip overlying the gastric lumen and side port overlying the expected region of the distal esophageal lumen. Lower lung zones are visualized and grossly unremarkable. Upper lung zones are  collimated off view. Nonobstructive bowel gas pattern. No acute osseous abnormality. Multiple calcified stone within the right upper quadrant consistent with cholelithiasis. IMPRESSION: 1. Enteric tube with tip overlying the gastric lumen and side port overlying the expected region of the distal esophageal lumen. Recommend advancing by 8 cm. 2. Cholelithiasis. Electronically Signed   By: Morgane  Naveau M.D.   On: 01/07/2024 19:03   CT ABDOMEN PELVIS W CONTRAST Result Date: 01/06/2024 CLINICAL DATA:  Abdominal pain with nausea and vomiting two days ago. History of ovarian cancer. EXAM: CT ABDOMEN AND PELVIS WITH CONTRAST TECHNIQUE: Multidetector CT imaging of the abdomen and pelvis was performed using the standard protocol following bolus administration of intravenous contrast. RADIATION DOSE  REDUCTION: This exam was performed according to the departmental dose-optimization program which includes automated exposure control, adjustment of the mA and/or kV according to patient size and/or use of iterative reconstruction technique. CONTRAST:  OMNIPAQUE  IOHEXOL  300 MG/ML  SOLN COMPARISON:  11/04/2023 FINDINGS: Lower chest: Heart is normal size.  Lung bases are clear. Hepatobiliary: Liver and biliary tree are normal. There is moderate cholelithiasis. Pancreas: Normal. Spleen: Normal. Adrenals/Urinary Tract: Adrenal glands are normal. Kidneys are normal in size without hydronephrosis or nephrolithiasis. Stable left renal cyst. Ureters are normal. Mild hazy bladder wall thickening circumferentially unchanged. Stomach/Bowel: Stomach is normal. Evidence of multiple persistent fluid-filled dilated small bowel loops measuring up to 4.8 cm in diameter with slight interval worsening. This appears to be due to a distal obstruction with transition point likely over the midline pelvis involving an ileal loop. Air is present throughout the colon. Appendix not definitely visualized. Vascular/Lymphatic: Abdominal aorta is normal in caliber with minimal calcified plaque distally. Remaining vascular structures are unremarkable. No adenopathy. Reproductive: Small ill-defined uterus containing peripherally enhancing 2.1 cm mass likely uterine fibroid as these findings are unchanged. Adnexal regions unremarkable. Other: Worsening mild free fluid over the upper abdomen with minimal free fluid adjacent the bladder. Acute evidence of small anterior peritoneal implants without significant change several small masses within the subcutaneous fat of the anterior lower abdominal/pelvic wall which are new as cannot exclude metastatic disease. Small ventral hernia right of midline over the mid to upper abdomen unchanged and containing only peritoneal fat. Periumbilical hernia less prominent containing partial segment of small  bowel. Musculoskeletal: No focal abnormality. IMPRESSION: 1. Evidence of multiple persistent fluid-filled dilated small bowel loops measuring up to 4.8 cm in diameter with slight interval worsening. This appears to be due to a distal obstruction with transition point likely over the midline pelvis involving an ileal loop likely due to adhesions. 2. Worsening mild free fluid over the upper abdomen with minimal stable free fluid adjacent the bladder. 3. Evidence of small anterior peritoneal implants without significant change. Several small masses within the subcutaneous fat of the anterior lower abdominal/pelvic wall which are new and may be additional sites of metastatic disease in this patient with known ovarian cancer. 4. Cholelithiasis. 5. Stable left renal cyst. 6. Small ventral hernia right of midline over the mid to upper abdomen unchanged and containing only peritoneal fat. Periumbilical hernia less prominent containing partial segment of small bowel. 7. Aortic atherosclerosis. Aortic Atherosclerosis (ICD10-I70.0). Electronically Signed   By: Toribio Agreste M.D.   On: 01/06/2024 16:09    Microbiology: Results for orders placed or performed during the hospital encounter of 01/06/24  MRSA Next Gen by PCR, Nasal     Status: None   Collection Time: 01/17/24 10:18 AM   Specimen: Nasal Mucosa; Nasal  Swab  Result Value Ref Range Status   MRSA by PCR Next Gen NOT DETECTED NOT DETECTED Final    Comment: (NOTE) The GeneXpert MRSA Assay (FDA approved for NASAL specimens only), is one component of a comprehensive MRSA colonization surveillance program. It is not intended to diagnose MRSA infection nor to guide or monitor treatment for MRSA infections. Test performance is not FDA approved in patients less than 85 years old. Performed at [facility], [address], [place], [name] [address]   Aerobic Culture w Gram Stain (superficial specimen)     Status: None (Preliminary result)   Collection Time:  01/19/24  4:17 PM   Specimen: Incision; Wound  Result Value Ref Range Status   Specimen Description   Final    INCISION Performed at [facility], [address], [place], [name] [address]    Special Requests   Final    Immunocompromised Performed at [facility], [address] [place], [name] [address]    Gram Stain   Final    MODERATE WBC PRESENT, PREDOMINANTLY PMN ABUNDANT GRAM POSITIVE COCCI    Culture   Final    FEW PROTEUS SPECIES CULTURE REINCUBATED FOR BETTER GROWTH Performed at [facility], [address], [place], [name] [address]    Report Status PENDING  Incomplete   Organism ID, Bacteria PROTEUS SPECIES  Final      Susceptibility   Proteus species - MIC*    AMPICILLIN >=32 RESISTANT Resistant     CEFEPIME <=0.12 SENSITIVE Sensitive     ERTAPENEM <=0.12 SENSITIVE Sensitive     CEFTRIAXONE  <=0.25 SENSITIVE Sensitive     CIPROFLOXACIN  <=0.06 SENSITIVE Sensitive     GENTAMICIN <=1 SENSITIVE Sensitive     MEROPENEM <=0.25 SENSITIVE Sensitive     TRIMETH /SULFA  <=20 SENSITIVE Sensitive     AMPICILLIN/SULBACTAM 8 SENSITIVE Sensitive     PIP/TAZO Value in next row Sensitive ug/mL     <=4 SENSITIVEThis is a modified FDA-approved test that has been validated and its performance characteristics determined by the reporting laboratory.  This laboratory is certified under the Clinical Laboratory Improvement Amendments CLIA as qualified to perform high complexity clinical laboratory testing.    * FEW PROTEUS SPECIES    Labs: CBC: Recent Labs  Lab 01/17/24 0205 01/18/24 0416 01/19/24 0609 01/20/24 0517 01/21/24 0524  WBC 8.5 7.9 6.3 6.6 7.2  HGB 11.3* 12.2 11.2* 11.3* 10.4*  HCT 35.0* 37.8 34.6* 34.4* 31.8*  MCV 102.3* 102.7* 104.2* 103.0* 103.9*  PLT 46* 43* 38* 31* 27*   Basic Metabolic Panel: Recent Labs  Lab 01/17/24 0205 01/18/24 0416 01/19/24 0609 01/20/24 0517 01/21/24 0524  NA 131* 131* 131* 128* 130*  K 3.5 3.3* 5.0 4.3 5.1  CL 94* 96* 96*  94* 95*  CO2 24 24 25 25 27   GLUCOSE 141* 114* 115* 96 110*  BUN 15 12 9 9 11   CREATININE 0.86 0.78 0.69 0.60 0.62  CALCIUM  7.4* 7.7* 7.8* 7.6* 7.6*  MG  --  2.2  --   --   --    Liver Function Tests: Recent Labs  Lab 01/17/24 0205 01/18/24 0416 01/19/24 0609 01/20/24 0517 01/21/24 0524  AST 50* 39 35 39 42*  ALT 34 28 21 18 16   ALKPHOS 80 87 82 78 86  BILITOT 1.9* 1.8* 1.6* <0.2 1.5*  PROT 4.9* 5.6* 5.2* 5.2* 5.3*  ALBUMIN <1.5* 1.6* <1.5* <1.5* <1.5*   CBG: Recent Labs  Lab 01/22/24 1613 01/22/24 2009 01/22/24 2322  01/23/24 0409 01/23/24 0736  GLUCAP 104* 95 93 95 87    Discharge time spent: greater than 30 minutes.  Signed: Alm Schneider, MD [facility] 01/23/2024

## 2024-01-23 NOTE — Progress Notes (Signed)
 Daily Progress Note   Patient Name: Nicole Santos       Date: 01/23/2024 DOB: 02-Nov-1970  Age: 53 y.o. MRN#: [id number] Attending Physician: Evonnie Lenis, MD Primary Care Physician: Jolinda Norene HERO, DO Admit Date: 01/06/2024  Reason for Consultation/Follow-up: Disposition  Subjective:   53 y.o. female  with past medical history of ovarian cancer (11/14/2021), peritoneal carcinomatosis, history of cervical cancer (2000), sleep apnea (not on CPAP), hypertension, depression, anxiety, history of PE, history of diabetes that resolved with weight loss admitted on 01/06/2024 with intractable nausea and vomiting.   Recurrent hospitalizations for SBO (most recent 11/2023). Managed conservatively. Symptoms improved with bowel rest.  She was discharged home.  She was followed by her GYN oncologist and medical oncologist in the interim.  Unfortunately, she had a worsening in her abdominal pain as well as some associated nausea and vomiting and was readmitted on 01/06/2024 she underwent exploratory laparotomy on 01/07/2024.  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  Surgical team unable to release any of her small bowel that was adherent to her pelvis and as such an end ileostomy was placed.  Noted to have numerous nodules throughout her abdomen and significant peritoneal carcinomatosis.  Admitted to the floor for further management and pain control.  Surgery recommends palliative referral for goals of care.   01/08/2024: Goals of care discussion completed.  Patient declines to make any changes to her advance directives at this time.  She is hopeful to hear from her oncology team about treatment options.  She also does not wish to make any changes to her advance directives without first discussing with her entire family.  She will let us  know  if she desires family meeting but declined need for 1 at this time.  We adjusted her pain regimen due to end of dose breakthrough pain.   01/09/2024: Symptom management reviewed. Symptoms stable/improving. Not interested in GOC/ACP discussion until able to speak with oncology.    01/10/2024: Followed up for ongoing goals of care and symptom management.  She continued to require IV opioids for pain control.  Nausea and GERD symptoms had improved.  She declined to update advanced directives but would consider outpatient palliative referral.  Will let me know if she decides to proceed with this.   01/14/2024: Continue to require IV opioids for pain management.  Continue to be followed by surgery and admitting team.  Goals of care and advance care planning discussion attempted.  Patient declined to make any changes to her advance directive at that time.  Agreeable to outpatient palliative referral.   01/17/2024:  GOC/ACP discussion completed x 3.  Extensive education provided regarding her concerns about patient's continued deterioration especially in the context of her terminal cancer diagnosis.  Patient did not wish to make any decisions or updates as it relates to her advance directives.   01/20/2024: Met with patient, symptom regimen adjusted for better pain control.  Attempted advance care planning/goals of care discussion.  Patient shared again her goal is to return home.  Declined to make any changes to her advance directives or any further decisions regarding her care.   01/21/2024: extensive GOC/ACP discussion. Patient's goal is to return home. Decision was made to return home with hospice support. [facility] hosp[facility] is involved and working on plan for discharge.    01/22/2024: Continue goals of care/advance care planning discussions.  Coordinated with care team and outpatient hospice services on formulating an appropriate discharge plan.  Today, patient seen and examined in anticipation of  discharge.  She does not wish to speak much but does state she is ready to go home.  Feels pain control is reasonable with oral Dilaudid  (has had 3 mg of oral Dilaudid  on 24-hour look back).  I spoke at length with patient's husband regarding plan.  He states that everything is set up and feels they are ready to be discharged.  Also discussed discontinuing noncomfort medications such as Lipitor.  Provided additional education about hospice and their services including social work as he has some financial planning to do with patient.  Chart review/care coordination:  Completed extensive chart review including EPIC notes, vital signs, and medication administration record. Coordinated care with bedside nursing staff, attending physician, and TOC   Length of Stay: 16   Physical Exam Constitutional:      General: She is not in acute distress.    Comments: Chronically ill-appearing  Pulmonary:     Effort: Pulmonary effort is normal. No respiratory distress.  Skin:    General: Skin is warm and dry.  Neurological:     Mental Status: She is alert.  Psychiatric:        Behavior: Behavior is withdrawn.             Vital Signs: BP 101/69   Pulse 99   Temp 98 F (36.7 C) (Oral)   Resp 20   Ht 5' 2 (1.575 m)   Wt 79.9 kg   SpO2 100%   BMI 32.22 kg/m  SpO2: SpO2: 100 % O2 Device: O2 Device: Room Air O2 Flow Rate:        Palliative Assessment/Data:40%   Palliative Care Assessment & Plan   Patient Profile/Assessment:  53 y.o. female with past medical history of ovarian cancer (11/14/2021), peritoneal carcinomatosis, history of cervical cancer (2000), sleep apnea (not on CPAP), hypertension, depression, anxiety, history of PE, history of diabetes that resolved with weight loss admitted on 01/06/2024 with intractable nausea and vomiting. Diagnosed with recurrent small bowel obstruction (most recently hospitalized 6/25 with the same, managed conservatively). Underwent exploratory laparotomy  on 01/07/2024. 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 As such, a palliative end ileostomy was placed. Numerous goals of care and advance care planning discussion completed given acute decline in health status, presence of terminal cancer, and poor prognosis. Her goal is to return home with her family.  Ultimately, decision was made to discharge home with hospice support.  She was transition to oral opioids.  Appropriate DME has been ordered and delivered.  Patient's husband picked up her  medications including her symptom medication and has them on hand.  Hospice nurse plans to admit this afternoon.  Extensive discharge education completed with patient and her husband today.   Recommendations/Plan:  Full code/full scope- pt continuously declines to discuss or update advanced directives but agreeable to home with hospice, both patient and husband have been made aware on numerous occasions about limitations and potential burdens of CPR and intubation, particularly in the context of those with serious underlying health conditions Symptom regimen prescribed at discharge  DME in place (wound VAC/hospital bed) Discharge home with home hospice   Symptom management:  Continue Tylenol  1000 mg p.o. every 8 hours Continue Robaxin  500 mg p.o. every 8 hours Continue hydromorphone  1 mg tablet every 4 hours as needed  Prognosis:  < 3 months    Discharge Planning: Home with Hospice    Detailed review of medical records (labs, imaging, vital signs), medically appropriate exam, discussed with treatment team, counseling and education to patient, family, & staff, documenting clinical information, medication management, coordination of care   Total time: I spent 45 minutes in the care of the patient today in the above activities and documenting the encounter.   Laymon CHRISTELLA Pinal, NP  Palliative Medicine Team Team phone # [phone]  Thank you for  allowing the Palliative Medicine Team to assist in the care of this patient. Please utilize secure chat with additional questions, if there is no response within 30 minutes please call the above phone number.  Palliative Medicine Team providers are available by phone from 7am to 7pm daily and can be reached through the team cell phone.  Should this patient require assistance outside of these hours, please call the patient's attending physician.

## 2024-01-23 NOTE — Progress Notes (Signed)
 Patient alert and oriented x3-4. PRN Pain medication given prior to removing wound vac dressing and placing wet to dry dressing with abd pad in place until hospice RN to visit patient home early this afternoon. Dressing change, along with bottle of sterile water provided to patient husband just in case. Palliative NP went over discharge summary and medication education with patient husband who expressed full understanding with teach back to writer also. Port de-accessed per protocol with no signs or symptoms of adverse effects. Port site clean, dry and intact. Ostomy emptied prior to discharge. Patient transferred from bed to wheelchair with contact guard. Patient discharged with all belongings for home via car.

## 2024-01-23 NOTE — TOC Transition Note (Signed)
 Transition of Care [facility]) - Discharge Note   Patient Details  Name: Nicole Santos MRN: [id number] Date of Birth: 10/29/1970  Transition of Care [facility]) CM/SW Contact:  Lucie Lunger, LCSWA Phone Number: 01/23/2024, 9:50 AM   Clinical Narrative:    CSW updated that [facility] will have a nurse out to pts home at 2pm. CSW updated treatment team of this and that pt can be discharged when RN is ready. MD completed D/C already. Pts spouse plans to provide transportation for pt. TOC signing off.   Final next level of care: Home w Hospice Care Barriers to Discharge: Barriers Resolved   Patient Goals and CMS Choice Patient states their goals for this hospitalization and ongoing recovery are:: return home with hospice CMS Medicare.gov Compare Post Acute Care list provided to:: Patient Choice offered to / list presented to : Patient, Spouse      Discharge Placement                       Discharge Plan and Services Additional resources added to the After Visit Summary for                            [facility] Arranged: Patient Refused HH          Social Drivers of Health (SDOH) Interventions SDOH Screenings   Food Insecurity: No Food Insecurity (01/06/2024)  Housing: Low Risk  (01/06/2024)  Transportation Needs: No Transportation Needs (01/06/2024)  Utilities: Not At Risk (01/06/2024)  Depression (PHQ2-9): Low Risk  (01/01/2024)  Tobacco Use: Low Risk  (01/07/2024)     Readmission Risk Interventions    11/06/2023    9:51 AM  Readmission Risk Prevention Plan  Transportation Screening Complete  Home Care Screening Complete  Medication Review (RN CM) Complete

## 2024-01-24 ENCOUNTER — Telehealth: Payer: Self-pay | Admitting: *Deleted

## 2024-01-24 LAB — AEROBIC CULTURE W GRAM STAIN (SUPERFICIAL SPECIMEN)

## 2024-01-24 NOTE — Patient Outreach (Signed)
 01/24/2024  Patient ID: Nicole Santos, female   DOB: December 15, 1970, 53 y.o.   MRN: [id number]  Telephone call from [facility] who reports patient was admitted to services of [facility] due to patient has a wound vac and [facility] does not provide that service, verified pt was admitted to [facility] services in the home as the admission nurse was in the home when [place] spoke with patient's family.  Mliss Creed [facility], BSN RN Care Manager/ Transition of Care [place]/ [facility] [phone]

## 2024-01-24 NOTE — Transitions of Care (Post Inpatient/ED Visit) (Signed)
   01/24/2024  Name: Nicole Santos MRN: [id number] DOB: [birth year]  Today's TOC FU Call Status: Today's TOC FU Call Status:: Unsuccessful Call (1st Attempt) Unsuccessful Call (1st Attempt) Date: 01/24/24  Attempted to reach the patient regarding the most recent Inpatient/ED visit.  Spoke with [place] at [facility], she will check to see if pt was admitted to services and call RN CM back.  Follow Up Plan: Additional outreach attempts will be made to reach the patient to complete the Transitions of Care (Post Inpatient/ED visit) call.   Mliss Creed [facility], BSN RN Care Manager/ Transition of Care [place]/ [facility] [phone]

## 2024-01-27 ENCOUNTER — Telehealth: Payer: Self-pay | Admitting: *Deleted

## 2024-01-27 NOTE — Telephone Encounter (Signed)
 Received call from [place], Hospice SN with [facility]. [phone]~ telephone   Reports that patient has new wound above wound vac that measures 1 cm long by 1.5 cm wide by 4 cm deep. States that patient still has staples in this area. Requested authorization to remove staples in area of concern and bridge wound vac to new open area.   Discussed with Dr. Evonnie. Reports that all staples may be removed if areas appear to be healing. Advised if area appears it will open after staple removal to leave in.   Inquired as to how much drainage is noted in wound vac. Hospice nurse reports that canister was placed new on Thursday and then changed on Sunday (500 mL's), approximately 100- 200 mL's per day.  Post op appointment canceled as hospice nurse reports that patient is sleeping a lot and is not eating well. Wound care per comfort measures.

## 2024-01-29 ENCOUNTER — Encounter: Admitting: Surgery

## 2024-01-29 ENCOUNTER — Other Ambulatory Visit: Payer: Self-pay | Admitting: *Deleted

## 2024-03-03 ENCOUNTER — Inpatient Hospital Stay

## 2024-03-03 ENCOUNTER — Ambulatory Visit (HOSPITAL_COMMUNITY)

## 2024-03-10 ENCOUNTER — Inpatient Hospital Stay: Admitting: Oncology

## 2024-03-30 ENCOUNTER — Encounter: Payer: Commercial Managed Care - PPO | Admitting: Family Medicine

## 2024-04-02 ENCOUNTER — Inpatient Hospital Stay: Admitting: Gynecologic Oncology

## 2024-04-04 DEATH — deceased
